# Patient Record
Sex: Female | Born: 1950
Health system: Southern US, Community
[De-identification: ages and names within clinical notes are randomized; demographics above are authoritative.]

## PROBLEM LIST (undated history)

## (undated) DIAGNOSIS — R0600 Dyspnea, unspecified: Secondary | ICD-10-CM

## (undated) DIAGNOSIS — E049 Nontoxic goiter, unspecified: Secondary | ICD-10-CM

## (undated) DIAGNOSIS — D649 Anemia, unspecified: Secondary | ICD-10-CM

## (undated) DIAGNOSIS — E119 Type 2 diabetes mellitus without complications: Secondary | ICD-10-CM

## (undated) DIAGNOSIS — E785 Hyperlipidemia, unspecified: Secondary | ICD-10-CM

## (undated) DIAGNOSIS — F419 Anxiety disorder, unspecified: Secondary | ICD-10-CM

## (undated) DIAGNOSIS — G4700 Insomnia, unspecified: Secondary | ICD-10-CM

## (undated) DIAGNOSIS — I219 Acute myocardial infarction, unspecified: Secondary | ICD-10-CM

## (undated) DIAGNOSIS — J9600 Acute respiratory failure, unspecified whether with hypoxia or hypercapnia: Secondary | ICD-10-CM

## (undated) DIAGNOSIS — J440 Chronic obstructive pulmonary disease with acute lower respiratory infection: Secondary | ICD-10-CM

## (undated) DIAGNOSIS — M86179 Other acute osteomyelitis, unspecified ankle and foot: Secondary | ICD-10-CM

## (undated) DIAGNOSIS — E1165 Type 2 diabetes mellitus with hyperglycemia: Secondary | ICD-10-CM

## (undated) DIAGNOSIS — I25119 Atherosclerotic heart disease of native coronary artery with unspecified angina pectoris: Secondary | ICD-10-CM

## (undated) DIAGNOSIS — J189 Pneumonia, unspecified organism: Secondary | ICD-10-CM

## (undated) DIAGNOSIS — N951 Menopausal and female climacteric states: Secondary | ICD-10-CM

## (undated) DIAGNOSIS — J441 Chronic obstructive pulmonary disease with (acute) exacerbation: Secondary | ICD-10-CM

## (undated) DIAGNOSIS — F32A Depression, unspecified: Secondary | ICD-10-CM

## (undated) DIAGNOSIS — I214 Non-ST elevation (NSTEMI) myocardial infarction: Secondary | ICD-10-CM

## (undated) DIAGNOSIS — M792 Neuralgia and neuritis, unspecified: Secondary | ICD-10-CM

## (undated) DIAGNOSIS — F1910 Other psychoactive substance abuse, uncomplicated: Secondary | ICD-10-CM

## (undated) DIAGNOSIS — N3281 Overactive bladder: Secondary | ICD-10-CM

## (undated) DIAGNOSIS — J209 Acute bronchitis, unspecified: Secondary | ICD-10-CM

## (undated) DIAGNOSIS — J449 Chronic obstructive pulmonary disease, unspecified: Secondary | ICD-10-CM

## (undated) DIAGNOSIS — F329 Major depressive disorder, single episode, unspecified: Secondary | ICD-10-CM

## (undated) DIAGNOSIS — M549 Dorsalgia, unspecified: Secondary | ICD-10-CM

## (undated) DIAGNOSIS — M545 Low back pain, unspecified: Secondary | ICD-10-CM

## (undated) DIAGNOSIS — I2510 Atherosclerotic heart disease of native coronary artery without angina pectoris: Secondary | ICD-10-CM

## (undated) DIAGNOSIS — I119 Hypertensive heart disease without heart failure: Secondary | ICD-10-CM

## (undated) DIAGNOSIS — K219 Gastro-esophageal reflux disease without esophagitis: Secondary | ICD-10-CM

## (undated) DIAGNOSIS — M1990 Unspecified osteoarthritis, unspecified site: Secondary | ICD-10-CM

## (undated) DIAGNOSIS — F17200 Nicotine dependence, unspecified, uncomplicated: Secondary | ICD-10-CM

## (undated) DIAGNOSIS — I509 Heart failure, unspecified: Secondary | ICD-10-CM

## (undated) DIAGNOSIS — R0902 Hypoxemia: Secondary | ICD-10-CM

## (undated) DIAGNOSIS — I10 Essential (primary) hypertension: Secondary | ICD-10-CM

## (undated) DIAGNOSIS — J40 Bronchitis, not specified as acute or chronic: Secondary | ICD-10-CM

## (undated) DIAGNOSIS — N816 Rectocele: Secondary | ICD-10-CM

## (undated) DIAGNOSIS — E1151 Type 2 diabetes mellitus with diabetic peripheral angiopathy without gangrene: Secondary | ICD-10-CM

## (undated) DIAGNOSIS — R569 Unspecified convulsions: Secondary | ICD-10-CM

## (undated) DIAGNOSIS — E039 Hypothyroidism, unspecified: Secondary | ICD-10-CM

## (undated) DIAGNOSIS — R42 Dizziness and giddiness: Secondary | ICD-10-CM

## (undated) DIAGNOSIS — R609 Edema, unspecified: Secondary | ICD-10-CM

## (undated) DIAGNOSIS — I255 Ischemic cardiomyopathy: Secondary | ICD-10-CM

## (undated) HISTORY — DX: Insomnia, unspecified: G47.00

## (undated) HISTORY — DX: Depression, unspecified: F32.A

## (undated) HISTORY — DX: Hyperlipidemia, unspecified: E78.5

## (undated) HISTORY — DX: Hypoxemia: R09.02

## (undated) HISTORY — DX: Acute bronchitis, unspecified: J20.9

## (undated) HISTORY — DX: Chronic obstructive pulmonary disease, unspecified: J44.9

## (undated) HISTORY — DX: Unspecified convulsions: R56.9

## (undated) HISTORY — DX: Major depressive disorder, single episode, unspecified: F32.9

## (undated) HISTORY — DX: Hypertensive heart disease without heart failure: I11.9

## (undated) HISTORY — DX: Menopausal and female climacteric states: N95.1

## (undated) HISTORY — DX: Chronic obstructive pulmonary disease with (acute) exacerbation: J44.1

## (undated) HISTORY — DX: Chronic obstructive pulmonary disease with (acute) lower respiratory infection: J44.0

## (undated) HISTORY — DX: Type 2 diabetes mellitus without complications: E11.9

## (undated) HISTORY — DX: Dizziness and giddiness: R42

## (undated) HISTORY — DX: Hypothyroidism, unspecified: E03.9

## (undated) HISTORY — DX: Anxiety disorder, unspecified: F41.9

## (undated) HISTORY — DX: Type 2 diabetes mellitus with hyperglycemia: E11.65

## (undated) HISTORY — DX: Overactive bladder: N32.81

## (undated) HISTORY — DX: Type 2 diabetes mellitus with diabetic peripheral angiopathy without gangrene: E11.51

## (undated) HISTORY — DX: Neuralgia and neuritis, unspecified: M79.2

## (undated) HISTORY — DX: Unspecified osteoarthritis, unspecified site: M19.90

## (undated) HISTORY — DX: Edema, unspecified: R60.9

## (undated) HISTORY — DX: Low back pain, unspecified: M54.50

## (undated) HISTORY — DX: Rectocele: N81.6

## (undated) HISTORY — DX: Other psychoactive substance abuse, uncomplicated: F19.10

## (undated) HISTORY — DX: Atherosclerotic heart disease of native coronary artery with unspecified angina pectoris: I25.119

## (undated) HISTORY — DX: Other acute osteomyelitis, unspecified ankle and foot: M86.179

## (undated) HISTORY — PX: TUBAL LIGATION: SHX77

## (undated) HISTORY — DX: Nicotine dependence, unspecified, uncomplicated: F17.200

---
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
## 2002-07-27 ENCOUNTER — Emergency Department (HOSPITAL_COMMUNITY): Admission: EM | Admit: 2002-07-27 | Discharge: 2002-07-27 | Payer: Self-pay | Admitting: Emergency Medicine

## 2004-09-18 ENCOUNTER — Emergency Department (HOSPITAL_COMMUNITY): Admission: EM | Admit: 2004-09-18 | Discharge: 2004-09-18 | Payer: Self-pay | Admitting: Emergency Medicine

## 2006-11-24 ENCOUNTER — Emergency Department (HOSPITAL_COMMUNITY): Admission: EM | Admit: 2006-11-24 | Discharge: 2006-11-24 | Payer: Self-pay | Admitting: Emergency Medicine

## 2008-08-11 ENCOUNTER — Emergency Department (HOSPITAL_COMMUNITY): Admission: EM | Admit: 2008-08-11 | Discharge: 2008-08-11 | Payer: Self-pay | Admitting: Emergency Medicine

## 2008-09-24 ENCOUNTER — Emergency Department (HOSPITAL_COMMUNITY): Admission: EM | Admit: 2008-09-24 | Discharge: 2008-09-25 | Payer: Self-pay | Admitting: Emergency Medicine

## 2008-09-30 ENCOUNTER — Ambulatory Visit (HOSPITAL_COMMUNITY): Admission: RE | Admit: 2008-09-30 | Discharge: 2008-09-30 | Payer: Self-pay | Admitting: Family Medicine

## 2009-01-14 ENCOUNTER — Emergency Department (HOSPITAL_COMMUNITY): Admission: EM | Admit: 2009-01-14 | Discharge: 2009-01-15 | Payer: Self-pay | Admitting: Emergency Medicine

## 2009-01-25 ENCOUNTER — Emergency Department (HOSPITAL_COMMUNITY): Admission: EM | Admit: 2009-01-25 | Discharge: 2009-01-25 | Payer: Self-pay | Admitting: Emergency Medicine

## 2009-03-03 ENCOUNTER — Emergency Department (HOSPITAL_COMMUNITY): Admission: EM | Admit: 2009-03-03 | Discharge: 2009-03-03 | Payer: Self-pay | Admitting: Emergency Medicine

## 2009-03-19 ENCOUNTER — Emergency Department (HOSPITAL_COMMUNITY): Admission: EM | Admit: 2009-03-19 | Discharge: 2009-03-19 | Payer: Self-pay | Admitting: Emergency Medicine

## 2009-10-02 ENCOUNTER — Ambulatory Visit (HOSPITAL_COMMUNITY): Admission: RE | Admit: 2009-10-02 | Discharge: 2009-10-02 | Payer: Self-pay | Admitting: Family Medicine

## 2009-10-27 ENCOUNTER — Emergency Department (HOSPITAL_COMMUNITY): Admission: EM | Admit: 2009-10-27 | Discharge: 2009-10-27 | Payer: Self-pay | Admitting: Emergency Medicine

## 2009-11-03 ENCOUNTER — Emergency Department (HOSPITAL_COMMUNITY): Admission: EM | Admit: 2009-11-03 | Discharge: 2009-11-03 | Payer: Self-pay | Admitting: Emergency Medicine

## 2009-12-31 ENCOUNTER — Emergency Department (HOSPITAL_COMMUNITY): Admission: EM | Admit: 2009-12-31 | Discharge: 2009-12-31 | Payer: Self-pay | Admitting: Emergency Medicine

## 2010-01-08 ENCOUNTER — Inpatient Hospital Stay (HOSPITAL_COMMUNITY): Admission: EM | Admit: 2010-01-08 | Discharge: 2010-01-11 | Payer: Self-pay | Admitting: Emergency Medicine

## 2010-01-08 ENCOUNTER — Encounter: Payer: Self-pay | Admitting: Internal Medicine

## 2010-01-08 LAB — CONVERTED CEMR LAB
BUN: 12 mg/dL
Basophils Relative: 0 %
CO2: 27 meq/L
Chloride: 104 meq/L
Creatinine, Ser: 0.98 mg/dL
Eosinophils Relative: 1 %
Glucose, Bld: 197 mg/dL
HCT: 40.2 %
Hemoglobin: 13.5 g/dL
Lymphocytes, automated: 23 %
MCV: 86.9 fL
Monocytes Relative: 7 %
Neutrophils Relative %: 69 %
Platelets: 176 10*3/uL
Potassium: 3 meq/L
RBC: 4.63 M/uL
RDW: 14.3 %
Sodium: 137 meq/L
WBC: 7.9 10*3/uL

## 2010-01-09 ENCOUNTER — Encounter: Payer: Self-pay | Admitting: Internal Medicine

## 2010-01-09 LAB — CONVERTED CEMR LAB
BUN: 9 mg/dL
Basophils Relative: 0 %
CO2: 20 meq/L
Calcium: 8.3 mg/dL
Chloride: 20 meq/L
Creatinine, Ser: 0.96 mg/dL
Eosinophils Relative: 0 %
Glucose, Bld: 214 mg/dL
HCT: 38 %
Hemoglobin: 12.9 g/dL
Hgb A1c MFr Bld: 5.7 %
Lymphocytes, automated: 6 %
MCV: 87.2 fL
Monocytes Relative: 1 %
Neutrophils Relative %: 93 %
Platelets: 188 10*3/uL
Potassium: 3.1 meq/L
RBC: 4.36 M/uL
RDW: 14.4 %
Sodium: 135 meq/L
TSH: 0.982 microintl units/mL
WBC: 7.9 10*3/uL

## 2010-01-10 ENCOUNTER — Encounter: Payer: Self-pay | Admitting: Internal Medicine

## 2010-01-10 LAB — CONVERTED CEMR LAB
BUN: 8 mg/dL
Basophils Relative: 0 %
CO2: 24 meq/L
Calcium: 8.9 mg/dL
Chloride: 108 meq/L
Creatinine, Ser: 0.78 mg/dL
Eosinophils Relative: 0 %
Glucose, Bld: 162 mg/dL
HCT: 38.9 %
Hemoglobin: 13 g/dL
Lymphocytes, automated: 6 %
MCV: 87.6 fL
Monocytes Relative: 2 %
Neutrophils Relative %: 93 %
Platelets: 188 10*3/uL
Potassium: 4.6 meq/L
RBC: 4.44 M/uL
RDW: 14.8 %
Sodium: 138 meq/L
WBC: 14.6 10*3/uL

## 2010-02-04 ENCOUNTER — Encounter: Payer: Self-pay | Admitting: Internal Medicine

## 2010-02-04 DIAGNOSIS — E876 Hypokalemia: Secondary | ICD-10-CM | POA: Insufficient documentation

## 2010-02-04 DIAGNOSIS — G8929 Other chronic pain: Secondary | ICD-10-CM | POA: Insufficient documentation

## 2010-02-04 DIAGNOSIS — J439 Emphysema, unspecified: Secondary | ICD-10-CM | POA: Insufficient documentation

## 2010-02-04 DIAGNOSIS — F3289 Other specified depressive episodes: Secondary | ICD-10-CM | POA: Insufficient documentation

## 2010-02-04 DIAGNOSIS — F329 Major depressive disorder, single episode, unspecified: Secondary | ICD-10-CM | POA: Insufficient documentation

## 2010-02-04 DIAGNOSIS — F411 Generalized anxiety disorder: Secondary | ICD-10-CM | POA: Insufficient documentation

## 2010-02-04 DIAGNOSIS — F32A Depression, unspecified: Secondary | ICD-10-CM | POA: Insufficient documentation

## 2010-03-20 ENCOUNTER — Emergency Department (HOSPITAL_COMMUNITY): Admission: EM | Admit: 2010-03-20 | Discharge: 2010-03-20 | Payer: Self-pay | Admitting: Emergency Medicine

## 2010-06-19 ENCOUNTER — Emergency Department (HOSPITAL_COMMUNITY)
Admission: EM | Admit: 2010-06-19 | Discharge: 2010-06-19 | Disposition: A | Discharge status: Home / Self Care | Payer: Medicare Other | Attending: Emergency Medicine | Admitting: Emergency Medicine

## 2010-06-19 DIAGNOSIS — F17200 Nicotine dependence, unspecified, uncomplicated: Secondary | ICD-10-CM | POA: Insufficient documentation

## 2010-06-19 DIAGNOSIS — E7800 Pure hypercholesterolemia, unspecified: Secondary | ICD-10-CM | POA: Insufficient documentation

## 2010-06-19 DIAGNOSIS — J4489 Other specified chronic obstructive pulmonary disease: Secondary | ICD-10-CM | POA: Insufficient documentation

## 2010-06-19 DIAGNOSIS — J449 Chronic obstructive pulmonary disease, unspecified: Secondary | ICD-10-CM | POA: Insufficient documentation

## 2010-06-19 DIAGNOSIS — F410 Panic disorder [episodic paroxysmal anxiety] without agoraphobia: Secondary | ICD-10-CM | POA: Insufficient documentation

## 2010-07-20 LAB — URINE CULTURE
Colony Count: 4000
Culture  Setup Time: 201111121943

## 2010-07-20 LAB — BASIC METABOLIC PANEL
BUN: 9 mg/dL (ref 6–23)
CO2: 27 mEq/L (ref 19–32)
Calcium: 9.1 mg/dL (ref 8.4–10.5)
Chloride: 103 mEq/L (ref 96–112)
Creatinine, Ser: 0.79 mg/dL (ref 0.4–1.2)
GFR calc Af Amer: >60 mL/min (ref >60)
GFR calc non Af Amer: >60 mL/min (ref >60)
Glucose, Bld: 103 mg/dL — ABNORMAL HIGH (ref 70–99)
Potassium: 3.7 mEq/L (ref 3.5–5.1)
Sodium: 138 mEq/L (ref 135–145)

## 2010-07-20 LAB — CBC
HCT: 41.6 % (ref 36.0–46.0)
Hemoglobin: 13.9 g/dL (ref 12.0–15.0)
MCH: 29.1 pg (ref 26.0–34.0)
MCHC: 33.4 g/dL (ref 30.0–36.0)
MCV: 86.9 fL (ref 78.0–100.0)
Platelets: 255 10*3/uL (ref 150–400)
RBC: 4.79 MIL/uL (ref 3.87–5.11)
RDW: 15.1 % (ref 11.5–15.5)
WBC: 7.5 10*3/uL (ref 4.0–10.5)

## 2010-07-20 LAB — URINALYSIS, ROUTINE W REFLEX MICROSCOPIC
Bilirubin Urine: NEGATIVE
Glucose, UA: NEGATIVE mg/dL
Ketones, ur: NEGATIVE mg/dL
Leukocytes, UA: NEGATIVE
Nitrite: NEGATIVE
Protein, ur: NEGATIVE mg/dL
Specific Gravity, Urine: 1.01 (ref 1.005–1.030)
Urobilinogen, UA: 0.2 mg/dL (ref 0.0–1.0)
pH: 5.5 (ref 5.0–8.0)

## 2010-07-20 LAB — URINE MICROSCOPIC-ADD ON

## 2010-07-20 LAB — GLUCOSE, CAPILLARY: Glucose-Capillary: 89 mg/dL (ref 70–99)

## 2010-07-20 LAB — DIFFERENTIAL
Basophils Absolute: 0 10*3/uL (ref 0.0–0.1)
Basophils Relative: 0 % (ref 0–1)
Eosinophils Absolute: 0.1 10*3/uL (ref 0.0–0.7)
Eosinophils Relative: 2 % (ref 0–5)
Lymphocytes Relative: 33 % (ref 12–46)
Lymphs Abs: 2.5 10*3/uL (ref 0.7–4.0)
Monocytes Absolute: 0.4 10*3/uL (ref 0.1–1.0)
Monocytes Relative: 6 % (ref 3–12)
Neutro Abs: 4.5 10*3/uL (ref 1.7–7.7)
Neutrophils Relative %: 60 % (ref 43–77)

## 2010-07-20 LAB — POCT CARDIAC MARKERS
CKMB, poc: <1 ng/mL — ABNORMAL LOW (ref 1.0–8.0)
Myoglobin, poc: 33.3 ng/mL (ref 12–200)
Troponin i, poc: <0.05 ng/mL (ref 0.00–0.09)

## 2010-07-21 ENCOUNTER — Emergency Department (HOSPITAL_COMMUNITY)
Admission: EM | Admit: 2010-07-21 | Discharge: 2010-07-21 | Disposition: A | Discharge status: Home / Self Care | Payer: Medicare Other | Attending: Emergency Medicine | Admitting: Emergency Medicine

## 2010-07-21 DIAGNOSIS — F3289 Other specified depressive episodes: Secondary | ICD-10-CM | POA: Insufficient documentation

## 2010-07-21 DIAGNOSIS — F329 Major depressive disorder, single episode, unspecified: Secondary | ICD-10-CM | POA: Insufficient documentation

## 2010-07-21 DIAGNOSIS — Z79899 Other long term (current) drug therapy: Secondary | ICD-10-CM | POA: Insufficient documentation

## 2010-07-21 DIAGNOSIS — E7800 Pure hypercholesterolemia, unspecified: Secondary | ICD-10-CM | POA: Insufficient documentation

## 2010-07-21 DIAGNOSIS — J45901 Unspecified asthma with (acute) exacerbation: Secondary | ICD-10-CM | POA: Insufficient documentation

## 2010-07-22 LAB — DIFFERENTIAL
Basophils Absolute: 0 10*3/uL (ref 0.0–0.1)
Basophils Absolute: 0 10*3/uL (ref 0.0–0.1)
Basophils Absolute: 0 10*3/uL (ref 0.0–0.1)
Basophils Relative: 0 % (ref 0–1)
Basophils Relative: 0 % (ref 0–1)
Basophils Relative: 0 % (ref 0–1)
Eosinophils Absolute: 0 10*3/uL (ref 0.0–0.7)
Eosinophils Absolute: 0 10*3/uL (ref 0.0–0.7)
Eosinophils Absolute: 0 10*3/uL (ref 0.0–0.7)
Eosinophils Relative: 0 % (ref 0–5)
Eosinophils Relative: 0 % (ref 0–5)
Eosinophils Relative: 1 % (ref 0–5)
Lymphocytes Relative: 23 % (ref 12–46)
Lymphocytes Relative: 6 % — ABNORMAL LOW (ref 12–46)
Lymphocytes Relative: 6 % — ABNORMAL LOW (ref 12–46)
Lymphs Abs: 0.5 10*3/uL — ABNORMAL LOW (ref 0.7–4.0)
Lymphs Abs: 0.8 10*3/uL (ref 0.7–4.0)
Lymphs Abs: 1.8 10*3/uL (ref 0.7–4.0)
Monocytes Absolute: 0.1 10*3/uL (ref 0.1–1.0)
Monocytes Absolute: 0.2 10*3/uL (ref 0.1–1.0)
Monocytes Absolute: 0.5 10*3/uL (ref 0.1–1.0)
Monocytes Relative: 1 % — ABNORMAL LOW (ref 3–12)
Monocytes Relative: 2 % — ABNORMAL LOW (ref 3–12)
Monocytes Relative: 7 % (ref 3–12)
Neutro Abs: 13.6 10*3/uL — ABNORMAL HIGH (ref 1.7–7.7)
Neutro Abs: 5.5 10*3/uL (ref 1.7–7.7)
Neutro Abs: 7.4 10*3/uL (ref 1.7–7.7)
Neutrophils Relative %: 69 % (ref 43–77)
Neutrophils Relative %: 93 % — ABNORMAL HIGH (ref 43–77)
Neutrophils Relative %: 93 % — ABNORMAL HIGH (ref 43–77)

## 2010-07-22 LAB — CBC
HCT: 38 % (ref 36.0–46.0)
HCT: 38.9 % (ref 36.0–46.0)
HCT: 40.2 % (ref 36.0–46.0)
Hemoglobin: 12.9 g/dL (ref 12.0–15.0)
Hemoglobin: 13 g/dL (ref 12.0–15.0)
Hemoglobin: 13.5 g/dL (ref 12.0–15.0)
MCH: 29 pg (ref 26.0–34.0)
MCH: 29.2 pg (ref 26.0–34.0)
MCH: 29.5 pg (ref 26.0–34.0)
MCHC: 33.3 g/dL (ref 30.0–36.0)
MCHC: 33.4 g/dL (ref 30.0–36.0)
MCHC: 33.9 g/dL (ref 30.0–36.0)
MCV: 86.9 fL (ref 78.0–100.0)
MCV: 87.2 fL (ref 78.0–100.0)
MCV: 87.6 fL (ref 78.0–100.0)
Platelets: 176 10*3/uL (ref 150–400)
Platelets: 188 10*3/uL (ref 150–400)
Platelets: 188 10*3/uL (ref 150–400)
RBC: 4.36 MIL/uL (ref 3.87–5.11)
RBC: 4.44 MIL/uL (ref 3.87–5.11)
RBC: 4.63 MIL/uL (ref 3.87–5.11)
RDW: 14.3 % (ref 11.5–15.5)
RDW: 14.4 % (ref 11.5–15.5)
RDW: 14.8 % (ref 11.5–15.5)
WBC: 14.6 10*3/uL — ABNORMAL HIGH (ref 4.0–10.5)
WBC: 7.9 10*3/uL (ref 4.0–10.5)
WBC: 7.9 10*3/uL (ref 4.0–10.5)

## 2010-07-22 LAB — TSH: TSH: 0.982 u[IU]/mL (ref 0.350–4.500)

## 2010-07-22 LAB — GLUCOSE, CAPILLARY
Glucose-Capillary: 107 mg/dL — ABNORMAL HIGH (ref 70–99)
Glucose-Capillary: 112 mg/dL — ABNORMAL HIGH (ref 70–99)
Glucose-Capillary: 144 mg/dL — ABNORMAL HIGH (ref 70–99)
Glucose-Capillary: 154 mg/dL — ABNORMAL HIGH (ref 70–99)
Glucose-Capillary: 165 mg/dL — ABNORMAL HIGH (ref 70–99)
Glucose-Capillary: 177 mg/dL — ABNORMAL HIGH (ref 70–99)
Glucose-Capillary: 211 mg/dL — ABNORMAL HIGH (ref 70–99)
Glucose-Capillary: 232 mg/dL — ABNORMAL HIGH (ref 70–99)
Glucose-Capillary: 290 mg/dL — ABNORMAL HIGH (ref 70–99)

## 2010-07-22 LAB — BASIC METABOLIC PANEL
BUN: 12 mg/dL (ref 6–23)
BUN: 8 mg/dL (ref 6–23)
BUN: 9 mg/dL (ref 6–23)
CO2: 20 mEq/L (ref 19–32)
CO2: 24 mEq/L (ref 19–32)
CO2: 27 mEq/L (ref 19–32)
Calcium: 8.3 mg/dL — ABNORMAL LOW (ref 8.4–10.5)
Calcium: 8.8 mg/dL (ref 8.4–10.5)
Calcium: 8.9 mg/dL (ref 8.4–10.5)
Chloride: 102 mEq/L (ref 96–112)
Chloride: 104 mEq/L (ref 96–112)
Chloride: 108 mEq/L (ref 96–112)
Creatinine, Ser: 0.78 mg/dL (ref 0.4–1.2)
Creatinine, Ser: 0.96 mg/dL (ref 0.4–1.2)
Creatinine, Ser: 0.98 mg/dL (ref 0.4–1.2)
GFR calc Af Amer: >60 mL/min (ref >60)
GFR calc Af Amer: >60 mL/min (ref >60)
GFR calc Af Amer: >60 mL/min (ref >60)
GFR calc non Af Amer: 59 mL/min — ABNORMAL LOW (ref >60)
GFR calc non Af Amer: 60 mL/min — ABNORMAL LOW (ref >60)
GFR calc non Af Amer: >60 mL/min (ref >60)
Glucose, Bld: 162 mg/dL — ABNORMAL HIGH (ref 70–99)
Glucose, Bld: 197 mg/dL — ABNORMAL HIGH (ref 70–99)
Glucose, Bld: 214 mg/dL — ABNORMAL HIGH (ref 70–99)
Potassium: 3 mEq/L — ABNORMAL LOW (ref 3.5–5.1)
Potassium: 3.1 mEq/L — ABNORMAL LOW (ref 3.5–5.1)
Potassium: 4.6 mEq/L (ref 3.5–5.1)
Sodium: 135 mEq/L (ref 135–145)
Sodium: 137 mEq/L (ref 135–145)
Sodium: 138 mEq/L (ref 135–145)

## 2010-07-22 LAB — MRSA PCR SCREENING: MRSA by PCR: NEGATIVE

## 2010-07-22 LAB — MAGNESIUM: Magnesium: 1.8 mg/dL (ref 1.5–2.5)

## 2010-07-22 LAB — HEMOGLOBIN A1C
Hgb A1c MFr Bld: 5.7 % — ABNORMAL HIGH (ref <5.7)
Mean Plasma Glucose: 117 mg/dL — ABNORMAL HIGH (ref <117)

## 2010-08-12 LAB — GLUCOSE, CAPILLARY: Glucose-Capillary: 113 mg/dL — ABNORMAL HIGH (ref 70–99)

## 2010-08-13 LAB — URINALYSIS, ROUTINE W REFLEX MICROSCOPIC
Bilirubin Urine: NEGATIVE
Glucose, UA: NEGATIVE mg/dL
Ketones, ur: NEGATIVE mg/dL
Leukocytes, UA: NEGATIVE
Nitrite: NEGATIVE
Protein, ur: NEGATIVE mg/dL
Specific Gravity, Urine: 1.01 (ref 1.005–1.030)
Urobilinogen, UA: 0.2 mg/dL (ref 0.0–1.0)
pH: 6 (ref 5.0–8.0)

## 2010-08-13 LAB — URINE MICROSCOPIC-ADD ON

## 2010-08-13 LAB — URINE CULTURE
Colony Count: NO GROWTH
Culture: NO GROWTH

## 2010-08-18 LAB — COMPREHENSIVE METABOLIC PANEL
ALT: 17 U/L (ref 0–35)
AST: 13 U/L (ref 0–37)
Albumin: 3.7 g/dL (ref 3.5–5.2)
Alkaline Phosphatase: 90 U/L (ref 39–117)
BUN: 11 mg/dL (ref 6–23)
CO2: 27 mEq/L (ref 19–32)
Calcium: 9.1 mg/dL (ref 8.4–10.5)
Chloride: 104 mEq/L (ref 96–112)
Creatinine, Ser: 0.76 mg/dL (ref 0.4–1.2)
GFR calc Af Amer: >60 mL/min (ref >60)
GFR calc non Af Amer: >60 mL/min (ref >60)
Glucose, Bld: 93 mg/dL (ref 70–99)
Potassium: 3.9 mEq/L (ref 3.5–5.1)
Sodium: 137 mEq/L (ref 135–145)
Total Bilirubin: 0.6 mg/dL (ref 0.3–1.2)
Total Protein: 6.9 g/dL (ref 6.0–8.3)

## 2010-08-18 LAB — CBC
HCT: 43.7 % (ref 36.0–46.0)
Hemoglobin: 15 g/dL (ref 12.0–15.0)
MCHC: 34.4 g/dL (ref 30.0–36.0)
MCV: 89.7 fL (ref 78.0–100.0)
Platelets: 226 10*3/uL (ref 150–400)
RBC: 4.88 MIL/uL (ref 3.87–5.11)
RDW: 14 % (ref 11.5–15.5)
WBC: 6.9 10*3/uL (ref 4.0–10.5)

## 2010-08-18 LAB — GC/CHLAMYDIA PROBE AMP, GENITAL
Chlamydia, DNA Probe: NEGATIVE
GC Probe Amp, Genital: NEGATIVE

## 2010-08-18 LAB — URINALYSIS, ROUTINE W REFLEX MICROSCOPIC
Bilirubin Urine: NEGATIVE
Glucose, UA: NEGATIVE mg/dL
Ketones, ur: NEGATIVE mg/dL
Leukocytes, UA: NEGATIVE
Nitrite: NEGATIVE
Protein, ur: NEGATIVE mg/dL
Specific Gravity, Urine: 1.005 (ref 1.005–1.030)
Urobilinogen, UA: 0.2 mg/dL (ref 0.0–1.0)
pH: 5.5 (ref 5.0–8.0)

## 2010-08-18 LAB — DIFFERENTIAL
Basophils Absolute: 0 10*3/uL (ref 0.0–0.1)
Basophils Relative: 0 % (ref 0–1)
Eosinophils Absolute: 0.1 10*3/uL (ref 0.0–0.7)
Eosinophils Relative: 2 % (ref 0–5)
Lymphocytes Relative: 36 % (ref 12–46)
Lymphs Abs: 2.4 10*3/uL (ref 0.7–4.0)
Monocytes Absolute: 0.4 10*3/uL (ref 0.1–1.0)
Monocytes Relative: 5 % (ref 3–12)
Neutro Abs: 3.9 10*3/uL (ref 1.7–7.7)
Neutrophils Relative %: 57 % (ref 43–77)

## 2010-08-18 LAB — WET PREP, GENITAL
Clue Cells Wet Prep HPF POC: NONE SEEN
Yeast Wet Prep HPF POC: NONE SEEN

## 2010-08-18 LAB — URINE MICROSCOPIC-ADD ON

## 2010-09-30 ENCOUNTER — Other Ambulatory Visit: Payer: Self-pay | Admitting: Orthopedic Surgery

## 2010-09-30 DIAGNOSIS — M549 Dorsalgia, unspecified: Secondary | ICD-10-CM

## 2010-10-08 ENCOUNTER — Inpatient Hospital Stay: Admission: RE | Admit: 2010-10-08 | Payer: Medicare Other | Source: Ambulatory Visit

## 2010-10-08 ENCOUNTER — Ambulatory Visit
Admission: RE | Admit: 2010-10-08 | Discharge: 2010-10-08 | Disposition: A | Discharge status: Home / Self Care | Payer: Medicare Other | Source: Ambulatory Visit | Attending: Orthopedic Surgery | Admitting: Orthopedic Surgery

## 2010-10-08 DIAGNOSIS — M549 Dorsalgia, unspecified: Secondary | ICD-10-CM

## 2010-10-26 ENCOUNTER — Other Ambulatory Visit (HOSPITAL_COMMUNITY): Payer: Self-pay | Admitting: Internal Medicine

## 2010-11-02 ENCOUNTER — Emergency Department (HOSPITAL_COMMUNITY): Payer: Medicare Other

## 2010-11-02 ENCOUNTER — Emergency Department (HOSPITAL_COMMUNITY)
Admission: EM | Admit: 2010-11-02 | Discharge: 2010-11-02 | Disposition: A | Discharge status: Home / Self Care | Payer: Medicare Other | Attending: Emergency Medicine | Admitting: Emergency Medicine

## 2010-11-02 DIAGNOSIS — R05 Cough: Secondary | ICD-10-CM | POA: Insufficient documentation

## 2010-11-02 DIAGNOSIS — R059 Cough, unspecified: Secondary | ICD-10-CM | POA: Insufficient documentation

## 2010-11-02 DIAGNOSIS — J40 Bronchitis, not specified as acute or chronic: Secondary | ICD-10-CM | POA: Insufficient documentation

## 2010-11-02 DIAGNOSIS — Z79899 Other long term (current) drug therapy: Secondary | ICD-10-CM | POA: Insufficient documentation

## 2010-11-02 LAB — BASIC METABOLIC PANEL
BUN: 14 mg/dL (ref 6–23)
CO2: 28 mEq/L (ref 19–32)
Calcium: 9.8 mg/dL (ref 8.4–10.5)
Chloride: 104 mEq/L (ref 96–112)
Creatinine, Ser: 0.82 mg/dL (ref 0.50–1.10)
GFR calc Af Amer: >60 mL/min (ref >60)
GFR calc non Af Amer: >60 mL/min (ref >60)
Glucose, Bld: 98 mg/dL (ref 70–99)
Potassium: 3.5 mEq/L (ref 3.5–5.1)
Sodium: 141 mEq/L (ref 135–145)

## 2010-11-02 LAB — DIFFERENTIAL
Basophils Absolute: 0 10*3/uL (ref 0.0–0.1)
Basophils Relative: 0 % (ref 0–1)
Eosinophils Absolute: 0.6 10*3/uL (ref 0.0–0.7)
Eosinophils Relative: 8 % — ABNORMAL HIGH (ref 0–5)
Lymphocytes Relative: 40 % (ref 12–46)
Lymphs Abs: 2.8 10*3/uL (ref 0.7–4.0)
Monocytes Absolute: 0.3 10*3/uL (ref 0.1–1.0)
Monocytes Relative: 5 % (ref 3–12)
Neutro Abs: 3.3 10*3/uL (ref 1.7–7.7)
Neutrophils Relative %: 47 % (ref 43–77)

## 2010-11-02 LAB — CBC
HCT: 43.4 % (ref 36.0–46.0)
Hemoglobin: 14.6 g/dL (ref 12.0–15.0)
MCH: 29.7 pg (ref 26.0–34.0)
MCHC: 33.6 g/dL (ref 30.0–36.0)
MCV: 88.2 fL (ref 78.0–100.0)
Platelets: 205 10*3/uL (ref 150–400)
RBC: 4.92 MIL/uL (ref 3.87–5.11)
RDW: 14.4 % (ref 11.5–15.5)
WBC: 7.1 10*3/uL (ref 4.0–10.5)

## 2010-11-16 ENCOUNTER — Emergency Department (HOSPITAL_COMMUNITY)
Admission: EM | Admit: 2010-11-16 | Discharge: 2010-11-17 | Disposition: A | Discharge status: Home / Self Care | Payer: Medicare Other | Attending: Emergency Medicine | Admitting: Emergency Medicine

## 2010-11-16 ENCOUNTER — Emergency Department (HOSPITAL_COMMUNITY): Payer: Medicare Other

## 2010-11-16 ENCOUNTER — Encounter: Payer: Self-pay | Admitting: *Deleted

## 2010-11-16 DIAGNOSIS — Z87891 Personal history of nicotine dependence: Secondary | ICD-10-CM | POA: Insufficient documentation

## 2010-11-16 DIAGNOSIS — J449 Chronic obstructive pulmonary disease, unspecified: Secondary | ICD-10-CM

## 2010-11-16 DIAGNOSIS — J4489 Other specified chronic obstructive pulmonary disease: Secondary | ICD-10-CM | POA: Insufficient documentation

## 2010-11-16 HISTORY — DX: Chronic obstructive pulmonary disease, unspecified: J44.9

## 2010-11-16 LAB — BASIC METABOLIC PANEL
BUN: 8 mg/dL (ref 6–23)
CO2: 31 mEq/L (ref 19–32)
Calcium: 9.5 mg/dL (ref 8.4–10.5)
Chloride: 102 mEq/L (ref 96–112)
Creatinine, Ser: 0.85 mg/dL (ref 0.50–1.10)
GFR calc Af Amer: >60 mL/min (ref >60)
GFR calc non Af Amer: >60 mL/min (ref >60)
Glucose, Bld: 128 mg/dL — ABNORMAL HIGH (ref 70–99)
Potassium: 3.2 mEq/L — ABNORMAL LOW (ref 3.5–5.1)
Sodium: 140 mEq/L (ref 135–145)

## 2010-11-16 LAB — CBC
HCT: 43.1 % (ref 36.0–46.0)
Hemoglobin: 14.5 g/dL (ref 12.0–15.0)
MCH: 29 pg (ref 26.0–34.0)
MCHC: 33.6 g/dL (ref 30.0–36.0)
MCV: 86.2 fL (ref 78.0–100.0)
Platelets: 233 10*3/uL (ref 150–400)
RBC: 5 MIL/uL (ref 3.87–5.11)
RDW: 14.3 % (ref 11.5–15.5)
WBC: 11.6 10*3/uL — ABNORMAL HIGH (ref 4.0–10.5)

## 2010-11-16 LAB — DIFFERENTIAL
Basophils Absolute: 0 10*3/uL (ref 0.0–0.1)
Basophils Relative: 0 % (ref 0–1)
Eosinophils Absolute: 0.9 10*3/uL — ABNORMAL HIGH (ref 0.0–0.7)
Eosinophils Relative: 8 % — ABNORMAL HIGH (ref 0–5)
Lymphocytes Relative: 23 % (ref 12–46)
Lymphs Abs: 2.7 10*3/uL (ref 0.7–4.0)
Monocytes Absolute: 0.6 10*3/uL (ref 0.1–1.0)
Monocytes Relative: 5 % (ref 3–12)
Neutro Abs: 7.3 10*3/uL (ref 1.7–7.7)
Neutrophils Relative %: 63 % (ref 43–77)

## 2010-11-16 MED ORDER — ALBUTEROL SULFATE (2.5 MG/3ML) 0.083% IN NEBU
INHALATION_SOLUTION | RESPIRATORY_TRACT | Status: AC
  Administered 2010-11-16: 2.5 mg via RESPIRATORY_TRACT
  Filled 2010-11-16: qty 6

## 2010-11-16 MED ORDER — IPRATROPIUM BROMIDE 0.02 % IN SOLN
RESPIRATORY_TRACT | Status: AC
  Administered 2010-11-16: 0.5 mg via RESPIRATORY_TRACT
  Filled 2010-11-16: qty 2.5

## 2010-11-16 MED ORDER — ALBUTEROL SULFATE (5 MG/ML) 0.5% IN NEBU
2.5000 mg | INHALATION_SOLUTION | Freq: Once | RESPIRATORY_TRACT | Status: AC
  Administered 2010-11-16 – 2010-11-17 (×2): 2.5 mg via RESPIRATORY_TRACT

## 2010-11-16 MED ORDER — IPRATROPIUM BROMIDE 0.02 % IN SOLN
0.5000 mg | Freq: Once | RESPIRATORY_TRACT | Status: AC
  Administered 2010-11-16: 0.5 mg via RESPIRATORY_TRACT

## 2010-11-16 NOTE — ED Notes (Signed)
HHN in progress.  Alert, no chest pain

## 2010-11-17 MED ORDER — ALBUTEROL SULFATE (2.5 MG/3ML) 0.083% IN NEBU
INHALATION_SOLUTION | RESPIRATORY_TRACT | Status: AC
  Administered 2010-11-17: 2.5 mg via RESPIRATORY_TRACT
  Filled 2010-11-17: qty 6

## 2010-11-17 MED ORDER — HYDROCODONE-ACETAMINOPHEN 5-500 MG PO TABS: 1.0000 | ORAL_TABLET | Freq: Four times a day (QID) | ORAL | Status: AC | PRN

## 2010-11-17 MED ORDER — METHYLPREDNISOLONE SODIUM SUCC 125 MG IJ SOLR
125.0000 mg | Freq: Once | INTRAMUSCULAR | Status: AC
  Administered 2010-11-17: 125 mg via INTRAMUSCULAR
  Filled 2010-11-17: qty 2

## 2010-11-17 MED ORDER — ALBUTEROL SULFATE (5 MG/ML) 0.5% IN NEBU: 5.0000 mg | INHALATION_SOLUTION | Freq: Once | RESPIRATORY_TRACT | Status: DC

## 2010-11-17 MED ORDER — ALBUTEROL SULFATE (2.5 MG/3ML) 0.083% IN NEBU
5.0000 mg | INHALATION_SOLUTION | Freq: Once | RESPIRATORY_TRACT | Status: AC
  Administered 2010-11-17: 5 mg via RESPIRATORY_TRACT
  Filled 2010-11-17: qty 6

## 2010-11-17 MED ORDER — IPRATROPIUM BROMIDE 0.02 % IN SOLN
0.5000 mg | Freq: Once | RESPIRATORY_TRACT | Status: DC
  Filled 2010-11-17: qty 2.5

## 2010-11-17 MED ORDER — DOXYCYCLINE HYCLATE 100 MG PO TABS
100.0000 mg | ORAL_TABLET | Freq: Once | ORAL | Status: AC
  Administered 2010-11-17: 100 mg via ORAL
  Filled 2010-11-17: qty 1

## 2010-11-17 MED ORDER — IPRATROPIUM BROMIDE 0.02 % IN SOLN
0.5000 mg | Freq: Once | RESPIRATORY_TRACT | Status: AC
  Administered 2010-11-17: 0.5 mg via RESPIRATORY_TRACT
  Filled 2010-11-17: qty 2.5

## 2010-11-17 MED ORDER — IPRATROPIUM BROMIDE 0.02 % IN SOLN
0.5000 mg | Freq: Once | RESPIRATORY_TRACT | Status: AC
  Administered 2010-11-17: 0.5 mg via RESPIRATORY_TRACT

## 2010-11-17 MED ORDER — DOXYCYCLINE HYCLATE 100 MG PO CAPS: 100.0000 mg | ORAL_CAPSULE | Freq: Two times a day (BID) | ORAL | Status: AC

## 2010-11-17 MED ORDER — ALBUTEROL SULFATE (5 MG/ML) 0.5% IN NEBU
2.5000 mg | INHALATION_SOLUTION | Freq: Once | RESPIRATORY_TRACT | Status: AC
  Administered 2010-11-17: 2.5 mg via RESPIRATORY_TRACT

## 2010-11-17 NOTE — ED Provider Notes (Signed)
History     Chief Complaint  Patient presents with  . Shortness of Breath   HPI Comments: Patient with h/o COPD and asthma who has been having increased shortness of breath and wheezing since this morning when she walked outside.sShe has used her nebulizer at home without relief. She is currently on PO steroids. Denies fever, chills, chest pain, nausea, vomiting. Sputum increased without color.  Patient is a 60 y.o. female presenting with shortness of breath. The history is provided by the patient.  Shortness of Breath  The current episode started today. The problem has been gradually worsening. The problem is moderate. The symptoms are relieved by nothing. The symptoms are aggravated by activity and smoke exposure. Associated symptoms include orthopnea, cough, shortness of breath and wheezing. Pertinent negatives include no chest pain, no chest pressure, no fever, no rhinorrhea, no sore throat and no stridor. She is currently using steroids. She has had prior hospitalizations. She has had no prior ICU admissions. She has had no prior intubations. She has been behaving normally. Urine output has been normal.    Past Medical History  Diagnosis Date  . COPD (chronic obstructive pulmonary disease)   . Asthma     History reviewed. No pertinent past surgical history.  History reviewed. No pertinent family history.  History  Substance Use Topics  . Smoking status: Former Smoker    Quit date: 11/14/2010  . Smokeless tobacco: Not on file  . Alcohol Use: No    OB History    Grav Para Term Preterm Abortions TAB SAB Ect Mult Living                  Review of Systems  Constitutional: Negative for fever.  HENT: Negative for sore throat and rhinorrhea.   Respiratory: Positive for cough, shortness of breath and wheezing. Negative for stridor.   Cardiovascular: Positive for orthopnea. Negative for chest pain.  All other systems reviewed and are negative.    Physical Exam  BP 109/92   Pulse 92  Temp(Src) 97.8 F (36.6 C) (Oral)  Resp 20  SpO2 90%  Physical Exam  Nursing note and vitals reviewed. Constitutional: She is oriented to person, place, and time. She appears well-developed and well-nourished.  HENT:  Head: Normocephalic.  Right Ear: External ear normal.  Left Ear: External ear normal.  Nose: Nose normal.  Mouth/Throat: Oropharynx is clear and moist.  Eyes: Conjunctivae and EOM are normal. Pupils are equal, round, and reactive to light.  Neck: Normal range of motion. Neck supple. No JVD present.  Cardiovascular: Regular rhythm, normal heart sounds and intact distal pulses.   Pulmonary/Chest: No respiratory distress. She has wheezes.       Coughing, wheezing throughout lung fields. Prolonged expirations, shallow breaths, increased respiratory effort.  Abdominal: Soft. Bowel sounds are normal.  Musculoskeletal: Normal range of motion.  Neurological: She is alert and oriented to person, place, and time. She has normal reflexes.  Skin: Skin is warm and dry.    ED Course  Procedures  MDM    Nicoletta Dress. Colon Branch, MD 11/17/10 0300

## 2010-11-17 NOTE — ED Notes (Signed)
Says she feels better,  sp02 at 91 on 2L.

## 2010-11-17 NOTE — ED Notes (Signed)
Pt to receive breathing tx then d/c.

## 2010-11-17 NOTE — ED Notes (Signed)
Alert, drinking flds.

## 2010-11-17 NOTE — Progress Notes (Signed)
0800 Patient has rested for last 2 hours without wheezing. Feels better. Will administer additional albuterol/atrovent treatment and begin PO antibiotics for COPD exacerbation.

## 2010-11-17 NOTE — ED Notes (Signed)
Sleeping, spo2 at 90 on 3 L [place]

## 2010-11-17 NOTE — ED Notes (Signed)
Cont to cough, treatment ordered.

## 2010-11-17 NOTE — ED Notes (Signed)
Received report from V Engineer, maintenance. Pt resting. Water given. Appears sob just resting. Advised Dr. Colon Branch will be is asap to update. NAD. Alert/oriented.

## 2010-12-16 ENCOUNTER — Other Ambulatory Visit: Payer: Self-pay | Admitting: Orthopedic Surgery

## 2010-12-16 DIAGNOSIS — M545 Low back pain, unspecified: Secondary | ICD-10-CM

## 2010-12-22 ENCOUNTER — Ambulatory Visit
Admission: RE | Admit: 2010-12-22 | Discharge: 2010-12-22 | Disposition: A | Discharge status: Home / Self Care | Payer: Medicare Other | Source: Ambulatory Visit | Attending: Orthopedic Surgery | Admitting: Orthopedic Surgery

## 2010-12-22 ENCOUNTER — Other Ambulatory Visit: Payer: Medicare Other

## 2010-12-22 DIAGNOSIS — M545 Low back pain, unspecified: Secondary | ICD-10-CM

## 2010-12-22 MED ORDER — GADOBENATE DIMEGLUMINE 529 MG/ML IV SOLN
10.0000 mL | Freq: Once | INTRAVENOUS | Status: AC | PRN
  Administered 2010-12-22: 10 mL via INTRAVENOUS

## 2011-01-12 ENCOUNTER — Other Ambulatory Visit: Payer: Self-pay

## 2011-01-12 ENCOUNTER — Emergency Department (HOSPITAL_COMMUNITY)
Admission: EM | Admit: 2011-01-12 | Discharge: 2011-01-12 | Disposition: A | Discharge status: Home / Self Care | Payer: PRIVATE HEALTH INSURANCE | Attending: Emergency Medicine | Admitting: Emergency Medicine

## 2011-01-12 ENCOUNTER — Emergency Department (HOSPITAL_COMMUNITY): Payer: PRIVATE HEALTH INSURANCE

## 2011-01-12 ENCOUNTER — Encounter (HOSPITAL_COMMUNITY): Payer: Self-pay | Admitting: *Deleted

## 2011-01-12 DIAGNOSIS — J45902 Unspecified asthma with status asthmaticus: Secondary | ICD-10-CM | POA: Insufficient documentation

## 2011-01-12 DIAGNOSIS — J449 Chronic obstructive pulmonary disease, unspecified: Secondary | ICD-10-CM

## 2011-01-12 DIAGNOSIS — Z87891 Personal history of nicotine dependence: Secondary | ICD-10-CM | POA: Insufficient documentation

## 2011-01-12 HISTORY — DX: Bronchitis, not specified as acute or chronic: J40

## 2011-01-12 LAB — DIFFERENTIAL
Basophils Absolute: 0 10*3/uL (ref 0.0–0.1)
Basophils Relative: 0 % (ref 0–1)
Eosinophils Absolute: 1.2 10*3/uL — ABNORMAL HIGH (ref 0.0–0.7)
Eosinophils Relative: 13 % — ABNORMAL HIGH (ref 0–5)
Lymphocytes Relative: 30 % (ref 12–46)
Lymphs Abs: 2.8 10*3/uL (ref 0.7–4.0)
Monocytes Absolute: 0.5 10*3/uL (ref 0.1–1.0)
Monocytes Relative: 5 % (ref 3–12)
Neutro Abs: 4.9 10*3/uL (ref 1.7–7.7)
Neutrophils Relative %: 52 % (ref 43–77)

## 2011-01-12 LAB — BASIC METABOLIC PANEL
BUN: 4 mg/dL — ABNORMAL LOW (ref 6–23)
CO2: 34 mEq/L — ABNORMAL HIGH (ref 19–32)
Calcium: 9.7 mg/dL (ref 8.4–10.5)
Chloride: 98 mEq/L (ref 96–112)
Creatinine, Ser: 0.68 mg/dL (ref 0.50–1.10)
GFR calc Af Amer: >60 mL/min (ref >60)
GFR calc non Af Amer: >60 mL/min (ref >60)
Glucose, Bld: 113 mg/dL — ABNORMAL HIGH (ref 70–99)
Potassium: 3.9 mEq/L (ref 3.5–5.1)
Sodium: 142 mEq/L (ref 135–145)

## 2011-01-12 LAB — CBC
HCT: 47.6 % — ABNORMAL HIGH (ref 36.0–46.0)
Hemoglobin: 15.7 g/dL — ABNORMAL HIGH (ref 12.0–15.0)
MCH: 29.6 pg (ref 26.0–34.0)
MCHC: 33 g/dL (ref 30.0–36.0)
MCV: 89.6 fL (ref 78.0–100.0)
Platelets: 247 10*3/uL (ref 150–400)
RBC: 5.31 MIL/uL — ABNORMAL HIGH (ref 3.87–5.11)
RDW: 14.5 % (ref 11.5–15.5)
WBC: 9.4 10*3/uL (ref 4.0–10.5)

## 2011-01-12 MED ORDER — METHYLPREDNISOLONE SODIUM SUCC 125 MG IJ SOLR
125.0000 mg | Freq: Once | INTRAMUSCULAR | Status: AC
  Administered 2011-01-12: 125 mg via INTRAVENOUS
  Filled 2011-01-12: qty 2

## 2011-01-12 MED ORDER — ALBUTEROL SULFATE (5 MG/ML) 0.5% IN NEBU
2.5000 mg | INHALATION_SOLUTION | Freq: Once | RESPIRATORY_TRACT | Status: AC
  Administered 2011-01-12: 2.5 mg via RESPIRATORY_TRACT
  Filled 2011-01-12: qty 0.5

## 2011-01-12 MED ORDER — ZOLPIDEM TARTRATE 5 MG PO TABS: 10.0000 mg | ORAL_TABLET | Freq: Every evening | ORAL | Status: DC | PRN

## 2011-01-12 MED ORDER — ALBUTEROL SULFATE (5 MG/ML) 0.5% IN NEBU
5.0000 mg | INHALATION_SOLUTION | Freq: Once | RESPIRATORY_TRACT | Status: AC
  Administered 2011-01-12: 5 mg via RESPIRATORY_TRACT
  Filled 2011-01-12: qty 1

## 2011-01-12 MED ORDER — PREDNISONE 50 MG PO TABS: 50.0000 mg | ORAL_TABLET | Freq: Every day | ORAL | Status: AC

## 2011-01-12 MED ORDER — IPRATROPIUM BROMIDE 0.02 % IN SOLN
0.5000 mg | Freq: Once | RESPIRATORY_TRACT | Status: AC
  Administered 2011-01-12: 0.5 mg via RESPIRATORY_TRACT
  Filled 2011-01-12: qty 2.5

## 2011-01-12 MED ORDER — DOXYCYCLINE HYCLATE 100 MG PO CAPS: 100.0000 mg | ORAL_CAPSULE | Freq: Two times a day (BID) | ORAL | Status: AC

## 2011-01-12 MED ORDER — ALBUTEROL SULFATE HFA 108 (90 BASE) MCG/ACT IN AERS: 1.0000 | INHALATION_SPRAY | Freq: Four times a day (QID) | RESPIRATORY_TRACT | Status: DC | PRN

## 2011-01-12 NOTE — ED Notes (Signed)
Family at bedside. Family does not need anything at this time. Patient is coughing was adjusted in the bed and head raised some more.

## 2011-01-12 NOTE — ED Provider Notes (Signed)
History   Chart scribed for [name], MD by Enos Fling; the patient was seen in room APA12/APA12; this patient's care was started at 1:17 PM.    CSN: [id number] Arrival date & time: 01/12/2011  1:08 PM  Chief Complaint  Patient presents with  . Shortness of Breath   HPI Kristin Wells is a 60 y.o. female who presents to the Emergency Department complaining of sob. Pt c/o sob gradually worsening x 2 weeks, and much worse today. No recent cough or cold sx. No chest pain, leg swelling, f/c, n/v, weakness, or ha. Pt states she is currently taking prednisone and also uses albuterol neb at home. +h/o COPD, asthma, and bronchitis. Quit smoking this week.    Past Medical History  Diagnosis Date  . COPD (chronic obstructive pulmonary disease)   . Asthma   . Bronchitis     History reviewed. No pertinent past surgical history.  History reviewed. No pertinent family history.  History  Substance Use Topics  . Smoking status: Former Smoker    Quit date: 11/14/2010  . Smokeless tobacco: Not on file  . Alcohol Use: No    OB History    Grav Para Term Preterm Abortions TAB SAB Ect Mult Living                 Previous Medications   ALBUTEROL (PROVENTIL HFA;VENTOLIN HFA) 108 (90 BASE) MCG/ACT INHALER    Inhale 2 puffs into the lungs every 6 (six) hours as needed. Wheezing/shortness of breath    ALBUTEROL (PROVENTIL) (2.5 MG/3ML) 0.083% NEBULIZER SOLUTION    Take 2.5 mg by nebulization every 6 (six) hours as needed.     ALPRAZOLAM (XANAX) 0.5 MG TABLET    Take 0.5 mg by mouth 4 (four) times daily as needed.     CYCLOBENZAPRINE (FLEXERIL) 10 MG TABLET    Take 5 mg by mouth 2 (two) times daily.     FLUTICASONE-SALMETEROL (ADVAIR) 250-50 MCG/DOSE AEPB    Inhale 1 puff into the lungs every 12 (twelve) hours.     GABAPENTIN (NEURONTIN) 300 MG CAPSULE    Take 300 mg by mouth 2 (two) times daily.     MULTIPLE VITAMINS-MINERALS (WOMENS BONE HEALTH) TABS    Take 1 tablet by mouth daily.     OXYBUTYNIN (DITROPAN) 5 MG TABLET    Take 5 mg by mouth 2 (two) times daily.     OXYCODONE-ACETAMINOPHEN (PERCOCET) 5-325 MG PER TABLET    Take 0.5 tablets by mouth every 4 (four) hours as needed. Back spurs    PAROXETINE (PAXIL) 30 MG TABLET    Take 30 mg by mouth every morning.     TIOTROPIUM (SPIRIVA) 18 MCG INHALATION CAPSULE    Place 18 mcg into inhaler and inhale daily.     TRAZODONE (DESYREL) 100 MG TABLET    Take 100 mg by mouth at bedtime.       Allergies as of 01/12/2011  . (No Known Allergies)     Review of Systems 10 Systems reviewed and are negative for acute change except as noted in the HPI.  Physical Exam  BP 131/91  Pulse 115  Temp(Src) 97.6 F (36.4 C) (Oral)  Resp 22  Ht 5\' 1"  (1.549 m)  Wt 106 lb (48.081 kg)  BMI 20.03 kg/m2  SpO2 96%  Physical Exam  Nursing note and vitals reviewed. Constitutional: She is oriented to person, place, and time. No distress.       Appearance consistent with age  of record  HENT:  Head: Normocephalic and atraumatic.  Right Ear: External ear normal.  Left Ear: External ear normal.  Nose: Nose normal.  Mouth/Throat: Oropharynx is clear and moist.  Eyes: Conjunctivae are normal.  Neck: Neck supple.  Cardiovascular: Regular rhythm.  Exam reveals no gallop and no friction rub.   No murmur heard.      tachycardic  Pulmonary/Chest: She has wheezes. She has no rhonchi. She has no rales. She exhibits no tenderness.       tachypneic  Abdominal: Soft. There is no tenderness.  Musculoskeletal: Normal range of motion. She exhibits no edema.       Normal appearance of extremities  Neurological: She is alert and oriented to person, place, and time. No sensory deficit.  Skin: No rash noted.       Color normal  Psychiatric: She has a normal mood and affect.    ED Course  Procedures  OTHER DATA REVIEWED: Nursing notes and vital signs reviewed. Prior records reviewed.   DIAGNOSTIC STUDIES: EKG:   Date: 01/12/2011  Rate: 110   Rhythm: sinus tachycardia  QRS Axis: normal  Intervals: normal  ST/T Wave abnormalities: normal  Conduction Disutrbances:none  Narrative Interpretation:   Old EKG Reviewed: none available    LABS / RADIOLOGY: Results for orders placed during the hospital encounter of 01/12/11  CBC      Component Value Range   WBC 9.4  4.0 - 10.5 (K/uL)   RBC 5.31 (*) 3.87 - 5.11 (MIL/uL)   Hemoglobin 15.7 (*) 12.0 - 15.0 (g/dL)   HCT 16.1 (*) 09.6 - 46.0 (%)   MCV 89.6  78.0 - 100.0 (fL)   MCH 29.6  26.0 - 34.0 (pg)   MCHC 33.0  30.0 - 36.0 (g/dL)   RDW 04.5  40.9 - 81.1 (%)   Platelets 247  150 - 400 (K/uL)  DIFFERENTIAL      Component Value Range   Neutrophils Relative 52  43 - 77 (%)   Neutro Abs 4.9  1.7 - 7.7 (K/uL)   Lymphocytes Relative 30  12 - 46 (%)   Lymphs Abs 2.8  0.7 - 4.0 (K/uL)   Monocytes Relative 5  3 - 12 (%)   Monocytes Absolute 0.5  0.1 - 1.0 (K/uL)   Eosinophils Relative 13 (*) 0 - 5 (%)   Eosinophils Absolute 1.2 (*) 0.0 - 0.7 (K/uL)   Basophils Relative 0  0 - 1 (%)   Basophils Absolute 0.0  0.0 - 0.1 (K/uL)  BASIC METABOLIC PANEL      Component Value Range   Sodium 142  135 - 145 (mEq/L)   Potassium 3.9  3.5 - 5.1 (mEq/L)   Chloride 98  96 - 112 (mEq/L)   CO2 34 (*) 19 - 32 (mEq/L)   Glucose, Bld 113 (*) 70 - 99 (mg/dL)   BUN 4 (*) 6 - 23 (mg/dL)   Creatinine, Ser 9.14  0.50 - 1.10 (mg/dL)   Calcium 9.7  8.4 - 78.2 (mg/dL)   GFR calc non Af Amer >60  >60 (mL/min)   GFR calc Af Amer >60  >60 (mL/min)   Dg Chest 2 View  01/12/2011  *RADIOLOGY REPORT*  Clinical Data: Shortness of breath.  CHEST - 2 VIEW  Comparison: Plain films of the chest 11/16/2010 and 10/27/2009.  CT chest 01/08/2010.  Findings: The lungs are clear.  Heart size is normal.  No pneumothorax or pleural effusion.  IMPRESSION: No acute disease.  Original Report  Authenticated By: Bernadene Bell. D'ALESSIO, M.D.    ZOX:WRUEAV copd/asthma;  Can d/c p HHN, cxr, steroids  IMPRESSION: No diagnosis  found.   PLAN: All results reviewed and discussed with pt, questions answered, pt agreeable with plan.    MEDS GIVEN IN ED:  Medications  Fluticasone-Salmeterol (ADVAIR) 250-50 MCG/DOSE AEPB (not administered)  traZODone (DESYREL) 100 MG tablet (not administered)  PARoxetine (PAXIL) 30 MG tablet (not administered)  gabapentin (NEURONTIN) 300 MG capsule (not administered)  albuterol (PROVENTIL HFA;VENTOLIN HFA) 108 (90 BASE) MCG/ACT inhaler (not administered)  oxybutynin (DITROPAN) 5 MG tablet (not administered)  oxyCODONE-acetaminophen (PERCOCET) 5-325 MG per tablet (not administered)  Multiple Vitamins-Minerals (WOMENS BONE HEALTH) TABS (not administered)  albuterol (PROVENTIL) (5 MG/ML) 0.5% nebulizer solution 2.5 mg (2.5 mg Nebulization Given 01/12/11 1315)  methylPREDNISolone sodium succinate (SOLU-MEDROL) 125 MG injection 125 mg (125 mg Intravenous Given 01/12/11 1400)  albuterol (PROVENTIL) (5 MG/ML) 0.5% nebulizer solution 5 mg (5 mg Nebulization Given 01/12/11 1400)  ipratropium (ATROVENT) 0.02 % nebulizer solution 0.5 mg (0.5 mg Nebulization Given 01/12/11 1400)    SCRIBE ATTESTATION   I personally performed the services described in this documentation, which was scribed in my presence. The recorded information has been reviewed and considered. No att. providers found         [name], MD 01/15/11 1356

## 2011-01-12 NOTE — ED Notes (Signed)
Pt c/o sob. Pt states she has been sob for 2 weeks.

## 2011-02-08 ENCOUNTER — Encounter: Payer: Self-pay | Admitting: Gastroenterology

## 2011-02-09 ENCOUNTER — Other Ambulatory Visit (HOSPITAL_COMMUNITY): Payer: Self-pay | Admitting: Internal Medicine

## 2011-02-09 DIAGNOSIS — Z139 Encounter for screening, unspecified: Secondary | ICD-10-CM

## 2011-02-14 ENCOUNTER — Ambulatory Visit (HOSPITAL_COMMUNITY)
Admission: RE | Admit: 2011-02-14 | Discharge: 2011-02-14 | Disposition: A | Discharge status: Home / Self Care | Payer: PRIVATE HEALTH INSURANCE | Source: Ambulatory Visit | Attending: Internal Medicine | Admitting: Internal Medicine

## 2011-02-14 DIAGNOSIS — Z1231 Encounter for screening mammogram for malignant neoplasm of breast: Secondary | ICD-10-CM | POA: Insufficient documentation

## 2011-02-14 DIAGNOSIS — Z139 Encounter for screening, unspecified: Secondary | ICD-10-CM

## 2011-02-21 LAB — DIFFERENTIAL
Basophils Absolute: 0
Basophils Relative: 0
Eosinophils Absolute: 0.1
Eosinophils Relative: 2
Lymphocytes Relative: 32
Lymphs Abs: 2.8
Monocytes Absolute: 0.4
Monocytes Relative: 4
Neutro Abs: 5.4
Neutrophils Relative %: 62

## 2011-02-21 LAB — CBC
HCT: 45.3
Hemoglobin: 15.3 — ABNORMAL HIGH
MCHC: 33.7
MCV: 84.6
Platelets: 276
RBC: 5.36 — ABNORMAL HIGH
RDW: 14.8 — ABNORMAL HIGH
WBC: 8.7

## 2011-02-21 LAB — BASIC METABOLIC PANEL
BUN: 4 — ABNORMAL LOW
CO2: 27
Calcium: 8.8
Chloride: 100
Creatinine, Ser: 0.77
GFR calc Af Amer: >60
GFR calc non Af Amer: >60
Glucose, Bld: 113 — ABNORMAL HIGH
Potassium: 3.5
Sodium: 134 — ABNORMAL LOW

## 2011-02-21 LAB — ETHANOL: Alcohol, Ethyl (B): <5

## 2011-02-21 LAB — RAPID URINE DRUG SCREEN, HOSP PERFORMED
Amphetamines: NOT DETECTED
Barbiturates: NOT DETECTED
Benzodiazepines: NOT DETECTED
Cocaine: POSITIVE — AB
Opiates: NOT DETECTED
Tetrahydrocannabinol: NOT DETECTED

## 2011-03-14 ENCOUNTER — Emergency Department (HOSPITAL_COMMUNITY)
Admission: EM | Admit: 2011-03-14 | Discharge: 2011-03-14 | Disposition: A | Discharge status: Home / Self Care | Payer: PRIVATE HEALTH INSURANCE | Attending: Emergency Medicine | Admitting: Emergency Medicine

## 2011-03-14 ENCOUNTER — Encounter (HOSPITAL_COMMUNITY): Payer: Self-pay

## 2011-03-14 DIAGNOSIS — Z87891 Personal history of nicotine dependence: Secondary | ICD-10-CM | POA: Insufficient documentation

## 2011-03-14 DIAGNOSIS — Z760 Encounter for issue of repeat prescription: Secondary | ICD-10-CM

## 2011-03-14 DIAGNOSIS — J45909 Unspecified asthma, uncomplicated: Secondary | ICD-10-CM | POA: Insufficient documentation

## 2011-03-14 DIAGNOSIS — F411 Generalized anxiety disorder: Secondary | ICD-10-CM | POA: Insufficient documentation

## 2011-03-14 HISTORY — DX: Anxiety disorder, unspecified: F41.9

## 2011-03-14 HISTORY — DX: Dorsalgia, unspecified: M54.9

## 2011-03-14 MED ORDER — ALPRAZOLAM 0.5 MG PO TABS
0.5000 mg | ORAL_TABLET | Freq: Once | ORAL | Status: AC
  Administered 2011-03-14: 0.5 mg via ORAL
  Filled 2011-03-14: qty 1

## 2011-03-14 NOTE — ED Notes (Signed)
Pt a/ox4. resp even and unlabored. NAD at this time. D/C instructions reviewed with pt. Pt verbalized understanding. Pt ambulated to lobby with steady gate to  wait for ride.

## 2011-03-14 NOTE — Discharge Instructions (Signed)
Medication Refill, Emergency Department We cannot refill your xanax for you today,  Because you have a refill waiting for you at [facility].  You were however,  Given a dose to help your symptoms until you can get to [facility] to pick up your medicine.   It is best for your medical care, however, to take care of getting refills done through your primary caregiver's office. They have your records and can do a better job of follow-up than we can in the emergency department. On maintenance medications, we often only prescribe enough medications to get you by until you are able to see your regular caregiver. This is a more expensive way to refill medications. In the future, please plan for refills so that you will not have to use the emergency department for this. Thank you for your help. Your help allows [place] to better take care of the daily emergencies that enter our department. Document Released: 08/12/2003 Document Revised: 01/05/2011 Document Reviewed: 04/25/2005 [facility] Patient Information 2012 [place], [place].

## 2011-03-14 NOTE — ED Notes (Signed)
Pt here for medication refill of "nerve pill".

## 2011-03-15 NOTE — ED Provider Notes (Signed)
History     CSN: [id number] Arrival date & time: 03/14/2011  2:54 PM   First MD Initiated Contact with Patient 03/14/11 1522      Chief Complaint  Patient presents with  . Medication Refill    (Consider location/radiation/quality/duration/timing/severity/associated sxs/prior treatment) HPI Comments: Patient presents for assistance with xanax refill.  She states she was visited by her home health nurse today who was concerned that she had run out 4 days ago and it was dangerous to stop taking it abruptly.  She states she does have a refill waiting for her at [facility],  But has been unable to get there to pick it up (she walked here).  She denies having nausea,  Vomiting,  Tremor or other physical symptoms.  The history is provided by the patient.    Past Medical History  Diagnosis Date  . COPD (chronic obstructive pulmonary disease)   . Asthma   . Bronchitis   . Anxiety   . Back pain     Past Surgical History  Procedure Date  . Abdominal hysterectomy     No family history on file.  History  Substance Use Topics  . Smoking status: Former Smoker    Quit date: 11/14/2010  . Smokeless tobacco: Not on file  . Alcohol Use: No    OB History    Grav Para Term Preterm Abortions TAB SAB Ect Mult Living                  Review of Systems  Constitutional: Negative for fever.  HENT: Negative for congestion, sore throat and neck pain.   Eyes: Negative.   Respiratory: Negative for chest tightness and shortness of breath.   Cardiovascular: Negative for chest pain.  Gastrointestinal: Negative for nausea and abdominal pain.  Genitourinary: Negative.   Musculoskeletal: Negative for joint swelling and arthralgias.  Skin: Negative.  Negative for rash and wound.  Neurological: Negative for dizziness, tremors, seizures, speech difficulty, weakness, light-headedness, numbness and headaches.  Hematological: Negative.   Psychiatric/Behavioral: Negative.     Allergies  Review of  patient's allergies indicates no known allergies.  Home Medications   Current Outpatient Rx  Name Route Sig Dispense Refill  . ALBUTEROL SULFATE HFA 108 (90 BASE) MCG/ACT IN AERS Inhalation Inhale 2 puffs into the lungs every 6 (six) hours as needed. Wheezing/shortness of breath     . ALBUTEROL SULFATE HFA 108 (90 BASE) MCG/ACT IN AERS Inhalation Inhale 1-2 puffs into the lungs every 6 (six) hours as needed for wheezing. 1 Inhaler 0  . ALBUTEROL SULFATE (2.5 MG/3ML) 0.083% IN NEBU Nebulization Take 2.5 mg by nebulization every 6 (six) hours as needed.      . ALPRAZOLAM 0.5 MG PO TABS Oral Take 0.5 mg by mouth 4 (four) times daily as needed.      . CYCLOBENZAPRINE HCL 10 MG PO TABS Oral Take 5 mg by mouth 2 (two) times daily.      Marland Kitchen FLUTICASONE-SALMETEROL 250-50 MCG/DOSE IN AEPB Inhalation Inhale 1 puff into the lungs every 12 (twelve) hours.      Marland Kitchen GABAPENTIN 300 MG PO CAPS Oral Take 300 mg by mouth 2 (two) times daily.      . WOMENS BONE HEALTH PO TABS Oral Take 1 tablet by mouth daily.      . OXYBUTYNIN CHLORIDE 5 MG PO TABS Oral Take 5 mg by mouth 2 (two) times daily.      . OXYCODONE-ACETAMINOPHEN 5-325 MG PO TABS Oral Take 0.5 tablets  by mouth every 4 (four) hours as needed. Back spurs     . PAROXETINE HCL 30 MG PO TABS Oral Take 30 mg by mouth every morning.      Marland Kitchen TIOTROPIUM BROMIDE MONOHYDRATE 18 MCG IN CAPS Inhalation Place 18 mcg into inhaler and inhale daily.      . TRAZODONE HCL 100 MG PO TABS Oral Take 100 mg by mouth at bedtime.      Marland Kitchen ZOLPIDEM TARTRATE 5 MG PO TABS Oral Take 2 tablets (10 mg total) by mouth at bedtime as needed for sleep. 15 tablet 0    BP 111/68  Pulse 84  Temp(Src) 98.7 F (37.1 C) (Oral)  Resp 16  Ht 5\' 3"  (1.6 m)  Wt 138 lb (62.596 kg)  BMI 24.45 kg/m2  SpO2 99%  Physical Exam  Nursing note and vitals reviewed. Constitutional: She is oriented to person, place, and time. She appears well-developed and well-nourished.  HENT:  Head: Normocephalic  and atraumatic.  Eyes: Conjunctivae are normal.  Neck: Normal range of motion.  Cardiovascular: Normal rate, regular rhythm, normal heart sounds and intact distal pulses.   Pulmonary/Chest: Effort normal and breath sounds normal. She has no wheezes.  Abdominal: Soft. Bowel sounds are normal. There is no tenderness.  Musculoskeletal: Normal range of motion.  Neurological: She is alert and oriented to person, place, and time. She has normal strength. She displays normal reflexes. No cranial nerve deficit or sensory deficit. She exhibits normal muscle tone. Coordination and gait normal.  Skin: Skin is warm and dry.  Psychiatric: She has a normal mood and affect.    ED Course  Procedures (including critical care time)  Labs Reviewed - No data to display No results found.   1. Medication refill       MDM  Patient lives in community that does offer free transportation to appointments and shopping per RN.  Patient reminded of this.  She will get her meds filled tomorrow.  Given xanax 0.5 mg today.        Candis Musa, PA 03/15/11 1757

## 2011-03-15 NOTE — ED Provider Notes (Signed)
Medical screening examination/treatment/procedure(s) were performed by non-physician practitioner and as supervising physician I was immediately available for consultation/collaboration.   Amarrah Meinhart W Treysen Sudbeck, MD 03/15/11 2107 

## 2011-05-26 ENCOUNTER — Other Ambulatory Visit: Payer: Self-pay | Admitting: Gastroenterology

## 2011-05-26 ENCOUNTER — Ambulatory Visit
Admission: RE | Admit: 2011-05-26 | Discharge: 2011-05-26 | Disposition: A | Discharge status: Home / Self Care | Payer: PRIVATE HEALTH INSURANCE | Source: Ambulatory Visit | Attending: Gastroenterology | Admitting: Gastroenterology

## 2011-05-26 DIAGNOSIS — R140 Abdominal distension (gaseous): Secondary | ICD-10-CM

## 2011-06-02 ENCOUNTER — Ambulatory Visit
Admission: RE | Admit: 2011-06-02 | Discharge: 2011-06-02 | Disposition: A | Discharge status: Home / Self Care | Payer: PRIVATE HEALTH INSURANCE | Source: Ambulatory Visit | Attending: Gastroenterology | Admitting: Gastroenterology

## 2011-06-10 ENCOUNTER — Other Ambulatory Visit: Payer: Self-pay | Admitting: Gastroenterology

## 2011-06-10 ENCOUNTER — Other Ambulatory Visit (HOSPITAL_COMMUNITY)
Admission: RE | Admit: 2011-06-10 | Discharge: 2011-06-10 | Disposition: A | Discharge status: Home / Self Care | Payer: PRIVATE HEALTH INSURANCE | Source: Ambulatory Visit | Attending: Gastroenterology | Admitting: Gastroenterology

## 2011-06-10 DIAGNOSIS — J9600 Acute respiratory failure, unspecified whether with hypoxia or hypercapnia: Secondary | ICD-10-CM

## 2011-06-10 DIAGNOSIS — B379 Candidiasis, unspecified: Secondary | ICD-10-CM | POA: Insufficient documentation

## 2011-06-10 HISTORY — PX: COLONOSCOPY: SHX174

## 2011-06-10 HISTORY — PX: ESOPHAGOGASTRODUODENOSCOPY: SHX1529

## 2011-06-10 HISTORY — DX: Acute respiratory failure, unspecified whether with hypoxia or hypercapnia: J96.00

## 2011-06-10 LAB — HM COLONOSCOPY

## 2011-06-27 ENCOUNTER — Inpatient Hospital Stay (HOSPITAL_COMMUNITY)
Admission: EM | Admit: 2011-06-27 | Discharge: 2011-07-05 | DRG: 871 | Disposition: A | Discharge status: Home Health | Payer: PRIVATE HEALTH INSURANCE | Attending: Internal Medicine | Admitting: Internal Medicine

## 2011-06-27 ENCOUNTER — Inpatient Hospital Stay (HOSPITAL_COMMUNITY): Payer: PRIVATE HEALTH INSURANCE

## 2011-06-27 ENCOUNTER — Encounter (HOSPITAL_COMMUNITY): Payer: Self-pay | Admitting: Emergency Medicine

## 2011-06-27 ENCOUNTER — Emergency Department (HOSPITAL_COMMUNITY): Payer: PRIVATE HEALTH INSURANCE

## 2011-06-27 ENCOUNTER — Other Ambulatory Visit: Payer: Self-pay

## 2011-06-27 DIAGNOSIS — Z23 Encounter for immunization: Secondary | ICD-10-CM

## 2011-06-27 DIAGNOSIS — Z87891 Personal history of nicotine dependence: Secondary | ICD-10-CM

## 2011-06-27 DIAGNOSIS — I214 Non-ST elevation (NSTEMI) myocardial infarction: Secondary | ICD-10-CM | POA: Diagnosis present

## 2011-06-27 DIAGNOSIS — F32A Depression, unspecified: Secondary | ICD-10-CM | POA: Diagnosis present

## 2011-06-27 DIAGNOSIS — E049 Nontoxic goiter, unspecified: Secondary | ICD-10-CM | POA: Diagnosis present

## 2011-06-27 DIAGNOSIS — J9600 Acute respiratory failure, unspecified whether with hypoxia or hypercapnia: Secondary | ICD-10-CM | POA: Diagnosis present

## 2011-06-27 DIAGNOSIS — J449 Chronic obstructive pulmonary disease, unspecified: Secondary | ICD-10-CM

## 2011-06-27 DIAGNOSIS — J441 Chronic obstructive pulmonary disease with (acute) exacerbation: Secondary | ICD-10-CM | POA: Diagnosis present

## 2011-06-27 DIAGNOSIS — I498 Other specified cardiac arrhythmias: Secondary | ICD-10-CM | POA: Diagnosis present

## 2011-06-27 DIAGNOSIS — I2589 Other forms of chronic ischemic heart disease: Secondary | ICD-10-CM | POA: Diagnosis present

## 2011-06-27 DIAGNOSIS — F3289 Other specified depressive episodes: Secondary | ICD-10-CM | POA: Diagnosis present

## 2011-06-27 DIAGNOSIS — R739 Hyperglycemia, unspecified: Secondary | ICD-10-CM | POA: Diagnosis present

## 2011-06-27 DIAGNOSIS — M549 Dorsalgia, unspecified: Secondary | ICD-10-CM | POA: Diagnosis present

## 2011-06-27 DIAGNOSIS — E785 Hyperlipidemia, unspecified: Secondary | ICD-10-CM | POA: Diagnosis present

## 2011-06-27 DIAGNOSIS — A419 Sepsis, unspecified organism: Principal | ICD-10-CM | POA: Diagnosis present

## 2011-06-27 DIAGNOSIS — T380X5A Adverse effect of glucocorticoids and synthetic analogues, initial encounter: Secondary | ICD-10-CM | POA: Diagnosis present

## 2011-06-27 DIAGNOSIS — F329 Major depressive disorder, single episode, unspecified: Secondary | ICD-10-CM | POA: Diagnosis present

## 2011-06-27 DIAGNOSIS — K219 Gastro-esophageal reflux disease without esophagitis: Secondary | ICD-10-CM | POA: Diagnosis not present

## 2011-06-27 DIAGNOSIS — G894 Chronic pain syndrome: Secondary | ICD-10-CM | POA: Diagnosis present

## 2011-06-27 DIAGNOSIS — F341 Dysthymic disorder: Secondary | ICD-10-CM | POA: Diagnosis present

## 2011-06-27 DIAGNOSIS — J189 Pneumonia, unspecified organism: Secondary | ICD-10-CM | POA: Diagnosis present

## 2011-06-27 DIAGNOSIS — E872 Acidosis, unspecified: Secondary | ICD-10-CM | POA: Diagnosis present

## 2011-06-27 DIAGNOSIS — I2510 Atherosclerotic heart disease of native coronary artery without angina pectoris: Secondary | ICD-10-CM | POA: Diagnosis present

## 2011-06-27 DIAGNOSIS — F411 Generalized anxiety disorder: Secondary | ICD-10-CM | POA: Diagnosis present

## 2011-06-27 DIAGNOSIS — R5381 Other malaise: Secondary | ICD-10-CM | POA: Diagnosis not present

## 2011-06-27 DIAGNOSIS — G8929 Other chronic pain: Secondary | ICD-10-CM | POA: Diagnosis present

## 2011-06-27 DIAGNOSIS — I255 Ischemic cardiomyopathy: Secondary | ICD-10-CM | POA: Diagnosis present

## 2011-06-27 DIAGNOSIS — Z79899 Other long term (current) drug therapy: Secondary | ICD-10-CM

## 2011-06-27 DIAGNOSIS — J45901 Unspecified asthma with (acute) exacerbation: Secondary | ICD-10-CM | POA: Diagnosis present

## 2011-06-27 DIAGNOSIS — R7309 Other abnormal glucose: Secondary | ICD-10-CM | POA: Diagnosis present

## 2011-06-27 DIAGNOSIS — F17200 Nicotine dependence, unspecified, uncomplicated: Secondary | ICD-10-CM

## 2011-06-27 DIAGNOSIS — I9589 Other hypotension: Secondary | ICD-10-CM | POA: Diagnosis not present

## 2011-06-27 DIAGNOSIS — I959 Hypotension, unspecified: Secondary | ICD-10-CM | POA: Diagnosis not present

## 2011-06-27 HISTORY — DX: Acute respiratory failure, unspecified whether with hypoxia or hypercapnia: J96.00

## 2011-06-27 HISTORY — DX: Pneumonia, unspecified organism: J18.9

## 2011-06-27 HISTORY — DX: Nontoxic goiter, unspecified: E04.9

## 2011-06-27 HISTORY — DX: Ischemic cardiomyopathy: I25.5

## 2011-06-27 HISTORY — DX: Non-ST elevation (NSTEMI) myocardial infarction: I21.4

## 2011-06-27 LAB — GLUCOSE, CAPILLARY: Glucose-Capillary: 348 mg/dL — ABNORMAL HIGH (ref 70–99)

## 2011-06-27 LAB — BASIC METABOLIC PANEL
BUN: 8 mg/dL (ref 6–23)
CO2: 27 mEq/L (ref 19–32)
Calcium: 9.7 mg/dL (ref 8.4–10.5)
Chloride: 101 mEq/L (ref 96–112)
Creatinine, Ser: 0.96 mg/dL (ref 0.50–1.10)
GFR calc Af Amer: 74 mL/min — ABNORMAL LOW (ref >90)
GFR calc non Af Amer: 63 mL/min — ABNORMAL LOW (ref >90)
Glucose, Bld: 349 mg/dL — ABNORMAL HIGH (ref 70–99)
Potassium: 5.1 mEq/L (ref 3.5–5.1)
Sodium: 142 mEq/L (ref 135–145)

## 2011-06-27 LAB — BLOOD GAS, ARTERIAL
Acid-base deficit: 5.6 mmol/L — ABNORMAL HIGH (ref 0.0–2.0)
Bicarbonate: 26.9 mEq/L — ABNORMAL HIGH (ref 20.0–24.0)
Drawn by: 23534
O2 Content: 10 L/min
O2 Saturation: 94.7 %
Patient temperature: 37
TCO2: 27.7 mmol/L (ref 0–100)
pCO2 arterial: 147 mmHg (ref 35.0–45.0)
pH, Arterial: 6.893 — CL (ref 7.350–7.400)
pO2, Arterial: 116 mmHg — ABNORMAL HIGH (ref 80.0–100.0)

## 2011-06-27 LAB — CBC
HCT: 43.6 % (ref 36.0–46.0)
Hemoglobin: 13.8 g/dL (ref 12.0–15.0)
MCH: 28.8 pg (ref 26.0–34.0)
MCHC: 31.7 g/dL (ref 30.0–36.0)
MCV: 91 fL (ref 78.0–100.0)
Platelets: 385 10*3/uL (ref 150–400)
RBC: 4.79 MIL/uL (ref 3.87–5.11)
RDW: 14.4 % (ref 11.5–15.5)
WBC: 13.9 10*3/uL — ABNORMAL HIGH (ref 4.0–10.5)

## 2011-06-27 LAB — DIFFERENTIAL
Basophils Absolute: 0.1 10*3/uL (ref 0.0–0.1)
Basophils Relative: 0 % (ref 0–1)
Eosinophils Absolute: 0.7 10*3/uL (ref 0.0–0.7)
Eosinophils Relative: 5 % (ref 0–5)
Lymphocytes Relative: 35 % (ref 12–46)
Lymphs Abs: 4.8 10*3/uL — ABNORMAL HIGH (ref 0.7–4.0)
Monocytes Absolute: 0.5 10*3/uL (ref 0.1–1.0)
Monocytes Relative: 4 % (ref 3–12)
Neutro Abs: 7.9 10*3/uL — ABNORMAL HIGH (ref 1.7–7.7)
Neutrophils Relative %: 56 % (ref 43–77)

## 2011-06-27 LAB — URINE MICROSCOPIC-ADD ON

## 2011-06-27 LAB — URINALYSIS, ROUTINE W REFLEX MICROSCOPIC
Bilirubin Urine: NEGATIVE
Glucose, UA: NEGATIVE mg/dL
Ketones, ur: NEGATIVE mg/dL
Leukocytes, UA: NEGATIVE
Nitrite: NEGATIVE
Protein, ur: 100 mg/dL — AB
Specific Gravity, Urine: 1.015 (ref 1.005–1.030)
Urobilinogen, UA: 0.2 mg/dL (ref 0.0–1.0)
pH: 6 (ref 5.0–8.0)

## 2011-06-27 LAB — LACTIC ACID, PLASMA: Lactic Acid, Venous: 4.1 mmol/L — ABNORMAL HIGH (ref 0.5–2.2)

## 2011-06-27 LAB — PRO B NATRIURETIC PEPTIDE: Pro B Natriuretic peptide (BNP): 373.8 pg/mL — ABNORMAL HIGH (ref 0–125)

## 2011-06-27 LAB — PROCALCITONIN: Procalcitonin: 13.17 ng/mL

## 2011-06-27 LAB — D-DIMER, QUANTITATIVE: D-Dimer, Quant: 2.45 ug/mL-FEU — ABNORMAL HIGH (ref 0.00–0.48)

## 2011-06-27 LAB — POCT I-STAT TROPONIN I: Troponin i, poc: 0.03 ng/mL (ref 0.00–0.08)

## 2011-06-27 MED ORDER — ALBUTEROL SULFATE (5 MG/ML) 0.5% IN NEBU
2.5000 mg | INHALATION_SOLUTION | RESPIRATORY_TRACT | Status: DC
  Administered 2011-06-28 – 2011-06-29 (×9): 2.5 mg via RESPIRATORY_TRACT
  Filled 2011-06-27 (×9): qty 0.5

## 2011-06-27 MED ORDER — FLUTICASONE-SALMETEROL 250-50 MCG/DOSE IN AEPB
INHALATION_SPRAY | RESPIRATORY_TRACT | Status: AC
  Filled 2011-06-27: qty 14

## 2011-06-27 MED ORDER — FAMOTIDINE IN NACL 20-0.9 MG/50ML-% IV SOLN
20.0000 mg | Freq: Two times a day (BID) | INTRAVENOUS | Status: DC
  Administered 2011-06-28 – 2011-06-29 (×5): 20 mg via INTRAVENOUS
  Filled 2011-06-27 (×6): qty 50

## 2011-06-27 MED ORDER — LIDOCAINE HCL (CARDIAC) 20 MG/ML IV SOLN
INTRAVENOUS | Status: AC
  Filled 2011-06-27: qty 5

## 2011-06-27 MED ORDER — INSULIN ASPART 100 UNIT/ML ~~LOC~~ SOLN
0.0000 [IU] | SUBCUTANEOUS | Status: DC
  Administered 2011-06-28 (×2): 4 [IU] via SUBCUTANEOUS
  Administered 2011-06-28: 3 [IU] via SUBCUTANEOUS
  Administered 2011-06-28: 4 [IU] via SUBCUTANEOUS
  Administered 2011-06-28 (×2): 7 [IU] via SUBCUTANEOUS
  Administered 2011-06-29: 4 [IU] via SUBCUTANEOUS
  Administered 2011-06-29 (×4): 3 [IU] via SUBCUTANEOUS
  Filled 2011-06-27 (×2): qty 3

## 2011-06-27 MED ORDER — IPRATROPIUM BROMIDE 0.02 % IN SOLN
0.5000 mg | RESPIRATORY_TRACT | Status: DC | PRN
  Administered 2011-06-29: 0.5 mg via RESPIRATORY_TRACT
  Filled 2011-06-27: qty 2.5

## 2011-06-27 MED ORDER — ETOMIDATE 2 MG/ML IV SOLN
INTRAVENOUS | Status: AC
  Administered 2011-06-27: 20 mg via INTRAVENOUS
  Filled 2011-06-27: qty 20

## 2011-06-27 MED ORDER — DEXTROSE-NACL 5-0.45 % IV SOLN
INTRAVENOUS | Status: DC
  Administered 2011-06-27 – 2011-06-29 (×4): via INTRAVENOUS
  Administered 2011-06-29: 70 mL via INTRAVENOUS

## 2011-06-27 MED ORDER — FENTANYL CITRATE 0.05 MG/ML IJ SOLN
50.0000 ug/h | INTRAMUSCULAR | Status: DC
  Filled 2011-06-27: qty 50

## 2011-06-27 MED ORDER — PAROXETINE HCL 20 MG PO TABS
30.0000 mg | ORAL_TABLET | Freq: Every day | ORAL | Status: DC
  Administered 2011-06-28 – 2011-07-05 (×8): 30 mg via ORAL
  Filled 2011-06-27 (×8): qty 2

## 2011-06-27 MED ORDER — FLUTICASONE-SALMETEROL 250-50 MCG/DOSE IN AEPB
1.0000 | INHALATION_SPRAY | Freq: Two times a day (BID) | RESPIRATORY_TRACT | Status: DC
  Administered 2011-06-29 – 2011-07-05 (×10): 1 via RESPIRATORY_TRACT
  Filled 2011-06-27: qty 14

## 2011-06-27 MED ORDER — MIDAZOLAM HCL 5 MG/5ML IJ SOLN
4.0000 mg | Freq: Once | INTRAMUSCULAR | Status: AC
  Administered 2011-06-27: 4 mg via INTRAVENOUS

## 2011-06-27 MED ORDER — PROPOFOL 10 MG/ML IV EMUL
INTRAVENOUS | Status: AC
  Filled 2011-06-27: qty 100

## 2011-06-27 MED ORDER — ROCURONIUM BROMIDE 50 MG/5ML IV SOLN
INTRAVENOUS | Status: AC
  Filled 2011-06-27: qty 2

## 2011-06-27 MED ORDER — METHYLPREDNISOLONE SODIUM SUCC 125 MG IJ SOLR
80.0000 mg | Freq: Four times a day (QID) | INTRAMUSCULAR | Status: DC
  Administered 2011-06-27 – 2011-06-29 (×6): 80 mg via INTRAVENOUS
  Filled 2011-06-27 (×6): qty 2

## 2011-06-27 MED ORDER — SUCCINYLCHOLINE CHLORIDE 20 MG/ML IJ SOLN
INTRAMUSCULAR | Status: AC
  Administered 2011-06-27: 100 mg via INTRAVENOUS
  Filled 2011-06-27: qty 1

## 2011-06-27 MED ORDER — MIDAZOLAM HCL 2 MG/2ML IJ SOLN
4.0000 mg | Freq: Once | INTRAMUSCULAR | Status: AC
  Administered 2011-06-27: 4 mg via INTRAVENOUS
  Filled 2011-06-27: qty 4

## 2011-06-27 MED ORDER — METHYLPREDNISOLONE SODIUM SUCC 125 MG IJ SOLR
125.0000 mg | Freq: Once | INTRAMUSCULAR | Status: AC
  Administered 2011-06-27: 125 mg via INTRAVENOUS

## 2011-06-27 MED ORDER — METHYLPREDNISOLONE SODIUM SUCC 125 MG IJ SOLR
INTRAMUSCULAR | Status: AC
  Administered 2011-06-27: 125 mg via INTRAVENOUS
  Filled 2011-06-27: qty 2

## 2011-06-27 MED ORDER — LIDOCAINE HCL (CARDIAC) 20 MG/ML IV SOLN
INTRAVENOUS | Status: AC
  Administered 2011-06-27: 100 mg via INTRAVENOUS
  Filled 2011-06-27: qty 5

## 2011-06-27 MED ORDER — INSULIN GLARGINE 100 UNIT/ML ~~LOC~~ SOLN
10.0000 [IU] | Freq: Every day | SUBCUTANEOUS | Status: DC
  Administered 2011-06-28: 10 [IU] via SUBCUTANEOUS
  Filled 2011-06-27: qty 3

## 2011-06-27 MED ORDER — PROPOFOL 10 MG/ML IV EMUL
5.0000 ug/kg/min | INTRAVENOUS | Status: DC
  Administered 2011-06-27: 12 ug/kg/min via INTRAVENOUS
  Administered 2011-06-28: 30 ug/kg/min via INTRAVENOUS
  Administered 2011-06-29: 20 ug/kg/min via INTRAVENOUS
  Filled 2011-06-27 (×4): qty 100

## 2011-06-27 MED ORDER — ETOMIDATE 2 MG/ML IV SOLN
INTRAVENOUS | Status: AC
  Filled 2011-06-27: qty 20

## 2011-06-27 MED ORDER — FAMOTIDINE IN NACL 20-0.9 MG/50ML-% IV SOLN
INTRAVENOUS | Status: AC
  Filled 2011-06-27: qty 50

## 2011-06-27 MED ORDER — MIDAZOLAM HCL 2 MG/2ML IJ SOLN
2.0000 mg | Freq: Once | INTRAMUSCULAR | Status: AC
  Administered 2011-06-27: 2 mg via INTRAVENOUS
  Filled 2011-06-27: qty 2

## 2011-06-27 MED ORDER — ALBUTEROL SULFATE (5 MG/ML) 0.5% IN NEBU
2.5000 mg | INHALATION_SOLUTION | RESPIRATORY_TRACT | Status: DC | PRN
  Administered 2011-06-29: 2.5 mg via RESPIRATORY_TRACT
  Filled 2011-06-27: qty 0.5

## 2011-06-27 MED ORDER — MOXIFLOXACIN HCL IN NACL 400 MG/250ML IV SOLN
400.0000 mg | INTRAVENOUS | Status: DC
  Administered 2011-06-28 – 2011-06-29 (×3): 400 mg via INTRAVENOUS
  Filled 2011-06-27 (×3): qty 250

## 2011-06-27 MED ORDER — MIDAZOLAM HCL 2 MG/2ML IJ SOLN
INTRAMUSCULAR | Status: AC
  Filled 2011-06-27: qty 4

## 2011-06-27 MED ORDER — FENTANYL BOLUS VIA INFUSION
50.0000 ug | Freq: Four times a day (QID) | INTRAVENOUS | Status: DC | PRN
  Filled 2011-06-27: qty 100

## 2011-06-27 MED ORDER — LIDOCAINE HCL (CARDIAC) 10 MG/ML IV SOLN: 100.0000 mg | Freq: Once | INTRAVENOUS | Status: DC

## 2011-06-27 MED ORDER — SODIUM CHLORIDE 0.9 % IV BOLUS (SEPSIS)
1000.0000 mL | Freq: Once | INTRAVENOUS | Status: AC
  Administered 2011-06-27: 1000 mL via INTRAVENOUS

## 2011-06-27 MED ORDER — IPRATROPIUM BROMIDE 0.02 % IN SOLN
0.5000 mg | RESPIRATORY_TRACT | Status: DC
  Administered 2011-06-28 – 2011-06-29 (×9): 0.5 mg via RESPIRATORY_TRACT
  Filled 2011-06-27 (×9): qty 2.5

## 2011-06-27 MED ORDER — SUCCINYLCHOLINE CHLORIDE 20 MG/ML IJ SOLN
INTRAMUSCULAR | Status: AC
  Filled 2011-06-27: qty 1

## 2011-06-27 MED ORDER — ENOXAPARIN SODIUM 40 MG/0.4ML ~~LOC~~ SOLN: 40.0000 mg | SUBCUTANEOUS | Status: DC

## 2011-06-27 MED ORDER — MOXIFLOXACIN HCL IN NACL 400 MG/250ML IV SOLN
INTRAVENOUS | Status: AC
  Filled 2011-06-27: qty 250

## 2011-06-27 NOTE — ED Notes (Signed)
Pt resting, more visibly at ease, sister at bedside. Awaiting bed assignment.

## 2011-06-27 NOTE — Progress Notes (Signed)
Noted Kristin Wells's ED admission. I have been following Kristin Wells in the home since October 2012. She is also followed by social work in the home. I saw Kristin Wells in the home on 2/12 and last spoke with her at the end of last week. I have a detailed history on Kristin Wells as well as a list of current medications. She does not have family or close friend/contact. Kristin Wells left a voice mail for me on Sunday. I attempted to contact her x 2 today and made a visit to her residence. Please note that Kristin Wells has a history of frequent panic attacks. She takes Paxil 30mg  qam and Xanax .5mg  tid-qid in addition to prn Oxycodone, Trazadone, and Flexeril. I am happy to assist with any information that may be of help in caring for Kristin Wells. Please contact me directly at [phone].

## 2011-06-27 NOTE — ED Notes (Signed)
Report called to [facility] for ICU bed 4. Nurse ready for pt.

## 2011-06-27 NOTE — ED Notes (Signed)
Soft wrist retraints applied to prevent pt from pulling out ET tube. Circulation maintained, cap refill <3. Will continue to monitor.

## 2011-06-27 NOTE — ED Notes (Signed)
Pt fighting vent, sister at bedside, MD notified

## 2011-06-27 NOTE — ED Notes (Signed)
Patient arrives via EMS with c/o respiratory distress. EMS reports they were told patient was fine earlier today and friends found her in respiratory distress. EMS reports wheezing and was found cyanotic around lips. Patient given duo neb by EMS in route to ED. Initial o2 sats were 45 % on RA. Current O2 sat 93 % after treatment and on 10 L on facemask. HR 143 currently.

## 2011-06-27 NOTE — ED Notes (Signed)
Patient is awake. RN Tammy Sours aware and talking with patient.

## 2011-06-27 NOTE — ED Notes (Signed)
Patient prepared for intubation by Dr Ignacia Palma. Patient placed on crash cart monitor and remains on cardiac monitor in room. Dr Prudencio Pair, RT, Crystal, RN, Dawnetta Copenhaver, RN, Reuel Boom, RN, and Jillyn Hidden, RN at bedside for intubation. Patient being bagged with BVM at 100% oxygen by RT. See MAR for medication administration.   ABG critical values given to Billy Rocco, Charity fundraiser by [employer], RT. Shown to Dr Ignacia Palma.

## 2011-06-27 NOTE — H&P (Signed)
Kristin Wells is an 61 y.o. female.    PCP: Dorrene German, MD, MD   Chief Complaint: Acute respiratory distress.  HPI: This is a 61 year old, Caucasian female, with a past medical history of COPD, anxiety disorder, and depression, along with chronic pain. She was apparently in her usual state of health earlier today when she was at home. According to the sister, who is at the bedside, patient started having wheezing. It is unclear if she sought attention from her primary care physician in the last few days. However, by the time EMS got to the house, after the patient called them for difficulty breathing, patient was found to be in respiratory distress. She was hypoxic. She was brought into the ED after being given a nebulizer treatment and by that time patient was unresponsive. Patient was emergently intubated. Her sister Kristin Wells is at the bedside. She's unable to provide much history. No history is available from the patient. Her sister Kristin Wells's phone numbers are [phone] which is her cell phone and her home phone is [phone].   Home Medications: Prior to Admission medications   Medication Sig Start Date End Date Taking? Authorizing Provider  albuterol (PROVENTIL HFA;VENTOLIN HFA) 108 (90 BASE) MCG/ACT inhaler Inhale 2 puffs into the lungs every 6 (six) hours as needed. Wheezing/shortness of breath     Historical Provider, MD  albuterol (PROVENTIL HFA;VENTOLIN HFA) 108 (90 BASE) MCG/ACT inhaler Inhale 1-2 puffs into the lungs every 6 (six) hours as needed for wheezing. 01/12/11 01/12/12  [name], MD  albuterol (PROVENTIL) (2.5 MG/3ML) 0.083% nebulizer solution Take 2.5 mg by nebulization every 6 (six) hours as needed.      Historical Provider, MD  ALPRAZolam [name]) 0.5 MG tablet Take 0.5 mg by mouth 4 (four) times daily as needed.      Historical Provider, MD  cyclobenzaprine (FLEXERIL) 10 MG tablet Take 5 mg by mouth 2 (two) times daily.      Historical Provider, MD  Fluticasone-Salmeterol  (ADVAIR) 250-50 MCG/DOSE AEPB Inhale 1 puff into the lungs every 12 (twelve) hours.      Historical Provider, MD  gabapentin (NEURONTIN) 300 MG capsule Take 300 mg by mouth 2 (two) times daily.      Historical Provider, MD  Multiple Vitamins-Minerals (WOMENS BONE HEALTH) TABS Take 1 tablet by mouth daily.      Historical Provider, MD  oxybutynin (DITROPAN) 5 MG tablet Take 5 mg by mouth 2 (two) times daily.      Historical Provider, MD  oxyCODONE-acetaminophen (PERCOCET) 5-325 MG per tablet Take 0.5 tablets by mouth every 4 (four) hours as needed. Back spurs     Historical Provider, MD  PARoxetine (PAXIL) 30 MG tablet Take 30 mg by mouth every morning.      Historical Provider, MD  tiotropium (SPIRIVA) 18 MCG inhalation capsule Place 18 mcg into inhaler and inhale daily.      Historical Provider, MD  traZODone (DESYREL) 100 MG tablet Take 100 mg by mouth at bedtime.      Historical Provider, MD  zolpidem (AMBIEN) 5 MG tablet Take 2 tablets (10 mg total) by mouth at bedtime as needed for sleep. 01/12/11 01/12/12  [name], MD    Allergies: No Known Allergies  Past Medical History: Past Medical History  Diagnosis Date  . COPD (chronic obstructive pulmonary disease)   . Asthma   . Bronchitis   . Anxiety   . Back pain     Past Surgical History  Procedure Date  .  Abdominal hysterectomy     Social History:  reports that she quit smoking about 7 months ago. She does not have any smokeless tobacco history on file. She reports that she does not drink alcohol or use illicit drugs.  Family History: Unable to obtain as the patient is intubated.  Review of Systems -  unobtainable from patient due to intubation  Physical Examination Blood pressure 100/84, pulse 127, resp. rate 26, weight 62.596 kg (138 lb), SpO2 98.00%.  General appearance: She is intubated, and partially sedated Head: Normocephalic, without obvious abnormality, atraumatic Eyes: conjunctivae/corneas clear. PERRL, EOM's intact.  Fundi benign. Neck: no adenopathy, no carotid bruit, no JVD, supple, symmetrical, trachea midline and thyroid not enlarged, symmetric, no tenderness/mass/nodules Resp: Diffuse wheezing is heard bilaterally. No definite crackles. Cardio: S1-S2, slightly tachycardic, regular no S3, S4. No rubs, murmurs, or bruit, no pedal edema. No JVD. GI: soft, non-tender; bowel sounds normal; no masses,  no organomegaly Extremities: extremities normal, atraumatic, no cyanosis or edema Pulses: 2+ and symmetric Skin: Skin color, texture, turgor normal. No rashes or lesions Lymph nodes: Cervical, supraclavicular, and axillary nodes normal. Neurologic: Patient is intubated, and partially sedated. She is moving all 4 extremities. Because she could pull out the ET tube her wrists were restrained.  Laboratory Data: Results for orders placed during the hospital encounter of 06/27/11 (from the past 48 hour(s))  BLOOD GAS, ARTERIAL     Status: Abnormal   Collection Time   06/27/11  6:12 PM      Component Value Range Comment   O2 Content 10.0      Delivery systems OXYGEN MASK      pH, Arterial 6.893 (*) 7.350 - 7.400     pCO2 arterial 147.0 (*) 35.0 - 45.0 (mmHg)    pO2, Arterial 116.0 (*) 80.0 - 100.0 (mmHg)    Bicarbonate 26.9 (*) 20.0 - 24.0 (mEq/L)    TCO2 27.7  0 - 100 (mmol/L)    Acid-base deficit 5.6 (*) 0.0 - 2.0 (mmol/L)    O2 Saturation 94.7      Patient temperature 37.0      Collection site LEFT RADIAL      Drawn by [phone]      Sample type ARTERIAL      Allens test (pass/fail) PASS  PASS    PRO B NATRIURETIC PEPTIDE     Status: Abnormal   Collection Time   06/27/11  6:43 PM      Component Value Range Comment   Pro B Natriuretic peptide (BNP) 373.8 (*) 0 - 125 (pg/mL)   URINALYSIS, ROUTINE W REFLEX MICROSCOPIC     Status: Abnormal   Collection Time   06/27/11  6:53 PM      Component Value Range Comment   Color, Urine YELLOW  YELLOW     APPearance CLEAR  CLEAR     Specific Gravity, Urine 1.015   1.005 - 1.030     pH 6.0  5.0 - 8.0     Glucose, UA NEGATIVE  NEGATIVE (mg/dL)    Hgb urine dipstick SMALL (*) NEGATIVE     Bilirubin Urine NEGATIVE  NEGATIVE     Ketones, ur NEGATIVE  NEGATIVE (mg/dL)    Protein, ur 604 (*) NEGATIVE (mg/dL)    Urobilinogen, UA 0.2  0.0 - 1.0 (mg/dL)    Nitrite NEGATIVE  NEGATIVE     Leukocytes, UA NEGATIVE  NEGATIVE    URINE MICROSCOPIC-ADD ON     Status: Abnormal   Collection Time  06/27/11  6:53 PM      Component Value Range Comment   Squamous Epithelial / LPF RARE  RARE     WBC, UA 0-2  <3 (WBC/hpf)    RBC / HPF 0-2  <3 (RBC/hpf)    Bacteria, UA RARE  RARE     Casts GRANULAR CAST (*) NEGATIVE    POCT I-STAT TROPONIN I     Status: Normal   Collection Time   06/27/11  6:56 PM      Component Value Range Comment   Troponin i, poc 0.03  0.00 - 0.08 (ng/mL)    Comment 3            CBC     Status: Abnormal   Collection Time   06/27/11  7:08 PM      Component Value Range Comment   WBC 13.9 (*) 4.0 - 10.5 (K/uL)    RBC 4.79  3.87 - 5.11 (MIL/uL)    Hemoglobin 13.8  12.0 - 15.0 (g/dL)    HCT 16.1  09.6 - 04.5 (%)    MCV 91.0  78.0 - 100.0 (fL)    MCH 28.8  26.0 - 34.0 (pg)    MCHC 31.7  30.0 - 36.0 (g/dL)    RDW 40.9  81.1 - 91.4 (%)    Platelets 385  150 - 400 (K/uL)   DIFFERENTIAL     Status: Abnormal   Collection Time   06/27/11  7:08 PM      Component Value Range Comment   Neutrophils Relative 56  43 - 77 (%)    Neutro Abs 7.9 (*) 1.7 - 7.7 (K/uL)    Lymphocytes Relative 35  12 - 46 (%)    Lymphs Abs 4.8 (*) 0.7 - 4.0 (K/uL)    Monocytes Relative 4  3 - 12 (%)    Monocytes Absolute 0.5  0.1 - 1.0 (K/uL)    Eosinophils Relative 5  0 - 5 (%)    Eosinophils Absolute 0.7  0.0 - 0.7 (K/uL)    Basophils Relative 0  0 - 1 (%)    Basophils Absolute 0.1  0.0 - 0.1 (K/uL)   BASIC METABOLIC PANEL     Status: Abnormal   Collection Time   06/27/11  7:08 PM      Component Value Range Comment   Sodium 142  135 - 145 (mEq/L)    Potassium 5.1  3.5 -  5.1 (mEq/L)    Chloride 101  96 - 112 (mEq/L)    CO2 27  19 - 32 (mEq/L)    Glucose, Bld 349 (*) 70 - 99 (mg/dL)    BUN 8  6 - 23 (mg/dL)    Creatinine, Ser 7.82  0.50 - 1.10 (mg/dL)    Calcium 9.7  8.4 - 10.5 (mg/dL)    GFR calc non Af Amer 63 (*) >90 (mL/min)    GFR calc Af Amer 74 (*) >90 (mL/min)   D-DIMER, QUANTITATIVE     Status: Abnormal   Collection Time   06/27/11  7:08 PM      Component Value Range Comment   D-Dimer, Quant 2.45 (*) 0.00 - 0.48 (ug/mL-FEU)   LACTIC ACID, PLASMA     Status: Abnormal   Collection Time   06/27/11  7:08 PM      Component Value Range Comment   Lactic Acid, Venous 4.1 (*) 0.5 - 2.2 (mmol/L)     Radiology Reports: Dg Chest Port 1 View  06/27/2011  *RADIOLOGY  REPORT*  Clinical Data: Intubated.  Smoker.  COPD.  Dyspnea.  PORTABLE CHEST - 1 VIEW  Comparison: 05/26/2011.  Findings: Endotracheal tube in satisfactory position.  Nasogastric tube extending into the stomach.  Stable normal sized heart.  The lungs are clear and mildly hyperexpanded with stable mildly prominent interstitial markings.  Unremarkable bones.  IMPRESSION:  1.  Endotracheal tube in satisfactory position. 2.  COPD.  Original Report Authenticated By: Darrol Angel, M.D.    Electrocardiogram: EKG shows a sinus tachycardia at 142 beats per minute. Axis, appears to be normal. Intervals appear to be in the normal range. No Q waves. No concerning ST or T-wave changes are noted.  Assessment/Plan  Principal Problem:  *Acute respiratory failure Active Problems:  ANXIETY  DEPRESSION  Other chronic pain  COPD exacerbation   #1 acute respiratory failure with COPD exacerbation. Patient is currently intubated and mechanically ventilated. She has hypercapnia based on her ABG. She will be admitted to the intensive care unit and will be put on steroids, nebulizer treatments, and antibiotics. Her d-dimer is elevated significantly and so, a CT angio of her chest will also be obtained. Dr. Juanetta Gosling  will be consulted to manage the ventilator. She'll be sedated. Ventilator protocols to be initiated.  #2 anxiety disorder, and depression: This appears to be a significant issue. Depending on her mood after she is extubated behavioral health evaluation may be necessary.  #3 chronic pain on chronic narcotics at home.  #4 hyperglycemia: She does not have any history of diabetes. She'll be put on a sliding scale. Because she'll be kept on, steroids she'll be given basal insulin. HbA1c will be checked.  She's a full code. DVT, prophylaxis will be initiated.  Further management decisions will depend on results of further testing and patient's response to treatment  [facility] Pager [phone]  06/27/2011, 9:19 PM

## 2011-06-27 NOTE — ED Notes (Signed)
Report received from [employer]. VS noted to be stable at this time, PT on vent, has foley in place, 2 IV's, OG tube and NS bolus running. Urine in drainage tube but none in the bag. Approx in suction canister from OG tube. Pt currently not fighting vent but will monitor.

## 2011-06-27 NOTE — ED Notes (Signed)
Patient intubated with 7.0 ETT by Dr Ignacia Palma at [phone]. Positive color change on CO2 detector. Secured at 22 cm at the lip.

## 2011-06-27 NOTE — ED Provider Notes (Signed)
This chart was scribed for Carleene Cooper III, MD by Wallis Mart. The patient was seen in room APA01/APA01 and the patient's care was started at 6:20 PM.   CSN: [id number]  Arrival date & time 06/27/11  1804   None     Chief Complaint  Patient presents with  . Respiratory Distress    (Consider location/radiation/quality/duration/timing/severity/associated sxs/prior treatment) The history is provided by the EMS personnel. The history is limited by the condition of the patient.    Level 5 caveat for state of Pt (respiratory distress) Kristin Wells is a 61 y.o. female who presents to the Emergency Department via EMS complaining of  respiratory distress. EMS reports they were told patient was fine earlier today and friends found her in respiratory distress. EMS reports wheezing and was found cyanotic around lips. Patient given duo neb by EMS in route to ED. Initial o2 sats were 45 % on RA. Pt was unresponsive upon arrival so an emergent intubation performed by ED.    Pt w/ h/o COPD, asthma, bronchitis.    Past Medical History  Diagnosis Date  . COPD (chronic obstructive pulmonary disease)   . Asthma   . Bronchitis   . Anxiety   . Back pain     Past Surgical History  Procedure Date  . Abdominal hysterectomy     No family history on file.  History  Substance Use Topics  . Smoking status: Former Smoker    Quit date: 11/14/2010  . Smokeless tobacco: Not on file  . Alcohol Use: No    OB History    Grav Para Term Preterm Abortions TAB SAB Ect Mult Living                  Review of Systems  Unable to perform due to state of pt (respiratory distress) Allergies  Review of patient's allergies indicates no known allergies.  Home Medications   Current Outpatient Rx  Name Route Sig Dispense Refill  . ALBUTEROL SULFATE HFA 108 (90 BASE) MCG/ACT IN AERS Inhalation Inhale 2 puffs into the lungs every 6 (six) hours as needed. Wheezing/shortness of breath     .  ALBUTEROL SULFATE HFA 108 (90 BASE) MCG/ACT IN AERS Inhalation Inhale 1-2 puffs into the lungs every 6 (six) hours as needed for wheezing. 1 Inhaler 0  . ALBUTEROL SULFATE (2.5 MG/3ML) 0.083% IN NEBU Nebulization Take 2.5 mg by nebulization every 6 (six) hours as needed.      . ALPRAZOLAM 0.5 MG PO TABS Oral Take 0.5 mg by mouth 4 (four) times daily as needed.      . CYCLOBENZAPRINE HCL 10 MG PO TABS Oral Take 5 mg by mouth 2 (two) times daily.      Marland Kitchen FLUTICASONE-SALMETEROL 250-50 MCG/DOSE IN AEPB Inhalation Inhale 1 puff into the lungs every 12 (twelve) hours.      Marland Kitchen GABAPENTIN 300 MG PO CAPS Oral Take 300 mg by mouth 2 (two) times daily.      . WOMENS BONE HEALTH PO TABS Oral Take 1 tablet by mouth daily.      . OXYBUTYNIN CHLORIDE 5 MG PO TABS Oral Take 5 mg by mouth 2 (two) times daily.      . OXYCODONE-ACETAMINOPHEN 5-325 MG PO TABS Oral Take 0.5 tablets by mouth every 4 (four) hours as needed. Back spurs     . PAROXETINE HCL 30 MG PO TABS Oral Take 30 mg by mouth every morning.      Marland Kitchen TIOTROPIUM BROMIDE  MONOHYDRATE 18 MCG IN CAPS Inhalation Place 18 mcg into inhaler and inhale daily.      . TRAZODONE HCL 100 MG PO TABS Oral Take 100 mg by mouth at bedtime.      Marland Kitchen ZOLPIDEM TARTRATE 5 MG PO TABS Oral Take 2 tablets (10 mg total) by mouth at bedtime as needed for sleep. 15 tablet 0    BP 155/115  Pulse 143  Resp 31  Wt 138 lb (62.596 kg)  SpO2 93%  Physical Exam  Nursing note and vitals reviewed. Constitutional: She appears well-developed and well-nourished. She appears distressed.       ALOC  HENT:  Head: Normocephalic and atraumatic.  Eyes: EOM are normal. Pupils are equal, round, and reactive to light.  Neck: Normal range of motion. Neck supple. No tracheal deviation present.  Cardiovascular: Exam reveals no friction rub.   No murmur heard.      Irregular tachycardic   Pulmonary/Chest: She is in respiratory distress. She exhibits no tenderness.       Breath sounds distant    Abdominal: Soft. She exhibits no distension.  Musculoskeletal: Normal range of motion. She exhibits no edema.  Neurological: She is alert. No sensory deficit.       Unresponsive to verbal or painful stimuli  Skin: Skin is warm and dry.  Psychiatric: She has a normal mood and affect. Her behavior is normal.    ED Course  Procedures (including critical care time) DIAGNOSTIC STUDIES: Oxygen Saturation is 93% on [place], low by my interpretation.    COORDINATION OF CARE:  CRITICAL CARE NOTE: Critical care time was provided for 30 minutes exclusive of separately billable procedures and treating other patients.  This was necessary to treat or prevent further deterioration of the following condition(s) Acute respiratory failure which the patient had and/or had a high probability of suddenly developing. This involved direct bedside patient care, speaking with family members, review of past medical records, reviewing the results of the laboratory and diagnostic studies, consulting with other physicians, as well as evaluating the effectiveness of the therapy instituted as described.   6:30 PM  EMERGENT INTUBATION PROCEDURE NOTE INDICATION: respiratory failure  TECHNIQUE: Unable to obtain consent because of altered level of consciousness.  After pre-oxygenating the patient for 3 minutes, a modified rapid-sequence induction was performed using etomidate and succinylcholine with cricoid pressure. Using a Miller 4 laryngoscope blade and 7.46mm cuffed endotracheal tube was placed and secured.  Placement was confirmed with by auscultation and ETCO2 monitor .  COMPLICATIONS: None. The patient tolerated the procedure well with no complications. POST PROCEDURE CXR: pending    Labs Reviewed  BLOOD GAS, ARTERIAL - Abnormal; Notable for the following:    pH, Arterial 6.893 (*)    pCO2 arterial 147.0 (*)    pO2, Arterial 116.0 (*)    Bicarbonate 26.9 (*)    Acid-base deficit 5.6 (*)    All other components  within normal limits  CBC - Abnormal; Notable for the following:    WBC 13.9 (*)    All other components within normal limits  DIFFERENTIAL - Abnormal; Notable for the following:    Neutro Abs 7.9 (*)    Lymphs Abs 4.8 (*)    All other components within normal limits  D-DIMER, QUANTITATIVE - Abnormal; Notable for the following:    D-Dimer, Quant 2.45 (*)    All other components within normal limits  PRO B NATRIURETIC PEPTIDE - Abnormal; Notable for the following:    Pro B  Natriuretic peptide (BNP) 373.8 (*)    All other components within normal limits  POCT I-STAT TROPONIN I  BASIC METABOLIC PANEL  URINALYSIS, ROUTINE W REFLEX MICROSCOPIC  URINE CULTURE  LACTIC ACID, PLASMA  PROCALCITONIN  CULTURE, BLOOD (ROUTINE X 2)  CULTURE, BLOOD (ROUTINE X 2)   Dg Chest Port 1 View  06/27/2011  *RADIOLOGY REPORT*  Clinical Data: Intubated.  Smoker.  COPD.  Dyspnea.  PORTABLE CHEST - 1 VIEW  Comparison: 05/26/2011.  Findings: Endotracheal tube in satisfactory position.  Nasogastric tube extending into the stomach.  Stable normal sized heart.  The lungs are clear and mildly hyperexpanded with stable mildly prominent interstitial markings.  Unremarkable bones.  IMPRESSION:  1.  Endotracheal tube in satisfactory position. 2.  COPD.  Original Report Authenticated By: Darrol Angel, M.D.    Date: 06/27/2011  Rate:142  Rhythm: sinus tachycardia  QRS Axis: right  Intervals: normal QRS:  Low voltage in limb leads.  ST/T Wave abnormalities: normal  Conduction Disutrbances:none  Narrative Interpretation: Abnormal EKG.   Old EKG Reviewed: changes notedAxis has changed from left to right.  8:09 PM Pt with tachycardia of 125, doing better on the ventilator. No local MD.  Case discussed with Dr. Rito Ehrlich who will admit pt to the ICU.  1. Acute respiratory failure   2. COPD (chronic obstructive pulmonary disease)     I personally performed the services described in this documentation, which was  scribed in my presence. The recorded information has been reviewed and considered.  Osvaldo Human, M.D.    Carleene Cooper III, MD 06/27/11 2015  Carleene Cooper III, MD 06/28/11 [phone]

## 2011-06-27 NOTE — ED Notes (Signed)
ICU unable to take report, nurse will call when available

## 2011-06-28 ENCOUNTER — Inpatient Hospital Stay (HOSPITAL_COMMUNITY): Payer: PRIVATE HEALTH INSURANCE

## 2011-06-28 ENCOUNTER — Encounter (HOSPITAL_COMMUNITY): Payer: Self-pay | Admitting: *Deleted

## 2011-06-28 DIAGNOSIS — I319 Disease of pericardium, unspecified: Secondary | ICD-10-CM

## 2011-06-28 DIAGNOSIS — R739 Hyperglycemia, unspecified: Secondary | ICD-10-CM | POA: Diagnosis present

## 2011-06-28 DIAGNOSIS — I255 Ischemic cardiomyopathy: Secondary | ICD-10-CM

## 2011-06-28 DIAGNOSIS — E049 Nontoxic goiter, unspecified: Secondary | ICD-10-CM

## 2011-06-28 DIAGNOSIS — I214 Non-ST elevation (NSTEMI) myocardial infarction: Secondary | ICD-10-CM

## 2011-06-28 HISTORY — DX: Nontoxic goiter, unspecified: E04.9

## 2011-06-28 HISTORY — DX: Ischemic cardiomyopathy: I25.5

## 2011-06-28 LAB — BLOOD GAS, ARTERIAL
Acid-base deficit: 0.6 mmol/L (ref 0.0–2.0)
Acid-base deficit: 2.2 mmol/L — ABNORMAL HIGH (ref 0.0–2.0)
Bicarbonate: 22.7 mEq/L (ref 20.0–24.0)
Bicarbonate: 25.3 mEq/L — ABNORMAL HIGH (ref 20.0–24.0)
Drawn by: 21694
Drawn by: 21694
FIO2: 45 %
FIO2: 60 %
MECHVT: 400 mL
MECHVT: 400 mL
O2 Content: 45 L/min
O2 Content: 60 L/min
O2 Saturation: 97.1 %
O2 Saturation: 99.1 %
PEEP: 5 cmH2O
PEEP: 5 cmH2O
Patient temperature: 37
Patient temperature: 37
RATE: 16 resp/min
RATE: 16 resp/min
TCO2: 20.2 mmol/L (ref 0–100)
TCO2: 22.9 mmol/L (ref 0–100)
pCO2 arterial: 43.7 mmHg (ref 35.0–45.0)
pCO2 arterial: 55.4 mmHg — ABNORMAL HIGH (ref 35.0–45.0)
pH, Arterial: 7.281 — ABNORMAL LOW (ref 7.350–7.400)
pH, Arterial: 7.337 — ABNORMAL LOW (ref 7.350–7.400)
pO2, Arterial: 178 mmHg — ABNORMAL HIGH (ref 80.0–100.0)
pO2, Arterial: 92 mmHg (ref 80.0–100.0)

## 2011-06-28 LAB — GLUCOSE, CAPILLARY
Glucose-Capillary: 133 mg/dL — ABNORMAL HIGH (ref 70–99)
Glucose-Capillary: 170 mg/dL — ABNORMAL HIGH (ref 70–99)
Glucose-Capillary: 181 mg/dL — ABNORMAL HIGH (ref 70–99)
Glucose-Capillary: 186 mg/dL — ABNORMAL HIGH (ref 70–99)
Glucose-Capillary: 197 mg/dL — ABNORMAL HIGH (ref 70–99)
Glucose-Capillary: 202 mg/dL — ABNORMAL HIGH (ref 70–99)
Glucose-Capillary: 214 mg/dL — ABNORMAL HIGH (ref 70–99)

## 2011-06-28 LAB — CBC
HCT: 42.1 % (ref 36.0–46.0)
Hemoglobin: 13.2 g/dL (ref 12.0–15.0)
MCH: 28.2 pg (ref 26.0–34.0)
MCHC: 31.4 g/dL (ref 30.0–36.0)
MCV: 90 fL (ref 78.0–100.0)
Platelets: 311 10*3/uL (ref 150–400)
RBC: 4.68 MIL/uL (ref 3.87–5.11)
RDW: 14.8 % (ref 11.5–15.5)
WBC: 11.4 10*3/uL — ABNORMAL HIGH (ref 4.0–10.5)

## 2011-06-28 LAB — CARDIAC PANEL(CRET KIN+CKTOT+MB+TROPI)
CK, MB: 7.1 ng/mL (ref 0.3–4.0)
CK, MB: 9 ng/mL (ref 0.3–4.0)
CK, MB: 8.5 ng/mL (ref 0.3–4.0)
Relative Index: 7 — ABNORMAL HIGH (ref 0.0–2.5)
Relative Index: 8.2 — ABNORMAL HIGH (ref 0.0–2.5)
Relative Index: 7.6 — ABNORMAL HIGH (ref 0.0–2.5)
Total CK: 102 U/L (ref 7–177)
Total CK: 110 U/L (ref 7–177)
Total CK: 112 U/L (ref 7–177)
Troponin I: 1.6 ng/mL (ref <0.30)
Troponin I: 1.95 ng/mL (ref <0.30)
Troponin I: 1.69 ng/mL (ref <0.30)

## 2011-06-28 LAB — HEMOGLOBIN A1C
Hgb A1c MFr Bld: 5.9 % — ABNORMAL HIGH (ref <5.7)
Mean Plasma Glucose: 123 mg/dL — ABNORMAL HIGH (ref <117)

## 2011-06-28 LAB — MRSA PCR SCREENING: MRSA by PCR: NEGATIVE

## 2011-06-28 LAB — BASIC METABOLIC PANEL
BUN: 10 mg/dL (ref 6–23)
CO2: 25 mEq/L (ref 19–32)
Calcium: 8.9 mg/dL (ref 8.4–10.5)
Chloride: 105 mEq/L (ref 96–112)
Creatinine, Ser: 0.92 mg/dL (ref 0.50–1.10)
GFR calc Af Amer: 77 mL/min — ABNORMAL LOW (ref >90)
GFR calc non Af Amer: 67 mL/min — ABNORMAL LOW (ref >90)
Glucose, Bld: 238 mg/dL — ABNORMAL HIGH (ref 70–99)
Potassium: 4.7 mEq/L (ref 3.5–5.1)
Sodium: 141 mEq/L (ref 135–145)

## 2011-06-28 LAB — PROCALCITONIN: Procalcitonin: 11.01 ng/mL

## 2011-06-28 LAB — TSH: TSH: 1.325 u[IU]/mL (ref 0.350–4.500)

## 2011-06-28 LAB — LACTIC ACID, PLASMA: Lactic Acid, Venous: 3.8 mmol/L — ABNORMAL HIGH (ref 0.5–2.2)

## 2011-06-28 LAB — MAGNESIUM: Magnesium: 2.2 mg/dL (ref 1.5–2.5)

## 2011-06-28 LAB — T4, FREE: Free T4: 1 ng/dL (ref 0.80–1.80)

## 2011-06-28 MED ORDER — CHLORHEXIDINE GLUCONATE 0.12 % MT SOLN
15.0000 mL | Freq: Two times a day (BID) | OROMUCOSAL | Status: DC
  Administered 2011-06-28 – 2011-06-29 (×4): 15 mL via OROMUCOSAL
  Filled 2011-06-28 (×3): qty 15

## 2011-06-28 MED ORDER — LORAZEPAM 2 MG/ML IJ SOLN
1.0000 mg | INTRAMUSCULAR | Status: DC | PRN
  Administered 2011-06-28 – 2011-06-29 (×4): 1 mg via INTRAVENOUS
  Filled 2011-06-28 (×4): qty 1

## 2011-06-28 MED ORDER — VANCOMYCIN HCL 1000 MG IV SOLR
750.0000 mg | Freq: Two times a day (BID) | INTRAVENOUS | Status: DC
  Administered 2011-06-28 (×2): 750 mg via INTRAVENOUS
  Filled 2011-06-28 (×3): qty 750

## 2011-06-28 MED ORDER — FUROSEMIDE 10 MG/ML IJ SOLN
20.0000 mg | Freq: Once | INTRAMUSCULAR | Status: AC
  Administered 2011-06-28: 20 mg via INTRAVENOUS
  Filled 2011-06-28: qty 2

## 2011-06-28 MED ORDER — ASPIRIN 325 MG PO TABS
325.0000 mg | ORAL_TABLET | Freq: Every day | ORAL | Status: DC
  Administered 2011-06-28 – 2011-07-05 (×8): 325 mg via NASOGASTRIC
  Filled 2011-06-28 (×8): qty 1

## 2011-06-28 MED ORDER — PNEUMOCOCCAL VAC POLYVALENT 25 MCG/0.5ML IJ INJ
0.5000 mL | INJECTION | INTRAMUSCULAR | Status: AC
  Administered 2011-06-29: 0.5 mL via INTRAMUSCULAR
  Filled 2011-06-28: qty 0.5

## 2011-06-28 MED ORDER — INSULIN GLARGINE 100 UNIT/ML ~~LOC~~ SOLN
15.0000 [IU] | Freq: Two times a day (BID) | SUBCUTANEOUS | Status: DC
  Administered 2011-06-28 – 2011-06-29 (×4): 15 [IU] via SUBCUTANEOUS
  Filled 2011-06-28: qty 3

## 2011-06-28 MED ORDER — BIOTENE DRY MOUTH MT LIQD
15.0000 mL | Freq: Four times a day (QID) | OROMUCOSAL | Status: DC
  Administered 2011-06-28 – 2011-06-29 (×6): 15 mL via OROMUCOSAL

## 2011-06-28 MED ORDER — IOHEXOL 350 MG/ML SOLN
100.0000 mL | Freq: Once | INTRAVENOUS | Status: AC | PRN
  Administered 2011-06-28: 100 mL via INTRAVENOUS

## 2011-06-28 MED ORDER — ENOXAPARIN SODIUM 40 MG/0.4ML ~~LOC~~ SOLN
40.0000 mg | SUBCUTANEOUS | Status: DC
  Administered 2011-06-29 – 2011-07-05 (×7): 40 mg via SUBCUTANEOUS
  Filled 2011-06-28 (×7): qty 0.4

## 2011-06-28 MED ORDER — INFLUENZA VIRUS VACC SPLIT PF IM SUSP
0.5000 mL | INTRAMUSCULAR | Status: AC
  Administered 2011-06-29: 0.5 mL via INTRAMUSCULAR
  Filled 2011-06-28: qty 0.5

## 2011-06-28 MED ORDER — ENOXAPARIN SODIUM 80 MG/0.8ML ~~LOC~~ SOLN
65.0000 mg | Freq: Two times a day (BID) | SUBCUTANEOUS | Status: DC
  Administered 2011-06-28 (×2): 65 mg via SUBCUTANEOUS
  Filled 2011-06-28 (×2): qty 0.8

## 2011-06-28 MED ORDER — ENALAPRILAT 1.25 MG/ML IV SOLN
1.2500 mg | Freq: Four times a day (QID) | INTRAVENOUS | Status: DC
  Administered 2011-06-28 – 2011-06-29 (×2): 1.25 mg via INTRAVENOUS
  Filled 2011-06-28 (×3): qty 2

## 2011-06-28 NOTE — Progress Notes (Signed)
ANTICOAGULATION CONSULT NOTE - Initial Consult  Pharmacy Consult for Enoxaparin Indication: chest pain/ACS  No Known Allergies  Patient Measurements: Height: 5\' 2"  (157.5 cm) Weight: 147 lb 11.3 oz (67 kg) IBW/kg (Calculated) : 50.1  Enoxaparin Dosing Weight: 67 kg Vital Signs: Temp: 98.2 F (36.8 C) (02/19 0500) Temp src: Oral (02/19 0500) BP: 115/91 mmHg (02/19 0615) Pulse Rate: 110  (02/19 0615)  Labs:  Basename 06/28/11 0427 06/27/11 2322 06/27/11 1908  HGB 13.2 -- 13.8  HCT 42.1 -- 43.6  PLT 311 -- 385  APTT -- -- --  LABPROT -- -- --  INR -- -- --  HEPARINUNFRC -- -- --  CREATININE 0.92 -- 0.96  CKTOTAL 110 102 --  CKMB 9.0* 7.1* --  TROPONINI 1.95* 1.60* --   Estimated Creatinine Clearance: 59.1 ml/min (by C-G formula based on Cr of 0.92).  Medical History: Past Medical History  Diagnosis Date  . COPD (chronic obstructive pulmonary disease)   . Asthma   . Bronchitis   . Anxiety   . Back pain     Medications:  Scheduled:    . albuterol  2.5 mg Nebulization Q4H   And  . ipratropium  0.5 mg Nebulization Q4H  . aspirin  325 mg Per NG tube Daily  . enoxaparin (LOVENOX) injection  65 mg Subcutaneous BID  . etomidate      . famotidine (PEPCID) IV  20 mg Intravenous Q12H  . Fluticasone-Salmeterol  1 puff Inhalation Q12H  . insulin aspart  0-20 Units Subcutaneous Q4H  . insulin glargine  10 Units Subcutaneous QHS  . lidocaine (cardiac) 100 mg/59ml      . methylPREDNISolone sodium succinate  125 mg Intravenous Once  . methylPREDNISolone (SOLU-MEDROL) injection  80 mg Intravenous Q6H  . midazolam  4 mg Intravenous Once  . midazolam  2 mg Intravenous Once  . midazolam  4 mg Intravenous Once  . moxifloxacin  400 mg Intravenous Q24H  . PARoxetine  30 mg Oral Daily  . propofol      . sodium chloride  1,000 mL Intravenous Once  . succinylcholine      . DISCONTD: enoxaparin  40 mg Subcutaneous Q24H  . DISCONTD: lidocaine (cardiac) 50 mg/34ml  100 mg  Intravenous Once  . DISCONTD: rocuronium        Assessment: Full anticoagulation. Cardiology workup in progress. Baseline and follow up platelet count stable. No signs of bleeding.  Goal of Therapy:  Full anticoagulation.   Plan:  Enoxaparin 1 mg per kg every 12 hours. CBC daily while on Enoxaparin.  Gilman Buttner, [place] J 06/28/2011,7:58 AM

## 2011-06-28 NOTE — Progress Notes (Signed)
Called with abnormal Troponin Elevated troponin noted. CT angio shows no PE. Patient hemodynamically stable. Acute respiratory distress could have causes cardiac demand ischemia. Will start full dose lovenox. Will give aspirin and obtain Echo in AM.  Consult cardiology as well  Will monitor closely.  Kristin Wells 06/28/2011 1:36 AM

## 2011-06-28 NOTE — Progress Notes (Addendum)
Subjective: The patient is sitting up in bed on the ventilator. Surprisingly, she is alert. She nods appropriately.    And has a cardiac history Objective: Vital signs in last 24 hours: Filed Vitals:   06/28/11 0545 06/28/11 0600 06/28/11 0615 06/28/11 0744  BP: 116/94 115/95 115/91   Pulse: 112 111 110   Temp:      TempSrc:      Resp: 22 23 23    Height:      Weight:      SpO2: 98% 97% 97% 95%    Intake/Output Summary (Last 24 hours) at 06/28/11 0836 Last data filed at 06/28/11 0600  Gross per 24 hour  Intake 797.41 ml  Output    350 ml  Net 447.41 ml    Weight change:   Physical exam: General: Alert 61 year old Caucasian woman who is in no acute distress on the ventilator. Lungs: Bilateral upper lobe rhonchi with occasional wheezes. Heart: S1, S2, with mild tachycardia. Abdomen: Positive bowel sounds, soft, nontender, nondistended. Extremities: Pedal pulses palpable. No pedal edema. Neurologic: She is alert. Cranial nerves II through XII are intact. She follows small commands. She has good bilateral hand grip/5 over 5 strength of her upper extremities. She moves her lower extremities with and without provocation.  Lab Results: Basic Metabolic Panel:  Basename 06/28/11 0427 06/27/11 1908  NA 141 142  K 4.7 5.1  CL 105 101  CO2 25 27  GLUCOSE 238* 349*  BUN 10 8  CREATININE 0.92 0.96  CALCIUM 8.9 9.7  MG 2.2 --  PHOS -- --   Liver Function Tests: No results found for this basename: AST:2,ALT:2,ALKPHOS:2,BILITOT:2,PROT:2,ALBUMIN:2 in the last 72 hours No results found for this basename: LIPASE:2,AMYLASE:2 in the last 72 hours No results found for this basename: AMMONIA:2 in the last 72 hours CBC:  Basename 06/28/11 0427 06/27/11 1908  WBC 11.4* 13.9*  NEUTROABS -- 7.9*  HGB 13.2 13.8  HCT 42.1 43.6  MCV 90.0 91.0  PLT 311 385   Cardiac Enzymes:  Basename 06/28/11 0427 06/27/11 2322  CKTOTAL 110 102  CKMB 9.0* 7.1*  CKMBINDEX -- --  TROPONINI  1.95* 1.60*   BNP:  Basename 06/27/11 1843  PROBNP 373.8*   D-Dimer:  Basename 06/27/11 1908  DDIMER 2.45*   CBG:  Basename 06/28/11 0759 06/28/11 0411 06/28/11 0003 06/27/11 1811  GLUCAP 181* 214* 202* 348*   Hemoglobin A1C: No results found for this basename: HGBA1C in the last 72 hours Fasting Lipid Panel: No results found for this basename: CHOL,HDL,LDLCALC,TRIG,CHOLHDL,LDLDIRECT in the last 72 hours Thyroid Function Tests: No results found for this basename: TSH,T4TOTAL,FREET4,T3FREE,THYROIDAB in the last 72 hours Anemia Panel: No results found for this basename: VITAMINB12,FOLATE,FERRITIN,TIBC,IRON,RETICCTPCT in the last 72 hours Coagulation: No results found for this basename: LABPROT:2,INR:2 in the last 72 hours Urine Drug Screen: Drugs of Abuse     Component Value Date/Time   LABOPIA NONE DETECTED 11/24/2006 1355   COCAINSCRNUR POSITIVE* 11/24/2006 1355   LABBENZ NONE DETECTED 11/24/2006 1355   AMPHETMU NONE DETECTED 11/24/2006 1355   THCU NONE DETECTED 11/24/2006 1355   LABBARB  Value: NONE DETECTED        DRUG SCREEN FOR MEDICAL PURPOSES ONLY.  IF CONFIRMATION IS NEEDED FOR ANY PURPOSE, NOTIFY LAB WITHIN 5 DAYS. 11/24/2006 1355    Alcohol Level: No results found for this basename: ETH:2 in the last 72 hours Urinalysis:  Basename 06/27/11 1853  COLORURINE YELLOW  LABSPEC 1.015  PHURINE 6.0  GLUCOSEU NEGATIVE  HGBUR SMALL*  BILIRUBINUR NEGATIVE  KETONESUR NEGATIVE  PROTEINUR 100*  UROBILINOGEN 0.2  NITRITE NEGATIVE  LEUKOCYTESUR NEGATIVE   Misc. Labs:   Micro: Recent Results (from the past 240 hour(s))  MRSA PCR SCREENING     Status: Normal   Collection Time   06/27/11 11:42 PM      Component Value Range Status Comment   MRSA by PCR NEGATIVE  NEGATIVE  Final     Studies/Results: Ct Angio Chest W/cm &/or Wo Cm  06/28/2011  *RADIOLOGY REPORT*  Clinical Data: Acute respiratory failure; elevated D-dimer. History of asthma.  CT ANGIOGRAPHY CHEST   Technique:  Multidetector CT imaging of the chest using the standard protocol during bolus administration of intravenous contrast. Multiplanar reconstructed images including MIPs were obtained and reviewed to evaluate the vascular anatomy.  Contrast: OMNIPAQUE IOHEXOL 350 MG/ML IV SOLN  Comparison: Chest radiograph performed 06/27/2011, and CTA of the chest performed 01/08/2010  Findings: There is no evidence of pulmonary embolus.  Mild patchy airspace opacity is noted along the medial aspect of the right upper lobe, and minimally in the right middle lobe in a peribronchial distribution.  There is diffuse bilateral peribronchial soft tissue thickening; this may reflect the patient's history of asthma.  Mild bilateral dependent subsegmental atelectasis is noted.  There is no evidence of pleural effusion or pneumothorax.  No masses are identified; no abnormal focal contrast enhancement is seen.  Diffuse coronary artery calcifications are seen.  The mediastinum is otherwise unremarkable in appearance.  Scattered small periaortic, aortopulmonary window and right paratracheal nodes remain borderline normal in size, without definite evidence for mediastinal lymphadenopathy.  The patient's enteric tube is noted extending to the body of the stomach.  The endotracheal tube is seen ending 2 cm above the carina.  No pericardial effusion is seen.  The great vessels are unremarkable in appearance.  No axillary lymphadenopathy is seen. There is vague asymmetric enlargement of the right thyroid lobe, with scattered calcification; this appears grossly stable from the prior CTA performed 2011, though correlation with prior lab findings or thyroid ultrasound would be helpful.  The visualized portions of the liver and spleen are unremarkable. The visualized portions of the pancreas, stomach, adrenal glands and kidneys are within normal limits.  No acute osseous abnormalities are seen.  A lucent lesion within the L1 vertebral  body was seen to reflect a benign vertebral body hemangioma on the prior MRI of the lumbar spine.  Degenerative change is noted at the upper lumbar spine, with diffuse sclerosis.  IMPRESSION:  1.  No evidence of pulmonary embolus. 2.  Mild patchy airspace opacity at the medial aspect of the right upper lobe, and in a peribronchial distribution in the right middle lobe.  This raises concern for mild pneumonia, possibly atypical in nature. 3.  Diffuse bilateral peribronchial soft tissue thickening; this may reflect the patient's history of asthma, but appears worsened from the prior CTA. 4.  Mild bilateral dependent subsegmental atelectasis noted. 5.  Diffuse coronary artery calcifications seen. 6.  Vague asymmetric enlargement of the right thyroid lobe, with scattered calcification.  This appears grossly stable from the CTA performed in 2011, but correlation with prior lab findings or thyroid ultrasound would be helpful.  Original Report Authenticated By: Tonia Ghent, M.D.   Portable Chest Xray In Am  06/28/2011  *RADIOLOGY REPORT*  Clinical Data: Acute respiratory distress, on ventilator  PORTABLE CHEST - 1 VIEW  Comparison: Portable chest x-ray of 06/27/2011  Findings: The tip of the endotracheal tube  is obscured by overlying electrode leads.  The tip appears to be approximately 1.5 cm above the carina.  Prominent markings are noted particularly at the left lung base and pneumonia cannot be excluded.  No effusion is seen. Heart size is stable.  IMPRESSION:  1.  Tip of endotracheal tube is obscured, probably approximately 1.5 cm above the carina. 2. Prominent markings at the left lung base suspicious for pneumonia.  Original Report Authenticated By: Juline Patch, M.D.   Dg Chest Port 1 View  06/27/2011  *RADIOLOGY REPORT*  Clinical Data: Intubated.  Smoker.  COPD.  Dyspnea.  PORTABLE CHEST - 1 VIEW  Comparison: 05/26/2011.  Findings: Endotracheal tube in satisfactory position.  Nasogastric tube extending  into the stomach.  Stable normal sized heart.  The lungs are clear and mildly hyperexpanded with stable mildly prominent interstitial markings.  Unremarkable bones.  IMPRESSION:  1.  Endotracheal tube in satisfactory position. 2.  COPD.  Original Report Authenticated By: Darrol Angel, M.D.    Medications: I have reviewed the patient's current medications.  Assessment: Principal Problem:  *Acute respiratory failure Active Problems:  ANXIETY  DEPRESSION  Other chronic pain  COPD exacerbation  Demand ischemia of myocardium  Hyperglycemia    1. Acute ventilator dependent respiratory failure, presumed to be secondary to COPD exacerbation. She was noted to have hypercarbic/hypercapnic respiratory failure in the emergency department which necessitated intubation. She was noted to have respiratory acidosis. This morning, her pH, ventilation, and oxygenation are much improved. Dr. Juanetta Gosling has been consulted to assist with ventilator management. She is on Avelox and Solu-Medrol. She is on bronchodilator therapy. Will add vancomycin empirically while she's on the ventilator.  Elevated troponin I. This could be secondary to demand ischemia. She may have had a non-ST elevation myocardial infarction. She is noted to have coronary calcifications on the CT scan of her chest. She is on full dose Lovenox and aspirin. Beta blocker will be considered, but her blood pressures on the low-normal side now. Cardiology has been consulted.   Thyroid enlargement. TSH will be ordered.  Hyperglycemia. This may be steroid induced and/or newly diagnosed diabetes mellitus. Hemoglobin A1c will be ordered.  Chronic anxiety and depression. She is treated chronically with alprazolam, Paxil, and trazodone.  Chronic pain syndrome. Opiate dependent.  Plan:  1. Continue ventilator support. Further recommendations per Dr. Juanetta Gosling. Will add vancomycin empirically while she is on the ventilator.  Cardiology has been  consulted. We'll await their recommendations. 2-D echocardiogram has been ordered and it is pending.  Will increase Lantus for better glycemic control. We'll order hemoglobin A1c.  Will order TSH and free T4 to assess thyroid function.  We'll add when necessary Ativan for her history of chronic anxiety.   Total critical care time 40 minutes.      LOS: 1 day   Daielle Melcher 06/28/2011, 8:36 AM

## 2011-06-28 NOTE — Consult Note (Signed)
Consult requested by: Hospitalist attending Consult requested for respiratory failure:  HPI: This is a 61 year old Caucasian female who has a history of COPD. She also has had problems anxiety and depression and chronic pain syndrome. She had been having shortness of breath for several days but then developed increased shortness of breath on the day of admission. She developed respiratory distress and was hypoxic when seen by EMS and she was brought to the emergency room and given a prolonged nebulizer treatment but became unresponsive and was intubated. All of the history is from medical records and she is intubated and there is no family available.  Past Medical History  Diagnosis Date  . COPD (chronic obstructive pulmonary disease)   . Asthma   . Bronchitis   . Anxiety   . Back pain      History reviewed. No pertinent family history.   History   Social History  . Marital Status: Single    Spouse Name: N/A    Number of Children: N/A  . Years of Education: N/A   Social History Main Topics  . Smoking status: Former Smoker    Quit date: 11/14/2010  . Smokeless tobacco: None  . Alcohol Use: No  . Drug Use: No  . Sexually Active:    Other Topics Concern  . None   Social History Narrative  . None     ROS: Unobtainable    Objective: Vital signs in last 24 hours: Temp:  [98.2 F (36.8 C)-98.9 F (37.2 C)] 98.2 F (36.8 C) (02/19 0500) Pulse Rate:  [109-143] 110  (02/19 0615) Resp:  [18-31] 23  (02/19 0615) BP: (95-155)/(59-115) 115/91 mmHg (02/19 0615) SpO2:  [93 %-100 %] 97 % (02/19 0615) FiO2 (%):  [42.8 %-70 %] 44.3 % (02/19 0615) Weight:  [62.596 kg (138 lb)-67 kg (147 lb 11.3 oz)] 67 kg (147 lb 11.3 oz) (02/19 0515) Weight change:  Last BM Date:  (unknown)  Intake/Output from previous day: 02/18 0701 - 02/19 0700 In: 797.4 [I.V.:497.4; IV Piggyback:300] Out: 350 [Urine:350]  PHYSICAL EXAM She is intubated and sedated. She does have restraints on her  wrists. She is not responsive at this point. Her pupils are reactive. Her nose and throat clear. Her neck is supple without masses. Her chest shows end expiratory wheezes bilaterally. Her heart is regular without gallop. She still has a tachycardia. Her abdomen is soft bowel sounds present and active. Extremities showed no edema. Central nervous system exam shows that she can move all 4 extremities but that's as much as I can get as far as an exam because of her intubation and sedation.  Lab Results: Basic Metabolic Panel:  Basename 06/28/11 0427 06/27/11 1908  NA 141 142  K 4.7 5.1  CL 105 101  CO2 25 27  GLUCOSE 238* 349*  BUN 10 8  CREATININE 0.92 0.96  CALCIUM 8.9 9.7  MG 2.2 --  PHOS -- --   Liver Function Tests: No results found for this basename: AST:2,ALT:2,ALKPHOS:2,BILITOT:2,PROT:2,ALBUMIN:2 in the last 72 hours No results found for this basename: LIPASE:2,AMYLASE:2 in the last 72 hours No results found for this basename: AMMONIA:2 in the last 72 hours CBC:  Basename 06/28/11 0427 06/27/11 1908  WBC 11.4* 13.9*  NEUTROABS -- 7.9*  HGB 13.2 13.8  HCT 42.1 43.6  MCV 90.0 91.0  PLT 311 385   Cardiac Enzymes:  Basename 06/28/11 0427 06/27/11 2322  CKTOTAL 110 102  CKMB 9.0* 7.1*  CKMBINDEX -- --  TROPONINI 1.95* 1.60*  BNP:  [facility] 06/27/11 1843  PROBNP 373.8*   D-Dimer:  Alvira Philips 06/27/11 1908  DDIMER 2.45*   CBG:  Basename 06/28/11 0411 06/28/11 0003 06/27/11 1811  GLUCAP 214* 202* 348*   Hemoglobin A1C: No results found for this basename: HGBA1C in the last 72 hours Fasting Lipid Panel: No results found for this basename: CHOL,HDL,LDLCALC,TRIG,CHOLHDL,LDLDIRECT in the last 72 hours Thyroid Function Tests: No results found for this basename: TSH,T4TOTAL,FREET4,T3FREE,THYROIDAB in the last 72 hours Anemia Panel: No results found for this basename: VITAMINB12,FOLATE,FERRITIN,TIBC,IRON,RETICCTPCT in the last 72 hours Coagulation: No results found  for this basename: LABPROT:2,INR:2 in the last 72 hours Urine Drug Screen: Drugs of Abuse     Component Value Date/Time   LABOPIA NONE DETECTED 11/24/2006 1355   COCAINSCRNUR POSITIVE* 11/24/2006 1355   LABBENZ NONE DETECTED 11/24/2006 1355   AMPHETMU NONE DETECTED 11/24/2006 1355   THCU NONE DETECTED 11/24/2006 1355   LABBARB  Value: NONE DETECTED        DRUG SCREEN FOR MEDICAL PURPOSES ONLY.  IF CONFIRMATION IS NEEDED FOR ANY PURPOSE, NOTIFY LAB WITHIN 5 DAYS. 11/24/2006 1355    Alcohol Level: No results found for this basename: ETH:2 in the last 72 hours Urinalysis:  Basename 06/27/11 1853  COLORURINE YELLOW  LABSPEC 1.015  PHURINE 6.0  GLUCOSEU NEGATIVE  HGBUR SMALL*  BILIRUBINUR NEGATIVE  KETONESUR NEGATIVE  PROTEINUR 100*  UROBILINOGEN 0.2  NITRITE NEGATIVE  LEUKOCYTESUR NEGATIVE   Misc. Labs:   ABGS:  Basename 06/28/11 0420  PHART 7.337*  PO2ART 92.0  TCO2 20.2  HCO3 22.7     MICROBIOLOGY: Recent Results (from the past 240 hour(s))  MRSA PCR SCREENING     Status: Normal   Collection Time   06/27/11 11:42 PM      Component Value Range Status Comment   MRSA by PCR NEGATIVE  NEGATIVE  Final     Studies/Results: Ct Angio Chest W/cm &/or Wo Cm  06/28/2011  *RADIOLOGY REPORT*  Clinical Data: Acute respiratory failure; elevated D-dimer. History of asthma.  CT ANGIOGRAPHY CHEST  Technique:  Multidetector CT imaging of the chest using the standard protocol during bolus administration of intravenous contrast. Multiplanar reconstructed images including MIPs were obtained and reviewed to evaluate the vascular anatomy.  Contrast: OMNIPAQUE IOHEXOL 350 MG/ML IV SOLN  Comparison: Chest radiograph performed 06/27/2011, and CTA of the chest performed 01/08/2010  Findings: There is no evidence of pulmonary embolus.  Mild patchy airspace opacity is noted along the medial aspect of the right upper lobe, and minimally in the right middle lobe in a peribronchial distribution.   There is diffuse bilateral peribronchial soft tissue thickening; this may reflect the patient's history of asthma.  Mild bilateral dependent subsegmental atelectasis is noted.  There is no evidence of pleural effusion or pneumothorax.  No masses are identified; no abnormal focal contrast enhancement is seen.  Diffuse coronary artery calcifications are seen.  The mediastinum is otherwise unremarkable in appearance.  Scattered small periaortic, aortopulmonary window and right paratracheal nodes remain borderline normal in size, without definite evidence for mediastinal lymphadenopathy.  The patient's enteric tube is noted extending to the body of the stomach.  The endotracheal tube is seen ending 2 cm above the carina.  No pericardial effusion is seen.  The great vessels are unremarkable in appearance.  No axillary lymphadenopathy is seen. There is vague asymmetric enlargement of the right thyroid lobe, with scattered calcification; this appears grossly stable from the prior CTA performed 2011, though correlation with prior lab  findings or thyroid ultrasound would be helpful.  The visualized portions of the liver and spleen are unremarkable. The visualized portions of the pancreas, stomach, adrenal glands and kidneys are within normal limits.  No acute osseous abnormalities are seen.  A lucent lesion within the L1 vertebral body was seen to reflect a benign vertebral body hemangioma on the prior MRI of the lumbar spine.  Degenerative change is noted at the upper lumbar spine, with diffuse sclerosis.  IMPRESSION:  1.  No evidence of pulmonary embolus. 2.  Mild patchy airspace opacity at the medial aspect of the right upper lobe, and in a peribronchial distribution in the right middle lobe.  This raises concern for mild pneumonia, possibly atypical in nature. 3.  Diffuse bilateral peribronchial soft tissue thickening; this may reflect the patient's history of asthma, but appears worsened from the prior CTA. 4.  Mild  bilateral dependent subsegmental atelectasis noted. 5.  Diffuse coronary artery calcifications seen. 6.  Vague asymmetric enlargement of the right thyroid lobe, with scattered calcification.  This appears grossly stable from the CTA performed in 2011, but correlation with prior lab findings or thyroid ultrasound would be helpful.  Original Report Authenticated By: Tonia Ghent, M.D.   Dg Chest Port 1 View  06/27/2011  *RADIOLOGY REPORT*  Clinical Data: Intubated.  Smoker.  COPD.  Dyspnea.  PORTABLE CHEST - 1 VIEW  Comparison: 05/26/2011.  Findings: Endotracheal tube in satisfactory position.  Nasogastric tube extending into the stomach.  Stable normal sized heart.  The lungs are clear and mildly hyperexpanded with stable mildly prominent interstitial markings.  Unremarkable bones.  IMPRESSION:  1.  Endotracheal tube in satisfactory position. 2.  COPD.  Original Report Authenticated By: Darrol Angel, M.D.    Medications:  Prior to Admission:  Prescriptions prior to admission  Medication Sig Dispense Refill  . albuterol (PROVENTIL) (2.5 MG/3ML) 0.083% nebulizer solution Take 2.5 mg by nebulization every 6 (six) hours as needed.        . ALPRAZolam (XANAX) 0.5 MG tablet Take 0.5 mg by mouth 4 (four) times daily as needed.       . cyclobenzaprine (FLEXERIL) 10 MG tablet Take 5 mg by mouth 2 (two) times daily.       Marland Kitchen dexlansoprazole (DEXILANT) 60 MG capsule Take 60 mg by mouth daily.      . Fluticasone-Salmeterol (ADVAIR) 250-50 MCG/DOSE AEPB Inhale 1 puff into the lungs every 12 (twelve) hours.        . gabapentin (NEURONTIN) 300 MG capsule Take 300 mg by mouth 2 (two) times daily.        Marland Kitchen OVER THE COUNTER MEDICATION Take 1 capsule by mouth every morning. **Calcium 600 + Vitamin D 1200 Combination Tablet**      . PARoxetine (PAXIL) 30 MG tablet Take 30 mg by mouth every morning.        . polyethylene glycol powder (GLYCOLAX/MIRALAX) powder Take 17 g by mouth daily.      . roflumilast (DALIRESP)  500 MCG TABS tablet Take 500 mcg by mouth daily.      Marland Kitchen tiotropium (SPIRIVA) 18 MCG inhalation capsule Place 18 mcg into inhaler and inhale daily.        . traZODone (DESYREL) 100 MG tablet Take 100 mg by mouth at bedtime.       Marland Kitchen albuterol (PROVENTIL HFA;VENTOLIN HFA) 108 (90 BASE) MCG/ACT inhaler Inhale 1-2 puffs into the lungs every 6 (six) hours as needed for wheezing.  1 Inhaler  0  .  oxyCODONE-acetaminophen (PERCOCET) 5-325 MG per tablet Take 0.5 tablets by mouth every 4 (four) hours as needed. Back spurs      . zolpidem (AMBIEN) 5 MG tablet Take 2 tablets (10 mg total) by mouth at bedtime as needed for sleep.  15 tablet  0   Scheduled:   . albuterol  2.5 mg Nebulization Q4H   And  . ipratropium  0.5 mg Nebulization Q4H  . aspirin  325 mg Per NG tube Daily  . enoxaparin (LOVENOX) injection  65 mg Subcutaneous BID  . etomidate      . famotidine (PEPCID) IV  20 mg Intravenous Q12H  . Fluticasone-Salmeterol  1 puff Inhalation Q12H  . insulin aspart  0-20 Units Subcutaneous Q4H  . insulin glargine  10 Units Subcutaneous QHS  . lidocaine (cardiac) 100 mg/81ml      . methylPREDNISolone sodium succinate  125 mg Intravenous Once  . methylPREDNISolone (SOLU-MEDROL) injection  80 mg Intravenous Q6H  . midazolam  4 mg Intravenous Once  . midazolam  2 mg Intravenous Once  . midazolam  4 mg Intravenous Once  . moxifloxacin  400 mg Intravenous Q24H  . PARoxetine  30 mg Oral Daily  . propofol      . sodium chloride  1,000 mL Intravenous Once  . succinylcholine      . DISCONTD: enoxaparin  40 mg Subcutaneous Q24H  . DISCONTD: lidocaine (cardiac) 50 mg/10ml  100 mg Intravenous Once  . DISCONTD: rocuronium       Continuous:   . dextrose 5 % and 0.45% NaCl 75 mL/hr at 06/28/11 0600  . fentaNYL infusion INTRAVENOUS    . propofol 18 mcg/kg/min (06/28/11 0600)   NWG:[id number], fentaNYL, iohexol, ipratropium  Assesment: She has acute respiratory failure. She had an elevated d-dimer and CT did  not show a pulmonary embolus but does show what looks like a right upper lobe pneumonia. Her lactate level was somewhat elevated on admission but she does not clinically appear to be septic right now. Her heart rate is up however. Her blood gas is much better on current settings. Her pH is much improved. Principal Problem:  *Acute respiratory failure Active Problems:  ANXIETY  DEPRESSION  Other chronic pain  COPD exacerbation    Plan: I'm not sure she'll be able to be weaned today but it is okay to go ahead and see if she can make any progress toward that. Her blood sugar has been up and is being treated. I would repeat lactic acid and pro calcitonin since both were elevated on admission.    LOS: 1 day   Kristin Wells 06/28/2011, 7:35 AM

## 2011-06-28 NOTE — Progress Notes (Signed)
ANTIBIOTIC CONSULT NOTE - INITIAL  Pharmacy Consult for  Vancomycin Indication: rule out pneumonia  No Known Allergies  Patient Measurements: Height: 5\' 2"  (157.5 cm) Weight: 147 lb 11.3 oz (67 kg) IBW/kg (Calculated) : 50.1  Adjusted Body Weight: 67 kg  Vital Signs: Temp: 98.2 F (36.8 C) (02/19 0500) Temp src: Oral (02/19 0500) BP: 115/91 mmHg (02/19 0615) Pulse Rate: 110  (02/19 0615) Intake/Output from previous day: 02/18 0701 - 02/19 0700 In: 797.4 [I.V.:497.4; IV Piggyback:300] Out: 350 [Urine:350] Intake/Output from this shift:    Labs:  [facility] 06/28/11 0427 06/27/11 1908  WBC 11.4* 13.9*  HGB 13.2 13.8  PLT 311 385  LABCREA -- --  CREATININE 0.92 0.96   Estimated Creatinine Clearance: 59.1 ml/min (by C-G formula based on Cr of 0.92).    Microbiology: Recent Results (from the past 720 hour(s))  MRSA PCR SCREENING     Status: Normal   Collection Time   06/27/11 11:42 PM      Component Value Range Status Comment   MRSA by PCR NEGATIVE  NEGATIVE  Final     Medical History: Past Medical History  Diagnosis Date  . COPD (chronic obstructive pulmonary disease)   . Asthma   . Bronchitis   . Anxiety   . Back pain   . Demand ischemia of myocardium 06/28/2011  . Thyroid enlargement 06/28/2011    Medications:  Scheduled:    . albuterol  2.5 mg Nebulization Q4H   And  . ipratropium  0.5 mg Nebulization Q4H  . aspirin  325 mg Per NG tube Daily  . enoxaparin (LOVENOX) injection  65 mg Subcutaneous BID  . etomidate      . famotidine (PEPCID) IV  20 mg Intravenous Q12H  . Fluticasone-Salmeterol  1 puff Inhalation Q12H  . insulin aspart  0-20 Units Subcutaneous Q4H  . insulin glargine  15 Units Subcutaneous BID  . lidocaine (cardiac) 100 mg/56ml      . methylPREDNISolone sodium succinate  125 mg Intravenous Once  . methylPREDNISolone (SOLU-MEDROL) injection  80 mg Intravenous Q6H  . midazolam  4 mg Intravenous Once  . midazolam  2 mg Intravenous Once  .  midazolam  4 mg Intravenous Once  . moxifloxacin  400 mg Intravenous Q24H  . PARoxetine  30 mg Oral Daily  . propofol      . sodium chloride  1,000 mL Intravenous Once  . succinylcholine      . vancomycin  750 mg Intravenous Q12H  . DISCONTD: enoxaparin  40 mg Subcutaneous Q24H  . DISCONTD: insulin glargine  10 Units Subcutaneous QHS  . DISCONTD: lidocaine (cardiac) 50 mg/9ml  100 mg Intravenous Once  . DISCONTD: rocuronium       Assessment: Empiric therapy while on vent.  Goal of Therapy:  Vancomycin trough level 15-20 mcg/ml  Plan:  Vancomycin 750 mg IV every 12 hours. Vancomycin trough on 06-29-11 at 9:00 a.m.  Caryl Asp 06/28/2011,9:04 AM

## 2011-06-28 NOTE — Progress Notes (Signed)
CARE MANAGEMENT NOTE 06/28/2011  Patient:  Kristin Wells, Kristin Wells   Account Number:  [id number]  Date Initiated:  06/28/2011  Documentation initiated by:  Rosemary Holms  Subjective/Objective Assessment:   Pt admitted from home with Acute Respiratory Failure and intubated. Remains on vent.     Action/Plan:   CM to follow as pt progresses. Alisa with [facility] contacted CM regarding detailed history of pt and multiple issue. Documentation in shadow chart.   Anticipated DC Date:  07/04/2011   Anticipated DC Plan:  HOME/SELF CARE  In-house referral  Clinical Social Worker      DC Planning Services  CM consult      Choice offered to / List presented to:             Status of service:  In process, will continue to follow Medicare Important Message given?   (If response is "NO", the following Medicare IM given date fields will be blank) Date Medicare IM given:   Date Additional Medicare IM given:    Discharge Disposition:    Per UR Regulation:    Comments:  06/28/11 1500 Cortlyn Cannell Leanord Hawking RN BSN CM

## 2011-06-28 NOTE — Progress Notes (Signed)
*  PRELIMINARY RESULTS* Echocardiogram 2D Echocardiogram has been performed.  Kristin Wells [place] 06/28/2011, 11:54 AM

## 2011-06-28 NOTE — Consult Note (Signed)
CARDIOLOGY CONSULT NOTE  Patient ID: Kristin Wells MRN: [id number] DOB/AGE: [birth year] 60 y.o.  Admit date: 06/27/2011 Referring Physician: PTA Primary PhysicianAVBUERE,EDWIN A, MD, MD Primary Cardiologist (New) [place] Reason for Consultation: NSTEMI Principal Problem:  *Acute respiratory failure Active Problems:  ANXIETY  DEPRESSION  Other chronic pain  COPD exacerbation  Demand ischemia of myocardium  Hyperglycemia  Thyroid enlargement  HPI: Kristin Wells is a 61 y/o morbidly obese woman with no prior cardiac history or work-up. She has COPD, Bipolar disorder, tobacco abuse, chronic pain with prior admissions for COPD exacerbations. She was admitted with respiratory distress which occurred at home where she was found by friends to be wheezing and cyanotic around her mouth. ABG demonstrated hypercarbic respiratory failure and she was intubated in the ER.  CT scan was negative for PE, possible pneumonia was seen. She had diffuse coronary calcifications. Troponin was found to be positive 1.95. We are asked for cardiac recommendations.  Review of systems complete and found to be negative unless listed above   Past Medical History  Diagnosis Date  . COPD (chronic obstructive pulmonary disease)   . Asthma   . Bronchitis   . Anxiety   . Back pain   . Demand ischemia of myocardium 06/28/2011  . Thyroid enlargement 06/28/2011    Family History  Problem Relation Age of Onset  . Diabetes Mother   . Diabetes Father   . Hypertension Mother   . Hypertension Father     History   Social History  . Marital Status: Single    Spouse Name: N/A    Number of Children: N/A  . Years of Education: N/A   Occupational History  . Not on file.   Social History Main Topics  . Smoking status: Former Smoker    Quit date: 11/14/2010  . Smokeless tobacco: Not on file  . Alcohol Use: No  . Drug Use: No  . Sexually Active:    Other Topics Concern  . Not on file   Social History Narrative    . No narrative on file    Past Surgical History  Procedure Date  . Abdominal hysterectomy      Prescriptions prior to admission  Medication Sig Dispense Refill  . albuterol (PROVENTIL) (2.5 MG/3ML) 0.083% nebulizer solution Take 2.5 mg by nebulization every 6 (six) hours as needed.        . ALPRAZolam (XANAX) 0.5 MG tablet Take 0.5 mg by mouth 4 (four) times daily as needed.       . cyclobenzaprine (FLEXERIL) 10 MG tablet Take 5 mg by mouth 2 (two) times daily.       Marland Kitchen dexlansoprazole (DEXILANT) 60 MG capsule Take 60 mg by mouth daily.      . Fluticasone-Salmeterol (ADVAIR) 250-50 MCG/DOSE AEPB Inhale 1 puff into the lungs every 12 (twelve) hours.        . gabapentin (NEURONTIN) 300 MG capsule Take 300 mg by mouth 2 (two) times daily.        Marland Kitchen OVER THE COUNTER MEDICATION Take 1 capsule by mouth every morning. **Calcium 600 + Vitamin D 1200 Combination Tablet**      . PARoxetine (PAXIL) 30 MG tablet Take 30 mg by mouth every morning.        . polyethylene glycol powder (GLYCOLAX/MIRALAX) powder Take 17 g by mouth daily.      . roflumilast (DALIRESP) 500 MCG TABS tablet Take 500 mcg by mouth daily.      Marland Kitchen tiotropium (SPIRIVA) 18 MCG  inhalation capsule Place 18 mcg into inhaler and inhale daily.        . traZODone (DESYREL) 100 MG tablet Take 100 mg by mouth at bedtime.       Marland Kitchen albuterol (PROVENTIL HFA;VENTOLIN HFA) 108 (90 BASE) MCG/ACT inhaler Inhale 1-2 puffs into the lungs every 6 (six) hours as needed for wheezing.  1 Inhaler  0  . oxyCODONE-acetaminophen (PERCOCET) 5-325 MG per tablet Take 0.5 tablets by mouth every 4 (four) hours as needed. Back spurs      . zolpidem (AMBIEN) 5 MG tablet Take 2 tablets (10 mg total) by mouth at bedtime as needed for sleep.  15 tablet  0    Physical Exam: Blood pressure 115/91, pulse 110, temperature 98.2 F (36.8 C), temperature source Oral, resp. rate 23, height 5\' 2"  (1.575 m), weight 147 lb 11.3 oz (67 kg), SpO2 95.00%.  General: Well  developed, well nourished,intubated and sedated Head: Eyes PERRLA, No xanthomas.   Normal cephalic and atramatic  Lungs:Coarse breath sounds inspiratory and expiratory.  Heart: HRRR S1 S2,tachycardic.  Pulses are 2+ & equal.            Neck obese, unable to assess for carotid bruit. Or JVD.  No abdominal bruits. No femoral bruits. Abdomen: Bowel sounds are positive, abdomen soft and non-tender without masses or                  Hernia's noted. Msk:  Back normal, very obese. Extremities: No clubbing, cyanosis or edema.  DP +1 Neuro: Unresponsive  Psych:  Unresponsive   Lab Results  Component Value Date   WBC 11.4* 06/28/2011   HGB 13.2 06/28/2011   HCT 42.1 06/28/2011   MCV 90.0 06/28/2011   PLT 311 06/28/2011     Lab 06/28/11 0427  NA 141  K 4.7  CL 105  CO2 25  BUN 10  CREATININE 0.92  CALCIUM 8.9  PROT --  BILITOT --  ALKPHOS --  ALT --  AST --  GLUCOSE 238*   Lab Results  Component Value Date   CKTOTAL 110 06/28/2011   CKMB 9.0* 06/28/2011   TROPONINI 1.95* 06/28/2011         Radiology: Ct Angio Chest W/cm &/or Wo Cm  06/28/2011  *RADIOLOGY REPORT*  Clinical Data: Acute respiratory failure; elevated D-dimer. History of asthma.  CT ANGIOGRAPHY CHEST    IMPRESSION:  1.  No evidence of pulmonary embolus. 2.  Mild patchy airspace opacity at the medial aspect of the right upper lobe, and in a peribronchial distribution in the right middle lobe.  This raises concern for mild pneumonia, possibly atypical in nature. 3.  Diffuse bilateral peribronchial soft tissue thickening; this may reflect the patient's history of asthma, but appears worsened from the prior CTA. 4.  Mild bilateral dependent subsegmental atelectasis noted. 5.  Diffuse coronary artery calcifications seen. 6.  Vague asymmetric enlargement of the right thyroid lobe, with scattered calcification.  This appears grossly stable from the CTA performed in 2011, but correlation with prior lab findings or thyroid ultrasound  would be helpful.  Original Report Authenticated By: Tonia Ghent, M.D.   Portable Chest Xray In Am  06/28/2011  *RADIOLOGY REPORT*  Clinical Data: Acute respiratory distress, on ventilator  PORTABLE CHEST - 1 VIEW    IMPRESSION:  1.  Tip of endotracheal tube is obscured, probably approximately 1.5 cm above the carina. 2. Prominent markings at the left lung base suspicious for pneumonia.  Original Report Authenticated By:  Juline Patch, M.D.   Dg Chest Port 1 View  06/27/2011  *RADIOLOGY REPORT*  Clinical Data: Intubated.  Smoker.  COPD.  Dyspnea.  PORTABLE CHEST - 1 VIEW  IMPRESSION:  1.  Endotracheal tube in satisfactory position. 2.  COPD.  Original Report Authenticated By: Darrol Angel, M.D.   EKG: Sinus tachycardia; borderline low voltage, LPFB, delayed R wave progression.  Echo:  Apical akinesis to mild dyskinesis; moderate to severe mid/distal anteroseptal hypokinesis; akinesis of the mid/distal inferior/posterior segment.  EF 25-30%.  No significant valvular abnormalities.  ASSESSMENT AND PLAN:  1.NSTEMI: Type II, most likely in the setting of hypercarbic respiratory distress. She is currently intubated and sedated. EKG->no evidence for acute ischemia. CT scan did show diffuse coronary artery calcifications. Echocardiogram suggests and ischemic cardiomyopathy with a large segmental wall motion abnormality and moderately to severely depressed LV function.  Receiving full anticoagulation with heparin 65 mg twice a day and an aspirin 325 mg per tube. Blood pressure was elevated initially on admission, but now normotensive. Heart rate is tachycardic.   2. Ventilator Dependent Respiratory Failure: Management per Dr. Juanetta Gosling and CCM.  Bettey Mare. Lyman Bishop NP [facility] 06/28/2011, 8:43 AM   Cardiology Attending Patient interviewed and examined. Discussed with Joni Reining, NP.  Above note annotated and modified based upon my findings.  Pt presents with apparent pneumonia and  respiratory failure.  Modest increase in troponin with normal CPK total indicates minimal if any myocardial necrosis.  Doubt ACS->change to prophylactic dose enoxaparin.  Echo indicates ischemic cardiomyopathy of uncertain duration.  Until respiratory status improves and possible management strategies can be discussed with patient, we will treat cardiac disease with appropriate medications.  Use IV furosemide as needed to maintain I&O slightly negative.  Add enalaprilat IV-change to oral ACE-I once she is extubated and taking PO.  Loris Bing, MD 06/28/2011, 7:33 PM

## 2011-06-29 ENCOUNTER — Encounter (HOSPITAL_COMMUNITY): Payer: Self-pay | Admitting: Internal Medicine

## 2011-06-29 ENCOUNTER — Inpatient Hospital Stay (HOSPITAL_COMMUNITY): Payer: PRIVATE HEALTH INSURANCE

## 2011-06-29 DIAGNOSIS — J189 Pneumonia, unspecified organism: Secondary | ICD-10-CM | POA: Diagnosis present

## 2011-06-29 DIAGNOSIS — I959 Hypotension, unspecified: Secondary | ICD-10-CM | POA: Diagnosis not present

## 2011-06-29 DIAGNOSIS — I2589 Other forms of chronic ischemic heart disease: Secondary | ICD-10-CM

## 2011-06-29 DIAGNOSIS — I214 Non-ST elevation (NSTEMI) myocardial infarction: Secondary | ICD-10-CM | POA: Diagnosis present

## 2011-06-29 DIAGNOSIS — J441 Chronic obstructive pulmonary disease with (acute) exacerbation: Secondary | ICD-10-CM

## 2011-06-29 DIAGNOSIS — I219 Acute myocardial infarction, unspecified: Secondary | ICD-10-CM

## 2011-06-29 DIAGNOSIS — J9600 Acute respiratory failure, unspecified whether with hypoxia or hypercapnia: Secondary | ICD-10-CM

## 2011-06-29 HISTORY — DX: Non-ST elevation (NSTEMI) myocardial infarction: I21.4

## 2011-06-29 HISTORY — DX: Pneumonia, unspecified organism: J18.9

## 2011-06-29 LAB — CBC
HCT: 37.1 % (ref 36.0–46.0)
Hemoglobin: 12 g/dL (ref 12.0–15.0)
MCH: 28.8 pg (ref 26.0–34.0)
MCHC: 32.3 g/dL (ref 30.0–36.0)
MCV: 89.2 fL (ref 78.0–100.0)
Platelets: 256 10*3/uL (ref 150–400)
RBC: 4.16 MIL/uL (ref 3.87–5.11)
RDW: 15.2 % (ref 11.5–15.5)
WBC: 12.8 10*3/uL — ABNORMAL HIGH (ref 4.0–10.5)

## 2011-06-29 LAB — BLOOD GAS, ARTERIAL
Acid-Base Excess: 2.1 mmol/L — ABNORMAL HIGH (ref 0.0–2.0)
Acid-Base Excess: 1.3 mmol/L (ref 0.0–2.0)
Bicarbonate: 26 mEq/L — ABNORMAL HIGH (ref 20.0–24.0)
Bicarbonate: 25.8 mEq/L — ABNORMAL HIGH (ref 20.0–24.0)
Drawn by: 21694
FIO2: 40 %
FIO2: 40 %
MECHVT: 400 mL
Mode: POSITIVE
O2 Content: 40 L/min
O2 Saturation: 97.8 %
O2 Saturation: 98.9 %
PEEP: 5 cmH2O
PEEP: 5 cmH2O
Patient temperature: 37
Patient temperature: 37
Pressure support: 5 cmH2O
RATE: 16 resp/min
TCO2: 23.1 mmol/L (ref 0–100)
TCO2: 23.1 mmol/L (ref 0–100)
pCO2 arterial: 39.2 mmHg (ref 35.0–45.0)
pCO2 arterial: 44 mmHg (ref 35.0–45.0)
pH, Arterial: 7.436 — ABNORMAL HIGH (ref 7.350–7.400)
pH, Arterial: 7.386 (ref 7.350–7.400)
pO2, Arterial: 95.8 mmHg (ref 80.0–100.0)
pO2, Arterial: 133 mmHg — ABNORMAL HIGH (ref 80.0–100.0)

## 2011-06-29 LAB — GLUCOSE, CAPILLARY
Glucose-Capillary: 191 mg/dL — ABNORMAL HIGH (ref 70–99)
Glucose-Capillary: 126 mg/dL — ABNORMAL HIGH (ref 70–99)
Glucose-Capillary: 131 mg/dL — ABNORMAL HIGH (ref 70–99)
Glucose-Capillary: 133 mg/dL — ABNORMAL HIGH (ref 70–99)
Glucose-Capillary: 129 mg/dL — ABNORMAL HIGH (ref 70–99)
Glucose-Capillary: 245 mg/dL — ABNORMAL HIGH (ref 70–99)
Glucose-Capillary: 157 mg/dL — ABNORMAL HIGH (ref 70–99)

## 2011-06-29 LAB — COMPREHENSIVE METABOLIC PANEL
ALT: 21 U/L (ref 0–35)
AST: 16 U/L (ref 0–37)
Albumin: 2.8 g/dL — ABNORMAL LOW (ref 3.5–5.2)
Alkaline Phosphatase: 105 U/L (ref 39–117)
BUN: 11 mg/dL (ref 6–23)
CO2: 26 mEq/L (ref 19–32)
Calcium: 9.3 mg/dL (ref 8.4–10.5)
Chloride: 107 mEq/L (ref 96–112)
Creatinine, Ser: 0.94 mg/dL (ref 0.50–1.10)
GFR calc Af Amer: 75 mL/min — ABNORMAL LOW (ref >90)
GFR calc non Af Amer: 65 mL/min — ABNORMAL LOW (ref >90)
Glucose, Bld: 128 mg/dL — ABNORMAL HIGH (ref 70–99)
Potassium: 3.6 mEq/L (ref 3.5–5.1)
Sodium: 143 mEq/L (ref 135–145)
Total Bilirubin: 0.2 mg/dL — ABNORMAL LOW (ref 0.3–1.2)
Total Protein: 6.4 g/dL (ref 6.0–8.3)

## 2011-06-29 LAB — URINE CULTURE
Colony Count: NO GROWTH
Culture  Setup Time: 201302190714
Culture: NO GROWTH

## 2011-06-29 LAB — VANCOMYCIN, TROUGH: Vancomycin Tr: 11.3 ug/mL (ref 10.0–20.0)

## 2011-06-29 MED ORDER — IPRATROPIUM BROMIDE 0.02 % IN SOLN
0.5000 mg | Freq: Four times a day (QID) | RESPIRATORY_TRACT | Status: DC
  Administered 2011-06-29 – 2011-07-05 (×23): 0.5 mg via RESPIRATORY_TRACT
  Filled 2011-06-29 (×22): qty 2.5

## 2011-06-29 MED ORDER — SODIUM CHLORIDE 0.9 % IJ SOLN
INTRAMUSCULAR | Status: AC
  Administered 2011-06-29: 3 mL
  Filled 2011-06-29: qty 3

## 2011-06-29 MED ORDER — MIDAZOLAM HCL 2 MG/2ML IJ SOLN
2.0000 mg | INTRAMUSCULAR | Status: DC | PRN
Start: 2011-06-29 — End: 2011-06-29

## 2011-06-29 MED ORDER — METHYLPREDNISOLONE SODIUM SUCC 125 MG IJ SOLR
60.0000 mg | Freq: Two times a day (BID) | INTRAMUSCULAR | Status: DC
  Administered 2011-06-29 – 2011-06-30 (×2): 60 mg via INTRAVENOUS
  Filled 2011-06-29 (×2): qty 2

## 2011-06-29 MED ORDER — VANCOMYCIN HCL IN DEXTROSE 1-5 GM/200ML-% IV SOLN: 1000.0000 mg | Freq: Two times a day (BID) | INTRAVENOUS | Status: DC

## 2011-06-29 MED ORDER — INSULIN ASPART 100 UNIT/ML ~~LOC~~ SOLN
0.0000 [IU] | Freq: Every day | SUBCUTANEOUS | Status: DC
  Administered 2011-06-29: 0 [IU] via SUBCUTANEOUS

## 2011-06-29 MED ORDER — MIDAZOLAM HCL 2 MG/2ML IJ SOLN
2.0000 mg | Freq: Once | INTRAMUSCULAR | Status: AC
  Administered 2011-06-29: 2 mg via INTRAVENOUS
  Filled 2011-06-29: qty 2

## 2011-06-29 MED ORDER — ALBUTEROL SULFATE (5 MG/ML) 0.5% IN NEBU
2.5000 mg | INHALATION_SOLUTION | Freq: Four times a day (QID) | RESPIRATORY_TRACT | Status: DC
  Administered 2011-06-29 – 2011-07-05 (×23): 2.5 mg via RESPIRATORY_TRACT
  Filled 2011-06-29 (×23): qty 0.5

## 2011-06-29 MED ORDER — VANCOMYCIN HCL IN DEXTROSE 1-5 GM/200ML-% IV SOLN
1000.0000 mg | Freq: Two times a day (BID) | INTRAVENOUS | Status: DC
  Administered 2011-06-29 – 2011-07-01 (×5): 1000 mg via INTRAVENOUS
  Filled 2011-06-29 (×11): qty 200

## 2011-06-29 MED ORDER — INSULIN ASPART 100 UNIT/ML ~~LOC~~ SOLN
0.0000 [IU] | Freq: Three times a day (TID) | SUBCUTANEOUS | Status: DC
  Administered 2011-06-30: 3 [IU] via SUBCUTANEOUS
  Administered 2011-06-30: 4 [IU] via SUBCUTANEOUS
  Administered 2011-07-01: 3 [IU] via SUBCUTANEOUS
  Administered 2011-07-01: 11 [IU] via SUBCUTANEOUS
  Administered 2011-07-02 (×2): 4 [IU] via SUBCUTANEOUS
  Administered 2011-07-03 (×2): 3 [IU] via SUBCUTANEOUS

## 2011-06-29 MED ORDER — FENTANYL CITRATE 0.05 MG/ML IJ SOLN
50.0000 ug | INTRAMUSCULAR | Status: DC | PRN
  Filled 2011-06-29: qty 2

## 2011-06-29 MED ORDER — ENALAPRIL MALEATE 5 MG PO TABS
2.5000 mg | ORAL_TABLET | Freq: Every day | ORAL | Status: DC
  Administered 2011-06-30 – 2011-07-03 (×4): 2.5 mg via ORAL
  Filled 2011-06-29 (×4): qty 1

## 2011-06-29 NOTE — Progress Notes (Signed)
[facility] Physician-Brief Progress Note Patient Name: Kristin Wells DOB: 1951-01-29 MRN: [id number]  Date of Service  06/29/2011   HPI/Events of Note  Patient intubated on sedation with propofol.  Now with hypotension with MAP in the low 60s.  Call to nurse regarding hypotension.  She is not sure of goal MAP/BP.  Nurse is concerned if she reduces sedation for hypotension that the patient will pull her ETT.  eICU Interventions  Plan: Bilateral wrist restraints to allow backing off on sedation due to hypotension   Intervention Category Intermediate Interventions: Hypotension - evaluation and management  Shayna Eblen 06/29/2011, 1:50 AM

## 2011-06-29 NOTE — Progress Notes (Signed)
*  PRELIMINARY RESULTS* Echocardiogram 2D Echocardiogram has been performed.  [place]rad  [place] 06/29/2011, 3:01 PM

## 2011-06-29 NOTE — Progress Notes (Signed)
Subjective: Patient awake on the ventilator. Does not indicate any active or recent chest pain.   Objective: Temp:  [99 F (37.2 C)-100.1 F (37.8 C)] 99 F (37.2 C) (02/20 0800) Pulse Rate:  [97-125] 110  (02/20 0800) Resp:  [12-31] 16  (02/20 0800) BP: (72-130)/(43-106) 115/77 mmHg (02/20 0800) SpO2:  [94 %-100 %] 98 % (02/20 0800) FiO2 (%):  [38.6 %-45 %] 40.9 % (02/20 0700)  I/O last 3 completed shifts: In: 3500.4 [I.V.:2550.4; IV Piggyback:950] Out: 2500 [Urine:2500]  Telemetry - Sinus rhythm, sinus tachycardia.  Exam -   General - Chronically ill-appearing, NAD.  Lungs - Coarse but clear breath sounds, no wheeze.  Cardiac - Tachycardic regular rhythm, no S3.  Abdomen - NABS.  Extremities - No pitting.  Testing -   Lab Results  Component Value Date   WBC 12.8* 06/29/2011   HGB 12.0 06/29/2011   HCT 37.1 06/29/2011   MCV 89.2 06/29/2011   PLT 256 06/29/2011    Lab Results  Component Value Date   CREATININE 0.94 06/29/2011   BUN 11 06/29/2011   NA 143 06/29/2011   K 3.6 06/29/2011   CL 107 06/29/2011   CO2 26 06/29/2011    Lab Results  Component Value Date   CKTOTAL 112 06/28/2011   CKMB 8.5* 06/28/2011   TROPONINI 1.69* 06/28/2011    Current Medications    . albuterol  2.5 mg Nebulization Q4H   And  . ipratropium  0.5 mg Nebulization Q4H  . antiseptic oral rinse  15 mL Mouth Rinse QID  . aspirin  325 mg Per NG tube Daily  . chlorhexidine  15 mL Mouth Rinse BID  . enalaprilat  1.25 mg Intravenous Q6H  . enoxaparin (LOVENOX) injection  40 mg Subcutaneous Q24H  . famotidine (PEPCID) IV  20 mg Intravenous Q12H  . Fluticasone-Salmeterol  1 puff Inhalation Q12H  . furosemide  20 mg Intravenous Once  . influenza  inactive virus vaccine  0.5 mL Intramuscular Tomorrow-1000  . insulin aspart  0-20 Units Subcutaneous Q4H  . insulin glargine  15 Units Subcutaneous BID  . methylPREDNISolone (SOLU-MEDROL) injection  60 mg Intravenous Q12H  . midazolam  2-4  mg Intravenous Once  . moxifloxacin  400 mg Intravenous Q24H  . PARoxetine  30 mg Oral Daily  . pneumococcal 23 valent vaccine  0.5 mL Intramuscular Tomorrow-1000  . propofol      . vancomycin  750 mg Intravenous Q12H  . DISCONTD: enoxaparin (LOVENOX) injection  65 mg Subcutaneous BID  . DISCONTD: insulin glargine  10 Units Subcutaneous QHS  . DISCONTD: methylPREDNISolone (SOLU-MEDROL) injection  80 mg Intravenous Q6H    Assessment:  1. NSTEMI, likely type II in the setting of comorbidities and respiratory failure. No active chest pain, and recent ECG shows nonspecific ST changes. Peak troponin I 1.9.  2. Probable ischemic cardiomyopathy, LVEF of 25-30% with anteroseptal and apical wall motion abnormalities.  3. Coronary artery disease based on above and also findings of diffuse coronary calcifications by chest CT.  4. Ventilatory dependent respiratory failure in the setting of COPD exacerbation and pneumonia. Patient is undergoing weaning protocol, followed by Dr. Juanetta Gosling.  5. Recent hypotension, probably multifactorial in the setting of sedatives and new medications. Blood pressure stable this morning.  Plan:  Patient working toward extubation with weaning protocol as per Dr. Juanetta Gosling. She is awake this morning. Present cardiac medications include aspirin, Vasotec, Lovenox, and Lasix. No beta blocker at this time in light of respiratory  failure with COPD exacerbation. Followup ECG for tomorrow morning. At this point anticipate medical therapy as other comorbidities stabilize.  Jonelle Sidle, M.D., F.A.C.C.

## 2011-06-29 NOTE — Progress Notes (Signed)
Utilization review completed.  

## 2011-06-29 NOTE — Procedures (Signed)
Extubation Procedure Note  Patient Details:   Name: Kristin Wells DOB: [birth year] MRN: [id number]   Airway Documentation:  Airway 7 mm (Active)  Secured at (cm) 22 cm 06/29/2011  8:01 AM  Measured From Lips 06/29/2011  8:01 AM  Secured Location Left 06/29/2011  8:01 AM  Secured By [name] 06/29/2011  8:01 AM  Tube Holder Repositioned Yes 06/29/2011  8:01 AM  Cuff Pressure (cm H2O) 22 cm H2O 06/29/2011  8:01 AM  Site Condition Dry 06/29/2011  8:01 AM    Evaluation  O2 sats: stable throughout Complications: No apparent complications Patient did tolerate procedure well. Bilateral Breath Sounds: Diminished Suctioning: Airway Yes  Katheren Shams 06/29/2011, 10:17 AM

## 2011-06-29 NOTE — Progress Notes (Signed)
Subjective: Events noted overnight for hypotension when her blood pressure fell to the 70s. She is currently ventilated and coughing as the weaning is being tried.     And has a cardiac history Objective: Vital signs in last 24 hours: Filed Vitals:   06/29/11 0615 06/29/11 0630 06/29/11 0645 06/29/11 0700  BP: 84/56 88/50 89/54  91/55  Pulse: 108 106 105 104  Temp:      TempSrc:      Resp: 16  16 16   Height:      Weight:      SpO2: 97% 97% 97% 97%    Intake/Output Summary (Last 24 hours) at 06/29/11 0801 Last data filed at 06/29/11 0600  Gross per 24 hour  Intake 2539.42 ml  Output   2150 ml  Net 389.42 ml    Weight change:   Physical exam: General: semi-alert 61 year old Caucasian woman who is mechanically ventilated.  Lungs: few upper lobe bilateral rhonchus wheezing.  Heart: S1, S2, with mild tachycardia. Abdomen: Positive bowel sounds, soft, nontender, nondistended. Extremities: Pedal pulses palpable. No pedal edema. Neurologic: She is semi-- alert. She is coughing. She is moving all extremities. She gives eye contact when her name is called.    Lab Results: Basic Metabolic Panel:  Basename 06/29/11 0425 06/28/11 0427  NA 143 141  K 3.6 4.7  CL 107 105  CO2 26 25  GLUCOSE 128* 238*  BUN 11 10  CREATININE 0.94 0.92  CALCIUM 9.3 8.9  MG -- 2.2  PHOS -- --   Liver Function Tests:  Basename 06/29/11 0425  AST 16  ALT 21  ALKPHOS 105  BILITOT 0.2*  PROT 6.4  ALBUMIN 2.8*   No results found for this basename: LIPASE:2,AMYLASE:2 in the last 72 hours No results found for this basename: AMMONIA:2 in the last 72 hours CBC:  Basename 06/29/11 0425 06/28/11 0427 06/27/11 1908  WBC 12.8* 11.4* --  NEUTROABS -- -- 7.9*  HGB 12.0 13.2 --  HCT 37.1 42.1 --  MCV 89.2 90.0 --  PLT 256 311 --   Cardiac Enzymes:  Basename 06/28/11 1158 06/28/11 0427 06/27/11 2322  CKTOTAL 112 110 102  CKMB 8.5* 9.0* 7.1*  CKMBINDEX -- -- --  TROPONINI 1.69* 1.95* 1.60*    BNP:  Basename 06/27/11 1843  PROBNP 373.8*   D-Dimer:  Basename 06/27/11 1908  DDIMER 2.45*   CBG:  Basename 06/29/11 0403 06/29/11 0101 06/28/11 2040 06/28/11 1624 06/28/11 1159 06/28/11 1157  GLUCAP 126* 191* 170* 133* 186* 197*   Hemoglobin A1C:  Basename 06/27/11 2322  HGBA1C 5.9*   Fasting Lipid Panel: No results found for this basename: CHOL,HDL,LDLCALC,TRIG,CHOLHDL,LDLDIRECT in the last 72 hours Thyroid Function Tests:  Basename 06/28/11 0842  TSH 1.325  T4TOTAL --  FREET4 1.00  T3FREE --  THYROIDAB --   Anemia Panel: No results found for this basename: VITAMINB12,FOLATE,FERRITIN,TIBC,IRON,RETICCTPCT in the last 72 hours Coagulation: No results found for this basename: LABPROT:2,INR:2 in the last 72 hours Urine Drug Screen: Drugs of Abuse     Component Value Date/Time   LABOPIA NONE DETECTED 11/24/2006 1355   COCAINSCRNUR POSITIVE* 11/24/2006 1355   LABBENZ NONE DETECTED 11/24/2006 1355   AMPHETMU NONE DETECTED 11/24/2006 1355   THCU NONE DETECTED 11/24/2006 1355   LABBARB  Value: NONE DETECTED        DRUG SCREEN FOR MEDICAL PURPOSES ONLY.  IF CONFIRMATION IS NEEDED FOR ANY PURPOSE, NOTIFY LAB WITHIN 5 DAYS. 11/24/2006 1355    Alcohol Level: No results found  for this basename: ETH:2 in the last 72 hours Urinalysis:  Basename 06/27/11 1853  COLORURINE YELLOW  LABSPEC 1.015  PHURINE 6.0  GLUCOSEU NEGATIVE  HGBUR SMALL*  BILIRUBINUR NEGATIVE  KETONESUR NEGATIVE  PROTEINUR 100*  UROBILINOGEN 0.2  NITRITE NEGATIVE  LEUKOCYTESUR NEGATIVE   Misc. Labs:   Micro: Recent Results (from the past 240 hour(s))  CULTURE, BLOOD (ROUTINE X 2)     Status: Normal (Preliminary result)   Collection Time   06/27/11  6:20 PM      Component Value Range Status Comment   Specimen Description BLOOD RIGHT HAND   Final    Special Requests BOTTLES DRAWN AEROBIC AND ANAEROBIC 6CC   Final    Culture NO GROWTH 1 DAY   Final    Report Status PENDING   Incomplete     CULTURE, BLOOD (ROUTINE X 2)     Status: Normal (Preliminary result)   Collection Time   06/27/11  6:40 PM      Component Value Range Status Comment   Specimen Description BLOOD RIGHT ANTECUBITAL   Final    Special Requests BOTTLES DRAWN AEROBIC AND ANAEROBIC 8CC   Final    Culture NO GROWTH 1 DAY   Final    Report Status PENDING   Incomplete   URINE CULTURE     Status: Normal   Collection Time   06/27/11  6:53 PM      Component Value Range Status Comment   Specimen Description URINE, CATHETERIZED   Final    Special Requests NONE   Final    Culture  Setup Time [id number]   Final    Colony Count NO GROWTH   Final    Culture NO GROWTH   Final    Report Status 06/29/2011 FINAL   Final   MRSA PCR SCREENING     Status: Normal   Collection Time   06/27/11 11:42 PM      Component Value Range Status Comment   MRSA by PCR NEGATIVE  NEGATIVE  Final     Studies/Results: Ct Angio Chest W/cm &/or Wo Cm  06/28/2011  *RADIOLOGY REPORT*  Clinical Data: Acute respiratory failure; elevated D-dimer. History of asthma.  CT ANGIOGRAPHY CHEST  Technique:  Multidetector CT imaging of the chest using the standard protocol during bolus administration of intravenous contrast. Multiplanar reconstructed images including MIPs were obtained and reviewed to evaluate the vascular anatomy.  Contrast: OMNIPAQUE IOHEXOL 350 MG/ML IV SOLN  Comparison: Chest radiograph performed 06/27/2011, and CTA of the chest performed 01/08/2010  Findings: There is no evidence of pulmonary embolus.  Mild patchy airspace opacity is noted along the medial aspect of the right upper lobe, and minimally in the right middle lobe in a peribronchial distribution.  There is diffuse bilateral peribronchial soft tissue thickening; this may reflect the patient's history of asthma.  Mild bilateral dependent subsegmental atelectasis is noted.  There is no evidence of pleural effusion or pneumothorax.  No masses are identified; no abnormal focal  contrast enhancement is seen.  Diffuse coronary artery calcifications are seen.  The mediastinum is otherwise unremarkable in appearance.  Scattered small periaortic, aortopulmonary window and right paratracheal nodes remain borderline normal in size, without definite evidence for mediastinal lymphadenopathy.  The patient's enteric tube is noted extending to the body of the stomach.  The endotracheal tube is seen ending 2 cm above the carina.  No pericardial effusion is seen.  The great vessels are unremarkable in appearance.  No axillary lymphadenopathy is  seen. There is vague asymmetric enlargement of the right thyroid lobe, with scattered calcification; this appears grossly stable from the prior CTA performed 2011, though correlation with prior lab findings or thyroid ultrasound would be helpful.  The visualized portions of the liver and spleen are unremarkable. The visualized portions of the pancreas, stomach, adrenal glands and kidneys are within normal limits.  No acute osseous abnormalities are seen.  A lucent lesion within the L1 vertebral body was seen to reflect a benign vertebral body hemangioma on the prior MRI of the lumbar spine.  Degenerative change is noted at the upper lumbar spine, with diffuse sclerosis.  IMPRESSION:  1.  No evidence of pulmonary embolus. 2.  Mild patchy airspace opacity at the medial aspect of the right upper lobe, and in a peribronchial distribution in the right middle lobe.  This raises concern for mild pneumonia, possibly atypical in nature. 3.  Diffuse bilateral peribronchial soft tissue thickening; this may reflect the patient's history of asthma, but appears worsened from the prior CTA. 4.  Mild bilateral dependent subsegmental atelectasis noted. 5.  Diffuse coronary artery calcifications seen. 6.  Vague asymmetric enlargement of the right thyroid lobe, with scattered calcification.  This appears grossly stable from the CTA performed in 2011, but correlation with prior lab  findings or thyroid ultrasound would be helpful.  Original Report Authenticated By: Tonia Ghent, M.D.   Portable Chest Xray In Am  06/28/2011  *RADIOLOGY REPORT*  Clinical Data: Acute respiratory distress, on ventilator  PORTABLE CHEST - 1 VIEW  Comparison: Portable chest x-ray of 06/27/2011  Findings: The tip of the endotracheal tube is obscured by overlying electrode leads.  The tip appears to be approximately 1.5 cm above the carina.  Prominent markings are noted particularly at the left lung base and pneumonia cannot be excluded.  No effusion is seen. Heart size is stable.  IMPRESSION:  1.  Tip of endotracheal tube is obscured, probably approximately 1.5 cm above the carina. 2. Prominent markings at the left lung base suspicious for pneumonia.  Original Report Authenticated By: Juline Patch, M.D.   Dg Chest Port 1 View  06/27/2011  *RADIOLOGY REPORT*  Clinical Data: Intubated.  Smoker.  COPD.  Dyspnea.  PORTABLE CHEST - 1 VIEW  Comparison: 05/26/2011.  Findings: Endotracheal tube in satisfactory position.  Nasogastric tube extending into the stomach.  Stable normal sized heart.  The lungs are clear and mildly hyperexpanded with stable mildly prominent interstitial markings.  Unremarkable bones.  IMPRESSION:  1.  Endotracheal tube in satisfactory position. 2.  COPD.  Original Report Authenticated By: Darrol Angel, M.D.     Echo:  Study Conclusions  - Left ventricle: The cavity size was normal. Mild to moderate LVH with disproportionate septal hypertrophy. Systolic function was severely reduced. The estimated ejection fraction was in the range of 25% to 30%. Moderate to severe hypokinesis of the distal anteroseptal myocardium. Apical akinesis to mild dyskinesis. Akinesis of the mid-distalinferior myocardium. - Aortic valve: Mildly calcified annulus. - Right ventricle: The cavity size was mildly decreased. Wall thickness was mildly increased. - Atrial septum: No defect or patent foramen  ovale was identified. - Pericardium, extracardiac: A small pericardial effusion was identified along the right ventricular free wall. The fluid exhibited a fibrinous appearance.There was no evidence of hemodynamic compromise. Transthoracic echocardiography. M-mode, complete 2D, spectral Doppler, and color Doppler. Height: Height: 157.5cm. Height: 62in. Weight: Weight: 66.7kg. Weight: 146.7lb. Body mass index: BMI: 26.9kg/m^2. Body surface area: BSA: 1.65m^2. Patient status:  Inpatient. Location: ICU/CCU    Medications: I have reviewed the patient's current medications.  Assessment: Principal Problem:  *Acute respiratory failure Active Problems:  ANXIETY  DEPRESSION  Other chronic pain  COPD exacerbation  Demand ischemia of myocardium  Hyperglycemia  Thyroid enlargement  Ischemic cardiomyopathy  MI, acute, non ST segment elevation  PNA (pneumonia)  Hypotension    1. Acute ventilator dependent respiratory failure, presumed to be secondary to COPD exacerbation, but contribution from pneumonia and newly diagnosed ischemic cardiomyopathy are probable contributors. Overall, her ventilation and oxygenation have improved significantly. Respiratory acidosis has resolved. She is on Avelox, vancomycin, and Solu-Medrol. She is on bronchodilator therapy. Dr. Juanetta Gosling is assisting with ventilator management. Hopefully, the patient can be weaned and extubated within the next 24-48 hours.  Pneumonia. She is on Avelox and vancomycin.  Acute non-ST elevation MI with ischemic cardiomyopathy. The 2-D echocardiogram results are consistent with a previous MI and ischemic cardiomyopathy with an ejection fraction of 25-30%. [facility]ology has seen the patient and made recommendations. Full dose Lovenox has been discontinued by Dr. Dietrich Pates. IV Vasotec has been started. She was given one dose of IV Lasix yesterday. She is on aspirin.    Thyroid enlargement.  TSH and free T4 are within normal  limits.  Hypotension. Likely secondary to IV Vasotec and Lasix given yesterday evening. Her blood pressures are holding in the 90s systolically this morning.   Hyperglycemia. this is secondary to steroids and dextrose in the IV fluids.  Hemoglobin A1c is 5.9.   Chronic anxiety and depression. She is treated  chronically with alprazolam, Paxil, and trazodone.  Chronic pain syndrome. Opiate dependent.  Plan:  1. Decrease steroids and wean further appropriately. 2. Weaning from ventilator per respiratory therapy and Dr. Juanetta Gosling.    Total critical care time 40 minutes.      LOS: 2 days   Kristin Wells 06/29/2011, 8:01 AM

## 2011-06-29 NOTE — Progress Notes (Signed)
ANTIBIOTIC CONSULT NOTE -   Pharmacy Consult for  Vancomycin Indication: rule out pneumonia  No Known Allergies  Patient Measurements: Height: 5\' 2"  (157.5 cm) Weight: 147 lb 11.3 oz (67 kg) IBW/kg (Calculated) : 50.1  Adjusted Body Weight: 67 kg  Vital Signs: Temp: 99 F (37.2 C) (02/20 0800) Temp src: Oral (02/20 0800) BP: 115/77 mmHg (02/20 0800) Pulse Rate: 110  (02/20 0800) Intake/Output from previous day: 02/19 0701 - 02/20 0700 In: 2621.2 [I.V.:1971.2; IV Piggyback:650] Out: 2150 [Urine:2150] Intake/Output from this shift: Total I/O In: 16.9 [I.V.:16.9] Out: -   Labs:  RECENT VANCOMYCIN TROUGH LEVELS: Recent Labs  Basename 06/29/11 0908   VANCOTROUGH 11.3    Basename 06/29/11 0425 06/28/11 0427 06/27/11 1908  WBC 12.8* 11.4* 13.9*  HGB 12.0 13.2 13.8  PLT 256 311 385  LABCREA -- -- --  CREATININE 0.94 0.92 0.96   Estimated Creatinine Clearance: 57.9 ml/min (by C-G formula based on Cr of 0.94).    Microbiology: Recent Results (from the past 720 hour(s))  CULTURE, BLOOD (ROUTINE X 2)     Status: Normal (Preliminary result)   Collection Time   06/27/11  6:20 PM      Component Value Range Status Comment   Specimen Description BLOOD RIGHT HAND   Final    Special Requests BOTTLES DRAWN AEROBIC AND ANAEROBIC 6CC   Final    Culture NO GROWTH 2 DAYS   Final    Report Status PENDING   Incomplete   CULTURE, BLOOD (ROUTINE X 2)     Status: Normal (Preliminary result)   Collection Time   06/27/11  6:40 PM      Component Value Range Status Comment   Specimen Description BLOOD RIGHT ANTECUBITAL   Final    Special Requests BOTTLES DRAWN AEROBIC AND ANAEROBIC 8CC   Final    Culture NO GROWTH 2 DAYS   Final    Report Status PENDING   Incomplete   URINE CULTURE     Status: Normal   Collection Time   06/27/11  6:53 PM      Component Value Range Status Comment   Specimen Description URINE, CATHETERIZED   Final    Special Requests NONE   Final    Culture  Setup  Time [id number]   Final    Colony Count NO GROWTH   Final    Culture NO GROWTH   Final    Report Status 06/29/2011 FINAL   Final   MRSA PCR SCREENING     Status: Normal   Collection Time   06/27/11 11:42 PM      Component Value Range Status Comment   MRSA by PCR NEGATIVE  NEGATIVE  Final     Medical History: Past Medical History  Diagnosis Date  . COPD (chronic obstructive pulmonary disease)   . Asthma   . Bronchitis   . Anxiety   . Back pain   . Demand ischemia of myocardium 06/28/2011  . Thyroid enlargement 06/28/2011  . MI, acute, non ST segment elevation 06/29/2011  . Ischemic cardiomyopathy 06/28/2011    EF 25%  . PNA (pneumonia) 06/29/2011    Medications:  Scheduled:     . albuterol  2.5 mg Nebulization Q4H   And  . ipratropium  0.5 mg Nebulization Q4H  . antiseptic oral rinse  15 mL Mouth Rinse QID  . aspirin  325 mg Per NG tube Daily  . chlorhexidine  15 mL Mouth Rinse BID  . enalaprilat  1.25 mg  Intravenous Q6H  . enoxaparin (LOVENOX) injection  40 mg Subcutaneous Q24H  . famotidine (PEPCID) IV  20 mg Intravenous Q12H  . Fluticasone-Salmeterol  1 puff Inhalation Q12H  . furosemide  20 mg Intravenous Once  . influenza  inactive virus vaccine  0.5 mL Intramuscular Tomorrow-1000  . insulin aspart  0-20 Units Subcutaneous Q4H  . insulin glargine  15 Units Subcutaneous BID  . methylPREDNISolone (SOLU-MEDROL) injection  60 mg Intravenous Q12H  . midazolam  2-4 mg Intravenous Once  . moxifloxacin  400 mg Intravenous Q24H  . PARoxetine  30 mg Oral Daily  . pneumococcal 23 valent vaccine  0.5 mL Intramuscular Tomorrow-1000  . propofol      . vancomycin  1,000 mg Intravenous Q12H  . DISCONTD: enoxaparin (LOVENOX) injection  65 mg Subcutaneous BID  . DISCONTD: methylPREDNISolone (SOLU-MEDROL) injection  80 mg Intravenous Q6H  . DISCONTD: vancomycin  750 mg Intravenous Q12H   Assessment: Trough level below goal  Goal of Therapy:  Vancomycin trough level 15-20  mcg/ml  Plan:  Increase Vancomycin to 1000 mg IV every 12 hours. Vancomycin trough f/u at steady state Labs per protocol  Valrie Hart A 06/29/2011,10:19 AM

## 2011-06-29 NOTE — Progress Notes (Signed)
Subjective: She remains intubated on the ventilator. She had problems with hypotension last night in her blood pressure is still in the 90s systolic. She has what appears to be a cardiomyopathy based on echocardiogram. Her cardiac enzymes have been elevated. She is intubated and sedated still  Objective: Vital signs in last 24 hours: Temp:  [98.4 F (36.9 C)-100.1 F (37.8 C)] 100.1 F (37.8 C) (02/20 0400) Pulse Rate:  [97-125] 104  (02/20 0700) Resp:  [12-31] 16  (02/20 0700) BP: (72-130)/(43-106) 91/55 mmHg (02/20 0700) SpO2:  [94 %-100 %] 97 % (02/20 0700) FiO2 (%):  [38.6 %-45 %] 40.9 % (02/20 0700) Weight change:  Last BM Date:  (unknown)  Intake/Output from previous day: 02/19 0701 - 02/20 0700 In: 2621.2 [I.V.:1971.2; IV Piggyback:650] Out: 2150 [Urine:2150]  PHYSICAL EXAM General appearance: alert, cooperative and no distress Resp: clear to auscultation bilaterally Cardio: regular rate and rhythm, S1, S2 normal, no murmur, click, rub or gallop GI: soft, non-tender; bowel sounds normal; no masses,  no organomegaly Extremities: extremities normal, atraumatic, no cyanosis or edema  Lab Results:    Basic Metabolic Panel:  Basename 06/29/11 0425 06/28/11 0427  NA 143 141  K 3.6 4.7  CL 107 105  CO2 26 25  GLUCOSE 128* 238*  BUN 11 10  CREATININE 0.94 0.92  CALCIUM 9.3 8.9  MG -- 2.2  PHOS -- --   Liver Function Tests:  Basename 06/29/11 0425  AST 16  ALT 21  ALKPHOS 105  BILITOT 0.2*  PROT 6.4  ALBUMIN 2.8*   No results found for this basename: LIPASE:2,AMYLASE:2 in the last 72 hours No results found for this basename: AMMONIA:2 in the last 72 hours CBC:  Basename 06/29/11 0425 06/28/11 0427 06/27/11 1908  WBC 12.8* 11.4* --  NEUTROABS -- -- 7.9*  HGB 12.0 13.2 --  HCT 37.1 42.1 --  MCV 89.2 90.0 --  PLT 256 311 --   Cardiac Enzymes:  Basename 06/28/11 1158 06/28/11 0427 06/27/11 2322  CKTOTAL 112 110 102  CKMB 8.5* 9.0* 7.1*  CKMBINDEX --  -- --  TROPONINI 1.69* 1.95* 1.60*   BNP:  Basename 06/27/11 1843  PROBNP 373.8*   D-Dimer:  Basename 06/27/11 1908  DDIMER 2.45*   CBG:  Basename 06/29/11 0403 06/29/11 0101 06/28/11 2040 06/28/11 1624 06/28/11 1159 06/28/11 1157  GLUCAP 126* 191* 170* 133* 186* 197*   Hemoglobin A1C:  Basename 06/27/11 2322  HGBA1C 5.9*   Fasting Lipid Panel: No results found for this basename: CHOL,HDL,LDLCALC,TRIG,CHOLHDL,LDLDIRECT in the last 72 hours Thyroid Function Tests:  Basename 06/28/11 0842  TSH 1.325  T4TOTAL --  FREET4 1.00  T3FREE --  THYROIDAB --   Anemia Panel: No results found for this basename: VITAMINB12,FOLATE,FERRITIN,TIBC,IRON,RETICCTPCT in the last 72 hours Coagulation: No results found for this basename: LABPROT:2,INR:2 in the last 72 hours Urine Drug Screen: Drugs of Abuse     Component Value Date/Time   LABOPIA NONE DETECTED 11/24/2006 1355   COCAINSCRNUR POSITIVE* 11/24/2006 1355   LABBENZ NONE DETECTED 11/24/2006 1355   AMPHETMU NONE DETECTED 11/24/2006 1355   THCU NONE DETECTED 11/24/2006 1355   LABBARB  Value: NONE DETECTED        DRUG SCREEN FOR MEDICAL PURPOSES ONLY.  IF CONFIRMATION IS NEEDED FOR ANY PURPOSE, NOTIFY LAB WITHIN 5 DAYS. 11/24/2006 1355    Alcohol Level: No results found for this basename: ETH:2 in the last 72 hours Urinalysis:  Basename 06/27/11 1853  COLORURINE YELLOW  LABSPEC 1.015  PHURINE  6.0  GLUCOSEU NEGATIVE  HGBUR SMALL*  BILIRUBINUR NEGATIVE  KETONESUR NEGATIVE  PROTEINUR 100*  UROBILINOGEN 0.2  NITRITE NEGATIVE  LEUKOCYTESUR NEGATIVE   Misc. Labs:  ABGS  Basename 06/29/11 0415  PHART 7.436*  PO2ART 95.8  TCO2 23.1  HCO3 26.0*   CULTURES Recent Results (from the past 240 hour(s))  CULTURE, BLOOD (ROUTINE X 2)     Status: Normal (Preliminary result)   Collection Time   06/27/11  6:20 PM      Component Value Range Status Comment   Specimen Description BLOOD RIGHT HAND   Final    Special Requests  BOTTLES DRAWN AEROBIC AND ANAEROBIC 6CC   Final    Culture NO GROWTH 1 DAY   Final    Report Status PENDING   Incomplete   CULTURE, BLOOD (ROUTINE X 2)     Status: Normal (Preliminary result)   Collection Time   06/27/11  6:40 PM      Component Value Range Status Comment   Specimen Description BLOOD RIGHT ANTECUBITAL   Final    Special Requests BOTTLES DRAWN AEROBIC AND ANAEROBIC 8CC   Final    Culture NO GROWTH 1 DAY   Final    Report Status PENDING   Incomplete   URINE CULTURE     Status: Normal   Collection Time   06/27/11  6:53 PM      Component Value Range Status Comment   Specimen Description URINE, CATHETERIZED   Final    Special Requests NONE   Final    Culture  Setup Time [id number]   Final    Colony Count NO GROWTH   Final    Culture NO GROWTH   Final    Report Status 06/29/2011 FINAL   Final   MRSA PCR SCREENING     Status: Normal   Collection Time   06/27/11 11:42 PM      Component Value Range Status Comment   MRSA by PCR NEGATIVE  NEGATIVE  Final    Studies/Results: Ct Angio Chest W/cm &/or Wo Cm  06/28/2011  *RADIOLOGY REPORT*  Clinical Data: Acute respiratory failure; elevated D-dimer. History of asthma.  CT ANGIOGRAPHY CHEST  Technique:  Multidetector CT imaging of the chest using the standard protocol during bolus administration of intravenous contrast. Multiplanar reconstructed images including MIPs were obtained and reviewed to evaluate the vascular anatomy.  Contrast: OMNIPAQUE IOHEXOL 350 MG/ML IV SOLN  Comparison: Chest radiograph performed 06/27/2011, and CTA of the chest performed 01/08/2010  Findings: There is no evidence of pulmonary embolus.  Mild patchy airspace opacity is noted along the medial aspect of the right upper lobe, and minimally in the right middle lobe in a peribronchial distribution.  There is diffuse bilateral peribronchial soft tissue thickening; this may reflect the patient's history of asthma.  Mild bilateral dependent subsegmental  atelectasis is noted.  There is no evidence of pleural effusion or pneumothorax.  No masses are identified; no abnormal focal contrast enhancement is seen.  Diffuse coronary artery calcifications are seen.  The mediastinum is otherwise unremarkable in appearance.  Scattered small periaortic, aortopulmonary window and right paratracheal nodes remain borderline normal in size, without definite evidence for mediastinal lymphadenopathy.  The patient's enteric tube is noted extending to the body of the stomach.  The endotracheal tube is seen ending 2 cm above the carina.  No pericardial effusion is seen.  The great vessels are unremarkable in appearance.  No axillary lymphadenopathy is seen. There is vague asymmetric enlargement  of the right thyroid lobe, with scattered calcification; this appears grossly stable from the prior CTA performed 2011, though correlation with prior lab findings or thyroid ultrasound would be helpful.  The visualized portions of the liver and spleen are unremarkable. The visualized portions of the pancreas, stomach, adrenal glands and kidneys are within normal limits.  No acute osseous abnormalities are seen.  A lucent lesion within the L1 vertebral body was seen to reflect a benign vertebral body hemangioma on the prior MRI of the lumbar spine.  Degenerative change is noted at the upper lumbar spine, with diffuse sclerosis.  IMPRESSION:  1.  No evidence of pulmonary embolus. 2.  Mild patchy airspace opacity at the medial aspect of the right upper lobe, and in a peribronchial distribution in the right middle lobe.  This raises concern for mild pneumonia, possibly atypical in nature. 3.  Diffuse bilateral peribronchial soft tissue thickening; this may reflect the patient's history of asthma, but appears worsened from the prior CTA. 4.  Mild bilateral dependent subsegmental atelectasis noted. 5.  Diffuse coronary artery calcifications seen. 6.  Vague asymmetric enlargement of the right thyroid  lobe, with scattered calcification.  This appears grossly stable from the CTA performed in 2011, but correlation with prior lab findings or thyroid ultrasound would be helpful.  Original Report Authenticated By: Tonia Ghent, M.D.   Portable Chest Xray In Am  06/28/2011  *RADIOLOGY REPORT*  Clinical Data: Acute respiratory distress, on ventilator  PORTABLE CHEST - 1 VIEW  Comparison: Portable chest x-ray of 06/27/2011  Findings: The tip of the endotracheal tube is obscured by overlying electrode leads.  The tip appears to be approximately 1.5 cm above the carina.  Prominent markings are noted particularly at the left lung base and pneumonia cannot be excluded.  No effusion is seen. Heart size is stable.  IMPRESSION:  1.  Tip of endotracheal tube is obscured, probably approximately 1.5 cm above the carina. 2. Prominent markings at the left lung base suspicious for pneumonia.  Original Report Authenticated By: Juline Patch, M.D.   Dg Chest Port 1 View  06/27/2011  *RADIOLOGY REPORT*  Clinical Data: Intubated.  Smoker.  COPD.  Dyspnea.  PORTABLE CHEST - 1 VIEW  Comparison: 05/26/2011.  Findings: Endotracheal tube in satisfactory position.  Nasogastric tube extending into the stomach.  Stable normal sized heart.  The lungs are clear and mildly hyperexpanded with stable mildly prominent interstitial markings.  Unremarkable bones.  IMPRESSION:  1.  Endotracheal tube in satisfactory position. 2.  COPD.  Original Report Authenticated By: Darrol Angel, M.D.    Medications:  Prior to Admission:  Prescriptions prior to admission  Medication Sig Dispense Refill  . albuterol (PROVENTIL) (2.5 MG/3ML) 0.083% nebulizer solution Take 2.5 mg by nebulization every 6 (six) hours as needed.        . ALPRAZolam (XANAX) 0.5 MG tablet Take 0.5 mg by mouth 4 (four) times daily as needed.       . cyclobenzaprine (FLEXERIL) 10 MG tablet Take 5 mg by mouth 2 (two) times daily.       Marland Kitchen dexlansoprazole (DEXILANT) 60 MG  capsule Take 60 mg by mouth daily.      . Fluticasone-Salmeterol (ADVAIR) 250-50 MCG/DOSE AEPB Inhale 1 puff into the lungs every 12 (twelve) hours.        . gabapentin (NEURONTIN) 300 MG capsule Take 300 mg by mouth 2 (two) times daily.        Marland Kitchen OVER THE COUNTER MEDICATION Take  1 capsule by mouth every morning. **Calcium 600 + Vitamin D 1200 Combination Tablet**      . PARoxetine (PAXIL) 30 MG tablet Take 30 mg by mouth every morning.        . polyethylene glycol powder (GLYCOLAX/MIRALAX) powder Take 17 g by mouth daily.      . roflumilast (DALIRESP) 500 MCG TABS tablet Take 500 mcg by mouth daily.      Marland Kitchen tiotropium (SPIRIVA) 18 MCG inhalation capsule Place 18 mcg into inhaler and inhale daily.        . traZODone (DESYREL) 100 MG tablet Take 100 mg by mouth at bedtime.       Marland Kitchen albuterol (PROVENTIL HFA;VENTOLIN HFA) 108 (90 BASE) MCG/ACT inhaler Inhale 1-2 puffs into the lungs every 6 (six) hours as needed for wheezing.  1 Inhaler  0  . oxyCODONE-acetaminophen (PERCOCET) 5-325 MG per tablet Take 0.5 tablets by mouth every 4 (four) hours as needed. Back spurs      . zolpidem (AMBIEN) 5 MG tablet Take 2 tablets (10 mg total) by mouth at bedtime as needed for sleep.  15 tablet  0   Scheduled:   . albuterol  2.5 mg Nebulization Q4H   And  . ipratropium  0.5 mg Nebulization Q4H  . antiseptic oral rinse  15 mL Mouth Rinse QID  . aspirin  325 mg Per NG tube Daily  . chlorhexidine  15 mL Mouth Rinse BID  . enalaprilat  1.25 mg Intravenous Q6H  . enoxaparin (LOVENOX) injection  40 mg Subcutaneous Q24H  . famotidine (PEPCID) IV  20 mg Intravenous Q12H  . Fluticasone-Salmeterol  1 puff Inhalation Q12H  . furosemide  20 mg Intravenous Once  . influenza  inactive virus vaccine  0.5 mL Intramuscular Tomorrow-1000  . insulin aspart  0-20 Units Subcutaneous Q4H  . insulin glargine  15 Units Subcutaneous BID  . methylPREDNISolone (SOLU-MEDROL) injection  80 mg Intravenous Q6H  . moxifloxacin  400 mg  Intravenous Q24H  . PARoxetine  30 mg Oral Daily  . pneumococcal 23 valent vaccine  0.5 mL Intramuscular Tomorrow-1000  . propofol      . vancomycin  750 mg Intravenous Q12H  . DISCONTD: enoxaparin (LOVENOX) injection  65 mg Subcutaneous BID  . DISCONTD: insulin glargine  10 Units Subcutaneous QHS   Continuous:   . dextrose 5 % and 0.45% NaCl 75 mL/hr at 06/29/11 0600  . fentaNYL infusion INTRAVENOUS    . propofol 20 mcg/kg/min (06/29/11 0600)   ZOX:[id number], fentaNYL, ipratropium, LORazepam  Assesment: She has respiratory failure. She is somewhat hypotensive now. This may be related to the propofol. She also however has a cardiomyopathy which may be part of the problem Principal Problem:  *Acute respiratory failure Active Problems:  ANXIETY  DEPRESSION  Other chronic pain  COPD exacerbation  Demand ischemia of myocardium  Hyperglycemia  Thyroid enlargement  Ischemic cardiomyopathy  MI, acute, non ST segment elevation  PNA (pneumonia)    Plan: I'm not sure for what appeared able to make any progress toward extubation. Her hypertension is worrisome. However I like to see what she does if we can bring her off of some of the propofol and see what her blood pressure does.    LOS: 2 days   Drishti Pepperman L 06/29/2011, 7:35 AM

## 2011-06-29 NOTE — Procedures (Signed)
Extubation Procedure Note Pt extubated 0920. Pt's NIF -45 and FVC 1L, she followed simple direction and the ABG was in normal range. Pt was placed on 4L [place] and then was decreased to 3L [place]. Pt looks good and is able to cough and talk. BP 110/67 Patient Details:   Name: Kristin Wells DOB: Dec 26, 1950 MRN: [id number]   Airway Documentation:  Airway 7 mm (Active)  Secured at (cm) 22 cm 06/29/2011  8:01 AM  Measured From Lips 06/29/2011  8:01 AM  Secured Location Left 06/29/2011  8:01 AM  Secured By [name] 06/29/2011  8:01 AM  Tube Holder Repositioned Yes 06/29/2011  8:01 AM  Cuff Pressure (cm H2O) 22 cm H2O 06/29/2011  8:01 AM  Site Condition Dry 06/29/2011  8:01 AM    Evaluation  O2 sats: stable throughout Complications: No apparent complications Patient did tolerate procedure well. Bilateral Breath Sounds: Diminished Suctioning: Airway Yes  Katheren Shams 06/29/2011, 10:19 AM

## 2011-06-30 LAB — BASIC METABOLIC PANEL
BUN: 12 mg/dL (ref 6–23)
CO2: 27 mEq/L (ref 19–32)
Calcium: 8.8 mg/dL (ref 8.4–10.5)
Chloride: 106 mEq/L (ref 96–112)
Creatinine, Ser: 0.73 mg/dL (ref 0.50–1.10)
GFR calc Af Amer: >90 mL/min (ref >90)
GFR calc non Af Amer: >90 mL/min (ref >90)
Glucose, Bld: 132 mg/dL — ABNORMAL HIGH (ref 70–99)
Potassium: 3.6 mEq/L (ref 3.5–5.1)
Sodium: 141 mEq/L (ref 135–145)

## 2011-06-30 LAB — GLUCOSE, CAPILLARY
Glucose-Capillary: 113 mg/dL — ABNORMAL HIGH (ref 70–99)
Glucose-Capillary: 142 mg/dL — ABNORMAL HIGH (ref 70–99)
Glucose-Capillary: 154 mg/dL — ABNORMAL HIGH (ref 70–99)

## 2011-06-30 LAB — PRO B NATRIURETIC PEPTIDE: Pro B Natriuretic peptide (BNP): 5949 pg/mL — ABNORMAL HIGH (ref 0–125)

## 2011-06-30 LAB — TRIGLYCERIDES: Triglycerides: 121 mg/dL (ref <150)

## 2011-06-30 MED ORDER — ZOLPIDEM TARTRATE 5 MG PO TABS
10.0000 mg | ORAL_TABLET | Freq: Every evening | ORAL | Status: DC | PRN
  Administered 2011-06-30: 10 mg via ORAL
  Filled 2011-06-30: qty 2

## 2011-06-30 MED ORDER — ALPRAZOLAM 0.5 MG PO TABS
0.5000 mg | ORAL_TABLET | Freq: Four times a day (QID) | ORAL | Status: DC | PRN
  Administered 2011-06-30 – 2011-07-03 (×3): 0.5 mg via ORAL
  Filled 2011-06-30 (×3): qty 1

## 2011-06-30 MED ORDER — POLYETHYLENE GLYCOL 3350 17 GM/SCOOP PO POWD
17.0000 g | Freq: Every day | ORAL | Status: DC
  Filled 2011-06-30: qty 255

## 2011-06-30 MED ORDER — INSULIN GLARGINE 100 UNIT/ML ~~LOC~~ SOLN
10.0000 [IU] | Freq: Two times a day (BID) | SUBCUTANEOUS | Status: DC
  Administered 2011-06-30 – 2011-07-01 (×3): 10 [IU] via SUBCUTANEOUS

## 2011-06-30 MED ORDER — MAGNESIUM HYDROXIDE 400 MG/5ML PO SUSP
30.0000 mL | Freq: Every day | ORAL | Status: DC
  Administered 2011-06-30 – 2011-07-01 (×2): 30 mL via ORAL
  Filled 2011-06-30 (×2): qty 30

## 2011-06-30 MED ORDER — ROSUVASTATIN CALCIUM 20 MG PO TABS
10.0000 mg | ORAL_TABLET | Freq: Every day | ORAL | Status: DC
  Administered 2011-06-30 – 2011-07-03 (×4): 10 mg via ORAL
  Administered 2011-07-04: 5 mg via ORAL
  Filled 2011-06-30 (×6): qty 1

## 2011-06-30 MED ORDER — OXYCODONE-ACETAMINOPHEN 5-325 MG PO TABS: 0.5000 | ORAL_TABLET | ORAL | Status: DC | PRN

## 2011-06-30 MED ORDER — CLOPIDOGREL BISULFATE 75 MG PO TABS
75.0000 mg | ORAL_TABLET | Freq: Every day | ORAL | Status: DC
  Administered 2011-06-30 – 2011-07-05 (×6): 75 mg via ORAL
  Filled 2011-06-30 (×6): qty 1

## 2011-06-30 MED ORDER — SODIUM CHLORIDE 0.9 % IJ SOLN
INTRAMUSCULAR | Status: AC
  Filled 2011-06-30: qty 3

## 2011-06-30 MED ORDER — POLYETHYLENE GLYCOL 3350 17 G PO PACK
17.0000 g | PACK | Freq: Every day | ORAL | Status: DC
  Administered 2011-06-30 – 2011-07-03 (×4): 17 g via ORAL
  Filled 2011-06-30 (×4): qty 1

## 2011-06-30 MED ORDER — FUROSEMIDE 20 MG PO TABS
20.0000 mg | ORAL_TABLET | Freq: Every day | ORAL | Status: DC
  Administered 2011-06-30 – 2011-07-03 (×4): 20 mg via ORAL
  Filled 2011-06-30 (×3): qty 1

## 2011-06-30 MED ORDER — PANTOPRAZOLE SODIUM 40 MG PO TBEC
40.0000 mg | DELAYED_RELEASE_TABLET | Freq: Every day | ORAL | Status: DC
  Administered 2011-06-30 – 2011-07-05 (×6): 40 mg via ORAL
  Filled 2011-06-30 (×6): qty 1

## 2011-06-30 MED ORDER — TRAZODONE HCL 50 MG PO TABS
100.0000 mg | ORAL_TABLET | Freq: Every day | ORAL | Status: DC
  Administered 2011-06-30 – 2011-07-04 (×5): 100 mg via ORAL
  Filled 2011-06-30 (×5): qty 2

## 2011-06-30 MED ORDER — PREDNISONE 20 MG PO TABS
40.0000 mg | ORAL_TABLET | Freq: Two times a day (BID) | ORAL | Status: DC
  Administered 2011-06-30 – 2011-07-01 (×3): 40 mg via ORAL
  Filled 2011-06-30: qty 1
  Filled 2011-06-30: qty 2
  Filled 2011-06-30: qty 1
  Filled 2011-06-30: qty 2

## 2011-06-30 MED ORDER — GABAPENTIN 300 MG PO CAPS
300.0000 mg | ORAL_CAPSULE | Freq: Two times a day (BID) | ORAL | Status: DC
  Administered 2011-06-30 – 2011-07-05 (×11): 300 mg via ORAL
  Filled 2011-06-30 (×11): qty 1

## 2011-06-30 MED ORDER — MOXIFLOXACIN HCL 400 MG PO TABS
400.0000 mg | ORAL_TABLET | Freq: Every day | ORAL | Status: AC
  Administered 2011-06-30 – 2011-07-04 (×5): 400 mg via ORAL
  Filled 2011-06-30 (×5): qty 1

## 2011-06-30 NOTE — Progress Notes (Addendum)
Chart reviewed.  Subjective: Breathing easier. still feels weak. Having a lot of stress and anxiety.  Objective: Vital signs in last 24 hours: Filed Vitals:   06/30/11 0700 06/30/11 0728 06/30/11 0800 06/30/11 0900  BP: 130/84  114/67 126/91  Pulse: 67  101 89  Temp:   97.7 F (36.5 C)   TempSrc:   Oral   Resp: 20  23 22   Height:      Weight:      SpO2: 95% 96% 94% 94%   Weight change:   Intake/Output Summary (Last 24 hours) at 06/30/11 1030 Last data filed at 06/30/11 0900  Gross per 24 hour  Intake 3099.92 ml  Output   3400 ml  Net -300.08 ml   Physical Exam: Gen.: On,. Alert and oriented. Lungs clear to auscultation bilaterally without wheezes rhonchi or rales Cardiovascular regular rate and rhythm without murmurs gallops rubs Abdomen soft nontender nondistended Extremities no clubbing cyanosis or edema Psychiatric: Normal affect. Comment cooperative.  Lab Results: Basic Metabolic Panel:  Lab 06/30/11 1610 06/29/11 0425 06/28/11 0427  NA 141 143 --  K 3.6 3.6 --  CL 106 107 --  CO2 27 26 --  GLUCOSE 132* 128* --  BUN 12 11 --  CREATININE 0.73 0.94 --  CALCIUM 8.8 9.3 --  MG -- -- 2.2  PHOS -- -- --   Liver Function Tests:  Lab 06/29/11 0425  AST 16  ALT 21  ALKPHOS 105  BILITOT 0.2*  PROT 6.4  ALBUMIN 2.8*   No results found for this basename: LIPASE:2,AMYLASE:2 in the last 168 hours No results found for this basename: AMMONIA:2 in the last 168 hours CBC:  Lab 06/29/11 0425 06/28/11 0427 06/27/11 1908  WBC 12.8* 11.4* --  NEUTROABS -- -- 7.9*  HGB 12.0 13.2 --  HCT 37.1 42.1 --  MCV 89.2 90.0 --  PLT 256 311 --   Cardiac Enzymes:  Lab 06/28/11 1158 06/28/11 0427 06/27/11 2322  CKTOTAL 112 110 102  CKMB 8.5* 9.0* 7.1*  CKMBINDEX -- -- --  TROPONINI 1.69* 1.95* 1.60*   BNP:  Lab 06/30/11 0910 06/27/11 1843  PROBNP 5949.0* 373.8*   D-Dimer:  Lab 06/27/11 1908  DDIMER 2.45*   CBG:  Lab 06/30/11 0730 06/29/11 2217 06/29/11  1944 06/29/11 1550 06/29/11 1253 06/29/11 0732  GLUCAP 113* 157* 245* 129* 133* 131*   Hemoglobin A1C:  Lab 06/27/11 2322  HGBA1C 5.9*   Fasting Lipid Panel:  Lab 06/30/11 0408  CHOL --  HDL --  LDLCALC --  TRIG 121  CHOLHDL --  LDLDIRECT --   Thyroid Function Tests:  Lab 06/28/11 0842  TSH 1.325  T4TOTAL --  FREET4 1.00  T3FREE --  THYROIDAB --   Coagulation: No results found for this basename: LABPROT:4,INR:4 in the last 168 hours Anemia Panel: No results found for this basename: VITAMINB12,FOLATE,FERRITIN,TIBC,IRON,RETICCTPCT in the last 168 hours Urine Drug Screen: Drugs of Abuse     Component Value Date/Time   LABOPIA NONE DETECTED 11/24/2006 1355   COCAINSCRNUR POSITIVE* 11/24/2006 1355   LABBENZ NONE DETECTED 11/24/2006 1355   AMPHETMU NONE DETECTED 11/24/2006 1355   THCU NONE DETECTED 11/24/2006 1355   LABBARB  Value: NONE DETECTED        DRUG SCREEN FOR MEDICAL PURPOSES ONLY.  IF CONFIRMATION IS NEEDED FOR ANY PURPOSE, NOTIFY LAB WITHIN 5 DAYS. 11/24/2006 1355    Alcohol Level: No results found for this basename: ETH:2 in the last 168 hours Urinalysis:  Lab 06/27/11  1853  COLORURINE YELLOW  LABSPEC 1.015  PHURINE 6.0  GLUCOSEU NEGATIVE  HGBUR SMALL*  BILIRUBINUR NEGATIVE  KETONESUR NEGATIVE  PROTEINUR 100*  UROBILINOGEN 0.2  NITRITE NEGATIVE  LEUKOCYTESUR NEGATIVE    Micro Results: Recent Results (from the past 240 hour(s))  CULTURE, BLOOD (ROUTINE X 2)     Status: Normal (Preliminary result)   Collection Time   06/27/11  6:20 PM      Component Value Range Status Comment   Specimen Description BLOOD RIGHT HAND   Final    Special Requests BOTTLES DRAWN AEROBIC AND ANAEROBIC 6CC   Final    Culture NO GROWTH 3 DAYS   Final    Report Status PENDING   Incomplete   CULTURE, BLOOD (ROUTINE X 2)     Status: Normal (Preliminary result)   Collection Time   06/27/11  6:40 PM      Component Value Range Status Comment   Specimen Description BLOOD RIGHT  ANTECUBITAL   Final    Special Requests BOTTLES DRAWN AEROBIC AND ANAEROBIC 8CC   Final    Culture NO GROWTH 3 DAYS   Final    Report Status PENDING   Incomplete   URINE CULTURE     Status: Normal   Collection Time   06/27/11  6:53 PM      Component Value Range Status Comment   Specimen Description URINE, CATHETERIZED   Final    Special Requests NONE   Final    Culture  Setup Time [id number]   Final    Colony Count NO GROWTH   Final    Culture NO GROWTH   Final    Report Status 06/29/2011 FINAL   Final   MRSA PCR SCREENING     Status: Normal   Collection Time   06/27/11 11:42 PM      Component Value Range Status Comment   MRSA by PCR NEGATIVE  NEGATIVE  Final    Studies/Results: Dg Chest Port 1 View  06/29/2011  *RADIOLOGY REPORT*  Clinical Data: Ventilator dependent respiratory failure.  Follow up left basilar atelectasis and/or pneumonia.  PORTABLE CHEST - 1 VIEW 06/29/2011 0525 hours:  Comparison: Portable chest x-rays yesterday, 06/27/2011, and 10/27/2009 [facility].  CTA chest yesterday.  Findings: Endotracheal tube tip approximately 2-3 cm above the carina.  Interval improvement in the opacities at the left lung base, with mild linear atelectasis persisting.  Mildly prominent bronchovascular markings diffusely, unchanged.  No new pulmonary parenchymal abnormalities.  Nasogastric courses below the diaphragm into the stomach but its tip is not included on the image.  Cardiac silhouette normal in size for the AP portable technique.  IMPRESSION: Support apparatus satisfactory.  Improved aeration of the left lung base, with mild atelectasis persisting.  Stable mild changes of chronic bronchitis/asthma.  No new abnormalities.  Original Report Authenticated By: Arnell Sieving, M.D.   Scheduled Meds:   . albuterol  2.5 mg Nebulization QID   And  . ipratropium  0.5 mg Nebulization QID  . aspirin  325 mg Per NG tube Daily  . enalapril  2.5 mg Oral Daily  . enoxaparin (LOVENOX)  injection  40 mg Subcutaneous Q24H  . famotidine (PEPCID) IV  20 mg Intravenous Q12H  . Fluticasone-Salmeterol  1 puff Inhalation Q12H  . furosemide  20 mg Oral Daily  . influenza  inactive virus vaccine  0.5 mL Intramuscular Tomorrow-1000  . insulin aspart  0-20 Units Subcutaneous TID WC  . insulin aspart  0-5 Units  Subcutaneous QHS  . insulin glargine  15 Units Subcutaneous BID  . methylPREDNISolone (SOLU-MEDROL) injection  60 mg Intravenous Q12H  . moxifloxacin  400 mg Intravenous Q24H  . PARoxetine  30 mg Oral Daily  . pneumococcal 23 valent vaccine  0.5 mL Intramuscular Tomorrow-1000  . sodium chloride      . vancomycin  1,000 mg Intravenous Q12H  . DISCONTD: albuterol  2.5 mg Nebulization Q4H  . DISCONTD: antiseptic oral rinse  15 mL Mouth Rinse QID  . DISCONTD: chlorhexidine  15 mL Mouth Rinse BID  . DISCONTD: enalaprilat  1.25 mg Intravenous Q6H  . DISCONTD: insulin aspart  0-20 Units Subcutaneous Q4H  . DISCONTD: ipratropium  0.5 mg Nebulization Q4H  . DISCONTD: vancomycin  1,000 mg Intravenous Q12H   Continuous Infusions:   . dextrose 5 % and 0.45% NaCl 70 mL/hr at 06/30/11 0800  . fentaNYL infusion INTRAVENOUS    . DISCONTD: propofol Stopped (06/29/11 0815)   PRN Meds:.albuterol, fentaNYL, fentaNYL, ipratropium, DISCONTD: LORazepam, DISCONTD: midazolam Assessment/Plan: Principal Problem:  *Acute respiratory failure, extubated Active Problems:  COPD exacerbation  Ischemic cardiomyopathy  MI, acute, non ST segment elevation  PNA (pneumonia)  Demand ischemia of myocardium  ANXIETY  DEPRESSION  Other chronic pain  Hyperglycemia, steroid induced  Thyroid enlargement  Hypotension, resolved Gastroesophageal reflux disease  Transfer to telemetry. Changed Avelox and steroids to by mouth. Resume some of her home medications. See orders. Start steroid taper and decrease Lantus. Continue vancomycin for now. Can probably discontinue in one to 2 days. Per cardiology, well  need a Myoview next week. if ischemia, will need to consider cardiac catheterization. Medical management non-ST segment elevation MI for now. Increase activity with physical therapy consult.   LOS: 3 days   Drisana Schweickert L 06/30/2011, 10:30 AM

## 2011-06-30 NOTE — Progress Notes (Signed)
Subjective: She was able to be extubated yesterday. She says she feels well and has no complaints. She is coughing some but she's not short of breath.  Objective: Vital signs in last 24 hours: Temp:  [98 F (36.7 C)-99 F (37.2 C)] 98.1 F (36.7 C) (02/21 0400) Pulse Rate:  [77-116] 77  (02/21 0500) Resp:  [16-28] 21  (02/21 0500) BP: (91-125)/(57-89) 119/87 mmHg (02/21 0500) SpO2:  [92 %-100 %] 96 % (02/21 0728) FiO2 (%):  [38.4 %-39.4 %] 39.4 % (02/20 0900) Weight:  [71.5 kg (157 lb 10.1 oz)] 71.5 kg (157 lb 10.1 oz) (02/21 0500) Weight change:  Last BM Date: 06/24/11  Intake/Output from previous day: 02/20 0701 - 02/21 0700 In: 2606.8 [P.O.:720; I.V.:1686.8; IV Piggyback:200] Out: 3150 [Urine:3050; Emesis/NG output:100]  PHYSICAL EXAM General appearance: alert, cooperative and no distress Resp: rhonchi bilaterally Cardio: regular rate and rhythm, S1, S2 normal, no murmur, click, rub or gallop GI: soft, non-tender; bowel sounds normal; no masses,  no organomegaly Extremities: extremities normal, atraumatic, no cyanosis or edema  Lab Results:    Basic Metabolic Panel:  Basename 06/30/11 0412 06/29/11 0425 06/28/11 0427  NA 141 143 --  K 3.6 3.6 --  CL 106 107 --  CO2 27 26 --  GLUCOSE 132* 128* --  BUN 12 11 --  CREATININE 0.73 0.94 --  CALCIUM 8.8 9.3 --  MG -- -- 2.2  PHOS -- -- --   Liver Function Tests:  Basename 06/29/11 0425  AST 16  ALT 21  ALKPHOS 105  BILITOT 0.2*  PROT 6.4  ALBUMIN 2.8*   No results found for this basename: LIPASE:2,AMYLASE:2 in the last 72 hours No results found for this basename: AMMONIA:2 in the last 72 hours CBC:  Basename 06/29/11 0425 06/28/11 0427 06/27/11 1908  WBC 12.8* 11.4* --  NEUTROABS -- -- 7.9*  HGB 12.0 13.2 --  HCT 37.1 42.1 --  MCV 89.2 90.0 --  PLT 256 311 --   Cardiac Enzymes:  Basename 06/28/11 1158 06/28/11 0427 06/27/11 2322  CKTOTAL 112 110 102  CKMB 8.5* 9.0* 7.1*  CKMBINDEX -- -- --    TROPONINI 1.69* 1.95* 1.60*   BNP:  Basename 06/27/11 1843  PROBNP 373.8*   D-Dimer:  Basename 06/27/11 1908  DDIMER 2.45*   CBG:  Basename 06/29/11 2217 06/29/11 1944 06/29/11 1550 06/29/11 1253 06/29/11 0732 06/29/11 0403  GLUCAP 157* 245* 129* 133* 131* 126*   Hemoglobin A1C:  Basename 06/27/11 2322  HGBA1C 5.9*   Fasting Lipid Panel:  Basename 06/30/11 0408  CHOL --  HDL --  LDLCALC --  TRIG 121  CHOLHDL --  LDLDIRECT --   Thyroid Function Tests:  Basename 06/28/11 0842  TSH 1.325  T4TOTAL --  FREET4 1.00  T3FREE --  THYROIDAB --   Anemia Panel: No results found for this basename: VITAMINB12,FOLATE,FERRITIN,TIBC,IRON,RETICCTPCT in the last 72 hours Coagulation: No results found for this basename: LABPROT:2,INR:2 in the last 72 hours Urine Drug Screen: Drugs of Abuse     Component Value Date/Time   LABOPIA NONE DETECTED 11/24/2006 1355   COCAINSCRNUR POSITIVE* 11/24/2006 1355   LABBENZ NONE DETECTED 11/24/2006 1355   AMPHETMU NONE DETECTED 11/24/2006 1355   THCU NONE DETECTED 11/24/2006 1355   LABBARB  Value: NONE DETECTED        DRUG SCREEN FOR MEDICAL PURPOSES ONLY.  IF CONFIRMATION IS NEEDED FOR ANY PURPOSE, NOTIFY LAB WITHIN 5 DAYS. 11/24/2006 1355    Alcohol Level: No results found for  this basename: ETH:2 in the last 72 hours Urinalysis:  Basename 06/27/11 1853  COLORURINE YELLOW  LABSPEC 1.015  PHURINE 6.0  GLUCOSEU NEGATIVE  HGBUR SMALL*  BILIRUBINUR NEGATIVE  KETONESUR NEGATIVE  PROTEINUR 100*  UROBILINOGEN 0.2  NITRITE NEGATIVE  LEUKOCYTESUR NEGATIVE   Misc. Labs:  ABGS  Basename 06/29/11 0939  PHART 7.386  PO2ART 133.0*  TCO2 23.1  HCO3 25.8*   CULTURES Recent Results (from the past 240 hour(s))  CULTURE, BLOOD (ROUTINE X 2)     Status: Normal (Preliminary result)   Collection Time   06/27/11  6:20 PM      Component Value Range Status Comment   Specimen Description BLOOD RIGHT HAND   Final    Special Requests BOTTLES  DRAWN AEROBIC AND ANAEROBIC 6CC   Final    Culture NO GROWTH 2 DAYS   Final    Report Status PENDING   Incomplete   CULTURE, BLOOD (ROUTINE X 2)     Status: Normal (Preliminary result)   Collection Time   06/27/11  6:40 PM      Component Value Range Status Comment   Specimen Description BLOOD RIGHT ANTECUBITAL   Final    Special Requests BOTTLES DRAWN AEROBIC AND ANAEROBIC 8CC   Final    Culture NO GROWTH 2 DAYS   Final    Report Status PENDING   Incomplete   URINE CULTURE     Status: Normal   Collection Time   06/27/11  6:53 PM      Component Value Range Status Comment   Specimen Description URINE, CATHETERIZED   Final    Special Requests NONE   Final    Culture  Setup Time [id number]   Final    Colony Count NO GROWTH   Final    Culture NO GROWTH   Final    Report Status 06/29/2011 FINAL   Final   MRSA PCR SCREENING     Status: Normal   Collection Time   06/27/11 11:42 PM      Component Value Range Status Comment   MRSA by PCR NEGATIVE  NEGATIVE  Final    Studies/Results: Dg Chest Port 1 View  06/29/2011  *RADIOLOGY REPORT*  Clinical Data: Ventilator dependent respiratory failure.  Follow up left basilar atelectasis and/or pneumonia.  PORTABLE CHEST - 1 VIEW 06/29/2011 0525 hours:  Comparison: Portable chest x-rays yesterday, 06/27/2011, and 10/27/2009 [facility].  CTA chest yesterday.  Findings: Endotracheal tube tip approximately 2-3 cm above the carina.  Interval improvement in the opacities at the left lung base, with mild linear atelectasis persisting.  Mildly prominent bronchovascular markings diffusely, unchanged.  No new pulmonary parenchymal abnormalities.  Nasogastric courses below the diaphragm into the stomach but its tip is not included on the image.  Cardiac silhouette normal in size for the AP portable technique.  IMPRESSION: Support apparatus satisfactory.  Improved aeration of the left lung base, with mild atelectasis persisting.  Stable mild changes of chronic  bronchitis/asthma.  No new abnormalities.  Original Report Authenticated By: Arnell Sieving, M.D.    Medications:  Scheduled:   . albuterol  2.5 mg Nebulization QID   And  . ipratropium  0.5 mg Nebulization QID  . aspirin  325 mg Per NG tube Daily  . enalapril  2.5 mg Oral Daily  . enoxaparin (LOVENOX) injection  40 mg Subcutaneous Q24H  . famotidine (PEPCID) IV  20 mg Intravenous Q12H  . Fluticasone-Salmeterol  1 puff Inhalation Q12H  . influenza  inactive virus vaccine  0.5 mL Intramuscular Tomorrow-1000  . insulin aspart  0-20 Units Subcutaneous TID WC  . insulin aspart  0-5 Units Subcutaneous QHS  . insulin glargine  15 Units Subcutaneous BID  . methylPREDNISolone (SOLU-MEDROL) injection  60 mg Intravenous Q12H  . midazolam  2-4 mg Intravenous Once  . moxifloxacin  400 mg Intravenous Q24H  . PARoxetine  30 mg Oral Daily  . pneumococcal 23 valent vaccine  0.5 mL Intramuscular Tomorrow-1000  . sodium chloride      . vancomycin  1,000 mg Intravenous Q12H  . DISCONTD: albuterol  2.5 mg Nebulization Q4H  . DISCONTD: antiseptic oral rinse  15 mL Mouth Rinse QID  . DISCONTD: chlorhexidine  15 mL Mouth Rinse BID  . DISCONTD: enalaprilat  1.25 mg Intravenous Q6H  . DISCONTD: insulin aspart  0-20 Units Subcutaneous Q4H  . DISCONTD: ipratropium  0.5 mg Nebulization Q4H  . DISCONTD: methylPREDNISolone (SOLU-MEDROL) injection  80 mg Intravenous Q6H  . DISCONTD: vancomycin  750 mg Intravenous Q12H  . DISCONTD: vancomycin  1,000 mg Intravenous Q12H   Continuous:   . dextrose 5 % and 0.45% NaCl 70 mL/hr at 06/30/11 0500  . fentaNYL infusion INTRAVENOUS    . DISCONTD: propofol Stopped (06/29/11 0815)   BJY:NWGNFAOZH, fentaNYL, fentaNYL, ipratropium, DISCONTD: LORazepam, DISCONTD: midazolam  Assesment: She was admitted with acute respiratory failure which I think is related to multiple problems including COPD ischemic cardiomyopathy. She is much improved and off the ventilator. She  looks very comfortable. She says she's going to give up smoking. Principal Problem:  *Acute respiratory failure Active Problems:  ANXIETY  DEPRESSION  Other chronic pain  COPD exacerbation  Demand ischemia of myocardium  Hyperglycemia  Thyroid enlargement  Ischemic cardiomyopathy  MI, acute, non ST segment elevation  PNA (pneumonia)  Hypotension    Plan: No change in treatments right now. I would continue with nebulizers antibiotics et Karie Soda.    LOS: 3 days   Alycea Segoviano L 06/30/2011, 7:57 AM

## 2011-06-30 NOTE — Progress Notes (Signed)
ANTIBIOTIC CONSULT NOTE -   Pharmacy Consult for  Vancomycin Indication: rule out pneumonia  No Known Allergies  Patient Measurements: Height: 5\' 2"  (157.5 cm) Weight: 157 lb 10.1 oz (71.5 kg) IBW/kg (Calculated) : 50.1  Adjusted Body Weight: 67 kg  Vital Signs: Temp: 98.1 F (36.7 C) (02/21 0400) Temp src: Oral (02/21 0400) BP: 119/87 mmHg (02/21 0500) Pulse Rate: 77  (02/21 0500) Intake/Output from previous day: 02/20 0701 - 02/21 0700 In: 2606.8 [P.O.:720; I.V.:1686.8; IV Piggyback:200] Out: 3150 [Urine:3050; Emesis/NG output:100] Intake/Output from this shift:    Labs:  RECENT VANCOMYCIN TROUGH LEVELS: Recent Labs  Basename 06/29/11 0908   VANCOTROUGH 11.3    Basename 06/30/11 0412 06/29/11 0425 06/28/11 0427 06/27/11 1908  WBC -- 12.8* 11.4* 13.9*  HGB -- 12.0 13.2 13.8  PLT -- 256 311 385  LABCREA -- -- -- --  CREATININE 0.73 0.94 0.92 --   Estimated Creatinine Clearance: 70.2 ml/min (by C-G formula based on Cr of 0.73).    Microbiology: Recent Results (from the past 720 hour(s))  CULTURE, BLOOD (ROUTINE X 2)     Status: Normal (Preliminary result)   Collection Time   06/27/11  6:20 PM      Component Value Range Status Comment   Specimen Description BLOOD RIGHT HAND   Final    Special Requests BOTTLES DRAWN AEROBIC AND ANAEROBIC 6CC   Final    Culture NO GROWTH 2 DAYS   Final    Report Status PENDING   Incomplete   CULTURE, BLOOD (ROUTINE X 2)     Status: Normal (Preliminary result)   Collection Time   06/27/11  6:40 PM      Component Value Range Status Comment   Specimen Description BLOOD RIGHT ANTECUBITAL   Final    Special Requests BOTTLES DRAWN AEROBIC AND ANAEROBIC 8CC   Final    Culture NO GROWTH 2 DAYS   Final    Report Status PENDING   Incomplete   URINE CULTURE     Status: Normal   Collection Time   06/27/11  6:53 PM      Component Value Range Status Comment   Specimen Description URINE, CATHETERIZED   Final    Special Requests NONE    Final    Culture  Setup Time [id number]   Final    Colony Count NO GROWTH   Final    Culture NO GROWTH   Final    Report Status 06/29/2011 FINAL   Final   MRSA PCR SCREENING     Status: Normal   Collection Time   06/27/11 11:42 PM      Component Value Range Status Comment   MRSA by PCR NEGATIVE  NEGATIVE  Final     Medical History: Past Medical History  Diagnosis Date  . COPD (chronic obstructive pulmonary disease)   . Asthma   . Bronchitis   . Anxiety   . Back pain   . Demand ischemia of myocardium 06/28/2011  . Thyroid enlargement 06/28/2011  . MI, acute, non ST segment elevation 06/29/2011  . Ischemic cardiomyopathy 06/28/2011    EF 25%  . PNA (pneumonia) 06/29/2011    Medications:  Scheduled:     . albuterol  2.5 mg Nebulization QID   And  . ipratropium  0.5 mg Nebulization QID  . aspirin  325 mg Per NG tube Daily  . enalapril  2.5 mg Oral Daily  . enoxaparin (LOVENOX) injection  40 mg Subcutaneous Q24H  . famotidine (PEPCID)  IV  20 mg Intravenous Q12H  . Fluticasone-Salmeterol  1 puff Inhalation Q12H  . influenza  inactive virus vaccine  0.5 mL Intramuscular Tomorrow-1000  . insulin aspart  0-20 Units Subcutaneous TID WC  . insulin aspart  0-5 Units Subcutaneous QHS  . insulin glargine  15 Units Subcutaneous BID  . methylPREDNISolone (SOLU-MEDROL) injection  60 mg Intravenous Q12H  . midazolam  2-4 mg Intravenous Once  . moxifloxacin  400 mg Intravenous Q24H  . PARoxetine  30 mg Oral Daily  . pneumococcal 23 valent vaccine  0.5 mL Intramuscular Tomorrow-1000  . sodium chloride      . vancomycin  1,000 mg Intravenous Q12H  . DISCONTD: albuterol  2.5 mg Nebulization Q4H  . DISCONTD: antiseptic oral rinse  15 mL Mouth Rinse QID  . DISCONTD: chlorhexidine  15 mL Mouth Rinse BID  . DISCONTD: enalaprilat  1.25 mg Intravenous Q6H  . DISCONTD: insulin aspart  0-20 Units Subcutaneous Q4H  . DISCONTD: ipratropium  0.5 mg Nebulization Q4H  . DISCONTD: methylPREDNISolone  (SOLU-MEDROL) injection  80 mg Intravenous Q6H  . DISCONTD: vancomycin  750 mg Intravenous Q12H  . DISCONTD: vancomycin  1,000 mg Intravenous Q12H   Assessment: Trough level below goal  Goal of Therapy:  Vancomycin trough level 15-20 mcg/ml  Plan:  Increase Vancomycin to 1000 mg IV every 12 hours. Vancomycin trough Friday am to assess dose increase Labs per protocol  Valrie Hart A 06/30/2011,7:49 AM

## 2011-06-30 NOTE — Evaluation (Signed)
Physical Therapy Evaluation Patient Details Name: Kristin Wells MRN: [id number] DOB: 02-04-1951 Today's Date: 06/30/2011  Problem List:  Patient Active Problem List  Diagnoses  . HYPOKALEMIA  . ANXIETY  . SMOKER  . DEPRESSION  . Other chronic pain  . COPD  . Acute respiratory failure  . COPD exacerbation  . Demand ischemia of myocardium  . Hyperglycemia  . Thyroid enlargement  . Ischemic cardiomyopathy  . MI, acute, non ST segment elevation  . PNA (pneumonia)  . Hypotension    Past Medical History:  Past Medical History  Diagnosis Date  . COPD (chronic obstructive pulmonary disease)   . Asthma   . Bronchitis   . Anxiety   . Back pain   . Demand ischemia of myocardium 06/28/2011  . Thyroid enlargement 06/28/2011  . MI, acute, non ST segment elevation 06/29/2011  . Ischemic cardiomyopathy 06/28/2011    EF 25%  . PNA (pneumonia) 06/29/2011   Past Surgical History:  Past Surgical History  Procedure Date  . Abdominal hysterectomy     PT Assessment/Plan/Recommendation PT Assessment Clinical Impression Statement: pt seen for eval...she is deconditioned for her age, focused on chronic pain and medical issues...deficit     noted in high balance        activities which puts  her at higher risk of fall...she would like HHPT for overall conditioning, balance training...no DME needed...we will follow acutely to maximize overall strength and endurance            PT Recommendation/Assessment: Patient will need skilled PT in the acute care venue PT Problem List: Decreased strength;Decreased activity tolerance;Decreased balance;Decreased mobility;Decreased safety awareness;Cardiopulmonary status limiting activity Barriers to Discharge: Decreased caregiver support PT Therapy Diagnosis : Difficulty walking;Generalized weakness PT Plan PT Frequency: Min 3X/week PT Treatment/Interventions: Gait training;Therapeutic activities;Therapeutic exercise;Balance training;Patient/family  education PT Recommendation Follow Up Recommendations: Home health PT Equipment Recommended: Defer to next venue PT Goals  Acute Rehab PT Goals PT Goal Formulation: With patient Pt will Ambulate: >150 feet;with modified independence;with rolling walker PT Goal: Ambulate - Progress: Goal set today  PT Evaluation Precautions/Restrictions  Precautions Required Braces or Orthoses: No Restrictions Weight Bearing Restrictions: No Prior Functioning  Home Living Lives With: Alone Type of Home: Apartment Home Layout: One level (on 2nd level) Home Access: Elevator Home Adaptive Equipment: Quad cane;Walker - rolling;Shower chair with back;Bedside commode/3-in-1 Prior Function Level of Independence: Independent with basic ADLs;Independent with homemaking with ambulation;Independent with gait;Independent with transfers;Requires assistive device for independence Driving: No Vocation: On disability Cognition Cognition Overall Cognitive Status: Appears within functional limits for tasks assessed Orientation Level: Oriented X4 Sensation/Coordination Sensation Light Touch: Appears Intact Stereognosis: Not tested Hot/Cold: Not tested Proprioception: Appears Intact Coordination Gross Motor Movements are Fluid and Coordinated: Yes Fine Motor Movements are Fluid and Coordinated: Yes Extremity Assessment RUE Assessment RUE Assessment: Within Functional Limits LUE Assessment LUE Assessment: Within Functional Limits RLE Assessment RLE Assessment: Within Functional Limits LLE Assessment LLE Assessment: Within Functional Limits Mobility (including Balance) Bed Mobility Bed Mobility: No Transfers Transfers: Yes Sit to Stand: 6: Modified independent (Device/Increase time) Stand to Sit: 6: Modified independent (Device/Increase time) Ambulation/Gait Ambulation/Gait: Yes Ambulation/Gait Assistance: 5: Supervision Ambulation/Gait Assistance Details (indicate cue type and reason): cues to  increase thoracic extension Ambulation Distance (Feet): 120 Feet Assistive device: Rolling walker Gait Pattern: Within Functional Limits;Trunk flexed Stairs: No Wheelchair Mobility Wheelchair Mobility: No  Posture/Postural Control Posture/Postural Control: No significant limitations Balance Balance Assessed: Yes High Level Balance High Level Balance Activites: Side stepping;Backward  walking;Turns High Level Balance Comments: mild deficit in high balance ability which will put pt at higher risk of fall Exercise    End of Session PT - End of Session Equipment Utilized During Treatment: Gait belt Activity Tolerance: Patient tolerated treatment well Patient left: in chair;with call bell in reach;with family/visitor present Nurse Communication: Mobility status for transfers;Mobility status for ambulation General Behavior During Session: [facility] for tasks performed Cognition: [facility] for tasks performed  Konrad Penta 06/30/2011, 2:30 PM

## 2011-06-30 NOTE — Progress Notes (Addendum)
SUBJECTIVE:Extubated. Breathing much better. No complaints.  Principal Problem:  *Acute respiratory failure Active Problems:  ANXIETY  DEPRESSION  Other chronic pain  COPD exacerbation  Demand ischemia of myocardium  Hyperglycemia  Thyroid enlargement  Ischemic cardiomyopathy  MI, acute, non ST segment elevation  PNA (pneumonia)  Hypotension   LABS: Basic Metabolic Panel:  Basename 06/30/11 0412 06/29/11 0425 06/28/11 0427  NA 141 143 --  K 3.6 3.6 --  CL 106 107 --  CO2 27 26 --  GLUCOSE 132* 128* --  BUN 12 11 --  CREATININE 0.73 0.94 --  CALCIUM 8.8 9.3 --  MG -- -- 2.2  PHOS -- -- --   Liver Function Tests:  Basename 06/29/11 0425  AST 16  ALT 21  ALKPHOS 105  BILITOT 0.2*  PROT 6.4  ALBUMIN 2.8*   CBC:  Basename 06/29/11 0425 06/28/11 0427 06/27/11 1908  WBC 12.8* 11.4* --  NEUTROABS -- -- 7.9*  HGB 12.0 13.2 --  HCT 37.1 42.1 --  MCV 89.2 90.0 --  PLT 256 311 --   Cardiac Enzymes:  Basename 06/28/11 1158 06/28/11 0427 06/27/11 2322  CKTOTAL 112 110 102  CKMB 8.5* 9.0* 7.1*  CKMBINDEX -- -- --  TROPONINI 1.69* 1.95* 1.60*   D-Dimer:  Basename 06/27/11 1908  DDIMER 2.45*   Hemoglobin A1C:  Basename 06/27/11 2322  HGBA1C 5.9*   Fasting Lipid Panel:  Basename 06/30/11 0408  CHOL --  HDL --  LDLCALC --  TRIG 121  CHOLHDL --  LDLDIRECT --   Thyroid Function Tests:  Basename 06/28/11 0842  TSH 1.325  T4TOTAL --  T3FREE --  THYROIDAB --  ECHO: 06/28/2011 Left ventricle: The cavity size was normal. Mild to moderate LVH with disproportionate septal hypertrophy. Systolic function was severely reduced. The estimated ejection fraction was in the range of 25% to 30%. Moderate to severe hypokinesis of the distal anteroseptal myocardium. Apical akinesis to mild dyskinesis. Akinesis of the mid-distalinferior myocardium. - Aortic valve: Mildly calcified annulus. - Right ventricle: The cavity size was mildly decreased. Wall thickness  was mildly increased. - Atrial septum: No defect or patent foramen ovale was identified. - Pericardium, extracardiac: A small pericardial effusion was identified along the right ventricular free wall. The fluid exhibited a fibrinous appearance.There was no evidence of hemodynamic compromise.  RADIOLOGY: Dg Chest Port 1 View  06/29/2011  *RADIOLOGY REPORT*  Clinical Data: Ventilator dependent respiratory failure.  Follow up left basilar atelectasis and/or pneumonia.  PORTABLE CHEST - 1 VIEW 06/29/2011 0525 hours: IMPRESSION: Support apparatus satisfactory.  Improved aeration of the left lung base, with mild atelectasis persisting.  Stable mild changes of chronic bronchitis/asthma.  No new abnormalities.  Original Report Authenticated By: Arnell Sieving, M.D.    PHYSICAL EXAM BP 119/87  Pulse 77  Temp(Src) 98.1 F (36.7 C) (Oral)  Resp 21  Ht 5\' 2"  (1.575 m)  Wt 157 lb 10.1 oz (71.5 kg)  BMI 28.83 kg/m2  SpO2 96% General: Well developed, well nourished, in no acute distress Head: Eyes PERRLA, No xanthomas.   Normal cephalic and atramatic  Lungs: Bilateral crackles in the lower lobes. No wheezes. Heart: HRRR S1 S2, No MRG Pulses are 2+ & equal.            No carotid bruit. No JVD.  No abdominal bruits. No femoral bruits. Abdomen: Bowel sounds are positive, abdomen soft and non-tender without masses or  Hernia's noted. Msk:  Back normal, normal gait. Normal strength and tone for  age. Extremities: Positive for clubbing, No cyanosis or edema.  DP +1 Neuro: Alert and oriented X 3. Psych:  Good affect, responds appropriately  TELEMETRY: Reviewed telemetry pt in NSR rate in the 70's.  ASSESSMENT AND PLAN:  1. NSTEMI: Type II. Elevated troponins most likely related to hypoxia in the setting of hypercarbic respiratory failure. She is now extubated, breathing much better and without complaint. She has now been placed on ACE inhibitor.  2. Severe systolic dysfunction: Echocardiogram  reveals EF of 25-30%.Moderate to severe hypokinesis of the distal anteroseptal myocardium. Apical akinesis to mild dyskinesis. Akinesis of the mid-distalinferior myocardium. Now that she is extubated may need to consider cardiac catheterization vs. stress Myoview for evaluation of ischemia causing systolic dysfunction. She has cardiovascular risk factors of smoking, diabetes, thyroid disease.  Will discuss further with Dr. Diona Browner. She is unable to be placed on beta blocker secondary to COPD. Will need to be placed on a low-dose diuretic to assist with preload and afterload reduction. We will begin Lasix 20 mg by mouth daily. Monitor renal function.  Bettey Mare. Lyman Bishop NP [facility] 06/30/2011, 8:32 AM   Attending note:  Patient seen and examined. Reviewed database as recorded by Ms. Lawrence. She was extubated yesterday, up in a chair this morning breathing comfortably. She denies any chest pain. We discussed her history and presentation. She denies having any chest pain leading up to the present episode, mainly focuses on significant psychosocial stressors affecting her life at this time. She also reports "panic attacks" that are triggered by stress and bad memories, resulting in significant shortness of breath.  On examination she is comfortable, afebrile, heart rates in the 80 to 100 range in sinus rhythm, occasional PVCs on telemetry, blood pressure 126/91. Examination of the neck reveals no elevated JVP, lungs exhibit coarse breath sounds, diminished overall, with soft end expiratory wheeze. Cardiac exam with indistinct PMI, regular rate and rhythm without S3.  Followup lab work shows potassium 3.6, BUN 12, creatinine 0.7, proBNP 5949. ECG shows sinus rhythm with diffuse T-wave inversion, prolonged QT, low voltage.   Her current medications include aspirin, Vasotec, Lovenox, Lasix, insulin, nebulizer treatments, and antibiotics. Lipid panel is needed. Will also empirically start  statin, and add Plavix in light of her NSTEMI. Would generally recommend further stabilization on medical therapy, and depending on how she does, we might be able to try to introduce very low-dose Coreg. Next step will be determining ischemic testing, as she almost certainly has underlying multivessel atherosclerosis to some degree. It might be best to pursue a Myoview first to determine actual burden of active ischemia versus scar, prior to determining whether invasive catheterization is warranted. We are following with you.  Jonelle Sidle, M.D., F.A.C.C.

## 2011-07-01 DIAGNOSIS — F17200 Nicotine dependence, unspecified, uncomplicated: Secondary | ICD-10-CM

## 2011-07-01 DIAGNOSIS — J449 Chronic obstructive pulmonary disease, unspecified: Secondary | ICD-10-CM

## 2011-07-01 LAB — LIPID PANEL
Cholesterol: 206 mg/dL — ABNORMAL HIGH (ref 0–200)
HDL: 34 mg/dL — ABNORMAL LOW (ref >39)
LDL Cholesterol: 145 mg/dL — ABNORMAL HIGH (ref 0–99)
Total CHOL/HDL Ratio: 6.1 RATIO
Triglycerides: 134 mg/dL (ref <150)
VLDL: 27 mg/dL (ref 0–40)

## 2011-07-01 LAB — BASIC METABOLIC PANEL
BUN: 12 mg/dL (ref 6–23)
CO2: 31 mEq/L (ref 19–32)
Calcium: 9.3 mg/dL (ref 8.4–10.5)
Chloride: 105 mEq/L (ref 96–112)
Creatinine, Ser: 0.76 mg/dL (ref 0.50–1.10)
GFR calc Af Amer: >90 mL/min (ref >90)
GFR calc non Af Amer: >90 mL/min (ref >90)
Glucose, Bld: 124 mg/dL — ABNORMAL HIGH (ref 70–99)
Potassium: 4.2 mEq/L (ref 3.5–5.1)
Sodium: 142 mEq/L (ref 135–145)

## 2011-07-01 LAB — CBC
HCT: 37.1 % (ref 36.0–46.0)
Hemoglobin: 12.1 g/dL (ref 12.0–15.0)
MCH: 28.9 pg (ref 26.0–34.0)
MCHC: 32.6 g/dL (ref 30.0–36.0)
MCV: 88.5 fL (ref 78.0–100.0)
Platelets: 200 10*3/uL (ref 150–400)
RBC: 4.19 MIL/uL (ref 3.87–5.11)
RDW: 15.1 % (ref 11.5–15.5)
WBC: 6.5 10*3/uL (ref 4.0–10.5)

## 2011-07-01 LAB — GLUCOSE, CAPILLARY
Glucose-Capillary: 272 mg/dL — ABNORMAL HIGH (ref 70–99)
Glucose-Capillary: 123 mg/dL — ABNORMAL HIGH (ref 70–99)
Glucose-Capillary: 112 mg/dL — ABNORMAL HIGH (ref 70–99)

## 2011-07-01 LAB — VANCOMYCIN, TROUGH: Vancomycin Tr: 17.9 ug/mL (ref 10.0–20.0)

## 2011-07-01 MED ORDER — REGADENOSON 0.4 MG/5ML IV SOLN
0.4000 mg | Freq: Once | INTRAVENOUS | Status: DC
  Filled 2011-07-01: qty 5

## 2011-07-01 MED ORDER — CARVEDILOL 3.125 MG PO TABS
3.1250 mg | ORAL_TABLET | Freq: Two times a day (BID) | ORAL | Status: DC
  Administered 2011-07-01 – 2011-07-03 (×6): 3.125 mg via ORAL
  Filled 2011-07-01 (×6): qty 1

## 2011-07-01 MED ORDER — PREDNISONE 20 MG PO TABS
30.0000 mg | ORAL_TABLET | Freq: Every day | ORAL | Status: DC
  Administered 2011-07-02: 30 mg via ORAL
  Filled 2011-07-01: qty 1

## 2011-07-01 MED ORDER — LACTULOSE 10 GM/15ML PO SOLN
30.0000 g | Freq: Three times a day (TID) | ORAL | Status: DC
  Administered 2011-07-01 – 2011-07-03 (×5): 30 g via ORAL
  Filled 2011-07-01 (×3): qty 60
  Filled 2011-07-01 (×2): qty 30
  Filled 2011-07-01: qty 60

## 2011-07-01 MED ORDER — SENNOSIDES-DOCUSATE SODIUM 8.6-50 MG PO TABS: 1.0000 | ORAL_TABLET | Freq: Every evening | ORAL | Status: DC | PRN

## 2011-07-01 MED ORDER — NICOTINE 14 MG/24HR TD PT24
14.0000 mg | MEDICATED_PATCH | Freq: Every day | TRANSDERMAL | Status: DC
  Administered 2011-07-01 – 2011-07-03 (×3): 14 mg via TRANSDERMAL
  Filled 2011-07-01 (×4): qty 1

## 2011-07-01 MED ORDER — FLEET ENEMA 7-19 GM/118ML RE ENEM
1.0000 | ENEMA | Freq: Once | RECTAL | Status: AC
  Administered 2011-07-01: 1 via RECTAL

## 2011-07-01 MED ORDER — SODIUM CHLORIDE 0.9 % IJ SOLN
INTRAMUSCULAR | Status: AC
  Administered 2011-07-01: 10 mL
  Filled 2011-07-01: qty 3

## 2011-07-01 MED ORDER — SENNOSIDES-DOCUSATE SODIUM 8.6-50 MG PO TABS
2.0000 | ORAL_TABLET | Freq: Every day | ORAL | Status: DC
  Administered 2011-07-01 – 2011-07-04 (×4): 2 via ORAL
  Filled 2011-07-01 (×4): qty 2

## 2011-07-01 NOTE — Progress Notes (Signed)
Patient given MOM, miralax, and fleets emema this AM.  Patient passed gas and a very small amount of soft stool.

## 2011-07-01 NOTE — Progress Notes (Addendum)
Subjective: No shortness of breath or chest pain. Complains of constipation.  Objective: Vital signs in last 24 hours: Filed Vitals:   07/01/11 0734 07/01/11 1144 07/01/11 1404 07/01/11 1529  BP:   103/70   Pulse:   88   Temp:   97.3 F (36.3 C)   TempSrc:   Oral   Resp:   18   Height:      Weight:      SpO2: 95% 96% 94% 96%   Weight change: -3.2 kg (-7 lb 0.9 oz)  Intake/Output Summary (Last 24 hours) at 07/01/11 1556 Last data filed at 07/01/11 1500  Gross per 24 hour  Intake   1130 ml  Output   2700 ml  Net  -1570 ml   Physical Exam: Gen.: On,. Alert and oriented. Lungs clear to auscultation bilaterally without wheezes rhonchi or rales Cardiovascular regular rate and rhythm without murmurs gallops rubs Abdomen soft nontender nondistended Extremities no clubbing cyanosis or edema Psychiatric: Normal affect. Comment cooperative.  Lab Results: Basic Metabolic Panel:  Lab 07/01/11 4098 06/30/11 0412 06/28/11 0427  NA 142 141 --  K 4.2 3.6 --  CL 105 106 --  CO2 31 27 --  GLUCOSE 124* 132* --  BUN 12 12 --  CREATININE 0.76 0.73 --  CALCIUM 9.3 8.8 --  MG -- -- 2.2  PHOS -- -- --   Liver Function Tests:  Lab 06/29/11 0425  AST 16  ALT 21  ALKPHOS 105  BILITOT 0.2*  PROT 6.4  ALBUMIN 2.8*   No results found for this basename: LIPASE:2,AMYLASE:2 in the last 168 hours No results found for this basename: AMMONIA:2 in the last 168 hours CBC:  Lab 07/01/11 0533 06/29/11 0425 06/27/11 1908  WBC 6.5 12.8* --  NEUTROABS -- -- 7.9*  HGB 12.1 12.0 --  HCT 37.1 37.1 --  MCV 88.5 89.2 --  PLT 200 256 --   Cardiac Enzymes:  Lab 06/28/11 1158 06/28/11 0427 06/27/11 2322  CKTOTAL 112 110 102  CKMB 8.5* 9.0* 7.1*  CKMBINDEX -- -- --  TROPONINI 1.69* 1.95* 1.60*   BNP:  Lab 06/30/11 0910 06/27/11 1843  PROBNP 5949.0* 373.8*   D-Dimer:  Lab 06/27/11 1908  DDIMER 2.45*   CBG:  Lab 07/01/11 1107 06/30/11 1623 06/30/11 1140 06/30/11 0730 06/29/11  2217 06/29/11 1944  GLUCAP 272* 154* 142* 113* 157* 245*   Hemoglobin A1C:  Lab 06/27/11 2322  HGBA1C 5.9*   Fasting Lipid Panel:  Lab 07/01/11 0534  CHOL 206*  HDL 34*  LDLCALC 145*  TRIG 134  CHOLHDL 6.1  LDLDIRECT --   Thyroid Function Tests:  Lab 06/28/11 0842  TSH 1.325  T4TOTAL --  FREET4 1.00  T3FREE --  THYROIDAB --   Coagulation: No results found for this basename: LABPROT:4,INR:4 in the last 168 hours Anemia Panel: No results found for this basename: VITAMINB12,FOLATE,FERRITIN,TIBC,IRON,RETICCTPCT in the last 168 hours Urine Drug Screen: Drugs of Abuse     Component Value Date/Time   LABOPIA NONE DETECTED 11/24/2006 1355   COCAINSCRNUR POSITIVE* 11/24/2006 1355   LABBENZ NONE DETECTED 11/24/2006 1355   AMPHETMU NONE DETECTED 11/24/2006 1355   THCU NONE DETECTED 11/24/2006 1355   LABBARB  Value: NONE DETECTED        DRUG SCREEN FOR MEDICAL PURPOSES ONLY.  IF CONFIRMATION IS NEEDED FOR ANY PURPOSE, NOTIFY LAB WITHIN 5 DAYS. 11/24/2006 1355    Alcohol Level: No results found for this basename: ETH:2 in the last 168 hours  Urinalysis:  Lab 06/27/11 1853  COLORURINE YELLOW  LABSPEC 1.015  PHURINE 6.0  GLUCOSEU NEGATIVE  HGBUR SMALL*  BILIRUBINUR NEGATIVE  KETONESUR NEGATIVE  PROTEINUR 100*  UROBILINOGEN 0.2  NITRITE NEGATIVE  LEUKOCYTESUR NEGATIVE    Micro Results: Recent Results (from the past 240 hour(s))  CULTURE, BLOOD (ROUTINE X 2)     Status: Normal (Preliminary result)   Collection Time   06/27/11  6:20 PM      Component Value Range Status Comment   Specimen Description BLOOD RIGHT HAND   Final    Special Requests BOTTLES DRAWN AEROBIC AND ANAEROBIC 6CC   Final    Culture NO GROWTH 4 DAYS   Final    Report Status PENDING   Incomplete   CULTURE, BLOOD (ROUTINE X 2)     Status: Normal (Preliminary result)   Collection Time   06/27/11  6:40 PM      Component Value Range Status Comment   Specimen Description BLOOD RIGHT ANTECUBITAL   Final     Special Requests BOTTLES DRAWN AEROBIC AND ANAEROBIC 8CC   Final    Culture NO GROWTH 4 DAYS   Final    Report Status PENDING   Incomplete   URINE CULTURE     Status: Normal   Collection Time   06/27/11  6:53 PM      Component Value Range Status Comment   Specimen Description URINE, CATHETERIZED   Final    Special Requests NONE   Final    Culture  Setup Time [id number]   Final    Colony Count NO GROWTH   Final    Culture NO GROWTH   Final    Report Status 06/29/2011 FINAL   Final   MRSA PCR SCREENING     Status: Normal   Collection Time   06/27/11 11:42 PM      Component Value Range Status Comment   MRSA by PCR NEGATIVE  NEGATIVE  Final    Studies/Results: No results found. Scheduled Meds:    . albuterol  2.5 mg Nebulization QID   And  . ipratropium  0.5 mg Nebulization QID  . aspirin  325 mg Per NG tube Daily  . carvedilol  3.125 mg Oral BID WC  . clopidogrel  75 mg Oral Q breakfast  . enalapril  2.5 mg Oral Daily  . enoxaparin (LOVENOX) injection  40 mg Subcutaneous Q24H  . Fluticasone-Salmeterol  1 puff Inhalation Q12H  . furosemide  20 mg Oral Daily  . gabapentin  300 mg Oral BID  . insulin aspart  0-20 Units Subcutaneous TID WC  . insulin aspart  0-5 Units Subcutaneous QHS  . insulin glargine  10 Units Subcutaneous BID  . lactulose  30 g Oral TID  . moxifloxacin  400 mg Oral q1800  . nicotine  14 mg Transdermal Daily  . pantoprazole  40 mg Oral Q1200  . PARoxetine  30 mg Oral Daily  . polyethylene glycol  17 g Oral Daily  . predniSONE  30 mg Oral Daily  . regadenoson  0.4 mg Intravenous Once  . rosuvastatin  10 mg Oral q1800  . senna-docusate  2 tablet Oral QHS  . sodium chloride      . sodium phosphate  1 enema Rectal Once  . traZODone  100 mg Oral QHS  . DISCONTD: magnesium hydroxide  30 mL Oral Daily  . DISCONTD: predniSONE  40 mg Oral BID  . DISCONTD: vancomycin  1,000 mg Intravenous Q12H  Continuous Infusions:  PRN Meds:.albuterol, ALPRAZolam,  oxyCODONE-acetaminophen, zolpidem, DISCONTD: senna-docusate Assessment/Plan: Principal Problem:  *Acute respiratory failure, extubated Active Problems:  COPD exacerbation  Ischemic cardiomyopathy  MI, acute, non ST segment elevation  PNA (pneumonia)  Demand ischemia of myocardium  ANXIETY  DEPRESSION  Other chronic pain  Hyperglycemia, steroid induced  Thyroid enlargement  Hypotension, resolved Gastroesophageal reflux disease  Discontinue vancomycin. Taper steroids. Stress test Monday. Laxatives.   LOS: 4 days   Kristin Wells 07/01/2011, 3:56 PM

## 2011-07-01 NOTE — Progress Notes (Signed)
Physical Therapy Treatment Patient Details Name: Kristin Wells MRN: [id number] DOB: [birth year] Today's Date: 07/01/2011  PT Assessment/Plan  PT - Assessment/Plan Comments on Treatment Session: pt tolerating PT well with addition of general strengthening exercise...will add on balance exercise at nest visit PT Goals  Acute Rehab PT Goals PT Goal: Ambulate - Progress: Met Pt will Perform Home Exercise Program: with supervision, verbal cues required/provided PT Goal: Perform Home Exercise Program - Progress: Goal set today  PT Treatment Precautions/Restrictions  Precautions Required Braces or Orthoses: No Restrictions Weight Bearing Restrictions: No Mobility (including Balance) Transfers Sit to Stand: 6: Modified independent (Device/Increase time) Stand to Sit: 6: Modified independent (Device/Increase time) Ambulation/Gait Ambulation/Gait Assistance: 6: Modified independent (Device/Increase time) Ambulation/Gait Assistance Details (indicate cue type and reason): only one verbal cue needed today to increase thoracic extension Ambulation Distance (Feet): 300 Feet Assistive device: Rolling walker Gait Pattern: Within Functional Limits    Exercise  General Exercises - Lower Extremity Ankle Circles/Pumps: AROM;Both;10 reps;Supine Heel Slides: AROM;Both;10 reps;Supine Straight Leg Raises: AROM;Both;10 reps;Supine Other Exercises Other Exercises: trunk rolling x5 Other Exercises: bridging x5 End of Session PT - End of Session Equipment Utilized During Treatment: Gait belt Activity Tolerance: Patient tolerated treatment well Patient left: in bed;with call bell in reach;with bed alarm set;with family/visitor present Nurse Communication: Mobility status for transfers;Mobility status for ambulation General Behavior During Session: [facility] for tasks performed Cognition: [facility] for tasks performed  Konrad Penta 07/01/2011, 10:28 AM

## 2011-07-01 NOTE — Progress Notes (Signed)
Slept well all night after receiving sleeping pills. Boyfriend at bedside all night. Ambulated in halls earlier at shift change with assist of one. Discussed Acute MI. Uneventful shift. No changes from PM assessment.

## 2011-07-01 NOTE — Progress Notes (Signed)
ANTIBIOTIC CONSULT NOTE -   Pharmacy Consult for  Vancomycin Indication: rule out pneumonia  No Known Allergies  Patient Measurements: Height: 5\' 2"  (157.5 cm) Weight: 150 lb 9.2 oz (68.3 kg) IBW/kg (Calculated) : 50.1  Adjusted Body Weight: 67 kg  Vital Signs: Temp: 98.6 F (37 C) (02/22 0520) Temp src: Oral (02/22 0520) BP: 119/72 mmHg (02/22 0520) Pulse Rate: 72  (02/22 0520) Intake/Output from previous day: 02/21 0701 - 02/22 0700 In: 2290 [P.O.:1680; I.V.:210; IV Piggyback:400] Out: 2900 [Urine:2900] Intake/Output from this shift: Total I/O In: 380 [P.O.:380] Out: 800 [Urine:800]  Labs:  RECENT VANCOMYCIN TROUGH LEVELS: Recent Labs  Basename 07/01/11 1050 06/29/11 0908   VANCOTROUGH 17.9 11.3    Basename 07/01/11 0533 06/30/11 0412 06/29/11 0425  WBC 6.5 -- 12.8*  HGB 12.1 -- 12.0  PLT 200 -- 256  LABCREA -- -- --  CREATININE 0.76 0.73 0.94   Estimated Creatinine Clearance: 68.6 ml/min (by C-G formula based on Cr of 0.76).    Microbiology: Recent Results (from the past 720 hour(s))  CULTURE, BLOOD (ROUTINE X 2)     Status: Normal (Preliminary result)   Collection Time   06/27/11  6:20 PM      Component Value Range Status Comment   Specimen Description BLOOD RIGHT HAND   Final    Special Requests BOTTLES DRAWN AEROBIC AND ANAEROBIC 6CC   Final    Culture NO GROWTH 3 DAYS   Final    Report Status PENDING   Incomplete   CULTURE, BLOOD (ROUTINE X 2)     Status: Normal (Preliminary result)   Collection Time   06/27/11  6:40 PM      Component Value Range Status Comment   Specimen Description BLOOD RIGHT ANTECUBITAL   Final    Special Requests BOTTLES DRAWN AEROBIC AND ANAEROBIC 8CC   Final    Culture NO GROWTH 3 DAYS   Final    Report Status PENDING   Incomplete   URINE CULTURE     Status: Normal   Collection Time   06/27/11  6:53 PM      Component Value Range Status Comment   Specimen Description URINE, CATHETERIZED   Final    Special Requests NONE    Final    Culture  Setup Time [id number]   Final    Colony Count NO GROWTH   Final    Culture NO GROWTH   Final    Report Status 06/29/2011 FINAL   Final   MRSA PCR SCREENING     Status: Normal   Collection Time   06/27/11 11:42 PM      Component Value Range Status Comment   MRSA by PCR NEGATIVE  NEGATIVE  Final     Medical History: Past Medical History  Diagnosis Date  . COPD (chronic obstructive pulmonary disease)   . Asthma   . Bronchitis   . Anxiety   . Back pain   . Demand ischemia of myocardium 06/28/2011  . Thyroid enlargement 06/28/2011  . MI, acute, non ST segment elevation 06/29/2011  . Ischemic cardiomyopathy 06/28/2011    EF 25%  . PNA (pneumonia) 06/29/2011    Medications:  Scheduled:     . albuterol  2.5 mg Nebulization QID   And  . ipratropium  0.5 mg Nebulization QID  . aspirin  325 mg Per NG tube Daily  . carvedilol  3.125 mg Oral BID WC  . clopidogrel  75 mg Oral Q breakfast  . enalapril  2.5 mg Oral Daily  . enoxaparin (LOVENOX) injection  40 mg Subcutaneous Q24H  . Fluticasone-Salmeterol  1 puff Inhalation Q12H  . furosemide  20 mg Oral Daily  . gabapentin  300 mg Oral BID  . insulin aspart  0-20 Units Subcutaneous TID WC  . insulin aspart  0-5 Units Subcutaneous QHS  . insulin glargine  10 Units Subcutaneous BID  . moxifloxacin  400 mg Oral q1800  . pantoprazole  40 mg Oral Q1200  . PARoxetine  30 mg Oral Daily  . polyethylene glycol  17 g Oral Daily  . predniSONE  40 mg Oral BID  . regadenoson  0.4 mg Intravenous Once  . rosuvastatin  10 mg Oral q1800  . sodium chloride      . sodium phosphate  1 enema Rectal Once  . traZODone  100 mg Oral QHS  . vancomycin  1,000 mg Intravenous Q12H  . DISCONTD: magnesium hydroxide  30 mL Oral Daily  . DISCONTD: polyethylene glycol powder  17 g Oral Daily   Assessment: Trough at goal.  Goal of Therapy:  Vancomycin trough level 15-20 mcg/ml  Plan:  Continue Vancomycin to 1000 mg IV every 12  hours. Labs per protocol  Caryl Asp 07/01/2011,12:16 PM

## 2011-07-01 NOTE — Progress Notes (Signed)
Present with patient as listening and supportive presence.  Discussed support issues when she leaves the hospital.  Prayed with her.

## 2011-07-01 NOTE — Progress Notes (Addendum)
SUBJECTIVE: Complains of constipation. No chest pain, no significant shortness of breath. Has been walking in the room.  Principal Problem:  *Acute respiratory failure Active Problems:  ANXIETY  DEPRESSION  Other chronic pain  COPD exacerbation  Demand ischemia of myocardium  Hyperglycemia  Thyroid enlargement  Ischemic cardiomyopathy  MI, acute, non ST segment elevation  PNA (pneumonia)  Hypotension   LABS: Basic Metabolic Panel:  Basename 07/01/11 0533 06/30/11 0412  NA 142 141  K 4.2 3.6  CL 105 106  CO2 31 27  GLUCOSE 124* 132*  BUN 12 12  CREATININE 0.76 0.73  CALCIUM 9.3 8.8  MG -- --  PHOS -- --   Liver Function Tests:  Basename 06/29/11 0425  AST 16  ALT 21  ALKPHOS 105  BILITOT 0.2*  PROT 6.4  ALBUMIN 2.8*   No results found for this basename: LIPASE:2,AMYLASE:2 in the last 72 hours CBC:  Basename 07/01/11 0533 06/29/11 0425  WBC 6.5 12.8*  NEUTROABS -- --  HGB 12.1 12.0  HCT 37.1 37.1  MCV 88.5 89.2  PLT 200 256   Cardiac Enzymes:  Basename 06/28/11 1158  CKTOTAL 112  CKMB 8.5*  CKMBINDEX --  TROPONINI 1.69*   Fasting Lipid Panel:  Basename 07/01/11 0534  CHOL 206*  HDL 34*  LDLCALC 145*  TRIG 134  CHOLHDL 6.1  LDLDIRECT --   PHYSICAL EXAM BP 119/72  Pulse 72  Temp(Src) 98.6 F (37 C) (Oral)  Resp 20  Ht 5\' 2"  (1.575 m)  Wt 150 lb 9.2 oz (68.3 kg)  BMI 27.54 kg/m2  SpO2 95% General: Well developed, well nourished, in no acute distress Head: Eyes PERRLA, No xanthomas.   Normal cephalic and atramatic  Lungs: Clear bilaterally with prolonged expiratory phase. Heart: HRRR S1 S2, No MRG .Pulses are 2+ & equal. No carotid bruit. No JVD.  No abdominal bruits. No femoral bruits. Abdomen: Bowel sounds are positive, abdomen soft and non-tender without masses or  Hernia's noted. Msk:  Back normal, normal gait. Normal strength and tone for age. Extremities: No clubbing, cyanosis or edema.  DP +1 Neuro: Alert and oriented X  3. Psych:  Good affect, responds appropriately  TELEMETRY: Reviewed telemetry pt in: SR 80's-90's.   ASSESSMENT AND PLAN:  1. Severe systolic dysfunction: EF of 25-30%. She has moderate to severe hypokinesis of the distal anterior septal myocardium and apical akinesis to mild  Dyskinesis. She is akinesis of the mid distal inferior myocardium. She is breathing much better. Has been up moving around without complaints of significant shortness of breath. Is slowly having her energy returned. I do not hear any wheezing on auscultation. We will begin low-dose Coreg 3.125 mg twice a day and monitor heart rate and blood pressure response. Currently, blood pressure is low normal at 119/72. She has been mostly sedentary. We will increase activity and evaluate how she does. She will need to be evaluated with a nuclear medicine Myoview at some point. I think in the next couple days she will be ready to proceed with this. If she is still in the hospital, we will plan for Monday a.m.  2. NSTEMI: She is stable at this time. There are no arrhythmias or recurrent chest discomfort. She remains on ACE inhibitor and as, above. We will introduce low-dose Coreg. We will follow.  3. Hypercholesterolemia: She has been started on statin. TC:206; LDL 145.   Bettey Mare. Lyman Bishop NP [facility] 07/01/2011, 9:01 AM   Attending note:  Patient  now out on the telemetry unit. She is up some in her room, her respiratory status has been stable overall. No chest pain.  Blood pressure 119/72, heart rate ranging 70s to 100s in sinus rhythm by telemetry. No major change in examination.  Followup lab work shows potassium 4.2, creatinine 0.7, LDL 145, total cholesterol 206, triglycerides 134, HDL 34, hemoglobin 12.1, platelets 200. Repeat ECG with fairly diffuse inferolateral T wave inversions and QT prolongation as before.  Current cardiac medical regimen now includes aspirin, recent addition of low-dose Coreg, Plavix,  enalapril, Lovenox, Lasix, and Crestor. Would observe her over the weekend, titrating medications as necessary, and plan on proceeding with a Lexiscan Myoview on Monday. If she has significant residual ischemic burden, then a cardiac catheterization can be considered. Otherwise if her Myoview shows largely scar, then medical therapy might be the most appropriate option for now.  Jonelle Sidle, M.D., F.A.C.C.

## 2011-07-02 LAB — CULTURE, BLOOD (ROUTINE X 2)
Culture: NO GROWTH
Culture: NO GROWTH

## 2011-07-02 LAB — GLUCOSE, CAPILLARY
Glucose-Capillary: 162 mg/dL — ABNORMAL HIGH (ref 70–99)
Glucose-Capillary: 92 mg/dL (ref 70–99)
Glucose-Capillary: 152 mg/dL — ABNORMAL HIGH (ref 70–99)

## 2011-07-02 MED ORDER — SODIUM CHLORIDE 0.9 % IJ SOLN
INTRAMUSCULAR | Status: AC
  Filled 2011-07-02: qty 3

## 2011-07-02 MED ORDER — PREDNISONE 20 MG PO TABS
20.0000 mg | ORAL_TABLET | Freq: Every day | ORAL | Status: DC
  Administered 2011-07-03: 20 mg via ORAL
  Filled 2011-07-02: qty 1

## 2011-07-02 NOTE — Progress Notes (Signed)
On commode  Subjective: Nurses report several BM  Objective: Vital signs in last 24 hours: Filed Vitals:   07/02/11 0443 07/02/11 0451 07/02/11 0859 07/02/11 0912  BP: 120/76     Pulse: 71     Temp: 97.5 F (36.4 C)     TempSrc: Oral     Resp: 18     Height:      Weight:  67.4 kg (148 lb 9.4 oz)    SpO2: 91%  95% 98%   Weight change: -0.9 kg (-1 lb 15.8 oz)  Intake/Output Summary (Last 24 hours) at 07/02/11 1300 Last data filed at 07/02/11 1250  Gross per 24 hour  Intake    790 ml  Output   3825 ml  Net  -3035 ml   Physical Exam: Did not examine. On commode  Lab Results: Basic Metabolic Panel:  Lab 07/01/11 9562 06/30/11 0412 06/28/11 0427  NA 142 141 --  K 4.2 3.6 --  CL 105 106 --  CO2 31 27 --  GLUCOSE 124* 132* --  BUN 12 12 --  CREATININE 0.76 0.73 --  CALCIUM 9.3 8.8 --  MG -- -- 2.2  PHOS -- -- --   Liver Function Tests:  Lab 06/29/11 0425  AST 16  ALT 21  ALKPHOS 105  BILITOT 0.2*  PROT 6.4  ALBUMIN 2.8*   No results found for this basename: LIPASE:2,AMYLASE:2 in the last 168 hours No results found for this basename: AMMONIA:2 in the last 168 hours CBC:  Lab 07/01/11 0533 06/29/11 0425 06/27/11 1908  WBC 6.5 12.8* --  NEUTROABS -- -- 7.9*  HGB 12.1 12.0 --  HCT 37.1 37.1 --  MCV 88.5 89.2 --  PLT 200 256 --   Cardiac Enzymes:  Lab 06/28/11 1158 06/28/11 0427 06/27/11 2322  CKTOTAL 112 110 102  CKMB 8.5* 9.0* 7.1*  CKMBINDEX -- -- --  TROPONINI 1.69* 1.95* 1.60*   BNP:  Lab 06/30/11 0910 06/27/11 1843  PROBNP 5949.0* 373.8*   D-Dimer:  Lab 06/27/11 1908  DDIMER 2.45*   CBG:  Lab 07/02/11 1141 07/02/11 0815 07/01/11 2047 07/01/11 1642 07/01/11 1107 06/30/11 1623  GLUCAP 92 162* 112* 123* 272* 154*   Hemoglobin A1C:  Lab 06/27/11 2322  HGBA1C 5.9*   Fasting Lipid Panel:  Lab 07/01/11 0534  CHOL 206*  HDL 34*  LDLCALC 145*  TRIG 134  CHOLHDL 6.1  LDLDIRECT --   Thyroid Function Tests:  Lab 06/28/11 0842    TSH 1.325  T4TOTAL --  FREET4 1.00  T3FREE --  THYROIDAB --   Coagulation: No results found for this basename: LABPROT:4,INR:4 in the last 168 hours Anemia Panel: No results found for this basename: VITAMINB12,FOLATE,FERRITIN,TIBC,IRON,RETICCTPCT in the last 168 hours Urine Drug Screen: Drugs of Abuse     Component Value Date/Time   LABOPIA NONE DETECTED 11/24/2006 1355   COCAINSCRNUR POSITIVE* 11/24/2006 1355   LABBENZ NONE DETECTED 11/24/2006 1355   AMPHETMU NONE DETECTED 11/24/2006 1355   THCU NONE DETECTED 11/24/2006 1355   LABBARB  Value: NONE DETECTED        DRUG SCREEN FOR MEDICAL PURPOSES ONLY.  IF CONFIRMATION IS NEEDED FOR ANY PURPOSE, NOTIFY LAB WITHIN 5 DAYS. 11/24/2006 1355    Alcohol Level: No results found for this basename: ETH:2 in the last 168 hours Urinalysis:  Lab 06/27/11 1853  COLORURINE YELLOW  LABSPEC 1.015  PHURINE 6.0  GLUCOSEU NEGATIVE  HGBUR SMALL*  BILIRUBINUR NEGATIVE  KETONESUR NEGATIVE  PROTEINUR 100*  UROBILINOGEN  0.2  NITRITE NEGATIVE  LEUKOCYTESUR NEGATIVE    Micro Results: Recent Results (from the past 240 hour(s))  CULTURE, BLOOD (ROUTINE X 2)     Status: Normal   Collection Time   06/27/11  6:20 PM      Component Value Range Status Comment   Specimen Description BLOOD RIGHT HAND   Final    Special Requests BOTTLES DRAWN AEROBIC AND ANAEROBIC 6CC   Final    Culture NO GROWTH 5 DAYS   Final    Report Status 07/02/2011 FINAL   Final   CULTURE, BLOOD (ROUTINE X 2)     Status: Normal   Collection Time   06/27/11  6:40 PM      Component Value Range Status Comment   Specimen Description BLOOD RIGHT ANTECUBITAL   Final    Special Requests BOTTLES DRAWN AEROBIC AND ANAEROBIC 8CC   Final    Culture NO GROWTH 5 DAYS   Final    Report Status 07/02/2011 FINAL   Final   URINE CULTURE     Status: Normal   Collection Time   06/27/11  6:53 PM      Component Value Range Status Comment   Specimen Description URINE, CATHETERIZED   Final     Special Requests NONE   Final    Culture  Setup Time [id number]   Final    Colony Count NO GROWTH   Final    Culture NO GROWTH   Final    Report Status 06/29/2011 FINAL   Final   MRSA PCR SCREENING     Status: Normal   Collection Time   06/27/11 11:42 PM      Component Value Range Status Comment   MRSA by PCR NEGATIVE  NEGATIVE  Final    Studies/Results: No results found. Scheduled Meds:    . albuterol  2.5 mg Nebulization QID   And  . ipratropium  0.5 mg Nebulization QID  . aspirin  325 mg Per NG tube Daily  . carvedilol  3.125 mg Oral BID WC  . clopidogrel  75 mg Oral Q breakfast  . enalapril  2.5 mg Oral Daily  . enoxaparin (LOVENOX) injection  40 mg Subcutaneous Q24H  . Fluticasone-Salmeterol  1 puff Inhalation Q12H  . furosemide  20 mg Oral Daily  . gabapentin  300 mg Oral BID  . insulin aspart  0-20 Units Subcutaneous TID WC  . insulin aspart  0-5 Units Subcutaneous QHS  . lactulose  30 g Oral TID  . moxifloxacin  400 mg Oral q1800  . nicotine  14 mg Transdermal Daily  . pantoprazole  40 mg Oral Q1200  . PARoxetine  30 mg Oral Daily  . polyethylene glycol  17 g Oral Daily  . predniSONE  30 mg Oral Daily  . regadenoson  0.4 mg Intravenous Once  . rosuvastatin  10 mg Oral q1800  . senna-docusate  2 tablet Oral QHS  . sodium chloride      . traZODone  100 mg Oral QHS  . DISCONTD: insulin glargine  10 Units Subcutaneous BID  . DISCONTD: predniSONE  40 mg Oral BID  . DISCONTD: vancomycin  1,000 mg Intravenous Q12H   Continuous Infusions:  PRN Meds:.albuterol, ALPRAZolam, oxyCODONE-acetaminophen, zolpidem, DISCONTD: senna-docusate Assessment/Plan: Principal Problem:  *Acute respiratory failure, extubated Active Problems:  COPD exacerbation  Ischemic cardiomyopathy  MI, acute, non ST segment elevation  PNA (pneumonia)  Demand ischemia of myocardium  ANXIETY  DEPRESSION  Other chronic pain  Hyperglycemia,  steroid induced  Thyroid enlargement  Hypotension,  resolved Gastroesophageal reflux disease  Steroid taper. Constipation improved. Will return to examine   LOS: 5 days   Masaji Billups L 07/02/2011, 1:00 PM

## 2011-07-03 LAB — GLUCOSE, CAPILLARY
Glucose-Capillary: 135 mg/dL — ABNORMAL HIGH (ref 70–99)
Glucose-Capillary: 81 mg/dL (ref 70–99)
Glucose-Capillary: 123 mg/dL — ABNORMAL HIGH (ref 70–99)
Glucose-Capillary: 115 mg/dL — ABNORMAL HIGH (ref 70–99)

## 2011-07-03 MED ORDER — PREDNISONE 10 MG PO TABS
10.0000 mg | ORAL_TABLET | Freq: Every day | ORAL | Status: DC
  Administered 2011-07-04 – 2011-07-05 (×2): 10 mg via ORAL
  Filled 2011-07-03 (×2): qty 1

## 2011-07-03 NOTE — Progress Notes (Signed)
Subjective: No shortness of breath. Having bowel movements. No chest pain.  Objective: Vital signs in last 24 hours: Filed Vitals:   07/02/11 2205 07/03/11 0417 07/03/11 0540 07/03/11 0718  BP: 136/61  104/57   Pulse: 83  75   Temp: 98.4 F (36.9 C)  98.2 F (36.8 C)   TempSrc: Oral  Oral   Resp: 20  20   Height:      Weight:  66.1 kg (145 lb 11.6 oz)    SpO2: 96%  93% 94%   Weight change: -1.3 kg (-2 lb 13.9 oz)  Intake/Output Summary (Last 24 hours) at 07/03/11 1130 Last data filed at 07/03/11 0417  Gross per 24 hour  Intake    720 ml  Output   1200 ml  Net   -480 ml   Physical Exam:  Gen.: Talkative. Comfortable. Anxious appearing. Lungs clear to auscultation bilaterally without wheeze rhonchi or rales Cardiovascular regular rate rhythm without murmurs gas rubs Abdomen soft nontender nondistended Extremities no clubbing cyanosis or edema   Lab Results: Basic Metabolic Panel:  Lab 07/01/11 6213 06/30/11 0412 06/28/11 0427  NA 142 141 --  K 4.2 3.6 --  CL 105 106 --  CO2 31 27 --  GLUCOSE 124* 132* --  BUN 12 12 --  CREATININE 0.76 0.73 --  CALCIUM 9.3 8.8 --  MG -- -- 2.2  PHOS -- -- --   Liver Function Tests:  Lab 06/29/11 0425  AST 16  ALT 21  ALKPHOS 105  BILITOT 0.2*  PROT 6.4  ALBUMIN 2.8*   No results found for this basename: LIPASE:2,AMYLASE:2 in the last 168 hours No results found for this basename: AMMONIA:2 in the last 168 hours CBC:  Lab 07/01/11 0533 06/29/11 0425 06/27/11 1908  WBC 6.5 12.8* --  NEUTROABS -- -- 7.9*  HGB 12.1 12.0 --  HCT 37.1 37.1 --  MCV 88.5 89.2 --  PLT 200 256 --   Cardiac Enzymes:  Lab 06/28/11 1158 06/28/11 0427 06/27/11 2322  CKTOTAL 112 110 102  CKMB 8.5* 9.0* 7.1*  CKMBINDEX -- -- --  TROPONINI 1.69* 1.95* 1.60*   BNP:  Lab 06/30/11 0910 06/27/11 1843  PROBNP 5949.0* 373.8*   D-Dimer:  Lab 06/27/11 1908  DDIMER 2.45*   CBG:  Lab 07/03/11 0750 07/02/11 2142 07/02/11 1659 07/02/11  1141 07/02/11 0815 07/01/11 2047  GLUCAP 81 135* 152* 92 162* 112*   Hemoglobin A1C:  Lab 06/27/11 2322  HGBA1C 5.9*   Fasting Lipid Panel:  Lab 07/01/11 0534  CHOL 206*  HDL 34*  LDLCALC 145*  TRIG 134  CHOLHDL 6.1  LDLDIRECT --   Thyroid Function Tests:  Lab 06/28/11 0842  TSH 1.325  T4TOTAL --  FREET4 1.00  T3FREE --  THYROIDAB --   Coagulation: No results found for this basename: LABPROT:4,INR:4 in the last 168 hours Anemia Panel: No results found for this basename: VITAMINB12,FOLATE,FERRITIN,TIBC,IRON,RETICCTPCT in the last 168 hours Urine Drug Screen: Drugs of Abuse     Component Value Date/Time   LABOPIA NONE DETECTED 11/24/2006 1355   COCAINSCRNUR POSITIVE* 11/24/2006 1355   LABBENZ NONE DETECTED 11/24/2006 1355   AMPHETMU NONE DETECTED 11/24/2006 1355   THCU NONE DETECTED 11/24/2006 1355   LABBARB  Value: NONE DETECTED        DRUG SCREEN FOR MEDICAL PURPOSES ONLY.  IF CONFIRMATION IS NEEDED FOR ANY PURPOSE, NOTIFY LAB WITHIN 5 DAYS. 11/24/2006 1355    Alcohol Level: No results found for this basename:  ETH:2 in the last 168 hours Urinalysis:  Lab 06/27/11 1853  COLORURINE YELLOW  LABSPEC 1.015  PHURINE 6.0  GLUCOSEU NEGATIVE  HGBUR SMALL*  BILIRUBINUR NEGATIVE  KETONESUR NEGATIVE  PROTEINUR 100*  UROBILINOGEN 0.2  NITRITE NEGATIVE  LEUKOCYTESUR NEGATIVE    Micro Results: Recent Results (from the past 240 hour(s))  CULTURE, BLOOD (ROUTINE X 2)     Status: Normal   Collection Time   06/27/11  6:20 PM      Component Value Range Status Comment   Specimen Description BLOOD RIGHT HAND   Final    Special Requests BOTTLES DRAWN AEROBIC AND ANAEROBIC 6CC   Final    Culture NO GROWTH 5 DAYS   Final    Report Status 07/02/2011 FINAL   Final   CULTURE, BLOOD (ROUTINE X 2)     Status: Normal   Collection Time   06/27/11  6:40 PM      Component Value Range Status Comment   Specimen Description BLOOD RIGHT ANTECUBITAL   Final    Special Requests BOTTLES  DRAWN AEROBIC AND ANAEROBIC 8CC   Final    Culture NO GROWTH 5 DAYS   Final    Report Status 07/02/2011 FINAL   Final   URINE CULTURE     Status: Normal   Collection Time   06/27/11  6:53 PM      Component Value Range Status Comment   Specimen Description URINE, CATHETERIZED   Final    Special Requests NONE   Final    Culture  Setup Time [id number]   Final    Colony Count NO GROWTH   Final    Culture NO GROWTH   Final    Report Status 06/29/2011 FINAL   Final   MRSA PCR SCREENING     Status: Normal   Collection Time   06/27/11 11:42 PM      Component Value Range Status Comment   MRSA by PCR NEGATIVE  NEGATIVE  Final    Studies/Results: No results found. Scheduled Meds:    . albuterol  2.5 mg Nebulization QID   And  . ipratropium  0.5 mg Nebulization QID  . aspirin  325 mg Per NG tube Daily  . carvedilol  3.125 mg Oral BID WC  . clopidogrel  75 mg Oral Q breakfast  . enalapril  2.5 mg Oral Daily  . enoxaparin (LOVENOX) injection  40 mg Subcutaneous Q24H  . Fluticasone-Salmeterol  1 puff Inhalation Q12H  . furosemide  20 mg Oral Daily  . gabapentin  300 mg Oral BID  . insulin aspart  0-20 Units Subcutaneous TID WC  . insulin aspart  0-5 Units Subcutaneous QHS  . moxifloxacin  400 mg Oral q1800  . nicotine  14 mg Transdermal Daily  . pantoprazole  40 mg Oral Q1200  . PARoxetine  30 mg Oral Daily  . predniSONE  10 mg Oral Daily  . regadenoson  0.4 mg Intravenous Once  . rosuvastatin  10 mg Oral q1800  . senna-docusate  2 tablet Oral QHS  . sodium chloride      . traZODone  100 mg Oral QHS  . DISCONTD: lactulose  30 g Oral TID  . DISCONTD: polyethylene glycol  17 g Oral Daily  . DISCONTD: predniSONE  20 mg Oral Daily  . DISCONTD: predniSONE  30 mg Oral Daily   Continuous Infusions:  PRN Meds:.albuterol, ALPRAZolam, oxyCODONE-acetaminophen, zolpidem Assessment/Plan: Principal Problem:  *Acute respiratory failure, extubated Active Problems:  COPD exacerbation  Ischemic cardiomyopathy  MI, acute, non ST segment elevation  PNA (pneumonia)  Demand ischemia of myocardium  ANXIETY  DEPRESSION  Other chronic pain  Hyperglycemia, steroid induced  Thyroid enlargement  Hypotension, resolved Gastroesophageal reflux disease  Steroid taper. Constipation improved. Stress test tomorrow. Check BMET in the morning.   LOS: 6 days   Kristin Wells 07/03/2011, 11:30 AM

## 2011-07-04 ENCOUNTER — Inpatient Hospital Stay (HOSPITAL_COMMUNITY): Payer: PRIVATE HEALTH INSURANCE

## 2011-07-04 ENCOUNTER — Encounter (HOSPITAL_COMMUNITY): Payer: Self-pay | Admitting: Cardiology

## 2011-07-04 DIAGNOSIS — I248 Other forms of acute ischemic heart disease: Secondary | ICD-10-CM

## 2011-07-04 LAB — BASIC METABOLIC PANEL WITH GFR
BUN: 22 mg/dL (ref 6–23)
CO2: 34 meq/L — ABNORMAL HIGH (ref 19–32)
Calcium: 9.3 mg/dL (ref 8.4–10.5)
Chloride: 101 meq/L (ref 96–112)
Creatinine, Ser: 1.15 mg/dL — ABNORMAL HIGH (ref 0.50–1.10)
GFR calc Af Amer: 59 mL/min — ABNORMAL LOW
GFR calc non Af Amer: 51 mL/min — ABNORMAL LOW
Glucose, Bld: 111 mg/dL — ABNORMAL HIGH (ref 70–99)
Potassium: 3.9 meq/L (ref 3.5–5.1)
Sodium: 141 meq/L (ref 135–145)

## 2011-07-04 LAB — GLUCOSE, CAPILLARY
Glucose-Capillary: 174 mg/dL — ABNORMAL HIGH (ref 70–99)
Glucose-Capillary: 120 mg/dL — ABNORMAL HIGH (ref 70–99)
Glucose-Capillary: 148 mg/dL — ABNORMAL HIGH (ref 70–99)
Glucose-Capillary: 104 mg/dL — ABNORMAL HIGH (ref 70–99)
Glucose-Capillary: 105 mg/dL — ABNORMAL HIGH (ref 70–99)
Glucose-Capillary: 131 mg/dL — ABNORMAL HIGH (ref 70–99)

## 2011-07-04 MED ORDER — REGADENOSON 0.4 MG/5ML IV SOLN
INTRAVENOUS | Status: AC
  Administered 2011-07-04: 0.4 mg via INTRAVENOUS
  Filled 2011-07-04: qty 5

## 2011-07-04 MED ORDER — TECHNETIUM TC 99M TETROFOSMIN IV KIT
10.0000 | PACK | Freq: Once | INTRAVENOUS | Status: AC | PRN
  Administered 2011-07-04: 9.5 via INTRAVENOUS

## 2011-07-04 MED ORDER — SODIUM CHLORIDE 0.9 % IJ SOLN
INTRAMUSCULAR | Status: AC
  Administered 2011-07-04: 10 mL via INTRAVENOUS
  Filled 2011-07-04: qty 10

## 2011-07-04 MED ORDER — TECHNETIUM TC 99M TETROFOSMIN IV KIT
30.0000 | PACK | Freq: Once | INTRAVENOUS | Status: AC | PRN
  Administered 2011-07-04: 31.4 via INTRAVENOUS

## 2011-07-04 NOTE — Progress Notes (Signed)
CARE MANAGEMENT NOTE 07/04/2011  Patient:  HALENA, MOHAR   Account Number:  [id number]  Date Initiated:  06/28/2011  Documentation initiated by:  Rosemary Holms  Subjective/Objective Assessment:   Pt admitted from home with Acute Respiratory Failure and intubated. Remains on vent.     Action/Plan:   CM to follow as pt progresses. Alisa with [facility] contacted CM regarding detailed history of pt and multiple issue. Documentation in shadow chart.   Anticipated DC Date:  07/05/2011   Anticipated DC Plan:  HOME W HOME HEALTH SERVICES  In-house referral  Clinical Social Worker      DC Planning Services  CM consult      Choice offered to / List presented to:             Status of service:  In process, will continue to follow Medicare Important Message given?   (If response is "NO", the following Medicare IM given date fields will be blank) Date Medicare IM given:   Date Additional Medicare IM given:    Discharge Disposition:    Per UR Regulation:    Comments:  07/04/11 1200 Pt being followed by [facility], community CM, Alisa. Per Serina Cowper, she willl have HH PT resume.  06/28/11 1500 Jeremih Dearmas Leanord Hawking RN BSN CM

## 2011-07-04 NOTE — Progress Notes (Signed)
[facility] Care Management  Services have been initiated with this patient. For any additional questions or new referrals please contact Anibal Henderson BSN RN [facility] Liaison at [phone].

## 2011-07-04 NOTE — Progress Notes (Signed)
Subjective: Felt dizzy during test, but now fine. No shortness of breath. No cough. No chest pain  Objective: Vital signs in last 24 hours: Filed Vitals:   07/04/11 0646 07/04/11 0724 07/04/11 1448 07/04/11 1519  BP:   82/52   Pulse:   88   Temp:   97.5 F (36.4 C)   TempSrc:   Axillary   Resp:   18   Height:      Weight: 66.4 kg (146 lb 6.2 oz)     SpO2:  96% 90% 95%   Weight change: 0.3 kg (10.6 oz)  Intake/Output Summary (Last 24 hours) at 07/04/11 1655 Last data filed at 07/04/11 1200  Gross per 24 hour  Intake    240 ml  Output    801 ml  Net   -561 ml   Physical Exam:  Gen.: Talkative. Comfortable.  Lungs clear to auscultation bilaterally without wheeze rhonchi or rales Cardiovascular regular rate rhythm without murmurs gas rubs Abdomen soft nontender nondistended Extremities no clubbing cyanosis or edema   Lab Results: Basic Metabolic Panel:  Lab 07/04/11 1308 07/01/11 0533 06/28/11 0427  NA 141 142 --  K 3.9 4.2 --  CL 101 105 --  CO2 34* 31 --  GLUCOSE 111* 124* --  BUN 22 12 --  CREATININE 1.15* 0.76 --  CALCIUM 9.3 9.3 --  MG -- -- 2.2  PHOS -- -- --   Liver Function Tests:  Lab 06/29/11 0425  AST 16  ALT 21  ALKPHOS 105  BILITOT 0.2*  PROT 6.4  ALBUMIN 2.8*   No results found for this basename: LIPASE:2,AMYLASE:2 in the last 168 hours No results found for this basename: AMMONIA:2 in the last 168 hours CBC:  Lab 07/01/11 0533 06/29/11 0425 06/27/11 1908  WBC 6.5 12.8* --  NEUTROABS -- -- 7.9*  HGB 12.1 12.0 --  HCT 37.1 37.1 --  MCV 88.5 89.2 --  PLT 200 256 --   Cardiac Enzymes:  Lab 06/28/11 1158 06/28/11 0427 06/27/11 2322  CKTOTAL 112 110 102  CKMB 8.5* 9.0* 7.1*  CKMBINDEX -- -- --  TROPONINI 1.69* 1.95* 1.60*   BNP:  Lab 06/30/11 0910 06/27/11 1843  PROBNP 5949.0* 373.8*   D-Dimer:  Lab 06/27/11 1908  DDIMER 2.45*   CBG:  Lab 07/04/11 1629 07/04/11 0751 07/03/11 2141 07/03/11 1658 07/03/11 0750 07/02/11  2142  GLUCAP 105* 104* 115* 123* 81 135*   Hemoglobin A1C:  Lab 06/27/11 2322  HGBA1C 5.9*   Fasting Lipid Panel:  Lab 07/01/11 0534  CHOL 206*  HDL 34*  LDLCALC 145*  TRIG 134  CHOLHDL 6.1  LDLDIRECT --   Thyroid Function Tests:  Lab 06/28/11 0842  TSH 1.325  T4TOTAL --  FREET4 1.00  T3FREE --  THYROIDAB --   Coagulation: No results found for this basename: LABPROT:4,INR:4 in the last 168 hours Anemia Panel: No results found for this basename: VITAMINB12,FOLATE,FERRITIN,TIBC,IRON,RETICCTPCT in the last 168 hours Urine Drug Screen: Drugs of Abuse     Component Value Date/Time   LABOPIA NONE DETECTED 11/24/2006 1355   COCAINSCRNUR POSITIVE* 11/24/2006 1355   LABBENZ NONE DETECTED 11/24/2006 1355   AMPHETMU NONE DETECTED 11/24/2006 1355   THCU NONE DETECTED 11/24/2006 1355   LABBARB  Value: NONE DETECTED        DRUG SCREEN FOR MEDICAL PURPOSES ONLY.  IF CONFIRMATION IS NEEDED FOR ANY PURPOSE, NOTIFY LAB WITHIN 5 DAYS. 11/24/2006 1355    Alcohol Level: No results found for this  basename: ETH:2 in the last 168 hours Urinalysis:  Lab 06/27/11 1853  COLORURINE YELLOW  LABSPEC 1.015  PHURINE 6.0  GLUCOSEU NEGATIVE  HGBUR SMALL*  BILIRUBINUR NEGATIVE  KETONESUR NEGATIVE  PROTEINUR 100*  UROBILINOGEN 0.2  NITRITE NEGATIVE  LEUKOCYTESUR NEGATIVE    Micro Results: Recent Results (from the past 240 hour(s))  CULTURE, BLOOD (ROUTINE X 2)     Status: Normal   Collection Time   06/27/11  6:20 PM      Component Value Range Status Comment   Specimen Description BLOOD RIGHT HAND   Final    Special Requests BOTTLES DRAWN AEROBIC AND ANAEROBIC 6CC   Final    Culture NO GROWTH 5 DAYS   Final    Report Status 07/02/2011 FINAL   Final   CULTURE, BLOOD (ROUTINE X 2)     Status: Normal   Collection Time   06/27/11  6:40 PM      Component Value Range Status Comment   Specimen Description BLOOD RIGHT ANTECUBITAL   Final    Special Requests BOTTLES DRAWN AEROBIC AND ANAEROBIC  8CC   Final    Culture NO GROWTH 5 DAYS   Final    Report Status 07/02/2011 FINAL   Final   URINE CULTURE     Status: Normal   Collection Time   06/27/11  6:53 PM      Component Value Range Status Comment   Specimen Description URINE, CATHETERIZED   Final    Special Requests NONE   Final    Culture  Setup Time [id number]   Final    Colony Count NO GROWTH   Final    Culture NO GROWTH   Final    Report Status 06/29/2011 FINAL   Final   MRSA PCR SCREENING     Status: Normal   Collection Time   06/27/11 11:42 PM      Component Value Range Status Comment   MRSA by PCR NEGATIVE  NEGATIVE  Final    Studies/Results: Nm Myocar Single W/spect W/wall Motion And Ef  07/04/2011  This examination was dictated by the cardiologist who supervised the examination.  Please see that report for details.  Original Report Authenticated By: P. Loralie Champagne, M.D.   Scheduled Meds:    . albuterol  2.5 mg Nebulization QID   And  . ipratropium  0.5 mg Nebulization QID  . aspirin  325 mg Per NG tube Daily  . clopidogrel  75 mg Oral Q breakfast  . enoxaparin (LOVENOX) injection  40 mg Subcutaneous Q24H  . Fluticasone-Salmeterol  1 puff Inhalation Q12H  . gabapentin  300 mg Oral BID  . insulin aspart  0-20 Units Subcutaneous TID WC  . insulin aspart  0-5 Units Subcutaneous QHS  . moxifloxacin  400 mg Oral q1800  . nicotine  14 mg Transdermal Daily  . pantoprazole  40 mg Oral Q1200  . PARoxetine  30 mg Oral Daily  . predniSONE  10 mg Oral Daily  . regadenoson      . regadenoson  0.4 mg Intravenous Once  . rosuvastatin  10 mg Oral q1800  . senna-docusate  2 tablet Oral QHS  . sodium chloride      . traZODone  100 mg Oral QHS  . DISCONTD: carvedilol  3.125 mg Oral BID WC  . DISCONTD: enalapril  2.5 mg Oral Daily  . DISCONTD: furosemide  20 mg Oral Daily   Continuous Infusions:  PRN Meds:.albuterol, ALPRAZolam, oxyCODONE-acetaminophen, technetium tetrofosmin, technetium tetrofosmin,  zolpidem Assessment/Plan: Principal Problem:  *Acute respiratory failure, extubated Active Problems:  COPD exacerbation  Ischemic cardiomyopathy  MI, acute, non ST segment elevation  PNA (pneumonia)  Demand ischemia of myocardium  ANXIETY  DEPRESSION  Other chronic pain  Hyperglycemia, steroid induced  Thyroid enlargement  Hypotension, resolved Gastroesophageal reflux disease Asymptomatic hypotension  Await Myoview. Blood pressure is fairly low. Hold enalapril furosemide and carvedilol for now. Blood glucose is now within normal range. Will discontinue insulin and blood glucose monitoring. Patient has had 7 days of antibiotic and is much improved. Will discontinue.   LOS: 7 days   Damen Windsor L 07/04/2011, 4:55 PM

## 2011-07-04 NOTE — Progress Notes (Signed)
Stress Lab Nurses Notes - Kristin Wells  Kristin Wells 07/04/2011  Reason for doing test: Dyspnea Type of test: Kristin Wells Nurse performing test: Parke Poisson, RN Nuclear Medicine Tech: Lou Cal Echo Tech: Not Applicable MD performing test: R. Rothbart Family MD: Hospitalist Test explained and consent signed: yes IV started: Saline lock flushed, No redness or edema and Saline lock from floor Symptoms: dizziness & flushed Treatment/Intervention: None Reason test stopped: protocol completed After recovery IV was: No redness or edema and Saline Lock flushed Patient to return to Nuc. Med at : 13:45 Patient discharged: Transported back to room 321 via wc Patient's Condition upon discharge was: stable Comments: During test BP 90/50 & HR 95.  Recovery BP 88/50 & HR 86.  Symptoms resolved in recovery. Erskine Speed T

## 2011-07-04 NOTE — Progress Notes (Addendum)
2230-Dr. Onalee Hua notified of BP of 72/47, this BP was assessed twice in the same arm. Pt is stable at this time, and ambulated well to the bathroom. Orders to reassess BP in 2hrs or if symptomatic. Sheryn Bison  0981- Dr. Onalee Hua paged that when I asked pt how she was feeling she said fine now, no dizziness. She said that when we ambulated to the bathroom earlier her vision was "fuzzy," and she felt like she may fall until I took her hand.She said it seemed like her legs may not go. Pt has strong equal grips and no pronator drift and denies any of these feelings at this time. Also, to note, she did stand quickly both going to and from the bathroom. Orders received at 2313 to check orthostatic VS when rechecking BP as previously ordered in 2 hrs. Sheryn Bison

## 2011-07-04 NOTE — Progress Notes (Signed)
Brief Nutrition Note  Reason: length of stay  Prior to NPO status patient with documented PO of 75-100%. Reviewed chart. Patient with no nutrition concerns at this time.  RD to follow for nutrition needs.  Iven Finn, MS, RD, LDN Pager [phone]

## 2011-07-04 NOTE — Progress Notes (Signed)
Kristin Wells  61 y.o.  female  Subjective: Denies dyspnea and chest discomfort. No prior episodes of respiratory failure requiring intubation. No cardiac history.  Allergy: Review of patient's allergies indicates no known allergies.  Objective: Vital signs in last 24 hours: Temp:  [97.5 F (36.4 C)-98.1 F (36.7 C)] 97.5 F (36.4 C) (02/25 1448) Pulse Rate:  [73-88] 88  (02/25 1448) Resp:  [18-20] 18  (02/25 1448) BP: (82-94)/(52-60) 82/52 mmHg (02/25 1448) SpO2:  [90 %-96 %] 95 % (02/25 1519) FiO2 (%):  [21 %] 21 % (02/24 2031) Weight:  [66.4 kg (146 lb 6.2 oz)] 66.4 kg (146 lb 6.2 oz) (02/25 0646)  66.4 kg (146 lb 6.2 oz) Body mass index is 26.77 kg/(m^2).  Weight change: 0.3 kg (10.6 oz) Last BM Date: 07/03/11  Intake/Output from previous day: 02/24 0701 - 02/25 0700 In: 240 [P.O.:240] Out: 801 [Urine:801]  General- Well developed; no acute distress Neck- No JVD, no carotid bruits Lungs- mild inspiratory and expiratory rhonchi Cardiovascular- normal PMI; normal S1 and S2; modest systolic murmur Abdomen- normal bowel sounds; soft and non-tender without masses or organomegaly Skin- Warm, no significant lesions Extremities-  no edema  Lab Results: Cardiac Markers:  No results found for this basename: TROPONINI:2,CK,MB:2 in the last 72 hours CBC:  No results found for this basename: WBC:2,HGB:2,HCT:2,PLT:2 in the last 72 hours BMET:  [facility] 07/04/11 0521  NA 141  K 3.9  CL 101  CO2 34*  GLUCOSE 111*  BUN 22  CREATININE 1.15*  CALCIUM 9.3   Imaging Studies/Results:  Imaging: Imaging results have been reviewed.  Pharmacologic stress nuclear study reveals no EKG abnormalities, normal left ventricular systolic function and a minimal defect of borderline significance in the anteroapical segment, likely the result of the breast attenuation and physiologic apical thinning. No convincing evidence for ischemia or infarction.  Medications: I have reviewed the patient's  current medications.   Assessment/Plan: Patient is recovering well from respiratory failure and pneumonia. Minimal myocardial damage does not appear to reflect significant scarring or demonstrable ischemia.  Marked impairment of LV systolic function on admission appears to have reversed. This can be seen in the setting of sepsis or Takotsubo's, but transient myocardial ischemia related to fixed coronary disease is also a consideration. In light of the low risk stress test findings, I would not be inclined to proceed with cardiac catheterization at present and agree with plans for discharge within the next 24-48 hours.  LOS: 7 days   Marlette Bing 07/04/2011, 6:04 PM

## 2011-07-05 ENCOUNTER — Encounter (HOSPITAL_COMMUNITY): Payer: Self-pay | Admitting: Internal Medicine

## 2011-07-05 MED ORDER — ROSUVASTATIN CALCIUM 10 MG PO TABS: 10.0000 mg | ORAL_TABLET | Freq: Every day | ORAL | Status: DC

## 2011-07-05 MED ORDER — SODIUM CHLORIDE 0.9 % IJ SOLN
INTRAMUSCULAR | Status: AC
  Administered 2011-07-05: 3 mL
  Filled 2011-07-05: qty 3

## 2011-07-05 MED ORDER — ASPIRIN EC 81 MG PO TBEC: 81.0000 mg | DELAYED_RELEASE_TABLET | Freq: Every day | ORAL | Status: DC

## 2011-07-05 MED ORDER — SODIUM CHLORIDE 0.9 % IV BOLUS (SEPSIS)
250.0000 mL | Freq: Once | INTRAVENOUS | Status: AC
  Administered 2011-07-05: 250 mL via INTRAVENOUS

## 2011-07-05 NOTE — Progress Notes (Signed)
Pt says she feels like her chest is tight, it feels congested. Pt denies SOB, and pain. Repeatedly says its just congestion or "stopped up." Will notify next shift RN. Sheryn Bison

## 2011-07-05 NOTE — Discharge Summary (Signed)
Physician Discharge Summary  Kristin Wells MRN: [id number] DOB/AGE: 1951/01/17 61 y.o.  PCP: Dorrene German, MD, MD   Admit date: 06/27/2011 Discharge date: 07/05/2011  Discharge Diagnoses:  1. Acute ventilator dependent respiratory failure secondary to COPD with exacerbation, pneumonia, and ischemic cardiomyopathy. Probable sepsis, which may have contributed. 2. Non-ST elevation myocardial infarction. Nuclear cardiac stress test revealed no significant stress-induced EKG changes, normal left ventricular size and systolic function, and minor perfusion defect, but no convincing evidence for ischemia or infarction her cardiologist Dr. Dietrich Pates. 3. Presumed ischemic cardiomyopathy. 2-D echocardiogram revealed an ejection fraction of 25-30%. Nuclear cardiac stress test later revealed an ejection fraction of 65%. Per cardiology, the market impairment of LV systolic function on admission had reversed. This can be seen in the setting of sepsis or Takotsubo's, but transient myocardial ischemia related to a fixed coronary defect was a consideration. 4. Relative hypotension necessitating discontinuation of lisinopril, Lasix, and carvedilol. 5. Hyperlipidemia. The patient's fasting lipid profile revealed total cholesterol of 206, triglycerides 134, HDL cholesterol 34, and LDL cholesterol of 145. 6. Thyroid enlargement. Thyroid function within normal limits with a TSH of 1.3 and her free T4-1 0.0. 7. Chronic pain syndrome/chronic back pain, on chronic opiates. 8. Depression with anxiety. 9. Steroid-induced hyperglycemia. 10. Deconditioning.   Medication List  As of 07/05/2011 12:18 PM   TAKE these medications         albuterol (2.5 MG/3ML) 0.083% nebulizer solution   Commonly known as: PROVENTIL   Take 2.5 mg by nebulization every 6 (six) hours as needed.      albuterol 108 (90 BASE) MCG/ACT inhaler   Commonly known as: PROVENTIL HFA;VENTOLIN HFA   Inhale 1-2 puffs into the lungs every 6  (six) hours as needed for wheezing.      ALPRAZolam 0.5 MG tablet   Commonly known as: XANAX   Take 0.5 mg by mouth 4 (four) times daily as needed.      aspirin EC 81 MG tablet   Take 1 tablet (81 mg total) by mouth daily.      cyclobenzaprine 10 MG tablet   Commonly known as: FLEXERIL   Take 5 mg by mouth 2 (two) times daily.      DALIRESP 500 MCG Tabs tablet   Generic drug: roflumilast   Take 500 mcg by mouth daily.      DEXILANT 60 MG capsule   Generic drug: dexlansoprazole   Take 60 mg by mouth daily.      Fluticasone-Salmeterol 250-50 MCG/DOSE Aepb   Commonly known as: ADVAIR   Inhale 1 puff into the lungs every 12 (twelve) hours.      gabapentin 300 MG capsule   Commonly known as: NEURONTIN   Take 300 mg by mouth 2 (two) times daily.      OVER THE COUNTER MEDICATION   Take 1 capsule by mouth every morning. **Calcium 600 + Vitamin D 1200 Combination Tablet**      oxyCODONE-acetaminophen 5-325 MG per tablet   Commonly known as: PERCOCET   Take 0.5 tablets by mouth every 4 (four) hours as needed. Back spurs      PARoxetine 30 MG tablet   Commonly known as: PAXIL   Take 30 mg by mouth every morning.      polyethylene glycol powder powder   Commonly known as: GLYCOLAX/MIRALAX   Take 17 g by mouth daily.      rosuvastatin 10 MG tablet   Commonly known as: CRESTOR   Take 1  tablet (10 mg total) by mouth daily at 6 PM. FOR YOUR HEART AND TO LOWER CHOLESTEROL.      tiotropium 18 MCG inhalation capsule   Commonly known as: SPIRIVA   Place 18 mcg into inhaler and inhale daily.      traZODone 100 MG tablet   Commonly known as: DESYREL   Take 100 mg by mouth at bedtime.      zolpidem 5 MG tablet   Commonly known as: AMBIEN   Take 2 tablets (10 mg total) by mouth at bedtime as needed for sleep.            Discharge Condition: Stable and improved.  Disposition: 01-Home or Self Care   Consults: Dr. Juanetta Gosling, Dr. Diona Browner, and Dr. Dietrich Pates.   Significant  Diagnostic Studies: Ct Angio Chest W/cm &/or Wo Cm  06/28/2011  *RADIOLOGY REPORT*  Clinical Data: Acute respiratory failure; elevated D-dimer. History of asthma.  CT ANGIOGRAPHY CHEST  Technique:  Multidetector CT imaging of the chest using the standard protocol during bolus administration of intravenous contrast. Multiplanar reconstructed images including MIPs were obtained and reviewed to evaluate the vascular anatomy.  Contrast: OMNIPAQUE IOHEXOL 350 MG/ML IV SOLN  Comparison: Chest radiograph performed 06/27/2011, and CTA of the chest performed 01/08/2010  Findings: There is no evidence of pulmonary embolus.  Mild patchy airspace opacity is noted along the medial aspect of the right upper lobe, and minimally in the right middle lobe in a peribronchial distribution.  There is diffuse bilateral peribronchial soft tissue thickening; this may reflect the patient's history of asthma.  Mild bilateral dependent subsegmental atelectasis is noted.  There is no evidence of pleural effusion or pneumothorax.  No masses are identified; no abnormal focal contrast enhancement is seen.  Diffuse coronary artery calcifications are seen.  The mediastinum is otherwise unremarkable in appearance.  Scattered small periaortic, aortopulmonary window and right paratracheal nodes remain borderline normal in size, without definite evidence for mediastinal lymphadenopathy.  The patient's enteric tube is noted extending to the body of the stomach.  The endotracheal tube is seen ending 2 cm above the carina.  No pericardial effusion is seen.  The great vessels are unremarkable in appearance.  No axillary lymphadenopathy is seen. There is vague asymmetric enlargement of the right thyroid lobe, with scattered calcification; this appears grossly stable from the prior CTA performed 2011, though correlation with prior lab findings or thyroid ultrasound would be helpful.  The visualized portions of the liver and spleen are unremarkable.  The visualized portions of the pancreas, stomach, adrenal glands and kidneys are within normal limits.  No acute osseous abnormalities are seen.  A lucent lesion within the L1 vertebral body was seen to reflect a benign vertebral body hemangioma on the prior MRI of the lumbar spine.  Degenerative change is noted at the upper lumbar spine, with diffuse sclerosis.  IMPRESSION:  1.  No evidence of pulmonary embolus. 2.  Mild patchy airspace opacity at the medial aspect of the right upper lobe, and in a peribronchial distribution in the right middle lobe.  This raises concern for mild pneumonia, possibly atypical in nature. 3.  Diffuse bilateral peribronchial soft tissue thickening; this may reflect the patient's history of asthma, but appears worsened from the prior CTA. 4.  Mild bilateral dependent subsegmental atelectasis noted. 5.  Diffuse coronary artery calcifications seen. 6.  Vague asymmetric enlargement of the right thyroid lobe, with scattered calcification.  This appears grossly stable from the CTA performed in 2011, but  correlation with prior lab findings or thyroid ultrasound would be helpful.  Original Report Authenticated By: Tonia Ghent, M.D.   Nm Myocar Single W/spect W/wall Motion And Ef  07/04/2011  This examination was dictated by the cardiologist who supervised the examination.  Please see that report for details.  nm myoview pharmacologic stress  Ordering Physician: Joni Reining  Reading Physician: [place] Bing  Clinical Data: 61 year old woman admitted to hospital with respiratory failure and pneumonia; cardiac markers suggest modest myocardial necrosis.  NUCLEAR MEDICINE ADENOSINE STRESS MYOVIEW STUDY WITH SPECT AND LEFT VENTRIUCLAR EJECTION FRACTION  Radionuclide Data: One-day rest/stress protocol performed with 10/30 mCi of Tc-55m Myoview.  Stress Data: Regadenoson infusion resulted in dizziness and malaise.  There was no change in blood pressure from an initial value of 88/50, but  heart rate increased moderately following drug administration.  No arrhythmias noted.  EKG: Normal sinus rhythm; low voltage in the chest leads; borderline QT prolongation; T-wave abnormality consistent with inferolateral ischemia.  No significant change following Regadenoson infusion.  Scintigraphic Data: Acquisition notable for mild breast and diaphragmatic attenuation.  There was mild subdiaphragmatic activity adjacent to the inferior myocardium.  Left ventricular size was normal.  On tomographic images reconstructed in standard planes, there was a minimal anteroapical defect with an intensity that was of borderline numeric significance by quantitative analysis.  By comparison to the resting portion of the study, no reversibility was apparent.  The gated reconstruction demonstrated normal to hyperdynamic regional and global LV systolic function as well as normal systolic accentuation of activity throughout. Estimated ejection fraction exceeded 65%.  IMPRESSION: Low risk pharmacologic stress nuclear myocardial study revealing no significant stress-induced EKG abnormalities, normal left ventricular size and normal left ventricular systolic function.  A minor perfusion defect was present as described above, which may represent superimposed breast attenuation and physiologic apical thinning.  No convincing evidence for ischemia or infarction.  Original Report Authenticated By: Meliton Rattan Chest Port 1 View  06/29/2011  *RADIOLOGY REPORT*  Clinical Data: Ventilator dependent respiratory failure.  Follow up left basilar atelectasis and/or pneumonia.  PORTABLE CHEST - 1 VIEW 06/29/2011 0525 hours:  Comparison: Portable chest x-rays yesterday, 06/27/2011, and 10/27/2009 [facility].  CTA chest yesterday.  Findings: Endotracheal tube tip approximately 2-3 cm above the carina.  Interval improvement in the opacities at the left lung base, with mild linear atelectasis persisting.  Mildly prominent  bronchovascular markings diffusely, unchanged.  No new pulmonary parenchymal abnormalities.  Nasogastric courses below the diaphragm into the stomach but its tip is not included on the image.  Cardiac silhouette normal in size for the AP portable technique.  IMPRESSION: Support apparatus satisfactory.  Improved aeration of the left lung base, with mild atelectasis persisting.  Stable mild changes of chronic bronchitis/asthma.  No new abnormalities.  Original Report Authenticated By: Arnell Sieving, M.D.   Portable Chest Xray In Am  06/28/2011  *RADIOLOGY REPORT*  Clinical Data: Acute respiratory distress, on ventilator  PORTABLE CHEST - 1 VIEW  Comparison: Portable chest x-ray of 06/27/2011  Findings: The tip of the endotracheal tube is obscured by overlying electrode leads.  The tip appears to be approximately 1.5 cm above the carina.  Prominent markings are noted particularly at the left lung base and pneumonia cannot be excluded.  No effusion is seen. Heart size is stable.  IMPRESSION:  1.  Tip of endotracheal tube is obscured, probably approximately 1.5 cm above the carina. 2. Prominent markings at the left lung base  suspicious for pneumonia.  Original Report Authenticated By: Juline Patch, M.D.   Dg Chest Port 1 View  06/27/2011  *RADIOLOGY REPORT*  Clinical Data: Intubated.  Smoker.  COPD.  Dyspnea.  PORTABLE CHEST - 1 VIEW  Comparison: 05/26/2011.  Findings: Endotracheal tube in satisfactory position.  Nasogastric tube extending into the stomach.  Stable normal sized heart.  The lungs are clear and mildly hyperexpanded with stable mildly prominent interstitial markings.  Unremarkable bones.  IMPRESSION:  1.  Endotracheal tube in satisfactory position. 2.  COPD.  Original Report Authenticated By: Darrol Angel, M.D.   ECHO:Study Conclusions  - Left ventricle: The cavity size was normal. Mild to moderate LVH with disproportionate septal hypertrophy. Systolic function was severely reduced. The  estimated ejection fraction was in the range of 25% to 30%. Moderate to severe hypokinesis of the distal anteroseptal myocardium. Apical akinesis to mild dyskinesis. Akinesis of the mid-distalinferior myocardium. - Aortic valve: Mildly calcified annulus. - Right ventricle: The cavity size was mildly decreased. Wall thickness was mildly increased. - Atrial septum: No defect or patent foramen ovale was identified. - Pericardium, extracardiac: A small pericardial effusion was identified along the right ventricular free wall. The fluid exhibited a fibrinous appearance.There was no evidence of hemodynamic compromise. Transthoracic echocardiography. M-mode, complete 2D, spectral Doppler, and color Doppler. Height: Height: 157.5cm. Height: 62in. Weight: Weight: 66.7kg. Weight: 146.7lb. Body mass index: BMI: 26.9kg/m^2. Body surface area: BSA: 1.60m^2. Patient status: Inpatient. Location: ICU/CCU     Microbiology: Recent Results (from the past 240 hour(s))  CULTURE, BLOOD (ROUTINE X 2)     Status: Normal   Collection Time   06/27/11  6:20 PM      Component Value Range Status Comment   Specimen Description BLOOD RIGHT HAND   Final    Special Requests BOTTLES DRAWN AEROBIC AND ANAEROBIC 6CC   Final    Culture NO GROWTH 5 DAYS   Final    Report Status 07/02/2011 FINAL   Final   CULTURE, BLOOD (ROUTINE X 2)     Status: Normal   Collection Time   06/27/11  6:40 PM      Component Value Range Status Comment   Specimen Description BLOOD RIGHT ANTECUBITAL   Final    Special Requests BOTTLES DRAWN AEROBIC AND ANAEROBIC 8CC   Final    Culture NO GROWTH 5 DAYS   Final    Report Status 07/02/2011 FINAL   Final   URINE CULTURE     Status: Normal   Collection Time   06/27/11  6:53 PM      Component Value Range Status Comment   Specimen Description URINE, CATHETERIZED   Final    Special Requests NONE   Final    Culture  Setup Time [id number]   Final    Colony Count NO GROWTH   Final     Culture NO GROWTH   Final    Report Status 06/29/2011 FINAL   Final   MRSA PCR SCREENING     Status: Normal   Collection Time   06/27/11 11:42 PM      Component Value Range Status Comment   MRSA by PCR NEGATIVE  NEGATIVE  Final      Labs: Results for orders placed during the hospital encounter of 06/27/11 (from the past 48 hour(s))  GLUCOSE, CAPILLARY     Status: Abnormal   Collection Time   07/03/11  4:58 PM      Component Value Range Comment   Glucose-Capillary  123 (*) 70 - 99 (mg/dL)   GLUCOSE, CAPILLARY     Status: Abnormal   Collection Time   07/03/11  9:41 PM      Component Value Range Comment   Glucose-Capillary 115 (*) 70 - 99 (mg/dL)   BASIC METABOLIC PANEL     Status: Abnormal   Collection Time   07/04/11  5:21 AM      Component Value Range Comment   Sodium 141  135 - 145 (mEq/L)    Potassium 3.9  3.5 - 5.1 (mEq/L)    Chloride 101  96 - 112 (mEq/L)    CO2 34 (*) 19 - 32 (mEq/L)    Glucose, Bld 111 (*) 70 - 99 (mg/dL)    BUN 22  6 - 23 (mg/dL)    Creatinine, Ser 3.08 (*) 0.50 - 1.10 (mg/dL)    Calcium 9.3  8.4 - 10.5 (mg/dL)    GFR calc non Af Amer 51 (*) >90 (mL/min)    GFR calc Af Amer 59 (*) >90 (mL/min)   GLUCOSE, CAPILLARY     Status: Abnormal   Collection Time   07/04/11  7:51 AM      Component Value Range Comment   Glucose-Capillary 104 (*) 70 - 99 (mg/dL)   GLUCOSE, CAPILLARY     Status: Abnormal   Collection Time   07/04/11  4:29 PM      Component Value Range Comment   Glucose-Capillary 105 (*) 70 - 99 (mg/dL)   GLUCOSE, CAPILLARY     Status: Abnormal   Collection Time   07/04/11  9:09 PM      Component Value Range Comment   Glucose-Capillary 131 (*) 70 - 99 (mg/dL)      HPI : The patient is a 61 year old woman with a past medical history significant for COPD, depression with anxiety, and chronic low back pain, who presented to the emergency department on 06/27/2011 with a chief complaint of shortness of breath and wheezing. When she was initially  evaluated in the emergency department, she was noted to be hypoxic and in respiratory distress. She was subsequently intubated and mechanically ventilated by the emergency department physician. Her chest x-ray following intubation revealed COPD. Her EKG revealed sinus tachycardia with a heart rate of 142 beats per minute and nonspecific ST and T-wave findings. Her d-dimer was elevated at 2.45. Following this results, a CT angiogram of her chest was ordered. It revealed no evidence of PE, but it did reveal patchy right upper lobe infiltrate. She was admitted for further evaluation and management.  HOSPITAL COURSE: The patient was admitted to the ICU on the ventilator. Blood cultures were ordered in the emergency department. She was subsequently started on antibiotic therapy with Avelox and steroid therapy with Solu-Medrol. Later, vancomycin was added empirically. Albuterol and Atrovent nebulizers were ordered every 4 hours. Other supportive treatment was given. Pulmonologist, Dr. Juanetta Gosling was consulted. He adjusted the ventilator settings and conferred with the respiratory therapist about the ventilator management. A number of studies were ordered. Her cardiac enzymes were found to be elevated. It was presumed that she was having a non-ST elevation myocardial infarction versus demand ischemia from sepsis, ventilator dependent respiratory failure, and pneumonia. Nevertheless, she was started on full dose Lovenox, aspirin, and beta blockade therapy. A 2-D echocardiogram was ordered. It revealed a severely reduced left ventricular systolic function with an ejection fraction of 25-30% and a small pericardial effusion. There was virtual akinesis of the mid- distal inferior myocardium. Cardiologist, Dr. Dietrich Pates  was consulted. Per his initial assessment, he doubted that the patient had acute coronary syndrome given that there was a modest increase in the troponin and with a normal CPK, indicating minimal if any  myocardial necrosis. He discontinued the treatment dose Lovenox and started prophylactic Lovenox. He also knowledge that the echocardiogram indicated ischemic cardiomyopathy of uncertain duration. He started IV furosemide and IV enalapril.   She was noted to have an enlarged right lobe of thyroid. Her TSH and free T4 were within normal limits. Her venous glucose/capillary glucose increased. This was thought to be secondary to IV steroids. Her hemoglobin A1c was 5.9. She was treated accordingly with sliding-scale NovoLog and Lantus.  The patient was successfully extubated from the ventilator several days following intubation. Her IV medications were transitioned to by mouth. Her fasting lipid profile was assessed. The results were dictated above. She was started on Crestor. Over the course of the hospitalization, she had several episodes of hypotension which were thought to be related to intubation and sedated medications while she was ventilated. However, following extubation, she continued to have low to low-normal blood pressures. For this reason, Coreg, enalapril, and furosemide were held and eventually discontinued, particularly when her Myoview stress test revealed no significant ischemia and a normal left ventricular ejection fraction.  The cardiology team pursued further evaluation of the patient's newly diagnosed ischemic cardiomyopathy. The Myoview stress test was performed and the results are noted above. Per Dr. Marvel Plan assessment, the market impairment of LV systolic function on admission appeared to have reversed. This can be seen in the setting of sepsis or Takotsubo's but transient myocardial ischemia related to a fixed coronary defect was also a consideration. He did not believe that the patient needed a cardiac catheterization following these results.  The patient improved clinically and symptomatically. All of her blood cultures, urine culture and MRSA by PCR were negative. Antibiotic  therapy and steroid therapy were completed during the hospitalization. She was evaluated by the physical therapist who recommended continuance of home health physical therapy. She was advised to continue aspirin and statin therapy. Further cardiac evaluation will be deferred to the outpatient setting. She will followup with lobe our cardiology in 2 weeks as scheduled.      Discharge Exam: Blood pressure 95/63, pulse 73, temperature 97.9 F (36.6 C), temperature source Oral, resp. rate 18, height 5\' 2"  (1.575 m), weight 66.4 kg (146 lb 6.2 oz), SpO2 97.00%.  Lungs: Clear to auscultation bilaterally. Heart: S1, S2, with no murmurs rubs or gallops. Abdomen: Positive bowel sounds, soft, nontender, nondistended. Extremities: No pedal edema. Neurologic: She is alert and oriented x3. Cranial nerves II through XII are intact.   Discharge Orders    Future Appointments: Provider: Department: Dept Phone: Center:   07/21/2011 1:20 PM Joni Reining, NP [facility] [phone] [id number]      Follow-up Information    Follow up with Joni Reining, NP on 07/21/2011. (1:20 [place] Office)    Contact information:   [address] [place] [address] [phone]       Follow up with Dorrene German, MD on 07/18/2011. (AT 11:15 AM)    Contact information:   [address] [place] [address] [phone]           Total discharge time: 40 minutes.   Signed: Czar Ysaguirre 07/05/2011, 12:18 PM

## 2011-07-05 NOTE — Progress Notes (Signed)
Physical Therapy Treatment Patient Details Name: Kristin Wells MRN: [id number] DOB: March 23, 1951 Today's Date: 07/05/2011 Time: 8:08-8:45 Charge: TE 25 min Gait 12 min PT Assessment/Plan  PT - Assessment/Plan Comments on Treatment Session: Pt tolerated well towards total PT session.  Pt able to perform all HEP exercises without difficulty following instructions.  O2 sat stayed at 93-94%, no O2 used with therex or gait.   PT Goals  Acute Rehab PT Goals PT Goal: Ambulate - Progress: Met PT Goal: Perform Home Exercise Program - Progress: Progressing toward goal (given HEP worksheet including TKR, SLR, ankle pumps and brid)  PT Treatment Precautions/Restrictions  Precautions Required Braces or Orthoses: No Restrictions Weight Bearing Restrictions: No Mobility (including Balance) Transfers Transfers: Yes Sit to Stand: 5: Supervision Sit to Stand Details (indicate cue type and reason): vc-ing for hand placement Stand to Sit: 5: Supervision Ambulation/Gait Ambulation/Gait: Yes Ambulation/Gait Assistance: 6: Modified independent (Device/Increase time) Ambulation/Gait Assistance Details (indicate cue type and reason): posture cueing  Ambulation Distance (Feet): 300 Feet Assistive device: Rolling walker Gait Pattern: Within Functional Limits Gait velocity: [facility]    Exercise  General Exercises - Lower Extremity Ankle Circles/Pumps: AROM;Both;10 reps;Seated Heel Slides: AROM;Both;10 reps;Supine Straight Leg Raises: AROM;Both;10 reps;Supine Other Exercises Other Exercises: trunk rotation x 10 Other Exercises: bridge x 10 End of Session PT - End of Session Equipment Utilized During Treatment: Gait belt Patient left: in chair;with call bell in reach General Behavior During Session: [facility] for tasks performed Cognition: [facility] for tasks performed  Juel Burrow 07/05/2011, 8:46 AM

## 2011-07-05 NOTE — Progress Notes (Signed)
Pt discharged home via family; Pt and family given and explained all discharge instructions, carenotes, and prescriptions; pt and family stated understanding and denied questions/concerns; all f/u appointments in place; IV removed without complicaitons; pt stable at time of discharge; pt given specific instructions on follow-up appointments, diet, and home health

## 2011-07-05 NOTE — Progress Notes (Signed)
29- notified Dr. Onalee Hua of results of orthostatic VS per her request. 250 cc bolus of NS ordered. Bolus started at 0115. Sheryn Bison

## 2011-07-11 ENCOUNTER — Inpatient Hospital Stay (HOSPITAL_COMMUNITY)
Admission: EM | Admit: 2011-07-11 | Discharge: 2011-07-14 | DRG: 189 | Disposition: A | Discharge status: Home Health | Payer: PRIVATE HEALTH INSURANCE | Attending: Internal Medicine | Admitting: Internal Medicine

## 2011-07-11 ENCOUNTER — Encounter (HOSPITAL_COMMUNITY): Payer: Self-pay | Admitting: Emergency Medicine

## 2011-07-11 ENCOUNTER — Other Ambulatory Visit: Payer: Self-pay

## 2011-07-11 ENCOUNTER — Emergency Department (HOSPITAL_COMMUNITY): Payer: PRIVATE HEALTH INSURANCE

## 2011-07-11 DIAGNOSIS — I10 Essential (primary) hypertension: Secondary | ICD-10-CM | POA: Diagnosis present

## 2011-07-11 DIAGNOSIS — F17200 Nicotine dependence, unspecified, uncomplicated: Secondary | ICD-10-CM | POA: Diagnosis present

## 2011-07-11 DIAGNOSIS — E785 Hyperlipidemia, unspecified: Secondary | ICD-10-CM | POA: Diagnosis present

## 2011-07-11 DIAGNOSIS — K219 Gastro-esophageal reflux disease without esophagitis: Secondary | ICD-10-CM | POA: Diagnosis present

## 2011-07-11 DIAGNOSIS — J440 Chronic obstructive pulmonary disease with acute lower respiratory infection: Secondary | ICD-10-CM | POA: Diagnosis present

## 2011-07-11 DIAGNOSIS — J209 Acute bronchitis, unspecified: Secondary | ICD-10-CM | POA: Diagnosis present

## 2011-07-11 DIAGNOSIS — I214 Non-ST elevation (NSTEMI) myocardial infarction: Secondary | ICD-10-CM | POA: Diagnosis present

## 2011-07-11 DIAGNOSIS — Z79899 Other long term (current) drug therapy: Secondary | ICD-10-CM

## 2011-07-11 DIAGNOSIS — I2589 Other forms of chronic ischemic heart disease: Secondary | ICD-10-CM | POA: Diagnosis present

## 2011-07-11 DIAGNOSIS — J9600 Acute respiratory failure, unspecified whether with hypoxia or hypercapnia: Principal | ICD-10-CM | POA: Diagnosis present

## 2011-07-11 DIAGNOSIS — F419 Anxiety disorder, unspecified: Secondary | ICD-10-CM | POA: Diagnosis present

## 2011-07-11 DIAGNOSIS — F411 Generalized anxiety disorder: Secondary | ICD-10-CM | POA: Diagnosis present

## 2011-07-11 DIAGNOSIS — Z8701 Personal history of pneumonia (recurrent): Secondary | ICD-10-CM

## 2011-07-11 DIAGNOSIS — I959 Hypotension, unspecified: Secondary | ICD-10-CM | POA: Diagnosis present

## 2011-07-11 DIAGNOSIS — Z720 Tobacco use: Secondary | ICD-10-CM | POA: Diagnosis present

## 2011-07-11 DIAGNOSIS — Z7982 Long term (current) use of aspirin: Secondary | ICD-10-CM

## 2011-07-11 DIAGNOSIS — IMO0002 Reserved for concepts with insufficient information to code with codable children: Secondary | ICD-10-CM

## 2011-07-11 DIAGNOSIS — I498 Other specified cardiac arrhythmias: Secondary | ICD-10-CM | POA: Diagnosis present

## 2011-07-11 DIAGNOSIS — J441 Chronic obstructive pulmonary disease with (acute) exacerbation: Secondary | ICD-10-CM

## 2011-07-11 DIAGNOSIS — J45901 Unspecified asthma with (acute) exacerbation: Secondary | ICD-10-CM | POA: Diagnosis present

## 2011-07-11 LAB — BASIC METABOLIC PANEL
BUN: 8 mg/dL (ref 6–23)
CO2: 32 mEq/L (ref 19–32)
Calcium: 9.6 mg/dL (ref 8.4–10.5)
Chloride: 103 mEq/L (ref 96–112)
Creatinine, Ser: 0.86 mg/dL (ref 0.50–1.10)
GFR calc Af Amer: 84 mL/min — ABNORMAL LOW (ref >90)
GFR calc non Af Amer: 73 mL/min — ABNORMAL LOW (ref >90)
Glucose, Bld: 108 mg/dL — ABNORMAL HIGH (ref 70–99)
Potassium: 4.4 mEq/L (ref 3.5–5.1)
Sodium: 142 mEq/L (ref 135–145)

## 2011-07-11 LAB — CARDIAC PANEL(CRET KIN+CKTOT+MB+TROPI)
CK, MB: 2.4 ng/mL (ref 0.3–4.0)
Relative Index: INVALID (ref 0.0–2.5)
Total CK: 30 U/L (ref 7–177)
Troponin I: <0.3 ng/mL (ref <0.30)

## 2011-07-11 LAB — DIFFERENTIAL
Basophils Absolute: 0 10*3/uL (ref 0.0–0.1)
Basophils Relative: 0 % (ref 0–1)
Eosinophils Absolute: 0.8 10*3/uL — ABNORMAL HIGH (ref 0.0–0.7)
Eosinophils Relative: 8 % — ABNORMAL HIGH (ref 0–5)
Lymphocytes Relative: 31 % (ref 12–46)
Lymphs Abs: 3.1 10*3/uL (ref 0.7–4.0)
Monocytes Absolute: 0.6 10*3/uL (ref 0.1–1.0)
Monocytes Relative: 6 % (ref 3–12)
Neutro Abs: 5.3 10*3/uL (ref 1.7–7.7)
Neutrophils Relative %: 54 % (ref 43–77)

## 2011-07-11 LAB — BLOOD GAS, ARTERIAL
Acid-Base Excess: 3.6 mmol/L — ABNORMAL HIGH (ref 0.0–2.0)
Bicarbonate: 29.1 mEq/L — ABNORMAL HIGH (ref 20.0–24.0)
O2 Content: 3 L/min
O2 Saturation: 90.9 %
Patient temperature: 37
TCO2: 26.1 mmol/L (ref 0–100)
pCO2 arterial: 56.6 mmHg — ABNORMAL HIGH (ref 35.0–45.0)
pH, Arterial: 7.331 — ABNORMAL LOW (ref 7.350–7.400)
pO2, Arterial: 62.7 mmHg — ABNORMAL LOW (ref 80.0–100.0)

## 2011-07-11 LAB — CBC
HCT: 43.3 % (ref 36.0–46.0)
Hemoglobin: 14 g/dL (ref 12.0–15.0)
MCH: 29.2 pg (ref 26.0–34.0)
MCHC: 32.3 g/dL (ref 30.0–36.0)
MCV: 90.2 fL (ref 78.0–100.0)
Platelets: 228 10*3/uL (ref 150–400)
RBC: 4.8 MIL/uL (ref 3.87–5.11)
RDW: 15 % (ref 11.5–15.5)
WBC: 9.8 10*3/uL (ref 4.0–10.5)

## 2011-07-11 LAB — PHOSPHORUS: Phosphorus: 3 mg/dL (ref 2.3–4.6)

## 2011-07-11 LAB — PRO B NATRIURETIC PEPTIDE
Pro B Natriuretic peptide (BNP): 266 pg/mL — ABNORMAL HIGH (ref 0–125)
Pro B Natriuretic peptide (BNP): 371.2 pg/mL — ABNORMAL HIGH (ref 0–125)

## 2011-07-11 LAB — MAGNESIUM: Magnesium: 2.2 mg/dL (ref 1.5–2.5)

## 2011-07-11 MED ORDER — VANCOMYCIN HCL IN DEXTROSE 1-5 GM/200ML-% IV SOLN
1000.0000 mg | Freq: Once | INTRAVENOUS | Status: AC
  Administered 2011-07-11: 1000 mg via INTRAVENOUS
  Filled 2011-07-11: qty 200

## 2011-07-11 MED ORDER — ATORVASTATIN CALCIUM 10 MG PO TABS
20.0000 mg | ORAL_TABLET | Freq: Every day | ORAL | Status: DC
  Administered 2011-07-11 – 2011-07-13 (×3): 20 mg via ORAL
  Filled 2011-07-11: qty 2
  Filled 2011-07-11: qty 1
  Filled 2011-07-11 (×2): qty 2

## 2011-07-11 MED ORDER — TRAZODONE HCL 50 MG PO TABS
100.0000 mg | ORAL_TABLET | Freq: Every day | ORAL | Status: DC
  Administered 2011-07-11 – 2011-07-13 (×3): 100 mg via ORAL
  Filled 2011-07-11 (×3): qty 2

## 2011-07-11 MED ORDER — ALBUTEROL (5 MG/ML) CONTINUOUS INHALATION SOLN
10.0000 mg/h | INHALATION_SOLUTION | Freq: Once | RESPIRATORY_TRACT | Status: AC
  Administered 2011-07-11: 10 mg/h via RESPIRATORY_TRACT
  Filled 2011-07-11: qty 20

## 2011-07-11 MED ORDER — GABAPENTIN 300 MG PO CAPS
300.0000 mg | ORAL_CAPSULE | Freq: Two times a day (BID) | ORAL | Status: DC
  Administered 2011-07-11 – 2011-07-14 (×6): 300 mg via ORAL
  Filled 2011-07-11 (×6): qty 1

## 2011-07-11 MED ORDER — ACETAMINOPHEN 650 MG RE SUPP: 650.0000 mg | Freq: Four times a day (QID) | RECTAL | Status: DC | PRN

## 2011-07-11 MED ORDER — VANCOMYCIN HCL IN DEXTROSE 1-5 GM/200ML-% IV SOLN
INTRAVENOUS | Status: AC
  Filled 2011-07-11: qty 200

## 2011-07-11 MED ORDER — ACETAMINOPHEN 325 MG PO TABS: 650.0000 mg | ORAL_TABLET | Freq: Four times a day (QID) | ORAL | Status: DC | PRN

## 2011-07-11 MED ORDER — TIOTROPIUM BROMIDE MONOHYDRATE 18 MCG IN CAPS
18.0000 ug | ORAL_CAPSULE | Freq: Every day | RESPIRATORY_TRACT | Status: DC
  Administered 2011-07-12 – 2011-07-14 (×3): 18 ug via RESPIRATORY_TRACT
  Filled 2011-07-11: qty 5

## 2011-07-11 MED ORDER — METHYLPREDNISOLONE SODIUM SUCC 125 MG IJ SOLR
125.0000 mg | Freq: Once | INTRAMUSCULAR | Status: AC
  Administered 2011-07-11: 125 mg via INTRAVENOUS
  Filled 2011-07-11: qty 2

## 2011-07-11 MED ORDER — ALBUTEROL (5 MG/ML) CONTINUOUS INHALATION SOLN: 5.0000 mg/h | INHALATION_SOLUTION | Freq: Once | RESPIRATORY_TRACT | Status: DC

## 2011-07-11 MED ORDER — METHYLPREDNISOLONE SODIUM SUCC 125 MG IJ SOLR
125.0000 mg | Freq: Four times a day (QID) | INTRAMUSCULAR | Status: DC
  Administered 2011-07-11 – 2011-07-13 (×7): 125 mg via INTRAVENOUS
  Filled 2011-07-11 (×7): qty 2

## 2011-07-11 MED ORDER — PAROXETINE HCL 20 MG PO TABS
30.0000 mg | ORAL_TABLET | Freq: Every day | ORAL | Status: DC
  Administered 2011-07-12 – 2011-07-14 (×3): 30 mg via ORAL
  Filled 2011-07-11 (×3): qty 2

## 2011-07-11 MED ORDER — ALBUTEROL SULFATE (5 MG/ML) 0.5% IN NEBU
2.5000 mg | INHALATION_SOLUTION | Freq: Four times a day (QID) | RESPIRATORY_TRACT | Status: DC
  Administered 2011-07-11 – 2011-07-14 (×9): 2.5 mg via RESPIRATORY_TRACT
  Filled 2011-07-11 (×9): qty 0.5

## 2011-07-11 MED ORDER — MORPHINE SULFATE 2 MG/ML IJ SOLN: 2.0000 mg | INTRAMUSCULAR | Status: DC | PRN

## 2011-07-11 MED ORDER — LEVOFLOXACIN IN D5W 750 MG/150ML IV SOLN
750.0000 mg | INTRAVENOUS | Status: DC
  Administered 2011-07-11 – 2011-07-12 (×2): 750 mg via INTRAVENOUS
  Filled 2011-07-11 (×2): qty 150

## 2011-07-11 MED ORDER — ASPIRIN EC 81 MG PO TBEC
81.0000 mg | DELAYED_RELEASE_TABLET | Freq: Every day | ORAL | Status: DC
  Administered 2011-07-12 – 2011-07-14 (×3): 81 mg via ORAL
  Filled 2011-07-11 (×3): qty 1

## 2011-07-11 MED ORDER — POTASSIUM CHLORIDE IN NACL 20-0.9 MEQ/L-% IV SOLN
INTRAVENOUS | Status: AC
  Administered 2011-07-11 – 2011-07-12 (×2): via INTRAVENOUS
  Administered 2011-07-13 (×2): 75 mL via INTRAVENOUS

## 2011-07-11 MED ORDER — NICOTINE 14 MG/24HR TD PT24
14.0000 mg | MEDICATED_PATCH | Freq: Every day | TRANSDERMAL | Status: DC
  Administered 2011-07-12 – 2011-07-13 (×2): 14 mg via TRANSDERMAL
  Filled 2011-07-11 (×6): qty 1

## 2011-07-11 MED ORDER — PANTOPRAZOLE SODIUM 40 MG PO TBEC
80.0000 mg | DELAYED_RELEASE_TABLET | Freq: Every day | ORAL | Status: DC
  Administered 2011-07-11 – 2011-07-14 (×4): 80 mg via ORAL
  Filled 2011-07-11 (×4): qty 2
  Filled 2011-07-11: qty 1

## 2011-07-11 MED ORDER — ENOXAPARIN SODIUM 40 MG/0.4ML ~~LOC~~ SOLN
40.0000 mg | SUBCUTANEOUS | Status: DC
  Administered 2011-07-11 – 2011-07-13 (×3): 40 mg via SUBCUTANEOUS
  Filled 2011-07-11 (×3): qty 0.4

## 2011-07-11 MED ORDER — ROFLUMILAST 500 MCG PO TABS
500.0000 ug | ORAL_TABLET | Freq: Every day | ORAL | Status: DC
  Administered 2011-07-12 – 2011-07-14 (×3): 500 ug via ORAL
  Filled 2011-07-11 (×6): qty 1
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
## 2011-07-11 NOTE — H&P (Signed)
PCP:   Dorrene German, MD, MD   Chief Complaint:  SOB for 2 days.  HPI: Ms Rhody was managed recently for acute respiratory failure requiring vent support. She states she felt better at d/c, but again developed sob when she went bac to her cold apartment. She denies fever or chest pain. Patient found to be in another acute exacerbation of COPD, hence referral for admission. She feels better after receiving bronchodilators/steroids/O2. However, she seems quite dyspneic at time of my evaluation. She continues to smoke.  Review of Systems:  Unremarkable except high lighted in hpi. Past Medical History: Past Medical History  Diagnosis Date  . COPD (chronic obstructive pulmonary disease)   . Asthma   . Bronchitis   . Anxiety   . Back pain   . Demand ischemia of myocardium 06/28/2011  . Thyroid enlargement 06/28/2011  . MI, acute, non ST segment elevation 06/29/2011    Myoview stress test revealed mild perfusion defect  . Ischemic cardiomyopathy 06/28/2011    EF 25%. Myoview stress test later showed ejection fraction of 65%  . PNA (pneumonia) 06/29/2011  . Acute respiratory failure 06/2011    Vent dependent sec to COPD/PNA   Past Surgical History  Procedure Date  . Abdominal hysterectomy     Medications: Prior to Admission medications   Medication Sig Start Date End Date Taking? Authorizing Provider  albuterol (PROVENTIL HFA;VENTOLIN HFA) 108 (90 BASE) MCG/ACT inhaler Inhale 1-2 puffs into the lungs every 6 (six) hours as needed for wheezing. 01/12/11 01/12/12  [name], MD  albuterol (PROVENTIL) (2.5 MG/3ML) 0.083% nebulizer solution Take 2.5 mg by nebulization every 6 (six) hours as needed.      Historical Provider, MD  ALPRAZolam [name]) 0.5 MG tablet Take 0.5 mg by mouth 4 (four) times daily as needed.     Historical Provider, MD  aspirin EC 81 MG tablet Take 1 tablet (81 mg total) by mouth daily. 07/05/11 07/04/12  Elliot Cousin, MD  cyclobenzaprine (FLEXERIL) 10 MG tablet Take 5 mg by  mouth 2 (two) times daily.     Historical Provider, MD  dexlansoprazole (DEXILANT) 60 MG capsule Take 60 mg by mouth daily.    Historical Provider, MD  Fluticasone-Salmeterol (ADVAIR) 250-50 MCG/DOSE AEPB Inhale 1 puff into the lungs every 12 (twelve) hours.      Historical Provider, MD  gabapentin (NEURONTIN) 300 MG capsule Take 300 mg by mouth 2 (two) times daily.      Historical Provider, MD  OVER THE COUNTER MEDICATION Take 1 capsule by mouth every morning. **Calcium 600 + Vitamin D 1200 Combination Tablet**    Historical Provider, MD  oxyCODONE-acetaminophen (PERCOCET) 5-325 MG per tablet Take 0.5 tablets by mouth every 4 (four) hours as needed. Back spurs    Historical Provider, MD  PARoxetine (PAXIL) 30 MG tablet Take 30 mg by mouth every morning.      Historical Provider, MD  polyethylene glycol powder (GLYCOLAX/MIRALAX) powder Take 17 g by mouth daily.    Historical Provider, MD  roflumilast (DALIRESP) 500 MCG TABS tablet Take 500 mcg by mouth daily.    Historical Provider, MD  rosuvastatin (CRESTOR) 10 MG tablet Take 1 tablet (10 mg total) by mouth daily at 6 PM. FOR YOUR HEART AND TO LOWER CHOLESTEROL. 07/05/11 07/04/12  Elliot Cousin, MD  tiotropium (SPIRIVA) 18 MCG inhalation capsule Place 18 mcg into inhaler and inhale daily.      Historical Provider, MD  traZODone (DESYREL) 100 MG tablet Take 100 mg by mouth  at bedtime.     Historical Provider, MD  zolpidem (AMBIEN) 5 MG tablet Take 2 tablets (10 mg total) by mouth at bedtime as needed for sleep. 01/12/11 01/12/12  [name], MD    Allergies:  No Known Allergies  Social History:  reports that she quit smoking about 7 months ago. She does not have any smokeless tobacco history on file. She reports that she does not drink alcohol or use illicit drugs.   Family History: Family History  Problem Relation Age of Onset  . Diabetes Mother   . Diabetes Father   . Hypertension Mother   . Hypertension Father     Physical Exam: Filed  Vitals:   07/11/11 1737 07/11/11 1742 07/11/11 1829 07/11/11 1835  BP:  138/83  118/81  Pulse:  111  112  Temp:  98.2 F (36.8 C)  98.5 F (36.9 C)  TempSrc:  Oral    Resp:  32  18  Height: 5\' 1"  (1.549 m)     Weight: 63.504 kg (140 lb)     SpO2:  100% 99% 100%   In mild to moderate respiratory distress. Reduced air entry bilaterally, with scattered wheezing. S1S2. No murmurs. RRR. Abdomen- some fullness related to increased abdominal pressure . +BS. CNS- grossly intact. Extremities- no pedal edema. Peripheral pulses equal.   Labs on Admission:   [facility] 07/11/11 1753  NA 142  K 4.4  CL 103  CO2 32  GLUCOSE 108*  BUN 8  CREATININE 0.86  CALCIUM 9.6  MG --  PHOS --   No results found for this basename: AST:2,ALT:2,ALKPHOS:2,BILITOT:2,PROT:2,ALBUMIN:2 in the last 72 hours No results found for this basename: LIPASE:2,AMYLASE:2 in the last 72 hours  Basename 07/11/11 1753  WBC 9.8  NEUTROABS 5.3  HGB 14.0  HCT 43.3  MCV 90.2  PLT 228   No results found for this basename: CKTOTAL:3,CKMB:3,CKMBINDEX:3,TROPONINI:3 in the last 72 hours No results found for this basename: TSH,T4TOTAL,FREET3,T3FREE,THYROIDAB in the last 72 hours No results found for this basename: VITAMINB12:2,FOLATE:2,FERRITIN:2,TIBC:2,IRON:2,RETICCTPCT:2 in the last 72 hours  Radiological Exams on Admission: Ct Angio Chest W/cm &/or Wo Cm  06/28/2011  *RADIOLOGY REPORT*  Clinical Data: Acute respiratory failure; elevated D-dimer. History of asthma.  CT ANGIOGRAPHY CHEST  Technique:  Multidetector CT imaging of the chest using the standard protocol during bolus administration of intravenous contrast. Multiplanar reconstructed images including MIPs were obtained and reviewed to evaluate the vascular anatomy.  Contrast: OMNIPAQUE IOHEXOL 350 MG/ML IV SOLN  Comparison: Chest radiograph performed 06/27/2011, and CTA of the chest performed 01/08/2010  Findings: There is no evidence of pulmonary embolus.  Mild  patchy airspace opacity is noted along the medial aspect of the right upper lobe, and minimally in the right middle lobe in a peribronchial distribution.  There is diffuse bilateral peribronchial soft tissue thickening; this may reflect the patient's history of asthma.  Mild bilateral dependent subsegmental atelectasis is noted.  There is no evidence of pleural effusion or pneumothorax.  No masses are identified; no abnormal focal contrast enhancement is seen.  Diffuse coronary artery calcifications are seen.  The mediastinum is otherwise unremarkable in appearance.  Scattered small periaortic, aortopulmonary window and right paratracheal nodes remain borderline normal in size, without definite evidence for mediastinal lymphadenopathy.  The patient's enteric tube is noted extending to the body of the stomach.  The endotracheal tube is seen ending 2 cm above the carina.  No pericardial effusion is seen.  The great vessels are unremarkable in appearance.  No axillary lymphadenopathy is seen. There is vague asymmetric enlargement of the right thyroid lobe, with scattered calcification; this appears grossly stable from the prior CTA performed 2011, though correlation with prior lab findings or thyroid ultrasound would be helpful.  The visualized portions of the liver and spleen are unremarkable. The visualized portions of the pancreas, stomach, adrenal glands and kidneys are within normal limits.  No acute osseous abnormalities are seen.  A lucent lesion within the L1 vertebral body was seen to reflect a benign vertebral body hemangioma on the prior MRI of the lumbar spine.  Degenerative change is noted at the upper lumbar spine, with diffuse sclerosis.  IMPRESSION:  1.  No evidence of pulmonary embolus. 2.  Mild patchy airspace opacity at the medial aspect of the right upper lobe, and in a peribronchial distribution in the right middle lobe.  This raises concern for mild pneumonia, possibly atypical in nature. 3.   Diffuse bilateral peribronchial soft tissue thickening; this may reflect the patient's history of asthma, but appears worsened from the prior CTA. 4.  Mild bilateral dependent subsegmental atelectasis noted. 5.  Diffuse coronary artery calcifications seen. 6.  Vague asymmetric enlargement of the right thyroid lobe, with scattered calcification.  This appears grossly stable from the CTA performed in 2011, but correlation with prior lab findings or thyroid ultrasound would be helpful.  Original Report Authenticated By: Tonia Ghent, M.D.   Nm Myocar Single W/spect W/wall Motion And Ef  07/04/2011  This examination was dictated by the cardiologist who supervised the examination.  Please see that report for details.  nm myoview pharmacologic stress  Ordering Physician: Joni Reining  Reading Physician: Clovis Bing  Clinical Data: 61 year old woman admitted to hospital with respiratory failure and pneumonia; cardiac markers suggest modest myocardial necrosis.  NUCLEAR MEDICINE ADENOSINE STRESS MYOVIEW STUDY WITH SPECT AND LEFT VENTRIUCLAR EJECTION FRACTION  Radionuclide Data: One-day rest/stress protocol performed with 10/30 mCi of Tc-32m Myoview.  Stress Data: Regadenoson infusion resulted in dizziness and malaise.  There was no change in blood pressure from an initial value of 88/50, but heart rate increased moderately following drug administration.  No arrhythmias noted.  EKG: Normal sinus rhythm; low voltage in the chest leads; borderline QT prolongation; T-wave abnormality consistent with inferolateral ischemia.  No significant change following Regadenoson infusion.  Scintigraphic Data: Acquisition notable for mild breast and diaphragmatic attenuation.  There was mild subdiaphragmatic activity adjacent to the inferior myocardium.  Left ventricular size was normal.  On tomographic images reconstructed in standard planes, there was a minimal anteroapical defect with an intensity that was of borderline  numeric significance by quantitative analysis.  By comparison to the resting portion of the study, no reversibility was apparent.  The gated reconstruction demonstrated normal to hyperdynamic regional and global LV systolic function as well as normal systolic accentuation of activity throughout. Estimated ejection fraction exceeded 65%.  IMPRESSION: Low risk pharmacologic stress nuclear myocardial study revealing no significant stress-induced EKG abnormalities, normal left ventricular size and normal left ventricular systolic function.  A minor perfusion defect was present as described above, which may represent superimposed breast attenuation and physiologic apical thinning.  No convincing evidence for ischemia or infarction.  Original Report Authenticated By: [email or web address]   Dg Chest Portable 1 View  07/11/2011  *RADIOLOGY REPORT*  Clinical Data: Shortness of breath.  PORTABLE CHEST - 1 VIEW  Comparison: 06/29/2011  Findings: Heart size and vascularity are normal and the lungs are clear of acute infiltrates and effusions.  No  effusions.  No osseous abnormality.  IMPRESSION: No acute abnormalities.  Original Report Authenticated By: Gwynn Burly, M.D.   Dg Chest Port 1 View  06/29/2011  *RADIOLOGY REPORT*  Clinical Data: Ventilator dependent respiratory failure.  Follow up left basilar atelectasis and/or pneumonia.  PORTABLE CHEST - 1 VIEW 06/29/2011 0525 hours:  Comparison: Portable chest x-rays yesterday, 06/27/2011, and 10/27/[facility].  CTA chest yesterday.  Findings: Endotracheal tube tip approximately 2-3 cm above the carina.  Interval improvement in the opacities at the left lung base, with mild linear atelectasis persisting.  Mildly prominent bronchovascular markings diffusely, unchanged.  No new pulmonary parenchymal abnormalities.  Nasogastric courses below the diaphragm into the stomach but its tip is not included on the image.  Cardiac silhouette normal in size for the AP portable  technique.  IMPRESSION: Support apparatus satisfactory.  Improved aeration of the left lung base, with mild atelectasis persisting.  Stable mild changes of chronic bronchitis/asthma.  No new abnormalities.  Original Report Authenticated By: Arnell Sieving, M.D.   Portable Chest Xray In Am  06/28/2011  *RADIOLOGY REPORT*  Clinical Data: Acute respiratory distress, on ventilator  PORTABLE CHEST - 1 VIEW  Comparison: Portable chest x-ray of 06/27/2011  Findings: The tip of the endotracheal tube is obscured by overlying electrode leads.  The tip appears to be approximately 1.5 cm above the carina.  Prominent markings are noted particularly at the left lung base and pneumonia cannot be excluded.  No effusion is seen. Heart size is stable.  IMPRESSION:  1.  Tip of endotracheal tube is obscured, probably approximately 1.5 cm above the carina. 2. Prominent markings at the left lung base suspicious for pneumonia.  Original Report Authenticated By: Juline Patch, M.D.   Dg Chest Port 1 View  06/27/2011  *RADIOLOGY REPORT*  Clinical Data: Intubated.  Smoker.  COPD.  Dyspnea.  PORTABLE CHEST - 1 VIEW  Comparison: 05/26/2011.  Findings: Endotracheal tube in satisfactory position.  Nasogastric tube extending into the stomach.  Stable normal sized heart.  The lungs are clear and mildly hyperexpanded with stable mildly prominent interstitial markings.  Unremarkable bones.  IMPRESSION:  1.  Endotracheal tube in satisfactory position. 2.  COPD.  Original Report Authenticated By: Darrol Angel, M.D.    Assessment Acute exacerbation of COPD in 61 year old female who required vent support for same  Recently. She felt better but went back to her unheated apartment, hence started experiencing SOB and wheezing again. She has an acute exacerbation of COPD, with obvious wheezing. She is retaining CO2, perhaps added element of narcotics/benzos. Plan .COPD (chronic obstructive pulmonary disease) with acute bronchitis- admit  SDU on acute BIPAP. Will place on abx- vanc/levaquin/steroids/broncodilators.  Marland KitchenHTN (hypertension)- monitor BP, as on the low side. .Tobacco abuse- cessation counseling given. Nicotine patch. .Anxiety disorder- seems a bit anxious at time of evaluation. Continue xanax as at home. Dvt/gi prophylaxis. Condition critical. Time spent 50 minutes.   Azlynn Mitnick [phone]. 07/11/2011, 9:00 PM

## 2011-07-12 LAB — COMPREHENSIVE METABOLIC PANEL
ALT: 29 U/L (ref 0–35)
AST: 11 U/L (ref 0–37)
Albumin: 2.9 g/dL — ABNORMAL LOW (ref 3.5–5.2)
Alkaline Phosphatase: 97 U/L (ref 39–117)
BUN: 9 mg/dL (ref 6–23)
CO2: 28 mEq/L (ref 19–32)
Calcium: 9.8 mg/dL (ref 8.4–10.5)
Chloride: 105 mEq/L (ref 96–112)
Creatinine, Ser: 0.75 mg/dL (ref 0.50–1.10)
GFR calc Af Amer: >90 mL/min (ref >90)
GFR calc non Af Amer: >90 mL/min (ref >90)
Glucose, Bld: 201 mg/dL — ABNORMAL HIGH (ref 70–99)
Potassium: 5 mEq/L (ref 3.5–5.1)
Sodium: 140 mEq/L (ref 135–145)
Total Bilirubin: 0.3 mg/dL (ref 0.3–1.2)
Total Protein: 6.7 g/dL (ref 6.0–8.3)

## 2011-07-12 LAB — CBC
HCT: 38.9 % (ref 36.0–46.0)
Hemoglobin: 12.5 g/dL (ref 12.0–15.0)
MCH: 28.7 pg (ref 26.0–34.0)
MCHC: 32.1 g/dL (ref 30.0–36.0)
MCV: 89.4 fL (ref 78.0–100.0)
Platelets: 228 10*3/uL (ref 150–400)
RBC: 4.35 MIL/uL (ref 3.87–5.11)
RDW: 14.8 % (ref 11.5–15.5)
WBC: 8.4 10*3/uL (ref 4.0–10.5)

## 2011-07-12 LAB — PROTIME-INR
INR: 1.03 (ref 0.00–1.49)
Prothrombin Time: 13.7 seconds (ref 11.6–15.2)

## 2011-07-12 LAB — MRSA PCR SCREENING: MRSA by PCR: NEGATIVE

## 2011-07-12 LAB — CARDIAC PANEL(CRET KIN+CKTOT+MB+TROPI)
CK, MB: 2.6 ng/mL (ref 0.3–4.0)
CK, MB: 2.7 ng/mL (ref 0.3–4.0)
Relative Index: INVALID (ref 0.0–2.5)
Relative Index: INVALID (ref 0.0–2.5)
Total CK: 26 U/L (ref 7–177)
Total CK: 37 U/L (ref 7–177)
Troponin I: <0.3 ng/mL (ref <0.30)
Troponin I: <0.3 ng/mL (ref <0.30)

## 2011-07-12 LAB — APTT: aPTT: 32 seconds (ref 24–37)

## 2011-07-12 LAB — BLOOD GAS, ARTERIAL
Acid-Base Excess: 1.1 mmol/L (ref 0.0–2.0)
Bicarbonate: 26.2 mEq/L — ABNORMAL HIGH (ref 20.0–24.0)
Delivery systems: POSITIVE
Drawn by: 21694
Expiratory PAP: 8
Inspiratory PAP: 14
O2 Content: 35 L/min
O2 Saturation: 95.3 %
Patient temperature: 37
TCO2: 23.6 mmol/L (ref 0–100)
pCO2 arterial: 49.2 mmHg — ABNORMAL HIGH (ref 35.0–45.0)
pH, Arterial: 7.345 — ABNORMAL LOW (ref 7.350–7.400)
pO2, Arterial: 80.1 mmHg (ref 80.0–100.0)

## 2011-07-12 MED ORDER — ALBUTEROL SULFATE (5 MG/ML) 0.5% IN NEBU: 2.5000 mg | INHALATION_SOLUTION | RESPIRATORY_TRACT | Status: DC | PRN

## 2011-07-12 MED ORDER — VANCOMYCIN HCL IN DEXTROSE 1-5 GM/200ML-% IV SOLN
1000.0000 mg | Freq: Two times a day (BID) | INTRAVENOUS | Status: DC
  Filled 2011-07-12: qty 200

## 2011-07-12 MED ORDER — GUAIFENESIN ER 600 MG PO TB12
1200.0000 mg | ORAL_TABLET | Freq: Two times a day (BID) | ORAL | Status: DC
  Administered 2011-07-12 – 2011-07-14 (×5): 1200 mg via ORAL
  Filled 2011-07-12 (×4): qty 2

## 2011-07-12 MED ORDER — ALPRAZOLAM 0.25 MG PO TABS
0.2500 mg | ORAL_TABLET | Freq: Three times a day (TID) | ORAL | Status: DC | PRN
  Administered 2011-07-12 (×2): 0.25 mg via ORAL
  Filled 2011-07-12 (×2): qty 1

## 2011-07-12 NOTE — Consult Note (Signed)
ANTIBIOTIC CONSULT NOTE - INITIAL  Pharmacy Consult for Vancomycin Indication: rule out pneumonia (hcap)  No Known Allergies  Patient Measurements: Height: 5\' 1"  (154.9 cm) Weight: 144 lb 10 oz (65.6 kg) IBW/kg (Calculated) : 47.8   Vital Signs: Temp: 97.5 F (36.4 C) (03/05 0400) Temp src: Axillary (03/05 0000) BP: 86/64 mmHg (03/05 0600) Pulse Rate: 87  (03/05 0600) Intake/Output from previous day: 03/04 0701 - 03/05 0700 In: 1343.8 [P.O.:480; I.V.:513.8; IV Piggyback:350] Out: -  Intake/Output from this shift:    Labs:  [facility] 07/12/11 0648 07/11/11 1753  WBC 8.4 9.8  HGB 12.5 14.0  PLT 228 228  LABCREA -- --  CREATININE -- 0.86   Estimated Creatinine Clearance: 61 ml/min (by C-G formula based on Cr of 0.86). No results found for this basename: VANCOTROUGH:2,VANCOPEAK:2,VANCORANDOM:2,GENTTROUGH:2,GENTPEAK:2,GENTRANDOM:2,TOBRATROUGH:2,TOBRAPEAK:2,TOBRARND:2,AMIKACINPEAK:2,AMIKACINTROU:2,AMIKACIN:2, in the last 72 hours   Microbiology: Recent Results (from the past 720 hour(s))  CULTURE, BLOOD (ROUTINE X 2)     Status: Normal   Collection Time   06/27/11  6:20 PM      Component Value Range Status Comment   Specimen Description BLOOD RIGHT HAND   Final    Special Requests BOTTLES DRAWN AEROBIC AND ANAEROBIC 6CC   Final    Culture NO GROWTH 5 DAYS   Final    Report Status 07/02/2011 FINAL   Final   CULTURE, BLOOD (ROUTINE X 2)     Status: Normal   Collection Time   06/27/11  6:40 PM      Component Value Range Status Comment   Specimen Description BLOOD RIGHT ANTECUBITAL   Final    Special Requests BOTTLES DRAWN AEROBIC AND ANAEROBIC 8CC   Final    Culture NO GROWTH 5 DAYS   Final    Report Status 07/02/2011 FINAL   Final   URINE CULTURE     Status: Normal   Collection Time   06/27/11  6:53 PM      Component Value Range Status Comment   Specimen Description URINE, CATHETERIZED   Final    Special Requests NONE   Final    Culture  Setup Time [id number]    Final    Colony Count NO GROWTH   Final    Culture NO GROWTH   Final    Report Status 06/29/2011 FINAL   Final   MRSA PCR SCREENING     Status: Normal   Collection Time   06/27/11 11:42 PM      Component Value Range Status Comment   MRSA by PCR NEGATIVE  NEGATIVE  Final   MRSA PCR SCREENING     Status: Normal   Collection Time   07/11/11 10:43 PM      Component Value Range Status Comment   MRSA by PCR NEGATIVE  NEGATIVE  Final     Medical History: Past Medical History  Diagnosis Date  . COPD (chronic obstructive pulmonary disease)   . Asthma   . Bronchitis   . Anxiety   . Back pain   . Demand ischemia of myocardium 06/28/2011  . Thyroid enlargement 06/28/2011  . MI, acute, non ST segment elevation 06/29/2011    Myoview stress test revealed mild perfusion defect  . Ischemic cardiomyopathy 06/28/2011    EF 25%. Myoview stress test later showed ejection fraction of 65%  . PNA (pneumonia) 06/29/2011  . Acute respiratory failure 06/2011    Vent dependent sec to COPD/PNA    Medications:  Scheduled:    . albuterol  2.5 mg Nebulization Q6H  .  albuterol  10 mg/hr Nebulization Once  . aspirin EC  81 mg Oral Daily  . atorvastatin  20 mg Oral q1800  . enoxaparin  40 mg Subcutaneous Q24H  . gabapentin  300 mg Oral BID  . levofloxacin (LEVAQUIN) IV  750 mg Intravenous Q24H  . methylPREDNISolone (SOLU-MEDROL) injection  125 mg Intravenous Once  . methylPREDNISolone (SOLU-MEDROL) injection  125 mg Intravenous Q6H  . nicotine  14 mg Transdermal Daily  . pantoprazole  80 mg Oral Q1200  . PARoxetine  30 mg Oral Daily  . roflumilast  500 mcg Oral Daily  . tiotropium  18 mcg Inhalation Daily  . traZODone  100 mg Oral QHS  . vancomycin  1,000 mg Intravenous Once  . vancomycin  1,000 mg Intravenous Q12H  . DISCONTD: albuterol  5 mg/hr Nebulization Once   Assessment: Renal fxn OK  Goal of Therapy:  Vancomycin trough level 15-20 mcg/ml  Plan: Vancomycin 1gm iv q12hrs Check trough at  steady [address] Yuridiana Formanek A 07/12/2011,7:36 AM

## 2011-07-12 NOTE — Progress Notes (Signed)
Subjective: Feels better today, breathing feels better, no cough, no chest pain  Objective: Vital signs in last 24 hours: Temp:  [97.4 F (36.3 C)-98.5 F (36.9 C)] 97.5 F (36.4 C) (03/05 0400) Pulse Rate:  [78-117] 87  (03/05 0600) Resp:  [16-34] 19  (03/05 0600) BP: (86-138)/(58-83) 86/64 mmHg (03/05 0600) SpO2:  [90 %-100 %] 93 % (03/05 0755) FiO2 (%):  [35 %] 35 % (03/05 0755) Weight:  [63.1 kg (139 lb 1.8 oz)-65.6 kg (144 lb 10 oz)] 65.6 kg (144 lb 10 oz) (03/05 0500) Weight change:  Last BM Date: 07/10/11  Intake/Output from previous day: 03/04 0701 - 03/05 0700 In: 1343.8 [P.O.:480; I.V.:513.8; IV Piggyback:350] Out: -      Physical Exam: General: Alert, awake, oriented x3, in no acute distress. HEENT: No bruits, no goiter. Heart: s1, s2, tachycardic Lungs: Diminished breath sounds bilaterally Abdomen: Soft, nontender, nondistended, positive bowel sounds. Extremities: No clubbing cyanosis or edema with positive pedal pulses. Neuro: Grossly intact, nonfocal.    Lab Results: Basic Metabolic Panel:  Basename 07/12/11 0648 07/11/11 2235 07/11/11 1753  NA 140 -- 142  K 5.0 -- 4.4  CL 105 -- 103  CO2 28 -- 32  GLUCOSE 201* -- 108*  BUN 9 -- 8  CREATININE 0.75 -- 0.86  CALCIUM 9.8 -- 9.6  MG -- 2.2 --  PHOS -- 3.0 --   Liver Function Tests:  Basename 07/12/11 0648  AST 11  ALT 29  ALKPHOS 97  BILITOT 0.3  PROT 6.7  ALBUMIN 2.9*   No results found for this basename: LIPASE:2,AMYLASE:2 in the last 72 hours No results found for this basename: AMMONIA:2 in the last 72 hours CBC:  Basename 07/12/11 0648 07/11/11 1753  WBC 8.4 9.8  NEUTROABS -- 5.3  HGB 12.5 14.0  HCT 38.9 43.3  MCV 89.4 90.2  PLT 228 228   Cardiac Enzymes:  Basename 07/12/11 0648 07/11/11 2235  CKTOTAL 26 30  CKMB 2.6 2.4  CKMBINDEX -- --  TROPONINI <0.30 <0.30   BNP:  Basename 07/11/11 2235 07/11/11 1753  PROBNP 371.2* 266.0*   D-Dimer: No results found for this  basename: DDIMER:2 in the last 72 hours CBG: No results found for this basename: GLUCAP:6 in the last 72 hours Hemoglobin A1C: No results found for this basename: HGBA1C in the last 72 hours Fasting Lipid Panel: No results found for this basename: CHOL,HDL,LDLCALC,TRIG,CHOLHDL,LDLDIRECT in the last 72 hours Thyroid Function Tests: No results found for this basename: TSH,T4TOTAL,FREET4,T3FREE,THYROIDAB in the last 72 hours Anemia Panel: No results found for this basename: VITAMINB12,FOLATE,FERRITIN,TIBC,IRON,RETICCTPCT in the last 72 hours Coagulation:  Basename 07/12/11 0648  LABPROT 13.7  INR 1.03   Urine Drug Screen: Drugs of Abuse     Component Value Date/Time   LABOPIA NONE DETECTED 11/24/2006 1355   COCAINSCRNUR POSITIVE* 11/24/2006 1355   LABBENZ NONE DETECTED 11/24/2006 1355   AMPHETMU NONE DETECTED 11/24/2006 1355   THCU NONE DETECTED 11/24/2006 1355   LABBARB  Value: NONE DETECTED        DRUG SCREEN FOR MEDICAL PURPOSES ONLY.  IF CONFIRMATION IS NEEDED FOR ANY PURPOSE, NOTIFY LAB WITHIN 5 DAYS. 11/24/2006 1355    Alcohol Level: No results found for this basename: ETH:2 in the last 72 hours Urinalysis: No results found for this basename: COLORURINE:2,APPERANCEUR:2,LABSPEC:2,PHURINE:2,GLUCOSEU:2,HGBUR:2,BILIRUBINUR:2,KETONESUR:2,PROTEINUR:2,UROBILINOGEN:2,NITRITE:2,LEUKOCYTESUR:2 in the last 72 hours  Recent Results (from the past 240 hour(s))  MRSA PCR SCREENING     Status: Normal   Collection Time   07/11/11 10:43 PM  Component Value Range Status Comment   MRSA by PCR NEGATIVE  NEGATIVE  Final     Studies/Results: Dg Chest Portable 1 View  07/11/2011  *RADIOLOGY REPORT*  Clinical Data: Shortness of breath.  PORTABLE CHEST - 1 VIEW  Comparison: 06/29/2011  Findings: Heart size and vascularity are normal and the lungs are clear of acute infiltrates and effusions.  No effusions.  No osseous abnormality.  IMPRESSION: No acute abnormalities.  Original Report Authenticated  By: Gwynn Burly, M.D.    Medications: Scheduled Meds:   . albuterol  2.5 mg Nebulization Q6H  . albuterol  10 mg/hr Nebulization Once  . aspirin EC  81 mg Oral Daily  . atorvastatin  20 mg Oral q1800  . enoxaparin  40 mg Subcutaneous Q24H  . gabapentin  300 mg Oral BID  . levofloxacin (LEVAQUIN) IV  750 mg Intravenous Q24H  . methylPREDNISolone (SOLU-MEDROL) injection  125 mg Intravenous Once  . methylPREDNISolone (SOLU-MEDROL) injection  125 mg Intravenous Q6H  . nicotine  14 mg Transdermal Daily  . pantoprazole  80 mg Oral Q1200  . PARoxetine  30 mg Oral Daily  . roflumilast  500 mcg Oral Daily  . tiotropium  18 mcg Inhalation Daily  . traZODone  100 mg Oral QHS  . vancomycin  1,000 mg Intravenous Once  . vancomycin  1,000 mg Intravenous Q12H  . DISCONTD: albuterol  5 mg/hr Nebulization Once   Continuous Infusions:   . 0.9 % NaCl with KCl 20 mEq / L 75 mL/hr at 07/12/11 0600   PRN Meds:.acetaminophen, acetaminophen, morphine  Assessment/Plan:  1. Acute respiratory failure secondary to COPD exacerbation. Continue steroids, antibiotics, nebs treatments. Continue supplemental oxygen. Currently on 3 L, we'll wean down as tolerated. Her x-rays show any evidence of pneumonia. She reports that her symptoms were exacerbated when she was exposed to secondhand smoke. Likely anxiety is also playing a role here.  2. COPD exacerbation. Please see above  3. Hyperlipidemia. On statin.  4. Anxiety. Continue outpatient medications  5. Hypotension. Unclear etiology. Patient does not appear toxic. Continue IV fluids for now. We'll continue to monitor. Followup cultures.  6. Disposition. Continue to monitor for now. If remains stable then possible transfer to floor tomorrow.   LOS: 1 day   Mehtab Dolberry [facility] Pager: [phone] 07/12/2011, 9:09 AM

## 2011-07-13 LAB — URINALYSIS, ROUTINE W REFLEX MICROSCOPIC
Bilirubin Urine: NEGATIVE
Glucose, UA: 250 mg/dL — AB
Ketones, ur: NEGATIVE mg/dL
Leukocytes, UA: NEGATIVE
Nitrite: NEGATIVE
Protein, ur: NEGATIVE mg/dL
Specific Gravity, Urine: <1.005 — ABNORMAL LOW (ref 1.005–1.030)
Urobilinogen, UA: 0.2 mg/dL (ref 0.0–1.0)
pH: 5.5 (ref 5.0–8.0)

## 2011-07-13 LAB — BASIC METABOLIC PANEL
BUN: 13 mg/dL (ref 6–23)
CO2: 28 mEq/L (ref 19–32)
Calcium: 9.7 mg/dL (ref 8.4–10.5)
Chloride: 104 mEq/L (ref 96–112)
Creatinine, Ser: 0.73 mg/dL (ref 0.50–1.10)
GFR calc Af Amer: >90 mL/min (ref >90)
GFR calc non Af Amer: >90 mL/min (ref >90)
Glucose, Bld: 249 mg/dL — ABNORMAL HIGH (ref 70–99)
Potassium: 4.2 mEq/L (ref 3.5–5.1)
Sodium: 140 mEq/L (ref 135–145)

## 2011-07-13 LAB — CBC
HCT: 38 % (ref 36.0–46.0)
Hemoglobin: 12.4 g/dL (ref 12.0–15.0)
MCH: 29 pg (ref 26.0–34.0)
MCHC: 32.6 g/dL (ref 30.0–36.0)
MCV: 88.8 fL (ref 78.0–100.0)
Platelets: 222 10*3/uL (ref 150–400)
RBC: 4.28 MIL/uL (ref 3.87–5.11)
RDW: 14.9 % (ref 11.5–15.5)
WBC: 13.8 10*3/uL — ABNORMAL HIGH (ref 4.0–10.5)

## 2011-07-13 LAB — URINE CULTURE
Colony Count: NO GROWTH
Culture  Setup Time: 201303060150
Culture: NO GROWTH

## 2011-07-13 LAB — URINE MICROSCOPIC-ADD ON

## 2011-07-13 MED ORDER — PREDNISONE 20 MG PO TABS
50.0000 mg | ORAL_TABLET | Freq: Every day | ORAL | Status: DC
  Administered 2011-07-14: 50 mg via ORAL
  Filled 2011-07-13: qty 1

## 2011-07-13 MED ORDER — ALPRAZOLAM 0.5 MG PO TABS
0.5000 mg | ORAL_TABLET | Freq: Three times a day (TID) | ORAL | Status: DC | PRN
  Administered 2011-07-13 – 2011-07-14 (×3): 0.5 mg via ORAL
  Filled 2011-07-13 (×3): qty 1

## 2011-07-13 MED ORDER — LEVOFLOXACIN 500 MG PO TABS
750.0000 mg | ORAL_TABLET | Freq: Every day | ORAL | Status: DC
  Administered 2011-07-13 – 2011-07-14 (×2): 750 mg via ORAL
  Filled 2011-07-13: qty 1
  Filled 2011-07-13: qty 3

## 2011-07-13 NOTE — Progress Notes (Signed)
Inpatient Diabetes Program Recommendations  AACE/ADA: New Consensus Statement on Inpatient Glycemic Control (2009)  Target Ranges:  Prepandial:   less than 140 mg/dL      Peak postprandial:   less than 180 mg/dL (1-2 hours)      Critically ill patients:  140 - 180 mg/dL   Reason for Visit: Steroid-Induced Hyperglycemia: 249 mg/dL  Inpatient Diabetes Program Recommendations Correction (SSI): Add Novolog Correction while on IV steroids

## 2011-07-13 NOTE — Progress Notes (Signed)
Subjective: Feeling better today, appears to be very anxious  Objective: Vital signs in last 24 hours: Temp:  [97.1 F (36.2 C)-98.3 F (36.8 C)] 98.1 F (36.7 C) (03/06 0800) Pulse Rate:  [67-115] 98  (03/06 0800) Resp:  [14-24] 21  (03/06 0800) BP: (67-138)/(44-85) 110/66 mmHg (03/06 0800) SpO2:  [91 %-99 %] 95 % (03/06 0800) FiO2 (%):  [30 %] 30 % (03/06 0747) Weight:  [66.8 kg (147 lb 4.3 oz)] 66.8 kg (147 lb 4.3 oz) (03/06 0400) Weight change: 3.297 kg (7 lb 4.3 oz) Last BM Date: 07/11/11  Intake/Output from previous day: 03/05 0701 - 03/06 0700 In: 1650 [I.V.:1650] Out: 4200 [Urine:4200] Total I/O In: 360 [P.O.:360] Out: 1200 [Urine:1200]   Physical Exam: General: Alert, awake, oriented x3, in no acute distress. HEENT: No bruits, no goiter. Heart: Regular rate and rhythm, without murmurs, rubs, gallops. Lungs: diminished breath sounds at bases. Abdomen: Soft, nontender, nondistended, positive bowel sounds. Extremities: No clubbing cyanosis or edema with positive pedal pulses. Neuro: Grossly intact, nonfocal.    Lab Results: Basic Metabolic Panel:  Basename 07/13/11 0509 07/12/11 0648 07/11/11 2235  NA 140 140 --  K 4.2 5.0 --  CL 104 105 --  CO2 28 28 --  GLUCOSE 249* 201* --  BUN 13 9 --  CREATININE 0.73 0.75 --  CALCIUM 9.7 9.8 --  MG -- -- 2.2  PHOS -- -- 3.0   Liver Function Tests:  Basename 07/12/11 0648  AST 11  ALT 29  ALKPHOS 97  BILITOT 0.3  PROT 6.7  ALBUMIN 2.9*   No results found for this basename: LIPASE:2,AMYLASE:2 in the last 72 hours No results found for this basename: AMMONIA:2 in the last 72 hours CBC:  Basename 07/13/11 0509 07/12/11 0648 07/11/11 1753  WBC 13.8* 8.4 --  NEUTROABS -- -- 5.3  HGB 12.4 12.5 --  HCT 38.0 38.9 --  MCV 88.8 89.4 --  PLT 222 228 --   Cardiac Enzymes:  Basename 07/12/11 1356 07/12/11 0648 07/11/11 2235  CKTOTAL 37 26 30  CKMB 2.7 2.6 2.4  CKMBINDEX -- -- --  TROPONINI <0.30 <0.30 <0.30    BNP:  Basename 07/11/11 2235 07/11/11 1753  PROBNP 371.2* 266.0*   D-Dimer: No results found for this basename: DDIMER:2 in the last 72 hours CBG: No results found for this basename: GLUCAP:6 in the last 72 hours Hemoglobin A1C: No results found for this basename: HGBA1C in the last 72 hours Fasting Lipid Panel: No results found for this basename: CHOL,HDL,LDLCALC,TRIG,CHOLHDL,LDLDIRECT in the last 72 hours Thyroid Function Tests: No results found for this basename: TSH,T4TOTAL,FREET4,T3FREE,THYROIDAB in the last 72 hours Anemia Panel: No results found for this basename: VITAMINB12,FOLATE,FERRITIN,TIBC,IRON,RETICCTPCT in the last 72 hours Coagulation:  Basename 07/12/11 0648  LABPROT 13.7  INR 1.03   Urine Drug Screen: Drugs of Abuse     Component Value Date/Time   LABOPIA NONE DETECTED 11/24/2006 1355   COCAINSCRNUR POSITIVE* 11/24/2006 1355   LABBENZ NONE DETECTED 11/24/2006 1355   AMPHETMU NONE DETECTED 11/24/2006 1355   THCU NONE DETECTED 11/24/2006 1355   LABBARB  Value: NONE DETECTED        DRUG SCREEN FOR MEDICAL PURPOSES ONLY.  IF CONFIRMATION IS NEEDED FOR ANY PURPOSE, NOTIFY LAB WITHIN 5 DAYS. 11/24/2006 1355    Alcohol Level: No results found for this basename: ETH:2 in the last 72 hours Urinalysis:  Basename 07/13/11 0056  COLORURINE STRAW*  LABSPEC <1.005*  PHURINE 5.5  GLUCOSEU 250*  HGBUR TRACE*  BILIRUBINUR NEGATIVE  KETONESUR NEGATIVE  PROTEINUR NEGATIVE  UROBILINOGEN 0.2  NITRITE NEGATIVE  LEUKOCYTESUR NEGATIVE    Recent Results (from the past 240 hour(s))  MRSA PCR SCREENING     Status: Normal   Collection Time   07/11/11 10:43 PM      Component Value Range Status Comment   MRSA by PCR NEGATIVE  NEGATIVE  Final     Studies/Results: Dg Chest Portable 1 View  07/11/2011  *RADIOLOGY REPORT*  Clinical Data: Shortness of breath.  PORTABLE CHEST - 1 VIEW  Comparison: 06/29/2011  Findings: Heart size and vascularity are normal and the lungs are  clear of acute infiltrates and effusions.  No effusions.  No osseous abnormality.  IMPRESSION: No acute abnormalities.  Original Report Authenticated By: Gwynn Burly, M.D.    Medications: Scheduled Meds:   . albuterol  2.5 mg Nebulization Q6H  . aspirin EC  81 mg Oral Daily  . atorvastatin  20 mg Oral q1800  . enoxaparin  40 mg Subcutaneous Q24H  . gabapentin  300 mg Oral BID  . guaiFENesin  1,200 mg Oral BID  . levofloxacin (LEVAQUIN) IV  750 mg Intravenous Q24H  . methylPREDNISolone (SOLU-MEDROL) injection  125 mg Intravenous Q6H  . nicotine  14 mg Transdermal Daily  . pantoprazole  80 mg Oral Q1200  . PARoxetine  30 mg Oral Daily  . roflumilast  500 mcg Oral Daily  . tiotropium  18 mcg Inhalation Daily  . traZODone  100 mg Oral QHS  . DISCONTD: vancomycin  1,000 mg Intravenous Q12H   Continuous Infusions:   . 0.9 % NaCl with KCl 20 mEq / L 75 mL/hr at 07/13/11 0500   PRN Meds:.acetaminophen, acetaminophen, albuterol, ALPRAZolam, morphine  Assessment/Plan:  1. Acute respiratory failure secondary to COPD exacerbation. Continue antibiotics, nebs treatments. Start to wean steroids, wean oxygen as tolerated, ambulate patient. Her x-rays do not show any evidence of pneumonia. She reports that her symptoms were exacerbated when she was exposed to secondhand smoke. Likely anxiety is also playing a role here.   2. COPD exacerbation. Please see above   3. Hyperlipidemia. On statin.   4. Anxiety. Continue outpatient medications   5. Hypotension. Possibly related to dehydration, improved with IVF,   6. Disposition. Transfer to med surg floor, if continued improvement then possibly home tomorrow.    LOS: 2 days   Khaila Velarde [facility] Pager: [phone] 07/13/2011, 9:07 AM

## 2011-07-14 MED ORDER — ALPRAZOLAM 0.5 MG PO TABS: 0.5000 mg | ORAL_TABLET | Freq: Four times a day (QID) | ORAL | Status: DC | PRN

## 2011-07-14 MED ORDER — LEVOFLOXACIN 750 MG PO TABS: 750.0000 mg | ORAL_TABLET | Freq: Every day | ORAL | Status: AC

## 2011-07-14 MED ORDER — ROSUVASTATIN CALCIUM 10 MG PO TABS: 10.0000 mg | ORAL_TABLET | Freq: Every day | ORAL | Status: DC

## 2011-07-14 MED ORDER — FLUTICASONE-SALMETEROL 250-50 MCG/DOSE IN AEPB: 1.0000 | INHALATION_SPRAY | Freq: Two times a day (BID) | RESPIRATORY_TRACT | Status: DC

## 2011-07-14 MED ORDER — TIOTROPIUM BROMIDE MONOHYDRATE 18 MCG IN CAPS: 18.0000 ug | ORAL_CAPSULE | Freq: Every day | RESPIRATORY_TRACT | Status: DC

## 2011-07-14 MED ORDER — ALBUTEROL SULFATE (2.5 MG/3ML) 0.083% IN NEBU: 2.5000 mg | INHALATION_SOLUTION | Freq: Four times a day (QID) | RESPIRATORY_TRACT | Status: DC | PRN

## 2011-07-14 MED ORDER — GUAIFENESIN ER 600 MG PO TB12: 1200.0000 mg | ORAL_TABLET | Freq: Two times a day (BID) | ORAL | Status: DC

## 2011-07-14 MED ORDER — OXYCODONE-ACETAMINOPHEN 5-325 MG PO TABS: 0.5000 | ORAL_TABLET | ORAL | Status: DC | PRN

## 2011-07-14 MED ORDER — NICOTINE 14 MG/24HR TD PT24: 1.0000 | MEDICATED_PATCH | Freq: Every day | TRANSDERMAL | Status: AC

## 2011-07-14 MED ORDER — ROFLUMILAST 500 MCG PO TABS: 500.0000 ug | ORAL_TABLET | Freq: Every day | ORAL | Status: DC

## 2011-07-14 MED ORDER — CYCLOBENZAPRINE HCL 10 MG PO TABS: 5.0000 mg | ORAL_TABLET | Freq: Two times a day (BID) | ORAL | Status: DC

## 2011-07-14 MED ORDER — GABAPENTIN 300 MG PO CAPS: 300.0000 mg | ORAL_CAPSULE | Freq: Two times a day (BID) | ORAL | Status: DC

## 2011-07-14 MED ORDER — ZOLPIDEM TARTRATE 5 MG PO TABS: 10.0000 mg | ORAL_TABLET | Freq: Every evening | ORAL | Status: DC | PRN

## 2011-07-14 MED ORDER — DEXLANSOPRAZOLE 60 MG PO CPDR: 60.0000 mg | DELAYED_RELEASE_CAPSULE | Freq: Every day | ORAL | Status: DC

## 2011-07-14 MED ORDER — ASPIRIN EC 81 MG PO TBEC: 81.0000 mg | DELAYED_RELEASE_TABLET | Freq: Every day | ORAL | Status: AC

## 2011-07-14 MED ORDER — PREDNISONE 10 MG PO TABS: ORAL_TABLET | ORAL | Status: DC

## 2011-07-14 MED ORDER — TRAZODONE HCL 100 MG PO TABS: 50.0000 mg | ORAL_TABLET | Freq: Every day | ORAL | Status: DC

## 2011-07-14 MED ORDER — PAROXETINE HCL 30 MG PO TABS: 30.0000 mg | ORAL_TABLET | ORAL | Status: DC

## 2011-07-14 MED ORDER — POLYETHYLENE GLYCOL 3350 17 GM/SCOOP PO POWD: 17.0000 g | Freq: Every day | ORAL | Status: DC

## 2011-07-14 MED ORDER — ALBUTEROL SULFATE HFA 108 (90 BASE) MCG/ACT IN AERS: 1.0000 | INHALATION_SPRAY | Freq: Four times a day (QID) | RESPIRATORY_TRACT | Status: DC | PRN

## 2011-07-14 NOTE — Progress Notes (Signed)
CARE MANAGEMENT NOTE 07/14/2011  Patient:  PENNYE, BEEGHLY   Account Number:  [id number]  Date Initiated:  07/14/2011  Documentation initiated by:  Rosemary Holms  Subjective/Objective Assessment:   Pt admitted with SOB. PTA had [facility] Case Management with Alisa, Pt has all DME needed.     Action/Plan:   Pt to discharge back home with [facility] PT.   Anticipated DC Date:  07/14/2011   Anticipated DC Plan:  HOME W HOME HEALTH SERVICES      DC Planning Services  CM consult      Choice offered to / List presented to:          [facility] arranged  HH-2 PT      [facility] agency  [facility]   Status of service:  Completed, signed off Medicare Important Message given?   (If response is "NO", the following Medicare IM given date fields will be blank) Date Medicare IM given:   Date Additional Medicare IM given:    Discharge Disposition:  HOME W HOME HEALTH SERVICES  Per UR Regulation:    Comments:  07/14/11 1400 Lillyen Schow Leanord Hawking RN [facility]

## 2011-07-14 NOTE — Progress Notes (Signed)
Writer checked pt's O2 sat on RA it was 94% on RA.  Pt in no distress and no labored breathing observed.

## 2011-07-14 NOTE — Discharge Instructions (Signed)

## 2011-07-14 NOTE — Progress Notes (Signed)
Writer reviewed discharge instructions and medications, verbalized understanding.  Encouraged to call with any questions that may arise.  Pt took all belongings and even notebook binder in pt's bag.  Pt left in stable condition and in no distress.  Pt wheel chaired out in stable condition and in no distress.

## 2011-07-14 NOTE — Discharge Summary (Signed)
Physician Discharge Summary  Patient ID: Kristin Wells MRN: [id number] DOB/AGE: [birth year] 61 y.o.  Admit date: 07/11/2011 Discharge date: 07/14/2011  Primary Care Physician:  Dorrene German, MD, MD   Discharge Diagnoses:    Active Problems:  COPD (chronic obstructive pulmonary disease) with acute bronchitis  HTN (hypertension)  Tobacco abuse  Anxiety disorder Acute respiratory failure, improved GERD  Medication List  As of 07/14/2011  2:21 PM   TAKE these medications         albuterol (2.5 MG/3ML) 0.083% nebulizer solution   Commonly known as: PROVENTIL   Take 2.5 mg by nebulization every 6 (six) hours as needed.      albuterol 108 (90 BASE) MCG/ACT inhaler   Commonly known as: PROVENTIL HFA;VENTOLIN HFA   Inhale 1-2 puffs into the lungs every 6 (six) hours as needed for wheezing.      ALPRAZolam 0.5 MG tablet   Commonly known as: XANAX   Take 0.5 mg by mouth 4 (four) times daily as needed.      aspirin EC 81 MG tablet   Take 1 tablet (81 mg total) by mouth daily.      cyclobenzaprine 10 MG tablet   Commonly known as: FLEXERIL   Take 5 mg by mouth 2 (two) times daily.      DALIRESP 500 MCG Tabs tablet   Generic drug: roflumilast   Take 500 mcg by mouth daily.      DEXILANT 60 MG capsule   Generic drug: dexlansoprazole   Take 60 mg by mouth daily.      Fluticasone-Salmeterol 250-50 MCG/DOSE Aepb   Commonly known as: ADVAIR   Inhale 1 puff into the lungs every 12 (twelve) hours.      gabapentin 300 MG capsule   Commonly known as: NEURONTIN   Take 300 mg by mouth 2 (two) times daily.      guaiFENesin 600 MG 12 hr tablet   Commonly known as: MUCINEX   Take 2 tablets (1,200 mg total) by mouth 2 (two) times daily.      levofloxacin 750 MG tablet   Commonly known as: LEVAQUIN   Take 1 tablet (750 mg total) by mouth daily.      nicotine 14 mg/24hr patch   Commonly known as: NICODERM CQ - dosed in mg/24 hours   Place 1 patch onto the skin daily.      OVER  THE COUNTER MEDICATION   Take 1 capsule by mouth every morning. **Calcium 600 + Vitamin D 1200 Combination Tablet**      oxyCODONE-acetaminophen 5-325 MG per tablet   Commonly known as: PERCOCET   Take 0.5 tablets by mouth every 4 (four) hours as needed. Back spurs      PARoxetine 30 MG tablet   Commonly known as: PAXIL   Take 30 mg by mouth every morning.      polyethylene glycol powder powder   Commonly known as: GLYCOLAX/MIRALAX   Take 17 g by mouth daily.      predniSONE 10 MG tablet   Commonly known as: DELTASONE   Take 40mg  po daily for 2 days then 30mg  po daily for 2 days then 20mg  po daily for 2 days then 10mg  po daily for 2 days then stop      rosuvastatin 10 MG tablet   Commonly known as: CRESTOR   Take 1 tablet (10 mg total) by mouth daily at 6 PM. FOR YOUR HEART AND TO LOWER CHOLESTEROL.      tiotropium  18 MCG inhalation capsule   Commonly known as: SPIRIVA   Place 18 mcg into inhaler and inhale daily.      traZODone 100 MG tablet   Commonly known as: DESYREL   Take 0.5 tablets (50 mg total) by mouth at bedtime.      zolpidem 5 MG tablet   Commonly known as: AMBIEN   Take 2 tablets (10 mg total) by mouth at bedtime as needed for sleep.           Discharge Exam: Blood pressure 112/77, pulse 76, temperature 98.2 F (36.8 C), temperature source Oral, resp. rate 20, height 5\' 1"  (1.549 m), weight 66.8 kg (147 lb 4.3 oz), SpO2 93.00%. NAD, ambulating in halls and conversing on room air CTA B S1, S2, RRR Soft, NT, BS+ No edema b/l  Disposition and Follow-up:  Follow up with primary doctor in 2 weeks  Consults: none   Significant Diagnostic Studies:  Dg Chest Portable 1 View  07/11/2011  *RADIOLOGY REPORT*  Clinical Data: Shortness of breath.  PORTABLE CHEST - 1 VIEW  Comparison: 06/29/2011  Findings: Heart size and vascularity are normal and the lungs are clear of acute infiltrates and effusions.  No effusions.  No osseous abnormality.  IMPRESSION: No  acute abnormalities.  Original Report Authenticated By: Gwynn Burly, M.D.    Brief H and P: For complete details please refer to admission H and P, but in brief [name] was managed recently for acute respiratory failure requiring vent support. She states she felt better at d/c, but again developed sob when she went bac to her cold apartment. She denies fever or chest pain. Patient found to be in another acute exacerbation of COPD, hence referral for admission. She feels better after receiving bronchodilators/steroids/O2. However, she seems quite dyspneic at time of my evaluation. She continues to smoke.   Hospital Course:  Active Problems:  COPD (chronic obstructive pulmonary disease) with acute bronchitis  HTN (hypertension)  Tobacco abuse  Anxiety disorder  patient was admitted with shortness of breath secondary to COPD exacerbation. She was recently in the hospital and was discharged after a severe attack of COPD requiring mechanical ventilation. Patient was placed on supportive oxygen, intravenous steroids, antibiotics, nebulization treatments. With conservative management she has improved and appears to be back to her baseline.She is ambulating in the halls and is able to carry on a conversation without difficulty. Her room air his oxygen saturations are 94%. Patient will be discharged home today to followup with her primary care physician. Her steroids will be tapered. She was advised not to smoke anymore. She'll be discharged on nicotine patch. The remainder of her medical issues remain stable.  Time spent on Discharge: Signed: [name] [facility] Pager: [phone] 07/14/2011, 2:21 PM

## 2011-07-21 ENCOUNTER — Ambulatory Visit: Payer: PRIVATE HEALTH INSURANCE | Admitting: Adult Health

## 2011-07-28 ENCOUNTER — Other Ambulatory Visit: Payer: Self-pay

## 2011-07-28 ENCOUNTER — Encounter (HOSPITAL_COMMUNITY): Payer: Self-pay | Admitting: *Deleted

## 2011-07-28 ENCOUNTER — Emergency Department (HOSPITAL_COMMUNITY)
Admission: EM | Admit: 2011-07-28 | Discharge: 2011-07-28 | Disposition: A | Discharge status: Home / Self Care | Payer: PRIVATE HEALTH INSURANCE | Attending: Emergency Medicine | Admitting: Emergency Medicine

## 2011-07-28 ENCOUNTER — Emergency Department (HOSPITAL_COMMUNITY): Payer: PRIVATE HEALTH INSURANCE

## 2011-07-28 DIAGNOSIS — Z79899 Other long term (current) drug therapy: Secondary | ICD-10-CM | POA: Insufficient documentation

## 2011-07-28 DIAGNOSIS — E669 Obesity, unspecified: Secondary | ICD-10-CM | POA: Insufficient documentation

## 2011-07-28 DIAGNOSIS — Z7982 Long term (current) use of aspirin: Secondary | ICD-10-CM | POA: Insufficient documentation

## 2011-07-28 DIAGNOSIS — R42 Dizziness and giddiness: Secondary | ICD-10-CM

## 2011-07-28 DIAGNOSIS — J4489 Other specified chronic obstructive pulmonary disease: Secondary | ICD-10-CM | POA: Insufficient documentation

## 2011-07-28 DIAGNOSIS — I959 Hypotension, unspecified: Secondary | ICD-10-CM | POA: Insufficient documentation

## 2011-07-28 DIAGNOSIS — J449 Chronic obstructive pulmonary disease, unspecified: Secondary | ICD-10-CM | POA: Insufficient documentation

## 2011-07-28 DIAGNOSIS — I252 Old myocardial infarction: Secondary | ICD-10-CM | POA: Insufficient documentation

## 2011-07-28 DIAGNOSIS — R110 Nausea: Secondary | ICD-10-CM | POA: Insufficient documentation

## 2011-07-28 LAB — URINALYSIS, ROUTINE W REFLEX MICROSCOPIC
Bilirubin Urine: NEGATIVE
Glucose, UA: NEGATIVE mg/dL
Ketones, ur: NEGATIVE mg/dL
Leukocytes, UA: NEGATIVE
Nitrite: NEGATIVE
Protein, ur: NEGATIVE mg/dL
Specific Gravity, Urine: 1.01 (ref 1.005–1.030)
Urobilinogen, UA: 0.2 mg/dL (ref 0.0–1.0)
pH: 6.5 (ref 5.0–8.0)

## 2011-07-28 LAB — COMPREHENSIVE METABOLIC PANEL
ALT: 26 U/L (ref 0–35)
AST: 27 U/L (ref 0–37)
Albumin: 2.8 g/dL — ABNORMAL LOW (ref 3.5–5.2)
Alkaline Phosphatase: 75 U/L (ref 39–117)
BUN: 8 mg/dL (ref 6–23)
CO2: 30 mEq/L (ref 19–32)
Calcium: 8.7 mg/dL (ref 8.4–10.5)
Chloride: 98 mEq/L (ref 96–112)
Creatinine, Ser: 0.79 mg/dL (ref 0.50–1.10)
GFR calc Af Amer: >90 mL/min (ref >90)
GFR calc non Af Amer: 89 mL/min — ABNORMAL LOW (ref >90)
Glucose, Bld: 107 mg/dL — ABNORMAL HIGH (ref 70–99)
Potassium: 3.7 mEq/L (ref 3.5–5.1)
Sodium: 135 mEq/L (ref 135–145)
Total Bilirubin: 0.3 mg/dL (ref 0.3–1.2)
Total Protein: 6.1 g/dL (ref 6.0–8.3)

## 2011-07-28 LAB — CBC
HCT: 39.8 % (ref 36.0–46.0)
Hemoglobin: 13.1 g/dL (ref 12.0–15.0)
MCH: 29 pg (ref 26.0–34.0)
MCHC: 32.9 g/dL (ref 30.0–36.0)
MCV: 88.1 fL (ref 78.0–100.0)
Platelets: 218 10*3/uL (ref 150–400)
RBC: 4.52 MIL/uL (ref 3.87–5.11)
RDW: 15.1 % (ref 11.5–15.5)
WBC: 9.5 10*3/uL (ref 4.0–10.5)

## 2011-07-28 LAB — URINE MICROSCOPIC-ADD ON

## 2011-07-28 LAB — DIFFERENTIAL
Basophils Absolute: 0 10*3/uL (ref 0.0–0.1)
Basophils Relative: 0 % (ref 0–1)
Eosinophils Absolute: 0.1 10*3/uL (ref 0.0–0.7)
Eosinophils Relative: 2 % (ref 0–5)
Lymphocytes Relative: 29 % (ref 12–46)
Lymphs Abs: 2.7 10*3/uL (ref 0.7–4.0)
Monocytes Absolute: 0.4 10*3/uL (ref 0.1–1.0)
Monocytes Relative: 4 % (ref 3–12)
Neutro Abs: 6.2 10*3/uL (ref 1.7–7.7)
Neutrophils Relative %: 65 % (ref 43–77)

## 2011-07-28 LAB — TROPONIN I: Troponin I: <0.3 ng/mL (ref <0.30)

## 2011-07-28 MED ORDER — SODIUM CHLORIDE 0.9 % IV SOLN: Freq: Once | INTRAVENOUS | Status: DC

## 2011-07-28 MED ORDER — SODIUM CHLORIDE 0.9 % IV BOLUS (SEPSIS)
1000.0000 mL | Freq: Once | INTRAVENOUS | Status: AC
  Administered 2011-07-28: 1000 mL via INTRAVENOUS

## 2011-07-28 MED ORDER — SODIUM CHLORIDE 0.9 % IV SOLN
Freq: Once | INTRAVENOUS | Status: AC
  Administered 2011-07-28: 12:00:00 via INTRAVENOUS

## 2011-07-28 NOTE — ED Notes (Signed)
Pt c/o dizziness, nausea, vomiting and diarrhea since awakening at 0400. Pt's case manager went to check on her and found her to be orthostatic. Pt also c/o chronic leg and back pain and took 1/2 of her oxycodone this am.

## 2011-07-28 NOTE — ED Notes (Signed)
Patient denied any dizziness when sitting or standing.  Patient was able to stand on her own without assistance.

## 2011-07-28 NOTE — ED Provider Notes (Signed)
History    This chart was scribed for [employer]. Colon Branch, MD, MD by Smitty Pluck. The patient was seen in room APA04 and the patient's care was started at 11:22AM.   CSN: [id number]  Arrival date & time 07/28/11  1049   First MD Initiated Contact with Patient 07/28/11 1102      Chief Complaint  Patient presents with  . Dizziness    (Consider location/radiation/quality/duration/timing/severity/associated sxs/prior treatment) The history is provided by the patient.   Kristin Wells is a 61 y.o. female who presents to the Emergency Department complaining of moderate dizziness onset 2 days ago. The symptoms have been constant since onset without radiation. Pt reports that when she moves she sees "black spots." Pt denies taking diuretics. Pt has nausea. She denies vomiting and diarrhea. She has home health coming 2x per week. Pt reports that her BP was low since she was discharged from hospital. Pt was in hospital for recently for cardiac arrest and COPD. Pt has hx of back spurs. She has walker for assistance with walking.   PCP Dr. Ramond Craver  Past Medical History  Diagnosis Date  . COPD (chronic obstructive pulmonary disease)   . Asthma   . Bronchitis   . Anxiety   . Back pain   . Demand ischemia of myocardium 06/28/2011  . Thyroid enlargement 06/28/2011  . MI, acute, non ST segment elevation 06/29/2011    Myoview stress test revealed mild perfusion defect  . Ischemic cardiomyopathy 06/28/2011    EF 25%. Myoview stress test later showed ejection fraction of 65%  . PNA (pneumonia) 06/29/2011  . Acute respiratory failure 06/2011    Vent dependent sec to COPD/PNA    Past Surgical History  Procedure Date  . Abdominal hysterectomy     Family History  Problem Relation Age of Onset  . Diabetes Mother   . Diabetes Father   . Hypertension Mother   . Hypertension Father     History  Substance Use Topics  . Smoking status: Former Smoker    Quit date: 11/14/2010  . Smokeless tobacco:  Not on file  . Alcohol Use: No    OB History    Grav Para Term Preterm Abortions TAB SAB Ect Mult Living                  Review of Systems  All other systems reviewed and are negative.   10 Systems reviewed and are negative for acute change except as noted in the HPI.  Allergies  Review of patient's allergies indicates no known allergies.  Home Medications   Current Outpatient Rx  Name Route Sig Dispense Refill  . ALBUTEROL SULFATE HFA 108 (90 BASE) MCG/ACT IN AERS Inhalation Inhale 1-2 puffs into the lungs every 6 (six) hours as needed for wheezing. 1 Inhaler 0  . ALBUTEROL SULFATE (2.5 MG/3ML) 0.083% IN NEBU Nebulization Take 3 mLs (2.5 mg total) by nebulization every 6 (six) hours as needed. 75 mL 1  . ALPRAZOLAM 0.5 MG PO TABS Oral Take 1 tablet (0.5 mg total) by mouth 4 (four) times daily as needed. 60 tablet 0  . ASPIRIN EC 81 MG PO TBEC Oral Take 1 tablet (81 mg total) by mouth daily. 30 tablet 1  . CYCLOBENZAPRINE HCL 10 MG PO TABS Oral Take 0.5 tablets (5 mg total) by mouth 2 (two) times daily. 60 tablet 0  . DEXLANSOPRAZOLE 60 MG PO CPDR Oral Take 1 capsule (60 mg total) by mouth daily. 30 capsule 1  .  FLUTICASONE-SALMETEROL 250-50 MCG/DOSE IN AEPB Inhalation Inhale 1 puff into the lungs every 12 (twelve) hours. 60 each 1  . GABAPENTIN 300 MG PO CAPS Oral Take 1 capsule (300 mg total) by mouth 2 (two) times daily. 60 capsule 1  . GUAIFENESIN ER 600 MG PO TB12 Oral Take 2 tablets (1,200 mg total) by mouth 2 (two) times daily. 14 tablet 0  . NICOTINE 14 MG/24HR TD PT24 Transdermal Place 1 patch onto the skin daily. 28 patch 0  . OVER THE COUNTER MEDICATION Oral Take 1 capsule by mouth every morning. **Calcium 600 + Vitamin D 1200 Combination Tablet**    . OXYCODONE-ACETAMINOPHEN 5-325 MG PO TABS Oral Take 0.5 tablets by mouth every 4 (four) hours as needed. Back spurs 30 tablet 0  . PAROXETINE HCL 30 MG PO TABS Oral Take 1 tablet (30 mg total) by mouth every morning. 30  tablet 1  . POLYETHYLENE GLYCOL 3350 PO POWD Oral Take 17 g by mouth daily. 255 g 1  . PREDNISONE 10 MG PO TABS  Take 40mg  po daily for 2 days then 30mg  po daily for 2 days then 20mg  po daily for 2 days then 10mg  po daily for 2 days then stop 20 tablet 0  . ROFLUMILAST 500 MCG PO TABS Oral Take 1 tablet (500 mcg total) by mouth daily. 30 tablet 1  . ROSUVASTATIN CALCIUM 10 MG PO TABS Oral Take 1 tablet (10 mg total) by mouth daily at 6 PM. FOR YOUR HEART AND TO LOWER CHOLESTEROL. 30 tablet 3  . TIOTROPIUM BROMIDE MONOHYDRATE 18 MCG IN CAPS Inhalation Place 1 capsule (18 mcg total) into inhaler and inhale daily. 30 capsule 1  . TRAZODONE HCL 100 MG PO TABS Oral Take 0.5 tablets (50 mg total) by mouth at bedtime. 30 tablet 1  . ZOLPIDEM TARTRATE 5 MG PO TABS Oral Take 2 tablets (10 mg total) by mouth at bedtime as needed for sleep. 30 tablet 0    BP 96/74  Pulse 102  Temp(Src) 97.8 F (36.6 C) (Oral)  Resp 20  Ht 5\' 1"  (1.549 m)  Wt 136 lb (61.689 kg)  BMI 25.70 kg/m2  SpO2 98%  Physical Exam  Nursing note and vitals reviewed. Constitutional: She is oriented to person, place, and time. She appears well-developed and well-nourished. No distress.       When pt was sat up to listen to lungs, she reported seeing black spots and being dizzy.  HENT:  Head: Normocephalic and atraumatic.  Eyes: Conjunctivae are normal. Pupils are equal, round, and reactive to light.  Neck: Normal range of motion. Neck supple.  Cardiovascular: Normal rate, regular rhythm and normal heart sounds.   Pulmonary/Chest:       Crackles at bases bilaterally  Abdominal: Soft. She exhibits no distension.       obese  Neurological: She is alert and oriented to person, place, and time.  Skin: Skin is warm and dry.  Psychiatric: She has a normal mood and affect. Her behavior is normal.    ED Course  Procedures (including critical care time) DIAGNOSTIC STUDIES: Oxygen Saturation is 98% on room air, normal by my  interpretation.    COORDINATION OF CARE: 11:31AM EDP orders medication: 0.9% NaCl infusion  Results for orders placed during the hospital encounter of 07/28/11  CBC      Component Value Range   WBC 9.5  4.0 - 10.5 (K/uL)   RBC 4.52  3.87 - 5.11 (MIL/uL)   Hemoglobin 13.1  12.0 - 15.0 (g/dL)   HCT 78.2  95.6 - 21.3 (%)   MCV 88.1  78.0 - 100.0 (fL)   MCH 29.0  26.0 - 34.0 (pg)   MCHC 32.9  30.0 - 36.0 (g/dL)   RDW 08.6  57.8 - 46.9 (%)   Platelets 218  150 - 400 (K/uL)  DIFFERENTIAL      Component Value Range   Neutrophils Relative 65  43 - 77 (%)   Neutro Abs 6.2  1.7 - 7.7 (K/uL)   Lymphocytes Relative 29  12 - 46 (%)   Lymphs Abs 2.7  0.7 - 4.0 (K/uL)   Monocytes Relative 4  3 - 12 (%)   Monocytes Absolute 0.4  0.1 - 1.0 (K/uL)   Eosinophils Relative 2  0 - 5 (%)   Eosinophils Absolute 0.1  0.0 - 0.7 (K/uL)   Basophils Relative 0  0 - 1 (%)   Basophils Absolute 0.0  0.0 - 0.1 (K/uL)  COMPREHENSIVE METABOLIC PANEL      Component Value Range   Sodium 135  135 - 145 (mEq/L)   Potassium 3.7  3.5 - 5.1 (mEq/L)   Chloride 98  96 - 112 (mEq/L)   CO2 30  19 - 32 (mEq/L)   Glucose, Bld 107 (*) 70 - 99 (mg/dL)   BUN 8  6 - 23 (mg/dL)   Creatinine, Ser 6.29  0.50 - 1.10 (mg/dL)   Calcium 8.7  8.4 - 52.8 (mg/dL)   Total Protein 6.1  6.0 - 8.3 (g/dL)   Albumin 2.8 (*) 3.5 - 5.2 (g/dL)   AST 27  0 - 37 (U/L)   ALT 26  0 - 35 (U/L)   Alkaline Phosphatase 75  39 - 117 (U/L)   Total Bilirubin 0.3  0.3 - 1.2 (mg/dL)   GFR calc non Af Amer 89 (*) >90 (mL/min)   GFR calc Af Amer >90  >90 (mL/min)  TROPONIN I      Component Value Range   Troponin I <0.30  <0.30 (ng/mL)  URINALYSIS, ROUTINE W REFLEX MICROSCOPIC      Component Value Range   Color, Urine YELLOW  YELLOW    APPearance CLEAR  CLEAR    Specific Gravity, Urine 1.010  1.005 - 1.030    pH 6.5  5.0 - 8.0    Glucose, UA NEGATIVE  NEGATIVE (mg/dL)   Hgb urine dipstick TRACE (*) NEGATIVE    Bilirubin Urine NEGATIVE  NEGATIVE     Ketones, ur NEGATIVE  NEGATIVE (mg/dL)   Protein, ur NEGATIVE  NEGATIVE (mg/dL)   Urobilinogen, UA 0.2  0.0 - 1.0 (mg/dL)   Nitrite NEGATIVE  NEGATIVE    Leukocytes, UA NEGATIVE  NEGATIVE   CULTURE, BLOOD (ROUTINE X 2)      Component Value Range   Specimen Description Blood RIGHT ARM     Special Requests NONE BOTTLES DRAWN AEROBIC AND ANAEROBIC 8 CC EACH     Culture PENDING     Report Status PENDING    CULTURE, BLOOD (ROUTINE X 2)      Component Value Range   Specimen Description Blood LEFT HAND     Special Requests NONE BOTTLES DRAWN AEROBIC AND ANAEROBIC 8 CC EACH     Culture PENDING     Report Status PENDING    URINE MICROSCOPIC-ADD ON      Component Value Range   Squamous Epithelial / LPF MANY (*) RARE    WBC, UA 3-6  <3 (WBC/hpf)  RBC / HPF 3-6  <3 (RBC/hpf)   Bacteria, UA FEW (*) RARE     Dg Chest Port 1 View  07/28/2011  *RADIOLOGY REPORT*  Clinical Data: Dizziness and nausea.  Recent cardiac arrest.  PORTABLE CHEST - 1 VIEW  Comparison: 07/11/2011.  Findings: Trachea is midline.  Heart size normal.  Lungs are clear. No pleural fluid.  IMPRESSION: No acute findings.  Original Report Authenticated By: Reyes Ivan, M.D.       Date: 07/28/2011  1100  Rate: 103  Rhythm: sinus tachycardia  QRS Axis: normal  Intervals: normal  ST/T Wave abnormalities: nonspecific T wave changes  Conduction Disutrbances:none  Narrative Interpretation:   Old EKG Reviewed: changes noted c/w 07/11/11 ST depression and T wave inversion no longer present  Review of previous admissions 2/18-2/26/2013 and 3/4-07/14/2011. Admitted is respiratory distress requiring ventilator support. Developed cardiac ischemia with probable NSTEMI. EF 65%. Possible sepsis.  Antihypertensives discontinued for hypotension. Readmission for exacerbation of COPD. D/C with home health and home PT. Blood pressure at time of discharge was 95/63 - 112/67.   MDM  Patient with dizziness and hypotension x 2 days. Given  IVF with improvement in dizziness symptoms. Continued low blood pressure. Given additional IVF. Able to ambulate in the department without dizziness. Blood pressure remains in the 90s and low 100s. Pt stable in ED with no significant deterioration in condition.The patient appears reasonably screened and/or stabilized for discharge and I doubt any other medical condition or other [facility] requiring further screening, evaluation, or treatment in the ED at this time prior to discharge.     I personally performed the services described in this documentation, which was scribed in my presence. The recorded information has been reviewed and considered.    MDM Reviewed: nursing note and vitals Interpretation: labs, ECG and x-ray          Nicoletta Dress. Colon Branch, MD 07/28/11 1557

## 2011-07-28 NOTE — Discharge Instructions (Signed)
Drink lots of fluids. Continue to take your home medicines.

## 2011-07-30 ENCOUNTER — Emergency Department (HOSPITAL_COMMUNITY): Payer: PRIVATE HEALTH INSURANCE

## 2011-07-30 ENCOUNTER — Encounter (HOSPITAL_COMMUNITY): Payer: Self-pay

## 2011-07-30 ENCOUNTER — Other Ambulatory Visit: Payer: Self-pay

## 2011-07-30 ENCOUNTER — Inpatient Hospital Stay (HOSPITAL_COMMUNITY)
Admission: EM | Admit: 2011-07-30 | Discharge: 2011-08-02 | DRG: 208 | Disposition: A | Discharge status: Home / Self Care | Payer: PRIVATE HEALTH INSURANCE | Attending: Internal Medicine | Admitting: Internal Medicine

## 2011-07-30 DIAGNOSIS — Z720 Tobacco use: Secondary | ICD-10-CM

## 2011-07-30 DIAGNOSIS — R739 Hyperglycemia, unspecified: Secondary | ICD-10-CM | POA: Diagnosis present

## 2011-07-30 DIAGNOSIS — J9600 Acute respiratory failure, unspecified whether with hypoxia or hypercapnia: Secondary | ICD-10-CM | POA: Diagnosis present

## 2011-07-30 DIAGNOSIS — J4489 Other specified chronic obstructive pulmonary disease: Secondary | ICD-10-CM

## 2011-07-30 DIAGNOSIS — F411 Generalized anxiety disorder: Secondary | ICD-10-CM

## 2011-07-30 DIAGNOSIS — J440 Chronic obstructive pulmonary disease with acute lower respiratory infection: Secondary | ICD-10-CM | POA: Diagnosis present

## 2011-07-30 DIAGNOSIS — R7309 Other abnormal glucose: Secondary | ICD-10-CM | POA: Diagnosis present

## 2011-07-30 DIAGNOSIS — R5381 Other malaise: Secondary | ICD-10-CM | POA: Diagnosis present

## 2011-07-30 DIAGNOSIS — I214 Non-ST elevation (NSTEMI) myocardial infarction: Secondary | ICD-10-CM

## 2011-07-30 DIAGNOSIS — F329 Major depressive disorder, single episode, unspecified: Secondary | ICD-10-CM | POA: Diagnosis present

## 2011-07-30 DIAGNOSIS — I509 Heart failure, unspecified: Secondary | ICD-10-CM

## 2011-07-30 DIAGNOSIS — E876 Hypokalemia: Secondary | ICD-10-CM

## 2011-07-30 DIAGNOSIS — I10 Essential (primary) hypertension: Secondary | ICD-10-CM | POA: Diagnosis present

## 2011-07-30 DIAGNOSIS — E049 Nontoxic goiter, unspecified: Secondary | ICD-10-CM

## 2011-07-30 DIAGNOSIS — I255 Ischemic cardiomyopathy: Secondary | ICD-10-CM

## 2011-07-30 DIAGNOSIS — F17200 Nicotine dependence, unspecified, uncomplicated: Secondary | ICD-10-CM | POA: Diagnosis present

## 2011-07-30 DIAGNOSIS — J9620 Acute and chronic respiratory failure, unspecified whether with hypoxia or hypercapnia: Principal | ICD-10-CM | POA: Diagnosis present

## 2011-07-30 DIAGNOSIS — F419 Anxiety disorder, unspecified: Secondary | ICD-10-CM | POA: Diagnosis present

## 2011-07-30 DIAGNOSIS — F32A Depression, unspecified: Secondary | ICD-10-CM | POA: Diagnosis present

## 2011-07-30 DIAGNOSIS — J449 Chronic obstructive pulmonary disease, unspecified: Secondary | ICD-10-CM

## 2011-07-30 DIAGNOSIS — F3289 Other specified depressive episodes: Secondary | ICD-10-CM

## 2011-07-30 DIAGNOSIS — R0689 Other abnormalities of breathing: Secondary | ICD-10-CM | POA: Diagnosis present

## 2011-07-30 DIAGNOSIS — J209 Acute bronchitis, unspecified: Secondary | ICD-10-CM | POA: Diagnosis present

## 2011-07-30 DIAGNOSIS — G8929 Other chronic pain: Secondary | ICD-10-CM | POA: Diagnosis present

## 2011-07-30 DIAGNOSIS — R5383 Other fatigue: Secondary | ICD-10-CM | POA: Diagnosis present

## 2011-07-30 HISTORY — DX: Atherosclerotic heart disease of native coronary artery without angina pectoris: I25.10

## 2011-07-30 HISTORY — DX: Pneumonia, unspecified organism: J18.9

## 2011-07-30 HISTORY — DX: Essential (primary) hypertension: I10

## 2011-07-30 HISTORY — DX: Acute myocardial infarction, unspecified: I21.9

## 2011-07-30 LAB — GLUCOSE, CAPILLARY
Glucose-Capillary: 188 mg/dL — ABNORMAL HIGH (ref 70–99)
Glucose-Capillary: 173 mg/dL — ABNORMAL HIGH (ref 70–99)
Glucose-Capillary: 151 mg/dL — ABNORMAL HIGH (ref 70–99)
Glucose-Capillary: 169 mg/dL — ABNORMAL HIGH (ref 70–99)

## 2011-07-30 LAB — URINALYSIS, ROUTINE W REFLEX MICROSCOPIC
Bilirubin Urine: NEGATIVE
Glucose, UA: 100 mg/dL — AB
Ketones, ur: NEGATIVE mg/dL
Leukocytes, UA: NEGATIVE
Nitrite: NEGATIVE
Protein, ur: 30 mg/dL — AB
Specific Gravity, Urine: 1.025 (ref 1.005–1.030)
Urobilinogen, UA: 0.2 mg/dL (ref 0.0–1.0)
pH: 6 (ref 5.0–8.0)

## 2011-07-30 LAB — CBC
HCT: 40.8 % (ref 36.0–46.0)
Hemoglobin: 13.1 g/dL (ref 12.0–15.0)
MCH: 29 pg (ref 26.0–34.0)
MCHC: 32.1 g/dL (ref 30.0–36.0)
MCV: 90.3 fL (ref 78.0–100.0)
Platelets: 208 10*3/uL (ref 150–400)
RBC: 4.52 MIL/uL (ref 3.87–5.11)
RDW: 15.2 % (ref 11.5–15.5)
WBC: 9 10*3/uL (ref 4.0–10.5)

## 2011-07-30 LAB — CARDIAC PANEL(CRET KIN+CKTOT+MB+TROPI)
CK, MB: 1.5 ng/mL (ref 0.3–4.0)
Relative Index: INVALID (ref 0.0–2.5)
Total CK: 30 U/L (ref 7–177)
Troponin I: <0.3 ng/mL (ref <0.30)

## 2011-07-30 LAB — BLOOD GAS, ARTERIAL
Acid-Base Excess: 2.5 mmol/L — ABNORMAL HIGH (ref 0.0–2.0)
Acid-Base Excess: 1.2 mmol/L (ref 0.0–2.0)
Acid-Base Excess: 6.6 mmol/L — ABNORMAL HIGH (ref 0.0–2.0)
Bicarbonate: 29.2 mEq/L — ABNORMAL HIGH (ref 20.0–24.0)
Bicarbonate: 26.8 mEq/L — ABNORMAL HIGH (ref 20.0–24.0)
Bicarbonate: 32 mEq/L — ABNORMAL HIGH (ref 20.0–24.0)
Drawn by: 234301
FIO2: 0.6 %
FIO2: 1 %
MECHVT: 500 mL
O2 Content: 3 L/min
O2 Saturation: 98.8 %
O2 Saturation: 99.7 %
O2 Saturation: 92.9 %
PEEP: 5 cmH2O
Patient temperature: 37
Patient temperature: 37
Patient temperature: 37
RATE: 14 resp/min
TCO2: 29.6 mmol/L (ref 0–100)
pCO2 arterial: 70 mmHg (ref 35.0–45.0)
pCO2 arterial: 54.9 mmHg — ABNORMAL HIGH (ref 35.0–45.0)
pCO2 arterial: 59.8 mmHg (ref 35.0–45.0)
pH, Arterial: 7.244 — ABNORMAL LOW (ref 7.350–7.400)
pH, Arterial: 7.309 — ABNORMAL LOW (ref 7.350–7.400)
pH, Arterial: 7.348 — ABNORMAL LOW (ref 7.350–7.400)
pO2, Arterial: 161 mmHg — ABNORMAL HIGH (ref 80.0–100.0)
pO2, Arterial: 400 mmHg — ABNORMAL HIGH (ref 80.0–100.0)
pO2, Arterial: 66.3 mmHg — ABNORMAL LOW (ref 80.0–100.0)

## 2011-07-30 LAB — RAPID URINE DRUG SCREEN, HOSP PERFORMED
Amphetamines: NOT DETECTED
Barbiturates: NOT DETECTED
Benzodiazepines: POSITIVE — AB
Cocaine: NOT DETECTED
Opiates: NOT DETECTED
Tetrahydrocannabinol: NOT DETECTED

## 2011-07-30 LAB — BASIC METABOLIC PANEL
BUN: 5 mg/dL — ABNORMAL LOW (ref 6–23)
CO2: 31 mEq/L (ref 19–32)
Calcium: 9.2 mg/dL (ref 8.4–10.5)
Chloride: 103 mEq/L (ref 96–112)
Creatinine, Ser: 0.94 mg/dL (ref 0.50–1.10)
GFR calc Af Amer: 75 mL/min — ABNORMAL LOW (ref >90)
GFR calc non Af Amer: 65 mL/min — ABNORMAL LOW (ref >90)
Glucose, Bld: 158 mg/dL — ABNORMAL HIGH (ref 70–99)
Potassium: 4.2 mEq/L (ref 3.5–5.1)
Sodium: 142 mEq/L (ref 135–145)

## 2011-07-30 LAB — PRO B NATRIURETIC PEPTIDE: Pro B Natriuretic peptide (BNP): 259.3 pg/mL — ABNORMAL HIGH (ref 0–125)

## 2011-07-30 LAB — URINE MICROSCOPIC-ADD ON

## 2011-07-30 LAB — DIFFERENTIAL
Basophils Absolute: 0 10*3/uL (ref 0.0–0.1)
Basophils Relative: 0 % (ref 0–1)
Eosinophils Absolute: 0.2 10*3/uL (ref 0.0–0.7)
Eosinophils Relative: 3 % (ref 0–5)
Lymphocytes Relative: 37 % (ref 12–46)
Lymphs Abs: 3.3 10*3/uL (ref 0.7–4.0)
Monocytes Absolute: 0.7 10*3/uL (ref 0.1–1.0)
Monocytes Relative: 8 % (ref 3–12)
Neutro Abs: 4.8 10*3/uL (ref 1.7–7.7)
Neutrophils Relative %: 53 % (ref 43–77)

## 2011-07-30 LAB — MRSA PCR SCREENING: MRSA by PCR: NEGATIVE

## 2011-07-30 LAB — TROPONIN I: Troponin I: <0.3 ng/mL (ref <0.30)

## 2011-07-30 MED ORDER — FENTANYL CITRATE 0.05 MG/ML IJ SOLN
75.0000 ug | Freq: Once | INTRAMUSCULAR | Status: AC
  Administered 2011-07-30: 75 ug via INTRAVENOUS
  Filled 2011-07-30: qty 2

## 2011-07-30 MED ORDER — FENTANYL BOLUS VIA INFUSION
50.0000 ug | Freq: Four times a day (QID) | INTRAVENOUS | Status: DC | PRN
  Filled 2011-07-30: qty 100

## 2011-07-30 MED ORDER — IPRATROPIUM BROMIDE 0.02 % IN SOLN
0.5000 mg | RESPIRATORY_TRACT | Status: DC
  Administered 2011-07-30 – 2011-08-01 (×16): 0.5 mg via RESPIRATORY_TRACT
  Filled 2011-07-30 (×15): qty 2.5

## 2011-07-30 MED ORDER — ASPIRIN EC 81 MG PO TBEC
81.0000 mg | DELAYED_RELEASE_TABLET | Freq: Every day | ORAL | Status: DC
  Administered 2011-07-30 – 2011-08-02 (×4): 81 mg via ORAL
  Filled 2011-07-30 (×4): qty 1

## 2011-07-30 MED ORDER — MIDAZOLAM HCL 50 MG/10ML IJ SOLN
INTRAMUSCULAR | Status: AC
  Filled 2011-07-30: qty 1

## 2011-07-30 MED ORDER — PAROXETINE HCL 20 MG PO TABS
30.0000 mg | ORAL_TABLET | ORAL | Status: DC
  Administered 2011-07-30: 30 mg via ORAL
  Filled 2011-07-30: qty 2

## 2011-07-30 MED ORDER — SODIUM CHLORIDE 0.9 % IV SOLN
2.0000 mg/h | INTRAVENOUS | Status: DC
  Filled 2011-07-30: qty 10

## 2011-07-30 MED ORDER — SODIUM CHLORIDE 0.9 % IV SOLN
2.0000 mg/h | INTRAVENOUS | Status: DC
  Administered 2011-07-31: 2 mg/h via INTRAVENOUS
  Filled 2011-07-30: qty 10

## 2011-07-30 MED ORDER — ENOXAPARIN SODIUM 40 MG/0.4ML ~~LOC~~ SOLN
40.0000 mg | SUBCUTANEOUS | Status: DC
  Administered 2011-07-30 – 2011-08-02 (×4): 40 mg via SUBCUTANEOUS
  Filled 2011-07-30 (×4): qty 0.4

## 2011-07-30 MED ORDER — ETOMIDATE 2 MG/ML IV SOLN
20.0000 mg | Freq: Once | INTRAVENOUS | Status: AC
  Administered 2011-07-30: 20 mg via INTRAVENOUS
  Filled 2011-07-30: qty 10

## 2011-07-30 MED ORDER — SODIUM CHLORIDE 0.9 % IV SOLN
80.0000 mg | Freq: Two times a day (BID) | INTRAVENOUS | Status: DC
  Administered 2011-07-30 (×2): 80 mg via INTRAVENOUS
  Filled 2011-07-30 (×5): qty 80

## 2011-07-30 MED ORDER — MOXIFLOXACIN HCL IN NACL 400 MG/250ML IV SOLN
400.0000 mg | INTRAVENOUS | Status: DC
  Administered 2011-07-30 – 2011-08-01 (×3): 400 mg via INTRAVENOUS
  Filled 2011-07-30 (×3): qty 250

## 2011-07-30 MED ORDER — MIDAZOLAM HCL 2 MG/2ML IJ SOLN
2.0000 mg | Freq: Once | INTRAMUSCULAR | Status: AC
  Administered 2011-07-30: 2 mg via INTRAVENOUS
  Filled 2011-07-30: qty 2

## 2011-07-30 MED ORDER — INSULIN ASPART 100 UNIT/ML ~~LOC~~ SOLN
0.0000 [IU] | SUBCUTANEOUS | Status: DC
  Administered 2011-07-30 – 2011-07-31 (×6): 2 [IU] via SUBCUTANEOUS
  Administered 2011-07-31 (×2): 1 [IU] via SUBCUTANEOUS
  Administered 2011-07-31 (×2): 2 [IU] via SUBCUTANEOUS
  Administered 2011-08-01: 3 [IU] via SUBCUTANEOUS
  Administered 2011-08-01: 1 [IU] via SUBCUTANEOUS
  Administered 2011-08-01: 2 [IU] via SUBCUTANEOUS
  Administered 2011-08-01: 3 [IU] via SUBCUTANEOUS
  Administered 2011-08-02: 2 [IU] via SUBCUTANEOUS

## 2011-07-30 MED ORDER — ASPIRIN 81 MG PO CHEW
324.0000 mg | CHEWABLE_TABLET | Freq: Once | ORAL | Status: AC
  Administered 2011-07-30: 324 mg via ORAL
  Filled 2011-07-30: qty 4

## 2011-07-30 MED ORDER — SUCCINYLCHOLINE CHLORIDE 20 MG/ML IJ SOLN
75.0000 mg | Freq: Once | INTRAMUSCULAR | Status: AC
  Administered 2011-07-30: 75 mg via INTRAVENOUS
  Filled 2011-07-30: qty 1

## 2011-07-30 MED ORDER — SODIUM CHLORIDE 0.9 % IV SOLN
50.0000 ug/h | INTRAVENOUS | Status: DC
  Administered 2011-07-30: 50 ug/h via INTRAVENOUS
  Filled 2011-07-30: qty 50

## 2011-07-30 MED ORDER — FENTANYL CITRATE 0.05 MG/ML IJ SOLN
INTRAMUSCULAR | Status: AC
  Filled 2011-07-30: qty 50

## 2011-07-30 MED ORDER — SODIUM CHLORIDE 0.9 % IV SOLN
INTRAVENOUS | Status: DC
  Administered 2011-07-30: 02:00:00 via INTRAVENOUS

## 2011-07-30 MED ORDER — HYDROMORPHONE HCL PF 1 MG/ML IJ SOLN
1.0000 mg | Freq: Once | INTRAMUSCULAR | Status: AC
  Administered 2011-07-30: 1 mg via INTRAVENOUS
  Filled 2011-07-30: qty 1

## 2011-07-30 MED ORDER — GABAPENTIN 300 MG PO CAPS: 300.0000 mg | ORAL_CAPSULE | Freq: Two times a day (BID) | ORAL | Status: DC

## 2011-07-30 MED ORDER — PANTOPRAZOLE SODIUM 40 MG PO TBEC: 80.0000 mg | DELAYED_RELEASE_TABLET | Freq: Two times a day (BID) | ORAL | Status: DC

## 2011-07-30 MED ORDER — NALOXONE HCL 1 MG/ML IJ SOLN
INTRAMUSCULAR | Status: AC
  Administered 2011-07-30: 03:00:00
  Filled 2011-07-30: qty 2

## 2011-07-30 MED ORDER — ALBUTEROL SULFATE (5 MG/ML) 0.5% IN NEBU
INHALATION_SOLUTION | RESPIRATORY_TRACT | Status: AC
  Administered 2011-07-30: 5 mg
  Filled 2011-07-30: qty 1

## 2011-07-30 MED ORDER — LORAZEPAM 2 MG/ML IJ SOLN
1.0000 mg | Freq: Once | INTRAMUSCULAR | Status: DC
  Filled 2011-07-30: qty 1

## 2011-07-30 MED ORDER — POLYETHYLENE GLYCOL 3350 17 G PO PACK
17.0000 g | PACK | Freq: Every day | ORAL | Status: DC
  Administered 2011-07-30 – 2011-07-31 (×2): 17 g via ORAL
  Filled 2011-07-30 (×2): qty 1

## 2011-07-30 MED ORDER — POTASSIUM CHLORIDE IN NACL 40-0.9 MEQ/L-% IV SOLN
INTRAVENOUS | Status: DC
  Administered 2011-07-30 – 2011-07-31 (×2): via INTRAVENOUS
  Filled 2011-07-30 (×7): qty 1000

## 2011-07-30 MED ORDER — BIOTENE DRY MOUTH MT LIQD
15.0000 mL | Freq: Four times a day (QID) | OROMUCOSAL | Status: DC
  Administered 2011-07-30 – 2011-07-31 (×6): 15 mL via OROMUCOSAL

## 2011-07-30 MED ORDER — ATORVASTATIN CALCIUM 20 MG PO TABS: 20.0000 mg | ORAL_TABLET | Freq: Every day | ORAL | Status: DC

## 2011-07-30 MED ORDER — IPRATROPIUM BROMIDE 0.02 % IN SOLN
0.5000 mg | Freq: Once | RESPIRATORY_TRACT | Status: AC
Start: 2011-07-30 — End: 2011-07-30
  Administered 2011-07-30: 0.5 mg via RESPIRATORY_TRACT
  Filled 2011-07-30: qty 2.5

## 2011-07-30 MED ORDER — ALBUTEROL (5 MG/ML) CONTINUOUS INHALATION SOLN
7.5000 mg/h | INHALATION_SOLUTION | Freq: Once | RESPIRATORY_TRACT | Status: AC
  Administered 2011-07-30: 7.5 mg/h via RESPIRATORY_TRACT
  Filled 2011-07-30: qty 20

## 2011-07-30 MED ORDER — METHYLPREDNISOLONE SODIUM SUCC 125 MG IJ SOLR
125.0000 mg | Freq: Four times a day (QID) | INTRAMUSCULAR | Status: DC
  Administered 2011-07-30 – 2011-07-31 (×5): 125 mg via INTRAVENOUS
  Filled 2011-07-30 (×6): qty 2

## 2011-07-30 MED ORDER — NICOTINE 14 MG/24HR TD PT24
14.0000 mg | MEDICATED_PATCH | Freq: Every day | TRANSDERMAL | Status: DC
  Administered 2011-07-30 – 2011-07-31 (×2): 14 mg via TRANSDERMAL
  Filled 2011-07-30 (×2): qty 1

## 2011-07-30 MED ORDER — FUROSEMIDE 10 MG/ML IJ SOLN
20.0000 mg | Freq: Once | INTRAMUSCULAR | Status: AC
  Administered 2011-07-30: 20 mg via INTRAVENOUS
  Filled 2011-07-30: qty 2

## 2011-07-30 MED ORDER — SODIUM CHLORIDE 0.9 % IJ SOLN
INTRAMUSCULAR | Status: AC
  Administered 2011-07-30: 15:00:00
  Filled 2011-07-30: qty 3

## 2011-07-30 MED ORDER — IPRATROPIUM BROMIDE 0.02 % IN SOLN
RESPIRATORY_TRACT | Status: AC
  Administered 2011-07-30: 0.5 mg
  Filled 2011-07-30: qty 2.5

## 2011-07-30 MED ORDER — MIDAZOLAM BOLUS VIA INFUSION
1.0000 mg | INTRAVENOUS | Status: DC | PRN
  Filled 2011-07-30: qty 2

## 2011-07-30 MED ORDER — SODIUM CHLORIDE 0.9 % IV SOLN: 50.0000 ug/h | INTRAVENOUS | Status: DC

## 2011-07-30 MED ORDER — POLYETHYLENE GLYCOL 3350 17 GM/SCOOP PO POWD: 17.0000 g | Freq: Every day | ORAL | Status: DC

## 2011-07-30 MED ORDER — CHLORHEXIDINE GLUCONATE 0.12 % MT SOLN
15.0000 mL | Freq: Two times a day (BID) | OROMUCOSAL | Status: DC
  Administered 2011-07-30 – 2011-07-31 (×3): 15 mL via OROMUCOSAL
  Filled 2011-07-30 (×3): qty 15

## 2011-07-30 MED ORDER — SODIUM CHLORIDE 0.9 % IV SOLN
1.0000 mg/h | INTRAVENOUS | Status: DC
  Filled 2011-07-30: qty 10

## 2011-07-30 MED ORDER — TRAZODONE HCL 50 MG PO TABS: 100.0000 mg | ORAL_TABLET | Freq: Every day | ORAL | Status: DC

## 2011-07-30 MED ORDER — SODIUM CHLORIDE 0.9 % IV SOLN
1.0000 mg/h | INTRAVENOUS | Status: DC
  Administered 2011-07-30: 1 mg/h via INTRAVENOUS
  Filled 2011-07-30: qty 10

## 2011-07-30 MED ORDER — ALBUTEROL SULFATE (5 MG/ML) 0.5% IN NEBU
2.5000 mg | INHALATION_SOLUTION | RESPIRATORY_TRACT | Status: DC
  Administered 2011-07-30 – 2011-08-01 (×16): 2.5 mg via RESPIRATORY_TRACT
  Filled 2011-07-30 (×15): qty 0.5

## 2011-07-30 NOTE — ED Notes (Signed)
OG Tube in place, placement verified by auscultation. Placed to low wall suction.

## 2011-07-30 NOTE — H&P (Signed)
PCP:   Kristin German, MD, MD   Chief Complaint:  Respiratory distress days  HPI: Kristin Wells is an 61 y.o. female.  Chronic pain on narcotics, recurrent admissions for respiratory failure related to a COPD exacerbation and tobacco abuse, discharged from this facility 2 weeks ago for similar, required intubation at that time; has been short of breath for the past few days not helped by home nebulizer and eventually came to the way she was found to be in severe respiratory distress. She had minimal response to nebulization but continued to complain of severe back pain and received also some hydromorphone. Continued to have severe respiratory became increasing lethargic despite Narcan and eventually required intubation both for respiratory distress and for airway protection because of her increasing lethargy associated with increased work of breathing. Hospitalist service was called to assist with management.  Because the patient is now intubated and sedated , no further history can be obtained other than what is already in the chart     Rewiew of Systems:  Unable to obtain further  Past Medical History  Diagnosis Date  . COPD (chronic obstructive pulmonary disease)   . Asthma   . Bronchitis   . Anxiety   . Back pain   . Demand ischemia of myocardium 06/28/2011  . Thyroid enlargement 06/28/2011  . MI, acute, non ST segment elevation 06/29/2011    Myoview stress test revealed mild perfusion defect  . Ischemic cardiomyopathy 06/28/2011    EF 25%. Myoview stress test later showed ejection fraction of 65%  . PNA (pneumonia) 06/29/2011  . Acute respiratory failure 06/2011    Vent dependent sec to COPD/PNA    Past Surgical History  Procedure Date  . Abdominal hysterectomy     Medications:  HOME MEDS: Prior to Admission medications   Medication Sig Start Date End Date Taking? Authorizing Provider  albuterol (PROVENTIL HFA;VENTOLIN HFA) 108 (90 BASE) MCG/ACT inhaler Inhale 1-2 puffs  into the lungs every 6 (six) hours as needed for wheezing. 07/14/11 07/13/12 Yes Erick Blinks, MD  albuterol (PROVENTIL) (2.5 MG/3ML) 0.083% nebulizer solution Take 3 mLs (2.5 mg total) by nebulization every 6 (six) hours as needed. 07/14/11  Yes Erick Blinks, MD  ALPRAZolam [name]) 0.5 MG tablet Take 0.5 mg by mouth 4 (four) times daily. 07/14/11  Yes Erick Blinks, MD  aspirin EC 81 MG tablet Take 1 tablet (81 mg total) by mouth daily. 07/14/11 07/13/12 Yes Erick Blinks, MD  cyclobenzaprine (FLEXERIL) 5 MG tablet Take 5 mg by mouth 3 (three) times daily as needed.   Yes Historical Provider, MD  dexlansoprazole (DEXILANT) 60 MG capsule Take 1 capsule (60 mg total) by mouth daily. 07/14/11  Yes Erick Blinks, MD  Fluticasone-Salmeterol (ADVAIR) 250-50 MCG/DOSE AEPB Inhale 1 puff into the lungs every 12 (twelve) hours. 07/14/11  Yes Erick Blinks, MD  gabapentin (NEURONTIN) 300 MG capsule Take 300 mg by mouth 2 (two) times daily. 07/14/11  Yes Erick Blinks, MD  guaiFENesin (MUCINEX) 600 MG 12 hr tablet Take 2 tablets (1,200 mg total) by mouth 2 (two) times daily. 07/14/11 07/13/12 Yes Erick Blinks, MD  OVER THE COUNTER MEDICATION Take 1 capsule by mouth every morning. **Calcium 600 + Vitamin D 1200 Combination Tablet**   Yes Historical Provider, MD  oxybutynin (DITROPAN-XL) 5 MG 24 hr tablet Take 5 mg by mouth daily.   Yes Historical Provider, MD  oxyCODONE-acetaminophen (PERCOCET) 5-325 MG per tablet Take 0.5 tablets by mouth every 4 (four) hours as needed. Back spurs  07/14/11  Yes Erick Blinks, MD  PARoxetine (PAXIL) 30 MG tablet Take 1 tablet (30 mg total) by mouth every morning. 07/14/11  Yes Erick Blinks, MD  polyethylene glycol powder (GLYCOLAX/MIRALAX) powder Take 17 g by mouth daily. 07/14/11  Yes Erick Blinks, MD  roflumilast (DALIRESP) 500 MCG TABS tablet Take 1 tablet (500 mcg total) by mouth daily. 07/14/11  Yes Erick Blinks, MD  rosuvastatin (CRESTOR) 10 MG tablet Take 10 mg by mouth daily.   Yes  Historical Provider, MD  tiotropium (SPIRIVA) 18 MCG inhalation capsule Place 1 capsule (18 mcg total) into inhaler and inhale daily. 07/14/11  Yes Erick Blinks, MD  traZODone (DESYREL) 100 MG tablet Take 100 mg by mouth at bedtime. 07/14/11  Yes Erick Blinks, MD  nicotine (NICODERM CQ - DOSED IN MG/24 HOURS) 14 mg/24hr patch Place 1 patch onto the skin daily. 07/14/11 08/13/11  Erick Blinks, MD  zolpidem (AMBIEN) 10 MG tablet Take 10 mg by mouth at bedtime.    Historical Provider, MD     Allergies:  No Known Allergies  Social History:   reports that she quit smoking about 8 months ago. She does not have any smokeless tobacco history on file. She reports that she does not drink alcohol or use illicit drugs.  Family History: Family History  Problem Relation Age of Onset  . Diabetes Mother   . Diabetes Father   . Hypertension Mother   . Hypertension Father      Physical Exam: Filed Vitals:   07/30/11 0514 07/30/11 0529 07/30/11 0552 07/30/11 0600  BP: 85/55 82/55 82/54  85/56  Pulse: 102 98 95 96  Temp:      TempSrc:      Resp: 14 14 14 14   SpO2: 97% 98% 96% 96%   Blood pressure 85/56, pulse 96, temperature 98.2 F (36.8 C), temperature source Oral, resp. rate 14, SpO2 96.00%.  GEN:  Sedated and paralyzed and therefore unresponsive middle-aged Caucasian lady ying in the stretcher; ventilated. PSYCH:  Unresponsive due to medical intervention.Marland Kitchen HEENT: Mucous membranes pink and anicteric; PERRL;  no cervical lymphadenopathy nor thyromegaly or carotid bruit;  Breasts:: Not examined CHEST WALL: No tenderness CHEST: clear to auscultation bilaterally; and Atrovent HEART: Regular rate and rhythm; no murmurs rubs or gallops BACK: No kyphosis or scoliosis;  ABDOMEN: Scaphoid, soft, no masses, no organomegaly, normal abdominal bowel sounds; no pannus; no intertriginous candida. Rectal Exam: Not done EXTREMITIES:; age-appropriate arthropathy of the hands and knees; no edema; no  ulcerations. Genitalia: not examined PULSES: 2+ and symmetric SKIN: Normal hydration no rash or ulceration CNS: Cranial nerves 2-12 grossly intact no focal lateralizing neurologic deficit   Labs & Imaging Results for orders placed during the hospital encounter of 07/30/11 (from the past 48 hour(s))  CBC     Status: Normal   Collection Time   07/30/11  1:56 AM      Component Value Range Comment   WBC 9.0  4.0 - 10.5 (K/uL)    RBC 4.52  3.87 - 5.11 (MIL/uL)    Hemoglobin 13.1  12.0 - 15.0 (g/dL)    HCT 16.1  09.6 - 04.5 (%)    MCV 90.3  78.0 - 100.0 (fL)    MCH 29.0  26.0 - 34.0 (pg)    MCHC 32.1  30.0 - 36.0 (g/dL)    RDW 40.9  81.1 - 91.4 (%)    Platelets 208  150 - 400 (K/uL)   DIFFERENTIAL     Status: Normal  Collection Time   07/30/11  1:56 AM      Component Value Range Comment   Neutrophils Relative 53  43 - 77 (%)    Neutro Abs 4.8  1.7 - 7.7 (K/uL)    Lymphocytes Relative 37  12 - 46 (%)    Lymphs Abs 3.3  0.7 - 4.0 (K/uL)    Monocytes Relative 8  3 - 12 (%)    Monocytes Absolute 0.7  0.1 - 1.0 (K/uL)    Eosinophils Relative 3  0 - 5 (%)    Eosinophils Absolute 0.2  0.0 - 0.7 (K/uL)    Basophils Relative 0  0 - 1 (%)    Basophils Absolute 0.0  0.0 - 0.1 (K/uL)   BASIC METABOLIC PANEL     Status: Abnormal   Collection Time   07/30/11  1:56 AM      Component Value Range Comment   Sodium 142  135 - 145 (mEq/L) DELTA CHECK NOTED   Potassium 4.2  3.5 - 5.1 (mEq/L)    Chloride 103  96 - 112 (mEq/L)    CO2 31  19 - 32 (mEq/L)    Glucose, Bld 158 (*) 70 - 99 (mg/dL)    BUN 5 (*) 6 - 23 (mg/dL)    Creatinine, Ser 4.09  0.50 - 1.10 (mg/dL)    Calcium 9.2  8.4 - 10.5 (mg/dL)    GFR calc non Af Amer 65 (*) >90 (mL/min)    GFR calc Af Amer 75 (*) >90 (mL/min)   TROPONIN I     Status: Normal   Collection Time   07/30/11  1:56 AM      Component Value Range Comment   Troponin I <0.30  <0.30 (ng/mL)   PRO B NATRIURETIC PEPTIDE     Status: Abnormal   Collection Time   07/30/11   1:56 AM      Component Value Range Comment   Pro B Natriuretic peptide (BNP) 259.3 (*) 0 - 125 (pg/mL)   BLOOD GAS, ARTERIAL     Status: Abnormal   Collection Time   07/30/11  3:05 AM      Component Value Range Comment   FIO2 0.60      Delivery systems OXYGEN MASK      pH, Arterial 7.244 (*) 7.350 - 7.400     pCO2 arterial 70.0 (*) 35.0 - 45.0 (mmHg)    pO2, Arterial 161.0 (*) 80.0 - 100.0 (mmHg)    Bicarbonate 29.2 (*) 20.0 - 24.0 (mEq/L)    Acid-Base Excess 2.5 (*) 0.0 - 2.0 (mmol/L)    O2 Saturation 98.8      Patient temperature 37.0      Collection site RADIAL      Drawn by COLLECTED BY RT      Sample type ARTERIAL      Allens test (pass/fail) PASS  PASS    BLOOD GAS, ARTERIAL     Status: Abnormal   Collection Time   07/30/11  4:30 AM      Component Value Range Comment   FIO2 1.00      Delivery systems VENTILATOR      Mode PRESSURE REGULATED VOLUME CONTROL      VT 500      Rate 14      Peep/cpap 5.0      pH, Arterial 7.309 (*) 7.350 - 7.400     pCO2 arterial 54.9 (*) 35.0 - 45.0 (mmHg)    pO2, Arterial 400.0 (*) 80.0 -  100.0 (mmHg)    Bicarbonate 26.8 (*) 20.0 - 24.0 (mEq/L)    Acid-Base Excess 1.2  0.0 - 2.0 (mmol/L)    O2 Saturation 99.7      Patient temperature 37.0      Collection site RADIAL      Drawn by COLLECTED BY RT      Sample type ARTERIAL      Allens test (pass/fail) PASS  PASS    URINALYSIS, ROUTINE W REFLEX MICROSCOPIC     Status: Abnormal   Collection Time   07/30/11  4:43 AM      Component Value Range Comment   Color, Urine YELLOW  YELLOW     APPearance CLEAR  CLEAR     Specific Gravity, Urine 1.025  1.005 - 1.030     pH 6.0  5.0 - 8.0     Glucose, UA 100 (*) NEGATIVE (mg/dL)    Hgb urine dipstick MODERATE (*) NEGATIVE     Bilirubin Urine NEGATIVE  NEGATIVE     Ketones, ur NEGATIVE  NEGATIVE (mg/dL)    Protein, ur 30 (*) NEGATIVE (mg/dL)    Urobilinogen, UA 0.2  0.0 - 1.0 (mg/dL)    Nitrite NEGATIVE  NEGATIVE     Leukocytes, UA NEGATIVE   NEGATIVE    URINE MICROSCOPIC-ADD ON     Status: Normal   Collection Time   07/30/11  4:43 AM      Component Value Range Comment   Squamous Epithelial / LPF RARE  RARE     WBC, UA 0-2  <3 (WBC/hpf)    RBC / HPF 3-6  <3 (RBC/hpf)    Bacteria, UA RARE  RARE    URINE RAPID DRUG SCREEN (HOSP PERFORMED)     Status: Abnormal   Collection Time   07/30/11  4:52 AM      Component Value Range Comment   Opiates NONE DETECTED  NONE DETECTED     Cocaine NONE DETECTED  NONE DETECTED     Benzodiazepines POSITIVE (*) NONE DETECTED     Amphetamines NONE DETECTED  NONE DETECTED     Tetrahydrocannabinol NONE DETECTED  NONE DETECTED     Barbiturates NONE DETECTED  NONE DETECTED     Dg Chest Port 1 View  07/30/2011  *RADIOLOGY REPORT*  Clinical Data: Intubation, COPD, asthma, history coronary disease post MI, ischemic cardiomyopathy  PORTABLE CHEST - 1 VIEW  Comparison: Portable exam 0403 hours compared to 07/30/2011 at 0158 hours  Findings: Endotracheal tube in proximal right main stem bronchus; recommend withdrawal 3.5 cm for positioning at a satisfactory level above the carina. Hyperinflation of the right lung with mild volume loss in left lung. Cardiac silhouette appears minimally enlarged. Atherosclerotic calcification aorta. Underlying emphysematous changes. Minimal peribronchial thickening noted. No definite infiltrate.  IMPRESSION: Right mainstem bronchus intubation; recommend withdrawal of endotracheal tube 3.5 cm. Emphysematous and bronchitic changes with hyperinflation of the right lung and mild diffuse left lung atelectasis.  Critical Value/emergent results were called by telephone at the time of interpretation on 07/30/2011  at 0416 hours  to  Darl Pikes RN in ED, who verbally acknowledged these results.  Original Report Authenticated By: Lollie Marrow, M.D.   Dg Chest Port 1 View  07/30/2011  *RADIOLOGY REPORT*  Clinical Data: Dyspnea, back pain, leg pain, COPD, asthma, coronary disease post MI  PORTABLE  CHEST - 1 VIEW  Comparison: Portable exam 0200 hours compared to 07/28/2011  Findings: Enlargement of cardiac silhouette with pulmonary vascular congestion. Atherosclerotic calcification of  mildly tortuous thoracic aorta. Minimal right base atelectasis. Lungs otherwise clear. No pleural effusion or pneumothorax. Bones demineralized.  IMPRESSION: Enlargement of cardiac silhouette with pulmonary vascular congestion. Minimal right base atelectasis.  Original Report Authenticated By: Lollie Marrow, M.D.   Dg Chest Port 1 View  07/28/2011  *RADIOLOGY REPORT*  Clinical Data: Dizziness and nausea.  Recent cardiac arrest.  PORTABLE CHEST - 1 VIEW  Comparison: 07/11/2011.  Findings: Trachea is midline.  Heart size normal.  Lungs are clear. No pleural fluid.  IMPRESSION: No acute findings.  Original Report Authenticated By: Reyes Ivan, M.D.      Assessment Present on Admission:  .Acute respiratory failure, requiring ventilation  .Hypercapnia, secondary to hypoventilation from  sedation and fatigue  .Hyperglycemia .Tobacco abuse .COPD (chronic obstructive pulmonary disease) with acute bronchitis .Lethargy secondary to fatigue and medication  .Other chronic pain   PLAN: Admit this lady to intensive care unit for ventilator support; continue with continuous fentanyl and Versed drip; and consult pulmonary service in the morning. Serial nebulized patient, and intravenous steroids. Will defer decision re antibiotics to the pulmonologist  Orogastric tube for medications, and feeding she is not able to be extubated later today.  Other plans as per orders.  Critical care time: 45 minutes.   Rhaelyn Giron 07/30/2011, 6:16 AM

## 2011-07-30 NOTE — Progress Notes (Signed)
WUA:  Pt is on Versed 1mg  and Fentanyl on ventilator.  Pt opens eyes to light sound and follows commands.  Pt moving all extremities and lifts head off of the bed when asked to do so.  Pt shakes head to answer "yes or no" to questions asked.  Pt indicates that she is not in any pain at this time.  Pt will not be weaning due to less than 24 hours on ventilator.

## 2011-07-30 NOTE — ED Notes (Signed)
Co2 70 Dr. Doylene Canard notified

## 2011-07-30 NOTE — ED Notes (Signed)
Etomidate 20 mg given for intubation at this time, 75mg  succinlycholine given, rt EDP preparing to intubate, b/p 112/79, p 122, 02 sat 97%, r 24, pt now intubated with 8 ET tube secured at 22

## 2011-07-30 NOTE — ED Notes (Signed)
Difficulty breathing, started having problems earlier in the day per EMS. Albuterol neb; duo neb; and 125mg  of Solumedrol given enroute by EMS. C/O back pain with history of spurs in her back.  Duo neb still in process on arrival to the ER. 20g IV started in left [facility] enroute by EMS.

## 2011-07-30 NOTE — ED Notes (Signed)
Friend states that her blood pressure has been running low for some time now at home.

## 2011-07-30 NOTE — ED Notes (Signed)
Kristin Wells, friend, phone #[phone]. Address: [address], [place], [place]  Has taken patients medications, robe, pajama top and bottoms, jacket, and yellow chain with blue Stetzel pendant home with her.

## 2011-07-30 NOTE — Progress Notes (Signed)
Patient admitted after midnight. Patient examined. Chart reviewed. Discussed with Dr. Juanetta Gosling he has consulted. Continue vent support.

## 2011-07-30 NOTE — Progress Notes (Signed)
INITIAL ADULT NUTRITION ASSESSMENT Date: 07/30/2011   Time: 1:50 PM  Reason for Assessment: TF consult for recommedations  ASSESSMENT: Female 61 y.o.  Dx: Acute respiratory failure   Past Medical History  Diagnosis Date  . COPD (chronic obstructive pulmonary disease)   . Asthma   . Bronchitis   . Anxiety   . Back pain   . Demand ischemia of myocardium 06/28/2011  . Thyroid enlargement 06/28/2011  . MI, acute, non ST segment elevation 06/29/2011    Myoview stress test revealed mild perfusion defect  . Ischemic cardiomyopathy 06/28/2011    EF 25%. Myoview stress test later showed ejection fraction of 65%  . PNA (pneumonia) 06/29/2011  . Acute respiratory failure 06/2011    Vent dependent sec to COPD/PNA  . Hypertension   . Coronary artery disease   . Myocardial infarction   . Pneumonia     Scheduled Meds:   . albuterol  2.5 mg Nebulization Q4H   And  . ipratropium  0.5 mg Nebulization Q4H  . albuterol      . albuterol  7.5 mg/hr Nebulization Once  . antiseptic oral rinse  15 mL Mouth Rinse QID  . aspirin  324 mg Oral Once  . aspirin EC  81 mg Oral Daily  . chlorhexidine  15 mL Mouth/Throat BID  . enoxaparin  40 mg Subcutaneous Q24H  . etomidate  20 mg Intravenous Once  . fentaNYL  75 mcg Intravenous Once  . furosemide  20 mg Intravenous Once  .  HYDROmorphone (DILAUDID) injection  1 mg Intravenous Once  . insulin aspart  0-9 Units Subcutaneous Q4H  . ipratropium      . ipratropium  0.5 mg Nebulization Once  . methylPREDNISolone (SOLU-MEDROL) injection  125 mg Intravenous Q6H  . midazolam  2 mg Intravenous Once  . moxifloxacin  400 mg Intravenous Q24H  . naloxone      . nicotine  14 mg Transdermal Daily  . pantoprazole (PROTONIX) IV  80 mg Intravenous Q12H  . polyethylene glycol  17 g Oral Daily  . sodium chloride      . succinylcholine  75 mg Intravenous Once  . DISCONTD: atorvastatin  20 mg Oral q1800  . DISCONTD: gabapentin  300 mg Oral BID  . DISCONTD:  LORazepam  1 mg Intravenous Once  . DISCONTD: pantoprazole  80 mg Oral BID AC  . DISCONTD: PARoxetine  30 mg Oral BH-q7a  . DISCONTD: polyethylene glycol powder  17 g Oral Daily  . DISCONTD: traZODone  100 mg Oral QHS   Continuous Infusions:   . 0.9 % NaCl with KCl 40 mEq / L 100 mL/hr at 07/30/11 1044  . fentaNYL infusion INTRAVENOUS 25 mcg/hr (07/30/11 1000)  . midazolam (VERSED) infusion 1 mg/hr (07/30/11 1000)  . DISCONTD: sodium chloride 125 mL/hr at 07/30/11 0216  . DISCONTD: fentaNYL infusion INTRAVENOUS 50 mcg/hr (07/30/11 0411)  . DISCONTD: midazolam (VERSED) infusion 1 mg/hr (07/30/11 0416)   PRN Meds:.fentaNYL, midazolam  Ht: 5\' 4"  (162.6 cm)  Wt: 140 lb 3.4 oz (63.6 kg)  Ideal Wt: 54.7 kg (120#) % Ideal Wt: 117%  Usual Wt: Wt hx varies from 107# (12/2010) to (155# in late Feb early March 2013)   Body mass index is 24.07 kg/(m^2). Normal   Food/Nutrition Related Hx: Pt unable to provide hx at this time and  no family present.   CMP     Component Value Date/Time   NA 142 07/30/2011 0156   K 4.2 07/30/2011 0156  CL 103 07/30/2011 0156   CO2 31 07/30/2011 0156   GLUCOSE 158* 07/30/2011 0156   BUN 5* 07/30/2011 0156   CREATININE 0.94 07/30/2011 0156   CALCIUM 9.2 07/30/2011 0156   PROT 6.1 07/28/2011 1035   ALBUMIN 2.8* 07/28/2011 1035   AST 27 07/28/2011 1035   ALT 26 07/28/2011 1035   ALKPHOS 75 07/28/2011 1035   BILITOT 0.3 07/28/2011 1035   GFRNONAA 65* 07/30/2011 0156   GFRAA 75* 07/30/2011 0156    Intake/Output Summary (Last 24 hours) at 07/30/11 1359 Last data filed at 07/30/11 1044  Gross per 24 hour  Intake 1162.5 ml  Output    600 ml  Net  562.5 ml    Diet Order: NPO  Supplements/Tube Feeding:none currently  IVF:    0.9 % NaCl with KCl 40 mEq / L Last Rate: 100 mL/hr at 07/30/11 1044  fentaNYL infusion INTRAVENOUS Last Rate: 25 mcg/hr (07/30/11 1000)  midazolam (VERSED) infusion Last Rate: 1 mg/hr (07/30/11 1000)  DISCONTD: sodium chloride Last  Rate: 125 mL/hr at 07/30/11 0216  DISCONTD: fentaNYL infusion INTRAVENOUS Last Rate: 50 mcg/hr (07/30/11 0411)  DISCONTD: midazolam (VERSED) infusion Last Rate: 1 mg/hr (07/30/11 0416)    Estimated Nutritional Needs:   Kcal:1271-1398 kcal per day Protein:89-102 gr per day Fluid:1 ml per kcal minimum  NUTRITION DIAGNOSIS: -Inadequate oral intake (NI-2.1).  Status: Ongoing  RELATED TO: inability to eat  AS EVIDENCE BY: NPO status with mechanical ventilation  MONITORING/EVALUATION(Goals): Monitor: Vent status, TF initiation, Diet advancement as medically appropriate, labs and weights  EDUCATION NEEDS: -Education not appropriate at this time  INTERVENTION: If unable to wean in next 24-48 hr -Rec continuous Osmolite 1.2 via OG tube. Initiate @ 20 ml/hr and increase 10 ml every 4h to goal of 40 ml/hr. Add ProStat 30 ml TID in order to meet 100% of est nutritional needs. Flushes per Adult EN support protocol. At goal rate nutrition support will provide: 1152 kcal, 53 gr protein, 787 ml water from formula and 216 kcal, 45 gr protein from ProStat. Total kcal=1368, Protein=98grams.  Dietitian [phone]  DOCUMENTATION CODES Per approved criteria  -Not Applicable    Kristin Wells 07/30/2011, 1:50 PM

## 2011-07-30 NOTE — ED Provider Notes (Addendum)
History     CSN: [id number]  Arrival date & time 07/30/11  0147   First MD Initiated Contact with Patient 07/30/11 0149      Chief Complaint  Patient presents with  . Shortness of Breath    (Consider location/radiation/quality/duration/timing/severity/associated sxs/prior treatment) HPI Comments: The patient presents with apparent COPD/asthma exacerbation with wheezing in all lung fields and moderate to severe respiratory distress, improving reportedly since EMS had administered DuoNeb and 125 mg Solu-Medrol IV steroid. Initial pulse oximetry readings were in the 80s per EMS, now in the high 90s to 100% on 8 L while getting her breathing treatment.  Patient is a 61 y.o. female presenting with shortness of breath. The history is provided by the patient and medical records. The history is limited by the condition of the patient (The patient is experiencing moderate to severe respiratory distress with wheezing in the lung fields on arrival to the emergency department impeding her ability to give thorough history and review of systems.).  Shortness of Breath  The current episode started today. The onset was gradual. The problem occurs continuously. The problem has been gradually worsening. The problem is moderate. The symptoms are relieved by rest, humidity and beta-agonist inhalers (Insufficient relief from beta agonist inhaled medication). The symptoms are aggravated by activity. Associated symptoms include cough, shortness of breath and wheezing. Pertinent negatives include no chest pain, no chest pressure, no orthopnea, no fever, no rhinorrhea, no sore throat and no stridor. Her past medical history is significant for asthma and past wheezing. Past medical history comments: COPD.    Past Medical History  Diagnosis Date  . COPD (chronic obstructive pulmonary disease)   . Asthma   . Bronchitis   . Anxiety   . Back pain   . Demand ischemia of myocardium 06/28/2011  . Thyroid enlargement  06/28/2011  . MI, acute, non ST segment elevation 06/29/2011    Myoview stress test revealed mild perfusion defect  . Ischemic cardiomyopathy 06/28/2011    EF 25%. Myoview stress test later showed ejection fraction of 65%  . PNA (pneumonia) 06/29/2011  . Acute respiratory failure 06/2011    Vent dependent sec to COPD/PNA    Past Surgical History  Procedure Date  . Abdominal hysterectomy     Family History  Problem Relation Age of Onset  . Diabetes Mother   . Diabetes Father   . Hypertension Mother   . Hypertension Father     History  Substance Use Topics  . Smoking status: Former Smoker    Quit date: 11/14/2010  . Smokeless tobacco: Not on file  . Alcohol Use: No    OB History    Grav Para Term Preterm Abortions TAB SAB Ect Mult Living                  Review of Systems  Unable to perform ROS Constitutional: Negative for fever.  HENT: Negative for sore throat and rhinorrhea.   Respiratory: Positive for cough, shortness of breath and wheezing. Negative for stridor.   Cardiovascular: Negative for chest pain and orthopnea.    Allergies  Review of patient's allergies indicates no known allergies.  Home Medications   Current Outpatient Rx  Name Route Sig Dispense Refill  . ALBUTEROL SULFATE HFA 108 (90 BASE) MCG/ACT IN AERS Inhalation Inhale 1-2 puffs into the lungs every 6 (six) hours as needed for wheezing. 1 Inhaler 0  . ALBUTEROL SULFATE (2.5 MG/3ML) 0.083% IN NEBU Nebulization Take 3 mLs (2.5 mg total) by  nebulization every 6 (six) hours as needed. 75 mL 1  . ALPRAZOLAM 0.5 MG PO TABS Oral Take 0.5 mg by mouth 4 (four) times daily.    . ASPIRIN EC 81 MG PO TBEC Oral Take 1 tablet (81 mg total) by mouth daily. 30 tablet 1  . CYCLOBENZAPRINE HCL 5 MG PO TABS Oral Take 5 mg by mouth 3 (three) times daily as needed.    . DEXLANSOPRAZOLE 60 MG PO CPDR Oral Take 1 capsule (60 mg total) by mouth daily. 30 capsule 1  . FLUTICASONE-SALMETEROL 250-50 MCG/DOSE IN AEPB  Inhalation Inhale 1 puff into the lungs every 12 (twelve) hours. 60 each 1  . GABAPENTIN 300 MG PO CAPS Oral Take 300 mg by mouth 2 (two) times daily.    . GUAIFENESIN ER 600 MG PO TB12 Oral Take 2 tablets (1,200 mg total) by mouth 2 (two) times daily. 14 tablet 0  . OVER THE COUNTER MEDICATION Oral Take 1 capsule by mouth every morning. **Calcium 600 + Vitamin D 1200 Combination Tablet**    . OXYBUTYNIN CHLORIDE ER 5 MG PO TB24 Oral Take 5 mg by mouth daily.    . OXYCODONE-ACETAMINOPHEN 5-325 MG PO TABS Oral Take 0.5 tablets by mouth every 4 (four) hours as needed. Back spurs 30 tablet 0  . PAROXETINE HCL 30 MG PO TABS Oral Take 1 tablet (30 mg total) by mouth every morning. 30 tablet 1  . POLYETHYLENE GLYCOL 3350 PO POWD Oral Take 17 g by mouth daily. 255 g 1  . ROFLUMILAST 500 MCG PO TABS Oral Take 1 tablet (500 mcg total) by mouth daily. 30 tablet 1  . ROSUVASTATIN CALCIUM 10 MG PO TABS Oral Take 10 mg by mouth daily.    Marland Kitchen TIOTROPIUM BROMIDE MONOHYDRATE 18 MCG IN CAPS Inhalation Place 1 capsule (18 mcg total) into inhaler and inhale daily. 30 capsule 1  . TRAZODONE HCL 100 MG PO TABS Oral Take 100 mg by mouth at bedtime.    Marland Kitchen NICOTINE 14 MG/24HR TD PT24 Transdermal Place 1 patch onto the skin daily. 28 patch 0  . ZOLPIDEM TARTRATE 10 MG PO TABS Oral Take 10 mg by mouth at bedtime.      BP 130/72  Pulse 120  Temp(Src) 98.2 F (36.8 C) (Oral)  Resp 44  SpO2 100%  Physical Exam  Nursing note and vitals reviewed. Constitutional: She is oriented to person, place, and time. She appears well-developed and well-nourished. She appears distressed.  HENT:  Head: Normocephalic and atraumatic.  Right Ear: External ear normal.  Left Ear: External ear normal.  Nose: Nose normal.  Mouth/Throat: Oropharynx is clear and moist.  Eyes: Conjunctivae and EOM are normal. Pupils are equal, round, and reactive to light. Right eye exhibits no discharge. Left eye exhibits no discharge.  Neck: Normal range  of motion. Neck supple. No JVD present. No tracheal deviation present.  Cardiovascular: Regular rhythm, normal heart sounds, intact distal pulses and normal pulses.   No extrasystoles are present. Tachycardia present.  Exam reveals no gallop and no friction rub.   No murmur heard. Pulmonary/Chest: Accessory muscle usage present. No stridor. Tachypnea noted. She is in respiratory distress. She has no decreased breath sounds. She has wheezes in the right middle field, the right lower field, the left middle field and the left lower field. She has no rhonchi. She has no rales. She exhibits no tenderness.  Abdominal: Soft. Bowel sounds are normal. She exhibits no distension. There is no tenderness. There is  no rebound and no guarding.  Musculoskeletal: Normal range of motion. She exhibits no edema and no tenderness.  Lymphadenopathy:    She has no cervical adenopathy.  Neurological: She is alert and oriented to person, place, and time. She has normal reflexes. No cranial nerve deficit. She exhibits normal muscle tone. Coordination normal.  Skin: Skin is warm and dry. No rash noted. She is not diaphoretic. No erythema. No pallor.  Psychiatric: Her behavior is normal. Judgment and thought content normal.       The patient appears anxious    ED Course  Procedures (including critical care time)   Date: 07/30/2011  Rate: 129  Rhythm: sinus tachycardia  QRS Axis: normal  Intervals: normal  ST/T Wave abnormalities: nonspecific ST/T changes  Conduction Disutrbances:none  Narrative Interpretation:   Old EKG Reviewed: Sinus tachycardia noted and thought to be due to beta agonist medication administration, no other significant morphologic change since prior tracing.     Labs Reviewed  CBC  DIFFERENTIAL  BASIC METABOLIC PANEL  TROPONIN I  PRO B NATRIURETIC PEPTIDE   Dg Chest Port 1 View  07/28/2011  *RADIOLOGY REPORT*  Clinical Data: Dizziness and nausea.  Recent cardiac arrest.  PORTABLE CHEST  - 1 VIEW  Comparison: 07/11/2011.  Findings: Trachea is midline.  Heart size normal.  Lungs are clear. No pleural fluid.  IMPRESSION: No acute findings.  Original Report Authenticated By: Reyes Ivan, M.D.     No diagnosis found.    MDM  The patient appears most likely to be having COPD or asthma exacerbation with bronchospasm as she has wheezing in all lung fields without any appreciable rales or rhonchi. Question remains if she has any underlying upper respiratory infection, viral syndrome, or pneumonia. The patient has a listed history of ischemic cardiomyopathy and myocardial infarction in the past and so a cardiac process is possible and will be investigated. At present the patient appears to be improving with treatment using bronchodilators and IV steroids.        Felisa Bonier, MD 07/30/11 0219  3:27 AM The patient's mental status has deteriorated, she is somnolent and difficult to arouse by voice, and demonstrates a respiratory acidosis with elevated CO2 on blood gas. She appears to have possible mild congestive heart failure, corroborated by a friend who is showing up in the ER and states the patient has been "drinking a lot of water" over the last several days. I've inform the patient that we will intubate her and put her on the ventilator and she agrees to this.  Felisa Bonier, MD 07/30/11 [phone]   INTUBATION Performed by: Felisa Bonier  Required items: required blood products, implants, devices, and special equipment available Patient identity confirmed: provided demographic data and hospital-assigned identification number Time out: Immediately prior to procedure a "time out" was called to verify the correct patient, procedure, equipment, support staff and site/side marked as required.  Indications: Respiratory acidosis, hypercarbia, altered mental status, ventilatory failure   Intubation method: Glidescope Laryngoscopy   Preoxygenation:  BVM  Sedatives: Etomidate Paralytic: Succinylcholine  Tube Size: 8 cuffed  Post-procedure assessment: chest rise and ETCO2 monitor Breath sounds: equal and absent over the epigastrium Tube secured with: ETT holder Chest x-ray interpreted by radiologist and me.  Chest x-ray findings: endotracheal tube in right mainstem bronchus, pulled back to appropriate position after x-ray review.  Patient tolerated the procedure well with no immediate complications.     Felisa Bonier, MD 07/30/11 2956  [place]  Fredricka Bonine, MD 07/30/11 [phone]

## 2011-07-31 ENCOUNTER — Inpatient Hospital Stay (HOSPITAL_COMMUNITY): Payer: PRIVATE HEALTH INSURANCE

## 2011-07-31 LAB — URINE CULTURE
Colony Count: NO GROWTH
Culture  Setup Time: 201303232121
Culture: NO GROWTH

## 2011-07-31 LAB — GLUCOSE, CAPILLARY
Glucose-Capillary: 130 mg/dL — ABNORMAL HIGH (ref 70–99)
Glucose-Capillary: 137 mg/dL — ABNORMAL HIGH (ref 70–99)
Glucose-Capillary: 153 mg/dL — ABNORMAL HIGH (ref 70–99)
Glucose-Capillary: 165 mg/dL — ABNORMAL HIGH (ref 70–99)
Glucose-Capillary: 172 mg/dL — ABNORMAL HIGH (ref 70–99)
Glucose-Capillary: 188 mg/dL — ABNORMAL HIGH (ref 70–99)

## 2011-07-31 LAB — BLOOD GAS, ARTERIAL
Acid-Base Excess: 1.1 mmol/L (ref 0.0–2.0)
Acid-Base Excess: 1.5 mmol/L (ref 0.0–2.0)
Bicarbonate: 24.3 mEq/L — ABNORMAL HIGH (ref 20.0–24.0)
Bicarbonate: 25.8 mEq/L — ABNORMAL HIGH (ref 20.0–24.0)
FIO2: 40 %
FIO2: 40 %
MECHVT: 500 mL
Mode: POSITIVE
O2 Saturation: 98.8 %
O2 Saturation: 98.9 %
PEEP: 5 cmH2O
PEEP: 5 cmH2O
Patient temperature: 37
Patient temperature: 37
Pressure support: 5 cmH2O
RATE: 14 resp/min
TCO2: 21.6 mmol/L (ref 0–100)
TCO2: 23 mmol/L (ref 0–100)
pCO2 arterial: 32.9 mmHg — ABNORMAL LOW (ref 35.0–45.0)
pCO2 arterial: 41.8 mmHg (ref 35.0–45.0)
pH, Arterial: 7.481 — ABNORMAL HIGH (ref 7.350–7.400)
pH, Arterial: 7.406 — ABNORMAL HIGH (ref 7.350–7.400)
pO2, Arterial: 122 mmHg — ABNORMAL HIGH (ref 80.0–100.0)
pO2, Arterial: 144 mmHg — ABNORMAL HIGH (ref 80.0–100.0)

## 2011-07-31 LAB — CBC
HCT: 36.5 % (ref 36.0–46.0)
Hemoglobin: 12 g/dL (ref 12.0–15.0)
MCH: 29.3 pg (ref 26.0–34.0)
MCHC: 32.9 g/dL (ref 30.0–36.0)
MCV: 89 fL (ref 78.0–100.0)
Platelets: 204 10*3/uL (ref 150–400)
RBC: 4.1 MIL/uL (ref 3.87–5.11)
RDW: 15.5 % (ref 11.5–15.5)
WBC: 11.5 10*3/uL — ABNORMAL HIGH (ref 4.0–10.5)

## 2011-07-31 LAB — BASIC METABOLIC PANEL
BUN: 7 mg/dL (ref 6–23)
CO2: 26 mEq/L (ref 19–32)
Calcium: 9.3 mg/dL (ref 8.4–10.5)
Chloride: 109 mEq/L (ref 96–112)
Creatinine, Ser: 0.78 mg/dL (ref 0.50–1.10)
GFR calc Af Amer: >90 mL/min (ref >90)
GFR calc non Af Amer: 90 mL/min — ABNORMAL LOW (ref >90)
Glucose, Bld: 177 mg/dL — ABNORMAL HIGH (ref 70–99)
Potassium: 4 mEq/L (ref 3.5–5.1)
Sodium: 143 mEq/L (ref 135–145)

## 2011-07-31 LAB — PRO B NATRIURETIC PEPTIDE: Pro B Natriuretic peptide (BNP): 524.8 pg/mL — ABNORMAL HIGH (ref 0–125)

## 2011-07-31 MED ORDER — SODIUM CHLORIDE 0.9 % IJ SOLN
INTRAMUSCULAR | Status: AC
  Filled 2011-07-31: qty 3

## 2011-07-31 MED ORDER — METHYLPREDNISOLONE SODIUM SUCC 125 MG IJ SOLR
60.0000 mg | Freq: Two times a day (BID) | INTRAMUSCULAR | Status: DC
  Administered 2011-07-31 – 2011-08-01 (×2): 60 mg via INTRAVENOUS
  Filled 2011-07-31 (×2): qty 2

## 2011-07-31 MED ORDER — TRAZODONE HCL 50 MG PO TABS
50.0000 mg | ORAL_TABLET | Freq: Every evening | ORAL | Status: DC | PRN
  Administered 2011-07-31 – 2011-08-01 (×2): 50 mg via ORAL
  Filled 2011-07-31 (×2): qty 1

## 2011-07-31 MED ORDER — PAROXETINE HCL 20 MG PO TABS
30.0000 mg | ORAL_TABLET | ORAL | Status: DC
  Administered 2011-08-01 – 2011-08-02 (×2): 30 mg via ORAL
  Filled 2011-07-31 (×2): qty 2

## 2011-07-31 MED ORDER — PANTOPRAZOLE SODIUM 40 MG PO TBEC
80.0000 mg | DELAYED_RELEASE_TABLET | Freq: Two times a day (BID) | ORAL | Status: DC
  Administered 2011-07-31 – 2011-08-02 (×5): 80 mg via ORAL
  Filled 2011-07-31 (×5): qty 2

## 2011-07-31 MED ORDER — ALPRAZOLAM 0.5 MG PO TABS
0.5000 mg | ORAL_TABLET | Freq: Four times a day (QID) | ORAL | Status: DC | PRN
  Administered 2011-07-31 – 2011-08-01 (×3): 0.5 mg via ORAL
  Filled 2011-07-31 (×3): qty 1

## 2011-07-31 NOTE — Progress Notes (Signed)
Discussed with Dr. Juanetta Gosling.  Subjective: unable Objective: Vital signs in last 24 hours: Filed Vitals:   07/31/11 0628 07/31/11 0630 07/31/11 0700 07/31/11 0730  BP:  147/115 118/70 111/70  Pulse:  126    Temp:      TempSrc:      Resp:  21 16   Height:      Weight:      SpO2: 97% 100%     Weight change:   Intake/Output Summary (Last 24 hours) at 07/31/11 0910 Last data filed at 07/31/11 0728  Gross per 24 hour  Intake 3041.67 ml  Output   1550 ml  Net 1491.67 ml   Physical Exam:  General: Intubated. Trying to communicate with gestures Lungs clear to auscultation bilaterally without wheeze rhonchi or rales Cardiovascular regular rate rhythm without murmurs gallops rubs Abdomen obese soft nontender Extremities no clubbing cyanosis or edema  Lab Results: Basic Metabolic Panel:  Lab 07/31/11 7829 07/30/11 0156  NA 143 142  K 4.0 4.2  CL 109 103  CO2 26 31  GLUCOSE 177* 158*  BUN 7 5*  CREATININE 0.78 0.94  CALCIUM 9.3 9.2  MG -- --  PHOS -- --   Liver Function Tests:  Lab 07/28/11 1035  AST 27  ALT 26  ALKPHOS 75  BILITOT 0.3  PROT 6.1  ALBUMIN 2.8*   No results found for this basename: LIPASE:2,AMYLASE:2 in the last 168 hours No results found for this basename: AMMONIA:2 in the last 168 hours CBC:  Lab 07/31/11 0501 07/30/11 0156 07/28/11 1035  WBC 11.5* 9.0 --  NEUTROABS -- 4.8 6.2  HGB 12.0 13.1 --  HCT 36.5 40.8 --  MCV 89.0 90.3 --  PLT 204 208 --   Cardiac Enzymes:  Lab 07/30/11 0641 07/30/11 0156 07/28/11 1035  CKTOTAL 30 -- --  CKMB 1.5 -- --  CKMBINDEX -- -- --  TROPONINI <0.30 <0.30 <0.30   BNP:  Lab 07/31/11 0501 07/30/11 0156  PROBNP 524.8* 259.3*   D-Dimer: No results found for this basename: DDIMER:2 in the last 168 hours CBG:  Lab 07/31/11 0403 07/30/11 2357 07/30/11 1958 07/30/11 1646 07/30/11 1143 07/30/11 0740  GLUCAP 172* 188* 169* 151* 173* 188*   Hemoglobin A1C: No results found for this basename: HGBA1C in  the last 168 hours Fasting Lipid Panel: No results found for this basename: CHOL,HDL,LDLCALC,TRIG,CHOLHDL,LDLDIRECT in the last 562 hours Thyroid Function Tests: No results found for this basename: TSH,T4TOTAL,FREET4,T3FREE,THYROIDAB in the last 168 hours Coagulation: No results found for this basename: LABPROT:4,INR:4 in the last 168 hours Anemia Panel: No results found for this basename: VITAMINB12,FOLATE,FERRITIN,TIBC,IRON,RETICCTPCT in the last 168 hours Urine Drug Screen: Drugs of Abuse     Component Value Date/Time   LABOPIA NONE DETECTED 07/30/2011 0452   COCAINSCRNUR NONE DETECTED 07/30/2011 0452   LABBENZ POSITIVE* 07/30/2011 0452   AMPHETMU NONE DETECTED 07/30/2011 0452   THCU NONE DETECTED 07/30/2011 0452   LABBARB NONE DETECTED 07/30/2011 0452    Alcohol Level: No results found for this basename: ETH:2 in the last 168 hours Urinalysis:  Lab 07/30/11 0443 07/28/11 1244  COLORURINE YELLOW YELLOW  LABSPEC 1.025 1.010  PHURINE 6.0 6.5  GLUCOSEU 100* NEGATIVE  HGBUR MODERATE* TRACE*  BILIRUBINUR NEGATIVE NEGATIVE  KETONESUR NEGATIVE NEGATIVE  PROTEINUR 30* NEGATIVE  UROBILINOGEN 0.2 0.2  NITRITE NEGATIVE NEGATIVE  LEUKOCYTESUR NEGATIVE NEGATIVE   Micro Results: Recent Results (from the past 240 hour(s))  CULTURE, BLOOD (ROUTINE X 2)     Status: Normal (Preliminary  result)   Collection Time   07/28/11 10:35 AM      Component Value Range Status Comment   Specimen Description Blood RIGHT ARM   Final    Special Requests NONE BOTTLES DRAWN AEROBIC AND ANAEROBIC 8 CC EACH   Final    Culture NO GROWTH 3 DAYS   Final    Report Status PENDING   Incomplete   CULTURE, BLOOD (ROUTINE X 2)     Status: Normal (Preliminary result)   Collection Time   07/28/11 10:41 AM      Component Value Range Status Comment   Specimen Description Blood LEFT HAND   Final    Special Requests NONE BOTTLES DRAWN AEROBIC AND ANAEROBIC 8 CC EACH   Final    Culture NO GROWTH 3 DAYS   Final    Report  Status PENDING   Incomplete   MRSA PCR SCREENING     Status: Normal   Collection Time   07/30/11  7:02 AM      Component Value Range Status Comment   MRSA by PCR NEGATIVE  NEGATIVE  Final    Studies/Results: Dg Chest Port 1 View  07/31/2011  *RADIOLOGY REPORT*  Clinical Data: Intubated  PORTABLE CHEST - 1 VIEW  Comparison: 07/30/2011; 07/28/2011  Findings:  Grossly unchanged enlarged cardiac silhouette and mediastinal contours. Interval retraction of endotracheal tube with tip now overlying tracheal air column, tip superior to the carina. Interval placement of enteric tube with tip terminating inferior to the left hemidiaphragm.  Improved aeration of the left lung base with minimal bibasilar heterogeneous opacities.  No pleural effusion or pneumothorax.  Grossly unchanged bones.  IMPRESSION: 1.  Interval retraction of enteric tube with tip now terminating superior to the carina.  No pneumothorax. 2.  Interval placement of enteric tube with tip inferior to the left hemidiaphragm. 3.  Improved aeration of the left lung base with persistent basilar opacities favored to represent atelectasis.  Original Report Authenticated By: Waynard Reeds, M.D.   Dg Chest Port 1 View  07/30/2011  *RADIOLOGY REPORT*  Clinical Data: Intubation, COPD, asthma, history coronary disease post MI, ischemic cardiomyopathy  PORTABLE CHEST - 1 VIEW  Comparison: Portable exam 0403 hours compared to 07/30/2011 at 0158 hours  Findings: Endotracheal tube in proximal right main stem bronchus; recommend withdrawal 3.5 cm for positioning at a satisfactory level above the carina. Hyperinflation of the right lung with mild volume loss in left lung. Cardiac silhouette appears minimally enlarged. Atherosclerotic calcification aorta. Underlying emphysematous changes. Minimal peribronchial thickening noted. No definite infiltrate.  IMPRESSION: Right mainstem bronchus intubation; recommend withdrawal of endotracheal tube 3.5 cm. Emphysematous and  bronchitic changes with hyperinflation of the right lung and mild diffuse left lung atelectasis.  Critical Value/emergent results were called by telephone at the time of interpretation on 07/30/2011  at 0416 hours  to  Darl Pikes RN in ED, who verbally acknowledged these results.  Original Report Authenticated By: Lollie Marrow, M.D.   Dg Chest Port 1 View  07/30/2011  *RADIOLOGY REPORT*  Clinical Data: Dyspnea, back pain, leg pain, COPD, asthma, coronary disease post MI  PORTABLE CHEST - 1 VIEW  Comparison: Portable exam 0200 hours compared to 07/28/2011  Findings: Enlargement of cardiac silhouette with pulmonary vascular congestion. Atherosclerotic calcification of mildly tortuous thoracic aorta. Minimal right base atelectasis. Lungs otherwise clear. No pleural effusion or pneumothorax. Bones demineralized.  IMPRESSION: Enlargement of cardiac silhouette with pulmonary vascular congestion. Minimal right base atelectasis.  Original Report Authenticated By:  MARK ATyron Russell, M.D.   Scheduled Meds:   . albuterol  2.5 mg Nebulization Q4H   And  . ipratropium  0.5 mg Nebulization Q4H  . antiseptic oral rinse  15 mL Mouth Rinse QID  . aspirin EC  81 mg Oral Daily  . chlorhexidine  15 mL Mouth/Throat BID  . enoxaparin  40 mg Subcutaneous Q24H  . insulin aspart  0-9 Units Subcutaneous Q4H  . methylPREDNISolone (SOLU-MEDROL) injection  125 mg Intravenous Q6H  . moxifloxacin  400 mg Intravenous Q24H  . nicotine  14 mg Transdermal Daily  . pantoprazole (PROTONIX) IV  80 mg Intravenous Q12H  . polyethylene glycol  17 g Oral Daily  . sodium chloride      . DISCONTD: atorvastatin  20 mg Oral q1800  . DISCONTD: gabapentin  300 mg Oral BID  . DISCONTD: PARoxetine  30 mg Oral BH-q7a  . DISCONTD: traZODone  100 mg Oral QHS   Continuous Infusions:   . 0.9 % NaCl with KCl 40 mEq / L 100 mL/hr at 07/31/11 0700  . fentaNYL infusion INTRAVENOUS 25 mcg/hr (07/31/11 0700)  . midazolam (VERSED) infusion 2 mg/hr  (07/31/11 0700)  . DISCONTD: midazolam (VERSED) infusion 2 mg/hr (07/30/11 1700)  . DISCONTD: midazolam (VERSED) infusion 2 mg/hr (07/31/11 0016)   PRN Meds:.fentaNYL, midazolam Assessment/Plan: Active Problems:  Acute respiratory failure  COPD (chronic obstructive pulmonary disease) with acute bronchitis  Hypercapnia  DEPRESSION  Other chronic pain  Hyperglycemia  HTN (hypertension)  Tobacco abuse  Anxiety disorder  Lethargy  Wean vent today. Patient has had multiple hospitalizations for COPD exacerbations and respiratory failure. Will discuss the possibility of home BiPAP with nursing staff. I discussed the case with Dr. Kerry Hough who reports that patient continues to smoke according to patient's caseworker. Apparently suffers from a lot of anxiety and has a poor home situation.   LOS: 1 day   Yoshimi Sarr L 07/31/2011, 9:10 AM

## 2011-07-31 NOTE — Progress Notes (Signed)
Pt was extubated at 9:35 and placed on [place]@3L .  Pt is alert and oriented; indicates that throat is sore; voice is hoarse.  Pt is oxgenating at 94% on 3L [place].

## 2011-07-31 NOTE — Progress Notes (Signed)
Subjective: She looks much better and is currently in the process of weaning from mechanical ventilation. She gestures that he would like the endotracheal tube out.  Objective: Vital signs in last 24 hours: Temp:  [97.6 F (36.4 C)-99.2 F (37.3 C)] 97.6 F (36.4 C) (03/24 0400) Pulse Rate:  [93-127] 111  (03/24 0900) Resp:  [1-24] 20  (03/24 0900) BP: (77-147)/(49-115) 109/66 mmHg (03/24 0900) SpO2:  [91 %-100 %] 97 % (03/24 0900) FiO2 (%):  [39.5 %-40.6 %] 40.1 % (03/24 0900) Weight:  [67 kg (147 lb 11.3 oz)] 67 kg (147 lb 11.3 oz) (03/24 0500) Weight change:  Last BM Date: 07/29/11  Intake/Output from previous day: 03/23 0701 - 03/24 0700 In: 3000.7 [I.V.:2430.7; IV Piggyback:450] Out: 1550 [Urine:1550]  PHYSICAL EXAM General appearance: alert, cooperative and mild distress Resp: rhonchi bilaterally Cardio: regular rate and rhythm, S1, S2 normal, no murmur, click, rub or gallop GI: soft, non-tender; bowel sounds normal; no masses,  no organomegaly Extremities: extremities normal, atraumatic, no cyanosis or edema  Lab Results:    Basic Metabolic Panel:  Basename 07/31/11 0501 07/30/11 0156  NA 143 142  K 4.0 4.2  CL 109 103  CO2 26 31  GLUCOSE 177* 158*  BUN 7 5*  CREATININE 0.78 0.94  CALCIUM 9.3 9.2  MG -- --  PHOS -- --   Liver Function Tests:  Basename 07/28/11 1035  AST 27  ALT 26  ALKPHOS 75  BILITOT 0.3  PROT 6.1  ALBUMIN 2.8*   No results found for this basename: LIPASE:2,AMYLASE:2 in the last 72 hours No results found for this basename: AMMONIA:2 in the last 72 hours CBC:  Basename 07/31/11 0501 07/30/11 0156 07/28/11 1035  WBC 11.5* 9.0 --  NEUTROABS -- 4.8 6.2  HGB 12.0 13.1 --  HCT 36.5 40.8 --  MCV 89.0 90.3 --  PLT 204 208 --   Cardiac Enzymes:  Basename 07/30/11 0641 07/30/11 0156 07/28/11 1035  CKTOTAL 30 -- --  CKMB 1.5 -- --  CKMBINDEX -- -- --  TROPONINI <0.30 <0.30 <0.30   BNP:  Basename 07/31/11 0501 07/30/11 0156    PROBNP 524.8* 259.3*   D-Dimer: No results found for this basename: DDIMER:2 in the last 72 hours CBG:  Basename 07/31/11 0403 07/30/11 2357 07/30/11 1958 07/30/11 1646 07/30/11 1143 07/30/11 0740  GLUCAP 172* 188* 169* 151* 173* 188*   Hemoglobin A1C: No results found for this basename: HGBA1C in the last 72 hours Fasting Lipid Panel: No results found for this basename: CHOL,HDL,LDLCALC,TRIG,CHOLHDL,LDLDIRECT in the last 72 hours Thyroid Function Tests: No results found for this basename: TSH,T4TOTAL,FREET4,T3FREE,THYROIDAB in the last 72 hours Anemia Panel: No results found for this basename: VITAMINB12,FOLATE,FERRITIN,TIBC,IRON,RETICCTPCT in the last 72 hours Coagulation: No results found for this basename: LABPROT:2,INR:2 in the last 72 hours Urine Drug Screen: Drugs of Abuse     Component Value Date/Time   LABOPIA NONE DETECTED 07/30/2011 0452   COCAINSCRNUR NONE DETECTED 07/30/2011 0452   LABBENZ POSITIVE* 07/30/2011 0452   AMPHETMU NONE DETECTED 07/30/2011 0452   THCU NONE DETECTED 07/30/2011 0452   LABBARB NONE DETECTED 07/30/2011 0452    Alcohol Level: No results found for this basename: ETH:2 in the last 72 hours Urinalysis:  Basename 07/30/11 0443 07/28/11 1244  COLORURINE YELLOW YELLOW  LABSPEC 1.025 1.010  PHURINE 6.0 6.5  GLUCOSEU 100* NEGATIVE  HGBUR MODERATE* TRACE*  BILIRUBINUR NEGATIVE NEGATIVE  KETONESUR NEGATIVE NEGATIVE  PROTEINUR 30* NEGATIVE  UROBILINOGEN 0.2 0.2  NITRITE NEGATIVE NEGATIVE  LEUKOCYTESUR NEGATIVE NEGATIVE   Misc. Labs:  ABGS  Basename 07/31/11 0720  PHART 7.481*  PO2ART 122.0*  TCO2 21.6  HCO3 24.3*   CULTURES Recent Results (from the past 240 hour(s))  CULTURE, BLOOD (ROUTINE X 2)     Status: Normal (Preliminary result)   Collection Time   07/28/11 10:35 AM      Component Value Range Status Comment   Specimen Description Blood RIGHT ARM   Final    Special Requests NONE BOTTLES DRAWN AEROBIC AND ANAEROBIC 8 CC EACH    Final    Culture NO GROWTH 3 DAYS   Final    Report Status PENDING   Incomplete   CULTURE, BLOOD (ROUTINE X 2)     Status: Normal (Preliminary result)   Collection Time   07/28/11 10:41 AM      Component Value Range Status Comment   Specimen Description Blood LEFT HAND   Final    Special Requests NONE BOTTLES DRAWN AEROBIC AND ANAEROBIC 8 CC EACH   Final    Culture NO GROWTH 3 DAYS   Final    Report Status PENDING   Incomplete   MRSA PCR SCREENING     Status: Normal   Collection Time   07/30/11  7:02 AM      Component Value Range Status Comment   MRSA by PCR NEGATIVE  NEGATIVE  Final    Studies/Results: Dg Chest Port 1 View  07/31/2011  *RADIOLOGY REPORT*  Clinical Data: Intubated  PORTABLE CHEST - 1 VIEW  Comparison: 07/30/2011; 07/28/2011  Findings:  Grossly unchanged enlarged cardiac silhouette and mediastinal contours. Interval retraction of endotracheal tube with tip now overlying tracheal air column, tip superior to the carina. Interval placement of enteric tube with tip terminating inferior to the left hemidiaphragm.  Improved aeration of the left lung base with minimal bibasilar heterogeneous opacities.  No pleural effusion or pneumothorax.  Grossly unchanged bones.  IMPRESSION: 1.  Interval retraction of enteric tube with tip now terminating superior to the carina.  No pneumothorax. 2.  Interval placement of enteric tube with tip inferior to the left hemidiaphragm. 3.  Improved aeration of the left lung base with persistent basilar opacities favored to represent atelectasis.  Original Report Authenticated By: Waynard Reeds, M.D.   Dg Chest Port 1 View  07/30/2011  *RADIOLOGY REPORT*  Clinical Data: Intubation, COPD, asthma, history coronary disease post MI, ischemic cardiomyopathy  PORTABLE CHEST - 1 VIEW  Comparison: Portable exam 0403 hours compared to 07/30/2011 at 0158 hours  Findings: Endotracheal tube in proximal right main stem bronchus; recommend withdrawal 3.5 cm for  positioning at a satisfactory level above the carina. Hyperinflation of the right lung with mild volume loss in left lung. Cardiac silhouette appears minimally enlarged. Atherosclerotic calcification aorta. Underlying emphysematous changes. Minimal peribronchial thickening noted. No definite infiltrate.  IMPRESSION: Right mainstem bronchus intubation; recommend withdrawal of endotracheal tube 3.5 cm. Emphysematous and bronchitic changes with hyperinflation of the right lung and mild diffuse left lung atelectasis.  Critical Value/emergent results were called by telephone at the time of interpretation on 07/30/2011  at 0416 hours  to  Darl Pikes RN in ED, who verbally acknowledged these results.  Original Report Authenticated By: Lollie Marrow, M.D.   Dg Chest Port 1 View  07/30/2011  *RADIOLOGY REPORT*  Clinical Data: Dyspnea, back pain, leg pain, COPD, asthma, coronary disease post MI  PORTABLE CHEST - 1 VIEW  Comparison: Portable exam 0200 hours compared to 07/28/2011  Findings:  Enlargement of cardiac silhouette with pulmonary vascular congestion. Atherosclerotic calcification of mildly tortuous thoracic aorta. Minimal right base atelectasis. Lungs otherwise clear. No pleural effusion or pneumothorax. Bones demineralized.  IMPRESSION: Enlargement of cardiac silhouette with pulmonary vascular congestion. Minimal right base atelectasis.  Original Report Authenticated By: Lollie Marrow, M.D.    Medications:  Scheduled:   . albuterol  2.5 mg Nebulization Q4H   And  . ipratropium  0.5 mg Nebulization Q4H  . antiseptic oral rinse  15 mL Mouth Rinse QID  . aspirin EC  81 mg Oral Daily  . chlorhexidine  15 mL Mouth/Throat BID  . enoxaparin  40 mg Subcutaneous Q24H  . insulin aspart  0-9 Units Subcutaneous Q4H  . methylPREDNISolone (SOLU-MEDROL) injection  60 mg Intravenous Q12H  . moxifloxacin  400 mg Intravenous Q24H  . nicotine  14 mg Transdermal Daily  . pantoprazole (PROTONIX) IV  80 mg Intravenous Q12H    . polyethylene glycol  17 g Oral Daily  . sodium chloride      . DISCONTD: atorvastatin  20 mg Oral q1800  . DISCONTD: gabapentin  300 mg Oral BID  . DISCONTD: methylPREDNISolone (SOLU-MEDROL) injection  125 mg Intravenous Q6H  . DISCONTD: PARoxetine  30 mg Oral BH-q7a  . DISCONTD: traZODone  100 mg Oral QHS   Continuous:   . 0.9 % NaCl with KCl 40 mEq / L 100 mL/hr at 07/31/11 0700  . fentaNYL infusion INTRAVENOUS 25 mcg/hr (07/31/11 0700)  . midazolam (VERSED) infusion 2 mg/hr (07/31/11 0700)  . DISCONTD: midazolam (VERSED) infusion 2 mg/hr (07/30/11 1700)  . DISCONTD: midazolam (VERSED) infusion 2 mg/hr (07/31/11 0016)   ZHY:QMVHQION, midazolam  Assesment: She has acute respiratory failure. She probably has some element of chronic respiratory failure. She has been in and out of the hospital. I mistakenly stated on my consultation note that she was intubated on her last admission but she only required BiPAP that time. She was intubated the admission before that. At any rate she has significant problems with COPD and respiratory failure and has been on ventilator support at least twice. Active Problems:  DEPRESSION  Other chronic pain  Acute respiratory failure  Hyperglycemia  COPD (chronic obstructive pulmonary disease) with acute bronchitis  HTN (hypertension)  Tobacco abuse  Anxiety disorder  Hypercapnia  Lethargy    Plan: I discussed her situation with Dr. Lendell Caprice who is the hospitalist attending. I wonder if she has some adrenal insufficiency from chronic steroid use or if she would benefit from BiPAP at home. Our plan would be to involve case management and to try a long steroid taper this time. I think she'll be able to be extubated later this morning    LOS: 1 day   Ritamarie Arkin L 07/31/2011, 9:18 AM

## 2011-07-31 NOTE — Consult Note (Signed)
NAMEKIYLAH, LOYER NO.:  [id number]  MEDICAL RECORD NO.:  [id number]  LOCATION:  IC06                          FACILITY:  [facility]  PHYSICIAN:  Godfrey Tritschler L. Juanetta Gosling, M.D.DATE OF BIRTH:  [birth year]  DATE OF CONSULTATION: DATE OF DISCHARGE:                                CONSULTATION   REASON FOR CONSULTATION:  Respiratory failure.  HISTORY:  Ms. Kaiser is a 61 year old who has been in and out of the hospital on several occasions with respiratory failure.  She had apparently been in the hospital and intubated about 2 weeks ago and then was able to be discharged.  She has been more short of breath for the last several days.  She has been using her nebulizer but then developed increasing problems and eventually severe respiratory distress.  She was brought to the emergency room where she was intubated.  She does have problems with severe chronic pain and she has had abuse of chronic narcotics because of that.  PAST MEDICAL HISTORY:  Positive for COPD.  She has anxiety.  She has chronic back pain, history of non-ST-elevation myocardial infarction thought to be related more to a demand issue while she was in respiratory failure with ischemic cardiomyopathy, and she has had at least one other episode of acute respiratory failure requiring ventilator support.  SOCIAL HISTORY:  She says that although she has a significant smoking history, she stopped about 8 months ago.  She does not use any alcohol or illicit drugs.  She lives at home with family.  FAMILY HISTORY:  Positive for diabetes and hypertension.  REVIEW OF SYSTEMS:  Unobtainable because she is on a ventilator.  PHYSICAL EXAMINATION:  GENERAL:  A well-developed female who is intubated on the ventilator.  She is in no acute distress.  She is sedated. HEENT:  Her pupils are reactive.  Nose and throat are clear. NECK:  Supple without masses. CHEST:  Rhonchi bilaterally. HEART:  Regular. ABDOMEN:  Soft.  She  has no edema.  Chest x-ray does not show a definite pneumonia.  Her first blood gas on the ventilator is still pending.  ASSESSMENT:  She has ventilator-dependent respiratory failure.  She has just been intubated, so clearly we are going to try to wean her today and my plan will be to continue with her medications and treatments.  I have added an antibiotic to her regimen and we will continue with everything else.     Matayah Reyburn L. Juanetta Gosling, M.D.     ELH/MEDQ  D:  07/30/2011  T:  07/31/2011  Job:  [id number]

## 2011-07-31 NOTE — Plan of Care (Signed)
Problem: Phase II Progression Outcomes Goal: Date pt extubated/weaned off vent Outcome: Completed/Met Date Met:  07/31/11 Extubated 07/31/2011 at 9:35am Goal: Time pt extubated/weaned off vent Outcome: Completed/Met Date Met:  07/31/11 09:35am

## 2011-07-31 NOTE — Procedures (Signed)
Extubation Procedure Note RT placed patient on wean CPAP/PS 5/5 40% FIO2 at 0825, patient tolerated well . Weaning parameters done,patient performed -20 on NIF and VC 1.2L, ABG done and results called to MD. RT recived orders to extubate. RT exdtubated patient with no complications noted to 3L O2 via [place]. Patient remained stable throughtout SATs 94%, HR 110 and BP 112/77. RN at bedside and RT will continue to monitor. Patient Details:   Name: Kristin Wells DOB: 05/05/1951 MRN: [id number]   Airway Documentation:  Airway (Active)  Secured at (cm) 20 cm 07/31/2011  8:25 AM  Measured From Lips 07/31/2011  8:25 AM  Secured Location Center 07/31/2011  8:25 AM  Secured By [name] 07/31/2011  7:56 AM  Tube Holder Repositioned Yes 07/31/2011  8:25 AM  Cuff Pressure (cm H2O) 22 cm H2O 07/30/2011  8:23 PM  Site Condition Dry 07/31/2011  8:25 AM    Evaluation  O2 sats: stable throughout Complications: No apparent complications Patient did tolerate procedure well. Bilateral Breath Sounds: Diminished;Rhonchi   Yes  Cloretta Ned 07/31/2011, 9:37 AM

## 2011-08-01 ENCOUNTER — Ambulatory Visit: Payer: PRIVATE HEALTH INSURANCE | Admitting: Adult Health

## 2011-08-01 LAB — BASIC METABOLIC PANEL
BUN: 13 mg/dL (ref 6–23)
CO2: 29 mEq/L (ref 19–32)
Calcium: 9.3 mg/dL (ref 8.4–10.5)
Chloride: 102 mEq/L (ref 96–112)
Creatinine, Ser: 0.74 mg/dL (ref 0.50–1.10)
GFR calc Af Amer: >90 mL/min (ref >90)
GFR calc non Af Amer: >90 mL/min (ref >90)
Glucose, Bld: 189 mg/dL — ABNORMAL HIGH (ref 70–99)
Potassium: 3.8 mEq/L (ref 3.5–5.1)
Sodium: 138 mEq/L (ref 135–145)

## 2011-08-01 LAB — GLUCOSE, CAPILLARY
Glucose-Capillary: 114 mg/dL — ABNORMAL HIGH (ref 70–99)
Glucose-Capillary: 148 mg/dL — ABNORMAL HIGH (ref 70–99)
Glucose-Capillary: 174 mg/dL — ABNORMAL HIGH (ref 70–99)
Glucose-Capillary: 186 mg/dL — ABNORMAL HIGH (ref 70–99)
Glucose-Capillary: 186 mg/dL — ABNORMAL HIGH (ref 70–99)
Glucose-Capillary: 234 mg/dL — ABNORMAL HIGH (ref 70–99)

## 2011-08-01 MED ORDER — NICOTINE 7 MG/24HR TD PT24
7.0000 mg | MEDICATED_PATCH | Freq: Every day | TRANSDERMAL | Status: DC
  Administered 2011-08-01 – 2011-08-02 (×2): 7 mg via TRANSDERMAL
  Filled 2011-08-01 (×3): qty 1

## 2011-08-01 MED ORDER — ZOLPIDEM TARTRATE 5 MG PO TABS
5.0000 mg | ORAL_TABLET | Freq: Every evening | ORAL | Status: DC | PRN
  Administered 2011-08-01: 5 mg via ORAL
  Filled 2011-08-01: qty 1

## 2011-08-01 MED ORDER — IPRATROPIUM BROMIDE 0.02 % IN SOLN: 0.5000 mg | RESPIRATORY_TRACT | Status: DC

## 2011-08-01 MED ORDER — ALBUTEROL SULFATE (5 MG/ML) 0.5% IN NEBU: 2.5000 mg | INHALATION_SOLUTION | Freq: Four times a day (QID) | RESPIRATORY_TRACT | Status: DC

## 2011-08-01 MED ORDER — IPRATROPIUM BROMIDE 0.02 % IN SOLN
0.5000 mg | RESPIRATORY_TRACT | Status: DC
  Administered 2011-08-01 – 2011-08-02 (×3): 0.5 mg via RESPIRATORY_TRACT
  Filled 2011-08-01 (×3): qty 2.5

## 2011-08-01 MED ORDER — MOXIFLOXACIN HCL 400 MG PO TABS
400.0000 mg | ORAL_TABLET | Freq: Every day | ORAL | Status: DC
  Administered 2011-08-01: 400 mg via ORAL
  Filled 2011-08-01: qty 1

## 2011-08-01 MED ORDER — OXYCODONE-ACETAMINOPHEN 5-325 MG PO TABS: 0.5000 | ORAL_TABLET | Freq: Four times a day (QID) | ORAL | Status: DC | PRN

## 2011-08-01 MED ORDER — OXYBUTYNIN CHLORIDE ER 5 MG PO TB24
5.0000 mg | ORAL_TABLET | Freq: Every day | ORAL | Status: DC
  Administered 2011-08-01 – 2011-08-02 (×2): 5 mg via ORAL
  Filled 2011-08-01 (×2): qty 1

## 2011-08-01 MED ORDER — POLYETHYLENE GLYCOL 3350 17 G PO PACK: 17.0000 g | PACK | Freq: Every day | ORAL | Status: DC | PRN

## 2011-08-01 MED ORDER — SIMVASTATIN 20 MG PO TABS
20.0000 mg | ORAL_TABLET | Freq: Every day | ORAL | Status: DC
  Administered 2011-08-01 – 2011-08-02 (×2): 20 mg via ORAL
  Filled 2011-08-01 (×2): qty 1

## 2011-08-01 MED ORDER — GABAPENTIN 300 MG PO CAPS
300.0000 mg | ORAL_CAPSULE | Freq: Two times a day (BID) | ORAL | Status: DC
  Administered 2011-08-01 – 2011-08-02 (×3): 300 mg via ORAL
  Filled 2011-08-01 (×3): qty 1

## 2011-08-01 MED ORDER — IPRATROPIUM BROMIDE 0.02 % IN SOLN: 0.5000 mg | Freq: Four times a day (QID) | RESPIRATORY_TRACT | Status: DC

## 2011-08-01 MED ORDER — ALBUTEROL SULFATE (5 MG/ML) 0.5% IN NEBU
2.5000 mg | INHALATION_SOLUTION | RESPIRATORY_TRACT | Status: DC
  Administered 2011-08-01 – 2011-08-02 (×3): 2.5 mg via RESPIRATORY_TRACT
  Filled 2011-08-01 (×3): qty 0.5

## 2011-08-01 MED ORDER — FLUTICASONE-SALMETEROL 250-50 MCG/DOSE IN AEPB
1.0000 | INHALATION_SPRAY | Freq: Two times a day (BID) | RESPIRATORY_TRACT | Status: DC
  Administered 2011-08-01 – 2011-08-02 (×3): 1 via RESPIRATORY_TRACT
  Filled 2011-08-01: qty 14

## 2011-08-01 MED ORDER — PREDNISONE 20 MG PO TABS
40.0000 mg | ORAL_TABLET | Freq: Two times a day (BID) | ORAL | Status: DC
Start: 2011-08-01 — End: 2011-08-02
  Administered 2011-08-01 – 2011-08-02 (×3): 40 mg via ORAL
  Filled 2011-08-01 (×3): qty 2

## 2011-08-01 NOTE — Progress Notes (Signed)
UR Chart Review Completed  

## 2011-08-01 NOTE — Progress Notes (Signed)
Subjective: This feels better. Wants to go home. Reports last cigarette was 2 months ago, but has been exposed to secondhand smoke. Reports a "head cold" prior to admission. Patient is looking into moving.  Objective: Vital signs in last 24 hours: Filed Vitals:   08/01/11 0500 08/01/11 0600 08/01/11 0650 08/01/11 0700  BP: 128/81 121/78  124/73  Pulse: 97 95  93  Temp:      TempSrc:      Resp: 20 21  23   Height:      Weight: 65.3 kg (143 lb 15.4 oz)     SpO2: 98% 98% 97% 97%   Weight change: 1.7 kg (3 lb 12 oz)  Intake/Output Summary (Last 24 hours) at 08/01/11 1000 Last data filed at 08/01/11 0700  Gross per 24 hour  Intake   1620 ml  Output   1450 ml  Net    170 ml   Physical Exam:  General: Comfortable. Talkative. Lungs clear to auscultation bilaterally without wheeze rhonchi or rales Cardiovascular regular rate rhythm without murmurs gallops rubs Abdomen obese soft nontender Extremities no clubbing cyanosis or edema  Lab Results: Basic Metabolic Panel:  Lab 08/01/11 4098 07/31/11 0501  NA 138 143  K 3.8 4.0  CL 102 109  CO2 29 26  GLUCOSE 189* 177*  BUN 13 7  CREATININE 0.74 0.78  CALCIUM 9.3 9.3  MG -- --  PHOS -- --   Liver Function Tests:  Lab 07/28/11 1035  AST 27  ALT 26  ALKPHOS 75  BILITOT 0.3  PROT 6.1  ALBUMIN 2.8*   No results found for this basename: LIPASE:2,AMYLASE:2 in the last 168 hours No results found for this basename: AMMONIA:2 in the last 168 hours CBC:  Lab 07/31/11 0501 07/30/11 0156 07/28/11 1035  WBC 11.5* 9.0 --  NEUTROABS -- 4.8 6.2  HGB 12.0 13.1 --  HCT 36.5 40.8 --  MCV 89.0 90.3 --  PLT 204 208 --   Cardiac Enzymes:  Lab 07/30/11 0641 07/30/11 0156 07/28/11 1035  CKTOTAL 30 -- --  CKMB 1.5 -- --  CKMBINDEX -- -- --  TROPONINI <0.30 <0.30 <0.30   BNP:  Lab 07/31/11 0501 07/30/11 0156  PROBNP 524.8* 259.3*   D-Dimer: No results found for this basename: DDIMER:2 in the last 168 hours CBG:  Lab  08/01/11 0729 08/01/11 0332 07/31/11 2344 07/31/11 1945 07/31/11 1616 07/31/11 1128  GLUCAP 148* 186* 174* 137* 153* 165*   Hemoglobin A1C: No results found for this basename: HGBA1C in the last 168 hours Fasting Lipid Panel: No results found for this basename: CHOL,HDL,LDLCALC,TRIG,CHOLHDL,LDLDIRECT in the last 119 hours Thyroid Function Tests: No results found for this basename: TSH,T4TOTAL,FREET4,T3FREE,THYROIDAB in the last 168 hours Coagulation: No results found for this basename: LABPROT:4,INR:4 in the last 168 hours Anemia Panel: No results found for this basename: VITAMINB12,FOLATE,FERRITIN,TIBC,IRON,RETICCTPCT in the last 168 hours Urine Drug Screen: Drugs of Abuse     Component Value Date/Time   LABOPIA NONE DETECTED 07/30/2011 0452   COCAINSCRNUR NONE DETECTED 07/30/2011 0452   LABBENZ POSITIVE* 07/30/2011 0452   AMPHETMU NONE DETECTED 07/30/2011 0452   THCU NONE DETECTED 07/30/2011 0452   LABBARB NONE DETECTED 07/30/2011 0452    Alcohol Level: No results found for this basename: ETH:2 in the last 168 hours Urinalysis:  Lab 07/30/11 0443 07/28/11 1244  COLORURINE YELLOW YELLOW  LABSPEC 1.025 1.010  PHURINE 6.0 6.5  GLUCOSEU 100* NEGATIVE  HGBUR MODERATE* TRACE*  BILIRUBINUR NEGATIVE NEGATIVE  KETONESUR NEGATIVE NEGATIVE  PROTEINUR 30* NEGATIVE  UROBILINOGEN 0.2 0.2  NITRITE NEGATIVE NEGATIVE  LEUKOCYTESUR NEGATIVE NEGATIVE   Micro Results: Recent Results (from the past 240 hour(s))  CULTURE, BLOOD (ROUTINE X 2)     Status: Normal (Preliminary result)   Collection Time   07/28/11 10:35 AM      Component Value Range Status Comment   Specimen Description Blood RIGHT ARM   Final    Special Requests NONE BOTTLES DRAWN AEROBIC AND ANAEROBIC 8 CC EACH   Final    Culture NO GROWTH 3 DAYS   Final    Report Status PENDING   Incomplete   CULTURE, BLOOD (ROUTINE X 2)     Status: Normal (Preliminary result)   Collection Time   07/28/11 10:41 AM      Component Value Range  Status Comment   Specimen Description Blood LEFT HAND   Final    Special Requests NONE BOTTLES DRAWN AEROBIC AND ANAEROBIC 8 CC EACH   Final    Culture NO GROWTH 3 DAYS   Final    Report Status PENDING   Incomplete   URINE CULTURE     Status: Normal   Collection Time   07/30/11  4:43 AM      Component Value Range Status Comment   Specimen Description URINE, CATHETERIZED   Final    Special Requests NONE   Final    Culture  Setup Time [id number]   Final    Colony Count NO GROWTH   Final    Culture NO GROWTH   Final    Report Status 07/31/2011 FINAL   Final   MRSA PCR SCREENING     Status: Normal   Collection Time   07/30/11  7:02 AM      Component Value Range Status Comment   MRSA by PCR NEGATIVE  NEGATIVE  Final    Studies/Results: Dg Chest Port 1 View  07/31/2011  *RADIOLOGY REPORT*  Clinical Data: Intubated  PORTABLE CHEST - 1 VIEW  Comparison: 07/30/2011; 07/28/2011  Findings:  Grossly unchanged enlarged cardiac silhouette and mediastinal contours. Interval retraction of endotracheal tube with tip now overlying tracheal air column, tip superior to the carina. Interval placement of enteric tube with tip terminating inferior to the left hemidiaphragm.  Improved aeration of the left lung base with minimal bibasilar heterogeneous opacities.  No pleural effusion or pneumothorax.  Grossly unchanged bones.  IMPRESSION: 1.  Interval retraction of enteric tube with tip now terminating superior to the carina.  No pneumothorax. 2.  Interval placement of enteric tube with tip inferior to the left hemidiaphragm. 3.  Improved aeration of the left lung base with persistent basilar opacities favored to represent atelectasis.  Original Report Authenticated By: Waynard Reeds, M.D.   Scheduled Meds:    . albuterol  2.5 mg Nebulization Q4H   And  . ipratropium  0.5 mg Nebulization Q4H  . aspirin EC  81 mg Oral Daily  . enoxaparin  40 mg Subcutaneous Q24H  . Fluticasone-Salmeterol  1 puff Inhalation  Q12H  . gabapentin  300 mg Oral BID  . insulin aspart  0-9 Units Subcutaneous Q4H  . moxifloxacin  400 mg Oral q1800  . nicotine  7 mg Transdermal Daily  . oxybutynin  5 mg Oral Daily  . pantoprazole  80 mg Oral BID AC  . PARoxetine  30 mg Oral BH-q7a  . predniSONE  40 mg Oral BID  . simvastatin  20 mg Oral Daily  . sodium chloride      .  DISCONTD: antiseptic oral rinse  15 mL Mouth Rinse QID  . DISCONTD: chlorhexidine  15 mL Mouth/Throat BID  . DISCONTD: methylPREDNISolone (SOLU-MEDROL) injection  60 mg Intravenous Q12H  . DISCONTD: moxifloxacin  400 mg Intravenous Q24H  . DISCONTD: nicotine  14 mg Transdermal Daily  . DISCONTD: pantoprazole (PROTONIX) IV  80 mg Intravenous Q12H  . DISCONTD: polyethylene glycol  17 g Oral Daily   Continuous Infusions:    . 0.9 % NaCl with KCl 40 mEq / L 20 mL/hr at 07/31/11 1800  . DISCONTD: fentaNYL infusion INTRAVENOUS Stopped (07/31/11 0930)  . DISCONTD: midazolam (VERSED) infusion Stopped (07/31/11 0930)   PRN Meds:.ALPRAZolam, oxyCODONE-acetaminophen, polyethylene glycol, traZODone, zolpidem, DISCONTD: fentaNYL, DISCONTD: midazolam Assessment/Plan: Active Problems:  Acute respiratory failure  COPD (chronic obstructive pulmonary disease) with acute bronchitis  Hypercapnia  DEPRESSION  Other chronic pain  Hyperglycemia  HTN (hypertension)  Tobacco abuse  Anxiety disorder  Much better today. Change antibiotics and steroids by mouth. Transferred to the floor. Increase activity. Will discuss with care management whether patient would qualify for home BiPAP. Resume Neurontin and other home medications.   LOS: 2 days   Brittan Mapel L 08/01/2011, 10:00 AM

## 2011-08-01 NOTE — Progress Notes (Signed)
Report given to [facility], LPN. Pt alert, oriented and in stable condition at the time of transport.

## 2011-08-01 NOTE — Progress Notes (Signed)
Subjective: She says she feels well. She wants to be considered for discharge. She was able to be extubated yesterday. She has no new complaints.  Objective: Vital signs in last 24 hours: Temp:  [97.7 F (36.5 C)-98.9 F (37.2 C)] 97.8 F (36.6 C) (03/25 0400) Pulse Rate:  [93-114] 93  (03/25 0700) Resp:  [15-27] 23  (03/25 0700) BP: (79-143)/(53-91) 124/73 mmHg (03/25 0700) SpO2:  [93 %-100 %] 97 % (03/25 0700) FiO2 (%):  [39.7 %-40.1 %] 40.1 % (03/24 0930) Weight:  [65.3 kg (143 lb 15.4 oz)] 65.3 kg (143 lb 15.4 oz) (03/25 0500) Weight change: 1.7 kg (3 lb 12 oz) Last BM Date: 07/29/11  Intake/Output from previous day: 03/24 0701 - 03/25 0700 In: 2010 [P.O.:1200; I.V.:560; IV Piggyback:250] Out: 1650 [Urine:1450; Emesis/NG output:200]  PHYSICAL EXAM General appearance: alert, cooperative and no distress Resp: rhonchi bilaterally Cardio: regular rate and rhythm, S1, S2 normal, no murmur, click, rub or gallop GI: soft, non-tender; bowel sounds normal; no masses,  no organomegaly Extremities: extremities normal, atraumatic, no cyanosis or edema  Lab Results:    Basic Metabolic Panel:  Basename 08/01/11 0428 07/31/11 0501  NA 138 143  K 3.8 4.0  CL 102 109  CO2 29 26  GLUCOSE 189* 177*  BUN 13 7  CREATININE 0.74 0.78  CALCIUM 9.3 9.3  MG -- --  PHOS -- --   Liver Function Tests: No results found for this basename: AST:2,ALT:2,ALKPHOS:2,BILITOT:2,PROT:2,ALBUMIN:2 in the last 72 hours No results found for this basename: LIPASE:2,AMYLASE:2 in the last 72 hours No results found for this basename: AMMONIA:2 in the last 72 hours CBC:  Basename 07/31/11 0501 07/30/11 0156  WBC 11.5* 9.0  NEUTROABS -- 4.8  HGB 12.0 13.1  HCT 36.5 40.8  MCV 89.0 90.3  PLT 204 208   Cardiac Enzymes:  Basename 07/30/11 0641 07/30/11 0156  CKTOTAL 30 --  CKMB 1.5 --  CKMBINDEX -- --  TROPONINI <0.30 <0.30   BNP:  Basename 07/31/11 0501 07/30/11 0156  PROBNP 524.8* 259.3*    D-Dimer: No results found for this basename: DDIMER:2 in the last 72 hours CBG:  Basename 08/01/11 0332 07/31/11 2344 07/31/11 1945 07/31/11 1616 07/31/11 1128 07/31/11 0726  GLUCAP 186* 174* 137* 153* 165* 130*   Hemoglobin A1C: No results found for this basename: HGBA1C in the last 72 hours Fasting Lipid Panel: No results found for this basename: CHOL,HDL,LDLCALC,TRIG,CHOLHDL,LDLDIRECT in the last 72 hours Thyroid Function Tests: No results found for this basename: TSH,T4TOTAL,FREET4,T3FREE,THYROIDAB in the last 72 hours Anemia Panel: No results found for this basename: VITAMINB12,FOLATE,FERRITIN,TIBC,IRON,RETICCTPCT in the last 72 hours Coagulation: No results found for this basename: LABPROT:2,INR:2 in the last 72 hours Urine Drug Screen: Drugs of Abuse     Component Value Date/Time   LABOPIA NONE DETECTED 07/30/2011 0452   COCAINSCRNUR NONE DETECTED 07/30/2011 0452   LABBENZ POSITIVE* 07/30/2011 0452   AMPHETMU NONE DETECTED 07/30/2011 0452   THCU NONE DETECTED 07/30/2011 0452   LABBARB NONE DETECTED 07/30/2011 0452    Alcohol Level: No results found for this basename: ETH:2 in the last 72 hours Urinalysis:  Basename 07/30/11 0443  COLORURINE YELLOW  LABSPEC 1.025  PHURINE 6.0  GLUCOSEU 100*  HGBUR MODERATE*  BILIRUBINUR NEGATIVE  KETONESUR NEGATIVE  PROTEINUR 30*  UROBILINOGEN 0.2  NITRITE NEGATIVE  LEUKOCYTESUR NEGATIVE   Misc. Labs:  ABGS  Basename 07/31/11 0910  PHART 7.406*  PO2ART 144.0*  TCO2 23.0  HCO3 25.8*   CULTURES Recent Results (from the past  240 hour(s))  CULTURE, BLOOD (ROUTINE X 2)     Status: Normal (Preliminary result)   Collection Time   07/28/11 10:35 AM      Component Value Range Status Comment   Specimen Description Blood RIGHT ARM   Final    Special Requests NONE BOTTLES DRAWN AEROBIC AND ANAEROBIC 8 CC EACH   Final    Culture NO GROWTH 3 DAYS   Final    Report Status PENDING   Incomplete   CULTURE, BLOOD (ROUTINE X 2)      Status: Normal (Preliminary result)   Collection Time   07/28/11 10:41 AM      Component Value Range Status Comment   Specimen Description Blood LEFT HAND   Final    Special Requests NONE BOTTLES DRAWN AEROBIC AND ANAEROBIC 8 CC EACH   Final    Culture NO GROWTH 3 DAYS   Final    Report Status PENDING   Incomplete   URINE CULTURE     Status: Normal   Collection Time   07/30/11  4:43 AM      Component Value Range Status Comment   Specimen Description URINE, CATHETERIZED   Final    Special Requests NONE   Final    Culture  Setup Time [id number]   Final    Colony Count NO GROWTH   Final    Culture NO GROWTH   Final    Report Status 07/31/2011 FINAL   Final   MRSA PCR SCREENING     Status: Normal   Collection Time   07/30/11  7:02 AM      Component Value Range Status Comment   MRSA by PCR NEGATIVE  NEGATIVE  Final    Studies/Results: Dg Chest Port 1 View  07/31/2011  *RADIOLOGY REPORT*  Clinical Data: Intubated  PORTABLE CHEST - 1 VIEW  Comparison: 07/30/2011; 07/28/2011  Findings:  Grossly unchanged enlarged cardiac silhouette and mediastinal contours. Interval retraction of endotracheal tube with tip now overlying tracheal air column, tip superior to the carina. Interval placement of enteric tube with tip terminating inferior to the left hemidiaphragm.  Improved aeration of the left lung base with minimal bibasilar heterogeneous opacities.  No pleural effusion or pneumothorax.  Grossly unchanged bones.  IMPRESSION: 1.  Interval retraction of enteric tube with tip now terminating superior to the carina.  No pneumothorax. 2.  Interval placement of enteric tube with tip inferior to the left hemidiaphragm. 3.  Improved aeration of the left lung base with persistent basilar opacities favored to represent atelectasis.  Original Report Authenticated By: Waynard Reeds, M.D.    Medications:  Scheduled:   . albuterol  2.5 mg Nebulization Q4H   And  . ipratropium  0.5 mg Nebulization Q4H  .  antiseptic oral rinse  15 mL Mouth Rinse QID  . aspirin EC  81 mg Oral Daily  . enoxaparin  40 mg Subcutaneous Q24H  . insulin aspart  0-9 Units Subcutaneous Q4H  . methylPREDNISolone (SOLU-MEDROL) injection  60 mg Intravenous Q12H  . moxifloxacin  400 mg Intravenous Q24H  . nicotine  14 mg Transdermal Daily  . pantoprazole  80 mg Oral BID AC  . PARoxetine  30 mg Oral BH-q7a  . polyethylene glycol  17 g Oral Daily  . sodium chloride      . DISCONTD: chlorhexidine  15 mL Mouth/Throat BID  . DISCONTD: methylPREDNISolone (SOLU-MEDROL) injection  125 mg Intravenous Q6H  . DISCONTD: pantoprazole (PROTONIX) IV  80 mg Intravenous  Q12H   Continuous:   . 0.9 % NaCl with KCl 40 mEq / Wells 20 mL/hr at 07/31/11 1800  . DISCONTD: fentaNYL infusion INTRAVENOUS Stopped (07/31/11 0930)  . DISCONTD: midazolam (VERSED) infusion Stopped (07/31/11 0930)   ZOX:[id number], traZODone, DISCONTD: fentaNYL, DISCONTD: midazolam  Assesment: She was admitted with acute on chronic respiratory failure requiring ventilator support. She has been extubated. She says she feels better. She does not have an explanation for what happens. She says she has not taken too much of any of her medications. She does feel that she has a great deal of stress. Active Problems:  DEPRESSION  Other chronic pain  Acute respiratory failure  Hyperglycemia  COPD (chronic obstructive pulmonary disease) with acute bronchitis  HTN (hypertension)  Tobacco abuse  Anxiety disorder  Hypercapnia  Lethargy    Plan: I discussed her situation with Dr. Lendell Caprice hospitalist attending yesterday and we are going to see about potentially getting her on BiPAP at home and a prolonged steroid taper    LOS: 2 days   Kristin Wells 08/01/2011, 7:34 AM

## 2011-08-01 NOTE — Progress Notes (Signed)
CARE MANAGEMENT NOTE 08/01/2011  Patient:  Kristin Wells, Kristin Wells   Account Number:  [id number]  Date Initiated:  08/01/2011  Documentation initiated by:  Rosemary Holms  Subjective/Objective Assessment:   Pt admitted with shortness of breath. PTA, had been inpt just a week or so ago. Is seen by [facility] (RN) and Marja Kays CM with [facility] also involved with home care     Action/Plan:   Due to so many frequesnt admissions, CM currently researching criteria to go home with BIPAP at night.   Anticipated DC Date:  08/03/2011   Anticipated DC Plan:  HOME W HOME HEALTH SERVICES      DC Planning Services  CM consult      Choice offered to / List presented to:             Status of service:  In process, will continue to follow Medicare Important Message given?   (If response is "NO", the following Medicare IM given date fields will be blank) Date Medicare IM given:   Date Additional Medicare IM given:    Discharge Disposition:    Per UR Regulation:    If discussed at Long Length of Stay Meetings, dates discussed:    Comments:  08/01/11 1425 Justa Hatchell Leanord Hawking RN BSN

## 2011-08-02 LAB — CULTURE, BLOOD (ROUTINE X 2)
Culture: NO GROWTH
Culture: NO GROWTH

## 2011-08-02 LAB — GLUCOSE, CAPILLARY
Glucose-Capillary: 198 mg/dL — ABNORMAL HIGH (ref 70–99)
Glucose-Capillary: 193 mg/dL — ABNORMAL HIGH (ref 70–99)

## 2011-08-02 MED ORDER — PREDNISONE 10 MG PO TABS: ORAL_TABLET | ORAL | Status: AC

## 2011-08-02 MED ORDER — LEVOFLOXACIN 500 MG PO TABS: 500.0000 mg | ORAL_TABLET | Freq: Every day | ORAL | Status: AC

## 2011-08-02 MED ORDER — ALPRAZOLAM 0.5 MG PO TABS: 0.5000 mg | ORAL_TABLET | Freq: Three times a day (TID) | ORAL | Status: DC | PRN

## 2011-08-02 NOTE — Progress Notes (Signed)
Discharge summary sent to payer through [facility] in addition to initial review done on a previous day

## 2011-08-02 NOTE — Progress Notes (Signed)
CARE MANAGEMENT NOTE 08/02/2011  Patient:  Kristin Wells, Kristin Wells   Account Number:  [id number]  Date Initiated:  08/01/2011  Documentation initiated by:  Rosemary Holms  Subjective/Objective Assessment:   Pt admitted with shortness of breath. PTA, had been inpt just a week or so ago. Is seen by [facility] (RN) and Marja Kays CM with [facility] also involved with home care     Action/Plan:   Due to so many frequesnt admissions, CM currently researching criteria to go home with BIPAP at night.   Anticipated DC Date:  08/03/2011   Anticipated DC Plan:  HOME W HOME HEALTH SERVICES      DC Planning Services  CM consult      Choice offered to / List presented to:          [facility] arranged  [facility] RN      [facility] agency  [facility]   Status of service:  Completed, signed off Medicare Important Message given?   (If response is "NO", the following Medicare IM given date fields will be blank) Date Medicare IM given:   Date Additional Medicare IM given:    Discharge Disposition:  HOME W HOME HEALTH SERVICES  Per UR Regulation:    If discussed at Long Length of Stay Meetings, dates discussed:    Comments:  08/02/11 1400 Mavis Gravelle RN BSN CM Pt discharged home before BIPAP arrangement could be completed. [facility] working to assure that pt meets home BIPAP criteria. [facility] notified to resume [facility] RN. Marja Kays with [facility] notified of pt's discharge. Also mentioned to Serina Cowper that the CSW and CM discussed the possibility of Assisted Living.  08/01/11 1425 Ashey Tramontana Leanord Hawking RN BSN

## 2011-08-02 NOTE — Discharge Summary (Signed)
Physician Discharge Summary  Patient ID: Kristin Wells MRN: [id number] DOB/AGE: November 15, 1950 61 y.o.  Admit date: 07/30/2011 Discharge date: 08/02/2011  Discharge Diagnoses:  Active Problems:  Acute respiratory failure  COPD (chronic obstructive pulmonary disease) with acute bronchitis  Hypercapnia  DEPRESSION  Other chronic pain  Hyperglycemia  HTN (hypertension)  Tobacco abuse  Anxiety disorder  Lethargy   Medication List  As of 08/02/2011 10:23 AM   TAKE these medications         albuterol 108 (90 BASE) MCG/ACT inhaler   Commonly known as: PROVENTIL HFA;VENTOLIN HFA   Inhale 1-2 puffs into the lungs every 6 (six) hours as needed for wheezing.      albuterol (2.5 MG/3ML) 0.083% nebulizer solution   Commonly known as: PROVENTIL   Take 3 mLs (2.5 mg total) by nebulization every 6 (six) hours as needed.      ALPRAZolam 0.5 MG tablet   Commonly known as: XANAX   Take 1 tablet (0.5 mg total) by mouth 3 (three) times daily as needed for anxiety.      aspirin EC 81 MG tablet   Take 1 tablet (81 mg total) by mouth daily.      cyclobenzaprine 5 MG tablet   Commonly known as: FLEXERIL   Take 5 mg by mouth 3 (three) times daily as needed.      dexlansoprazole 60 MG capsule   Commonly known as: DEXILANT   Take 1 capsule (60 mg total) by mouth daily.      Fluticasone-Salmeterol 250-50 MCG/DOSE Aepb   Commonly known as: ADVAIR   Inhale 1 puff into the lungs every 12 (twelve) hours.      gabapentin 300 MG capsule   Commonly known as: NEURONTIN   Take 300 mg by mouth 2 (two) times daily.      guaiFENesin 600 MG 12 hr tablet   Commonly known as: MUCINEX   Take 2 tablets (1,200 mg total) by mouth 2 (two) times daily.      levofloxacin 500 MG tablet   Commonly known as: LEVAQUIN   Take 1 tablet (500 mg total) by mouth daily.      nicotine 14 mg/24hr patch   Commonly known as: NICODERM CQ - dosed in mg/24 hours   Place 1 patch onto the skin daily.      oxybutynin 5 MG 24  hr tablet   Commonly known as: DITROPAN-XL   Take 5 mg by mouth daily.      oxyCODONE-acetaminophen 5-325 MG per tablet   Commonly known as: PERCOCET   Take 0.5 tablets by mouth every 4 (four) hours as needed. Back spurs      PARoxetine 30 MG tablet   Commonly known as: PAXIL   Take 1 tablet (30 mg total) by mouth every morning.      polyethylene glycol powder powder   Commonly known as: GLYCOLAX/MIRALAX   Take 17 g by mouth daily.      predniSONE 10 MG tablet   Commonly known as: DELTASONE   4 tablets daily for 4 days, then decrease by 1 tablet every 4 days until off      roflumilast 500 MCG Tabs tablet   Commonly known as: DALIRESP   Take 1 tablet (500 mcg total) by mouth daily.      rosuvastatin 10 MG tablet   Commonly known as: CRESTOR   Take 10 mg by mouth daily.      tiotropium 18 MCG inhalation capsule   Commonly known  as: SPIRIVA   Place 1 capsule (18 mcg total) into inhaler and inhale daily.      traZODone 100 MG tablet   Commonly known as: DESYREL   Take 50 mg by mouth at bedtime.      zolpidem 10 MG tablet   Commonly known as: AMBIEN   Take 10 mg by mouth at bedtime.            Discharge Orders    Future Appointments: Provider: Department: Dept Phone: Center:   08/18/2011 11:00 AM Jodelle Gross, NP [facility] [id number]     Future Orders Please Complete By Expires   Increase activity slowly         Follow-up Information    Follow up with AVBUERE,EDWIN A, MD in 1 week.         Disposition: 01-Home or Self Care  Discharged Condition: stable  Consults: Treatment Team:  Fredirick Maudlin, MD  Labs:    7.309 7.348 7.406     pCO2 arterial        54.9 59.8  41.8     pO2, Arterial        400.0 66.3 144.0     Bicarbonate        26.8 32.0 25.8     TCO2         29.6 23.0     Acid-Base Excess        1.2 6.6 1.5     O2 Saturation        99.7 92.9 98.9     Patient temperature        37.0 37.0 37.0     Collection site         RADIAL LEFT RADIAL LEFT RADIAL     Allens test (pass/fail)        PASS PASS PASS      CHEM PROFILE    Sodium       135 142  143 138     Potassium       3.7 4.2 4.0 3.8     Chloride       98 103 109 102     CO2       30 31 26 29      BUN       8 5 7 13      Creatinine, Ser       0.79 0.94 0.78 0.74     Calcium       8.7 9.2 9.3 9.3     GFR calc non Af Amer       89 65 90 90 mL/min">90     Glucose, Bld       107 158 177 189     Alkaline Phosphatase       75        Albumin       2.8        AST       27        ALT       26        Total Protein       6.1        Total Bilirubin       0.3         CARDIAC PROFILE    CK, MB        1.5       Total CK        30  Troponin I       <0.30  <0.30        Pro B Natriuretic peptide (BNP)        259.3 524.8       PROTEIN ELP    Total Protein       6.1         CBC    WBC       9.5 9.0 11.5      RBC       4.52 4.52 4.10      Hemoglobin       13.1 13.1 12.0      HCT       39.8 40.8 36.5      MCV       88.1 90.3 89.0      MCH       29.0 29.0 29.3      MCHC       32.9 32.1 32.9      RDW       15.1 15.2 15.5      Platelets       218 208 204       DIFFERENTIAL    Neutrophils Relative       65 53       Lymphocytes Relative       29 37       Monocytes Relative       4 8       Eosinophils Relative       2 3       Basophils Relative       0 0       Neutro Abs       6.2 4.8       Lymphs Abs       2.7 3.3       Monocytes Absolute       0.4 0.7       Eosinophils Absolute       0.1 0.2       Basophils Absolute       0.0 0.0        DIABETES    Glucose-Capillary         172 186 186 198   Glucose, Bld       107 158 177 189      URINALYSIS    Color, Urine       YELLOW YELLOW       APPearance       CLEAR CLEAR       Specific Gravity, Urine       1.010 1.025       pH       6.5 6.0       Glucose, UA       NEGATIVE 100       Bilirubin Urine       NEGATIVE NEGATIVE       Ketones, ur       NEGATIVE NEGATIVE       Protein, ur        NEGATIVE 30       Urobilinogen, UA       0.2 0.2       Nitrite       NEGATIVE NEGATIVE       Leukocytes, UA       NEGATIVE NEGATIVE       Hgb urine dipstick       TRACE MODERATE       WBC, UA  3-6 0-2       RBC / HPF       3-6 3-6       Squamous Epithelial / LPF       MANY RARE       Bacteria, UA       FEW RARE        TOX, URINE    Amphetamines        NONE DETECTED       Barbiturates        NONE DETECTED        Benzodiazepines        POSITIVE       Opiates        NONE DETECTED       Cocaine        NONE DETECTED       Tetrahydrocannabinol        NONE DETECTED         Diagnostics:  Nm Myocar Single W/spect W/wall Motion And Ef  07/04/2011  This examination was dictated by the cardiologist who supervised the examination.  Please see that report for details.  nm myoview pharmacologic stress  Ordering Physician: Joni Reining  Reading Physician: Shiner Bing  Clinical Data: 61 year old woman admitted to hospital with respiratory failure and pneumonia; cardiac markers suggest modest myocardial necrosis.  NUCLEAR MEDICINE ADENOSINE STRESS MYOVIEW STUDY WITH SPECT AND LEFT VENTRIUCLAR EJECTION FRACTION  Radionuclide Data: One-day rest/stress protocol performed with 10/30 mCi of Tc-2m Myoview.  Stress Data: Regadenoson infusion resulted in dizziness and malaise.  There was no change in blood pressure from an initial value of 88/50, but heart rate increased moderately following drug administration.  No arrhythmias noted.  EKG: Normal sinus rhythm; low voltage in the chest leads; borderline QT prolongation; T-wave abnormality consistent with inferolateral ischemia.  No significant change following Regadenoson infusion.  Scintigraphic Data: Acquisition notable for mild breast and diaphragmatic attenuation.  There was mild subdiaphragmatic activity adjacent to the inferior myocardium.  Left ventricular size was normal.  On tomographic images reconstructed in standard planes, there was a  minimal anteroapical defect with an intensity that was of borderline numeric significance by quantitative analysis.  By comparison to the resting portion of the study, no reversibility was apparent.  The gated reconstruction demonstrated normal to hyperdynamic regional and global LV systolic function as well as normal systolic accentuation of activity throughout. Estimated ejection fraction exceeded 65%.  IMPRESSION: Low risk pharmacologic stress nuclear myocardial study revealing no significant stress-induced EKG abnormalities, normal left ventricular size and normal left ventricular systolic function.  A minor perfusion defect was present as described above, which may represent superimposed breast attenuation and physiologic apical thinning.  No convincing evidence for ischemia or infarction.  Original Report Authenticated By: Meliton Rattan Chest Port 1 View  07/31/2011  *RADIOLOGY REPORT*  Clinical Data: Intubated  PORTABLE CHEST - 1 VIEW  Comparison: 07/30/2011; 07/28/2011  Findings:  Grossly unchanged enlarged cardiac silhouette and mediastinal contours. Interval retraction of endotracheal tube with tip now overlying tracheal air column, tip superior to the carina. Interval placement of enteric tube with tip terminating inferior to the left hemidiaphragm.  Improved aeration of the left lung base with minimal bibasilar heterogeneous opacities.  No pleural effusion or pneumothorax.  Grossly unchanged bones.  IMPRESSION: 1.  Interval retraction of enteric tube with tip now terminating superior to the carina.  No pneumothorax. 2.  Interval placement of enteric tube with tip inferior to the left hemidiaphragm. 3.  Improved aeration of the left lung base with persistent basilar opacities favored to represent atelectasis.  Original Report Authenticated By: Waynard Reeds, M.D.   Dg Chest Port 1 View  07/30/2011  *RADIOLOGY REPORT*  Clinical Data: Intubation, COPD, asthma, history coronary disease post MI,  ischemic cardiomyopathy  PORTABLE CHEST - 1 VIEW  Comparison: Portable exam 0403 hours compared to 07/30/2011 at 0158 hours  Findings: Endotracheal tube in proximal right main stem bronchus; recommend withdrawal 3.5 cm for positioning at a satisfactory level above the carina. Hyperinflation of the right lung with mild volume loss in left lung. Cardiac silhouette appears minimally enlarged. Atherosclerotic calcification aorta. Underlying emphysematous changes. Minimal peribronchial thickening noted. No definite infiltrate.  IMPRESSION: Right mainstem bronchus intubation; recommend withdrawal of endotracheal tube 3.5 cm. Emphysematous and bronchitic changes with hyperinflation of the right lung and mild diffuse left lung atelectasis.  Critical Value/emergent results were called by telephone at the time of interpretation on 07/30/2011  at 0416 hours  to  Darl Pikes RN in ED, who verbally acknowledged these results.  Original Report Authenticated By: Lollie Marrow, M.D.   Dg Chest Port 1 View  07/30/2011  *RADIOLOGY REPORT*  Clinical Data: Dyspnea, back pain, leg pain, COPD, asthma, coronary disease post MI  PORTABLE CHEST - 1 VIEW  Comparison: Portable exam 0200 hours compared to 07/28/2011  Findings: Enlargement of cardiac silhouette with pulmonary vascular congestion. Atherosclerotic calcification of mildly tortuous thoracic aorta. Minimal right base atelectasis. Lungs otherwise clear. No pleural effusion or pneumothorax. Bones demineralized.  IMPRESSION: Enlargement of cardiac silhouette with pulmonary vascular congestion. Minimal right base atelectasis.  Original Report Authenticated By: Lollie Marrow, M.D.   Dg Chest Port 1 View  07/28/2011  *RADIOLOGY REPORT*  Clinical Data: Dizziness and nausea.  Recent cardiac arrest.  PORTABLE CHEST - 1 VIEW  Comparison: 07/11/2011.  Findings: Trachea is midline.  Heart size normal.  Lungs are clear. No pleural fluid.  IMPRESSION: No acute findings.  Original Report  Authenticated By: Reyes Ivan, M.D.   Dg Chest Portable 1 View  07/11/2011  *RADIOLOGY REPORT*  Clinical Data: Shortness of breath.  PORTABLE CHEST - 1 VIEW  Comparison: 06/29/2011  Findings: Heart size and vascularity are normal and the lungs are clear of acute infiltrates and effusions.  No effusions.  No osseous abnormality.  IMPRESSION: No acute abnormalities.  Original Report Authenticated By: Gwynn Burly, M.D.   Procedures: Intubation mechanical ventilation  EKG: Sinus tachycardia with nonspecific changes  CODE STATUS full code  Hospital Course: See H&P for complete admission details. Ms. Auxier is a 61 year old white female with COPD and recurrent hospitalizations for recurrent respiratory failure. She denies smoking cigarettes for 2 months but there reports that she may be continuing to smoke. She does report continued secondhand smoke. She came to the emergency room short of breath. She was noted to be wheezing and in moderate to severe respiratory distress. She reportedly became lethargic in the emergency room, she was subsequently intubated.  She was admitted to the hospitalist service. She was started on steroids, bronchodilators, antibiotics. Dr. Juanetta Gosling was consulted for vent management. She was able to be extubated quickly and felt well. After extubation she did report that she had a "head cold" she also reported sensitivity to household cleaning products and that she's been exposed to secondhand smoke. Chest x-ray showed no infiltrate. On the day of discharge she has clear lung sounds. Is able to ambulate, has normal vital signs and is stable for  discharge. She was just discharged from the hospital recently and had respiratory failure at that time, requiring BiPAP. About a month ago, she also was hospitalized and required intubation as well. I've asked care management to look into home BiPAP do to patient's recurrent respiratory failure requiring frequent admissions and  mechanical ventilation, both invasive and noninvasive. She has no history of obstructive sleep apnea. The BiPAP would be for the COPD and recurrent severe respiratory failure. She also will be on a prolonged prednisone taper and I recommend close followup with her primary care provider to oversee her prednisone taper, recurrent hospitalizations for respiratory failure. Total time on the day of discharge is greater than 30 minutes.  Discharge Exam:  Blood pressure 113/75, pulse 91, temperature 98 F (36.7 C), temperature source Oral, resp. rate 20, height 5\' 4"  (1.626 m), weight 65.3 kg (143 lb 15.4 oz), SpO2 93.00%.  Unchanged from 08/01/2011   Signed: Crista Curb L 08/02/2011, 10:23 AM

## 2011-08-17 ENCOUNTER — Encounter: Payer: Self-pay | Admitting: Adult Health

## 2011-08-18 ENCOUNTER — Ambulatory Visit (INDEPENDENT_AMBULATORY_CARE_PROVIDER_SITE_OTHER): Payer: PRIVATE HEALTH INSURANCE | Admitting: Adult Health

## 2011-08-18 ENCOUNTER — Encounter: Payer: Self-pay | Admitting: Adult Health

## 2011-08-18 VITALS — BP 102/78 | HR 93 | Resp 18 | Ht 61.0 in | Wt 146.0 lb

## 2011-08-18 DIAGNOSIS — I219 Acute myocardial infarction, unspecified: Secondary | ICD-10-CM

## 2011-08-18 DIAGNOSIS — F17200 Nicotine dependence, unspecified, uncomplicated: Secondary | ICD-10-CM

## 2011-08-18 DIAGNOSIS — I10 Essential (primary) hypertension: Secondary | ICD-10-CM

## 2011-08-18 DIAGNOSIS — I214 Non-ST elevation (NSTEMI) myocardial infarction: Secondary | ICD-10-CM

## 2011-08-18 NOTE — Assessment & Plan Note (Signed)
Blood pressure is well controlled at present. She is asymptomatic for dizziness or pre-syncope.  No changes.

## 2011-08-18 NOTE — Progress Notes (Signed)
HPI: [name] is a 61 y/o patient we are seeing post hospitalization with history of VDRF, COPD, hypertension, ongoing tobacco abuse, and anxiety. She was given a stress myoview after recovering from her COPD exacerbation demonstrating " low risk with no significant stress induced EKG abnormalities. Normal LV systolic function." Echo, however, demonstrated Mild to moderate LVH with disproportionate septal hypertrophy.Systolic function was severely reduced. The estimated ejection fraction was in the range of 25% to 30%. Moderate to severe hypokinesis of the distal anteroseptal myocardium. Apical akinesis to mild dyskinesis. Akinesis of the mid-distalinferior myocardium.She is without complaint today. Is working on smoking cessation, and has cut down but not completely eliminated tobacco. She denies symptoms.     No Known Allergies  Current Outpatient Prescriptions  Medication Sig Dispense Refill  . albuterol (PROVENTIL HFA;VENTOLIN HFA) 108 (90 BASE) MCG/ACT inhaler Inhale 1-2 puffs into the lungs every 6 (six) hours as needed for wheezing.  1 Inhaler  0  . albuterol (PROVENTIL) (2.5 MG/3ML) 0.083% nebulizer solution Take 3 mLs (2.5 mg total) by nebulization every 6 (six) hours as needed.  75 mL  1  . ALPRAZolam (XANAX) 0.5 MG tablet Take 1 tablet (0.5 mg total) by mouth 3 (three) times daily as needed for anxiety.  15 tablet  0  . aspirin EC 81 MG tablet Take 1 tablet (81 mg total) by mouth daily.  30 tablet  1  . CALCIUM PO Take by mouth daily.      . cyclobenzaprine (FLEXERIL) 5 MG tablet Take 5 mg by mouth 2 (two) times daily as needed.       Marland Kitchen dexlansoprazole (DEXILANT) 60 MG capsule Take 1 capsule (60 mg total) by mouth daily.  30 capsule  1  . Fluticasone-Salmeterol (ADVAIR) 250-50 MCG/DOSE AEPB Inhale 1 puff into the lungs every 12 (twelve) hours.  60 each  1  . gabapentin (NEURONTIN) 300 MG capsule Take 300 mg by mouth 2 (two) times daily.      Marland Kitchen guaiFENesin (MUCINEX) 600 MG 12 hr  tablet Take 2 tablets (1,200 mg total) by mouth 2 (two) times daily.  14 tablet  0  . omeprazole (PRILOSEC) 20 MG capsule Take 20 mg by mouth 2 (two) times daily.      Marland Kitchen oxybutynin (DITROPAN-XL) 5 MG 24 hr tablet Take 5 mg by mouth daily.      Marland Kitchen oxyCODONE-acetaminophen (PERCOCET) 5-325 MG per tablet Take 0.5 tablets by mouth every 4 (four) hours as needed. Back spurs  30 tablet  0  . PARoxetine (PAXIL) 30 MG tablet Take 1 tablet (30 mg total) by mouth every morning.  30 tablet  1  . polyethylene glycol powder (GLYCOLAX/MIRALAX) powder Take 17 g by mouth daily.  255 g  1  . roflumilast (DALIRESP) 500 MCG TABS tablet Take 1 tablet (500 mcg total) by mouth daily.  30 tablet  1  . rosuvastatin (CRESTOR) 10 MG tablet Take 10 mg by mouth daily.      Marland Kitchen tiotropium (SPIRIVA) 18 MCG inhalation capsule Place 1 capsule (18 mcg total) into inhaler and inhale daily.  30 capsule  1  . traZODone (DESYREL) 100 MG tablet Take 50 mg by mouth at bedtime.      Marland Kitchen zolpidem (AMBIEN) 10 MG tablet Take 10 mg by mouth at bedtime.        Past Medical History  Diagnosis Date  . COPD (chronic obstructive pulmonary disease)   . Asthma   . Bronchitis   . Anxiety   .  Back pain   . Demand ischemia of myocardium 06/28/2011  . Thyroid enlargement 06/28/2011  . MI, acute, non ST segment elevation 06/29/2011    Myoview stress test revealed mild perfusion defect  . Ischemic cardiomyopathy 06/28/2011    EF 25%. Myoview stress test later showed ejection fraction of 65%  . PNA (pneumonia) 06/29/2011  . Acute respiratory failure 06/2011    Vent dependent sec to COPD/PNA  . Hypertension   . Coronary artery disease   . Myocardial infarction   . Pneumonia     Past Surgical History  Procedure Date  . Abdominal hysterectomy     ZOX:WRUEAV of systems complete and found to be negative unless listed above PHYSICAL EXAM BP 102/78  Pulse 93  Resp 18  Ht 5\' 1"  (1.549 m)  Wt 146 lb (66.225 kg)  BMI 27.59 kg/m2  General: Well  developed, well nourished, in no acute distress Head: Eyes PERRLA, No xanthomas.   Normal cephalic and atramatic Lungs: Clear bilaterally to auscultation and percussion.No wheezes are noted. Heart: HRRR S1 S2, without MRG.  Pulses are 2+ & equal.            No carotid bruit. No JVD.  No abdominal bruits. No femoral bruits. Abdomen: Bowel sounds are positive, abdomen soft and non-tender without masses or                  Hernia's noted. Msk:  Back normal, normal gait. Normal strength and tone for age. Extremities: No clubbing, cyanosis or edema.  DP +1 Neuro: Alert and oriented X 3. Psych:  Good affect, responds appropriately    ASSESSMENT AND PLAN

## 2011-08-18 NOTE — Patient Instructions (Signed)
**Note De-Identified Eutimio Gharibian Obfuscation** Your physician recommends that you continue on your current medications as directed. Please refer to the Current Medication list given to you today.  Your physician discussed the hazards of tobacco use. Tobacco use cessation is recommended and techniques and options to help you quit were discussed.  Your physician recommends that you schedule a follow-up appointment in: 1 year

## 2011-08-18 NOTE — Assessment & Plan Note (Signed)
This was found to be related to demand ischemia in the setting of respiratory failure. She had a normal stress test during admission. Echo was abnormal EF of 25%-30& during acute phase of admission. Will continue her on current medications. No BB in the setting of COPD. Will repeat echo in 6 months for ongoing evaluation of EF.

## 2011-08-18 NOTE — Assessment & Plan Note (Signed)
I have spoken with her about immediate cessation of smoking. She is actively trying to quit. I have advised her that her life-expectancy is shorted significantly if she continues to smoke with history of COPD. She verbalizes understanding.

## 2012-02-20 ENCOUNTER — Encounter (HOSPITAL_COMMUNITY): Payer: Self-pay | Admitting: Emergency Medicine

## 2012-02-20 ENCOUNTER — Inpatient Hospital Stay (HOSPITAL_COMMUNITY)
Admission: EM | Admit: 2012-02-20 | Discharge: 2012-02-24 | DRG: 189 | Disposition: A | Discharge status: Home Health | Payer: PRIVATE HEALTH INSURANCE | Attending: Internal Medicine | Admitting: Internal Medicine

## 2012-02-20 ENCOUNTER — Emergency Department (HOSPITAL_COMMUNITY): Payer: PRIVATE HEALTH INSURANCE

## 2012-02-20 DIAGNOSIS — I2510 Atherosclerotic heart disease of native coronary artery without angina pectoris: Secondary | ICD-10-CM | POA: Diagnosis present

## 2012-02-20 DIAGNOSIS — R5383 Other fatigue: Secondary | ICD-10-CM

## 2012-02-20 DIAGNOSIS — I10 Essential (primary) hypertension: Secondary | ICD-10-CM

## 2012-02-20 DIAGNOSIS — R0689 Other abnormalities of breathing: Secondary | ICD-10-CM

## 2012-02-20 DIAGNOSIS — E876 Hypokalemia: Secondary | ICD-10-CM

## 2012-02-20 DIAGNOSIS — J209 Acute bronchitis, unspecified: Secondary | ICD-10-CM

## 2012-02-20 DIAGNOSIS — J9600 Acute respiratory failure, unspecified whether with hypoxia or hypercapnia: Secondary | ICD-10-CM

## 2012-02-20 DIAGNOSIS — I5022 Chronic systolic (congestive) heart failure: Secondary | ICD-10-CM | POA: Diagnosis present

## 2012-02-20 DIAGNOSIS — J440 Chronic obstructive pulmonary disease with acute lower respiratory infection: Secondary | ICD-10-CM

## 2012-02-20 DIAGNOSIS — I214 Non-ST elevation (NSTEMI) myocardial infarction: Secondary | ICD-10-CM

## 2012-02-20 DIAGNOSIS — J449 Chronic obstructive pulmonary disease, unspecified: Secondary | ICD-10-CM

## 2012-02-20 DIAGNOSIS — Z91199 Patient's noncompliance with other medical treatment and regimen due to unspecified reason: Secondary | ICD-10-CM

## 2012-02-20 DIAGNOSIS — Z9981 Dependence on supplemental oxygen: Secondary | ICD-10-CM

## 2012-02-20 DIAGNOSIS — F419 Anxiety disorder, unspecified: Secondary | ICD-10-CM

## 2012-02-20 DIAGNOSIS — M549 Dorsalgia, unspecified: Secondary | ICD-10-CM

## 2012-02-20 DIAGNOSIS — I255 Ischemic cardiomyopathy: Secondary | ICD-10-CM

## 2012-02-20 DIAGNOSIS — J9620 Acute and chronic respiratory failure, unspecified whether with hypoxia or hypercapnia: Secondary | ICD-10-CM

## 2012-02-20 DIAGNOSIS — I509 Heart failure, unspecified: Secondary | ICD-10-CM | POA: Diagnosis present

## 2012-02-20 DIAGNOSIS — R739 Hyperglycemia, unspecified: Secondary | ICD-10-CM

## 2012-02-20 DIAGNOSIS — F411 Generalized anxiety disorder: Secondary | ICD-10-CM | POA: Diagnosis present

## 2012-02-20 DIAGNOSIS — F3289 Other specified depressive episodes: Secondary | ICD-10-CM | POA: Diagnosis present

## 2012-02-20 DIAGNOSIS — F32A Depression, unspecified: Secondary | ICD-10-CM | POA: Diagnosis present

## 2012-02-20 DIAGNOSIS — Z9119 Patient's noncompliance with other medical treatment and regimen: Secondary | ICD-10-CM

## 2012-02-20 DIAGNOSIS — I252 Old myocardial infarction: Secondary | ICD-10-CM

## 2012-02-20 DIAGNOSIS — F17200 Nicotine dependence, unspecified, uncomplicated: Secondary | ICD-10-CM | POA: Diagnosis present

## 2012-02-20 DIAGNOSIS — G8929 Other chronic pain: Secondary | ICD-10-CM | POA: Diagnosis present

## 2012-02-20 DIAGNOSIS — J441 Chronic obstructive pulmonary disease with (acute) exacerbation: Secondary | ICD-10-CM | POA: Diagnosis present

## 2012-02-20 DIAGNOSIS — E049 Nontoxic goiter, unspecified: Secondary | ICD-10-CM

## 2012-02-20 DIAGNOSIS — F329 Major depressive disorder, single episode, unspecified: Secondary | ICD-10-CM | POA: Diagnosis present

## 2012-02-20 DIAGNOSIS — Z720 Tobacco use: Secondary | ICD-10-CM

## 2012-02-20 LAB — COMPREHENSIVE METABOLIC PANEL
ALT: 11 U/L (ref 0–35)
AST: 19 U/L (ref 0–37)
Albumin: 3.6 g/dL (ref 3.5–5.2)
Alkaline Phosphatase: 123 U/L — ABNORMAL HIGH (ref 39–117)
BUN: 8 mg/dL (ref 6–23)
CO2: 25 mEq/L (ref 19–32)
Calcium: 9.2 mg/dL (ref 8.4–10.5)
Chloride: 98 mEq/L (ref 96–112)
Creatinine, Ser: 0.74 mg/dL (ref 0.50–1.10)
GFR calc Af Amer: >90 mL/min (ref >90)
GFR calc non Af Amer: >90 mL/min (ref >90)
Glucose, Bld: 148 mg/dL — ABNORMAL HIGH (ref 70–99)
Potassium: 4.4 mEq/L (ref 3.5–5.1)
Sodium: 138 mEq/L (ref 135–145)
Total Bilirubin: 0.2 mg/dL — ABNORMAL LOW (ref 0.3–1.2)
Total Protein: 7.4 g/dL (ref 6.0–8.3)

## 2012-02-20 LAB — BLOOD GAS, ARTERIAL
Acid-Base Excess: 2.6 mmol/L — ABNORMAL HIGH (ref 0.0–2.0)
Acid-Base Excess: 3.4 mmol/L — ABNORMAL HIGH (ref 0.0–2.0)
Bicarbonate: 31.6 mEq/L — ABNORMAL HIGH (ref 20.0–24.0)
Bicarbonate: 30.9 mEq/L — ABNORMAL HIGH (ref 20.0–24.0)
Delivery systems: POSITIVE
Delivery systems: POSITIVE
Drawn by: 310571
Drawn by: 270271
Expiratory PAP: 5
Expiratory PAP: 6
FIO2: 0.4 %
FIO2: 0.4 %
Inspiratory PAP: 10
Inspiratory PAP: 18
Mode: POSITIVE
O2 Saturation: 95.7 %
O2 Saturation: 94.8 %
Patient temperature: 98.6
Patient temperature: 98.6
TCO2: 28.7 mmol/L (ref 0–100)
TCO2: 27.7 mmol/L (ref 0–100)
pCO2 arterial: 72 mmHg (ref 35.0–45.0)
pCO2 arterial: 62 mmHg (ref 35.0–45.0)
pH, Arterial: 7.265 — ABNORMAL LOW (ref 7.350–7.450)
pH, Arterial: 7.318 — ABNORMAL LOW (ref 7.350–7.450)
pO2, Arterial: 84.9 mmHg (ref 80.0–100.0)
pO2, Arterial: 73.9 mmHg — ABNORMAL LOW (ref 80.0–100.0)

## 2012-02-20 LAB — D-DIMER, QUANTITATIVE: D-Dimer, Quant: 0.3 ug/mL-FEU (ref 0.00–0.48)

## 2012-02-20 LAB — CBC WITH DIFFERENTIAL/PLATELET
Basophils Absolute: 0 10*3/uL (ref 0.0–0.1)
Basophils Relative: 0 % (ref 0–1)
Eosinophils Absolute: 0.8 10*3/uL — ABNORMAL HIGH (ref 0.0–0.7)
Eosinophils Relative: 6 % — ABNORMAL HIGH (ref 0–5)
HCT: 47.7 % — ABNORMAL HIGH (ref 36.0–46.0)
Hemoglobin: 15.4 g/dL — ABNORMAL HIGH (ref 12.0–15.0)
Lymphocytes Relative: 11 % — ABNORMAL LOW (ref 12–46)
Lymphs Abs: 1.7 10*3/uL (ref 0.7–4.0)
MCH: 29 pg (ref 26.0–34.0)
MCHC: 32.3 g/dL (ref 30.0–36.0)
MCV: 89.8 fL (ref 78.0–100.0)
Monocytes Absolute: 0.5 10*3/uL (ref 0.1–1.0)
Monocytes Relative: 4 % (ref 3–12)
Neutro Abs: 12 10*3/uL — ABNORMAL HIGH (ref 1.7–7.7)
Neutrophils Relative %: 80 % — ABNORMAL HIGH (ref 43–77)
Platelets: 249 10*3/uL (ref 150–400)
RBC: 5.31 MIL/uL — ABNORMAL HIGH (ref 3.87–5.11)
RDW: 14.8 % (ref 11.5–15.5)
WBC: 15 10*3/uL — ABNORMAL HIGH (ref 4.0–10.5)

## 2012-02-20 LAB — TROPONIN I: Troponin I: <0.3 ng/mL (ref <0.30)

## 2012-02-20 LAB — GLUCOSE, CAPILLARY: Glucose-Capillary: 151 mg/dL — ABNORMAL HIGH (ref 70–99)

## 2012-02-20 MED ORDER — IPRATROPIUM BROMIDE 0.02 % IN SOLN
0.5000 mg | RESPIRATORY_TRACT | Status: DC
  Administered 2012-02-20 – 2012-02-21 (×4): 0.5 mg via RESPIRATORY_TRACT
  Filled 2012-02-20 (×5): qty 2.5

## 2012-02-20 MED ORDER — METHYLPREDNISOLONE SODIUM SUCC 125 MG IJ SOLR
60.0000 mg | Freq: Once | INTRAMUSCULAR | Status: AC
  Administered 2012-02-20: 60 mg via INTRAVENOUS
  Filled 2012-02-20: qty 2

## 2012-02-20 MED ORDER — ONDANSETRON HCL 4 MG/2ML IJ SOLN: 4.0000 mg | Freq: Three times a day (TID) | INTRAMUSCULAR | Status: DC | PRN

## 2012-02-20 MED ORDER — IPRATROPIUM BROMIDE 0.02 % IN SOLN
0.5000 mg | RESPIRATORY_TRACT | Status: DC | PRN
  Administered 2012-02-22: 0.5 mg via RESPIRATORY_TRACT
  Filled 2012-02-20 (×4): qty 2.5

## 2012-02-20 MED ORDER — ONDANSETRON HCL 4 MG PO TABS: 4.0000 mg | ORAL_TABLET | Freq: Four times a day (QID) | ORAL | Status: DC | PRN

## 2012-02-20 MED ORDER — SODIUM CHLORIDE 0.9 % IV SOLN
INTRAVENOUS | Status: DC
  Administered 2012-02-20: 23:00:00 via INTRAVENOUS

## 2012-02-20 MED ORDER — ONDANSETRON HCL 4 MG/2ML IJ SOLN: 4.0000 mg | Freq: Four times a day (QID) | INTRAMUSCULAR | Status: DC | PRN

## 2012-02-20 MED ORDER — MORPHINE SULFATE 2 MG/ML IJ SOLN: 2.0000 mg | INTRAMUSCULAR | Status: DC | PRN

## 2012-02-20 MED ORDER — SODIUM CHLORIDE 0.9 % IV BOLUS (SEPSIS)
500.0000 mL | Freq: Once | INTRAVENOUS | Status: AC
  Administered 2012-02-20: 500 mL via INTRAVENOUS

## 2012-02-20 MED ORDER — SODIUM CHLORIDE 0.9 % IV SOLN: 250.0000 mL | INTRAVENOUS | Status: DC | PRN

## 2012-02-20 MED ORDER — ALBUTEROL SULFATE (5 MG/ML) 0.5% IN NEBU: 2.5000 mg | INHALATION_SOLUTION | RESPIRATORY_TRACT | Status: DC | PRN

## 2012-02-20 MED ORDER — SODIUM CHLORIDE 0.9 % IV SOLN
INTRAVENOUS | Status: AC
  Administered 2012-02-20: 21:00:00 via INTRAVENOUS

## 2012-02-20 MED ORDER — ACETAMINOPHEN 325 MG PO TABS
650.0000 mg | ORAL_TABLET | Freq: Four times a day (QID) | ORAL | Status: DC | PRN
  Administered 2012-02-22: 650 mg via ORAL
  Filled 2012-02-20: qty 2

## 2012-02-20 MED ORDER — SODIUM CHLORIDE 0.9 % IJ SOLN
3.0000 mL | Freq: Two times a day (BID) | INTRAMUSCULAR | Status: DC
  Administered 2012-02-21 – 2012-02-24 (×7): 3 mL via INTRAVENOUS

## 2012-02-20 MED ORDER — SODIUM CHLORIDE 0.9 % IV BOLUS (SEPSIS): 500.0000 mL | Freq: Once | INTRAVENOUS | Status: DC

## 2012-02-20 MED ORDER — PANTOPRAZOLE SODIUM 40 MG IV SOLR
40.0000 mg | INTRAVENOUS | Status: DC
  Administered 2012-02-20: 40 mg via INTRAVENOUS
  Filled 2012-02-20 (×2): qty 40

## 2012-02-20 MED ORDER — ALBUTEROL SULFATE (5 MG/ML) 0.5% IN NEBU
INHALATION_SOLUTION | RESPIRATORY_TRACT | Status: AC
  Filled 2012-02-20: qty 2

## 2012-02-20 MED ORDER — ALBUTEROL SULFATE (5 MG/ML) 0.5% IN NEBU
INHALATION_SOLUTION | RESPIRATORY_TRACT | Status: AC
  Filled 2012-02-20: qty 5

## 2012-02-20 MED ORDER — LEVOFLOXACIN IN D5W 500 MG/100ML IV SOLN
500.0000 mg | INTRAVENOUS | Status: DC
  Administered 2012-02-21: 500 mg via INTRAVENOUS
  Filled 2012-02-20: qty 100

## 2012-02-20 MED ORDER — ALBUTEROL (5 MG/ML) CONTINUOUS INHALATION SOLN
10.0000 mg/h | INHALATION_SOLUTION | RESPIRATORY_TRACT | Status: DC
  Administered 2012-02-20: 10 mg/h via RESPIRATORY_TRACT

## 2012-02-20 MED ORDER — IPRATROPIUM BROMIDE 0.02 % IN SOLN
RESPIRATORY_TRACT | Status: AC
  Filled 2012-02-20: qty 2.5

## 2012-02-20 MED ORDER — LEVOFLOXACIN IN D5W 750 MG/150ML IV SOLN
750.0000 mg | INTRAVENOUS | Status: DC
  Administered 2012-02-20: 750 mg via INTRAVENOUS
  Filled 2012-02-20: qty 150

## 2012-02-20 MED ORDER — ALBUTEROL SULFATE (5 MG/ML) 0.5% IN NEBU
2.5000 mg | INHALATION_SOLUTION | RESPIRATORY_TRACT | Status: DC
  Administered 2012-02-20 – 2012-02-21 (×4): 2.5 mg via RESPIRATORY_TRACT
  Filled 2012-02-20 (×4): qty 0.5

## 2012-02-20 MED ORDER — SODIUM CHLORIDE 0.9 % IJ SOLN
3.0000 mL | INTRAMUSCULAR | Status: DC | PRN
  Administered 2012-02-22: 3 mL via INTRAVENOUS

## 2012-02-20 MED ORDER — ACETAMINOPHEN 650 MG RE SUPP: 650.0000 mg | Freq: Four times a day (QID) | RECTAL | Status: DC | PRN

## 2012-02-20 MED ORDER — METHYLPREDNISOLONE SODIUM SUCC 125 MG IJ SOLR
60.0000 mg | Freq: Four times a day (QID) | INTRAMUSCULAR | Status: DC
  Administered 2012-02-20 – 2012-02-21 (×2): 60 mg via INTRAVENOUS
  Filled 2012-02-20 (×6): qty 0.96

## 2012-02-20 MED ORDER — HEPARIN SODIUM (PORCINE) 5000 UNIT/ML IJ SOLN
5000.0000 [IU] | Freq: Three times a day (TID) | INTRAMUSCULAR | Status: DC
  Administered 2012-02-20 – 2012-02-24 (×12): 5000 [IU] via SUBCUTANEOUS
  Filled 2012-02-20 (×14): qty 1

## 2012-02-20 NOTE — H&P (Signed)
PCP:   Dorrene German, MD   Chief Complaint:  Acute shortness of breath, cough and wheeze  HPI: This is a 61 year old female, with history of anxiety, chronic back pain, goiter, HTN, CAD, s/p previous NSTEMI, EF 25% to 30% per [facility] cardiology office 2 D echocardiogram of 08/2011, asthma, COPD/chronic respiratory failure on home Oxygen, and recurrent hospitalizations for acute on chronic respiratory failure. She was hospitalized 07/30/11-08/02/11 at [facility] and required brief intubation/Mechanical ventilation at that time. She continues to smoke. She now presents with wheezing and non-productive cough for 2 days, associated with production of whitish phlegm. According to patient she has been non-compliant with her home Albuterol, for the preceding 4 days and today, symptoms failed to alleviate, despite her attempts to utilize her home bronchodilators. She was given Solumedrol 80 mg and Atrovent/Albuterol en route by EMS, and was found in the ED to have saturations in the 80s, low-low normal SBP, as well as tachycardia. She was placed on BiPAP , administered iv fluids, and bronchodilators, then referred to the medical service.    Allergies:  No Known Allergies    Past Medical History  Diagnosis Date  . COPD (chronic obstructive pulmonary disease)   . Asthma   . Bronchitis   . Anxiety   . Back pain   . Demand ischemia of myocardium 06/28/2011  . Thyroid enlargement 06/28/2011  . MI, acute, non ST segment elevation 06/29/2011    Myoview stress test revealed mild perfusion defect  . Ischemic cardiomyopathy 06/28/2011    EF 25%. Myoview stress test later showed ejection fraction of 65%  . PNA (pneumonia) 06/29/2011  . Acute respiratory failure 06/2011    Vent dependent sec to COPD/PNA  . Hypertension   . Coronary artery disease   . Myocardial infarction   . Pneumonia     Past Surgical History  Procedure Date  . Abdominal hysterectomy     Prior to Admission medications   Medication Sig  Start Date End Date Taking? Authorizing Provider  albuterol (PROVENTIL HFA;VENTOLIN HFA) 108 (90 BASE) MCG/ACT inhaler Inhale 1-2 puffs into the lungs every 6 (six) hours as needed for wheezing. 07/14/11 07/13/12  Erick Blinks, MD  albuterol (PROVENTIL) (2.5 MG/3ML) 0.083% nebulizer solution Take 3 mLs (2.5 mg total) by nebulization every 6 (six) hours as needed. 07/14/11   Erick Blinks, MD  ALPRAZolam [name]) 0.5 MG tablet Take 1 tablet (0.5 mg total) by mouth 3 (three) times daily as needed for anxiety. 08/02/11   Christiane Ha, MD  aspirin EC 81 MG tablet Take 1 tablet (81 mg total) by mouth daily. 07/14/11 07/13/12  Erick Blinks, MD  CALCIUM PO Take by mouth daily.    Historical Provider, MD  cyclobenzaprine (FLEXERIL) 5 MG tablet Take 5 mg by mouth 2 (two) times daily as needed.     Historical Provider, MD  dexlansoprazole (DEXILANT) 60 MG capsule Take 1 capsule (60 mg total) by mouth daily. 07/14/11   Erick Blinks, MD  Fluticasone-Salmeterol (ADVAIR) 250-50 MCG/DOSE AEPB Inhale 1 puff into the lungs every 12 (twelve) hours. 07/14/11   Erick Blinks, MD  gabapentin (NEURONTIN) 300 MG capsule Take 300 mg by mouth 2 (two) times daily. 07/14/11   Erick Blinks, MD  guaiFENesin (MUCINEX) 600 MG 12 hr tablet Take 2 tablets (1,200 mg total) by mouth 2 (two) times daily. 07/14/11 07/13/12  Erick Blinks, MD  omeprazole (PRILOSEC) 20 MG capsule Take 20 mg by mouth 2 (two) times daily.    Historical Provider,  MD  oxybutynin (DITROPAN-XL) 5 MG 24 hr tablet Take 5 mg by mouth daily.    Historical Provider, MD  oxyCODONE-acetaminophen (PERCOCET) 5-325 MG per tablet Take 0.5 tablets by mouth every 4 (four) hours as needed. Back spurs 07/14/11   Erick Blinks, MD  PARoxetine (PAXIL) 30 MG tablet Take 1 tablet (30 mg total) by mouth every morning. 07/14/11   Erick Blinks, MD  polyethylene glycol powder (GLYCOLAX/MIRALAX) powder Take 17 g by mouth daily. 07/14/11   Erick Blinks, MD  roflumilast (DALIRESP) 500 MCG  TABS tablet Take 1 tablet (500 mcg total) by mouth daily. 07/14/11   Erick Blinks, MD  rosuvastatin (CRESTOR) 10 MG tablet Take 10 mg by mouth daily.    Historical Provider, MD  tiotropium (SPIRIVA) 18 MCG inhalation capsule Place 1 capsule (18 mcg total) into inhaler and inhale daily. 07/14/11   Erick Blinks, MD  traZODone (DESYREL) 100 MG tablet Take 50 mg by mouth at bedtime.    Historical Provider, MD  zolpidem (AMBIEN) 10 MG tablet Take 10 mg by mouth at bedtime.    Historical Provider, MD    Social History: Patient reports that she is still smoking, although she has cut down a lot. She does not have any smokeless tobacco history on file. She reports that she does not drink alcohol or use illicit drugs.  Family History  Problem Relation Age of Onset  . Diabetes Mother   . Diabetes Father   . Hypertension Mother   . Hypertension Father     Review of Systems:  As per HPI and chief complaint. Patent was on Bipap at the time of my evaluation, so history-taking is somewhat limited, although patient could nod or shake her head, in response to focused questions. Denies diminished appetite, weight loss, fever, chills, headache, blurred vision, dysphagia, chest pain, abdominal pain, vomiting, diarrhea, dysuria, nocturia, urinary frequency, hematochezia, lower extremity swelling, pain, or redness. The rest of the systems review was unobtainable.   Physical Exam:  General:  Patient does not appear to be in obvious acute distress on BIPAP, at the time of this evaluation. Alert, communicative, although poorly audible in her mask, fully oriented, talking in complete sentences, mildly short of breath at rest.  HEENT:  No clinical pallor. No conjunctival injection or jaundice. Hydration status appears fair.  NECK:  Supple, JVP not seen, no carotid bruits, no palpable lymphadenopathy, no palpable goiter. CHEST:  Bilateral expiratory rhonchi, no crackles. HEART:  Sounds 1 and 2 heard, normal, regular,  no murmurs. ABDOMEN:  Full, soft, non-tender, no palpable organomegaly, no palpable masses, normal bowel sounds. GENITALIA:  Not examined. LOWER EXTREMITIES:  No pitting edema, palpable peripheral pulses. MUSCULOSKELETAL SYSTEM:  Generalized osteoarthritic changes, otherwise, normal. CENTRAL NERVOUS SYSTEM:  No focal neurologic deficit on gross examination.  Labs on Admission:  Results for orders placed during the hospital encounter of 02/20/12 (from the past 48 hour(s))  CBC WITH DIFFERENTIAL     Status: Abnormal   Collection Time   02/20/12  3:00 PM      Component Value Range Comment   WBC 15.0 (*) 4.0 - 10.5 K/uL    RBC 5.31 (*) 3.87 - 5.11 MIL/uL    Hemoglobin 15.4 (*) 12.0 - 15.0 g/dL    HCT 45.4 (*) 09.8 - 46.0 %    MCV 89.8  78.0 - 100.0 fL    MCH 29.0  26.0 - 34.0 pg    MCHC 32.3  30.0 - 36.0 g/dL    RDW  14.8  11.5 - 15.5 %    Platelets 249  150 - 400 K/uL    Neutrophils Relative 80 (*) 43 - 77 %    Neutro Abs 12.0 (*) 1.7 - 7.7 K/uL    Lymphocytes Relative 11 (*) 12 - 46 %    Lymphs Abs 1.7  0.7 - 4.0 K/uL    Monocytes Relative 4  3 - 12 %    Monocytes Absolute 0.5  0.1 - 1.0 K/uL    Eosinophils Relative 6 (*) 0 - 5 %    Eosinophils Absolute 0.8 (*) 0.0 - 0.7 K/uL    Basophils Relative 0  0 - 1 %    Basophils Absolute 0.0  0.0 - 0.1 K/uL   COMPREHENSIVE METABOLIC PANEL     Status: Abnormal   Collection Time   02/20/12  3:00 PM      Component Value Range Comment   Sodium 138  135 - 145 mEq/L    Potassium 4.4  3.5 - 5.1 mEq/L    Chloride 98  96 - 112 mEq/L    CO2 25  19 - 32 mEq/L    Glucose, Bld 148 (*) 70 - 99 mg/dL    BUN 8  6 - 23 mg/dL    Creatinine, Ser 1.61  0.50 - 1.10 mg/dL    Calcium 9.2  8.4 - 09.6 mg/dL    Total Protein 7.4  6.0 - 8.3 g/dL    Albumin 3.6  3.5 - 5.2 g/dL    AST 19  0 - 37 U/L    ALT 11  0 - 35 U/L    Alkaline Phosphatase 123 (*) 39 - 117 U/L    Total Bilirubin 0.2 (*) 0.3 - 1.2 mg/dL    GFR calc non Af Amer >90  >90 mL/min    GFR  calc Af Amer >90  >90 mL/min   BLOOD GAS, ARTERIAL     Status: Abnormal   Collection Time   02/20/12  3:40 PM      Component Value Range Comment   FIO2 0.40      Delivery systems BILEVEL POSITIVE AIRWAY PRESSURE      Inspiratory PAP 10      Expiratory PAP 5      pH, Arterial 7.265 (*) 7.350 - 7.450    pCO2 arterial 72.0 (*) 35.0 - 45.0 mmHg    pO2, Arterial 84.9  80.0 - 100.0 mmHg    Bicarbonate 31.6 (*) 20.0 - 24.0 mEq/L    TCO2 28.7  0 - 100 mmol/L    Acid-Base Excess 2.6 (*) 0.0 - 2.0 mmol/L    O2 Saturation 95.7      Patient temperature 98.6      Collection site LEFT RADIAL      Drawn by [id number]      Sample type ARTERIAL DRAW      Allens test (pass/fail) PASS  PASS   BLOOD GAS, ARTERIAL     Status: Abnormal   Collection Time   02/20/12  7:14 PM      Component Value Range Comment   FIO2 0.40      Delivery systems BILEVEL POSITIVE AIRWAY PRESSURE      Mode BILEVEL POSITIVE AIRWAY PRESSURE      Inspiratory PAP 18      Expiratory PAP 6      pH, Arterial 7.318 (*) 7.350 - 7.450    pCO2 arterial 62.0 (*) 35.0 - 45.0 mmHg    pO2, Arterial  73.9 (*) 80.0 - 100.0 mmHg    Bicarbonate 30.9 (*) 20.0 - 24.0 mEq/L    TCO2 27.7  0 - 100 mmol/L    Acid-Base Excess 3.4 (*) 0.0 - 2.0 mmol/L    O2 Saturation 94.8      Patient temperature 98.6      Collection site RIGHT RADIAL      Drawn by [id number]      Sample type ARTERIAL DRAW      Allens test (pass/fail) PASS  PASS     Radiological Exams on Admission: *RADIOLOGY REPORT*  Clinical Data: Shortness of breath  PORTABLE CHEST - 1 VIEW  Comparison: 07/31/2011  Findings: Cardiomediastinal silhouette is stable. No acute  infiltrate or pulmonary edema. Mild perihilar increased bronchial  markings suspicious for bronchitic changes no acute infiltrate or  pulmonary edema.  IMPRESSION:  No acute infiltrate or pulmonary edema. Mild bronchitic changes  are suspected.  Original Report Authenticated By: Natasha Mead,  M.D.   Assessment/Plan Active Problems:  1. COPD exacerbation: Patient presented with 2 days of dyspnea, wheeze, and a productive cough, which became much worse on  02/20/12. CXR is devoid of acute findings. The clinical picture is consistent with acute exacerbation of her known severe COPD, in the setting of non-compliance with bronchodilator treatment, and continued smoking. Fortunately, she has responded to initial managment measures, and is already clinically improved. She will be admitted to the SDU for close observation, and managed with bronchodilators, steroid, and empiric Levaquin, as well as supportive treatment. D-Dimer will be ordered, in view of tachycardia and borderline BP.  2. Respiratory failure, acute-on-chronic: Patient is known to have chronic respiratory failure, and is on home oxygen. ABG at presentation, confirmed hypercapnic/hypoxic respiratory failure, which is acute on chronic. For discussion of etiology, see above. Patient is on prn Bipap.  3. Chronic back pain: This is not problematic.  4. Tobacco Abuse: Patient unfortunately, continues to smoke, although she has cut down drastically, according to her. She will need further counseling.  5. CAD: Known history of CAD,s/p NSTEMI, with low ejection fraction. Clinically, no features of CHF are evident at this time. 12-Lead EKG shows sinus tachycardia. We shall have to monitor fluid status closely. Will cycle cardiac enzymes for completeness. 6. HTN: BP is low to borderline at this time. Patient will be kept on gentle iv fluids.   Further management will depend on clinical course.  Comment: Patient is FULL CODE.  Time Spent on Admission: 45 mins.   Kanav Kazmierczak,CHRISTOPHER 02/20/2012, 8:53 PM

## 2012-02-20 NOTE — ED Provider Notes (Signed)
History     CSN: [id number]  Arrival date & time 02/20/12  1519   First MD Initiated Contact with Patient 02/20/12 1527      Chief Complaint  Patient presents with  . Respiratory Distress    (Consider location/radiation/quality/duration/timing/severity/associated sxs/prior treatment) HPI Pt with history of COPD requiring prev intubation and on home O2 present with increasing SOB, wheezing and non-productive cough x 1 day. States she has not been using home albuterol. Given solumedrol 80 mg and atrovent/albuterol en route. History and ROS limited by respiratory distress Past Medical History  Diagnosis Date  . COPD (chronic obstructive pulmonary disease)   . Asthma   . Bronchitis   . Anxiety   . Back pain   . Demand ischemia of myocardium 06/28/2011  . Thyroid enlargement 06/28/2011  . MI, acute, non ST segment elevation 06/29/2011    Myoview stress test revealed mild perfusion defect  . Ischemic cardiomyopathy 06/28/2011    EF 25%. Myoview stress test later showed ejection fraction of 65%  . PNA (pneumonia) 06/29/2011  . Acute respiratory failure 06/2011    Vent dependent sec to COPD/PNA  . Hypertension   . Coronary artery disease   . Myocardial infarction   . Pneumonia     Past Surgical History  Procedure Date  . Abdominal hysterectomy     Family History  Problem Relation Age of Onset  . Diabetes Mother   . Diabetes Father   . Hypertension Mother   . Hypertension Father     History  Substance Use Topics  . Smoking status: Current Every Day Smoker  . Smokeless tobacco: Not on file  . Alcohol Use: No    OB History    Grav Para Term Preterm Abortions TAB SAB Ect Mult Living                  Review of Systems  Constitutional: Negative for fever and chills.  Respiratory: Positive for cough, shortness of breath and wheezing.   Cardiovascular: Negative for chest pain and leg swelling.    Allergies  Review of patient's allergies indicates no known  allergies.  Home Medications   Current Outpatient Rx  Name Route Sig Dispense Refill  . ALBUTEROL SULFATE HFA 108 (90 BASE) MCG/ACT IN AERS Inhalation Inhale 1-2 puffs into the lungs every 6 (six) hours as needed for wheezing. 1 Inhaler 0  . ALBUTEROL SULFATE (2.5 MG/3ML) 0.083% IN NEBU Nebulization Take 3 mLs (2.5 mg total) by nebulization every 6 (six) hours as needed. 75 mL 1  . ALPRAZOLAM 0.5 MG PO TABS Oral Take 1 tablet (0.5 mg total) by mouth 3 (three) times daily as needed for anxiety. 15 tablet 0  . ASPIRIN EC 81 MG PO TBEC Oral Take 1 tablet (81 mg total) by mouth daily. 30 tablet 1  . CALCIUM PO Oral Take by mouth daily.    . CYCLOBENZAPRINE HCL 5 MG PO TABS Oral Take 5 mg by mouth 2 (two) times daily as needed.     . DEXLANSOPRAZOLE 60 MG PO CPDR Oral Take 1 capsule (60 mg total) by mouth daily. 30 capsule 1  . FLUTICASONE-SALMETEROL 250-50 MCG/DOSE IN AEPB Inhalation Inhale 1 puff into the lungs every 12 (twelve) hours. 60 each 1  . GABAPENTIN 300 MG PO CAPS Oral Take 300 mg by mouth 2 (two) times daily.    . GUAIFENESIN ER 600 MG PO TB12 Oral Take 2 tablets (1,200 mg total) by mouth 2 (two) times daily. 14 tablet  0  . OMEPRAZOLE 20 MG PO CPDR Oral Take 20 mg by mouth 2 (two) times daily.    . OXYBUTYNIN CHLORIDE ER 5 MG PO TB24 Oral Take 5 mg by mouth daily.    . OXYCODONE-ACETAMINOPHEN 5-325 MG PO TABS Oral Take 0.5 tablets by mouth every 4 (four) hours as needed. Back spurs 30 tablet 0  . PAROXETINE HCL 30 MG PO TABS Oral Take 1 tablet (30 mg total) by mouth every morning. 30 tablet 1  . POLYETHYLENE GLYCOL 3350 PO POWD Oral Take 17 g by mouth daily. 255 g 1  . ROFLUMILAST 500 MCG PO TABS Oral Take 1 tablet (500 mcg total) by mouth daily. 30 tablet 1  . ROSUVASTATIN CALCIUM 10 MG PO TABS Oral Take 10 mg by mouth daily.    Marland Kitchen TIOTROPIUM BROMIDE MONOHYDRATE 18 MCG IN CAPS Inhalation Place 1 capsule (18 mcg total) into inhaler and inhale daily. 30 capsule 1  . TRAZODONE HCL 100 MG  PO TABS Oral Take 50 mg by mouth at bedtime.    Marland Kitchen ZOLPIDEM TARTRATE 10 MG PO TABS Oral Take 10 mg by mouth at bedtime.      BP 99/71  Pulse 101  Temp 98.1 F (36.7 C) (Oral)  Resp 17  SpO2 96%  Physical Exam  Nursing note and vitals reviewed. Constitutional: She is oriented to person, place, and time. She appears well-developed and well-nourished. She appears distressed.  HENT:  Head: Normocephalic and atraumatic.  Mouth/Throat: Oropharynx is clear and moist.  Eyes: EOM are normal. Pupils are equal, round, and reactive to light.  Neck: Normal range of motion. Neck supple.  Cardiovascular: Normal rate and regular rhythm.   Pulmonary/Chest: She is in respiratory distress. She has wheezes. She has no rales.       Decreased BS throughout, prolonged exp phase. Speaking in few word sentences.  Abdominal: Soft. Bowel sounds are normal. She exhibits no distension. There is no tenderness. There is no rebound and no guarding.  Musculoskeletal: Normal range of motion. She exhibits no edema and no tenderness.       No calf swelling or tenderness  Neurological: She is alert and oriented to person, place, and time.       Moves all ext. Follows commands. No focal deficit  Skin: Skin is warm and dry. No rash noted. No erythema.  Psychiatric: She has a normal mood and affect. Her behavior is normal.    ED Course  Procedures (including critical care time)  Labs Reviewed  CBC WITH DIFFERENTIAL - Abnormal; Notable for the following:    WBC 15.0 (*)     RBC 5.31 (*)     Hemoglobin 15.4 (*)     HCT 47.7 (*)     Neutrophils Relative 80 (*)     Neutro Abs 12.0 (*)     Lymphocytes Relative 11 (*)     Eosinophils Relative 6 (*)     Eosinophils Absolute 0.8 (*)     All other components within normal limits  COMPREHENSIVE METABOLIC PANEL - Abnormal; Notable for the following:    Glucose, Bld 148 (*)     Alkaline Phosphatase 123 (*)     Total Bilirubin 0.2 (*)     All other components within  normal limits  BLOOD GAS, ARTERIAL - Abnormal; Notable for the following:    pH, Arterial 7.265 (*)     pCO2 arterial 72.0 (*)     Bicarbonate 31.6 (*)     Acid-Base Excess  2.6 (*)     All other components within normal limits  BLOOD GAS, ARTERIAL - Abnormal; Notable for the following:    pH, Arterial 7.318 (*)     pCO2 arterial 62.0 (*)     pO2, Arterial 73.9 (*)     Bicarbonate 30.9 (*)     Acid-Base Excess 3.4 (*)     All other components within normal limits   Dg Chest Port 1 View  02/20/2012  *RADIOLOGY REPORT*  Clinical Data: Shortness of breath  PORTABLE CHEST - 1 VIEW  Comparison: 07/31/2011  Findings: Cardiomediastinal silhouette is stable.  No acute infiltrate or pulmonary edema.  Mild perihilar increased bronchial markings suspicious for bronchitic changes no acute infiltrate or pulmonary edema.  IMPRESSION: No acute infiltrate or pulmonary edema.  Mild bronchitic changes are suspected.   Original Report Authenticated By: Natasha Mead, M.D.      1. COPD exacerbation   2. Hypercapnia      Date: 02/20/2012  Rate: 122  Rhythm: sinus tachycardia  QRS Axis: normal  Intervals: normal  ST/T Wave abnormalities: normal  Conduction Disutrbances:none  Narrative Interpretation:   Old EKG Reviewed: unchanged  CRITICAL CARE Performed by: Ranae Palms, Lesly Joslyn   Total critical care time: 30  Critical care time was exclusive of separately billable procedures and treating other patients.  Critical care was necessary to treat or prevent imminent or life-threatening deterioration.  Critical care was time spent personally by me on the following activities: development of treatment plan with patient and/or surrogate as well as nursing, discussions with consultants, evaluation of patient's response to treatment, examination of patient, obtaining history from patient or surrogate, ordering and performing treatments and interventions, ordering and review of laboratory studies, ordering and  review of radiographic studies, pulse oximetry and re-evaluation of patient's condition.   MDM  Pt with improved SOB. Pt resting comfortably on bipap. HR and bp normalized.  Discussed with [facility] who wanted pt to be run by critical care before accepting. Critical care believes pt is appropriate for stepdown and to be admitted by [facility]. Dr Brien Few to admit to stepdown        Loren Racer, MD 02/20/12 2021

## 2012-02-20 NOTE — ED Notes (Signed)
[id number]<JY> Expected date:<BR> Expected time:<BR> Means of arrival:<BR> Comments:<BR> Respiratory distress

## 2012-02-20 NOTE — ED Notes (Addendum)
Pt presenting to ed with c/o respiratory distress pt tachypenic and tachycardic on arrival to ER pt with room air oxygen at 80%. Pt denies chest pain at this time. Per ems pt is from [facility] and was diagnosed with flu x 1 week ago, pt with fever, cough and cold x 1 week. Pt unable to speak in full sentences. Pt is alert and oriented at this time,.

## 2012-02-21 DIAGNOSIS — I10 Essential (primary) hypertension: Secondary | ICD-10-CM

## 2012-02-21 DIAGNOSIS — I5022 Chronic systolic (congestive) heart failure: Secondary | ICD-10-CM | POA: Diagnosis present

## 2012-02-21 DIAGNOSIS — I2510 Atherosclerotic heart disease of native coronary artery without angina pectoris: Secondary | ICD-10-CM

## 2012-02-21 LAB — COMPREHENSIVE METABOLIC PANEL
ALT: 9 U/L (ref 0–35)
AST: 11 U/L (ref 0–37)
Albumin: 3.3 g/dL — ABNORMAL LOW (ref 3.5–5.2)
Alkaline Phosphatase: 103 U/L (ref 39–117)
BUN: 10 mg/dL (ref 6–23)
CO2: 29 mEq/L (ref 19–32)
Calcium: 9.3 mg/dL (ref 8.4–10.5)
Chloride: 100 mEq/L (ref 96–112)
Creatinine, Ser: 0.75 mg/dL (ref 0.50–1.10)
GFR calc Af Amer: >90 mL/min (ref >90)
GFR calc non Af Amer: >90 mL/min (ref >90)
Glucose, Bld: 155 mg/dL — ABNORMAL HIGH (ref 70–99)
Potassium: 4.2 mEq/L (ref 3.5–5.1)
Sodium: 139 mEq/L (ref 135–145)
Total Bilirubin: 0.3 mg/dL (ref 0.3–1.2)
Total Protein: 6.7 g/dL (ref 6.0–8.3)

## 2012-02-21 LAB — GLUCOSE, CAPILLARY
Glucose-Capillary: 159 mg/dL — ABNORMAL HIGH (ref 70–99)
Glucose-Capillary: 107 mg/dL — ABNORMAL HIGH (ref 70–99)
Glucose-Capillary: 116 mg/dL — ABNORMAL HIGH (ref 70–99)

## 2012-02-21 LAB — CBC
HCT: 42.9 % (ref 36.0–46.0)
Hemoglobin: 13.7 g/dL (ref 12.0–15.0)
MCH: 28.3 pg (ref 26.0–34.0)
MCHC: 31.9 g/dL (ref 30.0–36.0)
MCV: 88.6 fL (ref 78.0–100.0)
Platelets: 219 10*3/uL (ref 150–400)
RBC: 4.84 MIL/uL (ref 3.87–5.11)
RDW: 14.7 % (ref 11.5–15.5)
WBC: 7.6 10*3/uL (ref 4.0–10.5)

## 2012-02-21 LAB — TROPONIN I
Troponin I: <0.3 ng/mL (ref <0.30)
Troponin I: <0.3 ng/mL (ref <0.30)

## 2012-02-21 LAB — MRSA PCR SCREENING: MRSA by PCR: NEGATIVE

## 2012-02-21 MED ORDER — METHYLPREDNISOLONE SODIUM SUCC 125 MG IJ SOLR
60.0000 mg | Freq: Two times a day (BID) | INTRAMUSCULAR | Status: AC
  Administered 2012-02-21 – 2012-02-22 (×3): 60 mg via INTRAVENOUS
  Filled 2012-02-21 (×3): qty 0.96

## 2012-02-21 MED ORDER — PANTOPRAZOLE SODIUM 40 MG PO TBEC
40.0000 mg | DELAYED_RELEASE_TABLET | Freq: Every day | ORAL | Status: DC
  Administered 2012-02-21 – 2012-02-24 (×4): 40 mg via ORAL
  Filled 2012-02-21 (×4): qty 1

## 2012-02-21 MED ORDER — INSULIN ASPART 100 UNIT/ML ~~LOC~~ SOLN
0.0000 [IU] | Freq: Every day | SUBCUTANEOUS | Status: DC
Start: 2012-02-21 — End: 2012-02-24
  Administered 2012-02-23: 2 [IU] via SUBCUTANEOUS

## 2012-02-21 MED ORDER — LEVALBUTEROL HCL 0.63 MG/3ML IN NEBU
0.6300 mg | INHALATION_SOLUTION | Freq: Three times a day (TID) | RESPIRATORY_TRACT | Status: DC
  Administered 2012-02-21 – 2012-02-24 (×10): 0.63 mg via RESPIRATORY_TRACT
  Filled 2012-02-21 (×12): qty 3

## 2012-02-21 MED ORDER — LEVALBUTEROL HCL 0.63 MG/3ML IN NEBU
0.6300 mg | INHALATION_SOLUTION | Freq: Four times a day (QID) | RESPIRATORY_TRACT | Status: DC | PRN
  Administered 2012-02-22: 0.63 mg via RESPIRATORY_TRACT
  Filled 2012-02-21: qty 3

## 2012-02-21 MED ORDER — INSULIN ASPART 100 UNIT/ML ~~LOC~~ SOLN
0.0000 [IU] | Freq: Three times a day (TID) | SUBCUTANEOUS | Status: DC
  Administered 2012-02-21 – 2012-02-22 (×2): 2 [IU] via SUBCUTANEOUS
  Administered 2012-02-22 (×2): 3 [IU] via SUBCUTANEOUS
  Administered 2012-02-23: 2 [IU] via SUBCUTANEOUS
  Administered 2012-02-23: 5 [IU] via SUBCUTANEOUS
  Administered 2012-02-23: 2 [IU] via SUBCUTANEOUS
  Administered 2012-02-24: 1 [IU] via SUBCUTANEOUS

## 2012-02-21 MED ORDER — IPRATROPIUM BROMIDE 0.02 % IN SOLN
0.5000 mg | Freq: Three times a day (TID) | RESPIRATORY_TRACT | Status: DC
  Administered 2012-02-21 – 2012-02-24 (×10): 0.5 mg via RESPIRATORY_TRACT
  Filled 2012-02-21 (×7): qty 2.5

## 2012-02-21 NOTE — Progress Notes (Signed)
CARE MANAGEMENT NOTE 02/21/2012  Patient:  Kristin Wells, Kristin Wells   Account Number:  [id number]  Date Initiated:  02/21/2012  Documentation initiated by:  Promyse Ardito  Subjective/Objective Assessment:   patient with hx of copd and now exacerbation of , attempted home meds without good results.     Action/Plan:   from home   Anticipated DC Date:  02/24/2012   Anticipated DC Plan:  HOME/SELF CARE  In-house referral  NA      DC Planning Services  NA      [facility] Choice  NA   Choice offered to / List presented to:  NA   DME arranged  NA      DME agency  NA     HH arranged  NA      HH agency  NA   Status of service:  In process, will continue to follow Medicare Important Message given?  NA - LOS <3 / Initial given by admissions (If response is "NO", the following Medicare IM given date fields will be blank) Date Medicare IM given:   Date Additional Medicare IM given:    Discharge Disposition:    Per UR Regulation:  Reviewed for med. necessity/level of care/duration of stay  If discussed at Long Length of Stay Meetings, dates discussed:    Comments:  [id number]/Kenzo Ozment Earlene Plater, RN, BSN, CCM: CHART REVIEWED AND UPDATED. NO DISCHARGE NEEDS PRESENT AT THIS TIME. CASE MANAGEMENT [phone]

## 2012-02-21 NOTE — Clinical Social Work Note (Signed)
CSW met with Pt to explain ADRs and HCPOA paperwork as requested. Pt reports she has this paperwork at her home in [place]. CSW left new packet and Pt was going to look through that one as well. Pt aware we can complete a new packet here if she prefers.   CSW will assist with notarizing paperwork once Pt completes her sections.   Doreen Salvage, LCSW ICU/Stepdown Clinical Social Worker [facility] [phone] Hours 8am-1200pm M-F

## 2012-02-21 NOTE — Progress Notes (Signed)
[facility] PROGRESS NOTE  Kristin Wells YNW:[id number] DOB: Jun 02, 1950 DOA: 02/20/2012 PCP: Dorrene German, MD  Assessment/Plan: Active Problems:  DEPRESSION: Stable. Continue home meds  HTN (hypertension): Elevated: Check BNP. No tachycardia so we'll change albuterol to Xopenex.  Tobacco abuse: Continues to be noncompliant. Counseled.  Anxiety disorder: Stable.  COPD exacerbation: Continue antibiotics, taper steroids, weaned off of BiPAP and on oxygen 2 L  Respiratory failure, acute-on-chronic: Stabilizing. Being transferred to the floor  Chronic back pain: Stable.  CAD (coronary artery disease): Stable.  Systolic CHF, chronic: Appears to be stable. We'll check BNP  Code Status: Full code Family Communication: Plan discussed with patient Disposition Plan: Home in the next few days   Consultants:  None  Procedures:  None  Antibiotics:  Levaquin IV day 2  HPI/Subjective: 61 year old white female past mental history of CAD, systolic CHF with ejection fraction of 25% and noncompliance who continues to smoke presents with acute COPD exacerbation. Patient was admitted to the step and unit placed on BiPAP plus nebulizers plus antibiotics and steroids.  By hospital day 2, patient is feeling better. She still having some wheezing and shortness of breath, but much improved. She does admit to continue smoking. He denies any chest pain palpitations.   Objective: Filed Vitals:   02/21/12 1100 02/21/12 1155 02/21/12 1200 02/21/12 1300  BP:   143/91   Pulse: 101  108 112  Temp:   98.9 F (37.2 C)   TempSrc:   Oral   Resp: 21  19 22   Height:      Weight:      SpO2: 96% 96% 94% 91%    Intake/Output Summary (Last 24 hours) at 02/21/12 1426 Last data filed at 02/21/12 1300  Gross per 24 hour  Intake   1190 ml  Output   1250 ml  Net    -60 ml   Filed Weights   02/21/12 0600  Weight: 68.2 kg (150 lb 5.7 oz)    Exam:   General:  Alert and oriented x3, no acute  distress, fatigued, looks older than stated age  Cardiovascular: Regular rate and rhythm, borderline tachycardia  Respiratory: Decreased breath sounds throughout with mild end expiratory wheezing bilaterally right greater than left  Abdomen: Soft, nontender, nondistended, positive bowel sounds  Extremities: No clubbing or cyanosis, trace pitting edema  Data Reviewed: Basic Metabolic Panel:  Lab 02/21/12 6578 02/20/12 1500  NA 139 138  K 4.2 4.4  CL 100 98  CO2 29 25  GLUCOSE 155* 148*  BUN 10 8  CREATININE 0.75 0.74  CALCIUM 9.3 9.2  MG -- --  PHOS -- --   Liver Function Tests:  Lab 02/21/12 0326 02/20/12 1500  AST 11 19  ALT 9 11  ALKPHOS 103 123*  BILITOT 0.3 0.2*  PROT 6.7 7.4  ALBUMIN 3.3* 3.6   CBC:  Lab 02/21/12 0326 02/20/12 1500  WBC 7.6 15.0*  NEUTROABS -- 12.0*  HGB 13.7 15.4*  HCT 42.9 47.7*  MCV 88.6 89.8  PLT 219 249   Cardiac Enzymes:  Lab 02/21/12 0913 02/21/12 0326 02/20/12 2152  CKTOTAL -- -- --  CKMB -- -- --  CKMBINDEX -- -- --  TROPONINI <0.30 <0.30 <0.30   CBG:  Lab 02/21/12 1130 02/20/12 2147  GLUCAP 159* 151*    Recent Results (from the past 240 hour(s))  MRSA PCR SCREENING     Status: Normal   Collection Time   02/20/12 11:00 PM      Component  Value Range Status Comment   MRSA by PCR NEGATIVE  NEGATIVE Final      Studies: Dg Chest Port 1 View  02/20/2012    IMPRESSION: No acute infiltrate or pulmonary edema.  Mild bronchitic changes are suspected.   Original Report Authenticated By: Natasha Mead, M.D.     Scheduled Meds:   . sodium chloride   Intravenous STAT  . ipratropium  0.5 mg Nebulization Q4H   And  . albuterol  2.5 mg Nebulization Q4H  . albuterol      . albuterol      . heparin  5,000 Units Subcutaneous Q8H  . insulin aspart  0-5 Units Subcutaneous QHS  . insulin aspart  0-9 Units Subcutaneous TID WC  . ipratropium      . levofloxacin (LEVAQUIN) IV  500 mg Intravenous Q24H  . methylPREDNISolone  (SOLU-MEDROL) injection  60 mg Intravenous Once  . methylPREDNISolone (SOLU-MEDROL) injection  60 mg Intravenous Q12H  . pantoprazole  40 mg Oral Q1200  . sodium chloride  500 mL Intravenous Once  . sodium chloride  500 mL Intravenous Once  . sodium chloride  3 mL Intravenous Q12H  . DISCONTD: levofloxacin (LEVAQUIN) IV  750 mg Intravenous Q24H  . DISCONTD: methylPREDNISolone (SOLU-MEDROL) injection  60 mg Intravenous Q6H  . DISCONTD: pantoprazole (PROTONIX) IV  40 mg Intravenous Q24H   Continuous Infusions:   . sodium chloride 50 mL/hr at 02/21/12 0700  . DISCONTD: albuterol 10 mg/hr (02/20/12 1627)    Active Problems:  DEPRESSION  HTN (hypertension)  Tobacco abuse  Anxiety disorder  COPD exacerbation  Respiratory failure, acute-on-chronic  Chronic back pain  CAD (coronary artery disease)  Systolic CHF, chronic    Time spent: 35 minutes    Hollice Espy  [facility] Pager [phone]. If 8PM-8AM, please contact night-coverage at [email or web address], password [facility] 02/21/2012, 2:26 PM  LOS: 1 day

## 2012-02-22 DIAGNOSIS — F411 Generalized anxiety disorder: Secondary | ICD-10-CM

## 2012-02-22 DIAGNOSIS — R0989 Other specified symptoms and signs involving the circulatory and respiratory systems: Secondary | ICD-10-CM

## 2012-02-22 LAB — GLUCOSE, CAPILLARY
Glucose-Capillary: 157 mg/dL — ABNORMAL HIGH (ref 70–99)
Glucose-Capillary: 204 mg/dL — ABNORMAL HIGH (ref 70–99)
Glucose-Capillary: 243 mg/dL — ABNORMAL HIGH (ref 70–99)
Glucose-Capillary: 129 mg/dL — ABNORMAL HIGH (ref 70–99)

## 2012-02-22 LAB — PRO B NATRIURETIC PEPTIDE: Pro B Natriuretic peptide (BNP): 705.9 pg/mL — ABNORMAL HIGH (ref 0–125)

## 2012-02-22 MED ORDER — TIOTROPIUM BROMIDE MONOHYDRATE 18 MCG IN CAPS
18.0000 ug | ORAL_CAPSULE | Freq: Every day | RESPIRATORY_TRACT | Status: DC
  Administered 2012-02-23: 18 ug via RESPIRATORY_TRACT
  Filled 2012-02-22: qty 5

## 2012-02-22 MED ORDER — LEVOFLOXACIN 500 MG PO TABS
500.0000 mg | ORAL_TABLET | ORAL | Status: DC
  Administered 2012-02-22 – 2012-02-23 (×2): 500 mg via ORAL
  Filled 2012-02-22 (×3): qty 1

## 2012-02-22 MED ORDER — ATORVASTATIN CALCIUM 20 MG PO TABS
20.0000 mg | ORAL_TABLET | Freq: Every day | ORAL | Status: DC
  Administered 2012-02-23: 20 mg via ORAL
  Filled 2012-02-22 (×4): qty 1

## 2012-02-22 MED ORDER — ASPIRIN EC 81 MG PO TBEC
81.0000 mg | DELAYED_RELEASE_TABLET | Freq: Every day | ORAL | Status: DC
  Administered 2012-02-23 – 2012-02-24 (×2): 81 mg via ORAL
  Filled 2012-02-22 (×2): qty 1

## 2012-02-22 MED ORDER — ALPRAZOLAM 0.5 MG PO TABS
0.5000 mg | ORAL_TABLET | Freq: Three times a day (TID) | ORAL | Status: DC | PRN
  Administered 2012-02-23 (×2): 0.5 mg via ORAL
  Filled 2012-02-22 (×2): qty 1

## 2012-02-22 MED ORDER — GABAPENTIN 300 MG PO CAPS
300.0000 mg | ORAL_CAPSULE | Freq: Two times a day (BID) | ORAL | Status: DC
  Administered 2012-02-22 – 2012-02-24 (×4): 300 mg via ORAL
  Filled 2012-02-22 (×5): qty 1

## 2012-02-22 MED ORDER — PREDNISONE 50 MG PO TABS
60.0000 mg | ORAL_TABLET | Freq: Every day | ORAL | Status: DC
  Administered 2012-02-23 – 2012-02-24 (×2): 60 mg via ORAL
  Filled 2012-02-22 (×4): qty 1

## 2012-02-22 MED ORDER — FLUTICASONE-SALMETEROL 250-50 MCG/DOSE IN AEPB
1.0000 | INHALATION_SPRAY | Freq: Two times a day (BID) | RESPIRATORY_TRACT | Status: DC
  Administered 2012-02-22 – 2012-02-24 (×4): 1 via RESPIRATORY_TRACT
  Filled 2012-02-22: qty 14

## 2012-02-22 MED ORDER — CALCIUM CARBONATE 1250 (500 CA) MG PO TABS
1.0000 | ORAL_TABLET | Freq: Every day | ORAL | Status: DC
  Administered 2012-02-23 – 2012-02-24 (×2): 500 mg via ORAL
  Filled 2012-02-22 (×2): qty 1

## 2012-02-22 NOTE — Progress Notes (Signed)
PHARMACIST - PHYSICIAN COMMUNICATION CONCERNING: Antibiotic IV to Oral Route Change Policy  RECOMMENDATION: This patient is receiving Levaquin by the intravenous route.  Based on criteria approved by the Pharmacy and Therapeutics Committee, the antibiotic(s) is/are being converted to the equivalent oral dose form(s).   DESCRIPTION: These criteria include:  Patient being treated for a respiratory tract infection, urinary tract infection, or cellulitis  The patient is not neutropenic and does not exhibit a GI malabsorption state  The patient is eating (either orally or via tube) and/or has been taking other orally administered medications for a least 24 hours  The patient is improving clinically and has a Tmax < 100.5  If you have questions about this conversion, please contact the Pharmacy Department  []   [phone] )  [facility] []   [phone] )  [facility]  []   [phone] )  [facility] [x]   [phone] )  [facility]    Geoffry Paradise, PharmD, [place] Pager: [phone] 11:30 AM Pharmacy #: [phone]

## 2012-02-22 NOTE — Progress Notes (Signed)
Subjective: The patient up ambulating with physical therapy. States that she feels much better than she did yesterday. Still complain of some mild expiratory wheezing. She is able to speak in full sentences without any conversational dyspnea Objective: Filed Vitals:   02/22/12 0509 02/22/12 0805 02/22/12 1404 02/22/12 1433  BP: 125/79  137/94   Pulse: 84  82   Temp: 98.3 F (36.8 C)  97.9 F (36.6 C)   TempSrc: Oral  Oral   Resp: 20  20   Height:      Weight:      SpO2: 98% 96% 96% 92%   Weight change:   Intake/Output Summary (Last 24 hours) at 02/22/12 1712 Last data filed at 02/22/12 1404  Gross per 24 hour  Intake    820 ml  Output   2650 ml  Net  -1830 ml    General: Alert, awake, oriented x3, in no acute distress.  HEENT: [place]/AT PEERL, EOMI Neck: Trachea midline,  no masses, no thyromegal,y no JVD, no carotid bruit OROPHARYNX:  Moist, No exudate/ erythema/lesions.  Heart: Regular rate and rhythm, without murmurs, rubs, gallops, PMI non-displaced, no heaves or thrills on palpation.  Lungs: Mild expiratory wheezing.No rhonchi noted. No increased vocal fremitus resonant to percussion  Abdomen: Soft, nontender, nondistended, positive bowel sounds, no masses no hepatosplenomegaly noted..  Neuro: No focal neurological deficits noted cranial nerves II through XII grossly intact. Musculoskeletal: No warm swelling or erythema around joints, no spinal tenderness noted.   Lab Results:  Basename 02/21/12 0326 02/20/12 1500  NA 139 138  K 4.2 4.4  CL 100 98  CO2 29 25  GLUCOSE 155* 148*  BUN 10 8  CREATININE 0.75 0.74  CALCIUM 9.3 9.2  MG -- --  PHOS -- --    Basename 02/21/12 0326 02/20/12 1500  AST 11 19  ALT 9 11  ALKPHOS 103 123*  BILITOT 0.3 0.2*  PROT 6.7 7.4  ALBUMIN 3.3* 3.6   No results found for this basename: LIPASE:2,AMYLASE:2 in the last 72 hours  Basename 02/21/12 0326 02/20/12 1500  WBC 7.6 15.0*  NEUTROABS -- 12.0*  HGB 13.7 15.4*  HCT 42.9  47.7*  MCV 88.6 89.8  PLT 219 249    Basename 02/21/12 0913 02/21/12 0326 02/20/12 2152  CKTOTAL -- -- --  CKMB -- -- --  CKMBINDEX -- -- --  TROPONINI <0.30 <0.30 <0.30   No components found with this basename: POCBNP:3  Basename 02/20/12 2152  DDIMER 0.30   No results found for this basename: HGBA1C:2 in the last 72 hours No results found for this basename: CHOL:2,HDL:2,LDLCALC:2,TRIG:2,CHOLHDL:2,LDLDIRECT:2 in the last 72 hours No results found for this basename: TSH,T4TOTAL,FREET3,T3FREE,THYROIDAB in the last 72 hours No results found for this basename: VITAMINB12:2,FOLATE:2,FERRITIN:2,TIBC:2,IRON:2,RETICCTPCT:2 in the last 72 hours  Micro Results: Recent Results (from the past 240 hour(s))  MRSA PCR SCREENING     Status: Normal   Collection Time   02/20/12 11:00 PM      Component Value Range Status Comment   MRSA by PCR NEGATIVE  NEGATIVE Final     Studies/Results: Dg Chest Port 1 View  02/20/2012  *RADIOLOGY REPORT*  Clinical Data: Shortness of breath  PORTABLE CHEST - 1 VIEW  Comparison: 07/31/2011  Findings: Cardiomediastinal silhouette is stable.  No acute infiltrate or pulmonary edema.  Mild perihilar increased bronchial markings suspicious for bronchitic changes no acute infiltrate or pulmonary edema.  IMPRESSION: No acute infiltrate or pulmonary edema.  Mild bronchitic changes are suspected.  Original Report Authenticated By: Natasha Mead, M.D.     Medications: I have reviewed the patient's current medications. Scheduled Meds:   . heparin  5,000 Units Subcutaneous Q8H  . insulin aspart  0-5 Units Subcutaneous QHS  . insulin aspart  0-9 Units Subcutaneous TID WC  . ipratropium  0.5 mg Nebulization TID  . levalbuterol  0.63 mg Nebulization TID  . levofloxacin  500 mg Oral Q24H  . methylPREDNISolone (SOLU-MEDROL) injection  60 mg Intravenous Q12H  . pantoprazole  40 mg Oral Q1200  . predniSONE  60 mg Oral Q breakfast  . sodium chloride  500 mL Intravenous Once    . sodium chloride  3 mL Intravenous Q12H  . DISCONTD: levofloxacin (LEVAQUIN) IV  500 mg Intravenous Q24H   Continuous Infusions:  PRN Meds:.sodium chloride, acetaminophen, acetaminophen, ipratropium, levalbuterol, morphine injection, ondansetron (ZOFRAN) IV, ondansetron, sodium chloride Assessment/Plan: Patient Active Hospital Problem List: COPD exacerbation (02/20/2012)   Assessment: Patient with an exacerbation of COPD [place] has made significant progress since admission. Based on her current condition I plan to transition her from IV Solu-Medrol to oral prednisone starting on tomorrow. I anticipate a discharge date within 48 hours if the patient continues to progress positively she has over the last 24 hours.  HTN (hypertension) (07/11/2011)   Assessment: Blood pressure well controlled    Respiratory failure, acute-on-chronic (02/20/2012)   Assessment: Secondary to exacerbation of COPD. Presently the patient is at her baseline of 2 L/min of oxygen    Chronic back pain (02/20/2012)   Assessment: Does not appear to be an issue during this hospitalization.   CAD (coronary artery disease) (02/21/2012)   Assessment: Quiescent.   Systolic CHF, chronic (02/21/2012)   Assessment: Appears well compensated at present.      LOS: 2 days

## 2012-02-22 NOTE — Evaluation (Signed)
Physical Therapy Evaluation Patient Details Name: Kristin Wells MRN: [id number] DOB: October 14, 1950 Today's Date: 02/22/2012 Time: 4098-1191 PT Time Calculation (min): 19 min  PT Assessment / Plan / Recommendation Clinical Impression  Pt presents with COPD exacerbation with history of CAD, CHF and EF of approx 25-30%.  Tolerated OOB to/from restroom on RA, pt then desaturated to 86%.  Applied 3LO2 while ambulating with O2 sats at 93%, decreased O2 to 2LO2 for further ambulation with O2 sats at 92%.  MD okayed pt remaining on 2Lo2 in room.  Pt will benefit from skilled PT in acute venue to address deficits.  PT recommends HHPT for follow up at D/C to maximize pts independence.     PT Assessment  Patient needs continued PT services    Follow Up Recommendations  Home health PT    Does the patient have the potential to tolerate intense rehabilitation      Barriers to Discharge Decreased caregiver support      Equipment Recommendations  Rolling walker with 5" wheels    Recommendations for Other Services OT consult   Frequency Min 3X/week    Precautions / Restrictions Precautions Precautions: Fall Restrictions Weight Bearing Restrictions: No   Pertinent Vitals/Pain No pain      Mobility  Bed Mobility Bed Mobility: Supine to Sit Supine to Sit: 6: Modified independent (Device/Increase time) Details for Bed Mobility Assistance: increased time Transfers Transfers: Sit to Stand;Stand to Sit Sit to Stand: 5: Supervision;From bed;From toilet Stand to Sit: 5: Supervision;To toilet;To chair/3-in-1 Details for Transfer Assistance: Performed x 2 in order to use restroom.  Min cues for hand placement and safety.  Ambulation/Gait Ambulation/Gait Assistance: 4: Min guard Ambulation Distance (Feet): 150 Feet Assistive device: Straight cane;Rolling walker Ambulation/Gait Assistance Details: Initially had pt ambulate with her own cane, however pt unsteady and noted increased SOB,  transitioned to RW with marked increase in balance and less work of breathing.  Gait Pattern: Step-through pattern;Decreased stride length Gait velocity: decreased Stairs: No Wheelchair Mobility Wheelchair Mobility: No    Shoulder Instructions     Exercises     PT Diagnosis: Difficulty walking;Generalized weakness  PT Problem List: Decreased strength;Decreased activity tolerance;Decreased balance;Decreased mobility;Decreased knowledge of use of DME;Cardiopulmonary status limiting activity PT Treatment Interventions: DME instruction;Gait training;Stair training;Functional mobility training;Therapeutic activities;Therapeutic exercise;Balance training   PT Goals Acute Rehab PT Goals PT Goal Formulation: With patient Time For Goal Achievement: 02/29/12 Potential to Achieve Goals: Good Pt will go Sit to Stand: with modified independence PT Goal: Sit to Stand - Progress: Goal set today Pt will go Stand to Sit: with modified independence PT Goal: Stand to Sit - Progress: Goal set today Pt will Ambulate: 51 - 150 feet;with modified independence;with least restrictive assistive device PT Goal: Ambulate - Progress: Goal set today Pt will Go Up / Down Stairs: 3-5 stairs;with supervision;with least restrictive assistive device PT Goal: Up/Down Stairs - Progress: Goal set today Pt will Perform Home Exercise Program: with supervision, verbal cues required/provided PT Goal: Perform Home Exercise Program - Progress: Goal set today  Visit Information  Last PT Received On: 02/22/12 Assistance Needed: +1    Subjective Data  Subjective: I like to keep moving.  Patient Stated Goal: to get back home.    Prior Functioning  Home Living Lives With: Alone Type of Home: House (Section 8 housing, has own living room, kitchen, etc. ) Home Access: Stairs to enter [employer] of Steps: 5 Entrance Stairs-Rails: None Home Layout: One level Home Adaptive Equipment:  Straight cane Prior  Function Level of Independence: Independent with assistive device(s) Able to Take Stairs?: Yes Comments: States that she has cart that she uses to push items around sometimes, helps with balance.  Communication Communication: No difficulties    Cognition  Overall Cognitive Status: Appears within functional limits for tasks assessed/performed Arousal/Alertness: Awake/alert Orientation Level: Appears intact for tasks assessed Behavior During Session: [facility] for tasks performed    Extremity/Trunk Assessment Right Lower Extremity Assessment RLE ROM/Strength/Tone: Deficits RLE ROM/Strength/Tone Deficits: Generalized weakness, however WFL per functional assessment.  RLE Sensation: WFL - Light Touch RLE Coordination: WFL - gross/fine motor Left Lower Extremity Assessment LLE ROM/Strength/Tone: Deficits LLE ROM/Strength/Tone Deficits: Generalized weakness, however WFL per functional assessment.  LLE Sensation: WFL - Light Touch LLE Coordination: WFL - gross/fine motor Trunk Assessment Trunk Assessment: Normal   Balance    End of Session PT - End of Session Equipment Utilized During Treatment: Oxygen Activity Tolerance: Patient limited by fatigue Patient left: in chair;with call bell/phone within reach Nurse Communication: Mobility status  GP     Page, Meribeth Mattes 02/22/2012, 11:54 AM

## 2012-02-23 LAB — BASIC METABOLIC PANEL
BUN: 22 mg/dL (ref 6–23)
CO2: 27 mEq/L (ref 19–32)
Calcium: 9.7 mg/dL (ref 8.4–10.5)
Chloride: 100 mEq/L (ref 96–112)
Creatinine, Ser: 0.96 mg/dL (ref 0.50–1.10)
GFR calc Af Amer: 73 mL/min — ABNORMAL LOW (ref >90)
GFR calc non Af Amer: 63 mL/min — ABNORMAL LOW (ref >90)
Glucose, Bld: 178 mg/dL — ABNORMAL HIGH (ref 70–99)
Potassium: 4.4 mEq/L (ref 3.5–5.1)
Sodium: 136 mEq/L (ref 135–145)

## 2012-02-23 LAB — MAGNESIUM: Magnesium: 2.4 mg/dL (ref 1.5–2.5)

## 2012-02-23 LAB — GLUCOSE, CAPILLARY
Glucose-Capillary: 176 mg/dL — ABNORMAL HIGH (ref 70–99)
Glucose-Capillary: 163 mg/dL — ABNORMAL HIGH (ref 70–99)
Glucose-Capillary: 279 mg/dL — ABNORMAL HIGH (ref 70–99)
Glucose-Capillary: 210 mg/dL — ABNORMAL HIGH (ref 70–99)

## 2012-02-23 NOTE — Evaluation (Signed)
Occupational Therapy Evaluation Patient Details Name: Kristin Wells MRN: [id number] DOB: 1951-01-22 Today's Date: 02/23/2012 Time: 0865-7846 OT Time Calculation (min): 38 min  OT Assessment / Plan / Recommendation Clinical Impression  Pt is a 61 yo female who presents with a COPD exacerbation. Skilled OT recommended to maximize independence with BADLs to mod I level in prep for safe d/c home with HHOT.    OT Assessment  Patient needs continued OT Services    Follow Up Recommendations  No OT follow up    Barriers to Discharge      Equipment Recommendations  Rolling walker with 5" wheels    Recommendations for Other Services    Frequency  Min 2X/week    Precautions / Restrictions Precautions Precautions: Fall   Pertinent Vitals/Pain Pt reports chronic back pain. Repositioned for comfort.    ADL  Grooming: Performed;Wash/dry hands;Wash/dry face;Supervision/safety Where Assessed - Grooming: Unsupported standing Upper Body Bathing: Performed;Supervision/safety Where Assessed - Upper Body Bathing: Unsupported standing Lower Body Bathing: Performed;Supervision/safety Where Assessed - Lower Body Bathing: Unsupported sit to stand Upper Body Dressing: Performed;Supervision/safety Where Assessed - Upper Body Dressing: Unsupported standing Lower Body Dressing: Performed;Supervision/safety Where Assessed - Lower Body Dressing: Unsupported sit to stand Toilet Transfer: Performed;Supervision/safety Toilet Transfer Method: Sit to Barista: Raised toilet seat with arms (or 3-in-1 over toilet) Toileting - Clothing Manipulation and Hygiene: Performed;Supervision/safety Where Assessed - Toileting Clothing Manipulation and Hygiene: Sit to stand from 3-in-1 or toilet Transfers/Ambulation Related to ADLs: Pt ambulated to the bathroom with supervision and RW.  ADL Comments: Pt bathed while at sink and took several RBs without being cued.    OT Diagnosis: Generalized  weakness  OT Problem List: Decreased activity tolerance;Decreased knowledge of use of DME or AE;Pain OT Treatment Interventions: Self-care/ADL training;Therapeutic activities;DME and/or AE instruction;Energy conservation;Patient/family education   OT Goals Acute Rehab OT Goals OT Goal Formulation: With patient Time For Goal Achievement: 03/08/12 Potential to Achieve Goals: Good ADL Goals Pt Will Perform Grooming: with modified independence;Standing at sink (x 3 tasks to improve standing activity tolerance.) ADL Goal: Grooming - Progress: Goal set today Pt Will Transfer to Toilet: with modified independence;3-in-1;Comfort height toilet;Ambulation;Raised toilet seat with arms ADL Goal: Toilet Transfer - Progress: Goal set today Pt Will Perform Toileting - Clothing Manipulation: with modified independence;Sitting on 3-in-1 or toilet;Standing ADL Goal: Toileting - Clothing Manipulation - Progress: Goal set today Pt Will Perform Toileting - Hygiene: with modified independence;Sit to stand from 3-in-1/toilet ADL Goal: Toileting - Hygiene - Progress: Goal set today Pt Will Perform Tub/Shower Transfer: with modified independence;Ambulation ADL Goal: Tub/Shower Transfer - Progress: Goal set today Additional ADL Goal #1: Pt will complete all aspects of bathing and dressing including item retrieval with mod I. Miscellaneous OT Goals Miscellaneous OT Goal #1: Pt will incorporate 3 energy conservation strategies into ADL routine without prompting. OT Goal: Miscellaneous Goal #1 - Progress: Goal set today  Visit Information  Last OT Received On: 02/23/12 Assistance Needed: +1    Subjective Data  Subjective: I live with all crazy people. Patient Stated Goal: Not asked.   Prior Functioning     Home Living Lives With: Alone Type of Home: Apartment (Section 8 housing, has own living room, kitchen, etc.) Home Access: Stairs to enter [employer] of Steps: 5 Entrance Stairs-Rails:  None Home Layout: One level Bathroom Shower/Tub: Engineer, manufacturing systems: Standard Home Adaptive Equipment: Shower chair with back;Grab bars in shower;Bedside commode/3-in-1;Straight cane;Quad cane Prior Function Level of Independence: Independent with  assistive device(s) Able to Take Stairs?: Yes Vocation: Retired Comments: Pt states she has a cart that she has cart that she uses to push items around sometimes. Communication Communication: No difficulties Dominant Hand: Right         Vision/Perception     Cognition  Overall Cognitive Status: Appears within functional limits for tasks assessed/performed Arousal/Alertness: Awake/alert Orientation Level: Appears intact for tasks assessed Behavior During Session: [facility] for tasks performed    Extremity/Trunk Assessment Right Upper Extremity Assessment RUE ROM/Strength/Tone: [facility] for tasks assessed Left Upper Extremity Assessment LUE ROM/Strength/Tone: WFL for tasks assessed     Mobility Bed Mobility Supine to Sit: 6: Modified independent (Device/Increase time) Transfers Sit to Stand: 5: Supervision;From chair/3-in-1 Stand to Sit: 5: Supervision;To chair/3-in-1 Details for Transfer Assistance: Min cues for hand placement and safety.     Shoulder Instructions     Exercise     Balance Static Standing Balance Static Standing - Balance Support: No upper extremity supported;During functional activity Static Standing - Level of Assistance: 5: Stand by assistance   End of Session OT - End of Session Activity Tolerance: Patient tolerated treatment well Patient left: in chair;with call bell/phone within reach  GO     Mikai Meints A OTR/L [phone] 02/23/2012, 3:09 PM

## 2012-02-23 NOTE — Progress Notes (Signed)
Subjective: The patient reports that she feels much better today.  Objective: Filed Vitals:   02/22/12 2107 02/23/12 0522 02/23/12 0840 02/23/12 1300  BP: 114/75 121/82  101/85  Pulse: 95 79  117  Temp: 98.4 F (36.9 C) 98.5 F (36.9 C)  98.3 F (36.8 C)  TempSrc: Oral Oral  Oral  Resp: 20 20  22   Height:      Weight:  67.7 kg (149 lb 4 oz)    SpO2: 92% 95% 99% 95%   Weight change:   Intake/Output Summary (Last 24 hours) at 02/23/12 1638 Last data filed at 02/23/12 0700  Gross per 24 hour  Intake    660 ml  Output   1400 ml  Net   -740 ml    General: Alert, awake, oriented x3, in no acute distress.  HEENT: [place]/AT PEERL, EOMI Neck: Trachea midline,  no masses, no thyromegal,y no JVD, no carotid bruit OROPHARYNX:  Moist, No exudate/ erythema/lesions.  Heart: Regular rate and rhythm, without murmurs, rubs, gallops.  Lungs: Clear to auscultation. No wheezing or rhonchi noted. Abdomen: Soft, nontender, nondistended, positive bowel sounds, no masses no hepatosplenomegaly noted..  Musculoskeletal: No warm swelling or erythema around joints, no spinal tenderness noted.   Lab Results:  [facility] 02/23/12 0430 02/21/12 0326  NA 136 139  K 4.4 4.2  CL 100 100  CO2 27 29  GLUCOSE 178* 155*  BUN 22 10  CREATININE 0.96 0.75  CALCIUM 9.7 9.3  MG 2.4 --  PHOS -- --    Basename 02/21/12 0326  AST 11  ALT 9  ALKPHOS 103  BILITOT 0.3  PROT 6.7  ALBUMIN 3.3*   No results found for this basename: LIPASE:2,AMYLASE:2 in the last 72 hours  Basename 02/21/12 0326  WBC 7.6  NEUTROABS --  HGB 13.7  HCT 42.9  MCV 88.6  PLT 219    Basename 02/21/12 0913 02/21/12 0326 02/20/12 2152  CKTOTAL -- -- --  CKMB -- -- --  CKMBINDEX -- -- --  TROPONINI <0.30 <0.30 <0.30   No components found with this basename: POCBNP:3  Basename 02/20/12 2152  DDIMER 0.30   No results found for this basename: HGBA1C:2 in the last 72 hours No results found for this basename:  CHOL:2,HDL:2,LDLCALC:2,TRIG:2,CHOLHDL:2,LDLDIRECT:2 in the last 72 hours No results found for this basename: TSH,T4TOTAL,FREET3,T3FREE,THYROIDAB in the last 72 hours No results found for this basename: VITAMINB12:2,FOLATE:2,FERRITIN:2,TIBC:2,IRON:2,RETICCTPCT:2 in the last 72 hours  Micro Results: Recent Results (from the past 240 hour(s))  MRSA PCR SCREENING     Status: Normal   Collection Time   02/20/12 11:00 PM      Component Value Range Status Comment   MRSA by PCR NEGATIVE  NEGATIVE Final     Studies/Results: Dg Chest Port 1 View  02/20/2012  *RADIOLOGY REPORT*  Clinical Data: Shortness of breath  PORTABLE CHEST - 1 VIEW  Comparison: 07/31/2011  Findings: Cardiomediastinal silhouette is stable.  No acute infiltrate or pulmonary edema.  Mild perihilar increased bronchial markings suspicious for bronchitic changes no acute infiltrate or pulmonary edema.  IMPRESSION: No acute infiltrate or pulmonary edema.  Mild bronchitic changes are suspected.   Original Report Authenticated By: Natasha Mead, M.D.     Medications: I have reviewed the patient's current medications. Scheduled Meds:    . aspirin EC  81 mg Oral Daily  . atorvastatin  20 mg Oral q1800  . calcium carbonate  1 tablet Oral Daily  . Fluticasone-Salmeterol  1 puff Inhalation Q12H  .  gabapentin  300 mg Oral BID  . heparin  5,000 Units Subcutaneous Q8H  . insulin aspart  0-5 Units Subcutaneous QHS  . insulin aspart  0-9 Units Subcutaneous TID WC  . ipratropium  0.5 mg Nebulization TID  . levalbuterol  0.63 mg Nebulization TID  . levofloxacin  500 mg Oral Q24H  . methylPREDNISolone (SOLU-MEDROL) injection  60 mg Intravenous Q12H  . pantoprazole  40 mg Oral Q1200  . predniSONE  60 mg Oral Q breakfast  . sodium chloride  500 mL Intravenous Once  . sodium chloride  3 mL Intravenous Q12H  . tiotropium  18 mcg Inhalation Daily   Continuous Infusions:  PRN Meds:.sodium chloride, acetaminophen, acetaminophen, ALPRAZolam,  ipratropium, levalbuterol, morphine injection, ondansetron (ZOFRAN) IV, ondansetron, sodium chloride Assessment/Plan: Patient Active Hospital Problem List: COPD exacerbation (02/20/2012)   Assessment: Patient with an exacerbation of COPD [place] has made significant progress. And transitioned over to oral prednisone today and if she continues to do well we'll plan to discharge home within the next 24 hours.  HTN (hypertension) (07/11/2011)   Assessment: Blood pressure well controlled    Respiratory failure, acute-on-chronic (02/20/2012)   Assessment: Secondary to exacerbation of COPD. Presently the patient is at her baseline of 2 L/min of oxygen    Chronic back pain (02/20/2012)   Assessment: Does not appear to be an issue during this hospitalization.   CAD (coronary artery disease) (02/21/2012)   Assessment: Quiescent.   Systolic CHF, chronic (02/21/2012)   Assessment: Appears well compensated at present.      LOS: 3 days

## 2012-02-24 LAB — GLUCOSE, CAPILLARY
Glucose-Capillary: 95 mg/dL (ref 70–99)
Glucose-Capillary: 126 mg/dL — ABNORMAL HIGH (ref 70–99)

## 2012-02-24 MED ORDER — PREDNISONE 10 MG PO TABS: ORAL_TABLET | ORAL | Status: DC

## 2012-02-24 MED ORDER — LEVOFLOXACIN 500 MG PO TABS: 500.0000 mg | ORAL_TABLET | ORAL | Status: DC

## 2012-02-24 NOTE — Progress Notes (Signed)
Occupational Therapy Treatment Patient Details Name: Kristin Wells MRN: [id number] DOB: [birth year] Today's Date: 02/24/2012 Time: 1535-1551 16 minutes    OT Assessment / Plan / Recommendation Comments on Treatment Session      Follow Up Recommendations  Home health OT (for iadls)    Barriers to Discharge       Equipment Recommendations  Rolling walker with 5" wheels    Recommendations for Other Services    Frequency     Plan      Precautions / Restrictions     Pertinent Vitals/Pain Back--constant    ADL  Toilet Transfer: Performed;Supervision/safety Toilet Transfer Method: Sit to stand Toilet Transfer Equipment: Comfort height toilet Tub/Shower Transfer: Performed;Supervision/safety (simulated ledge of tub over shower stall) Tub/Shower Transfer Method: Science writer: Shower seat with back Transfers/Ambulation Related to ADLs: supervision ambulating to bathroom; no LOB.  Recommended that pt side step into tub with hands on wall for increased safety over her normal method ADL Comments: Educated on energy conservation principles.  Handout given.  Pt already follows several.      OT Diagnosis:    OT Problem List:   OT Treatment Interventions:     OT Goals ADL Goals Pt Will Transfer to Toilet: with modified independence;3-in-1;Comfort height toilet;Ambulation;Raised toilet seat with arms ADL Goal: Toilet Transfer - Progress: Progressing toward goals Pt Will Perform Tub/Shower Transfer: with modified independence;Ambulation ADL Goal: Tub/Shower Transfer - Progress: Progressing toward goals  Visit Information  Last OT Received On: 02/24/12    Subjective Data      Prior Functioning       Cognition  Overall Cognitive Status: Appears within functional limits for tasks assessed/performed Arousal/Alertness: Awake/alert Orientation Level: Appears intact for tasks assessed Behavior During Session: [facility] for tasks performed     Mobility  Shoulder Instructions Transfers Sit to Stand: 5: Supervision (cues as recliner wasn't locked)       Exercises      Balance     End of Session OT - End of Session Activity Tolerance: Patient tolerated treatment well Patient left:  (NT came with W/C for discharge)  GO     Kristin Wells 02/24/2012, 3:58 PM Marica Otter, OTR/L [phone] 02/24/2012

## 2012-02-24 NOTE — Progress Notes (Signed)
CSW confirmed transportation home with [facility] (ph#: [phone]), they will be here to pick her up between 3-3:30. CSW provided RN, Julia's [phone] for them to call when they are close to the hospital. CSW signing off.   Unice Bailey, LCSW [facility] Clinical Social Worker cell #: [phone]

## 2012-02-24 NOTE — Discharge Summary (Signed)
Kristin Wells MRN: [id number] DOB/AGE: July 11, 1950 61 y.o.  Admit date: 02/20/2012 Discharge date: 02/24/2012  Primary Care Physician:  Dorrene German, MD   Discharge Diagnoses:   Patient Active Problem List  Diagnosis  . HYPOKALEMIA  . ANXIETY  . SMOKER  . DEPRESSION  . Other chronic pain  . COPD  . Acute respiratory failure  . Demand ischemia of myocardium  . Hyperglycemia  . Thyroid enlargement  . Ischemic cardiomyopathy  . MI, acute, non ST segment elevation  . COPD (chronic obstructive pulmonary disease) with acute bronchitis  . HTN (hypertension)  . Tobacco abuse  . Anxiety disorder  . Hypercapnia  . Lethargy  . COPD exacerbation  . Respiratory failure, acute-on-chronic  . Chronic back pain  . CAD (coronary artery disease)  . Systolic CHF, chronic    DISCHARGE MEDICATION:   Medication List     As of 02/24/2012 12:29 PM    STOP taking these medications         guaiFENesin 600 MG 12 hr tablet   Commonly known as: MUCINEX      zolpidem 10 MG tablet   Commonly known as: AMBIEN      TAKE these medications         albuterol 108 (90 BASE) MCG/ACT inhaler   Commonly known as: PROVENTIL HFA;VENTOLIN HFA   Inhale 1-2 puffs into the lungs every 6 (six) hours as needed for wheezing.      albuterol (2.5 MG/3ML) 0.083% nebulizer solution   Commonly known as: PROVENTIL   Take 3 mLs (2.5 mg total) by nebulization every 6 (six) hours as needed.      ALPRAZolam 0.5 MG tablet   Commonly known as: XANAX   Take 1 tablet (0.5 mg total) by mouth 3 (three) times daily as needed for anxiety.      aspirin EC 81 MG tablet   Take 1 tablet (81 mg total) by mouth daily.      calcium carbonate 1250 MG tablet   Commonly known as: OS-CAL - dosed in mg of elemental calcium   Take 1 tablet by mouth daily.      cyclobenzaprine 5 MG tablet   Commonly known as: FLEXERIL   Take 5 mg by mouth 2 (two) times daily as needed.      dexlansoprazole 60 MG capsule   Commonly  known as: DEXILANT   Take 1 capsule (60 mg total) by mouth daily.      Fluticasone-Salmeterol 250-50 MCG/DOSE Aepb   Commonly known as: ADVAIR   Inhale 1 puff into the lungs every 12 (twelve) hours.      gabapentin 300 MG capsule   Commonly known as: NEURONTIN   Take 300 mg by mouth 2 (two) times daily.      levofloxacin 500 MG tablet   Commonly known as: LEVAQUIN   Take 1 tablet (500 mg total) by mouth daily.      omeprazole 20 MG capsule   Commonly known as: PRILOSEC   Take 20 mg by mouth 2 (two) times daily.      oxybutynin 5 MG 24 hr tablet   Commonly known as: DITROPAN-XL   Take 5 mg by mouth daily.      oxyCODONE-acetaminophen 5-325 MG per tablet   Commonly known as: PERCOCET/ROXICET   Take 0.5 tablets by mouth every 4 (four) hours as needed. Back spurs      PARoxetine 30 MG tablet   Commonly known as: PAXIL   Take 1 tablet (30  mg total) by mouth every morning.      polyethylene glycol powder powder   Commonly known as: GLYCOLAX/MIRALAX   Take 17 g by mouth daily.      predniSONE 10 MG tablet   Commonly known as: DELTASONE   50 mg po daily x 1, then 40 mg po daily x 1, then 30 mg po daily x 1, then 20 mg po daily x 1, then 10 mg po daily x 1 then stop      roflumilast 500 MCG Tabs tablet   Commonly known as: DALIRESP   Take 1 tablet (500 mcg total) by mouth daily.      rosuvastatin 10 MG tablet   Commonly known as: CRESTOR   Take 10 mg by mouth daily.      tiotropium 18 MCG inhalation capsule   Commonly known as: SPIRIVA   Place 1 capsule (18 mcg total) into inhaler and inhale daily.      traZODone 100 MG tablet   Commonly known as: DESYREL   Take 50 mg by mouth at bedtime.          Consults:     SIGNIFICANT DIAGNOSTIC STUDIES:  Dg Chest Port 1 View  02/20/2012  *RADIOLOGY REPORT*  Clinical Data: Shortness of breath  PORTABLE CHEST - 1 VIEW  Comparison: 07/31/2011  Findings: Cardiomediastinal silhouette is stable.  No acute infiltrate or  pulmonary edema.  Mild perihilar increased bronchial markings suspicious for bronchitic changes no acute infiltrate or pulmonary edema.  IMPRESSION: No acute infiltrate or pulmonary edema.  Mild bronchitic changes are suspected.   Original Report Authenticated By: Natasha Mead, M.D.        Recent Results (from the past 240 hour(s))  MRSA PCR SCREENING     Status: Normal   Collection Time   02/20/12 11:00 PM      Component Value Range Status Comment   MRSA by PCR NEGATIVE  NEGATIVE Final     BRIEF ADMITTING H & P: This is a 61 year old female, with history of anxiety, chronic back pain, goiter, HTN, CAD, s/p previous NSTEMI, EF 25% to 30% per [facility] cardiology office 2 D echocardiogram of 08/2011, asthma, COPD/chronic respiratory failure on home Oxygen, and recurrent hospitalizations for acute on chronic respiratory failure. She was hospitalized 07/30/11-08/02/11 at [facility] and required brief intubation/Mechanical ventilation at that time. She continues to smoke. She now presents with wheezing and non-productive cough for 2 days, associated with production of whitish phlegm. According to patient she has been non-compliant with her home Albuterol, for the preceding 4 days and today, symptoms failed to alleviate, despite her attempts to utilize her home bronchodilators. She was given Solumedrol 80 mg and Atrovent/Albuterol en route by EMS, and was found in the ED to have saturations in the 80s, low-low normal SBP, as well as tachycardia. She was placed on BiPAP , administered iv fluids, and bronchodilators, then referred to the medical service.    Hospital Course:  Present on Admission:  .COPD exacerbation: Pt was admitted with acute exacerbation of COPD. And he shares with IV Solu-Medrol and other maintenance medications as well as rescue medications. It is supportive care with oxygen and Levaquin was also added for sterilization of the airways. The patient improved clinically and was transitioned over to  oral prednisone. She is continued to have clinical improvement on oral prednisone is being sent home with a tapering dose of prednisone over the next 5 days. On admission the patient initially had oxygen requirement  which is now been resolved as she is improved clinically. The time of discharge the patient is ambulating on room air with O2 sats in the low 90s.   Marland KitchenRespiratory failure, acute-on-chronic: Eric to acute exacerbation of COPD. See above  . Decreased ejection fraction on echo in February 2013: Echocardiogram in February of 2013 that showed an ejection fraction of 25-30%. She was seen by cardiology at that time and felt that this was Takotsubo's  And that the marked impairment of LV systolic function on admission at that time had reversed.She had no evidence of decompensation during this shin. The recommendation at that time was no ACE inhibitor as this was a transient phenomenon.  .Tobacco abuse: Pt directed to www.stopsmoking .gov and counseled on smoking cessation.  Marland KitchenHTN (hypertension): BP well controlled.    Disposition and Follow-up:  Pt is being discharged to home in stable condition and is to follow up with her PMD within 1 week.      Discharge Orders    Future Orders Please Complete By Expires   Diet general      Activity as tolerated - No restrictions         DISCHARGE EXAM:  General: Alert, awake, oriented x3, in no acute distress.  Vital Signs: Blood pressure 111/78, pulse 66, temperature 98.1 F (36.7 C), temperature source Oral, resp. rate 18, height 5\' 1"  (1.549 m), weight 65.3 kg (143 lb 15.4 oz), SpO2 90.00%. HEENT: [place]/AT PEERL, EOMI  Neck: Trachea midline, no masses, no thyromegal,y no JVD, no carotid bruit  OROPHARYNX: Moist, No exudate/ erythema/lesions.  Heart: Regular rate and rhythm, without murmurs, rubs, gallops.  Lungs: Clear to auscultation. No wheezing or rhonchi noted.  Abdomen: Soft, nontender, nondistended, positive bowel sounds, no masses no  hepatosplenomegaly noted..  Musculoskeletal: No warm swelling or erythema around joints, no spinal tenderness noted    Basename 02/23/12 0430  NA 136  K 4.4  CL 100  CO2 27  GLUCOSE 178*  BUN 22  CREATININE 0.96  CALCIUM 9.7  MG 2.4  PHOS --   Total time for discharge process including decision making and face to face time greater than 30 minutes.  Signed: MATTHEWS,MICHELLE A. 02/24/2012, 12:29 PM

## 2012-02-24 NOTE — Progress Notes (Signed)
OT Cancellation Note  Patient Details Name: Kristin Wells MRN: [id number] DOB: [birth year]   Cancelled Treatment:    Reason Eval/Treat Not Completed: Other (comment) (breakfast came as OT started talking to pt) Will reattempt later today as schedule allows.  [name], [name] [phone] 02/24/2012, 11:51 AM

## 2012-02-24 NOTE — Progress Notes (Signed)
Pt oxygen saturations 93% on room air at rest and 90% on room air while ambulating.

## 2012-02-29 ENCOUNTER — Other Ambulatory Visit (HOSPITAL_COMMUNITY): Payer: Self-pay | Admitting: Internal Medicine

## 2012-02-29 DIAGNOSIS — Z139 Encounter for screening, unspecified: Secondary | ICD-10-CM

## 2012-03-05 ENCOUNTER — Ambulatory Visit (HOSPITAL_COMMUNITY)
Admission: RE | Admit: 2012-03-05 | Discharge: 2012-03-05 | Disposition: A | Discharge status: Home / Self Care | Payer: PRIVATE HEALTH INSURANCE | Source: Ambulatory Visit | Attending: Internal Medicine | Admitting: Internal Medicine

## 2012-03-05 DIAGNOSIS — Z1231 Encounter for screening mammogram for malignant neoplasm of breast: Secondary | ICD-10-CM | POA: Insufficient documentation

## 2012-03-05 DIAGNOSIS — Z139 Encounter for screening, unspecified: Secondary | ICD-10-CM

## 2012-03-26 ENCOUNTER — Other Ambulatory Visit: Payer: Self-pay | Admitting: Internal Medicine

## 2012-03-26 DIAGNOSIS — K439 Ventral hernia without obstruction or gangrene: Secondary | ICD-10-CM

## 2012-03-29 ENCOUNTER — Ambulatory Visit
Admission: RE | Admit: 2012-03-29 | Discharge: 2012-03-29 | Disposition: A | Discharge status: Home / Self Care | Payer: PRIVATE HEALTH INSURANCE | Source: Ambulatory Visit | Attending: Internal Medicine | Admitting: Internal Medicine

## 2012-03-29 DIAGNOSIS — K439 Ventral hernia without obstruction or gangrene: Secondary | ICD-10-CM

## 2012-04-04 ENCOUNTER — Other Ambulatory Visit (HOSPITAL_COMMUNITY): Payer: Self-pay

## 2012-04-10 ENCOUNTER — Ambulatory Visit (HOSPITAL_COMMUNITY)
Admission: RE | Admit: 2012-04-10 | Discharge: 2012-04-10 | Disposition: A | Discharge status: Home / Self Care | Payer: PRIVATE HEALTH INSURANCE | Source: Ambulatory Visit | Attending: Pulmonary Disease | Admitting: Pulmonary Disease

## 2012-04-10 DIAGNOSIS — J45909 Unspecified asthma, uncomplicated: Secondary | ICD-10-CM | POA: Insufficient documentation

## 2012-04-10 DIAGNOSIS — J40 Bronchitis, not specified as acute or chronic: Secondary | ICD-10-CM | POA: Insufficient documentation

## 2012-04-10 LAB — BLOOD GAS, ARTERIAL
Acid-Base Excess: 3.9 mmol/L — ABNORMAL HIGH (ref 0.0–2.0)
Bicarbonate: 27.9 mEq/L — ABNORMAL HIGH (ref 20.0–24.0)
O2 Saturation: 92.9 %
Patient temperature: 37
TCO2: 24.5 mmol/L (ref 0–100)
pCO2 arterial: 42.1 mmHg (ref 35.0–45.0)
pH, Arterial: 7.437 (ref 7.350–7.450)
pO2, Arterial: 62.6 mmHg — ABNORMAL LOW (ref 80.0–100.0)

## 2012-04-10 MED ORDER — ALBUTEROL SULFATE (5 MG/ML) 0.5% IN NEBU
2.5000 mg | INHALATION_SOLUTION | Freq: Once | RESPIRATORY_TRACT | Status: AC
  Administered 2012-04-10: 2.5 mg via RESPIRATORY_TRACT

## 2012-04-11 NOTE — Procedures (Signed)
NAMELILLYBETH, TAL               ACCOUNT NO.:  [id number]  MEDICAL RECORD NO.:  [id number]  LOCATION:  RESP                          FACILITY:  [facility]  PHYSICIAN:  Regan Mcbryar L. Juanetta Gosling, M.D.DATE OF BIRTH:  Jan 24, 1951  DATE OF PROCEDURE: DATE OF DISCHARGE:  04/10/2012                           PULMONARY FUNCTION TEST   REASON FOR PULMONARY FUNCTION TESTING:  Asthma and bronchitis. 1. Spirometry shows a moderate ventilatory defect with evidence of     airflow obstruction. 2. Lung volumes show air trapping. 3. DLCO is moderately reduced, but does correct somewhat when volume     is accounted. 4. Airway resistance is elevated confirming the presence of airflow     obstruction. 5. Arterial blood gas shows relative resting hypoxemia. 6. This study is consistent with asthma/COPD.     Farryn Linares L. Juanetta Gosling, M.D.     ELH/MEDQ  D:  04/10/2012  T:  04/11/2012  Job:  [id number]

## 2012-04-12 ENCOUNTER — Other Ambulatory Visit: Payer: Self-pay | Admitting: Gastroenterology

## 2012-04-12 DIAGNOSIS — R109 Unspecified abdominal pain: Secondary | ICD-10-CM

## 2012-04-18 ENCOUNTER — Other Ambulatory Visit: Payer: PRIVATE HEALTH INSURANCE

## 2012-05-24 LAB — PULMONARY FUNCTION TEST

## 2012-07-30 ENCOUNTER — Inpatient Hospital Stay (HOSPITAL_COMMUNITY)
Admission: EM | Admit: 2012-07-30 | Discharge: 2012-08-02 | DRG: 189 | Disposition: A | Discharge status: Home / Self Care | Payer: PRIVATE HEALTH INSURANCE | Attending: Pulmonary Disease | Admitting: Pulmonary Disease

## 2012-07-30 ENCOUNTER — Encounter (HOSPITAL_COMMUNITY): Payer: Self-pay | Admitting: *Deleted

## 2012-07-30 ENCOUNTER — Emergency Department (HOSPITAL_COMMUNITY): Payer: PRIVATE HEALTH INSURANCE

## 2012-07-30 DIAGNOSIS — J45901 Unspecified asthma with (acute) exacerbation: Secondary | ICD-10-CM | POA: Diagnosis present

## 2012-07-30 DIAGNOSIS — F411 Generalized anxiety disorder: Secondary | ICD-10-CM | POA: Diagnosis present

## 2012-07-30 DIAGNOSIS — K429 Umbilical hernia without obstruction or gangrene: Secondary | ICD-10-CM | POA: Diagnosis present

## 2012-07-30 DIAGNOSIS — F329 Major depressive disorder, single episode, unspecified: Secondary | ICD-10-CM | POA: Diagnosis present

## 2012-07-30 DIAGNOSIS — F32A Depression, unspecified: Secondary | ICD-10-CM | POA: Diagnosis present

## 2012-07-30 DIAGNOSIS — I10 Essential (primary) hypertension: Secondary | ICD-10-CM | POA: Diagnosis present

## 2012-07-30 DIAGNOSIS — K219 Gastro-esophageal reflux disease without esophagitis: Secondary | ICD-10-CM | POA: Diagnosis present

## 2012-07-30 DIAGNOSIS — I2589 Other forms of chronic ischemic heart disease: Secondary | ICD-10-CM | POA: Diagnosis present

## 2012-07-30 DIAGNOSIS — Z79899 Other long term (current) drug therapy: Secondary | ICD-10-CM

## 2012-07-30 DIAGNOSIS — G8929 Other chronic pain: Secondary | ICD-10-CM | POA: Diagnosis present

## 2012-07-30 DIAGNOSIS — F17200 Nicotine dependence, unspecified, uncomplicated: Secondary | ICD-10-CM | POA: Diagnosis present

## 2012-07-30 DIAGNOSIS — Z9981 Dependence on supplemental oxygen: Secondary | ICD-10-CM

## 2012-07-30 DIAGNOSIS — F3289 Other specified depressive episodes: Secondary | ICD-10-CM | POA: Diagnosis present

## 2012-07-30 DIAGNOSIS — E785 Hyperlipidemia, unspecified: Secondary | ICD-10-CM | POA: Diagnosis present

## 2012-07-30 DIAGNOSIS — Z8701 Personal history of pneumonia (recurrent): Secondary | ICD-10-CM

## 2012-07-30 DIAGNOSIS — Z833 Family history of diabetes mellitus: Secondary | ICD-10-CM

## 2012-07-30 DIAGNOSIS — I252 Old myocardial infarction: Secondary | ICD-10-CM

## 2012-07-30 DIAGNOSIS — J9600 Acute respiratory failure, unspecified whether with hypoxia or hypercapnia: Principal | ICD-10-CM | POA: Diagnosis present

## 2012-07-30 DIAGNOSIS — R109 Unspecified abdominal pain: Secondary | ICD-10-CM | POA: Diagnosis present

## 2012-07-30 DIAGNOSIS — J439 Emphysema, unspecified: Secondary | ICD-10-CM | POA: Diagnosis present

## 2012-07-30 DIAGNOSIS — I2510 Atherosclerotic heart disease of native coronary artery without angina pectoris: Secondary | ICD-10-CM | POA: Diagnosis present

## 2012-07-30 DIAGNOSIS — J441 Chronic obstructive pulmonary disease with (acute) exacerbation: Secondary | ICD-10-CM | POA: Diagnosis present

## 2012-07-30 DIAGNOSIS — M549 Dorsalgia, unspecified: Secondary | ICD-10-CM | POA: Diagnosis present

## 2012-07-30 LAB — BLOOD GAS, ARTERIAL
Acid-Base Excess: 3 mmol/L — ABNORMAL HIGH (ref 0.0–2.0)
Bicarbonate: 27.3 mEq/L — ABNORMAL HIGH (ref 20.0–24.0)
O2 Content: 2 L/min
O2 Saturation: 95.8 %
Patient temperature: 37
TCO2: 23.1 mmol/L (ref 0–100)
pCO2 arterial: 43.3 mmHg (ref 35.0–45.0)
pH, Arterial: 7.415 (ref 7.350–7.450)
pO2, Arterial: 80.4 mmHg (ref 80.0–100.0)

## 2012-07-30 LAB — CBC WITH DIFFERENTIAL/PLATELET
Basophils Absolute: 0 10*3/uL (ref 0.0–0.1)
Basophils Relative: 0 % (ref 0–1)
Eosinophils Absolute: 1 10*3/uL — ABNORMAL HIGH (ref 0.0–0.7)
Eosinophils Relative: 11 % — ABNORMAL HIGH (ref 0–5)
HCT: 49.2 % — ABNORMAL HIGH (ref 36.0–46.0)
Hemoglobin: 16.3 g/dL — ABNORMAL HIGH (ref 12.0–15.0)
Lymphocytes Relative: 27 % (ref 12–46)
Lymphs Abs: 2.5 10*3/uL (ref 0.7–4.0)
MCH: 28.2 pg (ref 26.0–34.0)
MCHC: 33.1 g/dL (ref 30.0–36.0)
MCV: 85 fL (ref 78.0–100.0)
Monocytes Absolute: 0.4 10*3/uL (ref 0.1–1.0)
Monocytes Relative: 4 % (ref 3–12)
Neutro Abs: 5.3 10*3/uL (ref 1.7–7.7)
Neutrophils Relative %: 58 % (ref 43–77)
Platelets: 207 10*3/uL (ref 150–400)
RBC: 5.79 MIL/uL — ABNORMAL HIGH (ref 3.87–5.11)
RDW: 14.8 % (ref 11.5–15.5)
WBC: 9.2 10*3/uL (ref 4.0–10.5)

## 2012-07-30 LAB — BASIC METABOLIC PANEL
BUN: 5 mg/dL — ABNORMAL LOW (ref 6–23)
CO2: 30 mEq/L (ref 19–32)
Calcium: 9.7 mg/dL (ref 8.4–10.5)
Chloride: 98 mEq/L (ref 96–112)
Creatinine, Ser: 0.81 mg/dL (ref 0.50–1.10)
GFR calc Af Amer: 90 mL/min — ABNORMAL LOW (ref >90)
GFR calc non Af Amer: 77 mL/min — ABNORMAL LOW (ref >90)
Glucose, Bld: 101 mg/dL — ABNORMAL HIGH (ref 70–99)
Potassium: 3.6 mEq/L (ref 3.5–5.1)
Sodium: 140 mEq/L (ref 135–145)

## 2012-07-30 LAB — PROTIME-INR
INR: 1.01 (ref 0.00–1.49)
Prothrombin Time: 13.2 seconds (ref 11.6–15.2)

## 2012-07-30 LAB — TROPONIN I: Troponin I: <0.3 ng/mL (ref <0.30)

## 2012-07-30 LAB — PRO B NATRIURETIC PEPTIDE: Pro B Natriuretic peptide (BNP): 4358 pg/mL — ABNORMAL HIGH (ref 0–125)

## 2012-07-30 MED ORDER — ROFLUMILAST 500 MCG PO TABS
500.0000 ug | ORAL_TABLET | Freq: Every day | ORAL | Status: DC
  Administered 2012-07-31 – 2012-08-02 (×3): 500 ug via ORAL
  Filled 2012-07-30 (×5): qty 1

## 2012-07-30 MED ORDER — ACETAMINOPHEN 650 MG RE SUPP: 650.0000 mg | Freq: Four times a day (QID) | RECTAL | Status: DC | PRN

## 2012-07-30 MED ORDER — MAGNESIUM SULFATE 40 MG/ML IJ SOLN
4.0000 g | Freq: Once | INTRAMUSCULAR | Status: AC
  Administered 2012-07-30: 4 g via INTRAVENOUS
  Filled 2012-07-30: qty 100

## 2012-07-30 MED ORDER — METHYLPREDNISOLONE SODIUM SUCC 125 MG IJ SOLR
125.0000 mg | Freq: Four times a day (QID) | INTRAMUSCULAR | Status: DC
  Administered 2012-07-30 – 2012-07-31 (×4): 125 mg via INTRAVENOUS
  Filled 2012-07-30 (×4): qty 2

## 2012-07-30 MED ORDER — ONDANSETRON HCL 4 MG PO TABS: 4.0000 mg | ORAL_TABLET | Freq: Four times a day (QID) | ORAL | Status: DC | PRN

## 2012-07-30 MED ORDER — PANTOPRAZOLE SODIUM 40 MG PO TBEC
40.0000 mg | DELAYED_RELEASE_TABLET | Freq: Every day | ORAL | Status: DC
  Administered 2012-07-30: 40 mg via ORAL
  Filled 2012-07-30: qty 1

## 2012-07-30 MED ORDER — ALBUTEROL SULFATE HFA 108 (90 BASE) MCG/ACT IN AERS: 1.0000 | INHALATION_SPRAY | Freq: Four times a day (QID) | RESPIRATORY_TRACT | Status: DC | PRN

## 2012-07-30 MED ORDER — OXYCODONE-ACETAMINOPHEN 5-325 MG PO TABS
0.5000 | ORAL_TABLET | ORAL | Status: DC | PRN
  Administered 2012-07-30 – 2012-08-01 (×3): 0.5 via ORAL
  Filled 2012-07-30 (×3): qty 1

## 2012-07-30 MED ORDER — ALUM & MAG HYDROXIDE-SIMETH 200-200-20 MG/5ML PO SUSP: 30.0000 mL | Freq: Four times a day (QID) | ORAL | Status: DC | PRN

## 2012-07-30 MED ORDER — MOMETASONE FURO-FORMOTEROL FUM 100-5 MCG/ACT IN AERO
2.0000 | INHALATION_SPRAY | Freq: Two times a day (BID) | RESPIRATORY_TRACT | Status: DC
  Administered 2012-07-31 – 2012-08-02 (×5): 2 via RESPIRATORY_TRACT
  Filled 2012-07-30: qty 8.8

## 2012-07-30 MED ORDER — ONDANSETRON HCL 4 MG/2ML IJ SOLN: 4.0000 mg | Freq: Four times a day (QID) | INTRAMUSCULAR | Status: DC | PRN

## 2012-07-30 MED ORDER — ALBUTEROL SULFATE (5 MG/ML) 0.5% IN NEBU
2.5000 mg | INHALATION_SOLUTION | Freq: Four times a day (QID) | RESPIRATORY_TRACT | Status: DC
  Administered 2012-07-30 – 2012-08-02 (×10): 2.5 mg via RESPIRATORY_TRACT
  Filled 2012-07-30 (×9): qty 0.5

## 2012-07-30 MED ORDER — OXYBUTYNIN CHLORIDE ER 5 MG PO TB24
5.0000 mg | ORAL_TABLET | Freq: Every day | ORAL | Status: DC
  Administered 2012-07-31 – 2012-08-02 (×3): 5 mg via ORAL
  Filled 2012-07-30 (×6): qty 1

## 2012-07-30 MED ORDER — SODIUM CHLORIDE 0.9 % IV SOLN
INTRAVENOUS | Status: DC
  Administered 2012-07-30 – 2012-08-01 (×3): via INTRAVENOUS

## 2012-07-30 MED ORDER — PANTOPRAZOLE SODIUM 40 MG PO TBEC
80.0000 mg | DELAYED_RELEASE_TABLET | Freq: Every day | ORAL | Status: DC
  Administered 2012-07-31: 80 mg via ORAL
  Filled 2012-07-30: qty 2

## 2012-07-30 MED ORDER — LORAZEPAM BOLUS VIA INFUSION: 1.0000 mg | INTRAVENOUS | Status: DC | PRN

## 2012-07-30 MED ORDER — ALBUTEROL (5 MG/ML) CONTINUOUS INHALATION SOLN
10.0000 mg/h | INHALATION_SOLUTION | Freq: Once | RESPIRATORY_TRACT | Status: AC
  Administered 2012-07-30: 10 mg/h via RESPIRATORY_TRACT
  Filled 2012-07-30: qty 20

## 2012-07-30 MED ORDER — ALPRAZOLAM 0.5 MG PO TABS
0.5000 mg | ORAL_TABLET | Freq: Three times a day (TID) | ORAL | Status: DC | PRN
  Administered 2012-07-30 – 2012-07-31 (×2): 0.5 mg via ORAL
  Filled 2012-07-30 (×2): qty 1

## 2012-07-30 MED ORDER — METHYLPREDNISOLONE SODIUM SUCC 125 MG IJ SOLR
125.0000 mg | Freq: Once | INTRAMUSCULAR | Status: AC
  Administered 2012-07-30: 125 mg via INTRAVENOUS
  Filled 2012-07-30: qty 2

## 2012-07-30 MED ORDER — ENOXAPARIN SODIUM 40 MG/0.4ML ~~LOC~~ SOLN
40.0000 mg | SUBCUTANEOUS | Status: DC
  Administered 2012-07-30 – 2012-08-01 (×3): 40 mg via SUBCUTANEOUS
  Filled 2012-07-30 (×3): qty 0.4

## 2012-07-30 MED ORDER — TIOTROPIUM BROMIDE MONOHYDRATE 18 MCG IN CAPS
18.0000 ug | ORAL_CAPSULE | Freq: Every day | RESPIRATORY_TRACT | Status: DC
  Administered 2012-07-31 – 2012-08-02 (×3): 18 ug via RESPIRATORY_TRACT
  Filled 2012-07-30: qty 5

## 2012-07-30 MED ORDER — IPRATROPIUM BROMIDE 0.02 % IN SOLN
0.5000 mg | Freq: Once | RESPIRATORY_TRACT | Status: AC
  Administered 2012-07-30: 0.5 mg via RESPIRATORY_TRACT
  Filled 2012-07-30: qty 2.5

## 2012-07-30 MED ORDER — TRAZODONE HCL 50 MG PO TABS
50.0000 mg | ORAL_TABLET | Freq: Every day | ORAL | Status: DC
  Administered 2012-07-30 – 2012-08-01 (×3): 50 mg via ORAL
  Filled 2012-07-30 (×5): qty 1

## 2012-07-30 MED ORDER — POLYETHYLENE GLYCOL 3350 17 GM/SCOOP PO POWD
17.0000 g | Freq: Every day | ORAL | Status: DC
  Filled 2012-07-30: qty 255

## 2012-07-30 MED ORDER — ACETAMINOPHEN 325 MG PO TABS: 650.0000 mg | ORAL_TABLET | Freq: Four times a day (QID) | ORAL | Status: DC | PRN

## 2012-07-30 MED ORDER — LORAZEPAM 2 MG/ML IJ SOLN
1.0000 mg | INTRAMUSCULAR | Status: DC | PRN
  Administered 2012-07-30 – 2012-07-31 (×2): 1 mg via INTRAVENOUS
  Filled 2012-07-30 (×3): qty 1

## 2012-07-30 MED ORDER — PAROXETINE HCL 20 MG PO TABS
30.0000 mg | ORAL_TABLET | Freq: Every day | ORAL | Status: DC
  Administered 2012-07-31 – 2012-08-02 (×3): 30 mg via ORAL
  Filled 2012-07-30 (×2): qty 2
  Filled 2012-07-30 (×2): qty 1
  Filled 2012-07-30: qty 2
  Filled 2012-07-30: qty 1

## 2012-07-30 MED ORDER — GABAPENTIN 300 MG PO CAPS
300.0000 mg | ORAL_CAPSULE | Freq: Two times a day (BID) | ORAL | Status: DC
  Administered 2012-07-30 – 2012-08-02 (×6): 300 mg via ORAL
  Filled 2012-07-30 (×10): qty 1

## 2012-07-30 MED ORDER — SODIUM CHLORIDE 0.9 % IJ SOLN
3.0000 mL | Freq: Two times a day (BID) | INTRAMUSCULAR | Status: DC
  Administered 2012-07-30 – 2012-07-31 (×2): 3 mL via INTRAVENOUS

## 2012-07-30 MED ORDER — METHYLPREDNISOLONE SODIUM SUCC 125 MG IJ SOLR: 125.0000 mg | Freq: Four times a day (QID) | INTRAMUSCULAR | Status: DC

## 2012-07-30 NOTE — ED Notes (Signed)
Pt requesting a "nerve pill", states that she can't afford to refill her prescription of Xanax and has not had any in 2 weeks, EDP notified of pt's request

## 2012-07-30 NOTE — ED Provider Notes (Signed)
History     CSN: [id number]  Arrival date & time 07/30/12  1613   First MD Initiated Contact with Patient 07/30/12 1621      Chief Complaint  Patient presents with  . Shortness of Breath    (Consider location/radiation/quality/duration/timing/severity/associated sxs/prior treatment) HPI Comments: History of COPD presenting with difficulty breathing for the past several hours. Saw PCP's office and sent to the ED. She went outside "for some air" and developed difficulty breathing but has had increased shortness of breath since Friday. She is oxygen dependent. Complains of dry cough, chest pressure. No abdominal pain, nausea or vomiting. No leg pain or swelling. History limited by respiratory distress.  The history is provided by the patient. The history is limited by the condition of the patient.    Past Medical History  Diagnosis Date  . COPD (chronic obstructive pulmonary disease)   . Asthma   . Bronchitis   . Anxiety   . Back pain   . Demand ischemia of myocardium 06/28/2011  . Thyroid enlargement 06/28/2011  . MI, acute, non ST segment elevation 06/29/2011    Myoview stress test revealed mild perfusion defect  . Ischemic cardiomyopathy 06/28/2011    EF 25%. Myoview stress test later showed ejection fraction of 65%  . PNA (pneumonia) 06/29/2011  . Acute respiratory failure 06/2011    Vent dependent sec to COPD/PNA  . Hypertension   . Coronary artery disease   . Myocardial infarction   . Pneumonia     Past Surgical History  Procedure Laterality Date  . Abdominal hysterectomy      Family History  Problem Relation Age of Onset  . Diabetes Mother   . Diabetes Father   . Hypertension Mother   . Hypertension Father     History  Substance Use Topics  . Smoking status: Current Every Day Smoker    Types: Cigarettes  . Smokeless tobacco: Not on file  . Alcohol Use: No    OB History   Grav Para Term Preterm Abortions TAB SAB Ect Mult Living                  Review  of Systems  Unable to perform ROS: Severe respiratory distress  Respiratory: Positive for shortness of breath.     Allergies  Review of patient's allergies indicates no known allergies.  Home Medications   No current outpatient prescriptions on file.  BP 111/72  Pulse 110  Temp(Src) 97.6 F (36.4 C) (Oral)  Resp 24  Ht 5\' 3"  (1.6 m)  Wt 140 lb (63.504 kg)  BMI 24.81 kg/m2  SpO2 96%  Physical Exam  Constitutional: She is oriented to person, place, and time. She appears well-developed and well-nourished. She appears distressed.  Tachypnea, respiratory distress with pursed lip breathing, speaking in short phrases  HENT:  Head: Normocephalic and atraumatic.  Mouth/Throat: Oropharynx is clear and moist.  Eyes: Conjunctivae and EOM are normal. Pupils are equal, round, and reactive to light.  Neck: Normal range of motion.  Cardiovascular: Normal rate, regular rhythm and normal heart sounds.   No murmur heard. Pulmonary/Chest: She is in respiratory distress. She has wheezes.  Abdominal: Soft. There is no tenderness. There is no rebound and no guarding.  Musculoskeletal: Normal range of motion. She exhibits no edema and no tenderness.  Neurological: She is alert and oriented to person, place, and time. No cranial nerve deficit. She exhibits normal muscle tone. Coordination normal.  Skin: Skin is warm.    ED Course  Procedures (including critical care time)  Labs Reviewed  CBC WITH DIFFERENTIAL - Abnormal; Notable for the following:    RBC 5.79 (*)    Hemoglobin 16.3 (*)    HCT 49.2 (*)    Eosinophils Relative 11 (*)    Eosinophils Absolute 1.0 (*)    All other components within normal limits  BASIC METABOLIC PANEL - Abnormal; Notable for the following:    Glucose, Bld 101 (*)    BUN 5 (*)    GFR calc non Af Amer 77 (*)    GFR calc Af Amer 90 (*)    All other components within normal limits  BLOOD GAS, ARTERIAL - Abnormal; Notable for the following:    Bicarbonate 27.3  (*)    Acid-Base Excess 3.0 (*)    All other components within normal limits  PRO B NATRIURETIC PEPTIDE - Abnormal; Notable for the following:    Pro B Natriuretic peptide (BNP) 4358.0 (*)    All other components within normal limits  TROPONIN I  PROTIME-INR   Dg Chest Portable 1 View  07/30/2012  *RADIOLOGY REPORT*  Clinical Data: Chest pain  PORTABLE CHEST - 1 VIEW  Comparison: 02/10/2012  Findings: Background COPD/emphysema noted.  Mild cardiac enlargement but normal vascularity.  Negative for CHF, pneumonia, collapse, consolidation, effusion or pneumothorax.  Monitor leads overlie the chest.  Atherosclerosis of the aorta noted.  Trachea is midline.  IMPRESSION: COPD/emphysema.  No acute process.   Original Report Authenticated By: Judie Petit. Shick, M.D.      1. COPD exacerbation       MDM  Difficulty breathing with history of COPD oxygen dependence. She is in respiratory distress with pursed lip breathing and wheezing. She Is placed on BiPAP on arrival. Given nebulizers, steroids, IV magnesium  CXR clear. Work of breathing improved after nebs, steroids, mag.  No CO2 retention on ABG.  Good oxygenation.  Continuous nebs given. Bipap at bedside, holding for now as work of breathing is significantly better. Dr. Ouida Sills to admit.   Date: 07/30/2012  Rate: 116  Rhythm: sinus tachycardia  QRS Axis: normal  Intervals: normal  ST/T Wave abnormalities: nonspecific ST/T changes  Conduction Disutrbances:none  Narrative Interpretation: Lateral T wave inversions  Old EKG Reviewed: changes noted  CRITICAL CARE Performed by: Glynn Octave   Total critical care time: 30  Critical care time was exclusive of separately billable procedures and treating other patients.  Critical care was necessary to treat or prevent imminent or life-threatening deterioration.  Critical care was time spent personally by me on the following activities: development of treatment plan with patient and/or  surrogate as well as nursing, discussions with consultants, evaluation of patient's response to treatment, examination of patient, obtaining history from patient or surrogate, ordering and performing treatments and interventions, ordering and review of laboratory studies, ordering and review of radiographic studies, pulse oximetry and re-evaluation of patient's condition.        Glynn Octave, MD 07/30/12 2132

## 2012-07-30 NOTE — ED Notes (Signed)
Increased sob since Friday, speaking short phrases in triage.  Sent from Dr. Juanetta Gosling office.  Denies cp.

## 2012-07-31 ENCOUNTER — Inpatient Hospital Stay (HOSPITAL_COMMUNITY): Payer: PRIVATE HEALTH INSURANCE

## 2012-07-31 ENCOUNTER — Encounter (HOSPITAL_COMMUNITY): Payer: Self-pay | Admitting: Gastroenterology

## 2012-07-31 DIAGNOSIS — R142 Eructation: Secondary | ICD-10-CM

## 2012-07-31 DIAGNOSIS — R143 Flatulence: Secondary | ICD-10-CM

## 2012-07-31 DIAGNOSIS — R109 Unspecified abdominal pain: Secondary | ICD-10-CM

## 2012-07-31 DIAGNOSIS — R141 Gas pain: Secondary | ICD-10-CM

## 2012-07-31 MED ORDER — BISACODYL 5 MG PO TBEC
10.0000 mg | DELAYED_RELEASE_TABLET | Freq: Two times a day (BID) | ORAL | Status: DC | PRN
  Administered 2012-08-01: 10 mg via ORAL
  Filled 2012-07-31: qty 2

## 2012-07-31 MED ORDER — POLYETHYLENE GLYCOL 3350 17 G PO PACK
17.0000 g | PACK | Freq: Every day | ORAL | Status: DC
  Administered 2012-07-31: 17 g via ORAL
  Filled 2012-07-31: qty 1

## 2012-07-31 MED ORDER — IOHEXOL 300 MG/ML  SOLN
100.0000 mL | Freq: Once | INTRAMUSCULAR | Status: AC | PRN
  Administered 2012-07-31: 100 mL via INTRAVENOUS

## 2012-07-31 MED ORDER — PANTOPRAZOLE SODIUM 40 MG PO TBEC
40.0000 mg | DELAYED_RELEASE_TABLET | Freq: Two times a day (BID) | ORAL | Status: DC
  Administered 2012-07-31 – 2012-08-02 (×5): 40 mg via ORAL
  Filled 2012-07-31 (×5): qty 1

## 2012-07-31 MED ORDER — RISAQUAD PO CAPS
1.0000 | ORAL_CAPSULE | Freq: Every day | ORAL | Status: DC
  Administered 2012-07-31 – 2012-08-02 (×3): 1 via ORAL
  Filled 2012-07-31 (×3): qty 1

## 2012-07-31 MED ORDER — METHYLPREDNISOLONE SODIUM SUCC 40 MG IJ SOLR
40.0000 mg | Freq: Four times a day (QID) | INTRAMUSCULAR | Status: DC
  Administered 2012-07-31 – 2012-08-01 (×2): 40 mg via INTRAVENOUS
  Filled 2012-07-31 (×2): qty 1

## 2012-07-31 MED ORDER — IOHEXOL 300 MG/ML  SOLN
50.0000 mL | Freq: Once | INTRAMUSCULAR | Status: AC | PRN
  Administered 2012-07-31: 50 mL via ORAL

## 2012-07-31 NOTE — Progress Notes (Signed)
Subjective: This is a patient I saw in my office yesterday. She was having what seemed to be a mild COPD exacerbation and was given prescription for an antibiotic and steroids but apparently got much worse approximately an hour and a half later. She then came to the emergency room and was admitted from there. Part of the problem appears to be that she's had trouble getting her medications and she particularly has not had her Xanax in 2 weeks according to the emergency department note  Objective: Vital signs in last 24 hours: Temp:  [97.4 F (36.3 C)-97.7 F (36.5 C)] 97.5 F (36.4 C) (03/25 0612) Pulse Rate:  [85-123] 85 (03/25 0612) Resp:  [20-40] 22 (03/25 0612) BP: (99-206)/(65-125) 160/80 mmHg (03/25 0612) SpO2:  [84 %-98 %] 95 % (03/25 0655) FiO2 (%):  [28 %] 28 % (03/24 2034) Weight:  [63.1 kg (139 lb 1.8 oz)-63.504 kg (140 lb)] 63.5 kg (139 lb 15.9 oz) (03/25 0612) Weight change:  Last BM Date: 07/28/12  Intake/Output from previous day: 03/24 0701 - 03/25 0700 In: 525.8 [I.V.:525.8] Out: -   PHYSICAL EXAM General appearance: alert, cooperative and moderate distress Resp: rhonchi bilaterally Cardio: regular rate and rhythm, S1, S2 normal, no murmur, click, rub or gallop GI: soft, non-tender; bowel sounds normal; no masses,  no organomegaly Extremities: extremities normal, atraumatic, no cyanosis or edema  Lab Results:    Basic Metabolic Panel:  Recent Labs  40/98/11 1648  NA 140  K 3.6  CL 98  CO2 30  GLUCOSE 101*  BUN 5*  CREATININE 0.81  CALCIUM 9.7   Liver Function Tests: No results found for this basename: AST, ALT, ALKPHOS, BILITOT, PROT, ALBUMIN,  in the last 72 hours No results found for this basename: LIPASE, AMYLASE,  in the last 72 hours No results found for this basename: AMMONIA,  in the last 72 hours CBC:  Recent Labs  07/30/12 1648  WBC 9.2  NEUTROABS 5.3  HGB 16.3*  HCT 49.2*  MCV 85.0  PLT 207   Cardiac Enzymes:  Recent Labs  07/30/12 1648  TROPONINI <0.30   BNP:  Recent Labs  07/30/12 1648  PROBNP 4358.0*   D-Dimer: No results found for this basename: DDIMER,  in the last 72 hours CBG: No results found for this basename: GLUCAP,  in the last 72 hours Hemoglobin A1C: No results found for this basename: HGBA1C,  in the last 72 hours Fasting Lipid Panel: No results found for this basename: CHOL, HDL, LDLCALC, TRIG, CHOLHDL, LDLDIRECT,  in the last 72 hours Thyroid Function Tests: No results found for this basename: TSH, T4TOTAL, FREET4, T3FREE, THYROIDAB,  in the last 72 hours Anemia Panel: No results found for this basename: VITAMINB12, FOLATE, FERRITIN, TIBC, IRON, RETICCTPCT,  in the last 72 hours Coagulation:  Recent Labs  07/30/12 1648  LABPROT 13.2  INR 1.01   Urine Drug Screen: Drugs of Abuse     Component Value Date/Time   LABOPIA NONE DETECTED 07/30/2011 0452   COCAINSCRNUR NONE DETECTED 07/30/2011 0452   LABBENZ POSITIVE* 07/30/2011 0452   AMPHETMU NONE DETECTED 07/30/2011 0452   THCU NONE DETECTED 07/30/2011 0452   LABBARB NONE DETECTED 07/30/2011 0452    Alcohol Level: No results found for this basename: ETH,  in the last 72 hours Urinalysis: No results found for this basename: COLORURINE, APPERANCEUR, LABSPEC, PHURINE, GLUCOSEU, HGBUR, BILIRUBINUR, KETONESUR, PROTEINUR, UROBILINOGEN, NITRITE, LEUKOCYTESUR,  in the last 72 hours Misc. Labs:  ABGS  Recent Labs  07/30/12  1659  PHART 7.415  PO2ART 80.4  TCO2 23.1  HCO3 27.3*   CULTURES No results found for this or any previous visit (from the past 240 hour(s)). Studies/Results: Dg Chest Portable 1 View  07/30/2012  *RADIOLOGY REPORT*  Clinical Data: Chest pain  PORTABLE CHEST - 1 VIEW  Comparison: 02/10/2012  Findings: Background COPD/emphysema noted.  Mild cardiac enlargement but normal vascularity.  Negative for CHF, pneumonia, collapse, consolidation, effusion or pneumothorax.  Monitor leads overlie the chest.   Atherosclerosis of the aorta noted.  Trachea is midline.  IMPRESSION: COPD/emphysema.  No acute process.   Original Report Authenticated By: Judie Petit. Miles Costain, M.D.     Medications:  Prior to Admission:  Prescriptions prior to admission  Medication Sig Dispense Refill  . ALPRAZolam (XANAX) 0.5 MG tablet Take 1 tablet (0.5 mg total) by mouth 3 (three) times daily as needed for anxiety.  15 tablet  0  . amoxicillin-clavulanate (AUGMENTIN) 875-125 MG per tablet Take 1 tablet by mouth 2 (two) times daily.      . cetirizine (ZYRTEC) 10 MG tablet Take 10 mg by mouth daily as needed for allergies.      . cyclobenzaprine (FLEXERIL) 5 MG tablet Take 5 mg by mouth 2 (two) times daily as needed.       . gabapentin (NEURONTIN) 300 MG capsule Take 300 mg by mouth 2 (two) times daily.      . methylPREDNISolone (MEDROL DOSEPAK) 4 MG tablet Take by mouth. follow package directions      . omeprazole (PRILOSEC) 20 MG capsule Take 20 mg by mouth 2 (two) times daily.      . rosuvastatin (CRESTOR) 10 MG tablet Take 10 mg by mouth daily.      . traZODone (DESYREL) 100 MG tablet Take 50 mg by mouth at bedtime.      Marland Kitchen albuterol (PROVENTIL HFA;VENTOLIN HFA) 108 (90 BASE) MCG/ACT inhaler Inhale 1-2 puffs into the lungs every 6 (six) hours as needed for wheezing.  1 Inhaler  0  . albuterol (PROVENTIL) (2.5 MG/3ML) 0.083% nebulizer solution Take 3 mLs (2.5 mg total) by nebulization every 6 (six) hours as needed.  75 mL  1  . calcium carbonate (OS-CAL - DOSED IN MG OF ELEMENTAL CALCIUM) 1250 MG tablet Take 1 tablet by mouth daily.      . Fluticasone-Salmeterol (ADVAIR) 250-50 MCG/DOSE AEPB Inhale 1 puff into the lungs every 12 (twelve) hours.  60 each  1  . oxybutynin (DITROPAN-XL) 5 MG 24 hr tablet Take 5 mg by mouth daily.      Marland Kitchen PARoxetine (PAXIL) 30 MG tablet Take 1 tablet (30 mg total) by mouth every morning.  30 tablet  1  . polyethylene glycol powder (GLYCOLAX/MIRALAX) powder Take 17 g by mouth daily.  255 g  1  .  roflumilast (DALIRESP) 500 MCG TABS tablet Take 1 tablet (500 mcg total) by mouth daily.  30 tablet  1  . tiotropium (SPIRIVA) 18 MCG inhalation capsule Place 1 capsule (18 mcg total) into inhaler and inhale daily.  30 capsule  1   Scheduled: . albuterol  2.5 mg Nebulization Q6H  . enoxaparin (LOVENOX) injection  40 mg Subcutaneous Q24H  . gabapentin  300 mg Oral BID  . methylPREDNISolone (SOLU-MEDROL) injection  125 mg Intravenous Q6H  . mometasone-formoterol  2 puff Inhalation BID  . oxybutynin  5 mg Oral Daily  . pantoprazole  80 mg Oral Daily  . PARoxetine  30 mg Oral Daily  . polyethylene glycol  17 g Oral Daily  . roflumilast  500 mcg Oral Daily  . sodium chloride  3 mL Intravenous Q12H  . tiotropium  18 mcg Inhalation Daily  . traZODone  50 mg Oral QHS   Continuous: . sodium chloride 50 mL/hr at 07/30/12 2157   ZOX:[id number], acetaminophen, albuterol, ALPRAZolam, alum & mag hydroxide-simeth, LORazepam, ondansetron (ZOFRAN) IV, ondansetron, oxyCODONE-acetaminophen  Assesment: She was admitted with COPD exacerbation. She is also complaining of severe abdominal discomfort and she says that seems to produce her problems with COPD. She has anxiety and depression and apparently has not been taking her medication. She has tobacco use disorder but he is reducing her tobacco use. Active Problems:   * No active hospital problems. *    Plan: I would like GI to see her. Continue with her current medications because she has improved    LOS: 1 day   Elic Vencill L 07/31/2012, 8:49 AM

## 2012-07-31 NOTE — Progress Notes (Signed)
UR Chart Review Completed  

## 2012-07-31 NOTE — Care Management Note (Signed)
    Page 1 of 1   08/02/2012     11:26:57 AM   CARE MANAGEMENT NOTE 08/02/2012  Patient:  Kristin Wells, Kristin Wells   Account Number:  [id number]  Date Initiated:  07/31/2012  Documentation initiated by:  Sharrie Rothman  Subjective/Objective Assessment:   Pt admitted from home with COPD. Pt lives in senior citizen apartments and has a CAP aide 2 hours a day, M-F with [facility]. Pt is also [facility] by [facility]. Pt has O2, neb machine, cane, walker for home use.     Action/Plan:   Will contact [facility] about admission. Will continue to follow for any HH needs.   Anticipated DC Date:  08/02/2012   Anticipated DC Plan:  HOME/SELF CARE      DC Planning Services  CM consult      Choice offered to / List presented to:             Status of service:  Completed, signed off Medicare Important Message given?  YES (If response is "NO", the following Medicare IM given date fields will be blank) Date Medicare IM given:  08/02/2012 Date Additional Medicare IM given:    Discharge Disposition:  HOME/SELF CARE  Per UR Regulation:    If discussed at Long Length of Stay Meetings, dates discussed:    Comments:  08/02/12 1125 Arlyss Queen, RN BSN CM Pt discharged home today. Marja Kays of [facility] made aware of discharge so that they can resume follow up. Pts CAP aide will pick pt up. Pt to monitor blood sugars at home, she already has glucometer for home use. No CM or DME needs noted.  07/31/12 1115 Arlyss Queen, RN BSN CM

## 2012-07-31 NOTE — Consult Note (Addendum)
SX MOST CONSISTENT WITH FUNCTIONAL GUT DISORDER. DIFFERENTIAL DIAGNOSIS INCLUDES SMALL INTESTINE BACTERIAL OVERGROWTH. PT HAS BEEN SEEN AT [facility] IN [place] (DR. Bosie Clos). PROBIOTIC DAILY. CONSIDER HYDROGEN BREATH TEST AS OP. AVOID DIARY. STOP MIRALAX. USE DULCOLAX AS NEEDED FOR CONSTIPATION.  REVIEWED.

## 2012-07-31 NOTE — H&P (Signed)
Kristin Wells, Kristin Wells NO.:  [id number]  MEDICAL RECORD NO.:  [id number]  LOCATION:  A322                          FACILITY:  [facility]  PHYSICIAN:  Kingsley Callander. Ouida Sills, MD       DATE OF BIRTH:  07-Aug-1950  DATE OF ADMISSION:  07/30/2012 DATE OF DISCHARGE:  LH                             HISTORY & PHYSICAL   CHIEF COMPLAINT:  Difficulty breathing.  HISTORY OF PRESENT ILLNESS:  This patient is a 62 year old white female patient of Dr. Juanetta Gosling who presented to the emergency room with increasing difficulty breathing which she attributed to difficulties with a hernia in her abdomen.  She uses home oxygen and home nebulizers. She was evidently in significant respiratory distress on admission to the emergency room and was treated with BiPAP prior to my arrival.  She had subsequently been weaned from it and was actually breathing relatively comfortably at the time of my arrival.  She described wheezing and difficulty catching her breath.  She had become quite anxious she states.  Her evaluation had revealed no evidence of pneumonia.  She had a normal white count.  She was hypertensive.  PAST MEDICAL HISTORY: 1. COPD. 2. Coronary artery disease. 3. Hypertension. 4. Pneumonia. 5. Back pain. 6. Anxiety.  MEDICATIONS: 1. Xanax 0.5 mg t.i.d. 2. Flexeril 5 mg b.i.d. 3. Neurontin 300 mg b.i.d. 4. Prilosec 20 mg b.i.d. 5. Crestor 10 mg daily. 6. Trazodone 50 mg daily. 7. Albuterol p.r.n. 8. Albuterol nebs q.6. 9. Os-Cal daily. 10.Advair 250 b.i.d. 11.Ditropan XL 5 mg daily. 12.Paxil 30 mg daily. 13.MiraLax 17 g daily. 14.Daliresp 500 mcg daily. 15.Spiriva 1 capsule daily.  ALLERGIES:  None.  SOCIAL HISTORY:  She does not smoke, drink, or use drugs.  FAMILY HISTORY:  Her mother has diabetes.  Father's history is unknown.  REVIEW OF SYSTEMS:  No fever, chills, purulent sputum production, chest pain, abdominal pain, or vomiting.  PHYSICAL EXAMINATION:  VITAL SIGNS:   Temperature 97.5, pulse 110 but tachycardic in the low 100s, respirations 22, blood pressure 160/80, oxygen saturation 95% on supplemental oxygen. GENERAL:  Alert and in no distress at the time of my exam. HEENT:  Eyes, nose, and oropharynx are unremarkable. NECK:  No JVD or thyromegaly. LUNGS:  Mild wheezes. HEART:  Regular. ABDOMEN:  Soft and nontender with no palpable organomegaly. EXTREMITIES:  No cyanosis, clubbing, or edema. NEURO:  No focal weakness. LYMPH NODES:  No cervical or supraclavicular enlargement.  LABORATORY DATA:  Her ABG revealed a pH of 7.41, pCO2 43, and a pO2 of 80.  Sodium 140, potassium 3.6, BUN 5, creatinine 0.81, calcium 9.7, glucose 101, proBNP 4358.  Troponin I less than 0.30.  White count 9.2, hemoglobin 16.3, platelets 207,000.  IMAGING:  Chest x-ray reveals COPD with no acute infiltrate.  EKG reveals sinus tachycardia at 116 beats per minute with left axis deviation, and nonspecific ST-T wave changes.  IMPRESSION/PLAN: 1. Chronic obstructive pulmonary disease.  She has responded well to     initial therapy.  She will be continued on nebulizer treatments,     supplemental oxygen, and IV Solu-Medrol.  Further treatment will be     addressed by her  physician/pulmonologist, Dr. Juanetta Gosling, tomorrow. 2. History of hiatal hernia. 3. Anxiety. 4. Hyperlipidemia. 5. Gastroesophageal reflux disease.     Kingsley Callander. Ouida Sills, MD     ROF/MEDQ  D:  07/31/2012  T:  07/31/2012  Job:  [id number]

## 2012-07-31 NOTE — Consult Note (Signed)
Referring Provider: Dr. Juanetta Gosling Primary Care Physician:  Dorrene German, MD Primary Gastroenterologist:  Unclear who patient has seen in past.   Date of Admission: 07/30/12 Date of Consultation: 07/31/12  Reason for Consultation:  Abdominal bloating, abdominal pain   HPI:  62 year old female presenting with COPD exacerbation, noted to have abdominal bloating and intermittent abdominal pain. She feels that her abdominal issues exacerbate COPD. Reportedly, she tells me she has a large hiatal hernia and has been evaluated in [place] by a GI practice. However, she is unable to tell me the name.  She feels like she is 10 months pregnant. Sharp pain in lower abdomen, shoots up to epigastric area, cuts off breath when she tries to breathe. Notes fatty foods, hamburgers cause bloating and pain. When she gets a panic attack, pain starts lower abdomen, shoots up to epigastric region. Scared to eat due to pain. +N/V. +reflux. Just burps it up. Takes Prilosec 20 mg BID. Abdominal bloating about a year. No weight loss. Denies pain currently. Last EGD/TCS several months ago per patient. Done in an office. CT done recently in the office. No changes in bowel habits. No relief with defecation. Poor historian.   BPE Jan 2013 without evidence of hiatal hernia, no reflux. Barium pill passed without delay. [place] of abdomen Nov 2013 without abnormalities. I do not see where she had a CT abd/pelvis through our system.   Past Medical History  Diagnosis Date  . COPD (chronic obstructive pulmonary disease)   . Asthma   . Bronchitis   . Anxiety   . Back pain   . Demand ischemia of myocardium 06/28/2011  . Thyroid enlargement 06/28/2011  . MI, acute, non ST segment elevation 06/29/2011    Myoview stress test revealed mild perfusion defect  . Ischemic cardiomyopathy 06/28/2011    EF 25%. Myoview stress test later showed ejection fraction of 65%  . PNA (pneumonia) 06/29/2011  . Acute respiratory failure 06/2011    Vent  dependent sec to COPD/PNA  . Hypertension   . Coronary artery disease   . Myocardial infarction   . Pneumonia     Past Surgical History  Procedure Laterality Date  . Abdominal hysterectomy      Prior to Admission medications   Medication Sig Start Date End Date Taking? Authorizing Provider  ALPRAZolam [name]) 0.5 MG tablet Take 1 tablet (0.5 mg total) by mouth 3 (three) times daily as needed for anxiety. 08/02/11  Yes Christiane Ha, MD  amoxicillin-clavulanate (AUGMENTIN) 875-125 MG per tablet Take 1 tablet by mouth 2 (two) times daily. 07/30/12 08/08/12 Yes Historical Provider, MD  cetirizine (ZYRTEC) 10 MG tablet Take 10 mg by mouth daily as needed for allergies.   Yes Historical Provider, MD  cyclobenzaprine (FLEXERIL) 5 MG tablet Take 5 mg by mouth 2 (two) times daily as needed.    Yes Historical Provider, MD  gabapentin (NEURONTIN) 300 MG capsule Take 300 mg by mouth 2 (two) times daily. 07/14/11  Yes Erick Blinks, MD  methylPREDNISolone (MEDROL DOSEPAK) 4 MG tablet Take by mouth. follow package directions 07/30/12 08/05/12 Yes Historical Provider, MD  omeprazole (PRILOSEC) 20 MG capsule Take 20 mg by mouth 2 (two) times daily.   Yes Historical Provider, MD  rosuvastatin (CRESTOR) 10 MG tablet Take 10 mg by mouth daily.   Yes Historical Provider, MD  traZODone (DESYREL) 100 MG tablet Take 50 mg by mouth at bedtime.   Yes Historical Provider, MD  albuterol (PROVENTIL HFA;VENTOLIN HFA) 108 (90 BASE) MCG/ACT inhaler  Inhale 1-2 puffs into the lungs every 6 (six) hours as needed for wheezing. 07/14/11 07/13/12  Erick Blinks, MD  albuterol (PROVENTIL) (2.5 MG/3ML) 0.083% nebulizer solution Take 3 mLs (2.5 mg total) by nebulization every 6 (six) hours as needed. 07/14/11   Erick Blinks, MD  calcium carbonate (OS-CAL - DOSED IN MG OF ELEMENTAL CALCIUM) 1250 MG tablet Take 1 tablet by mouth daily.    Historical Provider, MD  Fluticasone-Salmeterol (ADVAIR) 250-50 MCG/DOSE AEPB Inhale 1 puff into  the lungs every 12 (twelve) hours. 07/14/11   Erick Blinks, MD  oxybutynin (DITROPAN-XL) 5 MG 24 hr tablet Take 5 mg by mouth daily.    Historical Provider, MD  PARoxetine (PAXIL) 30 MG tablet Take 1 tablet (30 mg total) by mouth every morning. 07/14/11   Erick Blinks, MD  polyethylene glycol powder (GLYCOLAX/MIRALAX) powder Take 17 g by mouth daily. 07/14/11   Erick Blinks, MD  roflumilast (DALIRESP) 500 MCG TABS tablet Take 1 tablet (500 mcg total) by mouth daily. 07/14/11   Erick Blinks, MD  tiotropium (SPIRIVA) 18 MCG inhalation capsule Place 1 capsule (18 mcg total) into inhaler and inhale daily. 07/14/11   Erick Blinks, MD    Current Facility-Administered Medications  Medication Dose Route Frequency Provider Last Rate Last Dose  . 0.9 %  sodium chloride infusion   Intravenous Continuous Carylon Perches, MD 50 mL/hr at 07/30/12 2157    . acetaminophen (TYLENOL) tablet 650 mg  650 mg Oral Q6H PRN Carylon Perches, MD       Or  . acetaminophen (TYLENOL) suppository 650 mg  650 mg Rectal Q6H PRN Carylon Perches, MD      . albuterol (PROVENTIL HFA;VENTOLIN HFA) 108 (90 BASE) MCG/ACT inhaler 1-2 puff  1-2 puff Inhalation Q6H PRN Carylon Perches, MD      . albuterol (PROVENTIL) (5 MG/ML) 0.5% nebulizer solution 2.5 mg  2.5 mg Nebulization Q6H Carylon Perches, MD   2.5 mg at 07/31/12 0654  . ALPRAZolam [name]) tablet 0.5 mg  0.5 mg Oral TID PRN Carylon Perches, MD   0.5 mg at 07/30/12 1914  . alum & mag hydroxide-simeth (MAALOX/MYLANTA) 200-200-20 MG/5ML suspension 30 mL  30 mL Oral Q6H PRN Carylon Perches, MD      . enoxaparin (LOVENOX) injection 40 mg  40 mg Subcutaneous Q24H Carylon Perches, MD   40 mg at 07/30/12 1914  . gabapentin (NEURONTIN) capsule 300 mg  300 mg Oral BID Carylon Perches, MD   300 mg at 07/30/12 2227  . LORazepam (ATIVAN) injection 1 mg  1 mg Intravenous Q4H PRN Carylon Perches, MD   1 mg at 07/30/12 2225  . methylPREDNISolone sodium succinate (SOLU-MEDROL) 125 mg/2 mL injection 125 mg  125 mg Intravenous Q6H Carylon Perches, MD   125 mg  at 07/31/12 0416  . mometasone-formoterol (DULERA) 100-5 MCG/ACT inhaler 2 puff  2 puff Inhalation BID Carylon Perches, MD      . ondansetron [facility]) tablet 4 mg  4 mg Oral Q6H PRN Carylon Perches, MD       Or  . ondansetron [facility]) injection 4 mg  4 mg Intravenous Q6H PRN Carylon Perches, MD      . oxybutynin (DITROPAN-XL) 24 hr tablet 5 mg  5 mg Oral Daily Carylon Perches, MD      . oxyCODONE-acetaminophen (PERCOCET/ROXICET) 5-325 MG per tablet 0.5 tablet  0.5 tablet Oral Q4H PRN Carylon Perches, MD   0.5 tablet at 07/30/12 2228  . pantoprazole (PROTONIX) EC tablet 80 mg  80  mg Oral Daily Carylon Perches, MD      . PARoxetine (PAXIL) tablet 30 mg  30 mg Oral Daily Carylon Perches, MD      . polyethylene glycol (MIRALAX / GLYCOLAX) packet 17 g  17 g Oral Daily Fredirick Maudlin, MD      . roflumilast (DALIRESP) tablet 500 mcg  500 mcg Oral Daily Carylon Perches, MD      . sodium chloride 0.9 % injection 3 mL  3 mL Intravenous Q12H Carylon Perches, MD   3 mL at 07/30/12 2157  . tiotropium (SPIRIVA) inhalation capsule 18 mcg  18 mcg Inhalation Daily Carylon Perches, MD      . traZODone (DESYREL) tablet 50 mg  50 mg Oral QHS Carylon Perches, MD   50 mg at 07/30/12 2227    Allergies as of 07/30/2012  . (No Known Allergies)    Family History  Problem Relation Age of Onset  . Diabetes Mother   . Diabetes Father   . Hypertension Mother   . Hypertension Father     History   Social History  . Marital Status: Single    Spouse Name: N/A    Number of Children: N/A  . Years of Education: N/A   Occupational History  . Not on file.   Social History Main Topics  . Smoking status: Former Smoker    Types: Cigarettes    Quit date: 03/01/2012  . Smokeless tobacco: Not on file  . Alcohol Use: No  . Drug Use: No  . Sexually Active: Not on file   Other Topics Concern  . Not on file   Social History Narrative  . No narrative on file    Review of Systems: Gen: see HPI CV: Denies chest pain, heart palpitations, syncope, edema  Resp: +SOB,  cough GI: SEE HPI GU : Denies urinary burning, urinary frequency, urinary incontinence.  MS: Denies joint pain,swelling, cramping Derm: Denies rash, itching, dry skin Psych: +anxiety Heme: Denies bruising, bleeding, and enlarged lymph nodes.  Physical Exam: Vital signs in last 24 hours: Temp:  [97.4 F (36.3 C)-97.7 F (36.5 C)] 97.5 F (36.4 C) (03/25 0612) Pulse Rate:  [85-123] 85 (03/25 0612) Resp:  [20-40] 22 (03/25 0612) BP: (99-206)/(65-125) 160/80 mmHg (03/25 0612) SpO2:  [84 %-98 %] 95 % (03/25 0655) FiO2 (%):  [28 %] 28 % (03/24 2034) Weight:  [139 lb 1.8 oz (63.1 kg)-140 lb (63.504 kg)] 139 lb 15.9 oz (63.5 kg) (03/25 0612) Last BM Date: 07/28/12 General:   Alert,  Well-developed, well-nourished, pleasant and cooperative in NAD Head:  Normocephalic and atraumatic. Eyes:  Sclera clear, no icterus.   Conjunctiva pink. Ears:  Normal auditory acuity. Nose:  No deformity, discharge,  or lesions. Mouth:  No deformity or lesions Neck:  Supple; no masses or thyromegaly. Lungs:  Scattered rhonchi bilaterally Heart:  Regular rate and rhythm; no murmurs, clicks, rubs,  or gallops. Abdomen:  Soft, obese, slight distension but non-tense. +BS, TTP epigastric region.  Rectal:  Deferred  Msk:  Symmetrical without gross deformities. Normal posture. Extremities:  Without clubbing or edema. Neurologic:  Alert and  oriented x4;  grossly normal neurologically. Skin:  Intact without significant lesions or rashes. Cervical Nodes:  No significant cervical adenopathy. Psych:  Alert and cooperative. Normal mood and affect.  Intake/Output from previous day: 03/24 0701 - 03/25 0700 In: 525.8 [I.V.:525.8] Out: -  Intake/Output this shift:    Lab Results:  Recent Labs  07/30/12 1648  WBC 9.2  HGB 16.3*  HCT 49.2*  PLT 207   BMET  Recent Labs  07/30/12 1648  NA 140  K 3.6  CL 98  CO2 30  GLUCOSE 101*  BUN 5*  CREATININE 0.81  CALCIUM 9.7   PT/INR  Recent Labs   07/30/12 1648  LABPROT 13.2  INR 1.01    Studies/Results: Dg Chest Portable 1 View  07/30/2012  *RADIOLOGY REPORT*  Clinical Data: Chest pain  PORTABLE CHEST - 1 VIEW  Comparison: 02/10/2012  Findings: Background COPD/emphysema noted.  Mild cardiac enlargement but normal vascularity.  Negative for CHF, pneumonia, collapse, consolidation, effusion or pneumothorax.  Monitor leads overlie the chest.  Atherosclerosis of the aorta noted.  Trachea is midline.  IMPRESSION: COPD/emphysema.  No acute process.   Original Report Authenticated By: Judie Petit. Miles Costain, M.D.     Impression: 62 year old female admitted with COPD exacerbation, complaining of intermittent abdominal pain and bloating for approximately one year. Appears to worsen with certain types of food, but she also reports anxiety causes flares. Associated nausea and vomiting. No association with bowel habits.  She tells me she has a large hiatal hernia, but I do not have any records from her recent GI work-up in [place]. Apparently, she has had a colonoscopy, upper endoscopy, CT scan, and much more per patient in just the past few months. No evidence of hiatal hernia on BPE in Jan 2013, [place] of abdomen Nov 2013 without abnormalities.  Differentials at this point are vast, and it is encouraging she has had recent upper and lower GI evaluations (reportedly). Would consider CT abd/pelvis if none completed recently. Possible work-up, without knowing what has been done yet, could include celiac serologies if not on file, evaluation of mesenteric vasculature to exclude mesenteric ischemia (if not already done), possible HIDA to evaluate for biliary etiology. Symptoms are chronic, and she is a poor historian. Will need to obtain records, which our office is working on currently.   Plan: PPI BID Consider CT if not already done at outside facility Review records when available Further recommendations in near future  Nira Retort, ANP-BC [facility]  Gastroenterology     LOS: 1 day    07/31/2012, 9:37 AM   Addendum: Reviewed notes. Apparently, patient has been seen by Dr. Charlott Rakes in the past. Underwent EGD in Feb 2013 with findings of reactive gastropathy, candida esophagitis per path, normal duodenum. TCS at that time with internal hemorrhoids, multiple hyperplastic polyps. She saw Dr. Bosie Clos again in Dec 2013, with a weight of 153. Current weight while inpatient is 139. She was supposed to undergo CT abd/pelvis, ordered by Dr. Bosie Clos. We have attempted to retrieve this, but [facility] states patient cancelled the appointment. While she is inpatient, would recommend proceeding with a CT abd/pelvis. Add celiac serologies for completeness' sake. Further recommendations once CT completed.

## 2012-08-01 LAB — GLUCOSE, CAPILLARY: Glucose-Capillary: 227 mg/dL — ABNORMAL HIGH (ref 70–99)

## 2012-08-01 LAB — TISSUE TRANSGLUTAMINASE, IGA: Tissue Transglutaminase Ab, IgA: 5.4 U/mL (ref <20)

## 2012-08-01 LAB — IGA: IgA: 327 mg/dL (ref 69–380)

## 2012-08-01 MED ORDER — PREDNISONE 20 MG PO TABS
50.0000 mg | ORAL_TABLET | Freq: Every day | ORAL | Status: DC
  Administered 2012-08-01 – 2012-08-02 (×2): 50 mg via ORAL
  Filled 2012-08-01: qty 2
  Filled 2012-08-01: qty 1

## 2012-08-01 NOTE — Progress Notes (Addendum)
REVIEWED. AGREE. I personally reviewed the CT A/P with Dr. Kearney Hard. Pt has a very small umbilical hernia otherwise, no mass or large ventral hernia present.

## 2012-08-01 NOTE — Progress Notes (Signed)
Subjective: Patient states "they haven't figured out what is wrong". Eating breakfast. States she has "40 lbs of mess" in her abdomen. Thinks there is a 40 lb virus in her stomach, cutting off her breath, pressing on her heart, might cause a heart attack.   Objective: Vital signs in last 24 hours: Temp:  [97.9 F (36.6 C)-98 F (36.7 C)] 97.9 F (36.6 C) (03/26 0510) Pulse Rate:  [89-105] 89 (03/26 0746) Resp:  [18-22] 18 (03/26 0746) BP: (95-125)/(56-68) 95/57 mmHg (03/26 0510) SpO2:  [90 %-98 %] 98 % (03/26 0746) Weight:  [144 lb 10 oz (65.6 kg)] 144 lb 10 oz (65.6 kg) (03/26 0510) Last BM Date: 07/28/12 General:   Alert and oriented, pleasant Head:  Normocephalic and atraumatic. Eyes:  No icterus, sclera clear. Conjuctiva pink.  Mouth:  Without lesions, mucosa pink and moist.  Heart:  S1, S2 present, no murmurs noted.  Lungs: scattered rhonchi bilaterally Abdomen:  Bowel sounds present, soft, obese, appears distended but non-tense. No tenderness to palpation Msk:  Symmetrical without gross deformities. Normal posture. Neurologic:  Alert and  oriented x4;  grossly normal neurologically. Skin:  Warm and dry, intact without significant lesions.  Psych:  Anxious about GI issues  Intake/Output from previous day: 03/25 0701 - 03/26 0700 In: 2368.3 [P.O.:600; I.V.:1768.3] Out: 400 [Urine:400] Intake/Output this shift:    Lab Results:  Recent Labs  07/30/12 1648  WBC 9.2  HGB 16.3*  HCT 49.2*  PLT 207   BMET  Recent Labs  07/30/12 1648  NA 140  K 3.6  CL 98  CO2 30  GLUCOSE 101*  BUN 5*  CREATININE 0.81  CALCIUM 9.7   PT/INR  Recent Labs  07/30/12 1648  LABPROT 13.2  INR 1.01    Studies/Results: Ct Abdomen Pelvis W Contrast  07/31/2012  *RADIOLOGY REPORT*  Clinical Data: The abdominal pain and bloating.  CT ABDOMEN AND PELVIS WITH CONTRAST  Technique:  Multidetector CT imaging of the abdomen and pelvis was performed following the standard protocol  during bolus administration of intravenous contrast.  Contrast: 50mL OMNIPAQUE IOHEXOL 300 MG/ML  SOLN, OMNIPAQUE IOHEXOL 300 MG/ML  SOLN  Comparison: None.  Findings: The lung bases are clear except for minimal streaky subsegmental atelectasis.  The heart is normal in size.  No pericardial effusion.  The distal esophagus is unremarkable.  The liver is unremarkable.  No focal hepatic lesions or intrahepatic ductal dilatation.  The gallbladder is normal except for a possible tiny calcified gallstone.  No common bile duct dilatation.  The pancreas is normal.  The spleen is normal.  The adrenal glands and kidneys are normal.  Minimal scarring changes are noted.  No renal calculi or hydronephrosis.  The stomach, duodenum, small bowel and colon are unremarkable.  No inflammatory changes or mass lesions.  No obstruction.  The appendix is normal.  No mesenteric or retroperitoneal mass or adenopathy.  Small scattered lymph nodes are noted.  The aorta demonstrates scattered atherosclerotic calcifications but no focal aneurysm or dissection.  The major branch vessels are patent.  The uterus and ovaries are normal.  The bladder is mildly distended.  No bladder wall thickening or bladder calculi.  No pelvic mass, adenopathy or free pelvic fluid collections.  No inguinal mass or hernia.  The bony structures are intact.  The severe degenerative disc disease is again noted at L1-2 with discogenic sclerosis.  There is also a stable hemangioma in the L1 vertebral body.  The bony pelvis is intact.  Mild hip joint degenerative changes.  IMPRESSION:  1.  No acute abdominal/pelvic findings, mass lesions or adenopathy. 2.  Minimal bilateral renal scarring changes. 3.  Scattered atherosclerotic changes involving the aorta and iliac vessels.   Original Report Authenticated By: Rudie Meyer, M.D.    Dg Chest Portable 1 View  07/30/2012  *RADIOLOGY REPORT*  Clinical Data: Chest pain  PORTABLE CHEST - 1 VIEW  Comparison: 02/10/2012   Findings: Background COPD/emphysema noted.  Mild cardiac enlargement but normal vascularity.  Negative for CHF, pneumonia, collapse, consolidation, effusion or pneumothorax.  Monitor leads overlie the chest.  Atherosclerosis of the aorta noted.  Trachea is midline.  IMPRESSION: COPD/emphysema.  No acute process.   Original Report Authenticated By: Judie Petit. Miles Costain, M.D.     Assessment: 62 year old female admitted with COPD exacerbation, noted to have chronic abdominal pain and bloating. Prior work-up as noted in consult by Dr. Charlott Rakes in [place]. Celiac serologies pending, CT this admission without acute findings. Gallbladder remains in situ. It is interesting that she notes worsening of symptoms with "foods I shouldn't eat". Upon examination this morning, she is eating breakfast without difficulty, drinking milk, but still complaining of symptoms. Symptoms most likely consistent with functional gut disorder, unable to exclude small bowel bacterial overgrowth. Biliary etiology less likely but unable to exclude. Patient seems quite focused on abdominal issues.        Plan: Avoid constipation: use dulcolax prn Outpatient follow-up with Dr. Bosie Clos for consideration of hydrogen breath test and further work-up Avoid dairy (will modify diet) PPI daily   LOS: 2 days    08/01/2012, 8:00 AM

## 2012-08-01 NOTE — Progress Notes (Signed)
Subjective: She feels better. She still has some abdominal discomfort and she is still short of breath. She's coughing a little bit. She still has limited understanding of her abdominal problem  Objective: Vital signs in last 24 hours: Temp:  [97.9 F (36.6 C)-98 F (36.7 C)] 97.9 F (36.6 C) (03/26 0510) Pulse Rate:  [89-105] 89 (03/26 0746) Resp:  [18-22] 18 (03/26 0746) BP: (95-125)/(56-68) 95/57 mmHg (03/26 0510) SpO2:  [90 %-98 %] 98 % (03/26 0746) Weight:  [65.6 kg (144 lb 10 oz)] 65.6 kg (144 lb 10 oz) (03/26 0510) Weight change: 2.096 kg (4 lb 10 oz) Last BM Date: 07/28/12  Intake/Output from previous day: 03/25 0701 - 03/26 0700 In: 2368.3 [P.O.:600; I.V.:1768.3] Out: 400 [Urine:400]  PHYSICAL EXAM General appearance: alert, cooperative and mild distress Resp: rhonchi bilaterally Cardio: regular rate and rhythm, S1, S2 normal, no murmur, click, rub or gallop GI: soft, non-tender; bowel sounds normal; no masses,  no organomegaly Extremities: extremities normal, atraumatic, no cyanosis or edema  Lab Results:    Basic Metabolic Panel:  Recent Labs  16/10/96 1648  NA 140  K 3.6  CL 98  CO2 30  GLUCOSE 101*  BUN 5*  CREATININE 0.81  CALCIUM 9.7   Liver Function Tests: No results found for this basename: AST, ALT, ALKPHOS, BILITOT, PROT, ALBUMIN,  in the last 72 hours No results found for this basename: LIPASE, AMYLASE,  in the last 72 hours No results found for this basename: AMMONIA,  in the last 72 hours CBC:  Recent Labs  07/30/12 1648  WBC 9.2  NEUTROABS 5.3  HGB 16.3*  HCT 49.2*  MCV 85.0  PLT 207   Cardiac Enzymes:  Recent Labs  07/30/12 1648  TROPONINI <0.30   BNP:  Recent Labs  07/30/12 1648  PROBNP 4358.0*   D-Dimer: No results found for this basename: DDIMER,  in the last 72 hours CBG: No results found for this basename: GLUCAP,  in the last 72 hours Hemoglobin A1C: No results found for this basename: HGBA1C,  in the last  72 hours Fasting Lipid Panel: No results found for this basename: CHOL, HDL, LDLCALC, TRIG, CHOLHDL, LDLDIRECT,  in the last 72 hours Thyroid Function Tests: No results found for this basename: TSH, T4TOTAL, FREET4, T3FREE, THYROIDAB,  in the last 72 hours Anemia Panel: No results found for this basename: VITAMINB12, FOLATE, FERRITIN, TIBC, IRON, RETICCTPCT,  in the last 72 hours Coagulation:  Recent Labs  07/30/12 1648  LABPROT 13.2  INR 1.01   Urine Drug Screen: Drugs of Abuse     Component Value Date/Time   LABOPIA NONE DETECTED 07/30/2011 0452   COCAINSCRNUR NONE DETECTED 07/30/2011 0452   LABBENZ POSITIVE* 07/30/2011 0452   AMPHETMU NONE DETECTED 07/30/2011 0452   THCU NONE DETECTED 07/30/2011 0452   LABBARB NONE DETECTED 07/30/2011 0452    Alcohol Level: No results found for this basename: ETH,  in the last 72 hours Urinalysis: No results found for this basename: COLORURINE, APPERANCEUR, LABSPEC, PHURINE, GLUCOSEU, HGBUR, BILIRUBINUR, KETONESUR, PROTEINUR, UROBILINOGEN, NITRITE, LEUKOCYTESUR,  in the last 72 hours Misc. Labs:  ABGS  Recent Labs  07/30/12 1659  PHART 7.415  PO2ART 80.4  TCO2 23.1  HCO3 27.3*   CULTURES No results found for this or any previous visit (from the past 240 hour(s)). Studies/Results: Ct Abdomen Pelvis W Contrast  07/31/2012  *RADIOLOGY REPORT*  Clinical Data: The abdominal pain and bloating.  CT ABDOMEN AND PELVIS WITH CONTRAST  Technique:  Multidetector  CT imaging of the abdomen and pelvis was performed following the standard protocol during bolus administration of intravenous contrast.  Contrast: 50mL OMNIPAQUE IOHEXOL 300 MG/ML  SOLN, OMNIPAQUE IOHEXOL 300 MG/ML  SOLN  Comparison: None.  Findings: The lung bases are clear except for minimal streaky subsegmental atelectasis.  The heart is normal in size.  No pericardial effusion.  The distal esophagus is unremarkable.  The liver is unremarkable.  No focal hepatic lesions or intrahepatic  ductal dilatation.  The gallbladder is normal except for a possible tiny calcified gallstone.  No common bile duct dilatation.  The pancreas is normal.  The spleen is normal.  The adrenal glands and kidneys are normal.  Minimal scarring changes are noted.  No renal calculi or hydronephrosis.  The stomach, duodenum, small bowel and colon are unremarkable.  No inflammatory changes or mass lesions.  No obstruction.  The appendix is normal.  No mesenteric or retroperitoneal mass or adenopathy.  Small scattered lymph nodes are noted.  The aorta demonstrates scattered atherosclerotic calcifications but no focal aneurysm or dissection.  The major branch vessels are patent.  The uterus and ovaries are normal.  The bladder is mildly distended.  No bladder wall thickening or bladder calculi.  No pelvic mass, adenopathy or free pelvic fluid collections.  No inguinal mass or hernia.  The bony structures are intact.  The severe degenerative disc disease is again noted at L1-2 with discogenic sclerosis.  There is also a stable hemangioma in the L1 vertebral body.  The bony pelvis is intact.  Mild hip joint degenerative changes.  IMPRESSION:  1.  No acute abdominal/pelvic findings, mass lesions or adenopathy. 2.  Minimal bilateral renal scarring changes. 3.  Scattered atherosclerotic changes involving the aorta and iliac vessels.   Original Report Authenticated By: Rudie Meyer, M.D.    Dg Chest Portable 1 View  07/30/2012  *RADIOLOGY REPORT*  Clinical Data: Chest pain  PORTABLE CHEST - 1 VIEW  Comparison: 02/10/2012  Findings: Background COPD/emphysema noted.  Mild cardiac enlargement but normal vascularity.  Negative for CHF, pneumonia, collapse, consolidation, effusion or pneumothorax.  Monitor leads overlie the chest.  Atherosclerosis of the aorta noted.  Trachea is midline.  IMPRESSION: COPD/emphysema.  No acute process.   Original Report Authenticated By: Judie Petit. Miles Costain, M.D.     Medications:  Prior to Admission:   Prescriptions prior to admission  Medication Sig Dispense Refill  . albuterol (PROVENTIL) (2.5 MG/3ML) 0.083% nebulizer solution Take 3 mLs (2.5 mg total) by nebulization every 6 (six) hours as needed.  75 mL  1  . ALPRAZolam (XANAX) 0.5 MG tablet Take 1 tablet (0.5 mg total) by mouth 3 (three) times daily as needed for anxiety.  15 tablet  0  . amoxicillin-clavulanate (AUGMENTIN) 875-125 MG per tablet Take 1 tablet by mouth 2 (two) times daily.      . cetirizine (ZYRTEC) 10 MG tablet Take 10 mg by mouth daily as needed for allergies.      . cyclobenzaprine (FLEXERIL) 5 MG tablet Take 5 mg by mouth 2 (two) times daily as needed.       . gabapentin (NEURONTIN) 300 MG capsule Take 300 mg by mouth 2 (two) times daily.      . methylPREDNISolone (MEDROL DOSEPAK) 4 MG tablet Take by mouth. follow package directions      . omeprazole (PRILOSEC) 20 MG capsule Take 20 mg by mouth 2 (two) times daily.      Marland Kitchen oxyCODONE-acetaminophen (PERCOCET/ROXICET) 5-325 MG per tablet  Take 0.5 tablets by mouth 2 (two) times daily as needed for pain.      . rosuvastatin (CRESTOR) 10 MG tablet Take 10 mg by mouth daily.      Marland Kitchen tiotropium (SPIRIVA) 18 MCG inhalation capsule Place 1 capsule (18 mcg total) into inhaler and inhale daily.  30 capsule  1  . traZODone (DESYREL) 50 MG tablet Take 25 mg by mouth at bedtime.      . Vitamin D, Ergocalciferol, (DRISDOL) 50000 UNITS CAPS Take 50,000 Units by mouth every 7 (seven) days. Takes every Monday      . albuterol (PROVENTIL HFA;VENTOLIN HFA) 108 (90 BASE) MCG/ACT inhaler Inhale 1-2 puffs into the lungs every 6 (six) hours as needed for wheezing.  1 Inhaler  0  . calcium carbonate (OS-CAL - DOSED IN MG OF ELEMENTAL CALCIUM) 1250 MG tablet Take 1 tablet by mouth daily.      . Fluticasone-Salmeterol (ADVAIR) 250-50 MCG/DOSE AEPB Inhale 1 puff into the lungs every 12 (twelve) hours.  60 each  1  . PARoxetine (PAXIL) 30 MG tablet Take 1 tablet (30 mg total) by mouth every morning.   30 tablet  1  . polyethylene glycol powder (GLYCOLAX/MIRALAX) powder Take 17 g by mouth daily.  255 g  1   Scheduled: . acidophilus  1 capsule Oral Daily  . albuterol  2.5 mg Nebulization Q6H  . enoxaparin (LOVENOX) injection  40 mg Subcutaneous Q24H  . gabapentin  300 mg Oral BID  . mometasone-formoterol  2 puff Inhalation BID  . oxybutynin  5 mg Oral Daily  . pantoprazole  40 mg Oral BID AC  . PARoxetine  30 mg Oral Daily  . predniSONE  50 mg Oral Q breakfast  . roflumilast  500 mcg Oral Daily  . sodium chloride  3 mL Intravenous Q12H  . tiotropium  18 mcg Inhalation Daily  . traZODone  50 mg Oral QHS   Continuous: . sodium chloride 50 mL/hr at 07/31/12 1358   ZOX:[id number], acetaminophen, albuterol, ALPRAZolam, alum & mag hydroxide-simeth, bisacodyl, LORazepam, ondansetron (ZOFRAN) IV, ondansetron, oxyCODONE-acetaminophen  Assesment: She was admitted with COPD exacerbation and acute respiratory failure. She is markedly improved. She feels like her episodes of shortness of breath are related to bloating in her abdomen and she's felt to have a functional abdominal problem possibly a bacterial overgrowth. I told her she would need to be on a probiotic agents to see if we can make this better. CT was essentially nonrevealing Active Problems:   * No active hospital problems. *    Plan: Continue treatments. I will switch her to by mouth steroids. She's going to ambulate. I think she'll probably be    LOS: 2 days   Kristin Wells L 08/01/2012, 8:31 AM

## 2012-08-02 DIAGNOSIS — R109 Unspecified abdominal pain: Secondary | ICD-10-CM

## 2012-08-02 LAB — HEPATIC FUNCTION PANEL
ALT: 19 U/L (ref 0–35)
AST: 17 U/L (ref 0–37)
Albumin: 2.9 g/dL — ABNORMAL LOW (ref 3.5–5.2)
Alkaline Phosphatase: 65 U/L (ref 39–117)
Bilirubin, Direct: <0.1 mg/dL (ref 0.0–0.3)
Total Bilirubin: 0.2 mg/dL — ABNORMAL LOW (ref 0.3–1.2)
Total Protein: 5.6 g/dL — ABNORMAL LOW (ref 6.0–8.3)

## 2012-08-02 MED ORDER — RISAQUAD PO CAPS: 1.0000 | ORAL_CAPSULE | Freq: Every day | ORAL | Status: DC

## 2012-08-02 NOTE — Progress Notes (Signed)
Pt's discharge instructions were reviewed with pt. Pt verbalized understanding. Pt discharged to home, with CAP aide, in a safe and stable condition.

## 2012-08-02 NOTE — Progress Notes (Signed)
Patient woke up and called RN into room. Patient's IV had come out while she was sleeping. Patient has no IV meds and will probably be discharged. Dr. Felecia Shelling was called and asked if IV could be left out and he stated that it would be fine.
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
## 2012-08-02 NOTE — Progress Notes (Signed)
Inpatient Diabetes Program Recommendations  AACE/ADA: New Consensus Statement on Inpatient Glycemic Control (2013)  Target Ranges:  Prepandial:   less than 140 mg/dL      Peak postprandial:   less than 180 mg/dL (1-2 hours)      Critically ill patients:  140 - 180 mg/dL  Results for JANISSA, BERTRAM (MRN [id number]) as of 08/02/2012 08:12  Ref. Range 07/30/2012 16:48  Glucose Latest Range: 70-99 mg/dL 829 (H)   Results for LASHANE, WHELPLEY (MRN [id number]) as of 08/02/2012 08:12  Ref. Range 08/01/2012 14:31  Glucose-Capillary Latest Range: 70-99 mg/dL 784 (H)     Note: Patient has no documented history of diabetes.  Initial lab glucose on 3/24 @ 16:48 was 101 mg/dl.  Patient has been on steroids for respiratory issues as an inpatient.  Note that patient was on Solumedrol and was changed to Prednisone 50 mg QAM yesterday.  Blood glucose was checked 3/26 @ 14:31 and noted to be 227 mg/dl.  May want to consider ordering CBGs ACHS with Novolog correction scale while inpatient and on steroids.  Will continue to follow if necessary.  Thanks, Orlando Penner, RN, BSN, CCRN Diabetes Coordinator Inpatient Diabetes Program [phone]

## 2012-08-02 NOTE — Progress Notes (Signed)
She is better. She has no new complaints. The note from diabetes coronary is noted but she is going to be discharged and she will have glucose monitoring is an outpatient. Her breathing is much better. She looks very comfortable. She is ready for discharge

## 2012-08-03 NOTE — Discharge Summary (Signed)
Physician Discharge Summary  Patient ID: Kristin Wells MRN: [id number] DOB/AGE: 1951-04-30 62 y.o. Primary Care Physician:AVBUERE,EDWIN A, MD Admit date: 07/30/2012 Discharge date: 08/03/2012    Discharge Diagnoses:   Principal Problem:   Acute respiratory failure Active Problems:   DEPRESSION   COPD   HTN (hypertension)   COPD exacerbation   Chronic back pain   CAD (coronary artery disease)  abdominal pain    Medication List    TAKE these medications       acidophilus Caps  Take 1 capsule by mouth daily.     albuterol 108 (90 BASE) MCG/ACT inhaler  Commonly known as:  PROVENTIL HFA;VENTOLIN HFA  Inhale 1-2 puffs into the lungs every 6 (six) hours as needed for wheezing.     albuterol (2.5 MG/3ML) 0.083% nebulizer solution  Commonly known as:  PROVENTIL  Take 3 mLs (2.5 mg total) by nebulization every 6 (six) hours as needed.     ALPRAZolam 0.5 MG tablet  Commonly known as:  XANAX  Take 1 tablet (0.5 mg total) by mouth 3 (three) times daily as needed for anxiety.     amoxicillin-clavulanate 875-125 MG per tablet  Commonly known as:  AUGMENTIN  Take 1 tablet by mouth 2 (two) times daily.     calcium carbonate 1250 MG tablet  Commonly known as:  OS-CAL - dosed in mg of elemental calcium  Take 1 tablet by mouth daily.     cetirizine 10 MG tablet  Commonly known as:  ZYRTEC  Take 10 mg by mouth daily as needed for allergies.     cyclobenzaprine 5 MG tablet  Commonly known as:  FLEXERIL  Take 5 mg by mouth 2 (two) times daily as needed.     Fluticasone-Salmeterol 250-50 MCG/DOSE Aepb  Commonly known as:  ADVAIR  Inhale 1 puff into the lungs every 12 (twelve) hours.     gabapentin 300 MG capsule  Commonly known as:  NEURONTIN  Take 300 mg by mouth 2 (two) times daily.     methylPREDNISolone 4 MG tablet  Commonly known as:  MEDROL DOSEPAK  Take by mouth. follow package directions     omeprazole 20 MG capsule  Commonly known as:  PRILOSEC  Take 20 mg by  mouth 2 (two) times daily.     oxyCODONE-acetaminophen 5-325 MG per tablet  Commonly known as:  PERCOCET/ROXICET  Take 0.5 tablets by mouth 2 (two) times daily as needed for pain.     PARoxetine 30 MG tablet  Commonly known as:  PAXIL  Take 1 tablet (30 mg total) by mouth every morning.     polyethylene glycol powder powder  Commonly known as:  GLYCOLAX/MIRALAX  Take 17 g by mouth daily.     rosuvastatin 10 MG tablet  Commonly known as:  CRESTOR  Take 10 mg by mouth daily.     tiotropium 18 MCG inhalation capsule  Commonly known as:  SPIRIVA  Place 1 capsule (18 mcg total) into inhaler and inhale daily.     traZODone 50 MG tablet  Commonly known as:  DESYREL  Take 25 mg by mouth at bedtime.     Vitamin D (Ergocalciferol) 50000 UNITS Caps  Commonly known as:  DRISDOL  Take 50,000 Units by mouth every 7 (seven) days. Takes every Monday        Discharged Condition: Improved    Consults: Gastroenterology  Significant Diagnostic Studies: Ct Abdomen Pelvis W Contrast  07/31/2012  *RADIOLOGY REPORT*  Clinical Data: The abdominal  pain and bloating.  CT ABDOMEN AND PELVIS WITH CONTRAST  Technique:  Multidetector CT imaging of the abdomen and pelvis was performed following the standard protocol during bolus administration of intravenous contrast.  Contrast: 50mL OMNIPAQUE IOHEXOL 300 MG/ML  SOLN, OMNIPAQUE IOHEXOL 300 MG/ML  SOLN  Comparison: None.  Findings: The lung bases are clear except for minimal streaky subsegmental atelectasis.  The heart is normal in size.  No pericardial effusion.  The distal esophagus is unremarkable.  The liver is unremarkable.  No focal hepatic lesions or intrahepatic ductal dilatation.  The gallbladder is normal except for a possible tiny calcified gallstone.  No common bile duct dilatation.  The pancreas is normal.  The spleen is normal.  The adrenal glands and kidneys are normal.  Minimal scarring changes are noted.  No renal calculi or  hydronephrosis.  The stomach, duodenum, small bowel and colon are unremarkable.  No inflammatory changes or mass lesions.  No obstruction.  The appendix is normal.  No mesenteric or retroperitoneal mass or adenopathy.  Small scattered lymph nodes are noted.  The aorta demonstrates scattered atherosclerotic calcifications but no focal aneurysm or dissection.  The major branch vessels are patent.  The uterus and ovaries are normal.  The bladder is mildly distended.  No bladder wall thickening or bladder calculi.  No pelvic mass, adenopathy or free pelvic fluid collections.  No inguinal mass or hernia.  The bony structures are intact.  The severe degenerative disc disease is again noted at L1-2 with discogenic sclerosis.  There is also a stable hemangioma in the L1 vertebral body.  The bony pelvis is intact.  Mild hip joint degenerative changes.  IMPRESSION:  1.  No acute abdominal/pelvic findings, mass lesions or adenopathy. 2.  Minimal bilateral renal scarring changes. 3.  Scattered atherosclerotic changes involving the aorta and iliac vessels.   Original Report Authenticated By: Rudie Meyer, M.D.    Dg Chest Portable 1 View  07/30/2012  *RADIOLOGY REPORT*  Clinical Data: Chest pain  PORTABLE CHEST - 1 VIEW  Comparison: 02/10/2012  Findings: Background COPD/emphysema noted.  Mild cardiac enlargement but normal vascularity.  Negative for CHF, pneumonia, collapse, consolidation, effusion or pneumothorax.  Monitor leads overlie the chest.  Atherosclerosis of the aorta noted.  Trachea is midline.  IMPRESSION: COPD/emphysema.  No acute process.   Original Report Authenticated By: Judie Petit. Miles Costain, M.D.     Lab Results: Basic Metabolic Panel: No results found for this basename: NA, K, CL, CO2, GLUCOSE, BUN, CREATININE, CALCIUM, MG, PHOS,  in the last 72 hours Liver Function Tests:  Recent Labs  08/02/12 0911  AST 17  ALT 19  ALKPHOS 65  BILITOT 0.2*  PROT 5.6*  ALBUMIN 2.9*     CBC: No results found for  this basename: WBC, NEUTROABS, HGB, HCT, MCV, PLT,  in the last 72 hours  No results found for this or any previous visit (from the past 240 hour(s)).   Hospital Course: She was admitted with respiratory failure. She had been in my office on the day of admission and was having an acute exacerbation of COPD and I had intended to treat her as an outpatient. However after she got home she became much more short of breath and came to the emergency room in respiratory distress. She was treated with BiPAP inhaled bronchodilators steroids and improved. She feels that her problems with her lungs were precipitated by abdominal discomfort and swelling. She has had extensive workup of this it has not had  recent CT so that was done which did not show any definite abnormalities. She was felt to have functional abdominal pain possibly related to bacterial overgrowth. By the time of discharge her abdomen was soft and she was breathing at baseline  Discharge Exam: Blood pressure 98/64, pulse 77, temperature 98.1 F (36.7 C), temperature source Oral, resp. rate 20, height 5\' 3"  (1.6 m), weight 67.8 kg (149 lb 7.6 oz), SpO2 99.00%. She is awake and alert. Her chest is clear. Her abdomen is soft without masses  Disposition: Home. She will start probiotic agent and I explained to her that this may take 4-6 weeks to get fully into her system and perhaps improve her condition      Discharge Orders   Future Appointments Provider Department Dept Phone   08/13/2012 2:00 PM Jodelle Gross, NP [facility] [phone]   Future Orders Complete By Expires     Discharge patient  As directed          Signed: Lexandra Rettke L Pager [phone]  08/03/2012, 8:59 AM

## 2012-08-13 ENCOUNTER — Ambulatory Visit (INDEPENDENT_AMBULATORY_CARE_PROVIDER_SITE_OTHER): Payer: 59 | Admitting: Adult Health

## 2012-08-13 ENCOUNTER — Encounter: Payer: Self-pay | Admitting: Adult Health

## 2012-08-13 VITALS — BP 92/78 | HR 98 | Ht 61.0 in | Wt 141.0 lb

## 2012-08-13 DIAGNOSIS — I2510 Atherosclerotic heart disease of native coronary artery without angina pectoris: Secondary | ICD-10-CM

## 2012-08-13 DIAGNOSIS — Z720 Tobacco use: Secondary | ICD-10-CM

## 2012-08-13 DIAGNOSIS — F17200 Nicotine dependence, unspecified, uncomplicated: Secondary | ICD-10-CM

## 2012-08-13 DIAGNOSIS — I2589 Other forms of chronic ischemic heart disease: Secondary | ICD-10-CM

## 2012-08-13 DIAGNOSIS — I255 Ischemic cardiomyopathy: Secondary | ICD-10-CM

## 2012-08-13 DIAGNOSIS — I5022 Chronic systolic (congestive) heart failure: Secondary | ICD-10-CM

## 2012-08-13 DIAGNOSIS — I509 Heart failure, unspecified: Secondary | ICD-10-CM

## 2012-08-13 NOTE — Progress Notes (Signed)
HPI: Kristin Wells is a 62 y/o patient we are seeing post hospitalization with history of VDRF, COPD, hypertension, ongoing tobacco abuse, and anxiety. She was given a stress myoview after recovering from her COPD exacerbation demonstrating " low risk with no significant stress induced EKG abnormalities. Normal LV systolic function. She was recently admitted and discharged 2 weeks ago in March of 2013 secondary to respiratory failure by Dr. Juanetta Gosling. She was treated with successful diuresis and antibiotic therapy. She is here for ongoing assessment   She is without complaint of chest pressure today she continues to have problems with abdominal distention related to chronic bowel issues. The patient is medically compliant, she has been walking, is using inhalers as directed, and watching her diet.   No Known Allergies  Current Outpatient Prescriptions  Medication Sig Dispense Refill  . acidophilus (RISAQUAD) CAPS Take 1 capsule by mouth daily.  30 capsule  5  . albuterol (PROVENTIL HFA;VENTOLIN HFA) 108 (90 BASE) MCG/ACT inhaler Inhale 1-2 puffs into the lungs every 6 (six) hours as needed for wheezing.      Marland Kitchen albuterol (PROVENTIL) (2.5 MG/3ML) 0.083% nebulizer solution Take 3 mLs (2.5 mg total) by nebulization every 6 (six) hours as needed.  75 mL  1  . ALPRAZolam (XANAX) 0.5 MG tablet Take 1 tablet (0.5 mg total) by mouth 3 (three) times daily as needed for anxiety.  15 tablet  0  . calcium carbonate (OS-CAL - DOSED IN MG OF ELEMENTAL CALCIUM) 1250 MG tablet Take 1 tablet by mouth daily.      . cetirizine (ZYRTEC) 10 MG tablet Take 10 mg by mouth daily as needed for allergies.      . cyclobenzaprine (FLEXERIL) 5 MG tablet Take 5 mg by mouth 2 (two) times daily as needed.       . fluticasone (FLONASE) 50 MCG/ACT nasal spray       . Fluticasone-Salmeterol (ADVAIR) 250-50 MCG/DOSE AEPB Inhale 1 puff into the lungs every 12 (twelve) hours.  60 each  1  . gabapentin (NEURONTIN) 300 MG capsule Take 300 mg  by mouth 2 (two) times daily.      Marland Kitchen levofloxacin (LEVAQUIN) 500 MG tablet Take 500 mg by mouth daily.       Marland Kitchen omeprazole (PRILOSEC) 20 MG capsule Take 20 mg by mouth 2 (two) times daily.      Marland Kitchen oxyCODONE-acetaminophen (PERCOCET/ROXICET) 5-325 MG per tablet Take 0.5 tablets by mouth 2 (two) times daily as needed for pain.      Marland Kitchen PARoxetine (PAXIL) 30 MG tablet Take 1 tablet (30 mg total) by mouth every morning.  30 tablet  1  . polyethylene glycol powder (GLYCOLAX/MIRALAX) powder Take 17 g by mouth daily.  255 g  1  . predniSONE (DELTASONE) 10 MG tablet Take 10 mg by mouth daily.       . rosuvastatin (CRESTOR) 10 MG tablet Take 10 mg by mouth daily.      Marland Kitchen tiotropium (SPIRIVA) 18 MCG inhalation capsule Place 1 capsule (18 mcg total) into inhaler and inhale daily.  30 capsule  1  . traZODone (DESYREL) 50 MG tablet Take 25 mg by mouth at bedtime.      . Vitamin D, Ergocalciferol, (DRISDOL) 50000 UNITS CAPS Take 50,000 Units by mouth every 7 (seven) days. Takes every Monday       No current facility-administered medications for this visit.    Past Medical History  Diagnosis Date  . COPD (chronic obstructive pulmonary disease)   .  Asthma   . Bronchitis   . Anxiety   . Back pain   . Demand ischemia of myocardium 06/28/2011  . Thyroid enlargement 06/28/2011  . MI, acute, non ST segment elevation 06/29/2011    Myoview stress test revealed mild perfusion defect  . Ischemic cardiomyopathy 06/28/2011    EF 25%. Myoview stress test later showed ejection fraction of 65%  . PNA (pneumonia) 06/29/2011  . Acute respiratory failure 06/2011    Vent dependent sec to COPD/PNA  . Hypertension   . Coronary artery disease   . Myocardial infarction   . Pneumonia     Past Surgical History  Procedure Laterality Date  . Abdominal hysterectomy    . Esophagogastroduodenoscopy  Feb 2013    Dr. Bosie Clos: normal duodenum, candida esophagitis, reactive gastropathy  . Colonoscopy  Feb 2013    Dr. Bosie Clos:  internal hemorrhoids, normal view of ileum, multiple hyperplastic polyps    ZOX:WRUEAV of systems complete and found to be negative unless listed above  PHYSICAL EXAM BP 92/78  Pulse 98  Ht 5\' 1"  (1.549 m)  Wt 141 lb (63.957 kg)  BMI 26.66 kg/m2  SpO2 96%  General: Well developed, well nourished, in no acute distress Head: Eyes PERRLA, No xanthomas.   Normal cephalic and atramatic  Lungs: Bilaterally crackles in the bases, no wheezes or rhonchi Heart: HRRR S1 S2, without MRG.  Pulses are 2+ & equal.            No carotid bruit. No JVD.  No abdominal bruits. No femoral bruits. Abdomen: Bowel sounds are positive, abdomen soft and non-tender without masses or                  Hernia's noted. Msk:  Back normal, normal gait. Normal strength and tone for age. Extremities: No clubbing, cyanosis or edema.  Mild varicosities.DP +1 Neuro: Alert and oriented X 3. Psych:  Good affect, responds appropriately    ASSESSMENT AND PLAN

## 2012-08-13 NOTE — Assessment & Plan Note (Signed)
He appears well compensated at this time. Lung sounds have some crackles, related to COPD. She denies significant dyspnea on exertion I not finding any evidence of fluid retention. Abdomen remains obese.

## 2012-08-13 NOTE — Assessment & Plan Note (Signed)
No cardiac complaints at this time. The patient will be continued on risk factor management. Will see her again in 6 months unless symptomatic.

## 2012-08-13 NOTE — Patient Instructions (Addendum)
Your physician recommends that you schedule a follow-up appointment in: 6 months  

## 2012-08-13 NOTE — Assessment & Plan Note (Signed)
We will repeat echocardiogram prior to next visit. She knows to call us sooner if she is symptomatic otherwise she will continue on current medication regimen.

## 2012-08-13 NOTE — Assessment & Plan Note (Signed)
She denies any further use of tobacco.

## 2012-08-13 NOTE — Progress Notes (Deleted)
Name: Kristin Wells    DOB: June 02, 1950  Age: 62 y.o.  MR#: [id number]       PCP:  Dorrene German, MD      Insurance: Payor: Cleatrice Burke  Plan: [place] [employer]  Product Type: *No Product type*    CC:   No chief complaint on file.   VS Filed Vitals:   08/13/12 1319  BP: 92/78  Pulse: 98  Height: 5\' 1"  (1.549 m)  Weight: 141 lb (63.957 kg)  SpO2: 96%    Weights Current Weight  08/13/12 141 lb (63.957 kg)  08/02/12 149 lb 7.6 oz (67.8 kg)  02/24/12 143 lb 15.4 oz (65.3 kg)    Blood Pressure  BP Readings from Last 3 Encounters:  08/13/12 92/78  08/02/12 98/64  02/24/12 121/75     Admit date:  (Not on file) Last encounter with RMR:  Visit date not found   Allergy Review of patient's allergies indicates no known allergies.  Current Outpatient Prescriptions  Medication Sig Dispense Refill  . acidophilus (RISAQUAD) CAPS Take 1 capsule by mouth daily.  30 capsule  5  . albuterol (PROVENTIL HFA;VENTOLIN HFA) 108 (90 BASE) MCG/ACT inhaler Inhale 1-2 puffs into the lungs every 6 (six) hours as needed for wheezing.      Marland Kitchen albuterol (PROVENTIL) (2.5 MG/3ML) 0.083% nebulizer solution Take 3 mLs (2.5 mg total) by nebulization every 6 (six) hours as needed.  75 mL  1  . ALPRAZolam (XANAX) 0.5 MG tablet Take 1 tablet (0.5 mg total) by mouth 3 (three) times daily as needed for anxiety.  15 tablet  0  . calcium carbonate (OS-CAL - DOSED IN MG OF ELEMENTAL CALCIUM) 1250 MG tablet Take 1 tablet by mouth daily.      . cetirizine (ZYRTEC) 10 MG tablet Take 10 mg by mouth daily as needed for allergies.      . cyclobenzaprine (FLEXERIL) 5 MG tablet Take 5 mg by mouth 2 (two) times daily as needed.       . fluticasone (FLONASE) 50 MCG/ACT nasal spray       . Fluticasone-Salmeterol (ADVAIR) 250-50 MCG/DOSE AEPB Inhale 1 puff into the lungs every 12 (twelve) hours.  60 each  1  . gabapentin (NEURONTIN) 300 MG capsule Take 300 mg by mouth 2 (two) times daily.      Marland Kitchen levofloxacin (LEVAQUIN)  500 MG tablet Take 500 mg by mouth daily.       Marland Kitchen omeprazole (PRILOSEC) 20 MG capsule Take 20 mg by mouth 2 (two) times daily.      Marland Kitchen oxyCODONE-acetaminophen (PERCOCET/ROXICET) 5-325 MG per tablet Take 0.5 tablets by mouth 2 (two) times daily as needed for pain.      Marland Kitchen PARoxetine (PAXIL) 30 MG tablet Take 1 tablet (30 mg total) by mouth every morning.  30 tablet  1  . polyethylene glycol powder (GLYCOLAX/MIRALAX) powder Take 17 g by mouth daily.  255 g  1  . predniSONE (DELTASONE) 10 MG tablet Take 10 mg by mouth daily.       . rosuvastatin (CRESTOR) 10 MG tablet Take 10 mg by mouth daily.      Marland Kitchen tiotropium (SPIRIVA) 18 MCG inhalation capsule Place 1 capsule (18 mcg total) into inhaler and inhale daily.  30 capsule  1  . traZODone (DESYREL) 50 MG tablet Take 25 mg by mouth at bedtime.      . Vitamin D, Ergocalciferol, (DRISDOL) 50000 UNITS CAPS Take 50,000 Units by mouth every 7 (seven) days. Takes  every Monday       No current facility-administered medications for this visit.    Discontinued Meds:    Medications Discontinued During This Encounter  Medication Reason  . albuterol (PROVENTIL HFA;VENTOLIN HFA) 108 (90 BASE) MCG/ACT inhaler     Patient Active Problem List  Diagnosis  . HYPOKALEMIA  . ANXIETY  . SMOKER  . DEPRESSION  . Other chronic pain  . COPD  . Acute respiratory failure  . Demand ischemia of myocardium  . Hyperglycemia  . Thyroid enlargement  . Ischemic cardiomyopathy  . MI, acute, non ST segment elevation  . COPD (chronic obstructive pulmonary disease) with acute bronchitis  . HTN (hypertension)  . Tobacco abuse  . Anxiety disorder  . Hypercapnia  . Lethargy  . COPD exacerbation  . Respiratory failure, acute-on-chronic  . Chronic back pain  . CAD (coronary artery disease)  . Systolic CHF, chronic    LABS    Component Value Date/Time   NA 140 07/30/2012 1648   NA 136 02/23/2012 0430   NA 139 02/21/2012 0326   K 3.6 07/30/2012 1648   K 4.4  02/23/2012 0430   K 4.2 02/21/2012 0326   CL 98 07/30/2012 1648   CL 100 02/23/2012 0430   CL 100 02/21/2012 0326   CO2 30 07/30/2012 1648   CO2 27 02/23/2012 0430   CO2 29 02/21/2012 0326   GLUCOSE 101* 07/30/2012 1648   GLUCOSE 178* 02/23/2012 0430   GLUCOSE 155* 02/21/2012 0326   BUN 5* 07/30/2012 1648   BUN 22 02/23/2012 0430   BUN 10 02/21/2012 0326   CREATININE 0.81 07/30/2012 1648   CREATININE 0.96 02/23/2012 0430   CREATININE 0.75 02/21/2012 0326   CALCIUM 9.7 07/30/2012 1648   CALCIUM 9.7 02/23/2012 0430   CALCIUM 9.3 02/21/2012 0326   GFRNONAA 77* 07/30/2012 1648   GFRNONAA 63* 02/23/2012 0430   GFRNONAA >90 02/21/2012 0326   GFRAA 90* 07/30/2012 1648   GFRAA 73* 02/23/2012 0430   GFRAA >90 02/21/2012 0326   CMP     Component Value Date/Time   NA 140 07/30/2012 1648   K 3.6 07/30/2012 1648   CL 98 07/30/2012 1648   CO2 30 07/30/2012 1648   GLUCOSE 101* 07/30/2012 1648   BUN 5* 07/30/2012 1648   CREATININE 0.81 07/30/2012 1648   CALCIUM 9.7 07/30/2012 1648   PROT 5.6* 08/02/2012 0911   ALBUMIN 2.9* 08/02/2012 0911   AST 17 08/02/2012 0911   ALT 19 08/02/2012 0911   ALKPHOS 65 08/02/2012 0911   BILITOT 0.2* 08/02/2012 0911   GFRNONAA 77* 07/30/2012 1648   GFRAA 90* 07/30/2012 1648       Component Value Date/Time   WBC 9.2 07/30/2012 1648   WBC 7.6 02/21/2012 0326   WBC 15.0* 02/20/2012 1500   HGB 16.3* 07/30/2012 1648   HGB 13.7 02/21/2012 0326   HGB 15.4* 02/20/2012 1500   HCT 49.2* 07/30/2012 1648   HCT 42.9 02/21/2012 0326   HCT 47.7* 02/20/2012 1500   MCV 85.0 07/30/2012 1648   MCV 88.6 02/21/2012 0326   MCV 89.8 02/20/2012 1500    Lipid Panel     Component Value Date/Time   CHOL 206* 07/01/2011 0534   TRIG 134 07/01/2011 0534   HDL 34* 07/01/2011 0534   CHOLHDL 6.1 07/01/2011 0534   VLDL 27 07/01/2011 0534   LDLCALC 145* 07/01/2011 0534    ABG    Component Value Date/Time   PHART 7.415 07/30/2012 1659   PCO2ART  43.3 07/30/2012 1659   PO2ART 80.4 07/30/2012 1659    HCO3 27.3* 07/30/2012 1659   TCO2 23.1 07/30/2012 1659   ACIDBASEDEF 2.2* 06/28/2011 0420   O2SAT 95.8 07/30/2012 1659     Lab Results  Component Value Date   TSH 1.325 06/28/2011   BNP (last 3 results)  Recent Labs  02/22/12 0500 07/30/12 1648  PROBNP 705.9* 4358.0*   Cardiac Panel (last 3 results) No results found for this basename: CKTOTAL, CKMB, TROPONINI, RELINDX,  in the last 72 hours  Iron/TIBC/Ferritin No results found for this basename: iron, tibc, ferritin     EKG Orders placed during the hospital encounter of 07/30/12  . ED EKG  . ED EKG  . EKG 12-LEAD  . EKG 12-LEAD  . ED EKG  . ED EKG  . EKG  . EKG     Prior Assessment and Plan Problem List as of 08/13/2012     ICD-9-CM     Cardiology Problems   CAD (coronary artery disease)   Systolic CHF, chronic   Demand ischemia of myocardium   Ischemic cardiomyopathy   MI, acute, non ST segment elevation   Last Assessment & Plan   08/18/2011 Office Visit Written 08/18/2011 12:15 PM by Jodelle Gross, NP     This was found to be related to demand ischemia in the setting of respiratory failure. She had a normal stress test during admission. Echo was abnormal EF of 25%-30& during acute phase of admission. Will continue her on current medications. No BB in the setting of COPD. Will repeat echo in 6 months for ongoing evaluation of EF.     HTN (hypertension)   Last Assessment & Plan   08/18/2011 Office Visit Written 08/18/2011 12:16 PM by Jodelle Gross, NP     Blood pressure is well controlled at present. She is asymptomatic for dizziness or pre-syncope.  No changes.      Other   Chronic back pain   HYPOKALEMIA   ANXIETY   SMOKER   Last Assessment & Plan   08/18/2011 Office Visit Written 08/18/2011 12:17 PM by Jodelle Gross, NP     I have spoken with her about immediate cessation of smoking. She is actively trying to quit. I have advised her that her life-expectancy is shorted significantly if she  continues to smoke with history of COPD. She verbalizes understanding.    DEPRESSION   Other chronic pain   COPD   Acute respiratory failure   Hyperglycemia   Thyroid enlargement   COPD (chronic obstructive pulmonary disease) with acute bronchitis   Tobacco abuse   Anxiety disorder   Hypercapnia   Lethargy   COPD exacerbation   Respiratory failure, acute-on-chronic       Imaging: Ct Abdomen Pelvis W Contrast  07/31/2012  *RADIOLOGY REPORT*  Clinical Data: The abdominal pain and bloating.  CT ABDOMEN AND PELVIS WITH CONTRAST  Technique:  Multidetector CT imaging of the abdomen and pelvis was performed following the standard protocol during bolus administration of intravenous contrast.  Contrast: 50mL OMNIPAQUE IOHEXOL 300 MG/ML  SOLN, OMNIPAQUE IOHEXOL 300 MG/ML  SOLN  Comparison: None.  Findings: The lung bases are clear except for minimal streaky subsegmental atelectasis.  The heart is normal in size.  No pericardial effusion.  The distal esophagus is unremarkable.  The liver is unremarkable.  No focal hepatic lesions or intrahepatic ductal dilatation.  The gallbladder is normal except for a possible tiny calcified gallstone.  No common bile  duct dilatation.  The pancreas is normal.  The spleen is normal.  The adrenal glands and kidneys are normal.  Minimal scarring changes are noted.  No renal calculi or hydronephrosis.  The stomach, duodenum, small bowel and colon are unremarkable.  No inflammatory changes or mass lesions.  No obstruction.  The appendix is normal.  No mesenteric or retroperitoneal mass or adenopathy.  Small scattered lymph nodes are noted.  The aorta demonstrates scattered atherosclerotic calcifications but no focal aneurysm or dissection.  The major branch vessels are patent.  The uterus and ovaries are normal.  The bladder is mildly distended.  No bladder wall thickening or bladder calculi.  No pelvic mass, adenopathy or free pelvic fluid collections.  No inguinal mass  or hernia.  The bony structures are intact.  The severe degenerative disc disease is again noted at L1-2 with discogenic sclerosis.  There is also a stable hemangioma in the L1 vertebral body.  The bony pelvis is intact.  Mild hip joint degenerative changes.  IMPRESSION:  1.  No acute abdominal/pelvic findings, mass lesions or adenopathy. 2.  Minimal bilateral renal scarring changes. 3.  Scattered atherosclerotic changes involving the aorta and iliac vessels.   Original Report Authenticated By: Rudie Meyer, M.D.    Dg Chest Portable 1 View  07/30/2012  *RADIOLOGY REPORT*  Clinical Data: Chest pain  PORTABLE CHEST - 1 VIEW  Comparison: 02/10/2012  Findings: Background COPD/emphysema noted.  Mild cardiac enlargement but normal vascularity.  Negative for CHF, pneumonia, collapse, consolidation, effusion or pneumothorax.  Monitor leads overlie the chest.  Atherosclerosis of the aorta noted.  Trachea is midline.  IMPRESSION: COPD/emphysema.  No acute process.   Original Report Authenticated By: Judie Petit. Miles Costain, M.D.

## 2012-10-03 ENCOUNTER — Encounter (HOSPITAL_COMMUNITY): Payer: Self-pay | Admitting: Emergency Medicine

## 2012-10-03 ENCOUNTER — Inpatient Hospital Stay (HOSPITAL_COMMUNITY)
Admission: EM | Admit: 2012-10-03 | Discharge: 2012-10-05 | DRG: 190 | Disposition: A | Discharge status: Home / Self Care | Payer: 59 | Attending: Pulmonary Disease | Admitting: Pulmonary Disease

## 2012-10-03 ENCOUNTER — Encounter: Payer: Self-pay | Admitting: *Deleted

## 2012-10-03 ENCOUNTER — Emergency Department (HOSPITAL_COMMUNITY): Payer: 59

## 2012-10-03 DIAGNOSIS — Z8249 Family history of ischemic heart disease and other diseases of the circulatory system: Secondary | ICD-10-CM

## 2012-10-03 DIAGNOSIS — I2589 Other forms of chronic ischemic heart disease: Secondary | ICD-10-CM | POA: Diagnosis present

## 2012-10-03 DIAGNOSIS — I509 Heart failure, unspecified: Secondary | ICD-10-CM | POA: Diagnosis present

## 2012-10-03 DIAGNOSIS — Z79899 Other long term (current) drug therapy: Secondary | ICD-10-CM

## 2012-10-03 DIAGNOSIS — F32A Depression, unspecified: Secondary | ICD-10-CM | POA: Diagnosis present

## 2012-10-03 DIAGNOSIS — F419 Anxiety disorder, unspecified: Secondary | ICD-10-CM

## 2012-10-03 DIAGNOSIS — J9611 Chronic respiratory failure with hypoxia: Secondary | ICD-10-CM | POA: Diagnosis present

## 2012-10-03 DIAGNOSIS — R739 Hyperglycemia, unspecified: Secondary | ICD-10-CM | POA: Diagnosis present

## 2012-10-03 DIAGNOSIS — F410 Panic disorder [episodic paroxysmal anxiety] without agoraphobia: Secondary | ICD-10-CM | POA: Diagnosis present

## 2012-10-03 DIAGNOSIS — J9610 Chronic respiratory failure, unspecified whether with hypoxia or hypercapnia: Secondary | ICD-10-CM | POA: Diagnosis present

## 2012-10-03 DIAGNOSIS — I5022 Chronic systolic (congestive) heart failure: Secondary | ICD-10-CM | POA: Diagnosis present

## 2012-10-03 DIAGNOSIS — J45901 Unspecified asthma with (acute) exacerbation: Principal | ICD-10-CM | POA: Diagnosis present

## 2012-10-03 DIAGNOSIS — F3289 Other specified depressive episodes: Secondary | ICD-10-CM | POA: Diagnosis present

## 2012-10-03 DIAGNOSIS — I252 Old myocardial infarction: Secondary | ICD-10-CM

## 2012-10-03 DIAGNOSIS — J9620 Acute and chronic respiratory failure, unspecified whether with hypoxia or hypercapnia: Secondary | ICD-10-CM | POA: Diagnosis present

## 2012-10-03 DIAGNOSIS — Z833 Family history of diabetes mellitus: Secondary | ICD-10-CM

## 2012-10-03 DIAGNOSIS — F329 Major depressive disorder, single episode, unspecified: Secondary | ICD-10-CM | POA: Diagnosis present

## 2012-10-03 DIAGNOSIS — F411 Generalized anxiety disorder: Secondary | ICD-10-CM | POA: Diagnosis present

## 2012-10-03 DIAGNOSIS — I2510 Atherosclerotic heart disease of native coronary artery without angina pectoris: Secondary | ICD-10-CM | POA: Diagnosis present

## 2012-10-03 DIAGNOSIS — G8929 Other chronic pain: Secondary | ICD-10-CM | POA: Diagnosis present

## 2012-10-03 DIAGNOSIS — M549 Dorsalgia, unspecified: Secondary | ICD-10-CM | POA: Diagnosis present

## 2012-10-03 DIAGNOSIS — E669 Obesity, unspecified: Secondary | ICD-10-CM | POA: Diagnosis present

## 2012-10-03 DIAGNOSIS — Z6826 Body mass index (BMI) 26.0-26.9, adult: Secondary | ICD-10-CM

## 2012-10-03 DIAGNOSIS — J441 Chronic obstructive pulmonary disease with (acute) exacerbation: Principal | ICD-10-CM | POA: Diagnosis present

## 2012-10-03 DIAGNOSIS — F17200 Nicotine dependence, unspecified, uncomplicated: Secondary | ICD-10-CM | POA: Diagnosis present

## 2012-10-03 DIAGNOSIS — J9600 Acute respiratory failure, unspecified whether with hypoxia or hypercapnia: Secondary | ICD-10-CM | POA: Diagnosis present

## 2012-10-03 DIAGNOSIS — I10 Essential (primary) hypertension: Secondary | ICD-10-CM | POA: Diagnosis present

## 2012-10-03 DIAGNOSIS — E119 Type 2 diabetes mellitus without complications: Secondary | ICD-10-CM | POA: Diagnosis present

## 2012-10-03 LAB — URINALYSIS, ROUTINE W REFLEX MICROSCOPIC
Bilirubin Urine: NEGATIVE
Glucose, UA: NEGATIVE mg/dL
Ketones, ur: NEGATIVE mg/dL
Leukocytes, UA: NEGATIVE
Nitrite: NEGATIVE
Protein, ur: NEGATIVE mg/dL
Specific Gravity, Urine: <1.005 — ABNORMAL LOW (ref 1.005–1.030)
Urobilinogen, UA: 0.2 mg/dL (ref 0.0–1.0)
pH: 6 (ref 5.0–8.0)

## 2012-10-03 LAB — BLOOD GAS, ARTERIAL
Acid-Base Excess: 3.2 mmol/L — ABNORMAL HIGH (ref 0.0–2.0)
Bicarbonate: 28.8 mEq/L — ABNORMAL HIGH (ref 20.0–24.0)
Drawn by: 22223
O2 Content: 3 L/min
O2 Saturation: 92.6 %
Patient temperature: 37
TCO2: 25.5 mmol/L (ref 0–100)
pCO2 arterial: 57.6 mmHg (ref 35.0–45.0)
pH, Arterial: 7.32 — ABNORMAL LOW (ref 7.350–7.450)
pO2, Arterial: 70.2 mmHg — ABNORMAL LOW (ref 80.0–100.0)

## 2012-10-03 LAB — BASIC METABOLIC PANEL
BUN: 7 mg/dL (ref 6–23)
CO2: 35 mEq/L — ABNORMAL HIGH (ref 19–32)
Calcium: 9.6 mg/dL (ref 8.4–10.5)
Chloride: 102 mEq/L (ref 96–112)
Creatinine, Ser: 0.8 mg/dL (ref 0.50–1.10)
GFR calc Af Amer: >90 mL/min (ref >90)
GFR calc non Af Amer: 79 mL/min — ABNORMAL LOW (ref >90)
Glucose, Bld: 153 mg/dL — ABNORMAL HIGH (ref 70–99)
Potassium: 3.7 mEq/L (ref 3.5–5.1)
Sodium: 143 mEq/L (ref 135–145)

## 2012-10-03 LAB — CBC WITH DIFFERENTIAL/PLATELET
Basophils Absolute: 0 10*3/uL (ref 0.0–0.1)
Basophils Relative: 0 % (ref 0–1)
Eosinophils Absolute: 0.4 10*3/uL (ref 0.0–0.7)
Eosinophils Relative: 5 % (ref 0–5)
HCT: 44.2 % (ref 36.0–46.0)
Hemoglobin: 14.4 g/dL (ref 12.0–15.0)
Lymphocytes Relative: 35 % (ref 12–46)
Lymphs Abs: 3.3 10*3/uL (ref 0.7–4.0)
MCH: 28.9 pg (ref 26.0–34.0)
MCHC: 32.6 g/dL (ref 30.0–36.0)
MCV: 88.8 fL (ref 78.0–100.0)
Monocytes Absolute: 0.5 10*3/uL (ref 0.1–1.0)
Monocytes Relative: 6 % (ref 3–12)
Neutro Abs: 5.1 10*3/uL (ref 1.7–7.7)
Neutrophils Relative %: 54 % (ref 43–77)
Platelets: 255 10*3/uL (ref 150–400)
RBC: 4.98 MIL/uL (ref 3.87–5.11)
RDW: 16.1 % — ABNORMAL HIGH (ref 11.5–15.5)
WBC: 9.4 10*3/uL (ref 4.0–10.5)

## 2012-10-03 LAB — URINE MICROSCOPIC-ADD ON

## 2012-10-03 MED ORDER — METHYLPREDNISOLONE SODIUM SUCC 125 MG IJ SOLR
125.0000 mg | Freq: Once | INTRAMUSCULAR | Status: AC
  Administered 2012-10-03: 125 mg via INTRAVENOUS
  Filled 2012-10-03: qty 2

## 2012-10-03 MED ORDER — SODIUM CHLORIDE 0.9 % IV SOLN: INTRAVENOUS | Status: DC

## 2012-10-03 MED ORDER — MAGNESIUM SULFATE 40 MG/ML IJ SOLN
2.0000 g | Freq: Once | INTRAMUSCULAR | Status: AC
  Administered 2012-10-03: 2 g via INTRAVENOUS
  Filled 2012-10-03: qty 50

## 2012-10-03 MED ORDER — ALBUTEROL (5 MG/ML) CONTINUOUS INHALATION SOLN
10.0000 mg/h | INHALATION_SOLUTION | Freq: Once | RESPIRATORY_TRACT | Status: AC
  Administered 2012-10-03: 10 mg/h via RESPIRATORY_TRACT
  Filled 2012-10-03: qty 20

## 2012-10-03 MED ORDER — ALBUTEROL SULFATE (5 MG/ML) 0.5% IN NEBU
2.5000 mg | INHALATION_SOLUTION | Freq: Once | RESPIRATORY_TRACT | Status: AC
  Administered 2012-10-03: 2.5 mg via RESPIRATORY_TRACT
  Filled 2012-10-03: qty 0.5

## 2012-10-03 MED ORDER — SODIUM CHLORIDE 0.9 % IV SOLN
Freq: Once | INTRAVENOUS | Status: AC
  Administered 2012-10-03: 21:00:00 via INTRAVENOUS

## 2012-10-03 NOTE — ED Notes (Signed)
Patient presents to ER via [facility] with c/o shortness of breath.  Patient was seen by home health nurse earlier today and has been getting progressively worse throughout the day.  Patient with expiratory wheezes and nonproductive cough.

## 2012-10-03 NOTE — ED Notes (Signed)
  CRITICAL VALUE ALERT  Critical value received:  co2 57.6 Date of notification:  10/03/12 Time of notification:  2151  Critical value read [email or web address]  Nurse who received alert:  P.Giovan Pinsky,rn  MD notified (1st page): [email or web address]  Time of first page:  2151  MD notified (2nd page):  Time of second page:  Responding MD: [email or web address]  Time MD responded:  2151

## 2012-10-03 NOTE — ED Provider Notes (Signed)
History     CSN: [id number]  Arrival date & time 10/03/12  1931   First MD Initiated Contact with Patient 10/03/12 2054      Chief Complaint  Patient presents with  . Shortness of Breath    (Consider location/radiation/quality/duration/timing/severity/associated sxs/prior treatment) Patient is a 62 y.o. female presenting with shortness of breath. The history is provided by the patient and medical records.  Shortness of Breath Severity:  Severe Onset quality:  Gradual Duration: Patient is a 62 year old woman with known COPD who has had progressively worsening severe shortness of breath throughout the day. She was seen by a home health nurse who called EMS and patient was brought to [facility] ED for evaluation and treatm. Timing:  Constant Progression:  Worsening Chronicity:  Recurrent Context comment:  She continues to smoke. Relieved by:  Nothing Worsened by:  Nothing tried Ineffective treatments: She has used her albuterol nebulizer machine without relief. Associated symptoms: wheezing   Associated symptoms: no fever   Risk factors: tobacco use     Past Medical History  Diagnosis Date  . COPD (chronic obstructive pulmonary disease)   . Asthma   . Bronchitis   . Anxiety   . Back pain   . Demand ischemia of myocardium 06/28/2011  . Thyroid enlargement 06/28/2011  . MI, acute, non ST segment elevation 06/29/2011    Myoview stress test revealed mild perfusion defect  . Ischemic cardiomyopathy 06/28/2011    EF 25%. Myoview stress test later showed ejection fraction of 65%  . PNA (pneumonia) 06/29/2011  . Acute respiratory failure 06/2011    Vent dependent sec to COPD/PNA  . Hypertension   . Coronary artery disease   . Myocardial infarction   . Pneumonia     Past Surgical History  Procedure Laterality Date  . Abdominal hysterectomy    . Esophagogastroduodenoscopy  Feb 2013    Dr. Bosie Clos: normal duodenum, candida esophagitis, reactive gastropathy  .  Colonoscopy  Feb 2013    Dr. Bosie Clos: internal hemorrhoids, normal view of ileum, multiple hyperplastic polyps    Family History  Problem Relation Age of Onset  . Diabetes Mother   . Diabetes Father   . Hypertension Mother   . Hypertension Father   . Colon cancer Neg Hx     History  Substance Use Topics  . Smoking status: Current Every Day Smoker    Types: Cigarettes    Last Attempt to Quit: 03/01/2012  . Smokeless tobacco: Never Used  . Alcohol Use: No    OB History   Grav Para Term Preterm Abortions TAB SAB Ect Mult Living                  Review of Systems  Constitutional: Negative for fever and chills.  HENT: Negative.   Eyes: Negative.   Respiratory: Positive for shortness of breath and wheezing.   Cardiovascular: Negative.   Gastrointestinal: Negative.   Genitourinary: Negative.   Musculoskeletal: Negative.   Skin: Negative.   Neurological: Negative.   Psychiatric/Behavioral: Negative.     Allergies  Review of patient's allergies indicates no known allergies.  Home Medications   Current Outpatient Rx  Name  Route  Sig  Dispense  Refill  . acidophilus (RISAQUAD) CAPS   Oral   Take 1 capsule by mouth daily.   30 capsule   5   . albuterol (PROVENTIL HFA;VENTOLIN HFA) 108 (90 BASE) MCG/ACT inhaler   Inhalation   Inhale 1-2 puffs into the lungs every 6 (  six) hours as needed for wheezing.         Marland Kitchen albuterol (PROVENTIL) (2.5 MG/3ML) 0.083% nebulizer solution   Nebulization   Take 2.5 mg by nebulization 4 (four) times daily.         Marland Kitchen ALPRAZolam (XANAX) 0.5 MG tablet   Oral   Take 1 tablet (0.5 mg total) by mouth 3 (three) times daily as needed for anxiety.   15 tablet   0   . calcium carbonate (OS-CAL - DOSED IN MG OF ELEMENTAL CALCIUM) 1250 MG tablet   Oral   Take 1 tablet by mouth daily.         . cyclobenzaprine (FLEXERIL) 5 MG tablet   Oral   Take 5 mg by mouth 2 (two) times daily as needed.          . Fluticasone-Salmeterol  (ADVAIR) 250-50 MCG/DOSE AEPB   Inhalation   Inhale 1 puff into the lungs every 12 (twelve) hours.   60 each   1   . gabapentin (NEURONTIN) 300 MG capsule   Oral   Take 300 mg by mouth 2 (two) times daily.         Marland Kitchen omeprazole (PRILOSEC) 20 MG capsule   Oral   Take 20 mg by mouth 2 (two) times daily.         Marland Kitchen oxyCODONE-acetaminophen (PERCOCET/ROXICET) 5-325 MG per tablet   Oral   Take 0.5 tablets by mouth 2 (two) times daily as needed for pain.         Marland Kitchen PARoxetine (PAXIL) 30 MG tablet   Oral   Take 1 tablet (30 mg total) by mouth every morning.   30 tablet   1   . rosuvastatin (CRESTOR) 10 MG tablet   Oral   Take 10 mg by mouth daily.         Marland Kitchen tiotropium (SPIRIVA) 18 MCG inhalation capsule   Inhalation   Place 1 capsule (18 mcg total) into inhaler and inhale daily.   30 capsule   1   . traZODone (DESYREL) 50 MG tablet   Oral   Take 25 mg by mouth at bedtime.         . Vitamin D, Ergocalciferol, (DRISDOL) 50000 UNITS CAPS   Oral   Take 50,000 Units by mouth every 7 (seven) days. Takes every Monday         . cetirizine (ZYRTEC) 10 MG tablet   Oral   Take 10 mg by mouth daily as needed for allergies.         . fluticasone (FLONASE) 50 MCG/ACT nasal spray   Nasal   Place 1-2 sprays into the nose daily.          . polyethylene glycol powder (GLYCOLAX/MIRALAX) powder   Oral   Take 17 g by mouth daily.   255 g   1     BP 159/83  Pulse 125  Temp(Src) 98.2 F (36.8 C) (Oral)  Resp 24  Ht 5\' 1"  (1.549 m)  Wt 140 lb (63.504 kg)  BMI 26.47 kg/m2  SpO2 98%  Physical Exam  Nursing note and vitals reviewed. Constitutional: She is oriented to person, place, and time.  Ill-appearing late middle-aged woman who looks older than her stated age.  HENT:  Head: Normocephalic and atraumatic.  Right Ear: External ear normal.  Left Ear: External ear normal.  Mouth/Throat: Oropharynx is clear and moist.  Eyes: Conjunctivae and EOM are normal.  Pupils are equal, round, and reactive to  light.  Neck: Normal range of motion. Neck supple.  Cardiovascular: Normal rate, regular rhythm and normal heart sounds.   Pulmonary/Chest: Effort normal.  She has tight wheezes over her posterior lung fields.  Abdominal: Soft. Bowel sounds are normal.  Musculoskeletal: Normal range of motion. She exhibits no edema and no tenderness.  Neurological: She is alert and oriented to person, place, and time.  No sensory or motor deficit.  Skin: Skin is warm and dry.  Psychiatric: She has a normal mood and affect. Her behavior is normal.    ED Course  Procedures (including critical care time)  Labs Reviewed  BASIC METABOLIC PANEL   Dg Chest Portable 1 View  10/03/2012   *RADIOLOGY REPORT*  Clinical Data: Shortness of breath and bronchitis.  PORTABLE CHEST - 1 VIEW  Comparison: 07/30/2012  Findings: Stable mild chronic lung disease.  Heart size is normal. No edema or infiltrate identified.  No evidence of pleural effusion.  IMPRESSION: No active disease.  Stable chronic lung disease.   Original Report Authenticated By: Irish Lack, M.D.    Date: 10/03/2012  Rate:124  Rhythm: sinus tachycardia  QRS Axis: right  Intervals: normal QRS:  Right ventricular hypertrophy  ST/T Wave abnormalities: nonspecific T wave changes  Conduction Disutrbances:none  Narrative Interpretation: Abnormal EKG  Old EKG Reviewed: unchanged  9:16 PM Patient was seen and had physical examination. Old charts were reviewed. EKG showed a sinus tachycardia. Lab tests were ordered. Continuous albuterol nebulizer treatment, IV Solu-Medrol and 25 mg, and magnesium sulfate 2 g IV, all were ordered.  9:53 PM Results for orders placed during the hospital encounter of 10/03/12  BASIC METABOLIC PANEL      Result Value Range   Sodium 143  135 - 145 mEq/L   Potassium 3.7  3.5 - 5.1 mEq/L   Chloride 102  96 - 112 mEq/L   CO2 35 (*) 19 - 32 mEq/L   Glucose, Bld 153 (*) 70 - 99 mg/dL    BUN 7  6 - 23 mg/dL   Creatinine, Ser 8.11  0.50 - 1.10 mg/dL   Calcium 9.6  8.4 - 91.4 mg/dL   GFR calc non Af Amer 79 (*) >90 mL/min   GFR calc Af Amer >90  >90 mL/min  CBC WITH DIFFERENTIAL      Result Value Range   WBC 9.4  4.0 - 10.5 K/uL   RBC 4.98  3.87 - 5.11 MIL/uL   Hemoglobin 14.4  12.0 - 15.0 g/dL   HCT 78.2  95.6 - 21.3 %   MCV 88.8  78.0 - 100.0 fL   MCH 28.9  26.0 - 34.0 pg   MCHC 32.6  30.0 - 36.0 g/dL   RDW 08.6 (*) 57.8 - 46.9 %   Platelets 255  150 - 400 K/uL   Neutrophils Relative % 54  43 - 77 %   Neutro Abs 5.1  1.7 - 7.7 K/uL   Lymphocytes Relative 35  12 - 46 %   Lymphs Abs 3.3  0.7 - 4.0 K/uL   Monocytes Relative 6  3 - 12 %   Monocytes Absolute 0.5  0.1 - 1.0 K/uL   Eosinophils Relative 5  0 - 5 %   Eosinophils Absolute 0.4  0.0 - 0.7 K/uL   Basophils Relative 0  0 - 1 %   Basophils Absolute 0.0  0.0 - 0.1 K/uL  BLOOD GAS, ARTERIAL      Result Value Range   O2 Content 3.0  Delivery systems NASAL CANNULA     pH, Arterial 7.320 (*) 7.350 - 7.450   pCO2 arterial 57.6 (*) 35.0 - 45.0 mmHg   pO2, Arterial 70.2 (*) 80.0 - 100.0 mmHg   Bicarbonate 28.8 (*) 20.0 - 24.0 mEq/L   TCO2 25.5  0 - 100 mmol/L   Acid-Base Excess 3.2 (*) 0.0 - 2.0 mmol/L   O2 Saturation 92.6     Patient temperature 37.0     Collection site RIGHT RADIAL     Drawn by [id number]     Sample type ARTERIAL     Allens test (pass/fail) PASS  PASS   Dg Chest Portable 1 View  10/03/2012   *RADIOLOGY REPORT*  Clinical Data: Shortness of breath and bronchitis.  PORTABLE CHEST - 1 VIEW  Comparison: 07/30/2012  Findings: Stable mild chronic lung disease.  Heart size is normal. No edema or infiltrate identified.  No evidence of pleural effusion.  IMPRESSION: No active disease.  Stable chronic lung disease.   Original Report Authenticated By: Irish Lack, M.D.    ABG's show a respiratory acidosis. Call to Dr. Vania Rea, who will admit her to Dr. Juanetta Gosling to a telemetry unit for her  COPD exacerbation.  1. COPD with acute exacerbation             Carleene Cooper III, MD 10/03/12 2202

## 2012-10-04 ENCOUNTER — Encounter (HOSPITAL_COMMUNITY): Payer: Self-pay | Admitting: Internal Medicine

## 2012-10-04 DIAGNOSIS — J441 Chronic obstructive pulmonary disease with (acute) exacerbation: Secondary | ICD-10-CM | POA: Diagnosis present

## 2012-10-04 DIAGNOSIS — J9611 Chronic respiratory failure with hypoxia: Secondary | ICD-10-CM | POA: Diagnosis present

## 2012-10-04 LAB — HEMOGLOBIN A1C
Hgb A1c MFr Bld: 6.1 % — ABNORMAL HIGH (ref <5.7)
Mean Plasma Glucose: 128 mg/dL — ABNORMAL HIGH (ref <117)

## 2012-10-04 LAB — BASIC METABOLIC PANEL
BUN: 12 mg/dL (ref 6–23)
CO2: 27 mEq/L (ref 19–32)
Calcium: 8.7 mg/dL (ref 8.4–10.5)
Chloride: 101 mEq/L (ref 96–112)
Creatinine, Ser: 0.7 mg/dL (ref 0.50–1.10)
GFR calc Af Amer: >90 mL/min (ref >90)
GFR calc non Af Amer: >90 mL/min (ref >90)
Glucose, Bld: 405 mg/dL — ABNORMAL HIGH (ref 70–99)
Potassium: 4.4 mEq/L (ref 3.5–5.1)
Sodium: 138 mEq/L (ref 135–145)

## 2012-10-04 LAB — GLUCOSE, CAPILLARY
Glucose-Capillary: 363 mg/dL — ABNORMAL HIGH (ref 70–99)
Glucose-Capillary: 193 mg/dL — ABNORMAL HIGH (ref 70–99)
Glucose-Capillary: 302 mg/dL — ABNORMAL HIGH (ref 70–99)

## 2012-10-04 LAB — CBC
HCT: 41.7 % (ref 36.0–46.0)
Hemoglobin: 13.7 g/dL (ref 12.0–15.0)
MCH: 28.9 pg (ref 26.0–34.0)
MCHC: 32.9 g/dL (ref 30.0–36.0)
MCV: 88 fL (ref 78.0–100.0)
Platelets: 229 10*3/uL (ref 150–400)
RBC: 4.74 MIL/uL (ref 3.87–5.11)
RDW: 15.8 % — ABNORMAL HIGH (ref 11.5–15.5)
WBC: 9.6 10*3/uL (ref 4.0–10.5)

## 2012-10-04 LAB — PRO B NATRIURETIC PEPTIDE: Pro B Natriuretic peptide (BNP): 94.1 pg/mL (ref 0–125)

## 2012-10-04 LAB — TSH: TSH: 3.199 u[IU]/mL (ref 0.350–4.500)

## 2012-10-04 LAB — MAGNESIUM: Magnesium: 2.4 mg/dL (ref 1.5–2.5)

## 2012-10-04 MED ORDER — AZITHROMYCIN 250 MG PO TABS
500.0000 mg | ORAL_TABLET | ORAL | Status: DC
  Administered 2012-10-04 – 2012-10-05 (×2): 500 mg via ORAL
  Filled 2012-10-04 (×2): qty 2

## 2012-10-04 MED ORDER — TRAZODONE HCL 50 MG PO TABS
25.0000 mg | ORAL_TABLET | Freq: Every day | ORAL | Status: DC
  Administered 2012-10-04 (×2): 25 mg via ORAL
  Filled 2012-10-04 (×2): qty 1

## 2012-10-04 MED ORDER — BIOTENE DRY MOUTH MT LIQD
15.0000 mL | Freq: Two times a day (BID) | OROMUCOSAL | Status: DC
  Administered 2012-10-05: 15 mL via OROMUCOSAL

## 2012-10-04 MED ORDER — PAROXETINE HCL 20 MG PO TABS
30.0000 mg | ORAL_TABLET | ORAL | Status: DC
Start: 2012-10-04 — End: 2012-10-05
  Administered 2012-10-04 – 2012-10-05 (×2): 30 mg via ORAL
  Filled 2012-10-04 (×2): qty 2

## 2012-10-04 MED ORDER — ALBUTEROL SULFATE (5 MG/ML) 0.5% IN NEBU: 2.5000 mg | INHALATION_SOLUTION | RESPIRATORY_TRACT | Status: DC | PRN

## 2012-10-04 MED ORDER — POLYETHYLENE GLYCOL 3350 17 G PO PACK
17.0000 g | PACK | Freq: Every day | ORAL | Status: DC
  Administered 2012-10-04 – 2012-10-05 (×2): 17 g via ORAL
  Filled 2012-10-04 (×2): qty 1

## 2012-10-04 MED ORDER — BISACODYL 10 MG RE SUPP
10.0000 mg | Freq: Every day | RECTAL | Status: DC | PRN
  Administered 2012-10-05: 10 mg via RECTAL
  Filled 2012-10-04: qty 1

## 2012-10-04 MED ORDER — GUAIFENESIN ER 600 MG PO TB12
1200.0000 mg | ORAL_TABLET | Freq: Two times a day (BID) | ORAL | Status: DC
  Administered 2012-10-04 – 2012-10-05 (×4): 1200 mg via ORAL
  Filled 2012-10-04 (×4): qty 2

## 2012-10-04 MED ORDER — POLYETHYLENE GLYCOL 3350 17 GM/SCOOP PO POWD
17.0000 g | Freq: Every day | ORAL | Status: DC
  Filled 2012-10-04: qty 255

## 2012-10-04 MED ORDER — FLEET ENEMA 7-19 GM/118ML RE ENEM: 1.0000 | ENEMA | Freq: Once | RECTAL | Status: AC | PRN

## 2012-10-04 MED ORDER — ATORVASTATIN CALCIUM 20 MG PO TABS
20.0000 mg | ORAL_TABLET | Freq: Every day | ORAL | Status: DC
  Administered 2012-10-04: 20 mg via ORAL
  Filled 2012-10-04: qty 1

## 2012-10-04 MED ORDER — MOMETASONE FURO-FORMOTEROL FUM 100-5 MCG/ACT IN AERO
2.0000 | INHALATION_SPRAY | Freq: Two times a day (BID) | RESPIRATORY_TRACT | Status: DC
  Administered 2012-10-04 – 2012-10-05 (×3): 2 via RESPIRATORY_TRACT
  Filled 2012-10-04: qty 8.8

## 2012-10-04 MED ORDER — ONDANSETRON HCL 4 MG/2ML IJ SOLN: 4.0000 mg | INTRAMUSCULAR | Status: DC | PRN

## 2012-10-04 MED ORDER — ALPRAZOLAM 0.5 MG PO TABS
0.5000 mg | ORAL_TABLET | Freq: Three times a day (TID) | ORAL | Status: DC | PRN
  Administered 2012-10-04 (×2): 0.5 mg via ORAL
  Filled 2012-10-04 (×2): qty 1

## 2012-10-04 MED ORDER — METHYLPREDNISOLONE SODIUM SUCC 40 MG IJ SOLR
40.0000 mg | Freq: Four times a day (QID) | INTRAMUSCULAR | Status: DC
  Administered 2012-10-04 – 2012-10-05 (×5): 40 mg via INTRAVENOUS
  Filled 2012-10-04 (×4): qty 1

## 2012-10-04 MED ORDER — ALBUTEROL SULFATE (5 MG/ML) 0.5% IN NEBU
2.5000 mg | INHALATION_SOLUTION | RESPIRATORY_TRACT | Status: DC
  Administered 2012-10-04 – 2012-10-05 (×8): 2.5 mg via RESPIRATORY_TRACT
  Filled 2012-10-04 (×8): qty 0.5

## 2012-10-04 MED ORDER — FLUTICASONE PROPIONATE 50 MCG/ACT NA SUSP
1.0000 | Freq: Every day | NASAL | Status: DC
  Administered 2012-10-04 – 2012-10-05 (×2): 1 via NASAL
  Filled 2012-10-04: qty 16

## 2012-10-04 MED ORDER — MORPHINE SULFATE 2 MG/ML IJ SOLN: 1.0000 mg | INTRAMUSCULAR | Status: DC | PRN

## 2012-10-04 MED ORDER — RISAQUAD PO CAPS
1.0000 | ORAL_CAPSULE | Freq: Every day | ORAL | Status: DC
  Administered 2012-10-04 – 2012-10-05 (×2): 1 via ORAL
  Filled 2012-10-04 (×2): qty 1

## 2012-10-04 MED ORDER — POTASSIUM CHLORIDE IN NACL 20-0.9 MEQ/L-% IV SOLN
INTRAVENOUS | Status: DC
  Administered 2012-10-04 (×2): via INTRAVENOUS

## 2012-10-04 MED ORDER — METHYLPREDNISOLONE SODIUM SUCC 125 MG IJ SOLR
125.0000 mg | Freq: Four times a day (QID) | INTRAMUSCULAR | Status: DC
  Administered 2012-10-04: 125 mg via INTRAVENOUS
  Filled 2012-10-04: qty 2

## 2012-10-04 MED ORDER — SODIUM CHLORIDE 0.9 % IJ SOLN
3.0000 mL | Freq: Two times a day (BID) | INTRAMUSCULAR | Status: DC
  Administered 2012-10-04 – 2012-10-05 (×2): 3 mL via INTRAVENOUS

## 2012-10-04 MED ORDER — INSULIN ASPART 100 UNIT/ML ~~LOC~~ SOLN
0.0000 [IU] | Freq: Every day | SUBCUTANEOUS | Status: DC
  Administered 2012-10-04: 4 [IU] via SUBCUTANEOUS

## 2012-10-04 MED ORDER — ACETAMINOPHEN 325 MG PO TABS: 650.0000 mg | ORAL_TABLET | ORAL | Status: DC | PRN

## 2012-10-04 MED ORDER — GABAPENTIN 300 MG PO CAPS
300.0000 mg | ORAL_CAPSULE | Freq: Two times a day (BID) | ORAL | Status: DC
  Administered 2012-10-04 – 2012-10-05 (×4): 300 mg via ORAL
  Filled 2012-10-04 (×4): qty 1

## 2012-10-04 MED ORDER — LORATADINE 10 MG PO TABS
10.0000 mg | ORAL_TABLET | Freq: Every day | ORAL | Status: DC
  Administered 2012-10-04 – 2012-10-05 (×2): 10 mg via ORAL
  Filled 2012-10-04 (×2): qty 1

## 2012-10-04 MED ORDER — ENOXAPARIN SODIUM 40 MG/0.4ML ~~LOC~~ SOLN
40.0000 mg | SUBCUTANEOUS | Status: DC
  Administered 2012-10-04 – 2012-10-05 (×2): 40 mg via SUBCUTANEOUS
  Filled 2012-10-04 (×2): qty 0.4

## 2012-10-04 MED ORDER — INSULIN ASPART 100 UNIT/ML ~~LOC~~ SOLN
0.0000 [IU] | Freq: Three times a day (TID) | SUBCUTANEOUS | Status: DC
Start: 2012-10-04 — End: 2012-10-05
  Administered 2012-10-04: 4 [IU] via SUBCUTANEOUS
  Administered 2012-10-04: 20 [IU] via SUBCUTANEOUS
  Administered 2012-10-05 (×2): 11 [IU] via SUBCUTANEOUS

## 2012-10-04 MED ORDER — IPRATROPIUM BROMIDE 0.02 % IN SOLN
0.5000 mg | RESPIRATORY_TRACT | Status: DC
  Administered 2012-10-04 – 2012-10-05 (×8): 0.5 mg via RESPIRATORY_TRACT
  Filled 2012-10-04 (×8): qty 2.5

## 2012-10-04 MED ORDER — PANTOPRAZOLE SODIUM 40 MG PO TBEC
40.0000 mg | DELAYED_RELEASE_TABLET | Freq: Two times a day (BID) | ORAL | Status: DC
  Administered 2012-10-04 – 2012-10-05 (×3): 40 mg via ORAL
  Filled 2012-10-04 (×3): qty 1

## 2012-10-04 NOTE — H&P (Signed)
[facility] History and Physical  Kristin Wells  AVW:[id number]  DOB: [birth year]   DOA: 10/03/2012   PCP:   Fredirick Maudlin, MD   Chief Complaint:  Worsening shortness of breath since today  HPI: Kristin Wells is an 61 y.o. female.   Caucasian lady with a history of COPD maintained on 2 L of home oxygen,  completed a course of prednisone for COPD exacerbation a few days ago, but today worsening shortness of breath restarted, unrelieved by home nebulizer. Eventually came to [facility] and failed to improve with nebulizations in the emergency room, the hospitalist service was called to assist with management.  She was last admitted for COPD exacerbation 2 months ago.  The patient reports she had a severe episode of COPD exacerbation last year and was intubated en route to the hospital; she was very upset about it and demanded extubation as soon as possible, and hopes never to be intubated again. Nevertheless,  she would agree to be intubated if there is no other alternative.  Shortness of breath is associated with a cough productive of scant white sputum  She continues to smoke; she does have a history of a she may cardiomyopathy with fraction 25-30% in April 2013  Rewiew of Systems:  View of systems is limited because patient says she's too short of breath to focus on answering questions; She does admit to intermittent redness of the right eye of unclear cause; and a roaring in her ear as when she takes certain sleeping medications.     Past Medical History  Diagnosis Date  . COPD (chronic obstructive pulmonary disease)   . Asthma   . Bronchitis   . Anxiety   . Back pain   . Demand ischemia of myocardium 06/28/2011  . Thyroid enlargement 06/28/2011  . MI, acute, non ST segment elevation 06/29/2011    Myoview stress test revealed mild perfusion defect  . Ischemic cardiomyopathy 06/28/2011    EF 25%. Myoview stress test later showed ejection fraction of 65%  . PNA  (pneumonia) 06/29/2011  . Acute respiratory failure 06/2011    Vent dependent sec to COPD/PNA  . Hypertension   . Coronary artery disease   . Myocardial infarction   . Pneumonia     Past Surgical History  Procedure Laterality Date  . Abdominal hysterectomy    . Esophagogastroduodenoscopy  Feb 2013    Dr. Bosie Clos: normal duodenum, candida esophagitis, reactive gastropathy  . Colonoscopy  Feb 2013    Dr. Bosie Clos: internal hemorrhoids, normal view of ileum, multiple hyperplastic polyps    Medications:  HOME MEDS: Prior to Admission medications   Medication Sig Start Date End Date Taking? Authorizing Provider  acidophilus (RISAQUAD) CAPS Take 1 capsule by mouth daily. 08/02/12  Yes Fredirick Maudlin, MD  albuterol (PROVENTIL HFA;VENTOLIN HFA) 108 (90 BASE) MCG/ACT inhaler Inhale 1-2 puffs into the lungs every 6 (six) hours as needed for wheezing. 07/14/11 10/03/12 Yes Erick Blinks, MD  albuterol (PROVENTIL) (2.5 MG/3ML) 0.083% nebulizer solution Take 2.5 mg by nebulization 4 (four) times daily. 07/14/11  Yes Erick Blinks, MD  ALPRAZolam [name]) 0.5 MG tablet Take 1 tablet (0.5 mg total) by mouth 3 (three) times daily as needed for anxiety. 08/02/11  Yes Christiane Ha, MD  calcium carbonate (OS-CAL - DOSED IN MG OF ELEMENTAL CALCIUM) 1250 MG tablet Take 1 tablet by mouth daily.   Yes Historical Provider, MD  cyclobenzaprine (FLEXERIL) 5 MG tablet Take 5 mg by mouth 2 (two) times  daily as needed.    Yes Historical Provider, MD  Fluticasone-Salmeterol (ADVAIR) 250-50 MCG/DOSE AEPB Inhale 1 puff into the lungs every 12 (twelve) hours. 07/14/11  Yes Erick Blinks, MD  gabapentin (NEURONTIN) 300 MG capsule Take 300 mg by mouth 2 (two) times daily. 07/14/11  Yes Erick Blinks, MD  omeprazole (PRILOSEC) 20 MG capsule Take 20 mg by mouth 2 (two) times daily.   Yes Historical Provider, MD  oxyCODONE-acetaminophen (PERCOCET/ROXICET) 5-325 MG per tablet Take 0.5 tablets by mouth 2 (two) times daily as  needed for pain.   Yes Historical Provider, MD  PARoxetine (PAXIL) 30 MG tablet Take 1 tablet (30 mg total) by mouth every morning. 07/14/11  Yes Erick Blinks, MD  rosuvastatin (CRESTOR) 10 MG tablet Take 10 mg by mouth daily.   Yes Historical Provider, MD  tiotropium (SPIRIVA) 18 MCG inhalation capsule Place 1 capsule (18 mcg total) into inhaler and inhale daily. 07/14/11  Yes Erick Blinks, MD  traZODone (DESYREL) 50 MG tablet Take 25 mg by mouth at bedtime.   Yes Historical Provider, MD  Vitamin D, Ergocalciferol, (DRISDOL) 50000 UNITS CAPS Take 50,000 Units by mouth every 7 (seven) days. Takes every Monday   Yes Historical Provider, MD  cetirizine (ZYRTEC) 10 MG tablet Take 10 mg by mouth daily as needed for allergies.    Historical Provider, MD  fluticasone (FLONASE) 50 MCG/ACT nasal spray Place 1-2 sprays into the nose daily.  06/18/12   Historical Provider, MD  polyethylene glycol powder (GLYCOLAX/MIRALAX) powder Take 17 g by mouth daily. 07/14/11   Erick Blinks, MD     Allergies:  No Known Allergies  Social History:   reports that she has been smoking Cigarettes.  She has been smoking about 0.00 packs per day. She has never used smokeless tobacco. She reports that she does not drink alcohol or use illicit drugs.  Family History: Family History  Problem Relation Age of Onset  . Diabetes Mother   . Diabetes Father   . Hypertension Mother   . Hypertension Father   . Colon cancer Neg Hx      Physical Exam: Filed Vitals:   10/03/12 2114 10/03/12 2142 10/03/12 2200 10/03/12 2300  BP: 129/77  123/74 145/85  Pulse: 116  112 116  Temp:    97.9 F (36.6 C)  TempSrc:    Oral  Resp: 29     Height:    5\' 1"  (1.549 m)  Weight:      SpO2: 94% 97% 98% 94%   Blood pressure 145/85, pulse 116, temperature 97.9 F (36.6 C), temperature source Oral, resp. rate 29, height 5\' 1"  (1.549 m), weight 63.504 kg (140 lb), SpO2 94.00%.  GEN:  Pleasant middle-aged Caucasian lady sitting up in bed  breathing rapidly; but able to speak in sentences. cooperative with exam PSYCH:  alert and oriented x4;  somewhat anxious; affect is appropriate. HEENT: Mucous membranes pink and anicteric; PERRLA; EOM intact; no cervical lymphadenopathy nor thyromegaly or carotid bruit; no JVD; Breasts:: Not examined CHEST WALL: No tenderness CHEST: Prolonged expiration, poor air movement relatively silent chest HEART: Regular rate and rhythm; no murmurs rubs or gallops BACK: no scoliosis; no CVA tenderness ABDOMEN: Obese, soft non-tender; no masses, no organomegaly, normal abdominal bowel sounds; no intertriginous candida. Rectal Exam: Not done EXTREMITIES: age-appropriate arthropathy of the hands and knees; trace edema; no ulcerations. Genitalia: not examined PULSES: 2+ and symmetric SKIN: Normal hydration no rash or ulceration CNS: Cranial nerves 2-12 grossly intact no focal lateralizing  neurologic deficit   Labs on Admission:  Basic Metabolic Panel:  Recent Labs Lab 10/03/12 2051  NA 143  K 3.7  CL 102  CO2 35*  GLUCOSE 153*  BUN 7  CREATININE 0.80  CALCIUM 9.6   Liver Function Tests: No results found for this basename: AST, ALT, ALKPHOS, BILITOT, PROT, ALBUMIN,  in the last 168 hours No results found for this basename: LIPASE, AMYLASE,  in the last 168 hours No results found for this basename: AMMONIA,  in the last 168 hours CBC:  Recent Labs Lab 10/03/12 2111  WBC 9.4  NEUTROABS 5.1  HGB 14.4  HCT 44.2  MCV 88.8  PLT 255   Cardiac Enzymes: No results found for this basename: CKTOTAL, CKMB, CKMBINDEX, TROPONINI,  in the last 168 hours BNP: No components found with this basename: POCBNP,  D-dimer: No components found with this basename: D-DIMER,  CBG: No results found for this basename: GLUCAP,  in the last 168 hours  Radiological Exams on Admission: Dg Chest Portable 1 View  10/03/2012   *RADIOLOGY REPORT*  Clinical Data: Shortness of breath and bronchitis.  PORTABLE  CHEST - 1 VIEW  Comparison: 07/30/2012  Findings: Stable mild chronic lung disease.  Heart size is normal. No edema or infiltrate identified.  No evidence of pleural effusion.  IMPRESSION: No active disease.  Stable chronic lung disease.   Original Report Authenticated By: Irish Lack, M.D.       Assessment/Plan Present on Admission:  . COPD with acute exacerbation . Chronic respiratory failure . Systolic CHF, chronic . Hyperglycemia, likely secondary to recent steroid   PLAN: We'll admit this lady for aggressive treatment of her COPD exacerbation including intravenous steroids and serial nebulization with albuterol and ipratropium; continue supplemental oxygen.  Followup with primary care physician/pulmonologist in the morning  Other plans as per orders.  Code Status: Full code Family Communication: Plans discussed with patient at bedside Disposition Plan: Likely home in a few days  Layken Beg Nocturnist [facility] Pager [phone]   10/04/2012, 12:38 AM

## 2012-10-04 NOTE — Progress Notes (Signed)
UR complete.  Phylisha Dix RN, MSN 

## 2012-10-04 NOTE — Progress Notes (Signed)
Patient blood glucose 405 this AM. Dr. Juanetta Gosling notified.

## 2012-10-04 NOTE — Progress Notes (Signed)
Subjective: She says she's breathing better. She is frustrated about her home situation.  Objective: Vital signs in last 24 hours: Temp:  [97.4 F (36.3 C)-98.2 F (36.8 C)] 97.4 F (36.3 C) (05/29 0438) Pulse Rate:  [112-125] 118 (05/29 0438) Resp:  [22-33] 22 (05/29 0438) BP: (101-159)/(64-85) 104/64 mmHg (05/29 0438) SpO2:  [91 %-98 %] 94 % (05/29 0807) Weight:  [63.504 kg (140 lb)-68.7 kg (151 lb 7.3 oz)] 68.7 kg (151 lb 7.3 oz) (05/29 0438) Weight change:  Last BM Date: 10/02/12  Intake/Output from previous day: 05/28 0701 - 05/29 0700 In: 251.7 [I.V.:251.7] Out: 1000 [Urine:1000]  PHYSICAL EXAM General appearance: alert, cooperative and mild distress Resp: rhonchi bilaterally Cardio: regular rate and rhythm, S1, S2 normal, no murmur, click, rub or gallop GI: soft, non-tender; bowel sounds normal; no masses,  no organomegaly Extremities: extremities normal, atraumatic, no cyanosis or edema  Lab Results:    Basic Metabolic Panel:  Recent Labs  19/14/78 2051 10/04/12 0606  NA 143 138  K 3.7 4.4  CL 102 101  CO2 35* 27  GLUCOSE 153* 405*  BUN 7 12  CREATININE 0.80 0.70  CALCIUM 9.6 8.7  MG  --  2.4   Liver Function Tests: No results found for this basename: AST, ALT, ALKPHOS, BILITOT, PROT, ALBUMIN,  in the last 72 hours No results found for this basename: LIPASE, AMYLASE,  in the last 72 hours No results found for this basename: AMMONIA,  in the last 72 hours CBC:  Recent Labs  10/03/12 2111 10/04/12 0606  WBC 9.4 9.6  NEUTROABS 5.1  --   HGB 14.4 13.7  HCT 44.2 41.7  MCV 88.8 88.0  PLT 255 229   Cardiac Enzymes: No results found for this basename: CKTOTAL, CKMB, CKMBINDEX, TROPONINI,  in the last 72 hours BNP:  Recent Labs  10/04/12 0607  PROBNP 94.1   D-Dimer: No results found for this basename: DDIMER,  in the last 72 hours CBG: No results found for this basename: GLUCAP,  in the last 72 hours Hemoglobin A1C: No results found for  this basename: HGBA1C,  in the last 72 hours Fasting Lipid Panel: No results found for this basename: CHOL, HDL, LDLCALC, TRIG, CHOLHDL, LDLDIRECT,  in the last 72 hours Thyroid Function Tests: No results found for this basename: TSH, T4TOTAL, FREET4, T3FREE, THYROIDAB,  in the last 72 hours Anemia Panel: No results found for this basename: VITAMINB12, FOLATE, FERRITIN, TIBC, IRON, RETICCTPCT,  in the last 72 hours Coagulation: No results found for this basename: LABPROT, INR,  in the last 72 hours Urine Drug Screen: Drugs of Abuse     Component Value Date/Time   LABOPIA NONE DETECTED 07/30/2011 0452   COCAINSCRNUR NONE DETECTED 07/30/2011 0452   LABBENZ POSITIVE* 07/30/2011 0452   AMPHETMU NONE DETECTED 07/30/2011 0452   THCU NONE DETECTED 07/30/2011 0452   LABBARB NONE DETECTED 07/30/2011 0452    Alcohol Level: No results found for this basename: ETH,  in the last 72 hours Urinalysis:  Recent Labs  10/03/12 2150  COLORURINE YELLOW  LABSPEC <1.005*  PHURINE 6.0  GLUCOSEU NEGATIVE  HGBUR SMALL*  BILIRUBINUR NEGATIVE  KETONESUR NEGATIVE  PROTEINUR NEGATIVE  UROBILINOGEN 0.2  NITRITE NEGATIVE  LEUKOCYTESUR NEGATIVE   Misc. Labs:  ABGS  Recent Labs  10/03/12 2142  PHART 7.320*  PO2ART 70.2*  TCO2 25.5  HCO3 28.8*   CULTURES No results found for this or any previous visit (from the past 240 hour(s)). Studies/Results: Dg Chest  Portable 1 View  10/03/2012   *RADIOLOGY REPORT*  Clinical Data: Shortness of breath and bronchitis.  PORTABLE CHEST - 1 VIEW  Comparison: 07/30/2012  Findings: Stable mild chronic lung disease.  Heart size is normal. No edema or infiltrate identified.  No evidence of pleural effusion.  IMPRESSION: No active disease.  Stable chronic lung disease.   Original Report Authenticated By: Irish Lack, M.D.    Medications:  Prior to Admission:  Prescriptions prior to admission  Medication Sig Dispense Refill  . acidophilus (RISAQUAD) CAPS Take 1  capsule by mouth daily.  30 capsule  5  . albuterol (PROVENTIL HFA;VENTOLIN HFA) 108 (90 BASE) MCG/ACT inhaler Inhale 1-2 puffs into the lungs every 6 (six) hours as needed for wheezing.      Marland Kitchen albuterol (PROVENTIL) (2.5 MG/3ML) 0.083% nebulizer solution Take 2.5 mg by nebulization 4 (four) times daily.      Marland Kitchen ALPRAZolam (XANAX) 0.5 MG tablet Take 1 tablet (0.5 mg total) by mouth 3 (three) times daily as needed for anxiety.  15 tablet  0  . calcium carbonate (OS-CAL - DOSED IN MG OF ELEMENTAL CALCIUM) 1250 MG tablet Take 1 tablet by mouth daily.      . cyclobenzaprine (FLEXERIL) 5 MG tablet Take 5 mg by mouth 2 (two) times daily as needed.       . Fluticasone-Salmeterol (ADVAIR) 250-50 MCG/DOSE AEPB Inhale 1 puff into the lungs every 12 (twelve) hours.  60 each  1  . gabapentin (NEURONTIN) 300 MG capsule Take 300 mg by mouth 2 (two) times daily.      Marland Kitchen omeprazole (PRILOSEC) 20 MG capsule Take 20 mg by mouth 2 (two) times daily.      Marland Kitchen oxyCODONE-acetaminophen (PERCOCET/ROXICET) 5-325 MG per tablet Take 0.5 tablets by mouth 2 (two) times daily as needed for pain.      Marland Kitchen PARoxetine (PAXIL) 30 MG tablet Take 1 tablet (30 mg total) by mouth every morning.  30 tablet  1  . rosuvastatin (CRESTOR) 10 MG tablet Take 10 mg by mouth daily.      Marland Kitchen tiotropium (SPIRIVA) 18 MCG inhalation capsule Place 1 capsule (18 mcg total) into inhaler and inhale daily.  30 capsule  1  . traZODone (DESYREL) 50 MG tablet Take 25 mg by mouth at bedtime.      . Vitamin D, Ergocalciferol, (DRISDOL) 50000 UNITS CAPS Take 50,000 Units by mouth every 7 (seven) days. Takes every Monday      . cetirizine (ZYRTEC) 10 MG tablet Take 10 mg by mouth daily as needed for allergies.      . fluticasone (FLONASE) 50 MCG/ACT nasal spray Place 1-2 sprays into the nose daily.       . polyethylene glycol powder (GLYCOLAX/MIRALAX) powder Take 17 g by mouth daily.  255 g  1   Scheduled: . acidophilus  1 capsule Oral Daily  . albuterol  2.5 mg  Nebulization Q4H WA  . [START ON 10/05/2012] antiseptic oral rinse  15 mL Mouth Rinse BID  . atorvastatin  20 mg Oral q1800  . azithromycin  500 mg Oral Q24H  . enoxaparin (LOVENOX) injection  40 mg Subcutaneous Q24H  . fluticasone  1 spray Each Nare Daily  . gabapentin  300 mg Oral BID  . guaiFENesin  1,200 mg Oral BID  . ipratropium  0.5 mg Nebulization Q4H WA  . loratadine  10 mg Oral Daily  . methylPREDNISolone (SOLU-MEDROL) injection  125 mg Intravenous Q6H  . mometasone-formoterol  2 puff  Inhalation BID  . pantoprazole  40 mg Oral BID AC  . PARoxetine  30 mg Oral BH-q7a  . polyethylene glycol  17 g Oral Daily  . sodium chloride  3 mL Intravenous Q12H  . traZODone  25 mg Oral QHS   Continuous: . 0.9 % NaCl with KCl 20 mEq / L 50 mL/hr at 10/04/12 0117   YQM:[id number], albuterol, ALPRAZolam, bisacodyl, morphine injection, ondansetron (ZOFRAN) IV, sodium phosphate  Assesment: She was admitted with COPD exacerbation. She has chronic respiratory failure. She has chronic systolic congestive heart failure but that does not appear to be part of her current problem. She has frustrations about her home situation and I think that may have precipitated some of her Active Problems:   Hyperglycemia   Systolic CHF, chronic   COPD with acute exacerbation   Chronic respiratory failure    Plan: Continue current treatments. She is much improved and I think she'll probably be able to go home tomorrow    LOS: 1 day   Lavonn Maxcy L 10/04/2012, 8:56 AM

## 2012-10-04 NOTE — Progress Notes (Signed)
Inpatient Diabetes Program Recommendations  AACE/ADA: New Consensus Statement on Inpatient Glycemic Control (2013)  Target Ranges:  Prepandial:   less than 140 mg/dL      Peak postprandial:   less than 180 mg/dL (1-2 hours)      Critically ill patients:  140 - 180 mg/dL    Results for LAKESA, COSTE (MRN [id number]) as of 10/04/2012 08:14  Ref. Range 10/03/2012 20:51 10/04/2012 06:06  Glucose Latest Range: 70-99 mg/dL 469 (H) 629 (H)   Inpatient Diabetes Program Recommendations Correction (SSI): Please consider ordering Novolog correction ACHS while inpatient and on steroids.  Note: Patient does not have a documented history of diabetes.  Initial lab glucose 153 mg/dl on 10/04/39 @ 32:44 and fasting blood glucose this morning was 405 mg/dl.  Note that patient is receiving Solumedrol 125 mg IV Q6H and A1C is pending.  Please consider ordering Novolog correction ACHS while inpatient and on high dose steroids.  Will continue to follow.  Thanks, Orlando Penner, RN, MSN, CCRN Diabetes Coordinator Inpatient Diabetes Program [phone]

## 2012-10-05 DIAGNOSIS — E119 Type 2 diabetes mellitus without complications: Secondary | ICD-10-CM | POA: Diagnosis present

## 2012-10-05 LAB — GLUCOSE, CAPILLARY
Glucose-Capillary: 282 mg/dL — ABNORMAL HIGH (ref 70–99)
Glucose-Capillary: 274 mg/dL — ABNORMAL HIGH (ref 70–99)

## 2012-10-05 MED ORDER — METFORMIN HCL 500 MG PO TABS: 500.0000 mg | ORAL_TABLET | Freq: Two times a day (BID) | ORAL | Status: DC

## 2012-10-05 MED ORDER — ALPRAZOLAM 0.5 MG PO TABS: 0.5000 mg | ORAL_TABLET | Freq: Four times a day (QID) | ORAL | Status: DC | PRN

## 2012-10-05 MED ORDER — AZITHROMYCIN 250 MG PO TABS: ORAL_TABLET | ORAL | Status: AC

## 2012-10-05 MED ORDER — METHYLPREDNISOLONE 4 MG PO KIT: PACK | ORAL | Status: DC

## 2012-10-05 NOTE — Care Management Note (Signed)
    Page 1 of 1   10/05/2012     10:49:14 AM   CARE MANAGEMENT NOTE 10/05/2012  Patient:  Kristin Wells, Kristin Wells   Account Number:  [id number]  Date Initiated:  10/05/2012  Documentation initiated by:  Sharrie Rothman  Subjective/Objective Assessment:   PT admitted from home with COPD. Pt lives alone but has a CAP aide 7 days a week/2 hours a day. Pt has home O2, rollator, cane, and neb machine. Pt is followed by [facility].     Action/Plan:   Pt denies any need for HH at discharge. [facility] is aware of discharge today.   Anticipated DC Date:  10/05/2012   Anticipated DC Plan:  HOME/SELF CARE      DC Planning Services  CM consult      Choice offered to / List presented to:             Status of service:  Completed, signed off Medicare Important Message given?  YES (If response is "NO", the following Medicare IM given date fields will be blank) Date Medicare IM given:  10/05/2012 Date Additional Medicare IM given:    Discharge Disposition:  HOME/SELF CARE  Per UR Regulation:    If discussed at Long Length of Stay Meetings, dates discussed:    Comments:  10/05/12 1040 Arlyss Queen, RN BSN CM

## 2012-10-05 NOTE — Progress Notes (Signed)
Subjective: She feels better. Her blood sugar has been up. She checks her blood sugar at home but is not on medications. Her breathing is at baseline. She is somewhat hoarse. Her anxiety is somewhat better  Objective: Vital signs in last 24 hours: Temp:  [97.4 F (36.3 C)-98.3 F (36.8 C)] 97.4 F (36.3 C) (05/30 0516) Pulse Rate:  [105-117] 105 (05/30 0516) Resp:  [20-22] 20 (05/30 0516) BP: (115-138)/(70-79) 116/76 mmHg (05/30 0516) SpO2:  [93 %-100 %] 95 % (05/30 0722) Weight:  [70.806 kg (156 lb 1.6 oz)] 70.806 kg (156 lb 1.6 oz) (05/30 0516) Weight change: 7.303 kg (16 lb 1.6 oz) Last BM Date: 10/02/12  Intake/Output from previous day: 05/29 0701 - 05/30 0700 In: 2094.2 [P.O.:960; I.V.:1134.2] Out: 3000 [Urine:3000]  PHYSICAL EXAM General appearance: alert, cooperative and no distress Resp: clear to auscultation bilaterally Cardio: regular rate and rhythm, S1, S2 normal, no murmur, click, rub or gallop GI: soft, non-tender; bowel sounds normal; no masses,  no organomegaly Extremities: extremities normal, atraumatic, no cyanosis or edema  Lab Results:    Basic Metabolic Panel:  Recent Labs  16/10/96 2051 10/04/12 0606  NA 143 138  K 3.7 4.4  CL 102 101  CO2 35* 27  GLUCOSE 153* 405*  BUN 7 12  CREATININE 0.80 0.70  CALCIUM 9.6 8.7  MG  --  2.4   Liver Function Tests: No results found for this basename: AST, ALT, ALKPHOS, BILITOT, PROT, ALBUMIN,  in the last 72 hours No results found for this basename: LIPASE, AMYLASE,  in the last 72 hours No results found for this basename: AMMONIA,  in the last 72 hours CBC:  Recent Labs  10/03/12 2111 10/04/12 0606  WBC 9.4 9.6  NEUTROABS 5.1  --   HGB 14.4 13.7  HCT 44.2 41.7  MCV 88.8 88.0  PLT 255 229   Cardiac Enzymes: No results found for this basename: CKTOTAL, CKMB, CKMBINDEX, TROPONINI,  in the last 72 hours BNP:  Recent Labs  10/04/12 0607  PROBNP 94.1   D-Dimer: No results found for this  basename: DDIMER,  in the last 72 hours CBG:  Recent Labs  10/04/12 1131 10/04/12 1646 10/04/12 2057 10/05/12 0821  GLUCAP 363* 193* 302* 282*   Hemoglobin A1C:  Recent Labs  10/04/12 0032  HGBA1C 6.1*   Fasting Lipid Panel: No results found for this basename: CHOL, HDL, LDLCALC, TRIG, CHOLHDL, LDLDIRECT,  in the last 72 hours Thyroid Function Tests:  Recent Labs  10/04/12 0032  TSH 3.199   Anemia Panel: No results found for this basename: VITAMINB12, FOLATE, FERRITIN, TIBC, IRON, RETICCTPCT,  in the last 72 hours Coagulation: No results found for this basename: LABPROT, INR,  in the last 72 hours Urine Drug Screen: Drugs of Abuse     Component Value Date/Time   LABOPIA NONE DETECTED 07/30/2011 0452   COCAINSCRNUR NONE DETECTED 07/30/2011 0452   LABBENZ POSITIVE* 07/30/2011 0452   AMPHETMU NONE DETECTED 07/30/2011 0452   THCU NONE DETECTED 07/30/2011 0452   LABBARB NONE DETECTED 07/30/2011 0452    Alcohol Level: No results found for this basename: ETH,  in the last 72 hours Urinalysis:  Recent Labs  10/03/12 2150  COLORURINE YELLOW  LABSPEC <1.005*  PHURINE 6.0  GLUCOSEU NEGATIVE  HGBUR SMALL*  BILIRUBINUR NEGATIVE  KETONESUR NEGATIVE  PROTEINUR NEGATIVE  UROBILINOGEN 0.2  NITRITE NEGATIVE  LEUKOCYTESUR NEGATIVE   Misc. Labs:  ABGS  Recent Labs  10/03/12 2142  PHART 7.320*  PO2ART  70.2*  TCO2 25.5  HCO3 28.8*   CULTURES No results found for this or any previous visit (from the past 240 hour(s)). Studies/Results: Dg Chest Portable 1 View  10/03/2012   *RADIOLOGY REPORT*  Clinical Data: Shortness of breath and bronchitis.  PORTABLE CHEST - 1 VIEW  Comparison: 07/30/2012  Findings: Stable mild chronic lung disease.  Heart size is normal. No edema or infiltrate identified.  No evidence of pleural effusion.  IMPRESSION: No active disease.  Stable chronic lung disease.   Original Report Authenticated By: Irish Lack, M.D.    Medications:  Prior  to Admission:  Prescriptions prior to admission  Medication Sig Dispense Refill  . acidophilus (RISAQUAD) CAPS Take 1 capsule by mouth daily.  30 capsule  5  . albuterol (PROVENTIL HFA;VENTOLIN HFA) 108 (90 BASE) MCG/ACT inhaler Inhale 1-2 puffs into the lungs every 6 (six) hours as needed for wheezing.      Marland Kitchen albuterol (PROVENTIL) (2.5 MG/3ML) 0.083% nebulizer solution Take 2.5 mg by nebulization 4 (four) times daily.      Marland Kitchen ALPRAZolam (XANAX) 0.5 MG tablet Take 1 tablet (0.5 mg total) by mouth 3 (three) times daily as needed for anxiety.  15 tablet  0  . calcium carbonate (OS-CAL - DOSED IN MG OF ELEMENTAL CALCIUM) 1250 MG tablet Take 1 tablet by mouth daily.      . cyclobenzaprine (FLEXERIL) 5 MG tablet Take 5 mg by mouth 2 (two) times daily as needed.       . Fluticasone-Salmeterol (ADVAIR) 250-50 MCG/DOSE AEPB Inhale 1 puff into the lungs every 12 (twelve) hours.  60 each  1  . gabapentin (NEURONTIN) 300 MG capsule Take 300 mg by mouth 2 (two) times daily.      Marland Kitchen omeprazole (PRILOSEC) 20 MG capsule Take 20 mg by mouth 2 (two) times daily.      Marland Kitchen oxyCODONE-acetaminophen (PERCOCET/ROXICET) 5-325 MG per tablet Take 0.5 tablets by mouth 2 (two) times daily as needed for pain.      Marland Kitchen PARoxetine (PAXIL) 30 MG tablet Take 1 tablet (30 mg total) by mouth every morning.  30 tablet  1  . rosuvastatin (CRESTOR) 10 MG tablet Take 10 mg by mouth daily.      Marland Kitchen tiotropium (SPIRIVA) 18 MCG inhalation capsule Place 1 capsule (18 mcg total) into inhaler and inhale daily.  30 capsule  1  . traZODone (DESYREL) 50 MG tablet Take 25 mg by mouth at bedtime.      . Vitamin D, Ergocalciferol, (DRISDOL) 50000 UNITS CAPS Take 50,000 Units by mouth every 7 (seven) days. Takes every Monday      . cetirizine (ZYRTEC) 10 MG tablet Take 10 mg by mouth daily as needed for allergies.      . fluticasone (FLONASE) 50 MCG/ACT nasal spray Place 1-2 sprays into the nose daily.       . polyethylene glycol powder (GLYCOLAX/MIRALAX)  powder Take 17 g by mouth daily.  255 g  1   Scheduled: . acidophilus  1 capsule Oral Daily  . albuterol  2.5 mg Nebulization Q4H WA  . antiseptic oral rinse  15 mL Mouth Rinse BID  . atorvastatin  20 mg Oral q1800  . azithromycin  500 mg Oral Q24H  . enoxaparin (LOVENOX) injection  40 mg Subcutaneous Q24H  . fluticasone  1 spray Each Nare Daily  . gabapentin  300 mg Oral BID  . guaiFENesin  1,200 mg Oral BID  . insulin aspart  0-20 Units Subcutaneous TID  WC  . insulin aspart  0-5 Units Subcutaneous QHS  . ipratropium  0.5 mg Nebulization Q4H WA  . loratadine  10 mg Oral Daily  . methylPREDNISolone (SOLU-MEDROL) injection  40 mg Intravenous Q6H  . mometasone-formoterol  2 puff Inhalation BID  . pantoprazole  40 mg Oral BID AC  . PARoxetine  30 mg Oral BH-q7a  . polyethylene glycol  17 g Oral Daily  . sodium chloride  3 mL Intravenous Q12H  . traZODone  25 mg Oral QHS   Continuous: . 0.9 % NaCl with KCl 20 mEq / L 50 mL/hr at 10/04/12 2119   ZOX:[id number], albuterol, ALPRAZolam, bisacodyl, morphine injection, ondansetron (ZOFRAN) IV  Assesment: She was admitted with COPD exacerbation. She has chronic respiratory failure with an acute exacerbation. She has systolic heart failure which seems to be stable. She has diabetes. She has severe anxiety and depression. I think her COPD exacerbation was at least partially precipitated by an anxiety attack she is much improved however I think she's ready for discharge Active Problems:   Hyperglycemia   Systolic CHF, chronic   COPD with acute exacerbation   Chronic respiratory failure    Plan: I will plan to discharge her home. She will need treatment for diabetes now. She will have increased availability of medications for anxiety.    LOS: 2 days   Jalan Fariss L 10/05/2012, 8:45 AM

## 2012-10-05 NOTE — Discharge Summary (Signed)
Physician Discharge Summary  Patient ID: Kristin Wells MRN: [id number] DOB/AGE: [birth year] 62 y.o. Primary Care Physician:Tekoa Amon L, MD Admit date: 10/03/2012 Discharge date: 10/05/2012    Discharge Diagnoses:   Active Problems:   DEPRESSION   Acute respiratory failure   Hyperglycemia   HTN (hypertension)   Anxiety disorder   Chronic back pain   Systolic CHF, chronic   COPD with acute exacerbation   Chronic respiratory failure   Diabetes     Medication List    TAKE these medications       acidophilus Caps  Take 1 capsule by mouth daily.     albuterol 108 (90 BASE) MCG/ACT inhaler  Commonly known as:  PROVENTIL HFA;VENTOLIN HFA  Inhale 1-2 puffs into the lungs every 6 (six) hours as needed for wheezing.     albuterol (2.5 MG/3ML) 0.083% nebulizer solution  Commonly known as:  PROVENTIL  Take 2.5 mg by nebulization 4 (four) times daily.     ALPRAZolam 0.5 MG tablet  Commonly known as:  XANAX  Take 1 tablet (0.5 mg total) by mouth 4 (four) times daily as needed for anxiety.     azithromycin 250 MG tablet  Commonly known as:  ZITHROMAX Z-PAK  Take 2 tablets (500 mg) on  Day 1,  followed by 1 tablet (250 mg) once daily on Days 2 through 5.     calcium carbonate 1250 MG tablet  Commonly known as:  OS-CAL - dosed in mg of elemental calcium  Take 1 tablet by mouth daily.     cetirizine 10 MG tablet  Commonly known as:  ZYRTEC  Take 10 mg by mouth daily as needed for allergies.     cyclobenzaprine 5 MG tablet  Commonly known as:  FLEXERIL  Take 5 mg by mouth 2 (two) times daily as needed.     fluticasone 50 MCG/ACT nasal spray  Commonly known as:  FLONASE  Place 1-2 sprays into the nose daily.     Fluticasone-Salmeterol 250-50 MCG/DOSE Aepb  Commonly known as:  ADVAIR  Inhale 1 puff into the lungs every 12 (twelve) hours.     gabapentin 300 MG capsule  Commonly known as:  NEURONTIN  Take 300 mg by mouth 2 (two) times daily.     metFORMIN 500 MG  tablet  Commonly known as:  GLUCOPHAGE  Take 1 tablet (500 mg total) by mouth 2 (two) times daily with a meal.     methylPREDNISolone 4 MG tablet  Commonly known as:  MEDROL (PAK)  follow package directions     omeprazole 20 MG capsule  Commonly known as:  PRILOSEC  Take 20 mg by mouth 2 (two) times daily.     oxyCODONE-acetaminophen 5-325 MG per tablet  Commonly known as:  PERCOCET/ROXICET  Take 0.5 tablets by mouth 2 (two) times daily as needed for pain.     PARoxetine 30 MG tablet  Commonly known as:  PAXIL  Take 1 tablet (30 mg total) by mouth every morning.     polyethylene glycol powder powder  Commonly known as:  GLYCOLAX/MIRALAX  Take 17 g by mouth daily.     rosuvastatin 10 MG tablet  Commonly known as:  CRESTOR  Take 10 mg by mouth daily.     tiotropium 18 MCG inhalation capsule  Commonly known as:  SPIRIVA  Place 1 capsule (18 mcg total) into inhaler and inhale daily.     traZODone 50 MG tablet  Commonly known as:  DESYREL  Take 25  mg by mouth at bedtime.     Vitamin D (Ergocalciferol) 50000 UNITS Caps  Commonly known as:  DRISDOL  Take 50,000 Units by mouth every 7 (seven) days. Takes every Monday        Discharged Condition: Improved    Consults: None  Significant Diagnostic Studies: Dg Chest Portable 1 View  10/03/2012   *RADIOLOGY REPORT*  Clinical Data: Shortness of breath and bronchitis.  PORTABLE CHEST - 1 VIEW  Comparison: 07/30/2012  Findings: Stable mild chronic lung disease.  Heart size is normal. No edema or infiltrate identified.  No evidence of pleural effusion.  IMPRESSION: No active disease.  Stable chronic lung disease.   Original Report Authenticated By: Irish Lack, M.D.    Lab Results: Basic Metabolic Panel:  Recent Labs  78/46/96 2051 10/04/12 0606  NA 143 138  K 3.7 4.4  CL 102 101  CO2 35* 27  GLUCOSE 153* 405*  BUN 7 12  CREATININE 0.80 0.70  CALCIUM 9.6 8.7  MG  --  2.4   Liver Function Tests: No results  found for this basename: AST, ALT, ALKPHOS, BILITOT, PROT, ALBUMIN,  in the last 72 hours   CBC:  Recent Labs  10/03/12 2111 10/04/12 0606  WBC 9.4 9.6  NEUTROABS 5.1  --   HGB 14.4 13.7  HCT 44.2 41.7  MCV 88.8 88.0  PLT 255 229    No results found for this or any previous visit (from the past 240 hour(s)).   Hospital Course: She was admitted with COPD exacerbation. She had become extremely upset with one of the people who is in her apartment complex and then developed increasing shortness of breath. She was treated in the emergency department but did not clear enough for discharge. She was also noted to have markedly elevated blood sugars. She has had elevated blood sugars in the past but not to the extent that she did during this hospitalization when her blood sugar went over 300. She was treated with sliding scale insulin she was placed on steroids on a reduced dose and given an antibiotic. She improved markedly. She was hoarse at the time of discharge but her lungs were clear she was able to ambulate in the room and generally felt better.  Discharge Exam: Blood pressure 116/76, pulse 105, temperature 97.4 F (36.3 C), temperature source Oral, resp. rate 20, height 5\' 1"  (1.549 m), weight 70.806 kg (156 lb 1.6 oz), SpO2 95.00%. Her chest is clear. She's able to ambulate. She is hoarse. Her heart is regular.  Disposition: Discharge home. She will start metformin for her diabetes. I gave her more medication for her anxiety to see if we can get better control of that.      Discharge Orders   Future Appointments Provider Department Dept Phone   10/09/2012 2:00 PM Adline Potter, NP [facility] [phone]   Future Orders Complete By Expires     Discharge patient  As directed          Signed: [place] Dalporto L Pager [phone]  10/05/2012, 9:01 AM

## 2012-10-05 NOTE — Progress Notes (Signed)
Pt discharged with prescriptions, instructions, and care notes.  She verbalized understanding.  Pt left the floor via w/c with staff in stable condition.

## 2012-10-09 ENCOUNTER — Other Ambulatory Visit (HOSPITAL_COMMUNITY)
Admission: RE | Admit: 2012-10-09 | Discharge: 2012-10-09 | Disposition: A | Discharge status: Home / Self Care | Payer: PRIVATE HEALTH INSURANCE | Source: Ambulatory Visit | Attending: Adult Health | Admitting: Adult Health

## 2012-10-09 ENCOUNTER — Ambulatory Visit (INDEPENDENT_AMBULATORY_CARE_PROVIDER_SITE_OTHER): Payer: Medicare Other | Admitting: Adult Health

## 2012-10-09 ENCOUNTER — Encounter: Payer: Self-pay | Admitting: Adult Health

## 2012-10-09 VITALS — BP 130/92 | HR 80 | Ht 60.0 in | Wt 150.0 lb

## 2012-10-09 DIAGNOSIS — Z01419 Encounter for gynecological examination (general) (routine) without abnormal findings: Secondary | ICD-10-CM | POA: Insufficient documentation

## 2012-10-09 DIAGNOSIS — Z1151 Encounter for screening for human papillomavirus (HPV): Secondary | ICD-10-CM | POA: Insufficient documentation

## 2012-10-09 DIAGNOSIS — N816 Rectocele: Secondary | ICD-10-CM

## 2012-10-09 DIAGNOSIS — Z1212 Encounter for screening for malignant neoplasm of rectum: Secondary | ICD-10-CM

## 2012-10-09 HISTORY — DX: Rectocele: N81.6

## 2012-10-09 LAB — HEMOCCULT GUIAC POC 1CARD (OFFICE): Fecal Occult Blood, POC: NEGATIVE

## 2012-10-09 LAB — HM PAP SMEAR: HM Pap smear: NEGATIVE

## 2012-10-09 NOTE — Progress Notes (Signed)
Patient ID: Kristin Wells, female   DOB: 16-Sep-1950, 62 y.o.   MRN: [id number] History of Present Illness: Kristin Wells is a 62 year old white female, divorced in for a pap and physical.  Current Medications, Allergies, Past Medical History, Past Surgical History, Family History and Social History were reviewed in [employer] [facility] electronic medical record.    Review of Systems: Patient denies any headaches, blurred vision, shortness of breath, chest pain. She has dizziness if she changes positions too fast, and she was in hospital recently. She says her stomach is too big and she had a CT in March.She does have  SUI,she is not having sex.No bleeding or vaginal discharge. She walks with a cane.    She has chronic back pain and anxiety. Past Medical History  Diagnosis Date  . COPD (chronic obstructive pulmonary disease)   . Asthma   . Bronchitis   . Anxiety   . Back pain   . Demand ischemia of myocardium 06/28/2011  . Thyroid enlargement 06/28/2011  . MI, acute, non ST segment elevation 06/29/2011    Myoview stress test revealed mild perfusion defect  . Ischemic cardiomyopathy 06/28/2011    EF 25%. Myoview stress test later showed ejection fraction of 65%  . PNA (pneumonia) 06/29/2011  . Acute respiratory failure 06/2011    Vent dependent sec to COPD/PNA  . Hypertension   . Coronary artery disease   . Myocardial infarction   . Pneumonia   . Diabetes mellitus without complication   . Arthritis    Past Surgical History  Procedure Laterality Date  . Esophagogastroduodenoscopy  Feb 2013    Dr. Bosie Clos: normal duodenum, candida esophagitis, reactive gastropathy  . Colonoscopy  Feb 2013    Dr. Bosie Clos: internal hemorrhoids, normal view of ileum, multiple hyperplastic polyps  . Tubal ligation     Physical Exam:BP 130/92  Pulse 80  Ht 5' (1.524 m)  Wt 150 lb (68.04 kg)  BMI 29.3 kg/m2her BP was 158/100 on arrival General:  Well developed, well nourished, no acute distress Skin:  Warm and  dry Neck:  Midline trachea,  Thyroid slightly enlarged, no carotid bruits heard Lungs; Clear to auscultation bilaterally Breast:  No dominant palpable mass, retraction, or nipple discharge Cardiovascular: Regular rate and rhythm Abdomen:  Soft, non tender, no hepatosplenomegaly Pelvic:  External genitalia is normal in appearance for age.  The vagina has decreased color, moisture and rugae. The cervix is smooth and has no CMT.Pap performed with HPV.Uterus is felt to be normal size, shape, and contour.  No adnexal masses or tenderness noted. Rectal: Good sphincter tone, no polyps, or hemorrhoids felt.  Hemoccult negative.She does have a small rectocele Extremities:  No swelling  noted Psych:  Alert and cooperative and seems happy today   Impression: Yearly Gyn exam  Mild rectocele History of hypertension,diabetes,COPD, nicotine addiction,chronic back pain,enlarged thyroid,anxiety  Plan: Physical in 1 year Mammogram yearly Labs per PCP  Follow up with Dr. Juanetta Gosling 6/5 as scheduled

## 2012-10-09 NOTE — Patient Instructions (Addendum)
Mammogram yearly Physical in 1 year Labs at PCP Follow up Dr Juanetta Gosling 6/5

## 2012-11-15 ENCOUNTER — Encounter (HOSPITAL_COMMUNITY): Payer: Self-pay

## 2012-11-15 ENCOUNTER — Emergency Department (HOSPITAL_COMMUNITY): Payer: PRIVATE HEALTH INSURANCE

## 2012-11-15 ENCOUNTER — Emergency Department (HOSPITAL_COMMUNITY)
Admission: EM | Admit: 2012-11-15 | Discharge: 2012-11-15 | Disposition: A | Discharge status: Home / Self Care | Payer: PRIVATE HEALTH INSURANCE | Attending: Emergency Medicine | Admitting: Emergency Medicine

## 2012-11-15 DIAGNOSIS — I10 Essential (primary) hypertension: Secondary | ICD-10-CM | POA: Insufficient documentation

## 2012-11-15 DIAGNOSIS — Z8709 Personal history of other diseases of the respiratory system: Secondary | ICD-10-CM | POA: Insufficient documentation

## 2012-11-15 DIAGNOSIS — J45901 Unspecified asthma with (acute) exacerbation: Secondary | ICD-10-CM | POA: Insufficient documentation

## 2012-11-15 DIAGNOSIS — Z8739 Personal history of other diseases of the musculoskeletal system and connective tissue: Secondary | ICD-10-CM | POA: Insufficient documentation

## 2012-11-15 DIAGNOSIS — Z8719 Personal history of other diseases of the digestive system: Secondary | ICD-10-CM | POA: Insufficient documentation

## 2012-11-15 DIAGNOSIS — I2510 Atherosclerotic heart disease of native coronary artery without angina pectoris: Secondary | ICD-10-CM | POA: Insufficient documentation

## 2012-11-15 DIAGNOSIS — Z862 Personal history of diseases of the blood and blood-forming organs and certain disorders involving the immune mechanism: Secondary | ICD-10-CM | POA: Insufficient documentation

## 2012-11-15 DIAGNOSIS — M129 Arthropathy, unspecified: Secondary | ICD-10-CM | POA: Insufficient documentation

## 2012-11-15 DIAGNOSIS — E119 Type 2 diabetes mellitus without complications: Secondary | ICD-10-CM | POA: Insufficient documentation

## 2012-11-15 DIAGNOSIS — R110 Nausea: Secondary | ICD-10-CM | POA: Insufficient documentation

## 2012-11-15 DIAGNOSIS — Z8701 Personal history of pneumonia (recurrent): Secondary | ICD-10-CM | POA: Insufficient documentation

## 2012-11-15 DIAGNOSIS — F411 Generalized anxiety disorder: Secondary | ICD-10-CM | POA: Insufficient documentation

## 2012-11-15 DIAGNOSIS — J441 Chronic obstructive pulmonary disease with (acute) exacerbation: Secondary | ICD-10-CM | POA: Insufficient documentation

## 2012-11-15 DIAGNOSIS — Z79899 Other long term (current) drug therapy: Secondary | ICD-10-CM | POA: Insufficient documentation

## 2012-11-15 DIAGNOSIS — I252 Old myocardial infarction: Secondary | ICD-10-CM | POA: Insufficient documentation

## 2012-11-15 DIAGNOSIS — F17200 Nicotine dependence, unspecified, uncomplicated: Secondary | ICD-10-CM | POA: Insufficient documentation

## 2012-11-15 DIAGNOSIS — R55 Syncope and collapse: Secondary | ICD-10-CM | POA: Insufficient documentation

## 2012-11-15 DIAGNOSIS — Z8639 Personal history of other endocrine, nutritional and metabolic disease: Secondary | ICD-10-CM | POA: Insufficient documentation

## 2012-11-15 DIAGNOSIS — Z8679 Personal history of other diseases of the circulatory system: Secondary | ICD-10-CM | POA: Insufficient documentation

## 2012-11-15 DIAGNOSIS — R0789 Other chest pain: Secondary | ICD-10-CM | POA: Insufficient documentation

## 2012-11-15 DIAGNOSIS — R42 Dizziness and giddiness: Secondary | ICD-10-CM | POA: Insufficient documentation

## 2012-11-15 LAB — CBC WITH DIFFERENTIAL/PLATELET
Basophils Absolute: 0 10*3/uL (ref 0.0–0.1)
Basophils Relative: 0 % (ref 0–1)
Eosinophils Absolute: 0.6 10*3/uL (ref 0.0–0.7)
Eosinophils Relative: 7 % — ABNORMAL HIGH (ref 0–5)
HCT: 41.9 % (ref 36.0–46.0)
Hemoglobin: 13.9 g/dL (ref 12.0–15.0)
Lymphocytes Relative: 39 % (ref 12–46)
Lymphs Abs: 3.2 10*3/uL (ref 0.7–4.0)
MCH: 29.8 pg (ref 26.0–34.0)
MCHC: 33.2 g/dL (ref 30.0–36.0)
MCV: 89.9 fL (ref 78.0–100.0)
Monocytes Absolute: 0.5 10*3/uL (ref 0.1–1.0)
Monocytes Relative: 6 % (ref 3–12)
Neutro Abs: 3.9 10*3/uL (ref 1.7–7.7)
Neutrophils Relative %: 48 % (ref 43–77)
Platelets: 225 10*3/uL (ref 150–400)
RBC: 4.66 MIL/uL (ref 3.87–5.11)
RDW: 15.1 % (ref 11.5–15.5)
WBC: 8.2 10*3/uL (ref 4.0–10.5)

## 2012-11-15 LAB — COMPREHENSIVE METABOLIC PANEL
ALT: 8 U/L (ref 0–35)
AST: 10 U/L (ref 0–37)
Albumin: 3.4 g/dL — ABNORMAL LOW (ref 3.5–5.2)
Alkaline Phosphatase: 92 U/L (ref 39–117)
BUN: 5 mg/dL — ABNORMAL LOW (ref 6–23)
CO2: 35 mEq/L — ABNORMAL HIGH (ref 19–32)
Calcium: 9 mg/dL (ref 8.4–10.5)
Chloride: 102 mEq/L (ref 96–112)
Creatinine, Ser: 0.86 mg/dL (ref 0.50–1.10)
GFR calc Af Amer: 83 mL/min — ABNORMAL LOW (ref >90)
GFR calc non Af Amer: 72 mL/min — ABNORMAL LOW (ref >90)
Glucose, Bld: 98 mg/dL (ref 70–99)
Potassium: 4.3 mEq/L (ref 3.5–5.1)
Sodium: 141 mEq/L (ref 135–145)
Total Bilirubin: 0.3 mg/dL (ref 0.3–1.2)
Total Protein: 6.6 g/dL (ref 6.0–8.3)

## 2012-11-15 LAB — URINALYSIS, ROUTINE W REFLEX MICROSCOPIC
Bilirubin Urine: NEGATIVE
Glucose, UA: NEGATIVE mg/dL
Ketones, ur: NEGATIVE mg/dL
Nitrite: NEGATIVE
Protein, ur: NEGATIVE mg/dL
Specific Gravity, Urine: <1.005 — ABNORMAL LOW (ref 1.005–1.030)
Urobilinogen, UA: 0.2 mg/dL (ref 0.0–1.0)
pH: 5.5 (ref 5.0–8.0)

## 2012-11-15 LAB — URINE MICROSCOPIC-ADD ON

## 2012-11-15 MED ORDER — IPRATROPIUM BROMIDE 0.02 % IN SOLN
0.5000 mg | Freq: Once | RESPIRATORY_TRACT | Status: AC
  Administered 2012-11-15: 0.5 mg via RESPIRATORY_TRACT
  Filled 2012-11-15: qty 2.5

## 2012-11-15 MED ORDER — ALBUTEROL SULFATE (5 MG/ML) 0.5% IN NEBU
5.0000 mg | INHALATION_SOLUTION | Freq: Once | RESPIRATORY_TRACT | Status: AC
  Administered 2012-11-15: 5 mg via RESPIRATORY_TRACT
  Filled 2012-11-15: qty 1

## 2012-11-15 MED ORDER — PREDNISONE 20 MG PO TABS: 20.0000 mg | ORAL_TABLET | Freq: Two times a day (BID) | ORAL | Status: DC

## 2012-11-15 MED ORDER — PREDNISONE 50 MG PO TABS
60.0000 mg | ORAL_TABLET | Freq: Once | ORAL | Status: AC
  Administered 2012-11-15: 60 mg via ORAL
  Filled 2012-11-15: qty 1

## 2012-11-15 NOTE — ED Provider Notes (Signed)
History  This chart was scribed for Flint Melter, MD by Bennett Scrape, ED Scribe. This patient was seen in room APA12/APA12 and the patient's care was started at 1:27 PM.  CSN: [id number]  Arrival date & time 11/15/12  1216   First MD Initiated Contact with Patient 11/15/12 1327     Chief Complaint  Patient presents with  . Respiratory Distress    The history is provided by the patient. No language interpreter was used.   HPI Comments: Kristin Wells is a 62 y.o. female with a h/o COPD brought in by ambulance, who presents to the Emergency Department complaining of one episode of LOC 6 days ago. She states that she "fainted" when it was light outside and woke up in the dark. She denies any further episodes of syncope. Pt states that she is having dizzy spells and chest tightness as well. She reports having a BM today and states that she has been ambulating at baseline since the episode. She reports that EMS was called by social worker who checks on her twice a week. She wears oxygen all the time and reports taking her nebulizer treatment today.  She reports taking prednisone as needed and states that she took one dose this morning. She denies SOB currently. She has a h/o MI, HTN and CAD. Pt is a current everyday smoker but denies alcohol use.  Dr. Sandre Kitty is PCP Pt lives alone.  Past Medical History  Diagnosis Date  . COPD (chronic obstructive pulmonary disease)   . Asthma   . Bronchitis   . Anxiety   . Back pain   . Demand ischemia of myocardium 06/28/2011  . Thyroid enlargement 06/28/2011  . MI, acute, non ST segment elevation 06/29/2011    Myoview stress test revealed mild perfusion defect  . Ischemic cardiomyopathy 06/28/2011    EF 25%. Myoview stress test later showed ejection fraction of 65%  . PNA (pneumonia) 06/29/2011  . Acute respiratory failure 06/2011    Vent dependent sec to COPD/PNA  . Hypertension   . Coronary artery disease   . Myocardial infarction   . Pneumonia    . Diabetes mellitus without complication   . Arthritis   . Rectocele 10/09/2012   Past Surgical History  Procedure Laterality Date  . Esophagogastroduodenoscopy  Feb 2013    Dr. Bosie Clos: normal duodenum, candida esophagitis, reactive gastropathy  . Colonoscopy  Feb 2013    Dr. Bosie Clos: internal hemorrhoids, normal view of ileum, multiple hyperplastic polyps  . Tubal ligation     Family History  Problem Relation Age of Onset  . Diabetes Mother   . Hypertension Mother   . Diabetes Father   . Hypertension Father   . Colon cancer Neg Hx    History  Substance Use Topics  . Smoking status: Current Every Day Smoker -- 0.50 packs/day for 44 years    Types: Cigarettes  . Smokeless tobacco: Never Used  . Alcohol Use: No   OB History   Grav Para Term Preterm Abortions TAB SAB Ect Mult Living   8 7   1  1   6      Review of Systems  Constitutional: Negative for fever.  Gastrointestinal: Positive for nausea. Negative for vomiting.  Neurological: Positive for dizziness and syncope.  All other systems reviewed and are negative.    Allergies  Review of patient's allergies indicates no known allergies.  Home Medications   Current Outpatient Rx  Name  Route  Sig  Dispense  Refill  . acidophilus (RISAQUAD) CAPS   Oral   Take 1 capsule by mouth daily.   30 capsule   5   . albuterol (PROVENTIL HFA;VENTOLIN HFA) 108 (90 BASE) MCG/ACT inhaler   Inhalation   Inhale 2 puffs into the lungs every 6 (six) hours as needed for wheezing or shortness of breath.         Marland Kitchen albuterol (PROVENTIL) (2.5 MG/3ML) 0.083% nebulizer solution   Nebulization   Take 2.5 mg by nebulization 4 (four) times daily.         Marland Kitchen ALPRAZolam (XANAX) 0.5 MG tablet   Oral   Take 1 tablet (0.5 mg total) by mouth 4 (four) times daily as needed for anxiety.   15 tablet   0   . calcium carbonate (OS-CAL - DOSED IN MG OF ELEMENTAL CALCIUM) 1250 MG tablet   Oral   Take 1 tablet by mouth daily.          . cetirizine (ZYRTEC) 10 MG tablet   Oral   Take 10 mg by mouth daily as needed for allergies.         . cyclobenzaprine (FLEXERIL) 5 MG tablet   Oral   Take 5 mg by mouth 2 (two) times daily as needed.          . fluticasone (FLONASE) 50 MCG/ACT nasal spray   Nasal   Place 1-2 sprays into the nose daily.          . Fluticasone-Salmeterol (ADVAIR) 250-50 MCG/DOSE AEPB   Inhalation   Inhale 1 puff into the lungs every 12 (twelve) hours.   60 each   1   . gabapentin (NEURONTIN) 300 MG capsule   Oral   Take 300 mg by mouth 2 (two) times daily.         . metFORMIN (GLUCOPHAGE) 500 MG tablet   Oral   Take 1 tablet (500 mg total) by mouth 2 (two) times daily with a meal.   60 tablet   12   . omeprazole (PRILOSEC) 20 MG capsule   Oral   Take 20 mg by mouth 2 (two) times daily.         Marland Kitchen oxyCODONE-acetaminophen (PERCOCET/ROXICET) 5-325 MG per tablet   Oral   Take 0.5 tablets by mouth 2 (two) times daily as needed for pain.         Marland Kitchen PARoxetine (PAXIL) 30 MG tablet   Oral   Take 1 tablet (30 mg total) by mouth every morning.   30 tablet   1   . polyethylene glycol powder (GLYCOLAX/MIRALAX) powder   Oral   Take 17 g by mouth daily.   255 g   1   . rosuvastatin (CRESTOR) 10 MG tablet   Oral   Take 10 mg by mouth daily.         Marland Kitchen tiotropium (SPIRIVA) 18 MCG inhalation capsule   Inhalation   Place 1 capsule (18 mcg total) into inhaler and inhale daily.   30 capsule   1   . traZODone (DESYREL) 50 MG tablet   Oral   Take 25 mg by mouth at bedtime.         . Vitamin D, Ergocalciferol, (DRISDOL) 50000 UNITS CAPS   Oral   Take 50,000 Units by mouth every 7 (seven) days. Takes every Monday         . predniSONE (DELTASONE) 20 MG tablet   Oral   Take 1 tablet (20 mg total) by mouth  2 (two) times daily.   10 tablet   0    Triage Vitals: BP 134/78  Pulse 88  Temp(Src) 98.1 F (36.7 C) (Oral)  Resp 18  Ht 5' (1.524 m)  Wt 140 lb (63.504 kg)   BMI 27.34 kg/m2  SpO2 89%  Physical Exam  Nursing note and vitals reviewed. Constitutional: She is oriented to person, place, and time. She appears well-developed and well-nourished.  HENT:  Head: Normocephalic and atraumatic.  Eyes: Conjunctivae and EOM are normal. Pupils are equal, round, and reactive to light.  Neck: Normal range of motion and phonation normal. Neck supple.  Cardiovascular: Normal rate, regular rhythm and intact distal pulses.   Pulmonary/Chest: Effort normal. She has wheezes (generalized ). She exhibits no tenderness.  Decreased air movement  Abdominal: Soft. She exhibits no distension. There is no tenderness. There is no guarding.  Musculoskeletal: Normal range of motion.  Neurological: She is alert and oriented to person, place, and time. She has normal strength. She exhibits normal muscle tone.  Skin: Skin is warm and dry.  Psychiatric: She has a normal mood and affect. Her behavior is normal. Judgment and thought content normal.    ED Course  Procedures (including critical care time)  Medications  albuterol (PROVENTIL) (5 MG/ML) 0.5% nebulizer solution 5 mg (5 mg Nebulization Given 11/15/12 1414)  ipratropium (ATROVENT) nebulizer solution 0.5 mg (0.5 mg Nebulization Given 11/15/12 1414)  predniSONE (DELTASONE) tablet 60 mg (60 mg Oral Given 11/15/12 1401)    DIAGNOSTIC STUDIES: Oxygen Saturation is 98% on [place], normal by my interpretation.    COORDINATION OF CARE: 1:36 PM- Reviewed negative CXR with pt. Discussed treatment plan which includes medications, CBC panel, CMP and UA with pt at bedside and pt agreed to plan.   5:06 PM-Pt rechecked and feels improved with medications listed above. Informed pt of normal work up. Discussed discharge instructions with pt and pt agreed.   Date: 11/15/2012  Rate: 87  Rhythm: normal sinus rhythm  QRS Axis: left  Intervals: normal  ST/T Wave abnormalities: normal  Conduction Disutrbances:none  Narrative  Interpretation:   Old EKG Reviewed: changes noted, right axis shift to left, T wave abnormalities resolved from 10/03/12   Labs Reviewed  CBC WITH DIFFERENTIAL - Abnormal; Notable for the following:    Eosinophils Relative 7 (*)    All other components within normal limits  COMPREHENSIVE METABOLIC PANEL - Abnormal; Notable for the following:    CO2 35 (*)    BUN 5 (*)    Albumin 3.4 (*)    GFR calc non Af Amer 72 (*)    GFR calc Af Amer 83 (*)    All other components within normal limits  URINALYSIS, ROUTINE W REFLEX MICROSCOPIC - Abnormal; Notable for the following:    Specific Gravity, Urine <1.005 (*)    Hgb urine dipstick SMALL (*)    Leukocytes, UA TRACE (*)    All other components within normal limits  URINE MICROSCOPIC-ADD ON - Abnormal; Notable for the following:    Squamous Epithelial / LPF FEW (*)    Bacteria, UA FEW (*)    All other components within normal limits  URINE CULTURE    Dg Chest 2 View  11/15/2012   *RADIOLOGY REPORT*  Clinical Data: Respiratory distress; emphysema with asthma  CHEST - 2 VIEW  Comparison: Oct 03, 2012  Findings:  There is a degree of underlying emphysema.  There is no edema or consolidation.  The heart size is normal.  Pulmonary vascularity reflects underlying emphysema.  No adenopathy.  There is atherosclerotic change in the aorta.  IMPRESSION: Underlying emphysematous change.  No edema or consolidation.   Original Report Authenticated By: Bretta Bang, M.D.   Radiologic imaging report reviewed and images by radiology  - viewed, by me. Nursing Notes Reviewed/ Care Coordinated Applicable Imaging Reviewed Interpretation of Laboratory Data incorporated into ED treatment  1. COPD exacerbation     MDM   Evaluation consistent with COPD exacerbation. Doubt ACS, PE, or pneumonia.Doubt metabolic instability, serious bacterial infection or impending vascular collapse; the patient is stable for discharge.     Plan: Home Medications-  prednisone; Home Treatments- take prednisone for 5 days; return here if the recommended treatment, does not improve the symptoms; Recommended follow up- with PCP  I personally performed the services described in this documentation, which was scribed in my presence. The recorded information has been reviewed and is accurate.        Flint Melter, MD 11/15/12 2024

## 2012-11-15 NOTE — ED Notes (Signed)
Pt has COPD. States she has used her inhaler and had a neb treatment this morning that did not help. Complain of dry hacky cough

## 2012-11-15 NOTE — ED Notes (Signed)
Pt states that she has been having SOB for 6 days.Pt states that she went to her Doctor on July 4 and he didn't treat her. Pt states that she has been having SOB since that time with burning in her throat. She states " I feel out last Friday and I dont remember much my chest feels like someone is stomping on it" Pt states taht she has been "calling and Calling the doctor and they won't answer". Pt called case worker to report problems today and case manager called EMS out of concern.

## 2012-11-17 LAB — URINE CULTURE
Colony Count: NO GROWTH
Culture: NO GROWTH

## 2012-12-05 ENCOUNTER — Emergency Department (HOSPITAL_COMMUNITY)
Admission: EM | Admit: 2012-12-05 | Discharge: 2012-12-05 | Disposition: A | Discharge status: Home / Self Care | Payer: PRIVATE HEALTH INSURANCE | Attending: Emergency Medicine | Admitting: Emergency Medicine

## 2012-12-05 ENCOUNTER — Emergency Department (HOSPITAL_COMMUNITY): Payer: PRIVATE HEALTH INSURANCE

## 2012-12-05 ENCOUNTER — Encounter (HOSPITAL_COMMUNITY): Payer: Self-pay

## 2012-12-05 DIAGNOSIS — F1320 Sedative, hypnotic or anxiolytic dependence, uncomplicated: Secondary | ICD-10-CM | POA: Insufficient documentation

## 2012-12-05 DIAGNOSIS — F19939 Other psychoactive substance use, unspecified with withdrawal, unspecified: Secondary | ICD-10-CM | POA: Insufficient documentation

## 2012-12-05 DIAGNOSIS — Z8701 Personal history of pneumonia (recurrent): Secondary | ICD-10-CM | POA: Insufficient documentation

## 2012-12-05 DIAGNOSIS — Z8679 Personal history of other diseases of the circulatory system: Secondary | ICD-10-CM | POA: Insufficient documentation

## 2012-12-05 DIAGNOSIS — F13239 Sedative, hypnotic or anxiolytic dependence with withdrawal, unspecified: Secondary | ICD-10-CM

## 2012-12-05 DIAGNOSIS — J45901 Unspecified asthma with (acute) exacerbation: Secondary | ICD-10-CM | POA: Insufficient documentation

## 2012-12-05 DIAGNOSIS — R0602 Shortness of breath: Secondary | ICD-10-CM | POA: Insufficient documentation

## 2012-12-05 DIAGNOSIS — F19239 Other psychoactive substance dependence with withdrawal, unspecified: Secondary | ICD-10-CM | POA: Insufficient documentation

## 2012-12-05 DIAGNOSIS — Z79899 Other long term (current) drug therapy: Secondary | ICD-10-CM | POA: Insufficient documentation

## 2012-12-05 DIAGNOSIS — J441 Chronic obstructive pulmonary disease with (acute) exacerbation: Secondary | ICD-10-CM | POA: Insufficient documentation

## 2012-12-05 DIAGNOSIS — E119 Type 2 diabetes mellitus without complications: Secondary | ICD-10-CM | POA: Insufficient documentation

## 2012-12-05 DIAGNOSIS — I10 Essential (primary) hypertension: Secondary | ICD-10-CM | POA: Insufficient documentation

## 2012-12-05 DIAGNOSIS — F411 Generalized anxiety disorder: Secondary | ICD-10-CM | POA: Insufficient documentation

## 2012-12-05 DIAGNOSIS — Z8742 Personal history of other diseases of the female genital tract: Secondary | ICD-10-CM | POA: Insufficient documentation

## 2012-12-05 DIAGNOSIS — F17200 Nicotine dependence, unspecified, uncomplicated: Secondary | ICD-10-CM | POA: Insufficient documentation

## 2012-12-05 DIAGNOSIS — F13939 Sedative, hypnotic or anxiolytic use, unspecified with withdrawal, unspecified: Secondary | ICD-10-CM

## 2012-12-05 DIAGNOSIS — I252 Old myocardial infarction: Secondary | ICD-10-CM | POA: Insufficient documentation

## 2012-12-05 DIAGNOSIS — I2510 Atherosclerotic heart disease of native coronary artery without angina pectoris: Secondary | ICD-10-CM | POA: Insufficient documentation

## 2012-12-05 DIAGNOSIS — J449 Chronic obstructive pulmonary disease, unspecified: Secondary | ICD-10-CM

## 2012-12-05 DIAGNOSIS — IMO0002 Reserved for concepts with insufficient information to code with codable children: Secondary | ICD-10-CM | POA: Insufficient documentation

## 2012-12-05 DIAGNOSIS — K5900 Constipation, unspecified: Secondary | ICD-10-CM | POA: Insufficient documentation

## 2012-12-05 DIAGNOSIS — Z8709 Personal history of other diseases of the respiratory system: Secondary | ICD-10-CM | POA: Insufficient documentation

## 2012-12-05 LAB — BASIC METABOLIC PANEL
BUN: 10 mg/dL (ref 6–23)
CO2: 31 mEq/L (ref 19–32)
Calcium: 9.6 mg/dL (ref 8.4–10.5)
Chloride: 98 mEq/L (ref 96–112)
Creatinine, Ser: 0.78 mg/dL (ref 0.50–1.10)
GFR calc Af Amer: >90 mL/min (ref >90)
GFR calc non Af Amer: 89 mL/min — ABNORMAL LOW (ref >90)
Glucose, Bld: 90 mg/dL (ref 70–99)
Potassium: 3.7 mEq/L (ref 3.5–5.1)
Sodium: 138 mEq/L (ref 135–145)

## 2012-12-05 LAB — CBC WITH DIFFERENTIAL/PLATELET
Basophils Absolute: 0 10*3/uL (ref 0.0–0.1)
Basophils Relative: 0 % (ref 0–1)
Eosinophils Absolute: 0.9 10*3/uL — ABNORMAL HIGH (ref 0.0–0.7)
Eosinophils Relative: 9 % — ABNORMAL HIGH (ref 0–5)
HCT: 44.8 % (ref 36.0–46.0)
Hemoglobin: 14.7 g/dL (ref 12.0–15.0)
Lymphocytes Relative: 35 % (ref 12–46)
Lymphs Abs: 3.4 10*3/uL (ref 0.7–4.0)
MCH: 29.3 pg (ref 26.0–34.0)
MCHC: 32.8 g/dL (ref 30.0–36.0)
MCV: 89.4 fL (ref 78.0–100.0)
Monocytes Absolute: 0.4 10*3/uL (ref 0.1–1.0)
Monocytes Relative: 5 % (ref 3–12)
Neutro Abs: 4.9 10*3/uL (ref 1.7–7.7)
Neutrophils Relative %: 51 % (ref 43–77)
Platelets: 246 10*3/uL (ref 150–400)
RBC: 5.01 MIL/uL (ref 3.87–5.11)
RDW: 14.1 % (ref 11.5–15.5)
WBC: 9.7 10*3/uL (ref 4.0–10.5)

## 2012-12-05 MED ORDER — IPRATROPIUM BROMIDE 0.02 % IN SOLN
0.5000 mg | Freq: Once | RESPIRATORY_TRACT | Status: AC
  Administered 2012-12-05: 0.5 mg via RESPIRATORY_TRACT
  Filled 2012-12-05: qty 2.5

## 2012-12-05 MED ORDER — ALBUTEROL SULFATE (5 MG/ML) 0.5% IN NEBU
5.0000 mg | INHALATION_SOLUTION | Freq: Once | RESPIRATORY_TRACT | Status: AC
  Administered 2012-12-05: 5 mg via RESPIRATORY_TRACT
  Filled 2012-12-05: qty 1

## 2012-12-05 MED ORDER — LORAZEPAM 2 MG/ML IJ SOLN
1.0000 mg | Freq: Once | INTRAMUSCULAR | Status: AC
  Administered 2012-12-05: 1 mg via INTRAVENOUS
  Filled 2012-12-05: qty 1

## 2012-12-05 NOTE — ED Notes (Signed)
nad noted prior to dc. Dc instructions reviewed and explained with pt. Pt voiced understanding.  

## 2012-12-05 NOTE — ED Provider Notes (Signed)
CSN: [id number]     Arrival date & time 12/05/12  1559 History    This chart was scribed for Gilda Crease, * by Donne Anon, ED Scribe. This patient was seen in room APA18/APA18 and the patient's care was started at 1601.   First MD Initiated Contact with Patient 12/05/12 1601     Chief Complaint  Patient presents with  . Seizures    The history is provided by the patient, the EMS personnel and a relative. No language interpreter was used.   HPI Comments: Kristin Wells is a 62 y.o. female brought in by ambulance, who presents to the Emergency Department complaining of subjective seizures. Her family reports she was "staring off into space." The pt states she was shaking and breathing heavily and biting on her tongue. She states she was conscious, and knew what was going on around her, but didn't feel right. She normally takes Xanax, but has been out for the past 3 days because she could not afford to fill her prescription. She frequently has panic attacks, but this did not feel like a panic attack to her. She states she hasn't had a BM in 5 days. She has a family hx of seizures.   Dr.Hoskins is her MD.    Past Medical History  Diagnosis Date  . COPD (chronic obstructive pulmonary disease)   . Asthma   . Bronchitis   . Anxiety   . Back pain   . Demand ischemia of myocardium 06/28/2011  . Thyroid enlargement 06/28/2011  . MI, acute, non ST segment elevation 06/29/2011    Myoview stress test revealed mild perfusion defect  . Ischemic cardiomyopathy 06/28/2011    EF 25%. Myoview stress test later showed ejection fraction of 65%  . PNA (pneumonia) 06/29/2011  . Acute respiratory failure 06/2011    Vent dependent sec to COPD/PNA  . Hypertension   . Coronary artery disease   . Myocardial infarction   . Pneumonia   . Diabetes mellitus without complication   . Arthritis   . Rectocele 10/09/2012   Past Surgical History  Procedure Laterality Date  . Esophagogastroduodenoscopy   Feb 2013    Dr. Bosie Clos: normal duodenum, candida esophagitis, reactive gastropathy  . Colonoscopy  Feb 2013    Dr. Bosie Clos: internal hemorrhoids, normal view of ileum, multiple hyperplastic polyps  . Tubal ligation     Family History  Problem Relation Age of Onset  . Diabetes Mother   . Hypertension Mother   . Diabetes Father   . Hypertension Father   . Colon cancer Neg Hx    History  Substance Use Topics  . Smoking status: Current Every Day Smoker -- 0.50 packs/day for 44 years    Types: Cigarettes  . Smokeless tobacco: Never Used  . Alcohol Use: No   OB History   Grav Para Term Preterm Abortions TAB SAB Ect Mult Living   8 7   1  1   6      Review of Systems  Respiratory: Positive for wheezing.   Gastrointestinal: Positive for constipation.  Neurological: Positive for seizures (subjective).  All other systems reviewed and are negative.    Allergies  Review of patient's allergies indicates no known allergies.  Home Medications   Current Outpatient Rx  Name  Route  Sig  Dispense  Refill  . acidophilus (RISAQUAD) CAPS   Oral   Take 1 capsule by mouth daily.   30 capsule   5   . albuterol (PROVENTIL  HFA;VENTOLIN HFA) 108 (90 BASE) MCG/ACT inhaler   Inhalation   Inhale 2 puffs into the lungs every 6 (six) hours as needed for wheezing or shortness of breath.         Marland Kitchen albuterol (PROVENTIL) (2.5 MG/3ML) 0.083% nebulizer solution   Nebulization   Take 2.5 mg by nebulization 4 (four) times daily.         Marland Kitchen ALPRAZolam (XANAX) 0.5 MG tablet   Oral   Take 1 tablet (0.5 mg total) by mouth 4 (four) times daily as needed for anxiety.   15 tablet   0   . calcium carbonate (OS-CAL - DOSED IN MG OF ELEMENTAL CALCIUM) 1250 MG tablet   Oral   Take 1 tablet by mouth daily.         . cetirizine (ZYRTEC) 10 MG tablet   Oral   Take 10 mg by mouth daily as needed for allergies.         . cyclobenzaprine (FLEXERIL) 5 MG tablet   Oral   Take 5 mg by mouth 2  (two) times daily as needed.          . fluticasone (FLONASE) 50 MCG/ACT nasal spray   Nasal   Place 1-2 sprays into the nose daily.          . Fluticasone-Salmeterol (ADVAIR) 250-50 MCG/DOSE AEPB   Inhalation   Inhale 1 puff into the lungs every 12 (twelve) hours.   60 each   1   . gabapentin (NEURONTIN) 300 MG capsule   Oral   Take 300 mg by mouth 2 (two) times daily.         . metFORMIN (GLUCOPHAGE) 500 MG tablet   Oral   Take 1 tablet (500 mg total) by mouth 2 (two) times daily with a meal.   60 tablet   12   . omeprazole (PRILOSEC) 20 MG capsule   Oral   Take 20 mg by mouth 2 (two) times daily.         Marland Kitchen oxyCODONE-acetaminophen (PERCOCET/ROXICET) 5-325 MG per tablet   Oral   Take 0.5 tablets by mouth 2 (two) times daily as needed for pain.         Marland Kitchen PARoxetine (PAXIL) 30 MG tablet   Oral   Take 1 tablet (30 mg total) by mouth every morning.   30 tablet   1   . polyethylene glycol powder (GLYCOLAX/MIRALAX) powder   Oral   Take 17 g by mouth daily.   255 g   1   . predniSONE (DELTASONE) 20 MG tablet   Oral   Take 1 tablet (20 mg total) by mouth 2 (two) times daily.   10 tablet   0   . rosuvastatin (CRESTOR) 10 MG tablet   Oral   Take 10 mg by mouth daily.         Marland Kitchen tiotropium (SPIRIVA) 18 MCG inhalation capsule   Inhalation   Place 1 capsule (18 mcg total) into inhaler and inhale daily.   30 capsule   1   . traZODone (DESYREL) 50 MG tablet   Oral   Take 25 mg by mouth at bedtime.         . Vitamin D, Ergocalciferol, (DRISDOL) 50000 UNITS CAPS   Oral   Take 50,000 Units by mouth every 7 (seven) days. Takes every Monday          BP 104/76  Pulse 106  Temp(Src) 97.9 F (36.6 C) (Oral)  Resp 25  SpO2 90%  Physical Exam  Constitutional: She is oriented to person, place, and time. She appears well-developed and well-nourished. No distress.  HENT:  Head: Normocephalic and atraumatic.  Right Ear: Hearing normal.  Left Ear:  Hearing normal.  Nose: Nose normal.  Mouth/Throat: Oropharynx is clear and moist and mucous membranes are normal.  Eyes: Conjunctivae and EOM are normal. Pupils are equal, round, and reactive to light.  Neck: Normal range of motion. Neck supple.  Cardiovascular: Regular rhythm, S1 normal and S2 normal.  Exam reveals no gallop and no friction rub.   No murmur heard. Pulmonary/Chest: Effort normal. No respiratory distress. She has wheezes (bilateral). She exhibits no tenderness.  Abdominal: Soft. Normal appearance and bowel sounds are normal. There is no hepatosplenomegaly. There is no tenderness. There is no rebound, no guarding, no tenderness at McBurney's point and negative Murphy's sign. No hernia.  Musculoskeletal: Normal range of motion.  Neurological: She is alert and oriented to person, place, and time. She has normal strength. No cranial nerve deficit or sensory deficit. Coordination normal. GCS eye subscore is 4. GCS verbal subscore is 5. GCS motor subscore is 6.  Skin: Skin is warm, dry and intact. No rash noted. No cyanosis.  Psychiatric: She has a normal mood and affect. Her speech is normal and behavior is normal. Thought content normal.    ED Course   Procedures (including critical care time) DIAGNOSTIC STUDIES: Oxygen Saturation is 90% on [place], low by my interpretation.    COORDINATION OF CARE: 4:06 PM Discussed treatment plan which includes Lorazepam, Albuterol, Atrovent, CXR, CBC with differential and basic metabolic panel with pt at bedside and pt agreed to plan.     Labs Reviewed  CBC WITH DIFFERENTIAL - Abnormal; Notable for the following:    Eosinophils Relative 9 (*)    Eosinophils Absolute 0.9 (*)    All other components within normal limits  BASIC METABOLIC PANEL - Abnormal; Notable for the following:    GFR calc non Af Amer 89 (*)    All other components within normal limits   Dg Chest 2 View  12/05/2012   *RADIOLOGY REPORT*  Clinical Data: Shortness of  breath.  History of asthma, COPD and bronchitis.  CHEST - 2 VIEW  Comparison: 11/15/2012  Findings: Stable chronic lung disease.  No pulmonary edema, airspace consolidation or pleural fluid is identified.  Heart size is normal.  Stable osteopenia of the thoracic spine.  IMPRESSION: Stable chronic lung disease.  No acute findings.   Original Report Authenticated By: Irish Lack, M.D.   Diagnosis: 1. Xanax withdrawal 2. Possible seizure 3. COPD exacerbation  MDM  She comes to the ER for possible seizure. Patient reports that she has been off her Xanax for 3 days because she did not have the money to refill her prescription. Today she had an episode of staring off into space and then some shaking motions. She tells me, however, that she was awake for the whole thing and remembers the entire episode. This does not seem particularly to see her, but with her being off of the Xanax for 3 days, withdrawal seizure is possible. Her workup today is unremarkable. She was noted to have wheezing on auscultation, does have COPD. Chest x-ray performed and patient treated with bronchodilators.  Patient administered Ativan here in the ER. She is to fill her prescription to reinitiate her Xanax to prevent any further episodes. Patient also will continue bronchodilator therapy for her COPD.  I personally performed the services  described in this documentation, which was scribed in my presence. The recorded information has been reviewed and is accurate.     Gilda Crease, MD 12/05/12 1816

## 2012-12-05 NOTE — ED Notes (Signed)
MD at bedside. 

## 2012-12-05 NOTE — ED Notes (Signed)
Pt was sitting on couch and family reports pt was " staring off in space" EMS reports pt had been off of her xanax for 3 days. Pt alert and talkative upon arrival.

## 2012-12-05 NOTE — ED Notes (Signed)
Blood pressure re-checked 104/76.

## 2013-01-28 ENCOUNTER — Encounter (HOSPITAL_COMMUNITY): Payer: Self-pay | Admitting: *Deleted

## 2013-01-28 ENCOUNTER — Inpatient Hospital Stay (HOSPITAL_COMMUNITY)
Admission: EM | Admit: 2013-01-28 | Discharge: 2013-02-01 | DRG: 208 | Disposition: A | Discharge status: Home Health | Payer: PRIVATE HEALTH INSURANCE | Attending: Pulmonary Disease | Admitting: Pulmonary Disease

## 2013-01-28 ENCOUNTER — Emergency Department (HOSPITAL_COMMUNITY): Payer: PRIVATE HEALTH INSURANCE

## 2013-01-28 DIAGNOSIS — J9620 Acute and chronic respiratory failure, unspecified whether with hypoxia or hypercapnia: Principal | ICD-10-CM | POA: Diagnosis present

## 2013-01-28 DIAGNOSIS — Z23 Encounter for immunization: Secondary | ICD-10-CM

## 2013-01-28 DIAGNOSIS — F319 Bipolar disorder, unspecified: Secondary | ICD-10-CM | POA: Diagnosis present

## 2013-01-28 DIAGNOSIS — I2589 Other forms of chronic ischemic heart disease: Secondary | ICD-10-CM | POA: Diagnosis present

## 2013-01-28 DIAGNOSIS — J45901 Unspecified asthma with (acute) exacerbation: Secondary | ICD-10-CM | POA: Diagnosis present

## 2013-01-28 DIAGNOSIS — I252 Old myocardial infarction: Secondary | ICD-10-CM

## 2013-01-28 DIAGNOSIS — M129 Arthropathy, unspecified: Secondary | ICD-10-CM | POA: Diagnosis present

## 2013-01-28 DIAGNOSIS — J441 Chronic obstructive pulmonary disease with (acute) exacerbation: Secondary | ICD-10-CM

## 2013-01-28 DIAGNOSIS — J9602 Acute respiratory failure with hypercapnia: Secondary | ICD-10-CM

## 2013-01-28 DIAGNOSIS — F17200 Nicotine dependence, unspecified, uncomplicated: Secondary | ICD-10-CM | POA: Diagnosis present

## 2013-01-28 DIAGNOSIS — Z9981 Dependence on supplemental oxygen: Secondary | ICD-10-CM

## 2013-01-28 DIAGNOSIS — F411 Generalized anxiety disorder: Secondary | ICD-10-CM | POA: Diagnosis present

## 2013-01-28 DIAGNOSIS — J9690 Respiratory failure, unspecified, unspecified whether with hypoxia or hypercapnia: Secondary | ICD-10-CM

## 2013-01-28 DIAGNOSIS — J9600 Acute respiratory failure, unspecified whether with hypoxia or hypercapnia: Secondary | ICD-10-CM

## 2013-01-28 DIAGNOSIS — J9610 Chronic respiratory failure, unspecified whether with hypoxia or hypercapnia: Secondary | ICD-10-CM

## 2013-01-28 DIAGNOSIS — F419 Anxiety disorder, unspecified: Secondary | ICD-10-CM

## 2013-01-28 DIAGNOSIS — G8929 Other chronic pain: Secondary | ICD-10-CM | POA: Diagnosis present

## 2013-01-28 DIAGNOSIS — I509 Heart failure, unspecified: Secondary | ICD-10-CM | POA: Diagnosis present

## 2013-01-28 DIAGNOSIS — E119 Type 2 diabetes mellitus without complications: Secondary | ICD-10-CM | POA: Diagnosis present

## 2013-01-28 DIAGNOSIS — J449 Chronic obstructive pulmonary disease, unspecified: Secondary | ICD-10-CM

## 2013-01-28 DIAGNOSIS — I2510 Atherosclerotic heart disease of native coronary artery without angina pectoris: Secondary | ICD-10-CM | POA: Diagnosis present

## 2013-01-28 DIAGNOSIS — I5022 Chronic systolic (congestive) heart failure: Secondary | ICD-10-CM | POA: Diagnosis present

## 2013-01-28 DIAGNOSIS — J9611 Chronic respiratory failure with hypoxia: Secondary | ICD-10-CM | POA: Diagnosis present

## 2013-01-28 DIAGNOSIS — I10 Essential (primary) hypertension: Secondary | ICD-10-CM | POA: Diagnosis present

## 2013-01-28 DIAGNOSIS — Z79899 Other long term (current) drug therapy: Secondary | ICD-10-CM

## 2013-01-28 LAB — URINALYSIS, ROUTINE W REFLEX MICROSCOPIC
Bilirubin Urine: NEGATIVE
Glucose, UA: NEGATIVE mg/dL
Ketones, ur: NEGATIVE mg/dL
Leukocytes, UA: NEGATIVE
Nitrite: NEGATIVE
Protein, ur: 30 mg/dL — AB
Specific Gravity, Urine: 1.025 (ref 1.005–1.030)
Urobilinogen, UA: 0.2 mg/dL (ref 0.0–1.0)
pH: 6 (ref 5.0–8.0)

## 2013-01-28 LAB — URINE MICROSCOPIC-ADD ON

## 2013-01-28 LAB — COMPREHENSIVE METABOLIC PANEL
ALT: 7 U/L (ref 0–35)
AST: 12 U/L (ref 0–37)
Albumin: 3.7 g/dL (ref 3.5–5.2)
Alkaline Phosphatase: 96 U/L (ref 39–117)
BUN: 9 mg/dL (ref 6–23)
CO2: 33 mEq/L — ABNORMAL HIGH (ref 19–32)
Calcium: 9.2 mg/dL (ref 8.4–10.5)
Chloride: 95 mEq/L — ABNORMAL LOW (ref 96–112)
Creatinine, Ser: 0.71 mg/dL (ref 0.50–1.10)
GFR calc Af Amer: >90 mL/min (ref >90)
GFR calc non Af Amer: >90 mL/min (ref >90)
Glucose, Bld: 205 mg/dL — ABNORMAL HIGH (ref 70–99)
Potassium: 4.3 mEq/L (ref 3.5–5.1)
Sodium: 137 mEq/L (ref 135–145)
Total Bilirubin: 0.4 mg/dL (ref 0.3–1.2)
Total Protein: 7.5 g/dL (ref 6.0–8.3)

## 2013-01-28 LAB — BLOOD GAS, ARTERIAL
Acid-Base Excess: 3.6 mmol/L — ABNORMAL HIGH (ref 0.0–2.0)
Acid-base deficit: 2.8 mmol/L — ABNORMAL HIGH (ref 0.0–2.0)
Bicarbonate: 28.3 mEq/L — ABNORMAL HIGH (ref 20.0–24.0)
FIO2: 100 %
MECHVT: 500 mL
O2 Saturation: 100.1 %
PEEP: 5 cmH2O
RATE: 16 resp/min
TCO2: 24.6 mmol/L (ref 0–100)
pCO2 arterial: 48.8 mmHg — ABNORMAL HIGH (ref 35.0–45.0)
pH, Arterial: 7.381 (ref 7.350–7.450)
pO2, Arterial: 512 mmHg — ABNORMAL HIGH (ref 80.0–100.0)

## 2013-01-28 LAB — CBC WITH DIFFERENTIAL/PLATELET
Basophils Absolute: 0 10*3/uL (ref 0.0–0.1)
Basophils Relative: 0 % (ref 0–1)
Eosinophils Absolute: 0.8 10*3/uL — ABNORMAL HIGH (ref 0.0–0.7)
Eosinophils Relative: 5 % (ref 0–5)
HCT: 46.2 % — ABNORMAL HIGH (ref 36.0–46.0)
Hemoglobin: 14.3 g/dL (ref 12.0–15.0)
Lymphocytes Relative: 13 % (ref 12–46)
Lymphs Abs: 1.8 10*3/uL (ref 0.7–4.0)
MCH: 27.9 pg (ref 26.0–34.0)
MCHC: 31 g/dL (ref 30.0–36.0)
MCV: 90.1 fL (ref 78.0–100.0)
Monocytes Absolute: 0.4 10*3/uL (ref 0.1–1.0)
Monocytes Relative: 3 % (ref 3–12)
Neutro Abs: 11.3 10*3/uL — ABNORMAL HIGH (ref 1.7–7.7)
Neutrophils Relative %: 79 % — ABNORMAL HIGH (ref 43–77)
Platelets: 238 10*3/uL (ref 150–400)
RBC: 5.13 MIL/uL — ABNORMAL HIGH (ref 3.87–5.11)
RDW: 14.5 % (ref 11.5–15.5)
WBC: 14.4 10*3/uL — ABNORMAL HIGH (ref 4.0–10.5)

## 2013-01-28 LAB — LIPASE, BLOOD: Lipase: 14 U/L (ref 11–59)

## 2013-01-28 LAB — GLUCOSE, CAPILLARY
Glucose-Capillary: 164 mg/dL — ABNORMAL HIGH (ref 70–99)
Glucose-Capillary: 150 mg/dL — ABNORMAL HIGH (ref 70–99)

## 2013-01-28 LAB — TROPONIN I: Troponin I: <0.3 ng/mL (ref <0.30)

## 2013-01-28 LAB — PRO B NATRIURETIC PEPTIDE: Pro B Natriuretic peptide (BNP): 189.4 pg/mL — ABNORMAL HIGH (ref 0–125)

## 2013-01-28 LAB — PROTIME-INR
INR: 1.07 (ref 0.00–1.49)
Prothrombin Time: 13.7 seconds (ref 11.6–15.2)

## 2013-01-28 LAB — MRSA PCR SCREENING: MRSA by PCR: NEGATIVE

## 2013-01-28 MED ORDER — ONDANSETRON HCL 4 MG/2ML IJ SOLN: 4.0000 mg | Freq: Once | INTRAMUSCULAR | Status: DC

## 2013-01-28 MED ORDER — ROCURONIUM BROMIDE 50 MG/5ML IV SOLN
INTRAVENOUS | Status: AC
  Filled 2013-01-28: qty 2

## 2013-01-28 MED ORDER — ALBUTEROL SULFATE (5 MG/ML) 0.5% IN NEBU: 2.5000 mg | INHALATION_SOLUTION | RESPIRATORY_TRACT | Status: DC

## 2013-01-28 MED ORDER — LIDOCAINE HCL (CARDIAC) 20 MG/ML IV SOLN
INTRAVENOUS | Status: AC
  Filled 2013-01-28: qty 5

## 2013-01-28 MED ORDER — PROPOFOL 10 MG/ML IV EMUL
INTRAVENOUS | Status: AC
  Administered 2013-01-28: 1000 mg
  Filled 2013-01-28: qty 100

## 2013-01-28 MED ORDER — FENTANYL CITRATE 0.05 MG/ML IJ SOLN
50.0000 ug | INTRAMUSCULAR | Status: DC | PRN
  Administered 2013-01-28: 100 ug via INTRAVENOUS
  Administered 2013-01-29: 50 ug via INTRAVENOUS
  Filled 2013-01-28 (×2): qty 2

## 2013-01-28 MED ORDER — SODIUM CHLORIDE 0.9 % IV SOLN
INTRAVENOUS | Status: DC
  Administered 2013-01-28 – 2013-02-01 (×6): via INTRAVENOUS

## 2013-01-28 MED ORDER — PANTOPRAZOLE SODIUM 40 MG IV SOLR
40.0000 mg | Freq: Every day | INTRAVENOUS | Status: DC
  Administered 2013-01-28 – 2013-01-31 (×4): 40 mg via INTRAVENOUS
  Filled 2013-01-28 (×4): qty 40

## 2013-01-28 MED ORDER — SODIUM CHLORIDE 0.9 % IV SOLN: INTRAVENOUS | Status: DC

## 2013-01-28 MED ORDER — SODIUM CHLORIDE 0.9 % IJ SOLN
3.0000 mL | Freq: Two times a day (BID) | INTRAMUSCULAR | Status: DC
  Administered 2013-01-28 – 2013-01-31 (×5): 3 mL via INTRAVENOUS

## 2013-01-28 MED ORDER — ENOXAPARIN SODIUM 40 MG/0.4ML ~~LOC~~ SOLN
40.0000 mg | SUBCUTANEOUS | Status: DC
  Administered 2013-01-28 – 2013-01-31 (×4): 40 mg via SUBCUTANEOUS
  Filled 2013-01-28 (×4): qty 0.4

## 2013-01-28 MED ORDER — ALBUTEROL SULFATE (5 MG/ML) 0.5% IN NEBU
INHALATION_SOLUTION | RESPIRATORY_TRACT | Status: AC
  Filled 2013-01-28: qty 2

## 2013-01-28 MED ORDER — ALBUTEROL SULFATE HFA 108 (90 BASE) MCG/ACT IN AERS: 4.0000 | INHALATION_SPRAY | RESPIRATORY_TRACT | Status: DC | PRN

## 2013-01-28 MED ORDER — SUCCINYLCHOLINE CHLORIDE 20 MG/ML IJ SOLN
INTRAMUSCULAR | Status: AC | PRN
  Administered 2013-01-28: 120 mg via INTRAVENOUS

## 2013-01-28 MED ORDER — ACETAMINOPHEN 650 MG RE SUPP: 650.0000 mg | Freq: Four times a day (QID) | RECTAL | Status: DC | PRN

## 2013-01-28 MED ORDER — BIOTENE DRY MOUTH MT LIQD: 15.0000 mL | Freq: Four times a day (QID) | OROMUCOSAL | Status: DC

## 2013-01-28 MED ORDER — BIOTENE DRY MOUTH MT LIQD
15.0000 mL | Freq: Four times a day (QID) | OROMUCOSAL | Status: DC
  Administered 2013-01-29 – 2013-01-31 (×10): 15 mL via OROMUCOSAL

## 2013-01-28 MED ORDER — ONDANSETRON HCL 4 MG PO TABS: 4.0000 mg | ORAL_TABLET | Freq: Four times a day (QID) | ORAL | Status: DC | PRN

## 2013-01-28 MED ORDER — IPRATROPIUM BROMIDE HFA 17 MCG/ACT IN AERS
4.0000 | INHALATION_SPRAY | RESPIRATORY_TRACT | Status: DC
  Administered 2013-01-28 – 2013-01-29 (×5): 4 via RESPIRATORY_TRACT
  Filled 2013-01-28: qty 12.9

## 2013-01-28 MED ORDER — LEVOFLOXACIN IN D5W 500 MG/100ML IV SOLN
500.0000 mg | INTRAVENOUS | Status: DC
  Administered 2013-01-28 – 2013-01-30 (×3): 500 mg via INTRAVENOUS
  Filled 2013-01-28 (×4): qty 100

## 2013-01-28 MED ORDER — ETOMIDATE 2 MG/ML IV SOLN
INTRAVENOUS | Status: AC
  Administered 2013-01-28: 13:00:00
  Filled 2013-01-28: qty 20

## 2013-01-28 MED ORDER — IPRATROPIUM BROMIDE 0.02 % IN SOLN: 0.5000 mg | RESPIRATORY_TRACT | Status: DC

## 2013-01-28 MED ORDER — ALBUTEROL SULFATE (5 MG/ML) 0.5% IN NEBU
10.0000 mg | INHALATION_SOLUTION | Freq: Four times a day (QID) | RESPIRATORY_TRACT | Status: DC | PRN
  Administered 2013-01-28: 10 mg via RESPIRATORY_TRACT

## 2013-01-28 MED ORDER — ACETAMINOPHEN 325 MG PO TABS
650.0000 mg | ORAL_TABLET | Freq: Four times a day (QID) | ORAL | Status: DC | PRN
  Administered 2013-01-30: 650 mg via ORAL
  Filled 2013-01-28: qty 2

## 2013-01-28 MED ORDER — CHLORHEXIDINE GLUCONATE 0.12 % MT SOLN
15.0000 mL | Freq: Two times a day (BID) | OROMUCOSAL | Status: DC
  Administered 2013-01-28 – 2013-01-30 (×5): 15 mL via OROMUCOSAL
  Filled 2013-01-28 (×5): qty 15

## 2013-01-28 MED ORDER — MIDAZOLAM HCL 2 MG/2ML IJ SOLN
2.0000 mg | INTRAMUSCULAR | Status: DC | PRN
  Administered 2013-01-28 – 2013-01-29 (×4): 2 mg via INTRAVENOUS
  Filled 2013-01-28 (×4): qty 2

## 2013-01-28 MED ORDER — ETOMIDATE 2 MG/ML IV SOLN
INTRAVENOUS | Status: AC | PRN
  Administered 2013-01-28: 20 mg via INTRAVENOUS

## 2013-01-28 MED ORDER — INSULIN ASPART 100 UNIT/ML ~~LOC~~ SOLN
0.0000 [IU] | Freq: Four times a day (QID) | SUBCUTANEOUS | Status: DC
  Administered 2013-01-28: 1 [IU] via SUBCUTANEOUS
  Administered 2013-01-28 – 2013-01-29 (×2): 2 [IU] via SUBCUTANEOUS
  Administered 2013-01-29 (×3): 1 [IU] via SUBCUTANEOUS
  Administered 2013-01-30: 3 [IU] via SUBCUTANEOUS
  Administered 2013-01-30 (×2): 1 [IU] via SUBCUTANEOUS
  Administered 2013-01-30: 2 [IU] via SUBCUTANEOUS
  Administered 2013-01-31: 3 [IU] via SUBCUTANEOUS
  Administered 2013-01-31 (×2): 2 [IU] via SUBCUTANEOUS
  Administered 2013-01-31: 1 [IU] via SUBCUTANEOUS
  Administered 2013-02-01: 2 [IU] via SUBCUTANEOUS
  Administered 2013-02-01: 5 [IU] via SUBCUTANEOUS

## 2013-01-28 MED ORDER — METHYLPREDNISOLONE SODIUM SUCC 40 MG IJ SOLR
40.0000 mg | Freq: Four times a day (QID) | INTRAMUSCULAR | Status: DC
  Administered 2013-01-28 – 2013-02-01 (×15): 40 mg via INTRAVENOUS
  Filled 2013-01-28 (×15): qty 1

## 2013-01-28 MED ORDER — SUCCINYLCHOLINE CHLORIDE 20 MG/ML IJ SOLN
INTRAMUSCULAR | Status: AC
  Filled 2013-01-28: qty 1

## 2013-01-28 MED ORDER — CHLORHEXIDINE GLUCONATE 0.12 % MT SOLN: 15.0000 mL | Freq: Two times a day (BID) | OROMUCOSAL | Status: DC

## 2013-01-28 MED ORDER — ONDANSETRON HCL 4 MG/2ML IJ SOLN: 4.0000 mg | Freq: Four times a day (QID) | INTRAMUSCULAR | Status: DC | PRN

## 2013-01-28 MED ORDER — ALBUTEROL SULFATE HFA 108 (90 BASE) MCG/ACT IN AERS
4.0000 | INHALATION_SPRAY | RESPIRATORY_TRACT | Status: DC
  Administered 2013-01-28 – 2013-01-29 (×5): 4 via RESPIRATORY_TRACT
  Filled 2013-01-28: qty 6.7

## 2013-01-28 NOTE — ED Notes (Signed)
SOB began yesterday. RA of 77% yesterday. Pt is diaphoretic with labored breathing. Currently has had albuterol x 2 with 2nd still in progress. Solumedrol 125mg  IV was also given en route. Current O2 sat with breathing tx in progress is 99%

## 2013-01-28 NOTE — ED Provider Notes (Signed)
CSN: [id number]     Arrival date & time 01/28/13  1243 History   This chart was scribed for Shelda Jakes, MD by Bennett Scrape, ED Scribe. This patient was seen in room AP18/AP18 and the patient's care was started at 12:46 PM.   Chief Complaint  Patient presents with  . Respiratory Distress   Level 5 Caveat- Respiratory Distress  The history is provided by the EMS personnel. No language interpreter was used.    HPI Comments: ABY GESSEL is a 62 y.o. female with a h/o COPD and asthma brought in by ambulance, who presents to the Emergency Department complaining of respiratory distress. Per EMS, pt was c/o SOB that has been gradually worsening since yesterday. EMS reports that the pt was 77% on RA upon their arrival. She received 2 albuterol and 125 mg of Solumedrol IV en route. She appears diaphoretic with labored breathing. EMS reports that she is on 2 L [place] O2 continuously ar home. Due to pt's current condition, she is unable to answer any further questions.  Past Medical History  Diagnosis Date  . COPD (chronic obstructive pulmonary disease)   . Asthma   . Bronchitis   . Anxiety   . Back pain   . Demand ischemia of myocardium 06/28/2011  . Thyroid enlargement 06/28/2011  . MI, acute, non ST segment elevation 06/29/2011    Myoview stress test revealed mild perfusion defect  . Ischemic cardiomyopathy 06/28/2011    EF 25%. Myoview stress test later showed ejection fraction of 65%  . PNA (pneumonia) 06/29/2011  . Acute respiratory failure 06/2011    Vent dependent sec to COPD/PNA  . Hypertension   . Coronary artery disease   . Myocardial infarction   . Pneumonia   . Diabetes mellitus without complication   . Arthritis   . Rectocele 10/09/2012   Past Surgical History  Procedure Laterality Date  . Esophagogastroduodenoscopy  Feb 2013    Dr. Bosie Clos: normal duodenum, candida esophagitis, reactive gastropathy  . Colonoscopy  Feb 2013    Dr. Bosie Clos: internal hemorrhoids,  normal view of ileum, multiple hyperplastic polyps  . Tubal ligation     Family History  Problem Relation Age of Onset  . Diabetes Mother   . Hypertension Mother   . Diabetes Father   . Hypertension Father   . Colon cancer Neg Hx    History  Substance Use Topics  . Smoking status: Current Every Day Smoker -- 0.50 packs/day for 44 years    Types: Cigarettes  . Smokeless tobacco: Never Used  . Alcohol Use: No   OB History   Grav Para Term Preterm Abortions TAB SAB Ect Mult Living   8 7   1  1   6      Review of Systems  Unable to perform ROS: Severe respiratory distress    Allergies  Review of patient's allergies indicates no known allergies.  Home Medications   Current Outpatient Rx  Name  Route  Sig  Dispense  Refill  . acidophilus (RISAQUAD) CAPS   Oral   Take 1 capsule by mouth daily.   30 capsule   5   . albuterol (PROVENTIL HFA;VENTOLIN HFA) 108 (90 BASE) MCG/ACT inhaler   Inhalation   Inhale 2 puffs into the lungs every 6 (six) hours as needed for wheezing or shortness of breath.         Marland Kitchen albuterol (PROVENTIL) (2.5 MG/3ML) 0.083% nebulizer solution   Nebulization   Take 2.5 mg by  nebulization 4 (four) times daily.         Marland Kitchen ALPRAZolam (XANAX) 0.5 MG tablet   Oral   Take 0.5 mg by mouth 2 (two) times daily as needed for anxiety.         . cetirizine (ZYRTEC) 10 MG tablet   Oral   Take 10 mg by mouth daily as needed for allergies.         . Fluticasone-Salmeterol (ADVAIR) 250-50 MCG/DOSE AEPB   Inhalation   Inhale 1 puff into the lungs every 12 (twelve) hours.   60 each   1   . furosemide (LASIX) 20 MG tablet   Oral   Take 20 mg by mouth daily.         Marland Kitchen gabapentin (NEURONTIN) 300 MG capsule   Oral   Take 300 mg by mouth 2 (two) times daily.         Marland Kitchen omeprazole (PRILOSEC) 20 MG capsule   Oral   Take 20 mg by mouth 2 (two) times daily.         Marland Kitchen oxyCODONE-acetaminophen (PERCOCET/ROXICET) 5-325 MG per tablet   Oral   Take 0.5  tablets by mouth 2 (two) times daily as needed for pain.         Marland Kitchen PARoxetine (PAXIL) 30 MG tablet   Oral   Take 1 tablet (30 mg total) by mouth every morning.   30 tablet   1   . Propylene Glycol (SYSTANE BALANCE) 0.6 % SOLN   Ophthalmic   Apply 1-2 drops to eye as needed.         . tiotropium (SPIRIVA) 18 MCG inhalation capsule   Inhalation   Place 1 capsule (18 mcg total) into inhaler and inhale daily.   30 capsule   1   . traZODone (DESYREL) 50 MG tablet   Oral   Take 25 mg by mouth at bedtime as needed.          . Vitamin D, Ergocalciferol, (DRISDOL) 50000 UNITS CAPS   Oral   Take 50,000 Units by mouth every 7 (seven) days. Takes every Monday         . calcium carbonate (OS-CAL - DOSED IN MG OF ELEMENTAL CALCIUM) 1250 MG tablet   Oral   Take 1 tablet by mouth daily.         . cyclobenzaprine (FLEXERIL) 5 MG tablet   Oral   Take 5 mg by mouth 2 (two) times daily as needed for muscle spasms.          . fluticasone (FLONASE) 50 MCG/ACT nasal spray   Nasal   Place 1-2 sprays into the nose daily.          . metFORMIN (GLUCOPHAGE) 500 MG tablet   Oral   Take 1 tablet (500 mg total) by mouth 2 (two) times daily with a meal.   60 tablet   12   . polyethylene glycol powder (GLYCOLAX/MIRALAX) powder   Oral   Take 17 g by mouth daily.   255 g   1   . rosuvastatin (CRESTOR) 10 MG tablet   Oral   Take 10 mg by mouth daily.          Triage Vitals: BP 162/120  Pulse 134  Resp 26  SpO2 99%  Physical Exam  Nursing note and vitals reviewed. Constitutional: She appears well-developed and well-nourished.  HENT:  Head: Normocephalic and atraumatic.  Eyes: Conjunctivae and EOM are normal.  Cardiovascular: Regular rhythm.  Tachycardia present.   Pulmonary/Chest: Accessory muscle usage present. She is in respiratory distress.  Musculoskeletal: Normal range of motion. She exhibits no edema.  Skin: Skin is warm and dry.  Psychiatric: She has a normal  mood and affect. Her behavior is normal.    ED Course  Procedures (including critical care time)  DIAGNOSTIC STUDIES: Oxygen Saturation is 99% on 100% percent oxygen.} by my interpretation.    COORDINATION OF CARE:   Labs Review Labs Reviewed  COMPREHENSIVE METABOLIC PANEL - Abnormal; Notable for the following:    Chloride 95 (*)    CO2 33 (*)    Glucose, Bld 205 (*)    All other components within normal limits  CBC WITH DIFFERENTIAL - Abnormal; Notable for the following:    WBC 14.4 (*)    RBC 5.13 (*)    HCT 46.2 (*)    Neutrophils Relative % 79 (*)    Neutro Abs 11.3 (*)    Eosinophils Absolute 0.8 (*)    All other components within normal limits  PRO B NATRIURETIC PEPTIDE - Abnormal; Notable for the following:    Pro B Natriuretic peptide (BNP) 189.4 (*)    All other components within normal limits  URINALYSIS, ROUTINE W REFLEX MICROSCOPIC - Abnormal; Notable for the following:    Hgb urine dipstick SMALL (*)    Protein, ur 30 (*)    All other components within normal limits  LIPASE, BLOOD  PROTIME-INR  TROPONIN I  URINE MICROSCOPIC-ADD ON  BLOOD GAS, ARTERIAL   Results for orders placed during the hospital encounter of 01/28/13  COMPREHENSIVE METABOLIC PANEL      Result Value Range   Sodium 137  135 - 145 mEq/L   Potassium 4.3  3.5 - 5.1 mEq/L   Chloride 95 (*) 96 - 112 mEq/L   CO2 33 (*) 19 - 32 mEq/L   Glucose, Bld 205 (*) 70 - 99 mg/dL   BUN 9  6 - 23 mg/dL   Creatinine, Ser 1.61  0.50 - 1.10 mg/dL   Calcium 9.2  8.4 - 09.6 mg/dL   Total Protein 7.5  6.0 - 8.3 g/dL   Albumin 3.7  3.5 - 5.2 g/dL   AST 12  0 - 37 U/L   ALT 7  0 - 35 U/L   Alkaline Phosphatase 96  39 - 117 U/L   Total Bilirubin 0.4  0.3 - 1.2 mg/dL   GFR calc non Af Amer >90  >90 mL/min   GFR calc Af Amer >90  >90 mL/min  LIPASE, BLOOD      Result Value Range   Lipase 14  11 - 59 U/L  CBC WITH DIFFERENTIAL      Result Value Range   WBC 14.4 (*) 4.0 - 10.5 K/uL   RBC 5.13 (*) 3.87  - 5.11 MIL/uL   Hemoglobin 14.3  12.0 - 15.0 g/dL   HCT 04.5 (*) 40.9 - 81.1 %   MCV 90.1  78.0 - 100.0 fL   MCH 27.9  26.0 - 34.0 pg   MCHC 31.0  30.0 - 36.0 g/dL   RDW 91.4  78.2 - 95.6 %   Platelets 238  150 - 400 K/uL   Neutrophils Relative % 79 (*) 43 - 77 %   Neutro Abs 11.3 (*) 1.7 - 7.7 K/uL   Lymphocytes Relative 13  12 - 46 %   Lymphs Abs 1.8  0.7 - 4.0 K/uL   Monocytes Relative 3  3 -  12 %   Monocytes Absolute 0.4  0.1 - 1.0 K/uL   Eosinophils Relative 5  0 - 5 %   Eosinophils Absolute 0.8 (*) 0.0 - 0.7 K/uL   Basophils Relative 0  0 - 1 %   Basophils Absolute 0.0  0.0 - 0.1 K/uL  PROTIME-INR      Result Value Range   Prothrombin Time 13.7  11.6 - 15.2 seconds   INR 1.07  0.00 - 1.49  TROPONIN I      Result Value Range   Troponin I <0.30  <0.30 ng/mL  PRO B NATRIURETIC PEPTIDE      Result Value Range   Pro B Natriuretic peptide (BNP) 189.4 (*) 0 - 125 pg/mL  URINALYSIS, ROUTINE W REFLEX MICROSCOPIC      Result Value Range   Color, Urine YELLOW  YELLOW   APPearance CLEAR  CLEAR   Specific Gravity, Urine 1.025  1.005 - 1.030   pH 6.0  5.0 - 8.0   Glucose, UA NEGATIVE  NEGATIVE mg/dL   Hgb urine dipstick SMALL (*) NEGATIVE   Bilirubin Urine NEGATIVE  NEGATIVE   Ketones, ur NEGATIVE  NEGATIVE mg/dL   Protein, ur 30 (*) NEGATIVE mg/dL   Urobilinogen, UA 0.2  0.0 - 1.0 mg/dL   Nitrite NEGATIVE  NEGATIVE   Leukocytes, UA NEGATIVE  NEGATIVE  URINE MICROSCOPIC-ADD ON      Result Value Range   WBC, UA 0-2  <3 WBC/hpf   RBC / HPF 0-2  <3 RBC/hpf    Date: 01/28/2013  Rate: 104  Rhythm: sinus tachycardia  QRS Axis: right  Intervals: normal  ST/T Wave abnormalities: normal  Conduction Disutrbances:none  Narrative Interpretation:   Old EKG Reviewed: unchanged Previous EKGs from earlier today had marked artifact. This EKG still has some artifact in the inferior leads. Pulmonary disease pattern. Right superior axis deviation.  Imaging Review Dg Chest Portable 1  View  01/28/2013   CLINICAL DATA:  Patient intubated. Respiratory distress.  EXAM: PORTABLE CHEST - 1 VIEW  COMPARISON:  12/05/2012  FINDINGS: The heart size is normal. There are perihilar peribronchial changes. No focal consolidations or pleural effusions. Endotracheal tube is in place but the tip is difficult to visualize. Endotracheal tube is best localized to the trachea at the level of 3.9 cm above carina.  IMPRESSION: 1. Endotracheal tube in place, tip difficult to visualize. See above. 2. Mild perihilar peribronchial changes.   Electronically Signed   By: Rosalie Gums M.D.   On: 01/28/2013 13:39    CRITICAL CARE Performed by: Shelda Jakes. Total critical care time: 60 Critical care time was exclusive of separately billable procedures and treating other patients. Critical care was necessary to treat or prevent imminent or life-threatening deterioration. Critical care was time spent personally by me on the following activities: development of treatment plan with patient and/or surrogate as well as nursing, discussions with consultants, evaluation of patient's response to treatment, examination of patient, obtaining history from patient or surrogate, ordering and performing treatments and interventions, ordering and review of laboratory studies, ordering and review of radiographic studies, pulse oximetry and re-evaluation of patient's condition.  Patient required intubation. Emergency intubation. Rapid sequence medications were used patient got accommodate 20 mg succinylcholine 120 mg was intubated with the glide a scope. Tube size cuff to 7.5 mm. Following intubation patient had good fogging of the tube equal breath sounds bilaterally sats improved.  Patient sedated with propofol drip.  Intubation was required for respiratory failure patient  was satting 99% on percent oxygen however was not mentating was not moving air well. MDM   1. Respiratory failure   2. COPD (chronic obstructive  pulmonary disease)    Patient brought in by EMS patient was satting 77% at home treated with albuterol nebulizers x2 was given [facility] by EMS. Patient has a history of bad COPD he is a normally on 2 L of oxygen at home. Patient was not mentating well and they got there patient brought in here patient still not mentating well gasping for air sats were improved on the oxygen though she was 99% suspect that her PCO2 got markedly elevated altering her mental status was not protecting her airway well intubation was required. Workup since then has show no evidence of pneumonia or urinary tract infection or evidence of infection anywhere. Suspect an acute respiratory failure due to COPD. Patient will be admitted to the ICU. Patient primary care doctors Dr. Juanetta Gosling.   I personally performed the services described in this documentation, which was scribed in my presence. The recorded information has been reviewed and is accurate.      Shelda Jakes, MD 01/28/13 [phone]

## 2013-01-28 NOTE — ED Notes (Signed)
Diprivan stopped at this time, first liter has left EDP aware.

## 2013-01-28 NOTE — ED Notes (Signed)
Diprivan started again per vo dr Deretha Emory

## 2013-01-28 NOTE — Procedures (Signed)
Intubation Procedure Note Kristin Wells [id number] [birth year]  Procedure: Intubation Indications: Respiratory insufficiency Procedure Details Consent: Unable to obtain consent because of emergent medical necessity. Time Out: Verified patient identification, verified procedure, site/side was marked, verified correct patient position, special equipment/implants available, medications/allergies/relevent history reviewed, required imaging and test results available.  Performed  Maximum sterile technique was used including antiseptics, gloves and hand hygiene.  MAC and 3    Evaluation Hemodynamic Status: Transient hypotension treated with fluid; O2 sats: stable throughout Patient's Current Condition: stable Complications: No apparent complications Patient did tolerate procedure well. Chest X-ray ordered to verify placement.  CXR: pending.   Katheren Shams 01/28/2013

## 2013-01-28 NOTE — H&P (Signed)
History and Physical  Kristin Wells AVW:[id number] DOB: 1951/02/14 DOA: 01/28/2013  Referring physician: Dr. Deretha Emory PCP: Fredirick Maudlin, MD   Chief Complaint: Shortness of breath  HPI:  62 year old woman with history of COPD and previous admissions for respiratory distress related to the emergency department with severe respiratory distress requiring intubation on arrival. Clinical exam, history and evaluation suggested COPD exacerbation as etiology. After stabilization on mechanical ventilation and sedation patient was referred for admission.  History obtained from chart, EDP. Patient can provide no history. EMS was called today for shortness of breath. Patient was treated with albuterol and Solu-Medrol in route noted to be hypoxic.  In the emergency department the patient was quickly intubated. Initially hypertensive. ABG revealed hypercapnia with respiratory compensation. Complete metabolic panel is unremarkable. CBC with leukocytosis (after steroid administration).  Review of Systems:  Unobtainable   Past Medical History  Diagnosis Date  . COPD (chronic obstructive pulmonary disease)   . Asthma   . Bronchitis   . Anxiety   . Back pain   . Demand ischemia of myocardium 06/28/2011  . Thyroid enlargement 06/28/2011  . MI, acute, non ST segment elevation 06/29/2011    Myoview stress test revealed mild perfusion defect  . Ischemic cardiomyopathy 06/28/2011    EF 25%. Myoview stress test later showed ejection fraction of 65%  . PNA (pneumonia) 06/29/2011  . Acute respiratory failure 06/2011    Vent dependent sec to COPD/PNA  . Hypertension   . Coronary artery disease   . Myocardial infarction   . Pneumonia   . Diabetes mellitus without complication   . Arthritis   . Rectocele 10/09/2012    Past Surgical History  Procedure Laterality Date  . Esophagogastroduodenoscopy  Feb 2013    Dr. Bosie Clos: normal duodenum, candida esophagitis, reactive gastropathy  . Colonoscopy  Feb  2013    Dr. Bosie Clos: internal hemorrhoids, normal view of ileum, multiple hyperplastic polyps  . Tubal ligation      Social History:  reports that she has been smoking Cigarettes.  She has a 22 pack-year smoking history. She has never used smokeless tobacco. She reports that she does not drink alcohol or use illicit drugs.  No Known Allergies  Family History  Problem Relation Age of Onset  . Diabetes Mother   . Hypertension Mother   . Diabetes Father   . Hypertension Father   . Colon cancer Neg Hx      Prior to Admission medications   Medication Sig Start Date End Date Taking? Authorizing Provider  acidophilus (RISAQUAD) CAPS Take 1 capsule by mouth daily. 08/02/12  Yes Fredirick Maudlin, MD  albuterol (PROVENTIL HFA;VENTOLIN HFA) 108 (90 BASE) MCG/ACT inhaler Inhale 2 puffs into the lungs every 6 (six) hours as needed for wheezing or shortness of breath.   Yes Historical Provider, MD  albuterol (PROVENTIL) (2.5 MG/3ML) 0.083% nebulizer solution Take 2.5 mg by nebulization 4 (four) times daily. 07/14/11  Yes Erick Blinks, MD  ALPRAZolam [name]) 0.5 MG tablet Take 0.5 mg by mouth 2 (two) times daily as needed for anxiety. 10/05/12  Yes Fredirick Maudlin, MD  cetirizine (ZYRTEC) 10 MG tablet Take 10 mg by mouth daily as needed for allergies.   Yes Historical Provider, MD  Fluticasone-Salmeterol (ADVAIR) 250-50 MCG/DOSE AEPB Inhale 1 puff into the lungs every 12 (twelve) hours. 07/14/11  Yes Erick Blinks, MD  furosemide (LASIX) 20 MG tablet Take 20 mg by mouth daily.   Yes Historical Provider, MD  gabapentin (NEURONTIN) 300 MG  capsule Take 300 mg by mouth 2 (two) times daily. 07/14/11  Yes Erick Blinks, MD  omeprazole (PRILOSEC) 20 MG capsule Take 20 mg by mouth 2 (two) times daily.   Yes Historical Provider, MD  oxyCODONE-acetaminophen (PERCOCET/ROXICET) 5-325 MG per tablet Take 0.5 tablets by mouth 2 (two) times daily as needed for pain.   Yes Historical Provider, MD  PARoxetine (PAXIL) 30  MG tablet Take 1 tablet (30 mg total) by mouth every morning. 07/14/11  Yes Erick Blinks, MD  Propylene Glycol (SYSTANE BALANCE) 0.6 % SOLN Apply 1-2 drops to eye as needed.   Yes Historical Provider, MD  tiotropium (SPIRIVA) 18 MCG inhalation capsule Place 1 capsule (18 mcg total) into inhaler and inhale daily. 07/14/11  Yes Erick Blinks, MD  traZODone (DESYREL) 50 MG tablet Take 25 mg by mouth at bedtime as needed.    Yes Historical Provider, MD  Vitamin D, Ergocalciferol, (DRISDOL) 50000 UNITS CAPS Take 50,000 Units by mouth every 7 (seven) days. Takes every Monday   Yes Historical Provider, MD  calcium carbonate (OS-CAL - DOSED IN MG OF ELEMENTAL CALCIUM) 1250 MG tablet Take 1 tablet by mouth daily.    Historical Provider, MD  cyclobenzaprine (FLEXERIL) 5 MG tablet Take 5 mg by mouth 2 (two) times daily as needed for muscle spasms.     Historical Provider, MD  fluticasone (FLONASE) 50 MCG/ACT nasal spray Place 1-2 sprays into the nose daily.  06/18/12   Historical Provider, MD  metFORMIN (GLUCOPHAGE) 500 MG tablet Take 1 tablet (500 mg total) by mouth 2 (two) times daily with a meal. 10/05/12   Fredirick Maudlin, MD  polyethylene glycol powder (GLYCOLAX/MIRALAX) powder Take 17 g by mouth daily. 07/14/11   Erick Blinks, MD  rosuvastatin (CRESTOR) 10 MG tablet Take 10 mg by mouth daily.    Historical Provider, MD   Physical Exam: Filed Vitals:   01/28/13 1430 01/28/13 1435 01/28/13 1450 01/28/13 1600  BP: 114/84 105/82 104/88 126/97  Pulse: 97 99 99 103  Temp: 97.6 F (36.4 C) 97.6 F (36.4 C) 97.7 F (36.5 C) 98.6 F (37 C)  TempSrc:      Resp: 16 16 16 16   SpO2: 100% 100% 100% 100%    General: Examined in the emergency department. Appears calm and comfortable. She briefly arouses to voice, does grip bilaterally to command. She is intubated but appears stable. Eyes: PERRL, normal lids, irises  ENT: grossly normal hearing, lips & tongue Neck: no LAD, masses or  thyromegaly Cardiovascular: RRR, no m/r/g. No LE edema. Respiratory: Diffuse expiratory wheezes, prolonged expiration, very poor air movement. Abdomen: soft, ntnd Skin: no rash or induration seen  Musculoskeletal: Cannot completely assess but upper extremities but to command. She is currently sedated on Diprivan. Psychiatric: Cannot assess. Neurologic: grossly non-focal.  Wt Readings from Last 3 Encounters:  11/15/12 63.504 kg (140 lb)  10/09/12 68.04 kg (150 lb)  10/05/12 70.806 kg (156 lb 1.6 oz)    Labs on Admission:  Basic Metabolic Panel:  Recent Labs Lab 01/28/13 1406  NA 137  K 4.3  CL 95*  CO2 33*  GLUCOSE 205*  BUN 9  CREATININE 0.71  CALCIUM 9.2    Liver Function Tests:  Recent Labs Lab 01/28/13 1406  AST 12  ALT 7  ALKPHOS 96  BILITOT 0.4  PROT 7.5  ALBUMIN 3.7    Recent Labs Lab 01/28/13 1406  LIPASE 14    CBC:  Recent Labs Lab 01/28/13 1406  WBC 14.4*  NEUTROABS 11.3*  HGB 14.3  HCT 46.2*  MCV 90.1  PLT 238    Cardiac Enzymes:  Recent Labs Lab 01/28/13 1406  TROPONINI <0.30    Radiological Exams on Admission: Dg Chest Portable 1 View  01/28/2013   CLINICAL DATA:  Patient intubated. Respiratory distress.  EXAM: PORTABLE CHEST - 1 VIEW  COMPARISON:  12/05/2012  FINDINGS: The heart size is normal. There are perihilar peribronchial changes. No focal consolidations or pleural effusions. Endotracheal tube is in place but the tip is difficult to visualize. Endotracheal tube is best localized to the trachea at the level of 3.9 cm above carina.  IMPRESSION: 1. Endotracheal tube in place, tip difficult to visualize. See above. 2. Mild perihilar peribronchial changes.   Electronically Signed   By: Rosalie Gums M.D.   On: 01/28/2013 13:39    EKG: Independently reviewed. Sinus tachycardia. Right axis deviation. No acute changes.   Principal Problem:   Acute respiratory failure with hypercapnia Active Problems:   COPD with acute  exacerbation   Chronic respiratory failure   Diabetes   Assessment/Plan 1. Acute hypercapnic respiratory failure: Presumably secondary to COPD exacerbation. Admit to the ICU, continue full mechanical ventilation. Consult pulmonology. 2. COPD exacerbation: Steroids, bronchodilators, mechanical ventilation, antibiotics.  3. History of coronary artery disease, ischemic cardiomyopathy: Normal ejection fraction by nuclear stress 06/2011. 4. Diabetes mellitus: Sliding scale insulin.  Currently appears stabilized on ventilator without evidence of air trapping. Hemodynamic stable. Has adequate IV access. Critically ill.  Code Status: Full code  DVT prophylaxis:Lovenxo Family Communication: none present  Disposition Plan/Anticipated LOS: admission, 3-5 days  Time spent: 60 minutes  Brendia Sacks, MD  [facility] Pager [phone] 01/28/2013, 4:29 PM

## 2013-01-29 ENCOUNTER — Inpatient Hospital Stay (HOSPITAL_COMMUNITY): Payer: PRIVATE HEALTH INSURANCE

## 2013-01-29 LAB — BASIC METABOLIC PANEL
BUN: 11 mg/dL (ref 6–23)
CO2: 27 mEq/L (ref 19–32)
Calcium: 9.6 mg/dL (ref 8.4–10.5)
Chloride: 100 mEq/L (ref 96–112)
Creatinine, Ser: 0.78 mg/dL (ref 0.50–1.10)
GFR calc Af Amer: >90 mL/min (ref >90)
GFR calc non Af Amer: 88 mL/min — ABNORMAL LOW (ref >90)
Glucose, Bld: 152 mg/dL — ABNORMAL HIGH (ref 70–99)
Potassium: 3.7 mEq/L (ref 3.5–5.1)
Sodium: 140 mEq/L (ref 135–145)

## 2013-01-29 LAB — CBC
HCT: 44 % (ref 36.0–46.0)
Hemoglobin: 14.4 g/dL (ref 12.0–15.0)
MCH: 28.1 pg (ref 26.0–34.0)
MCHC: 32.7 g/dL (ref 30.0–36.0)
MCV: 85.9 fL (ref 78.0–100.0)
Platelets: 252 10*3/uL (ref 150–400)
RBC: 5.12 MIL/uL — ABNORMAL HIGH (ref 3.87–5.11)
RDW: 14.4 % (ref 11.5–15.5)
WBC: 6.4 10*3/uL (ref 4.0–10.5)

## 2013-01-29 LAB — GLUCOSE, CAPILLARY
Glucose-Capillary: 143 mg/dL — ABNORMAL HIGH (ref 70–99)
Glucose-Capillary: 136 mg/dL — ABNORMAL HIGH (ref 70–99)
Glucose-Capillary: 161 mg/dL — ABNORMAL HIGH (ref 70–99)
Glucose-Capillary: 129 mg/dL — ABNORMAL HIGH (ref 70–99)
Glucose-Capillary: 131 mg/dL — ABNORMAL HIGH (ref 70–99)
Glucose-Capillary: 125 mg/dL — ABNORMAL HIGH (ref 70–99)

## 2013-01-29 MED ORDER — IPRATROPIUM BROMIDE 0.02 % IN SOLN
0.5000 mg | RESPIRATORY_TRACT | Status: DC
  Administered 2013-01-29 – 2013-02-01 (×18): 0.5 mg via RESPIRATORY_TRACT
  Filled 2013-01-29 (×18): qty 2.5

## 2013-01-29 MED ORDER — ALBUTEROL SULFATE (5 MG/ML) 0.5% IN NEBU
2.5000 mg | INHALATION_SOLUTION | RESPIRATORY_TRACT | Status: DC | PRN
Start: 2013-01-29 — End: 2013-02-01

## 2013-01-29 MED ORDER — PNEUMOCOCCAL VAC POLYVALENT 25 MCG/0.5ML IJ INJ
0.5000 mL | INJECTION | INTRAMUSCULAR | Status: AC
  Filled 2013-01-29: qty 0.5

## 2013-01-29 MED ORDER — DIPHENHYDRAMINE HCL 50 MG/ML IJ SOLN
50.0000 mg | Freq: Once | INTRAMUSCULAR | Status: AC
  Administered 2013-01-29: 50 mg via INTRAVENOUS
  Filled 2013-01-29: qty 1

## 2013-01-29 MED ORDER — INFLUENZA VAC SPLIT QUAD 0.5 ML IM SUSP
0.5000 mL | INTRAMUSCULAR | Status: AC
  Administered 2013-01-30: 0.5 mL via INTRAMUSCULAR
  Filled 2013-01-29: qty 0.5

## 2013-01-29 MED ORDER — ALBUTEROL SULFATE (5 MG/ML) 0.5% IN NEBU
2.5000 mg | INHALATION_SOLUTION | RESPIRATORY_TRACT | Status: DC
  Administered 2013-01-29 – 2013-02-01 (×18): 2.5 mg via RESPIRATORY_TRACT
  Filled 2013-01-29 (×18): qty 0.5

## 2013-01-29 NOTE — Progress Notes (Signed)
Subjective: She was admitted yesterday with acute respiratory failure. She has severe COPD.  Objective: Vital signs in last 24 hours: Temp:  [97 F (36.1 C)-100.2 F (37.9 C)] 100.2 F (37.9 C) (09/23 0743) Pulse Rate:  [93-134] 112 (09/23 0600) Resp:  [16-27] 19 (09/23 0600) BP: (74-162)/(47-120) 125/78 mmHg (09/23 0600) SpO2:  [91 %-100 %] 99 % (09/23 0733) FiO2 (%):  [30 %-100 %] 30 % (09/23 0747) Weight:  [68 kg (149 lb 14.6 oz)-68.6 kg (151 lb 3.8 oz)] 68.6 kg (151 lb 3.8 oz) (09/23 0458) Weight change:  Last BM Date:  (unknown-pt intubated)  Intake/Output from previous day: 09/22 0701 - 09/23 0700 In: 1175 [I.V.:1075; IV Piggyback:100] Out: 1350 [Urine:1350]  PHYSICAL EXAM General appearance: alert, cooperative, moderate distress and Intubated and on a ventilator but she is undergoing wakeup assessment so she is awake Resp: Her chest is relatively clear with prolonged expiratory phase Cardio: regular rate and rhythm, S1, S2 normal, no murmur, click, rub or gallop GI: soft, non-tender; bowel sounds normal; no masses,  no organomegaly Extremities: Trace edema  Lab Results:    Basic Metabolic Panel:  Recent Labs  16/10/96 1406 01/29/13 0441  NA 137 140  K 4.3 3.7  CL 95* 100  CO2 33* 27  GLUCOSE 205* 152*  BUN 9 11  CREATININE 0.71 0.78  CALCIUM 9.2 9.6   Liver Function Tests:  Recent Labs  01/28/13 1406  AST 12  ALT 7  ALKPHOS 96  BILITOT 0.4  PROT 7.5  ALBUMIN 3.7    Recent Labs  01/28/13 1406  LIPASE 14   No results found for this basename: AMMONIA,  in the last 72 hours CBC:  Recent Labs  01/28/13 1406 01/29/13 0441  WBC 14.4* 6.4  NEUTROABS 11.3*  --   HGB 14.3 14.4  HCT 46.2* 44.0  MCV 90.1 85.9  PLT 238 252   Cardiac Enzymes:  Recent Labs  01/28/13 1406  TROPONINI <0.30   BNP:  Recent Labs  01/28/13 1406  PROBNP 189.4*   D-Dimer: No results found for this basename: DDIMER,  in the last 72 hours CBG:  Recent  Labs  01/28/13 1710 01/28/13 2301 01/29/13 0451 01/29/13 0741  GLUCAP 164* 150* 143* 136*   Hemoglobin A1C: No results found for this basename: HGBA1C,  in the last 72 hours Fasting Lipid Panel: No results found for this basename: CHOL, HDL, LDLCALC, TRIG, CHOLHDL, LDLDIRECT,  in the last 72 hours Thyroid Function Tests: No results found for this basename: TSH, T4TOTAL, FREET4, T3FREE, THYROIDAB,  in the last 72 hours Anemia Panel: No results found for this basename: VITAMINB12, FOLATE, FERRITIN, TIBC, IRON, RETICCTPCT,  in the last 72 hours Coagulation:  Recent Labs  01/28/13 1406  LABPROT 13.7  INR 1.07   Urine Drug Screen: Drugs of Abuse     Component Value Date/Time   LABOPIA NONE DETECTED 07/30/2011 0452   COCAINSCRNUR NONE DETECTED 07/30/2011 0452   LABBENZ POSITIVE* 07/30/2011 0452   AMPHETMU NONE DETECTED 07/30/2011 0452   THCU NONE DETECTED 07/30/2011 0452   LABBARB NONE DETECTED 07/30/2011 0452    Alcohol Level: No results found for this basename: ETH,  in the last 72 hours Urinalysis:  Recent Labs  01/28/13 1406  COLORURINE YELLOW  LABSPEC 1.025  PHURINE 6.0  GLUCOSEU NEGATIVE  HGBUR SMALL*  BILIRUBINUR NEGATIVE  KETONESUR NEGATIVE  PROTEINUR 30*  UROBILINOGEN 0.2  NITRITE NEGATIVE  LEUKOCYTESUR NEGATIVE   Misc. Labs:  ABGS  Recent Labs  01/29/13 0500  PHART 7.502*  PO2ART 133.0*  TCO2 23.4  HCO3 27.3*   CULTURES Recent Results (from the past 240 hour(s))  MRSA PCR SCREENING     Status: None   Collection Time    01/28/13  5:40 PM      Result Value Range Status   MRSA by PCR NEGATIVE  NEGATIVE Final   Comment:            The GeneXpert MRSA Assay (FDA     approved for NASAL specimens     only), is one component of a     comprehensive MRSA colonization     surveillance program. It is not     intended to diagnose MRSA     infection nor to guide or     monitor treatment for     MRSA infections.   Studies/Results: Portable Chest Xray  In Am  01/29/2013   CLINICAL DATA:  COPD exacerbation, respiratory failure  EXAM: PORTABLE CHEST - 1 VIEW  COMPARISON:  01/28/2013  FINDINGS: Endotracheal tube terminates 4 cm above the carina.  Chronic interstitial markings/emphysematous changes. No superimposed opacities suspicious for pneumonia. No pleural effusion or pneumothorax.  The heart is normal in size.  Enteric tube coursing below the diaphragm.  IMPRESSION: Endotracheal tube terminates 4 cm above the carina.   Electronically Signed   By: Charline Bills M.D.   On: 01/29/2013 07:59   Dg Chest Portable 1 View  01/28/2013   CLINICAL DATA:  Patient intubated. Respiratory distress.  EXAM: PORTABLE CHEST - 1 VIEW  COMPARISON:  12/05/2012  FINDINGS: The heart size is normal. There are perihilar peribronchial changes. No focal consolidations or pleural effusions. Endotracheal tube is in place but the tip is difficult to visualize. Endotracheal tube is best localized to the trachea at the level of 3.9 cm above carina.  IMPRESSION: 1. Endotracheal tube in place, tip difficult to visualize. See above. 2. Mild perihilar peribronchial changes.   Electronically Signed   By: Rosalie Gums M.D.   On: 01/28/2013 13:39    Medications:  Prior to Admission:  Prescriptions prior to admission  Medication Sig Dispense Refill  . acidophilus (RISAQUAD) CAPS Take 1 capsule by mouth daily.  30 capsule  5  . albuterol (PROVENTIL HFA;VENTOLIN HFA) 108 (90 BASE) MCG/ACT inhaler Inhale 2 puffs into the lungs every 6 (six) hours as needed for wheezing or shortness of breath.      Marland Kitchen albuterol (PROVENTIL) (2.5 MG/3ML) 0.083% nebulizer solution Take 2.5 mg by nebulization 4 (four) times daily.      Marland Kitchen ALPRAZolam (XANAX) 0.5 MG tablet Take 0.5 mg by mouth 2 (two) times daily as needed for anxiety.      . cetirizine (ZYRTEC) 10 MG tablet Take 10 mg by mouth daily as needed for allergies.      . Fluticasone-Salmeterol (ADVAIR) 250-50 MCG/DOSE AEPB Inhale 1 puff into the lungs  every 12 (twelve) hours.  60 each  1  . furosemide (LASIX) 20 MG tablet Take 20 mg by mouth daily.      Marland Kitchen gabapentin (NEURONTIN) 300 MG capsule Take 300 mg by mouth 2 (two) times daily.      Marland Kitchen omeprazole (PRILOSEC) 20 MG capsule Take 20 mg by mouth 2 (two) times daily.      Marland Kitchen oxyCODONE-acetaminophen (PERCOCET/ROXICET) 5-325 MG per tablet Take 0.5 tablets by mouth 2 (two) times daily as needed for pain.      Marland Kitchen PARoxetine (PAXIL) 30 MG tablet Take 1  tablet (30 mg total) by mouth every morning.  30 tablet  1  . Propylene Glycol (SYSTANE BALANCE) 0.6 % SOLN Apply 1-2 drops to eye as needed.      . tiotropium (SPIRIVA) 18 MCG inhalation capsule Place 1 capsule (18 mcg total) into inhaler and inhale daily.  30 capsule  1  . traZODone (DESYREL) 50 MG tablet Take 25 mg by mouth at bedtime as needed.       . Vitamin D, Ergocalciferol, (DRISDOL) 50000 UNITS CAPS Take 50,000 Units by mouth every 7 (seven) days. Takes every Monday      . calcium carbonate (OS-CAL - DOSED IN MG OF ELEMENTAL CALCIUM) 1250 MG tablet Take 1 tablet by mouth daily.      . cyclobenzaprine (FLEXERIL) 5 MG tablet Take 5 mg by mouth 2 (two) times daily as needed for muscle spasms.       . fluticasone (FLONASE) 50 MCG/ACT nasal spray Place 1-2 sprays into the nose daily.       . metFORMIN (GLUCOPHAGE) 500 MG tablet Take 1 tablet (500 mg total) by mouth 2 (two) times daily with a meal.  60 tablet  12  . polyethylene glycol powder (GLYCOLAX/MIRALAX) powder Take 17 g by mouth daily.  255 g  1  . rosuvastatin (CRESTOR) 10 MG tablet Take 10 mg by mouth daily.       Scheduled: . albuterol  4 puff Inhalation Q4H  . antiseptic oral rinse  15 mL Mouth Rinse QID  . chlorhexidine  15 mL Mouth Rinse BID  . enoxaparin (LOVENOX) injection  40 mg Subcutaneous Q24H  . [START ON 01/30/2013] influenza vac split quadrivalent PF  0.5 mL Intramuscular Tomorrow-1000  . insulin aspart  0-9 Units Subcutaneous Q6H  . ipratropium  4 puff Inhalation Q4H  .  levofloxacin (LEVAQUIN) IV  500 mg Intravenous Q24H  . methylPREDNISolone (SOLU-MEDROL) injection  40 mg Intravenous Q6H  . pantoprazole (PROTONIX) IV  40 mg Intravenous Daily  . [START ON 01/30/2013] pneumococcal 23 valent vaccine  0.5 mL Intramuscular Tomorrow-1000  . sodium chloride  3 mL Intravenous Q12H   Continuous: . sodium chloride 75 mL/hr at 01/29/13 0807   MVH:[id number], acetaminophen, albuterol, fentaNYL, midazolam, ondansetron (ZOFRAN) IV, ondansetron  Assesment: She was admitted with acute respiratory failure. She has COPD exacerbation. She does have chronic respiratory failure. She is on the ventilator but looks better and may be able to be extubated. She has diabetes and that's pre-well controlled. She's had problems with anxiety and depression and that is unchanged. She has chronic abdominal pain which is unchanged Principal Problem:   Acute respiratory failure with hypercapnia Active Problems:   COPD with acute exacerbation   Chronic respiratory failure   Diabetes    Plan: Attempt extubation.    LOS: 1 day   Cortne Amara L 01/29/2013, 8:38 AM

## 2013-01-29 NOTE — Progress Notes (Signed)
Pt extubated per MD order and placed on 3L nasal cannula.  Pt tolerating well at this time.  RT will continue to monitor.

## 2013-01-29 NOTE — Progress Notes (Signed)
eLink Physician-Brief Progress Note Patient Name: YASLIN KIRTLEY DOB: 26-Feb-1951 MRN: [id number]  Date of Service  01/29/2013   HPI/Events of Note     eICU Interventions  Benadryl for sleep    Intervention Category Minor Interventions: Routine modifications to care plan (e.g. PRN medications for pain, fever)  Emi Lymon S. 01/29/2013, 9:03 PM

## 2013-01-29 NOTE — Progress Notes (Signed)
UR chart review completed.  

## 2013-01-29 NOTE — Care Management Note (Signed)
    Page 1 of 2   02/01/2013     9:31:09 AM   CARE MANAGEMENT NOTE 02/01/2013  Patient:  Kristin Wells, Kristin Wells   Account Number:  [id number]  Date Initiated:  01/29/2013  Documentation initiated by:  Sharrie Rothman  Subjective/Objective Assessment:   Pt admitted from home with respiratory distress and on vent. Pt lives alone and has a CAP aide 7 days a week for 3 hours a day. Pt has home O2, rollator, neb machine, glucometer. Pt is followed by [facility] and [facility]     Action/Plan:   Pt may need HH RN at discharge. Pt is agreeable. Will continue to follow.   Anticipated DC Date:  02/01/2013   Anticipated DC Plan:  HOME W HOME HEALTH SERVICES      DC Planning Services  CM consult      [facility] Choice  HOME HEALTH   Choice offered to / List presented to:  C-1 Patient        HH arranged  HH-1 RN      [facility] agency  [facility]   Status of service:  Completed, signed off Medicare Important Message given?  YES (If response is "NO", the following Medicare IM given date fields will be blank) Date Medicare IM given:  02/01/2013 Date Additional Medicare IM given:    Discharge Disposition:  HOME W HOME HEALTH SERVICES  Per UR Regulation:    If discussed at Long Length of Stay Meetings, dates discussed:    Comments:  02/01/13 0930 Arlyss Queen, RN BSN CM Pt discharged home today with [facility] RN. Judeth Cornfield of [facility] given referral and will collect the pts information from the chart. Pt has no DME needs. [facility] will continue to follow as well. HH services to start within 48 hours of discharge. Pt and pts nurse aware of discharge arrangements.  01/29/13 1510 Arlyss Queen, RN BSN CM

## 2013-01-29 NOTE — Progress Notes (Signed)
Pt extubated per TO by Dr. Kari Baars. Pt tolerated well. Mouth care performed before extubation and after.

## 2013-01-29 NOTE — Progress Notes (Signed)
Per call to Dr. Kari Baars made him aware that pt is doing great on cpap settings at 5/5 and respiratory is recommending extubation and Dr. Juanetta Gosling agrees.

## 2013-01-29 NOTE — Progress Notes (Signed)
Pt placed on SBT C/PS 5/5 and tolerating well at this time.  RT will continue to monitor.

## 2013-01-30 LAB — GLUCOSE, CAPILLARY
Glucose-Capillary: 141 mg/dL — ABNORMAL HIGH (ref 70–99)
Glucose-Capillary: 120 mg/dL — ABNORMAL HIGH (ref 70–99)
Glucose-Capillary: 202 mg/dL — ABNORMAL HIGH (ref 70–99)
Glucose-Capillary: 146 mg/dL — ABNORMAL HIGH (ref 70–99)
Glucose-Capillary: 164 mg/dL — ABNORMAL HIGH (ref 70–99)

## 2013-01-30 LAB — HEMOGLOBIN A1C
Hgb A1c MFr Bld: 6 % — ABNORMAL HIGH (ref <5.7)
Mean Plasma Glucose: 126 mg/dL — ABNORMAL HIGH (ref <117)

## 2013-01-30 MED ORDER — ALPRAZOLAM 0.5 MG PO TABS
0.5000 mg | ORAL_TABLET | Freq: Four times a day (QID) | ORAL | Status: DC
  Administered 2013-01-30 – 2013-02-01 (×9): 0.5 mg via ORAL
  Filled 2013-01-30 (×9): qty 1

## 2013-01-30 MED ORDER — TRAZODONE HCL 50 MG PO TABS
50.0000 mg | ORAL_TABLET | Freq: Every day | ORAL | Status: DC
  Administered 2013-01-30 – 2013-01-31 (×2): 50 mg via ORAL
  Filled 2013-01-30 (×2): qty 1

## 2013-01-30 MED ORDER — RISAQUAD PO CAPS
1.0000 | ORAL_CAPSULE | Freq: Every day | ORAL | Status: DC
  Administered 2013-01-30 – 2013-02-01 (×3): 1 via ORAL
  Filled 2013-01-30 (×4): qty 1

## 2013-01-30 MED ORDER — MONTELUKAST SODIUM 10 MG PO TABS
10.0000 mg | ORAL_TABLET | Freq: Every day | ORAL | Status: DC
  Administered 2013-01-30 – 2013-01-31 (×2): 10 mg via ORAL
  Filled 2013-01-30 (×2): qty 1

## 2013-01-30 MED ORDER — POLYETHYLENE GLYCOL 3350 17 GM/SCOOP PO POWD: 17.0000 g | Freq: Every day | ORAL | Status: DC

## 2013-01-30 MED ORDER — ROFLUMILAST 500 MCG PO TABS
500.0000 ug | ORAL_TABLET | Freq: Every day | ORAL | Status: DC
  Administered 2013-01-30 – 2013-02-01 (×3): 500 ug via ORAL
  Filled 2013-01-30 (×6): qty 1

## 2013-01-30 MED ORDER — POLYETHYLENE GLYCOL 3350 17 G PO PACK
17.0000 g | PACK | Freq: Every day | ORAL | Status: DC
  Administered 2013-01-30 – 2013-01-31 (×2): 17 g via ORAL
  Filled 2013-01-30 (×3): qty 1

## 2013-01-30 MED ORDER — PAROXETINE HCL 20 MG PO TABS
30.0000 mg | ORAL_TABLET | ORAL | Status: DC
  Administered 2013-01-30 – 2013-02-01 (×3): 30 mg via ORAL
  Filled 2013-01-30 (×3): qty 2

## 2013-01-30 NOTE — Progress Notes (Signed)
Pt has been sitting up in chair for about 5 hours. Pt ambulated in hallway about 50 feet with RN on oxygen. O2 sats stayed >90% on 2 liters [place]. Pt tolerated well. Pt returned to bed. Call bell in reach. Will continue to monitor.

## 2013-01-30 NOTE — Progress Notes (Signed)
Pts prescription medications found in orange cooler in room with personal belongings. Medications counted with Thurnell Lose, RN. Home medication list filled out and sent to pharmacy.

## 2013-01-30 NOTE — Progress Notes (Signed)
Subjective: She says she feels better. She has no complaints. She wants to eat. She's having trouble with her nerves.  Objective: Vital signs in last 24 hours: Temp:  [98.2 F (36.8 C)] 98.2 F (36.8 C) (09/23 1939) Pulse Rate:  [83-124] 88 (09/24 0600) Resp:  [17-27] 17 (09/24 0600) BP: (92-120)/(55-83) 92/55 mmHg (09/24 0600) SpO2:  [90 %-99 %] 91 % (09/24 0757) FiO2 (%):  [35 %] 35 % (09/23 1108) Weight change:  Last BM Date: 01/26/13  Intake/Output from previous day: 09/23 0701 - 09/24 0700 In: 1825 [I.V.:1725; IV Piggyback:100] Out: 1350 [Urine:1050; Emesis/NG output:300]  PHYSICAL EXAM General appearance: alert, cooperative and mild distress Resp: clear to auscultation bilaterally Cardio: regular rate and rhythm, S1, S2 normal, no murmur, click, rub or gallop GI: soft, non-tender; bowel sounds normal; no masses,  no organomegaly Extremities: extremities normal, atraumatic, no cyanosis or edema  Lab Results:    Basic Metabolic Panel:  Recent Labs  16/10/96 1406 01/29/13 0441  NA 137 140  K 4.3 3.7  CL 95* 100  CO2 33* 27  GLUCOSE 205* 152*  BUN 9 11  CREATININE 0.71 0.78  CALCIUM 9.2 9.6   Liver Function Tests:  Recent Labs  01/28/13 1406  AST 12  ALT 7  ALKPHOS 96  BILITOT 0.4  PROT 7.5  ALBUMIN 3.7    Recent Labs  01/28/13 1406  LIPASE 14   No results found for this basename: AMMONIA,  in the last 72 hours CBC:  Recent Labs  01/28/13 1406 01/29/13 0441  WBC 14.4* 6.4  NEUTROABS 11.3*  --   HGB 14.3 14.4  HCT 46.2* 44.0  MCV 90.1 85.9  PLT 238 252   Cardiac Enzymes:  Recent Labs  01/28/13 1406  TROPONINI <0.30   BNP:  Recent Labs  01/28/13 1406  PROBNP 189.4*   D-Dimer: No results found for this basename: DDIMER,  in the last 72 hours CBG:  Recent Labs  01/29/13 1034 01/29/13 1729 01/29/13 1800 01/29/13 2256 01/30/13 0423 01/30/13 0728  GLUCAP 161* 129* 131* 125* 141* 120*   Hemoglobin A1C: No results  found for this basename: HGBA1C,  in the last 72 hours Fasting Lipid Panel: No results found for this basename: CHOL, HDL, LDLCALC, TRIG, CHOLHDL, LDLDIRECT,  in the last 72 hours Thyroid Function Tests: No results found for this basename: TSH, T4TOTAL, FREET4, T3FREE, THYROIDAB,  in the last 72 hours Anemia Panel: No results found for this basename: VITAMINB12, FOLATE, FERRITIN, TIBC, IRON, RETICCTPCT,  in the last 72 hours Coagulation:  Recent Labs  01/28/13 1406  LABPROT 13.7  INR 1.07   Urine Drug Screen: Drugs of Abuse     Component Value Date/Time   LABOPIA NONE DETECTED 07/30/2011 0452   COCAINSCRNUR NONE DETECTED 07/30/2011 0452   LABBENZ POSITIVE* 07/30/2011 0452   AMPHETMU NONE DETECTED 07/30/2011 0452   THCU NONE DETECTED 07/30/2011 0452   LABBARB NONE DETECTED 07/30/2011 0452    Alcohol Level: No results found for this basename: ETH,  in the last 72 hours Urinalysis:  Recent Labs  01/28/13 1406  COLORURINE YELLOW  LABSPEC 1.025  PHURINE 6.0  GLUCOSEU NEGATIVE  HGBUR SMALL*  BILIRUBINUR NEGATIVE  KETONESUR NEGATIVE  PROTEINUR 30*  UROBILINOGEN 0.2  NITRITE NEGATIVE  LEUKOCYTESUR NEGATIVE   Misc. Labs:  ABGS  Recent Labs  01/29/13 0500  PHART 7.502*  PO2ART 133.0*  TCO2 23.4  HCO3 27.3*   CULTURES Recent Results (from the past 240 hour(s))  MRSA PCR  SCREENING     Status: None   Collection Time    01/28/13  5:40 PM      Result Value Range Status   MRSA by PCR NEGATIVE  NEGATIVE Final   Comment:            The GeneXpert MRSA Assay (FDA     approved for NASAL specimens     only), is one component of a     comprehensive MRSA colonization     surveillance program. It is not     intended to diagnose MRSA     infection nor to guide or     monitor treatment for     MRSA infections.   Studies/Results: Portable Chest Xray In Am  01/29/2013   CLINICAL DATA:  COPD exacerbation, respiratory failure  EXAM: PORTABLE CHEST - 1 VIEW  COMPARISON:   01/28/2013  FINDINGS: Endotracheal tube terminates 4 cm above the carina.  Chronic interstitial markings/emphysematous changes. No superimposed opacities suspicious for pneumonia. No pleural effusion or pneumothorax.  The heart is normal in size.  Enteric tube coursing below the diaphragm.  IMPRESSION: Endotracheal tube terminates 4 cm above the carina.   Electronically Signed   By: Charline Bills M.D.   On: 01/29/2013 07:59   Dg Chest Portable 1 View  01/28/2013   CLINICAL DATA:  Patient intubated. Respiratory distress.  EXAM: PORTABLE CHEST - 1 VIEW  COMPARISON:  12/05/2012  FINDINGS: The heart size is normal. There are perihilar peribronchial changes. No focal consolidations or pleural effusions. Endotracheal tube is in place but the tip is difficult to visualize. Endotracheal tube is best localized to the trachea at the level of 3.9 cm above carina.  IMPRESSION: 1. Endotracheal tube in place, tip difficult to visualize. See above. 2. Mild perihilar peribronchial changes.   Electronically Signed   By: Rosalie Gums M.D.   On: 01/28/2013 13:39    Medications:  Prior to Admission:  Prescriptions prior to admission  Medication Sig Dispense Refill  . acidophilus (RISAQUAD) CAPS Take 1 capsule by mouth daily.  30 capsule  5  . albuterol (PROVENTIL HFA;VENTOLIN HFA) 108 (90 BASE) MCG/ACT inhaler Inhale 2 puffs into the lungs every 6 (six) hours as needed for wheezing or shortness of breath.      Marland Kitchen albuterol (PROVENTIL) (2.5 MG/3ML) 0.083% nebulizer solution Take 2.5 mg by nebulization 4 (four) times daily.      Marland Kitchen ALPRAZolam (XANAX) 0.5 MG tablet Take 0.5 mg by mouth 4 (four) times daily.      . Calcium Carbonate-Vitamin D (CALCIUM 600 + D PO) Take 1 tablet by mouth daily.      . cetirizine (ZYRTEC) 10 MG tablet Take 10 mg by mouth daily as needed for allergies.      . cyclobenzaprine (FLEXERIL) 5 MG tablet Take 5 mg by mouth 2 (two) times daily as needed for muscle spasms.       Marland Kitchen dexlansoprazole  (DEXILANT) 60 MG capsule Take 60 mg by mouth daily.      . Fluticasone-Salmeterol (ADVAIR) 250-50 MCG/DOSE AEPB Inhale 1 puff into the lungs every 12 (twelve) hours.  60 each  1  . gabapentin (NEURONTIN) 300 MG capsule Take 300 mg by mouth 2 (two) times daily.      . montelukast (SINGULAIR) 10 MG tablet Take 10 mg by mouth at bedtime.      Marland Kitchen oxyCODONE-acetaminophen (PERCOCET/ROXICET) 5-325 MG per tablet Take 0.5 tablets by mouth 2 (two) times daily as needed for pain.      Marland Kitchen  PARoxetine (PAXIL) 30 MG tablet Take 1 tablet (30 mg total) by mouth every morning.  30 tablet  1  . polyethylene glycol powder (GLYCOLAX/MIRALAX) powder Take 17 g by mouth daily.  255 g  1  . roflumilast (DALIRESP) 500 MCG TABS tablet Take 500 mcg by mouth daily.      . rosuvastatin (CRESTOR) 10 MG tablet Take 10 mg by mouth daily.      Marland Kitchen tiotropium (SPIRIVA) 18 MCG inhalation capsule Place 1 capsule (18 mcg total) into inhaler and inhale daily.  30 capsule  1  . traZODone (DESYREL) 50 MG tablet Take 50 mg by mouth at bedtime.       Scheduled: . acidophilus  1 capsule Oral Daily  . albuterol  2.5 mg Nebulization Q4H  . ALPRAZolam  0.5 mg Oral QID  . antiseptic oral rinse  15 mL Mouth Rinse QID  . chlorhexidine  15 mL Mouth Rinse BID  . enoxaparin (LOVENOX) injection  40 mg Subcutaneous Q24H  . influenza vac split quadrivalent PF  0.5 mL Intramuscular Tomorrow-1000  . insulin aspart  0-9 Units Subcutaneous Q6H  . ipratropium  0.5 mg Nebulization Q4H  . levofloxacin (LEVAQUIN) IV  500 mg Intravenous Q24H  . methylPREDNISolone (SOLU-MEDROL) injection  40 mg Intravenous Q6H  . montelukast  10 mg Oral QHS  . pantoprazole (PROTONIX) IV  40 mg Intravenous Daily  . PARoxetine  30 mg Oral [id number]  . pneumococcal 23 valent vaccine  0.5 mL Intramuscular Tomorrow-1000  . polyethylene glycol  17 g Oral Daily  . roflumilast  500 mcg Oral Daily  . sodium chloride  3 mL Intravenous Q12H  . traZODone  50 mg Oral QHS    Continuous: . sodium chloride 75 mL/hr at 01/30/13 0600   ZOX:[id number], acetaminophen, albuterol, fentaNYL, midazolam, ondansetron (ZOFRAN) IV, ondansetron  Assesment: She was admitted with acute respiratory failure requiring intubation and mechanical ventilation. She came off the ventilator fairly quickly. She has severe COPD and had acute exacerbation. She did not have definite pneumonia. She has diabetes which is reasonably well controlled at this time. She has severe problems with anxiety and depression/bipolar disease and that has complicated her situation Principal Problem:   Acute respiratory failure with hypercapnia Active Problems:   COPD with acute exacerbation   Chronic respiratory failure   Diabetes    Plan: Advance her diet. She can probably move from the unit later today.    LOS: 2 days   Lawonda Pretlow L 01/30/2013, 8:09 AM

## 2013-01-31 LAB — GLUCOSE, CAPILLARY
Glucose-Capillary: 136 mg/dL — ABNORMAL HIGH (ref 70–99)
Glucose-Capillary: 226 mg/dL — ABNORMAL HIGH (ref 70–99)
Glucose-Capillary: 174 mg/dL — ABNORMAL HIGH (ref 70–99)
Glucose-Capillary: 185 mg/dL — ABNORMAL HIGH (ref 70–99)

## 2013-01-31 MED ORDER — PANTOPRAZOLE SODIUM 40 MG PO TBEC
40.0000 mg | DELAYED_RELEASE_TABLET | Freq: Every day | ORAL | Status: DC
  Administered 2013-02-01: 40 mg via ORAL
  Filled 2013-01-31: qty 1

## 2013-01-31 MED ORDER — BIOTENE DRY MOUTH MT LIQD
15.0000 mL | Freq: Two times a day (BID) | OROMUCOSAL | Status: DC
  Administered 2013-02-01: 15 mL via OROMUCOSAL

## 2013-01-31 MED ORDER — LEVOFLOXACIN 500 MG PO TABS
500.0000 mg | ORAL_TABLET | Freq: Every day | ORAL | Status: DC
  Administered 2013-01-31 – 2013-02-01 (×2): 500 mg via ORAL
  Filled 2013-01-31 (×2): qty 1

## 2013-01-31 NOTE — Progress Notes (Signed)
Subjective: She says she feels okay. She has no new complaints.  Objective: Vital signs in last 24 hours: Temp:  [97.9 F (36.6 C)-98.5 F (36.9 C)] 98.1 F (36.7 C) (09/25 0400) Pulse Rate:  [74-101] 77 (09/25 0500) Resp:  [14-27] 19 (09/25 0500) BP: (88-126)/(60-82) 88/60 mmHg (09/25 0500) SpO2:  [91 %-97 %] 95 % (09/25 0756) Weight:  [70.6 kg (155 lb 10.3 oz)] 70.6 kg (155 lb 10.3 oz) (09/25 0500) Weight change:  Last BM Date: 01/30/13  Intake/Output from previous day: 09/24 0701 - 09/25 0700 In: 2810 [P.O.:1060; I.V.:1650; IV Piggyback:100] Out: 1650 [Urine:1650]  PHYSICAL EXAM General appearance: alert, cooperative and no distress Resp: clear to auscultation bilaterally Cardio: regular rate and rhythm, S1, S2 normal, no murmur, click, rub or gallop GI: soft, non-tender; bowel sounds normal; no masses,  no organomegaly Extremities: extremities normal, atraumatic, no cyanosis or edema  Lab Results:    Basic Metabolic Panel:  Recent Labs  45/40/98 1406 01/29/13 0441  NA 137 140  K 4.3 3.7  CL 95* 100  CO2 33* 27  GLUCOSE 205* 152*  BUN 9 11  CREATININE 0.71 0.78  CALCIUM 9.2 9.6   Liver Function Tests:  Recent Labs  01/28/13 1406  AST 12  ALT 7  ALKPHOS 96  BILITOT 0.4  PROT 7.5  ALBUMIN 3.7    Recent Labs  01/28/13 1406  LIPASE 14   No results found for this basename: AMMONIA,  in the last 72 hours CBC:  Recent Labs  01/28/13 1406 01/29/13 0441  WBC 14.4* 6.4  NEUTROABS 11.3*  --   HGB 14.3 14.4  HCT 46.2* 44.0  MCV 90.1 85.9  PLT 238 252   Cardiac Enzymes:  Recent Labs  01/28/13 1406  TROPONINI <0.30   BNP:  Recent Labs  01/28/13 1406  PROBNP 189.4*   D-Dimer: No results found for this basename: DDIMER,  in the last 72 hours CBG:  Recent Labs  01/30/13 0423 01/30/13 0728 01/30/13 1039 01/30/13 1636 01/30/13 2326 01/31/13 0417  GLUCAP 141* 120* 202* 146* 164* 136*   Hemoglobin A1C:  Recent Labs   01/29/13 0441  HGBA1C 6.0*   Fasting Lipid Panel: No results found for this basename: CHOL, HDL, LDLCALC, TRIG, CHOLHDL, LDLDIRECT,  in the last 72 hours Thyroid Function Tests: No results found for this basename: TSH, T4TOTAL, FREET4, T3FREE, THYROIDAB,  in the last 72 hours Anemia Panel: No results found for this basename: VITAMINB12, FOLATE, FERRITIN, TIBC, IRON, RETICCTPCT,  in the last 72 hours Coagulation:  Recent Labs  01/28/13 1406  LABPROT 13.7  INR 1.07   Urine Drug Screen: Drugs of Abuse     Component Value Date/Time   LABOPIA NONE DETECTED 07/30/2011 0452   COCAINSCRNUR NONE DETECTED 07/30/2011 0452   LABBENZ POSITIVE* 07/30/2011 0452   AMPHETMU NONE DETECTED 07/30/2011 0452   THCU NONE DETECTED 07/30/2011 0452   LABBARB NONE DETECTED 07/30/2011 0452    Alcohol Level: No results found for this basename: ETH,  in the last 72 hours Urinalysis:  Recent Labs  01/28/13 1406  COLORURINE YELLOW  LABSPEC 1.025  PHURINE 6.0  GLUCOSEU NEGATIVE  HGBUR SMALL*  BILIRUBINUR NEGATIVE  KETONESUR NEGATIVE  PROTEINUR 30*  UROBILINOGEN 0.2  NITRITE NEGATIVE  LEUKOCYTESUR NEGATIVE   Misc. Labs:  ABGS  Recent Labs  01/29/13 0500  PHART 7.502*  PO2ART 133.0*  TCO2 23.4  HCO3 27.3*   CULTURES Recent Results (from the past 240 hour(s))  MRSA PCR SCREENING  Status: None   Collection Time    01/28/13  5:40 PM      Result Value Range Status   MRSA by PCR NEGATIVE  NEGATIVE Final   Comment:            The GeneXpert MRSA Assay (FDA     approved for NASAL specimens     only), is one component of a     comprehensive MRSA colonization     surveillance program. It is not     intended to diagnose MRSA     infection nor to guide or     monitor treatment for     MRSA infections.   Studies/Results: No results found.  Medications:  Prior to Admission:  Prescriptions prior to admission  Medication Sig Dispense Refill  . acidophilus (RISAQUAD) CAPS Take 1 capsule  by mouth daily.  30 capsule  5  . albuterol (PROVENTIL HFA;VENTOLIN HFA) 108 (90 BASE) MCG/ACT inhaler Inhale 2 puffs into the lungs every 6 (six) hours as needed for wheezing or shortness of breath.      Marland Kitchen albuterol (PROVENTIL) (2.5 MG/3ML) 0.083% nebulizer solution Take 2.5 mg by nebulization 4 (four) times daily.      Marland Kitchen ALPRAZolam (XANAX) 0.5 MG tablet Take 0.5 mg by mouth 4 (four) times daily.      . Calcium Carbonate-Vitamin D (CALCIUM 600 + D PO) Take 1 tablet by mouth daily.      . cetirizine (ZYRTEC) 10 MG tablet Take 10 mg by mouth daily as needed for allergies.      . cyclobenzaprine (FLEXERIL) 5 MG tablet Take 5 mg by mouth 2 (two) times daily as needed for muscle spasms.       Marland Kitchen dexlansoprazole (DEXILANT) 60 MG capsule Take 60 mg by mouth daily.      . Fluticasone-Salmeterol (ADVAIR) 250-50 MCG/DOSE AEPB Inhale 1 puff into the lungs every 12 (twelve) hours.  60 each  1  . gabapentin (NEURONTIN) 300 MG capsule Take 300 mg by mouth 2 (two) times daily.      . montelukast (SINGULAIR) 10 MG tablet Take 10 mg by mouth at bedtime.      Marland Kitchen oxyCODONE-acetaminophen (PERCOCET/ROXICET) 5-325 MG per tablet Take 0.5 tablets by mouth 2 (two) times daily as needed for pain.      Marland Kitchen PARoxetine (PAXIL) 30 MG tablet Take 1 tablet (30 mg total) by mouth every morning.  30 tablet  1  . polyethylene glycol powder (GLYCOLAX/MIRALAX) powder Take 17 g by mouth daily.  255 g  1  . roflumilast (DALIRESP) 500 MCG TABS tablet Take 500 mcg by mouth daily.      . rosuvastatin (CRESTOR) 10 MG tablet Take 10 mg by mouth daily.      Marland Kitchen tiotropium (SPIRIVA) 18 MCG inhalation capsule Place 1 capsule (18 mcg total) into inhaler and inhale daily.  30 capsule  1  . traZODone (DESYREL) 50 MG tablet Take 50 mg by mouth at bedtime.       Scheduled: . acidophilus  1 capsule Oral Daily  . albuterol  2.5 mg Nebulization Q4H  . ALPRAZolam  0.5 mg Oral QID  . antiseptic oral rinse  15 mL Mouth Rinse QID  . chlorhexidine  15 mL  Mouth Rinse BID  . enoxaparin (LOVENOX) injection  40 mg Subcutaneous Q24H  . insulin aspart  0-9 Units Subcutaneous Q6H  . ipratropium  0.5 mg Nebulization Q4H  . levofloxacin (LEVAQUIN) IV  500 mg Intravenous Q24H  .  methylPREDNISolone (SOLU-MEDROL) injection  40 mg Intravenous Q6H  . montelukast  10 mg Oral QHS  . pantoprazole (PROTONIX) IV  40 mg Intravenous Daily  . PARoxetine  30 mg Oral BH-q7a  . pneumococcal 23 valent vaccine  0.5 mL Intramuscular Tomorrow-1000  . polyethylene glycol  17 g Oral Daily  . roflumilast  500 mcg Oral Daily  . sodium chloride  3 mL Intravenous Q12H  . traZODone  50 mg Oral QHS   Continuous: . sodium chloride 75 mL/hr at 01/31/13 0500   ZOX:[id number], acetaminophen, albuterol, fentaNYL, midazolam, ondansetron (ZOFRAN) IV, ondansetron  Assesment: She was admitted with acute respiratory failure. She has chronic respiratory failure as well and she has COPD with exacerbation. She has diabetes and significant problems with anxiety and depression/bipolar. She is much improved. Principal Problem:   Acute respiratory failure with hypercapnia Active Problems:   COPD with acute exacerbation   Chronic respiratory failure   Diabetes    Plan: Transfer from the ICU today she may be able to be discharged tomorrow with extensive home services    LOS: 3 days   Kymere Fullington L 01/31/2013, 8:36 AM

## 2013-01-31 NOTE — Progress Notes (Signed)
PHARMACIST - PHYSICIAN COMMUNICATION DR:   Hawkins CONCERNING: Antibiotic IV to Oral Route Change Policy  RECOMMENDATION: This patient is receiving Levaquin by the intravenous route.  Based on criteria approved by the Pharmacy and Therapeutics Committee, the antibiotic(s) is/are being converted to the equivalent oral dose form(s).  DESCRIPTION: These criteria include:  Patient being treated for a respiratory tract infection, urinary tract infection, cellulitis or clostridium difficile associated diarrhea if on metronidazole  The patient is not neutropenic and does not exhibit a GI malabsorption state  The patient is eating (either orally or via tube) and/or has been taking other orally administered medications for a least 24 hours  The patient is improving clinically and has a Tmax < 100.5  If you have questions about this conversion, please contact the Pharmacy Department  [x]  ( [phone] )  [place] []  ( [phone]  [place]  []  ( [phone]  [facility] []  ( [phone]  [facility]   S. Jahira Swiss, PharmD  

## 2013-01-31 NOTE — Progress Notes (Signed)
Pt transferred to room 302 per MD order. Pt transferred via wheelchair with personal belongings. Report called to RN.

## 2013-01-31 NOTE — Progress Notes (Signed)
The patient is receiving Protonix by the intravenous route.  Based on criteria approved by the Pharmacy and Therapeutics Committee and the Medical Executive Committee, the medication is being converted to the equivalent oral dose form.  These criteria include: -No Active GI bleeding -Able to tolerate diet of full liquids (or better) or tube feeding OR able to tolerate other medications by the oral or enteral route  If you have any questions about this conversion, please contact the Pharmacy Department (ext [phone]).  Thank you.  Wayland Denis, [facility] 01/31/2013 2:37 PM

## 2013-02-01 LAB — GLUCOSE, CAPILLARY
Glucose-Capillary: 195 mg/dL — ABNORMAL HIGH (ref 70–99)
Glucose-Capillary: 299 mg/dL — ABNORMAL HIGH (ref 70–99)

## 2013-02-01 MED ORDER — METHYLPREDNISOLONE (PAK) 4 MG PO TABS: ORAL_TABLET | ORAL | Status: DC

## 2013-02-01 MED ORDER — CEPHALEXIN 500 MG PO CAPS: 500.0000 mg | ORAL_CAPSULE | Freq: Four times a day (QID) | ORAL | Status: DC

## 2013-02-01 NOTE — Progress Notes (Signed)
Subjective: She says she feels better and wants to go home. She has no new complaints. She is not coughing. She has been able to ambulate and is not any more short of breath than usual  Objective: Vital signs in last 24 hours: Temp:  [97.8 F (36.6 C)] 97.8 F (36.6 C) (09/26 0444) Pulse Rate:  [80-94] 80 (09/26 0444) Resp:  [16-24] 20 (09/26 0444) BP: (91-114)/(58-70) 106/68 mmHg (09/26 0444) SpO2:  [92 %-98 %] 92 % (09/26 0705) Weight:  [72.6 kg (160 lb 0.9 oz)] 72.6 kg (160 lb 0.9 oz) (09/26 0444) Weight change: 2 kg (4 lb 6.5 oz) Last BM Date: 01/30/13  Intake/Output from previous day: 09/25 0701 - 09/26 0700 In: 3100 [P.O.:1300; I.V.:1800] Out: 900 [Urine:900]  PHYSICAL EXAM General appearance: alert, cooperative and no distress Resp: clear to auscultation bilaterally Cardio: regular rate and rhythm, S1, S2 normal, no murmur, click, rub or gallop GI: soft, non-tender; bowel sounds normal; no masses,  no organomegaly Extremities: extremities normal, atraumatic, no cyanosis or edema  Lab Results:    Basic Metabolic Panel: No results found for this basename: NA, K, CL, CO2, GLUCOSE, BUN, CREATININE, CALCIUM, MG, PHOS,  in the last 72 hours Liver Function Tests: No results found for this basename: AST, ALT, ALKPHOS, BILITOT, PROT, ALBUMIN,  in the last 72 hours No results found for this basename: LIPASE, AMYLASE,  in the last 72 hours No results found for this basename: AMMONIA,  in the last 72 hours CBC: No results found for this basename: WBC, NEUTROABS, HGB, HCT, MCV, PLT,  in the last 72 hours Cardiac Enzymes: No results found for this basename: CKTOTAL, CKMB, CKMBINDEX, TROPONINI,  in the last 72 hours BNP: No results found for this basename: PROBNP,  in the last 72 hours D-Dimer: No results found for this basename: DDIMER,  in the last 72 hours CBG:  Recent Labs  01/30/13 2326 01/31/13 0417 01/31/13 1110 01/31/13 1635 01/31/13 2219 02/01/13 0551  GLUCAP  164* 136* 226* 174* 185* 195*   Hemoglobin A1C: No results found for this basename: HGBA1C,  in the last 72 hours Fasting Lipid Panel: No results found for this basename: CHOL, HDL, LDLCALC, TRIG, CHOLHDL, LDLDIRECT,  in the last 72 hours Thyroid Function Tests: No results found for this basename: TSH, T4TOTAL, FREET4, T3FREE, THYROIDAB,  in the last 72 hours Anemia Panel: No results found for this basename: VITAMINB12, FOLATE, FERRITIN, TIBC, IRON, RETICCTPCT,  in the last 72 hours Coagulation: No results found for this basename: LABPROT, INR,  in the last 72 hours Urine Drug Screen: Drugs of Abuse     Component Value Date/Time   LABOPIA NONE DETECTED 07/30/2011 0452   COCAINSCRNUR NONE DETECTED 07/30/2011 0452   LABBENZ POSITIVE* 07/30/2011 0452   AMPHETMU NONE DETECTED 07/30/2011 0452   THCU NONE DETECTED 07/30/2011 0452   LABBARB NONE DETECTED 07/30/2011 0452    Alcohol Level: No results found for this basename: ETH,  in the last 72 hours Urinalysis: No results found for this basename: COLORURINE, APPERANCEUR, LABSPEC, PHURINE, GLUCOSEU, HGBUR, BILIRUBINUR, KETONESUR, PROTEINUR, UROBILINOGEN, NITRITE, LEUKOCYTESUR,  in the last 72 hours Misc. Labs:  ABGS No results found for this basename: PHART, PCO2, PO2ART, TCO2, HCO3,  in the last 72 hours CULTURES Recent Results (from the past 240 hour(s))  MRSA PCR SCREENING     Status: None   Collection Time    01/28/13  5:40 PM      Result Value Range Status   MRSA by  PCR NEGATIVE  NEGATIVE Final   Comment:            The GeneXpert MRSA Assay (FDA     approved for NASAL specimens     only), is one component of a     comprehensive MRSA colonization     surveillance program. It is not     intended to diagnose MRSA     infection nor to guide or     monitor treatment for     MRSA infections.   Studies/Results: No results found.  Medications:  Prior to Admission:  Prescriptions prior to admission  Medication Sig Dispense Refill   . acidophilus (RISAQUAD) CAPS Take 1 capsule by mouth daily.  30 capsule  5  . albuterol (PROVENTIL HFA;VENTOLIN HFA) 108 (90 BASE) MCG/ACT inhaler Inhale 2 puffs into the lungs every 6 (six) hours as needed for wheezing or shortness of breath.      Marland Kitchen albuterol (PROVENTIL) (2.5 MG/3ML) 0.083% nebulizer solution Take 2.5 mg by nebulization 4 (four) times daily.      Marland Kitchen ALPRAZolam (XANAX) 0.5 MG tablet Take 0.5 mg by mouth 4 (four) times daily.      . Calcium Carbonate-Vitamin D (CALCIUM 600 + D PO) Take 1 tablet by mouth daily.      . cetirizine (ZYRTEC) 10 MG tablet Take 10 mg by mouth daily as needed for allergies.      . cyclobenzaprine (FLEXERIL) 5 MG tablet Take 5 mg by mouth 2 (two) times daily as needed for muscle spasms.       Marland Kitchen dexlansoprazole (DEXILANT) 60 MG capsule Take 60 mg by mouth daily.      . Fluticasone-Salmeterol (ADVAIR) 250-50 MCG/DOSE AEPB Inhale 1 puff into the lungs every 12 (twelve) hours.  60 each  1  . gabapentin (NEURONTIN) 300 MG capsule Take 300 mg by mouth 2 (two) times daily.      . montelukast (SINGULAIR) 10 MG tablet Take 10 mg by mouth at bedtime.      Marland Kitchen oxyCODONE-acetaminophen (PERCOCET/ROXICET) 5-325 MG per tablet Take 0.5 tablets by mouth 2 (two) times daily as needed for pain.      Marland Kitchen PARoxetine (PAXIL) 30 MG tablet Take 1 tablet (30 mg total) by mouth every morning.  30 tablet  1  . polyethylene glycol powder (GLYCOLAX/MIRALAX) powder Take 17 g by mouth daily.  255 g  1  . roflumilast (DALIRESP) 500 MCG TABS tablet Take 500 mcg by mouth daily.      . rosuvastatin (CRESTOR) 10 MG tablet Take 10 mg by mouth daily.      Marland Kitchen tiotropium (SPIRIVA) 18 MCG inhalation capsule Place 1 capsule (18 mcg total) into inhaler and inhale daily.  30 capsule  1  . traZODone (DESYREL) 50 MG tablet Take 50 mg by mouth at bedtime.       Scheduled: . acidophilus  1 capsule Oral Daily  . albuterol  2.5 mg Nebulization Q4H  . ALPRAZolam  0.5 mg Oral QID  . antiseptic oral rinse   15 mL Mouth Rinse BID  . enoxaparin (LOVENOX) injection  40 mg Subcutaneous Q24H  . insulin aspart  0-9 Units Subcutaneous Q6H  . ipratropium  0.5 mg Nebulization Q4H  . levofloxacin  500 mg Oral Daily  . methylPREDNISolone (SOLU-MEDROL) injection  40 mg Intravenous Q6H  . montelukast  10 mg Oral QHS  . pantoprazole  40 mg Oral Daily  . PARoxetine  30 mg Oral BH-q7a  . polyethylene glycol  17 g Oral Daily  . roflumilast  500 mcg Oral Daily  . sodium chloride  3 mL Intravenous Q12H  . traZODone  50 mg Oral QHS   Continuous: . sodium chloride 75 mL/hr at 02/01/13 0200   [id number], acetaminophen, albuterol, fentaNYL, midazolam, ondansetron (ZOFRAN) IV, ondansetron  Assesment: She was admitted with acute respiratory failure. She has severe COPD. She has diabetes. She has chronic low back pain and is on chronic narcotics for that. She has anxiety and depression/bipolar disease which is stable Principal Problem:   Acute respiratory failure with hypercapnia Active Problems:   COPD with acute exacerbation   Chronic respiratory failure   Diabetes    Plan: Discharge home today    LOS: 4 days   Lynnann Knudsen L 02/01/2013, 8:33 AM

## 2013-02-01 NOTE — Progress Notes (Signed)
UR chart review completed.  

## 2013-02-01 NOTE — Progress Notes (Addendum)
AVS reviewed with patient.  Pt and pt's caregiver verbalized understanding of d/c instructions.  Teach back method used.  [facility] to resume care of patient at discharge.  Metformin not listed on AVS.  Pt is diabetic and takes Metformin twice a day.  RN called Dr. Janna Arch, for Dr. Juanetta Gosling.  Dr. Janna Arch reviewed labs and gave order that patient resume home dose of Metformin.  Pt educated and aware of this.  [facility] brought portable O2 tank for patient for discharge.  Prescriptions returned to patient from the pharmacy.  Pt's IV removed.  Site WNL.  Pt transported by NT via w/c to main entrance for discharge.  Pt stable at time of discharge.

## 2013-02-01 NOTE — Discharge Summary (Signed)
Physician Discharge Summary  Patient ID: Kristin Wells MRN: [id number] DOB/AGE: 61-Dec-1952 61 y.o. Primary Care Physician:Artesia Berkey L, MD Admit date: 01/28/2013 Discharge date: 02/01/2013    Discharge Diagnoses:   Principal Problem:   Acute respiratory failure with hypercapnia Active Problems:   HTN (hypertension)   Anxiety disorder   Chronic back pain   CAD (coronary artery disease)   Systolic CHF, chronic   COPD with acute exacerbation   Chronic respiratory failure   Diabetes     Medication List         acidophilus Caps capsule  Take 1 capsule by mouth daily.     albuterol (2.5 MG/3ML) 0.083% nebulizer solution  Commonly known as:  PROVENTIL  Take 2.5 mg by nebulization 4 (four) times daily.     albuterol 108 (90 BASE) MCG/ACT inhaler  Commonly known as:  PROVENTIL HFA;VENTOLIN HFA  Inhale 2 puffs into the lungs every 6 (six) hours as needed for wheezing or shortness of breath.     ALPRAZolam 0.5 MG tablet  Commonly known as:  XANAX  Take 0.5 mg by mouth 4 (four) times daily.     CALCIUM 600 + D PO  Take 1 tablet by mouth daily.     cephALEXin 500 MG capsule  Commonly known as:  KEFLEX  Take 1 capsule (500 mg total) by mouth 4 (four) times daily.     cetirizine 10 MG tablet  Commonly known as:  ZYRTEC  Take 10 mg by mouth daily as needed for allergies.     cyclobenzaprine 5 MG tablet  Commonly known as:  FLEXERIL  Take 5 mg by mouth 2 (two) times daily as needed for muscle spasms.     DEXILANT 60 MG capsule  Generic drug:  dexlansoprazole  Take 60 mg by mouth daily.     Fluticasone-Salmeterol 250-50 MCG/DOSE Aepb  Commonly known as:  ADVAIR  Inhale 1 puff into the lungs every 12 (twelve) hours.     gabapentin 300 MG capsule  Commonly known as:  NEURONTIN  Take 300 mg by mouth 2 (two) times daily.     methylPREDNIsolone 4 MG tablet  Commonly known as:  MEDROL DOSPACK  follow package directions     montelukast 10 MG tablet  Commonly  known as:  SINGULAIR  Take 10 mg by mouth at bedtime.     oxyCODONE-acetaminophen 5-325 MG per tablet  Commonly known as:  PERCOCET/ROXICET  Take 0.5 tablets by mouth 2 (two) times daily as needed for pain.     PARoxetine 30 MG tablet  Commonly known as:  PAXIL  Take 1 tablet (30 mg total) by mouth every morning.     polyethylene glycol powder powder  Commonly known as:  GLYCOLAX/MIRALAX  Take 17 g by mouth daily.     roflumilast 500 MCG Tabs tablet  Commonly known as:  DALIRESP  Take 500 mcg by mouth daily.     rosuvastatin 10 MG tablet  Commonly known as:  CRESTOR  Take 10 mg by mouth daily.     tiotropium 18 MCG inhalation capsule  Commonly known as:  SPIRIVA  Place 1 capsule (18 mcg total) into inhaler and inhale daily.     traZODone 50 MG tablet  Commonly known as:  DESYREL  Take 50 mg by mouth at bedtime.        Discharged Condition: She came to the emergency department with acute shortness of breath. She was found to be in respiratory distress and intubated in the  emergency department. She was placed on mechanical ventilation begun on IV steroids antibiotics inhaled bronchodilators and improved rapidly. She was able to be extubated the next morning. She was continued on antibiotics and steroids and continued improvement. She was able to ambulate in the halls her shortness of breath resolved to baseline. She was treated with insulin for her diabetes but she has not required that at home in the past. She was strongly admonished not to smoke.    Consults: None  Significant Diagnostic Studies: Portable Chest Xray In Am  01/29/2013   CLINICAL DATA:  COPD exacerbation, respiratory failure  EXAM: PORTABLE CHEST - 1 VIEW  COMPARISON:  01/28/2013  FINDINGS: Endotracheal tube terminates 4 cm above the carina.  Chronic interstitial markings/emphysematous changes. No superimposed opacities suspicious for pneumonia. No pleural effusion or pneumothorax.  The heart is normal in  size.  Enteric tube coursing below the diaphragm.  IMPRESSION: Endotracheal tube terminates 4 cm above the carina.   Electronically Signed   By: Kristin Wells M.D.   On: 01/29/2013 07:59   Dg Chest Portable 1 View  01/28/2013   CLINICAL DATA:  Patient intubated. Respiratory distress.  EXAM: PORTABLE CHEST - 1 VIEW  COMPARISON:  12/05/2012  FINDINGS: The heart size is normal. There are perihilar peribronchial changes. No focal consolidations or pleural effusions. Endotracheal tube is in place but the tip is difficult to visualize. Endotracheal tube is best localized to the trachea at the level of 3.9 cm above carina.  IMPRESSION: 1. Endotracheal tube in place, tip difficult to visualize. See above. 2. Mild perihilar peribronchial changes.   Electronically Signed   By: Rosalie Gums M.D.   On: 01/28/2013 13:39    Lab Results: Basic Metabolic Panel: No results found for this basename: NA, K, CL, CO2, GLUCOSE, BUN, CREATININE, CALCIUM, MG, PHOS,  in the last 72 hours Liver Function Tests: No results found for this basename: AST, ALT, ALKPHOS, BILITOT, PROT, ALBUMIN,  in the last 72 hours   CBC: No results found for this basename: WBC, NEUTROABS, HGB, HCT, MCV, PLT,  in the last 72 hours  Recent Results (from the past 240 hour(s))  MRSA PCR SCREENING     Status: None   Collection Time    01/28/13  5:40 PM      Result Value Range Status   MRSA by PCR NEGATIVE  NEGATIVE Final   Comment:            The GeneXpert MRSA Assay (FDA     approved for NASAL specimens     only), is one component of a     comprehensive MRSA colonization     surveillance program. It is not     intended to diagnose MRSA     infection nor to guide or     monitor treatment for     MRSA infections.     Hospital Course: She was treated with mechanical ventilation. She was treated with IV antibiotics and steroids and improved. She was able to be extubated fairly quickly. She had some trouble with anxiety depression and  this was treated. Her blood sugar was treated with insulin which he has not required home. She was able to ambulate without much difficulty. Her breathing breathing returned to baseline.  Discharge Exam: Blood pressure 106/68, pulse 80, temperature 97.8 F (36.6 C), temperature source Oral, resp. rate 20, height 5\' 5"  (1.651 m), weight 72.6 kg (160 lb 0.9 oz), SpO2 92.00%. She is awake and alert. Her  chest is clear. Her heart is regular.  Disposition: Home she has home health services she is being followed by [facility] for further help and she has a home health aide      Discharge Orders   Future Orders Complete By Expires   Discharge patient  As directed    Face-to-face encounter (required for Medicare/Medicaid patients)  As directed    Comments:     I Finnegan Gatta L certify that this patient is under my care and that I, or a nurse practitioner or physician's assistant working with me, had a face-to-face encounter that meets the physician face-to-face encounter requirements with this patient on 02/01/2013. The encounter with the patient was in whole, or in part for the following medical condition(s) which is the primary reason for home health care (List medical condition): respiratory therapy   Questions:     The encounter with the patient was in whole, or in part, for the following medical condition, which is the primary reason for home health care:  respiratory failure   I certify that, based on my findings, the following services are medically necessary home health services:  Nursing   My clinical findings support the need for the above services:  Shortness of breath with activity   Further, I certify that my clinical findings support that this patient is homebound due to:  Shortness of Breath with activity   Reason for Medically Necessary Home Health Services:  Skilled Nursing- Change/Decline in Patient Status   Home Health  As directed    Questions:     To provide the following  care/treatments:  RN        Signed: Amiera Herzberg Elbert Ewings Pager [phone]  02/01/2013, 8:48 AM

## 2013-02-26 ENCOUNTER — Ambulatory Visit (INDEPENDENT_AMBULATORY_CARE_PROVIDER_SITE_OTHER): Payer: 59 | Admitting: Adult Health

## 2013-02-26 ENCOUNTER — Encounter: Payer: Self-pay | Admitting: Adult Health

## 2013-02-26 VITALS — BP 92/66 | HR 104 | Ht 62.0 in | Wt 150.0 lb

## 2013-02-26 DIAGNOSIS — F17200 Nicotine dependence, unspecified, uncomplicated: Secondary | ICD-10-CM

## 2013-02-26 DIAGNOSIS — Z720 Tobacco use: Secondary | ICD-10-CM

## 2013-02-26 DIAGNOSIS — I2510 Atherosclerotic heart disease of native coronary artery without angina pectoris: Secondary | ICD-10-CM

## 2013-02-26 DIAGNOSIS — I5022 Chronic systolic (congestive) heart failure: Secondary | ICD-10-CM

## 2013-02-26 DIAGNOSIS — I10 Essential (primary) hypertension: Secondary | ICD-10-CM

## 2013-02-26 DIAGNOSIS — I509 Heart failure, unspecified: Secondary | ICD-10-CM

## 2013-02-26 MED ORDER — FUROSEMIDE 20 MG PO TABS: 20.0000 mg | ORAL_TABLET | Freq: Every day | ORAL | Status: DC

## 2013-02-26 MED ORDER — ROSUVASTATIN CALCIUM 10 MG PO TABS: 10.0000 mg | ORAL_TABLET | Freq: Every day | ORAL | Status: DC

## 2013-02-26 NOTE — Assessment & Plan Note (Signed)
She continues to smoke one to 2 cigarettes a day sometimes more depending upon her anxiety level. I have advised her to do her best to quit altogether. She verbalizes understanding.

## 2013-02-26 NOTE — Patient Instructions (Signed)
Your physician recommends that you schedule a follow-up appointment in: 6 months with Dr. Ross.  Your physician recommends that you continue on your current medications as directed. Please refer to the Current Medication list given to you today.  

## 2013-02-26 NOTE — Assessment & Plan Note (Signed)
She appears stable from a cardiovascular standpoint. She denies any chest pain. She continues to have chronic dyspnea on exertion. I believe this to be related more to COPD vs. her CAD. She will continue risk management. Smoking cessation is again encouraged.

## 2013-02-26 NOTE — Progress Notes (Signed)
HPI: [name] is a 62 year old patient of Dr. Tenny Craw we are following for ongoing assessment and management of chronic systolic heart failure with history of ischemic cardiomyopathy and CAD. The patient also has a history of COPD. He was last seen in the office in April of 2014 and was without complaints with exception of some mild abdominal distention related to chronic bowel issues. The patient was well compensated.    Sign was admitted on 01/28/2013 4 complaints of shortness of breath with COPD exacerbation, and acute respiratory failure with hypercapnia requiring intubation. She has recovered well, is followed by [facility] and at home, has had no further complaints. She continues to have bouts of anxiety. She also continues to smoke on occasion. She is medically compliant.          No Known Allergies  Current Outpatient Prescriptions  Medication Sig Dispense Refill  . acidophilus (RISAQUAD) CAPS Take 1 capsule by mouth daily.  30 capsule  5  . albuterol (PROVENTIL HFA;VENTOLIN HFA) 108 (90 BASE) MCG/ACT inhaler Inhale 2 puffs into the lungs every 6 (six) hours as needed for wheezing or shortness of breath.      Marland Kitchen albuterol (PROVENTIL) (2.5 MG/3ML) 0.083% nebulizer solution Take 2.5 mg by nebulization 4 (four) times daily.      Marland Kitchen ALPRAZolam (XANAX) 0.5 MG tablet Take 0.5 mg by mouth 4 (four) times daily.      . Calcium Carbonate-Vitamin D (CALCIUM 600 + D PO) Take 1 tablet by mouth daily.      . cephALEXin (KEFLEX) 500 MG capsule Take 1 capsule (500 mg total) by mouth 4 (four) times daily.  30 capsule  0  . cetirizine (ZYRTEC) 10 MG tablet Take 10 mg by mouth daily as needed for allergies.      . cyclobenzaprine (FLEXERIL) 5 MG tablet Take 5 mg by mouth 2 (two) times daily as needed for muscle spasms.       . Fluticasone-Salmeterol (ADVAIR) 250-50 MCG/DOSE AEPB Inhale 1 puff into the lungs every 12 (twelve) hours.  60 each  1  . furosemide (LASIX) 20 MG tablet       . gabapentin (NEURONTIN) 300  MG capsule Take 300 mg by mouth 2 (two) times daily.      . metFORMIN (GLUCOPHAGE) 500 MG tablet       . methylPREDNIsolone (MEDROL DOSPACK) 4 MG tablet follow package directions  21 tablet  0  . montelukast (SINGULAIR) 10 MG tablet Take 10 mg by mouth at bedtime.      Marland Kitchen omeprazole (PRILOSEC) 20 MG capsule       . oxyCODONE-acetaminophen (PERCOCET/ROXICET) 5-325 MG per tablet Take 0.5 tablets by mouth 2 (two) times daily as needed for pain.      Marland Kitchen PARoxetine (PAXIL) 30 MG tablet Take 1 tablet (30 mg total) by mouth every morning.  30 tablet  1  . polyethylene glycol powder (GLYCOLAX/MIRALAX) powder Take 17 g by mouth daily.  255 g  1  . rosuvastatin (CRESTOR) 10 MG tablet Take 10 mg by mouth daily.      Marland Kitchen tiotropium (SPIRIVA) 18 MCG inhalation capsule Place 1 capsule (18 mcg total) into inhaler and inhale daily.  30 capsule  1  . traZODone (DESYREL) 50 MG tablet Take 50 mg by mouth at bedtime.       No current facility-administered medications for this visit.    Past Medical History  Diagnosis Date  . COPD (chronic obstructive pulmonary disease)   . Asthma   .  Bronchitis   . Anxiety   . Back pain   . Demand ischemia of myocardium 06/28/2011  . Thyroid enlargement 06/28/2011  . MI, acute, non ST segment elevation 06/29/2011    Myoview stress test revealed mild perfusion defect  . Ischemic cardiomyopathy 06/28/2011    EF 25%. Myoview stress test later showed ejection fraction of 65%  . PNA (pneumonia) 06/29/2011  . Acute respiratory failure 06/2011    Vent dependent sec to COPD/PNA  . Hypertension   . Coronary artery disease   . Myocardial infarction   . Pneumonia   . Diabetes mellitus without complication   . Arthritis   . Rectocele 10/09/2012    Past Surgical History  Procedure Laterality Date  . Esophagogastroduodenoscopy  Feb 2013    Dr. Bosie Clos: normal duodenum, candida esophagitis, reactive gastropathy  . Colonoscopy  Feb 2013    Dr. Bosie Clos: internal hemorrhoids, normal  view of ileum, multiple hyperplastic polyps  . Tubal ligation      [id number] of systems complete and found to be negative unless listed above  PHYSICAL EXAM BP 92/66  Pulse 104  Ht 5\' 2"  (1.575 m)  Wt 150 lb (68.04 kg)  BMI 27.43 kg/m2  SpO2 95%  General: Well developed, well nourished, in no acute distress Head: Eyes PERRLA, No xanthomas.   Normal cephalic and atramatic  Lungs: Clear bilaterally with bibasilar crackles. Heart: HRRR S1 S2, tachycardic, without MRG.  Pulses are 2+ & equal.            No carotid bruit. No JVD.  No abdominal bruits. No femoral bruits. Abdomen: Bowel sounds are positive, abdomen soft and non-tender without masses or                  Hernia's noted. Msk:  Back normal, normal gait. Normal strength and tone for age. Extremities: No clubbing, cyanosis or edema.  DP +1 Neuro: Alert and oriented X 3. Psych:  Good affect, responds appropriately    ASSESSMENT AND PLAN

## 2013-02-26 NOTE — Assessment & Plan Note (Signed)
No and the fluid overload at this time. She continues on Lasix 20 mg daily. She continues to eat salty foods, I have given her a written instruction on low sodium diet. Majority of what she is eating is related to her low income and what she can afford.

## 2013-02-26 NOTE — Progress Notes (Deleted)
Name: Kristin Wells    DOB: May 17, 1950  Age: 62 y.o.  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 Congestive Heart Failure    Systolic  . Coronary Artery Disease    VS Filed Vitals:   02/26/13 1401  BP: 92/66  Pulse: 104  Height: 5\' 2"  (1.575 m)  Weight: 150 lb (68.04 kg)  SpO2: 95%    Weights Current Weight  02/26/13 150 lb (68.04 kg)  02/01/13 160 lb 0.9 oz (72.6 kg)  11/15/12 140 lb (63.504 kg)    Blood Pressure  BP Readings from Last 3 Encounters:  02/26/13 92/66  02/01/13 106/68  12/05/12 104/53     Admit date:  (Not on file) Last encounter with RMR:  Visit date not found   Allergy Review of patient's allergies indicates no known allergies.  Current Outpatient Prescriptions  Medication Sig Dispense Refill  . acidophilus (RISAQUAD) CAPS Take 1 capsule by mouth daily.  30 capsule  5  . albuterol (PROVENTIL HFA;VENTOLIN HFA) 108 (90 BASE) MCG/ACT inhaler Inhale 2 puffs into the lungs every 6 (six) hours as needed for wheezing or shortness of breath.      Marland Kitchen albuterol (PROVENTIL) (2.5 MG/3ML) 0.083% nebulizer solution Take 2.5 mg by nebulization 4 (four) times daily.      Marland Kitchen ALPRAZolam (XANAX) 0.5 MG tablet Take 0.5 mg by mouth 4 (four) times daily.      . Calcium Carbonate-Vitamin D (CALCIUM 600 + D PO) Take 1 tablet by mouth daily.      . cephALEXin (KEFLEX) 500 MG capsule Take 1 capsule (500 mg total) by mouth 4 (four) times daily.  30 capsule  0  . cetirizine (ZYRTEC) 10 MG tablet Take 10 mg by mouth daily as needed for allergies.      . cyclobenzaprine (FLEXERIL) 5 MG tablet Take 5 mg by mouth 2 (two) times daily as needed for muscle spasms.       . Fluticasone-Salmeterol (ADVAIR) 250-50 MCG/DOSE AEPB Inhale 1 puff into the lungs every 12 (twelve) hours.  60 each  1  . furosemide (LASIX) 20 MG tablet        . gabapentin (NEURONTIN) 300 MG capsule Take 300 mg by mouth 2 (two) times daily.      . metFORMIN (GLUCOPHAGE) 500 MG tablet       . methylPREDNIsolone (MEDROL DOSPACK) 4 MG tablet follow package directions  21 tablet  0  . montelukast (SINGULAIR) 10 MG tablet Take 10 mg by mouth at bedtime.      Marland Kitchen omeprazole (PRILOSEC) 20 MG capsule       . oxyCODONE-acetaminophen (PERCOCET/ROXICET) 5-325 MG per tablet Take 0.5 tablets by mouth 2 (two) times daily as needed for pain.      Marland Kitchen PARoxetine (PAXIL) 30 MG tablet Take 1 tablet (30 mg total) by mouth every morning.  30 tablet  1  . polyethylene glycol powder (GLYCOLAX/MIRALAX) powder Take 17 g by mouth daily.  255 g  1  . rosuvastatin (CRESTOR) 10 MG tablet Take 10 mg by mouth daily.      Marland Kitchen tiotropium (SPIRIVA) 18 MCG inhalation capsule Place 1 capsule (18 mcg total) into inhaler and inhale daily.  30 capsule  1  . traZODone (DESYREL) 50 MG tablet Take 50  mg by mouth at bedtime.       No current facility-administered medications for this visit.    Discontinued Meds:    Medications Discontinued During This Encounter  Medication Reason  . dexlansoprazole (DEXILANT) 60 MG capsule   . roflumilast (DALIRESP) 500 MCG TABS tablet     Patient Active Problem List   Diagnosis Date Noted  . Acute respiratory failure with hypercapnia 01/28/2013  . Rectocele 10/09/2012  . Diabetes 10/05/2012  . COPD with acute exacerbation 10/04/2012  . Chronic respiratory failure 10/04/2012  . CAD (coronary artery disease) 02/21/2012  . Systolic CHF, chronic 02/21/2012  . COPD exacerbation 02/20/2012  . Respiratory failure, acute-on-chronic 02/20/2012  . Chronic back pain 02/20/2012  . Hypercapnia 07/30/2011  . Lethargy 07/30/2011  . COPD (chronic obstructive pulmonary disease) with acute bronchitis 07/11/2011  . HTN (hypertension) 07/11/2011  . Tobacco abuse 07/11/2011  . Anxiety disorder 07/11/2011  . MI, acute, non ST segment elevation 06/29/2011  .  Demand ischemia of myocardium 06/28/2011  . Hyperglycemia 06/28/2011  . Thyroid enlargement 06/28/2011  . Ischemic cardiomyopathy 06/28/2011  . Acute respiratory failure 06/27/2011  . HYPOKALEMIA 02/04/2010  . ANXIETY 02/04/2010  . SMOKER 02/04/2010  . DEPRESSION 02/04/2010  . Other chronic pain 02/04/2010  . COPD 02/04/2010    LABS    Component Value Date/Time   NA 140 01/29/2013 0441   NA 137 01/28/2013 1406   NA 138 12/05/2012 1626   K 3.7 01/29/2013 0441   K 4.3 01/28/2013 1406   K 3.7 12/05/2012 1626   CL 100 01/29/2013 0441   CL 95* 01/28/2013 1406   CL 98 12/05/2012 1626   CO2 27 01/29/2013 0441   CO2 33* 01/28/2013 1406   CO2 31 12/05/2012 1626   GLUCOSE 152* 01/29/2013 0441   GLUCOSE 205* 01/28/2013 1406   GLUCOSE 90 12/05/2012 1626   BUN 11 01/29/2013 0441   BUN 9 01/28/2013 1406   BUN 10 12/05/2012 1626   CREATININE 0.78 01/29/2013 0441   CREATININE 0.71 01/28/2013 1406   CREATININE 0.78 12/05/2012 1626   CALCIUM 9.6 01/29/2013 0441   CALCIUM 9.2 01/28/2013 1406   CALCIUM 9.6 12/05/2012 1626   GFRNONAA 88* 01/29/2013 0441   GFRNONAA >90 01/28/2013 1406   GFRNONAA 89* 12/05/2012 1626   GFRAA >90 01/29/2013 0441   GFRAA >90 01/28/2013 1406   GFRAA >90 12/05/2012 1626   CMP     Component Value Date/Time   NA 140 01/29/2013 0441   K 3.7 01/29/2013 0441   CL 100 01/29/2013 0441   CO2 27 01/29/2013 0441   GLUCOSE 152* 01/29/2013 0441   BUN 11 01/29/2013 0441   CREATININE 0.78 01/29/2013 0441   CALCIUM 9.6 01/29/2013 0441   PROT 7.5 01/28/2013 1406   ALBUMIN 3.7 01/28/2013 1406   AST 12 01/28/2013 1406   ALT 7 01/28/2013 1406   ALKPHOS 96 01/28/2013 1406   BILITOT 0.4 01/28/2013 1406   GFRNONAA 88* 01/29/2013 0441   GFRAA >90 01/29/2013 0441       Component Value Date/Time   WBC 6.4 01/29/2013 0441   WBC 14.4* 01/28/2013 1406   WBC 9.7 12/05/2012 1626   HGB 14.4 01/29/2013 0441   HGB 14.3 01/28/2013 1406   HGB 14.7 12/05/2012 1626   HCT 44.0 01/29/2013 0441   HCT 46.2* 01/28/2013 1406    HCT 44.8 12/05/2012 1626   MCV 85.9 01/29/2013 0441   MCV 90.1 01/28/2013 1406   MCV 89.4 12/05/2012 1626  Lipid Panel     Component Value Date/Time   CHOL 206* 07/01/2011 0534   TRIG 134 07/01/2011 0534   HDL 34* 07/01/2011 0534   CHOLHDL 6.1 07/01/2011 0534   VLDL 27 07/01/2011 0534   LDLCALC 145* 07/01/2011 0534    ABG    Component Value Date/Time   PHART 7.502* 01/29/2013 0500   PCO2ART 35.2 01/29/2013 0500   PO2ART 133.0* 01/29/2013 0500   HCO3 27.3* 01/29/2013 0500   TCO2 23.4 01/29/2013 0500   ACIDBASEDEF 2.8* 01/28/2013 1337   O2SAT 99.3 01/29/2013 0500     Lab Results  Component Value Date   TSH 3.199 10/04/2012   BNP (last 3 results)  Recent Labs  07/30/12 1648 10/04/12 0607 01/28/13 1406  PROBNP 4358.0* 94.1 189.4*   Cardiac Panel (last 3 results) No results found for this basename: CKTOTAL, CKMB, TROPONINI, RELINDX,  in the last 72 hours  Iron/TIBC/Ferritin No results found for this basename: iron, tibc, ferritin     EKG Orders placed during the hospital encounter of 01/28/13  . EKG 12-LEAD  . EKG 12-LEAD  . EKG 12-LEAD  . EKG 12-LEAD  . EKG     Prior Assessment and Plan Problem List as of 02/26/2013     Cardiovascular and Mediastinum   CAD (coronary artery disease)   Last Assessment & Plan   08/13/2012 Office Visit Written 08/13/2012  1:49 PM by Jodelle Gross, NP     No cardiac complaints at this time. The patient will be continued on risk factor management. Will see her again in 6 months unless symptomatic.    Systolic CHF, chronic   Last Assessment & Plan   08/13/2012 Office Visit Written 08/13/2012  1:48 PM by Jodelle Gross, NP     He appears well compensated at this time. Lung sounds have some crackles, related to COPD. She denies significant dyspnea on exertion I not finding any evidence of fluid retention. Abdomen remains obese.    Demand ischemia of myocardium   Ischemic cardiomyopathy   Last Assessment & Plan   08/13/2012 Office Visit  Written 08/13/2012  1:49 PM by Jodelle Gross, NP     We will repeat echocardiogram prior to next visit. She knows to call us sooner if she is symptomatic otherwise she will continue on current medication regimen.    MI, acute, non ST segment elevation   Last Assessment & Plan   08/18/2011 Office Visit Written 08/18/2011 12:15 PM by Jodelle Gross, NP     This was found to be related to demand ischemia in the setting of respiratory failure. She had a normal stress test during admission. Echo was abnormal EF of 25%-30& during acute phase of admission. Will continue her on current medications. No BB in the setting of COPD. Will repeat echo in 6 months for ongoing evaluation of EF.     HTN (hypertension)   Last Assessment & Plan   08/18/2011 Office Visit Written 08/18/2011 12:16 PM by Jodelle Gross, NP     Blood pressure is well controlled at present. She is asymptomatic for dizziness or pre-syncope.  No changes.      Respiratory   COPD   Acute respiratory failure   COPD (chronic obstructive pulmonary disease) with acute bronchitis   COPD exacerbation   Respiratory failure, acute-on-chronic   COPD with acute exacerbation   Chronic respiratory failure   Acute respiratory failure with hypercapnia     Digestive   Rectocele  Endocrine   Thyroid enlargement   Diabetes     Other   Chronic back pain   HYPOKALEMIA   ANXIETY   SMOKER   Last Assessment & Plan   08/18/2011 Office Visit Written 08/18/2011 12:17 PM by Jodelle Gross, NP     I have spoken with her about immediate cessation of smoking. She is actively trying to quit. I have advised her that her life-expectancy is shorted significantly if she continues to smoke with history of COPD. She verbalizes understanding.    DEPRESSION   Other chronic pain   Hyperglycemia   Tobacco abuse   Last Assessment & Plan   08/13/2012 Office Visit Written 08/13/2012  1:50 PM by Jodelle Gross, NP     She denies any further use of  tobacco.    Anxiety disorder   Hypercapnia   Lethargy       Imaging: Portable Chest Xray In Am  01/29/2013   CLINICAL DATA:  COPD exacerbation, respiratory failure  EXAM: PORTABLE CHEST - 1 VIEW  COMPARISON:  01/28/2013  FINDINGS: Endotracheal tube terminates 4 cm above the carina.  Chronic interstitial markings/emphysematous changes. No superimposed opacities suspicious for pneumonia. No pleural effusion or pneumothorax.  The heart is normal in size.  Enteric tube coursing below the diaphragm.  IMPRESSION: Endotracheal tube terminates 4 cm above the carina.   Electronically Signed   By: Charline Bills M.D.   On: 01/29/2013 07:59   Dg Chest Portable 1 View  01/28/2013   CLINICAL DATA:  Patient intubated. Respiratory distress.  EXAM: PORTABLE CHEST - 1 VIEW  COMPARISON:  12/05/2012  FINDINGS: The heart size is normal. There are perihilar peribronchial changes. No focal consolidations or pleural effusions. Endotracheal tube is in place but the tip is difficult to visualize. Endotracheal tube is best localized to the trachea at the level of 3.9 cm above carina.  IMPRESSION: 1. Endotracheal tube in place, tip difficult to visualize. See above. 2. Mild perihilar peribronchial changes.   Electronically Signed   By: Rosalie Gums M.D.   On: 01/28/2013 13:39

## 2013-02-26 NOTE — Assessment & Plan Note (Signed)
Blood pressure is low on this visit. She did not have any complaints of dizziness. She remains on Lasix, but is also on multiple pain meds. May need to remove Lasix from her medication regimen should she become dizzy or dehydrated.

## 2013-04-30 ENCOUNTER — Other Ambulatory Visit (HOSPITAL_COMMUNITY): Payer: Self-pay | Admitting: Pulmonary Disease

## 2013-04-30 DIAGNOSIS — Z139 Encounter for screening, unspecified: Secondary | ICD-10-CM

## 2013-05-10 ENCOUNTER — Ambulatory Visit (HOSPITAL_COMMUNITY)
Admission: RE | Admit: 2013-05-10 | Discharge: 2013-05-10 | Disposition: A | Discharge status: Home / Self Care | Payer: PRIVATE HEALTH INSURANCE | Source: Ambulatory Visit | Attending: Pulmonary Disease | Admitting: Pulmonary Disease

## 2013-05-10 DIAGNOSIS — Z1231 Encounter for screening mammogram for malignant neoplasm of breast: Secondary | ICD-10-CM | POA: Insufficient documentation

## 2013-05-10 DIAGNOSIS — Z139 Encounter for screening, unspecified: Secondary | ICD-10-CM

## 2013-06-19 ENCOUNTER — Emergency Department (HOSPITAL_COMMUNITY)
Admission: EM | Admit: 2013-06-19 | Discharge: 2013-06-19 | Discharge status: Against Medical Advice | Payer: PRIVATE HEALTH INSURANCE

## 2013-06-19 LAB — TSH: TSH: 4.65 (ref <=5.90)

## 2013-08-30 ENCOUNTER — Emergency Department (HOSPITAL_COMMUNITY)
Admission: EM | Admit: 2013-08-30 | Discharge: 2013-08-30 | Disposition: A | Discharge status: Home / Self Care | Payer: PRIVATE HEALTH INSURANCE | Attending: Emergency Medicine | Admitting: Emergency Medicine

## 2013-08-30 ENCOUNTER — Encounter (HOSPITAL_COMMUNITY): Payer: Self-pay | Admitting: Emergency Medicine

## 2013-08-30 DIAGNOSIS — N949 Unspecified condition associated with female genital organs and menstrual cycle: Secondary | ICD-10-CM | POA: Insufficient documentation

## 2013-08-30 DIAGNOSIS — Z8701 Personal history of pneumonia (recurrent): Secondary | ICD-10-CM | POA: Insufficient documentation

## 2013-08-30 DIAGNOSIS — I2510 Atherosclerotic heart disease of native coronary artery without angina pectoris: Secondary | ICD-10-CM | POA: Insufficient documentation

## 2013-08-30 DIAGNOSIS — J449 Chronic obstructive pulmonary disease, unspecified: Secondary | ICD-10-CM | POA: Insufficient documentation

## 2013-08-30 DIAGNOSIS — R339 Retention of urine, unspecified: Secondary | ICD-10-CM | POA: Insufficient documentation

## 2013-08-30 DIAGNOSIS — I509 Heart failure, unspecified: Secondary | ICD-10-CM | POA: Insufficient documentation

## 2013-08-30 DIAGNOSIS — Z792 Long term (current) use of antibiotics: Secondary | ICD-10-CM | POA: Insufficient documentation

## 2013-08-30 DIAGNOSIS — F17200 Nicotine dependence, unspecified, uncomplicated: Secondary | ICD-10-CM | POA: Insufficient documentation

## 2013-08-30 DIAGNOSIS — R300 Dysuria: Secondary | ICD-10-CM | POA: Insufficient documentation

## 2013-08-30 DIAGNOSIS — R109 Unspecified abdominal pain: Secondary | ICD-10-CM | POA: Insufficient documentation

## 2013-08-30 DIAGNOSIS — F411 Generalized anxiety disorder: Secondary | ICD-10-CM | POA: Insufficient documentation

## 2013-08-30 DIAGNOSIS — I252 Old myocardial infarction: Secondary | ICD-10-CM | POA: Insufficient documentation

## 2013-08-30 DIAGNOSIS — M549 Dorsalgia, unspecified: Secondary | ICD-10-CM | POA: Insufficient documentation

## 2013-08-30 DIAGNOSIS — IMO0001 Reserved for inherently not codable concepts without codable children: Secondary | ICD-10-CM | POA: Insufficient documentation

## 2013-08-30 DIAGNOSIS — J4489 Other specified chronic obstructive pulmonary disease: Secondary | ICD-10-CM | POA: Insufficient documentation

## 2013-08-30 DIAGNOSIS — Z79899 Other long term (current) drug therapy: Secondary | ICD-10-CM | POA: Insufficient documentation

## 2013-08-30 DIAGNOSIS — N899 Noninflammatory disorder of vagina, unspecified: Secondary | ICD-10-CM | POA: Insufficient documentation

## 2013-08-30 DIAGNOSIS — Z8739 Personal history of other diseases of the musculoskeletal system and connective tissue: Secondary | ICD-10-CM | POA: Insufficient documentation

## 2013-08-30 DIAGNOSIS — R6883 Chills (without fever): Secondary | ICD-10-CM | POA: Insufficient documentation

## 2013-08-30 DIAGNOSIS — R3919 Other difficulties with micturition: Secondary | ICD-10-CM | POA: Insufficient documentation

## 2013-08-30 DIAGNOSIS — R110 Nausea: Secondary | ICD-10-CM | POA: Insufficient documentation

## 2013-08-30 DIAGNOSIS — E119 Type 2 diabetes mellitus without complications: Secondary | ICD-10-CM | POA: Insufficient documentation

## 2013-08-30 DIAGNOSIS — M791 Myalgia, unspecified site: Secondary | ICD-10-CM

## 2013-08-30 DIAGNOSIS — IMO0002 Reserved for concepts with insufficient information to code with codable children: Secondary | ICD-10-CM | POA: Insufficient documentation

## 2013-08-30 DIAGNOSIS — I10 Essential (primary) hypertension: Secondary | ICD-10-CM | POA: Insufficient documentation

## 2013-08-30 HISTORY — DX: Heart failure, unspecified: I50.9

## 2013-08-30 LAB — CBC WITH DIFFERENTIAL/PLATELET
Basophils Absolute: 0 10*3/uL (ref 0.0–0.1)
Basophils Relative: 0 % (ref 0–1)
Eosinophils Absolute: 0.1 10*3/uL (ref 0.0–0.7)
Eosinophils Relative: 2 % (ref 0–5)
HCT: 42.3 % (ref 36.0–46.0)
Hemoglobin: 14.2 g/dL (ref 12.0–15.0)
Lymphocytes Relative: 43 % (ref 12–46)
Lymphs Abs: 2.9 10*3/uL (ref 0.7–4.0)
MCH: 27.6 pg (ref 26.0–34.0)
MCHC: 33.6 g/dL (ref 30.0–36.0)
MCV: 82.1 fL (ref 78.0–100.0)
Monocytes Absolute: 0.4 10*3/uL (ref 0.1–1.0)
Monocytes Relative: 6 % (ref 3–12)
Neutro Abs: 3.3 10*3/uL (ref 1.7–7.7)
Neutrophils Relative %: 49 % (ref 43–77)
Platelets: 255 10*3/uL (ref 150–400)
RBC: 5.15 MIL/uL — ABNORMAL HIGH (ref 3.87–5.11)
RDW: 16 % — ABNORMAL HIGH (ref 11.5–15.5)
WBC: 6.6 10*3/uL (ref 4.0–10.5)

## 2013-08-30 LAB — BASIC METABOLIC PANEL
BUN: 10 mg/dL (ref 6–23)
CO2: 28 mEq/L (ref 19–32)
Calcium: 9.4 mg/dL (ref 8.4–10.5)
Chloride: 98 mEq/L (ref 96–112)
Creatinine, Ser: 0.85 mg/dL (ref 0.50–1.10)
GFR calc Af Amer: 84 mL/min — ABNORMAL LOW (ref >90)
GFR calc non Af Amer: 72 mL/min — ABNORMAL LOW (ref >90)
Glucose, Bld: 96 mg/dL (ref 70–99)
Potassium: 3.5 mEq/L — ABNORMAL LOW (ref 3.7–5.3)
Sodium: 139 mEq/L (ref 137–147)

## 2013-08-30 LAB — CBG MONITORING, ED: Glucose-Capillary: 101 mg/dL — ABNORMAL HIGH (ref 70–99)

## 2013-08-30 LAB — URINALYSIS, ROUTINE W REFLEX MICROSCOPIC
Bilirubin Urine: NEGATIVE
Glucose, UA: NEGATIVE mg/dL
Ketones, ur: NEGATIVE mg/dL
Leukocytes, UA: NEGATIVE
Nitrite: NEGATIVE
Protein, ur: NEGATIVE mg/dL
Specific Gravity, Urine: 1.01 (ref 1.005–1.030)
Urobilinogen, UA: 0.2 mg/dL (ref 0.0–1.0)
pH: 5.5 (ref 5.0–8.0)

## 2013-08-30 LAB — URINE MICROSCOPIC-ADD ON

## 2013-08-30 NOTE — ED Notes (Signed)
At doctor's request patient was given water to drink in order to aid [place] in obtaining a urine specimen.

## 2013-08-30 NOTE — ED Notes (Signed)
BG 101 mg/dL

## 2013-08-30 NOTE — ED Notes (Signed)
Patient c/o pain under left shoulder pain. Patient reports having bilateral mid back pain and pain with urination, but now states "it's numb." Per patient she is having urinary retention. Patient states "It just drips when I do go."

## 2013-08-30 NOTE — ED Provider Notes (Signed)
CSN: [id number]     Arrival date & time 08/30/13  1250 History  This chart was scribed for Richarda Blade, MD by Zettie Pho, ED Scribe. This patient was seen in room APA04/[facility]04 and the patient's care was started at 1:27 PM.    Chief Complaint  Patient presents with  . Urinary Retention  . Shoulder Pain   The history is provided by the patient. No language interpreter was used.   HPI Comments: Kristin Wells is a 63 y.o. female with a history of back pain and DM who presents to the Emergency Department complaining of an intermittent pain to the lower back that radiates to the lower abdomen with associated burning dysuria and decreased urine volume onset a few days ago. She reports some associated vaginal irritation and states that using toilet paper exacerbates the discomfort. She reports associated chills and decreased appetite that she states has been ongoing intermittently for the past 6 years. She denies fever, vomiting, constipation. Patient reports that her blood sugar has been running in the low-mid 200s. Patient also has a history of COPD, asthma, bronchitis, pneumonia, acute respiratory failure, anxiety, thyroid enlargement, MI, ischemic cardiomyopathy, HTN, coronary artery disease, and CHF.   Patient also has a history of arthritis. Patient is also complaining of a waxing and waning pain to the left shoulder that she states has been ongoing for the past year. She states that she has followed up in the past for this, but that the pain has been persistent.   Past Medical History  Diagnosis Date  . COPD (chronic obstructive pulmonary disease)   . Asthma   . Bronchitis   . Anxiety   . Back pain   . Demand ischemia of myocardium 06/28/2011  . Thyroid enlargement 06/28/2011  . MI, acute, non ST segment elevation 06/29/2011    Myoview stress test revealed mild perfusion defect  . Ischemic cardiomyopathy 06/28/2011    EF 25%. Myoview stress test later showed ejection fraction of 65%  .  PNA (pneumonia) 06/29/2011  . Acute respiratory failure 06/2011    Vent dependent sec to COPD/PNA  . Hypertension   . Coronary artery disease   . Myocardial infarction   . Pneumonia   . Diabetes mellitus without complication   . Arthritis   . Rectocele 10/09/2012  . CHF (congestive heart failure)    Past Surgical History  Procedure Laterality Date  . Esophagogastroduodenoscopy  Feb 2013    Dr. Michail Sermon: normal duodenum, candida esophagitis, reactive gastropathy  . Colonoscopy  Feb 2013    Dr. Michail Sermon: internal hemorrhoids, normal view of ileum, multiple hyperplastic polyps  . Tubal ligation     Family History  Problem Relation Age of Onset  . Diabetes Mother   . Hypertension Mother   . Diabetes Father   . Hypertension Father   . Colon cancer Neg Hx    History  Substance Use Topics  . Smoking status: Current Every Day Smoker -- 0.50 packs/day for 44 years    Types: Cigarettes  . Smokeless tobacco: Never Used  . Alcohol Use: No   OB History   Grav Para Term Preterm Abortions TAB SAB Ect Mult Living   8 7   1  1   6      Review of Systems  Constitutional: Positive for chills and appetite change (decreased). Negative for fever.  Gastrointestinal: Positive for nausea and abdominal pain. Negative for vomiting and constipation.  Genitourinary: Positive for dysuria, decreased urine volume, difficulty urinating and  vaginal pain.  Musculoskeletal: Positive for arthralgias and back pain.  All other systems reviewed and are negative.     Allergies  Review of patient's allergies indicates no known allergies.  Home Medications   Prior to Admission medications   Medication Sig Start Date End Date Taking? Authorizing Provider  acidophilus (RISAQUAD) CAPS Take 1 capsule by mouth daily. 08/02/12   Alonza Bogus, MD  albuterol (PROVENTIL HFA;VENTOLIN HFA) 108 (90 BASE) MCG/ACT inhaler Inhale 2 puffs into the lungs every 6 (six) hours as needed for wheezing or shortness of breath.     Historical Provider, MD  albuterol (PROVENTIL) (2.5 MG/3ML) 0.083% nebulizer solution Take 2.5 mg by nebulization 4 (four) times daily. 07/14/11   Kathie Dike, MD  ALPRAZolam Duanne Moron) 0.5 MG tablet Take 0.5 mg by mouth 4 (four) times daily.    Historical Provider, MD  Calcium Carbonate-Vitamin D (CALCIUM 600 + D PO) Take 1 tablet by mouth daily.    Historical Provider, MD  cephALEXin (KEFLEX) 500 MG capsule Take 1 capsule (500 mg total) by mouth 4 (four) times daily. 02/01/13   Alonza Bogus, MD  cetirizine (ZYRTEC) 10 MG tablet Take 10 mg by mouth daily as needed for allergies.    Historical Provider, MD  cyclobenzaprine (FLEXERIL) 5 MG tablet Take 5 mg by mouth 2 (two) times daily as needed for muscle spasms.     Historical Provider, MD  Fluticasone-Salmeterol (ADVAIR) 250-50 MCG/DOSE AEPB Inhale 1 puff into the lungs every 12 (twelve) hours. 07/14/11   Kathie Dike, MD  furosemide (LASIX) 20 MG tablet Take 1 tablet (20 mg total) by mouth daily. 02/26/13   Lendon Colonel, NP  gabapentin (NEURONTIN) 300 MG capsule Take 300 mg by mouth 2 (two) times daily. 07/14/11   Kathie Dike, MD  metFORMIN (GLUCOPHAGE) 500 MG tablet  02/22/13   Historical Provider, MD  methylPREDNIsolone (MEDROL DOSPACK) 4 MG tablet follow package directions 02/01/13   Alonza Bogus, MD  montelukast (SINGULAIR) 10 MG tablet Take 10 mg by mouth at bedtime.    Historical Provider, MD  omeprazole (PRILOSEC) 20 MG capsule  02/22/13   Historical Provider, MD  oxyCODONE-acetaminophen (PERCOCET/ROXICET) 5-325 MG per tablet Take 0.5 tablets by mouth 2 (two) times daily as needed for pain.    Historical Provider, MD  PARoxetine (PAXIL) 30 MG tablet Take 1 tablet (30 mg total) by mouth every morning. 07/14/11   Kathie Dike, MD  polyethylene glycol powder (GLYCOLAX/MIRALAX) powder Take 17 g by mouth daily. 07/14/11   Kathie Dike, MD  rosuvastatin (CRESTOR) 10 MG tablet Take 1 tablet (10 mg total) by mouth daily. 02/26/13    Lendon Colonel, NP  tiotropium (SPIRIVA) 18 MCG inhalation capsule Place 1 capsule (18 mcg total) into inhaler and inhale daily. 07/14/11   Kathie Dike, MD  traZODone (DESYREL) 50 MG tablet Take 50 mg by mouth at bedtime.    Historical Provider, MD   Triage Vitals: BP 165/95  Pulse 106  Temp(Src) 99 F (37.2 C) (Oral)  Resp 18  Ht 5\' 1"  (1.549 m)  Wt 140 lb (63.504 kg)  BMI 26.47 kg/m2  SpO2 100%  Physical Exam  Nursing note and vitals reviewed. Constitutional: She is oriented to person, place, and time. She appears well-developed and well-nourished.  HENT:  Head: Normocephalic and atraumatic.  Eyes: Conjunctivae and EOM are normal. Pupils are equal, round, and reactive to light.  Neck: Normal range of motion and phonation normal. Neck supple.  Cardiovascular: Normal rate,  regular rhythm and intact distal pulses.   Pulmonary/Chest: Effort normal and breath sounds normal. She exhibits no tenderness.  Abdominal: Soft. She exhibits no distension. There is no tenderness. There is no guarding.  Genitourinary:  No CVA tenderness.   Musculoskeletal: Normal range of motion.  Neurological: She is alert and oriented to person, place, and time. She exhibits normal muscle tone.  Skin: Skin is warm and dry.  Psychiatric: She has a normal mood and affect. Her behavior is normal. Judgment and thought content normal.    ED Course  Procedures (including critical care time)  DIAGNOSTIC STUDIES: Oxygen Saturation is 100% on room air, normal by my interpretation.    COORDINATION OF CARE: 1:03 PM- Ordered CBG and UA.   1:35 PM- Ordered CBC, BMP. Discussed treatment plan with patient at bedside and patient verbalized agreement.   Medications - No data to display  Patient Vitals for the past 24 hrs:  BP Temp Temp src Pulse Resp SpO2 Height Weight  08/30/13 1611 120/88 mmHg - - 94 18 99 % - -  08/30/13 1259 165/95 mmHg 99 F (37.2 C) Oral 106 18 100 % 5\' 1"  (1.549 m) 140 lb (63.504 kg)    Results for orders placed during the hospital encounter of 08/30/13  URINALYSIS, ROUTINE W REFLEX MICROSCOPIC      Result Value Ref Range   Color, Urine YELLOW  YELLOW   APPearance CLEAR  CLEAR   Specific Gravity, Urine 1.010  1.005 - 1.030   pH 5.5  5.0 - 8.0   Glucose, UA NEGATIVE  NEGATIVE mg/dL   Hgb urine dipstick TRACE (*) NEGATIVE   Bilirubin Urine NEGATIVE  NEGATIVE   Ketones, ur NEGATIVE  NEGATIVE mg/dL   Protein, ur NEGATIVE  NEGATIVE mg/dL   Urobilinogen, UA 0.2  0.0 - 1.0 mg/dL   Nitrite NEGATIVE  NEGATIVE   Leukocytes, UA NEGATIVE  NEGATIVE  CBC WITH DIFFERENTIAL      Result Value Ref Range   WBC 6.6  4.0 - 10.5 K/uL   RBC 5.15 (*) 3.87 - 5.11 MIL/uL   Hemoglobin 14.2  12.0 - 15.0 g/dL   HCT 65.4  65.0 - 35.4 %   MCV 82.1  78.0 - 100.0 fL   MCH 27.6  26.0 - 34.0 pg   MCHC 33.6  30.0 - 36.0 g/dL   RDW 65.6 (*) 81.2 - 75.1 %   Platelets 255  150 - 400 K/uL   Neutrophils Relative % 49  43 - 77 %   Neutro Abs 3.3  1.7 - 7.7 K/uL   Lymphocytes Relative 43  12 - 46 %   Lymphs Abs 2.9  0.7 - 4.0 K/uL   Monocytes Relative 6  3 - 12 %   Monocytes Absolute 0.4  0.1 - 1.0 K/uL   Eosinophils Relative 2  0 - 5 %   Eosinophils Absolute 0.1  0.0 - 0.7 K/uL   Basophils Relative 0  0 - 1 %   Basophils Absolute 0.0  0.0 - 0.1 K/uL  BASIC METABOLIC PANEL      Result Value Ref Range   Sodium 139  137 - 147 mEq/L   Potassium 3.5 (*) 3.7 - 5.3 mEq/L   Chloride 98  96 - 112 mEq/L   CO2 28  19 - 32 mEq/L   Glucose, Bld 96  70 - 99 mg/dL   BUN 10  6 - 23 mg/dL   Creatinine, Ser 7.00  0.50 - 1.10 mg/dL  Calcium 9.4  8.4 - 10.5 mg/dL   GFR calc non Af Amer 72 (*) >90 mL/min   GFR calc Af Amer 84 (*) >90 mL/min  URINE MICROSCOPIC-ADD ON      Result Value Ref Range   Squamous Epithelial / LPF FEW (*) RARE   WBC, UA 0-2  <3 WBC/hpf   RBC / HPF 0-2  <3 RBC/hpf   Bacteria, UA RARE  RARE  CBG MONITORING, ED      Result Value Ref Range   Glucose-Capillary 101 (*) 70 - 99 mg/dL      EKG Interpretation None      MDM   Final diagnoses:  Myalgia    Nonspecific myalgia with various vague somatic complaints. There is no evidence for serious bacterial infection, urinary tract infection, metabolic instability, or suspected intra-abdominal process.  Nursing Notes Reviewed/ Care Coordinated Applicable Imaging Reviewed Interpretation of Laboratory Data incorporated into ED treatment  The patient appears reasonably screened and/or stabilized for discharge and I doubt any other medical condition or other [facility] requiring further screening, evaluation, or treatment in the ED at this time prior to discharge.  Plan: Home Medications- usual; Home Treatments- rest; return here if the recommended treatment, does not improve the symptoms; Recommended follow up- PCP, when necessary   I personally performed the services described in this documentation, which was scribed in my presence. The recorded information has been reviewed and is accurate.       Richarda Blade, MD 08/30/13 2220

## 2013-08-30 NOTE — Discharge Instructions (Signed)
Use Tylenol, as needed for pain.    Musculoskeletal Pain Musculoskeletal pain is muscle and boney aches and pains. These pains can occur in any part of the body. Your caregiver may treat you without knowing the cause of the pain. They may treat you if blood or urine tests, X-rays, and other tests were normal.  CAUSES There is often not a definite cause or reason for these pains. These pains may be caused by a type of germ (virus). The discomfort may also come from overuse. Overuse includes working out too hard when your body is not fit. Boney aches also come from weather changes. Bone is sensitive to atmospheric pressure changes. HOME CARE INSTRUCTIONS   Ask when your test results will be ready. Make sure you get your test results.  Only take over-the-counter or prescription medicines for pain, discomfort, or fever as directed by your caregiver. If you were given medications for your condition, do not drive, operate machinery or power tools, or sign legal documents for 24 hours. Do not drink alcohol. Do not take sleeping pills or other medications that may interfere with treatment.  Continue all activities unless the activities cause more pain. When the pain lessens, slowly resume normal activities. Gradually increase the intensity and duration of the activities or exercise.  During periods of severe pain, bed rest may be helpful. Lay or sit in any position that is comfortable.  Putting ice on the injured area.  Put ice in a bag.  Place a towel between your skin and the bag.  Leave the ice on for 15 to 20 minutes, 3 to 4 times a day.  Follow up with your caregiver for continued problems and no reason can be found for the pain. If the pain becomes worse or does not go away, it may be necessary to repeat tests or do additional testing. Your caregiver may need to look further for a possible cause. SEEK IMMEDIATE MEDICAL CARE IF:  You have pain that is getting worse and is not relieved by  medications.  You develop chest pain that is associated with shortness or breath, sweating, feeling sick to your stomach (nauseous), or throw up (vomit).  Your pain becomes localized to the abdomen.  You develop any new symptoms that seem different or that concern you. MAKE SURE YOU:   Understand these instructions.  Will watch your condition.  Will get help right away if you are not doing well or get worse. Document Released: 04/25/2005 Document Revised: 07/18/2011 Document Reviewed: 12/28/2012 [facility] Patient Information 2014 [place].

## 2013-11-18 ENCOUNTER — Telehealth: Payer: Self-pay

## 2013-11-18 NOTE — Telephone Encounter (Signed)
Pt is still on Crestor but had to borrow money from family to be buy and is just now able to fill June script

## 2013-11-18 NOTE — Telephone Encounter (Signed)
Message copied by Bernita Raisin on Mon Nov 18, 2013  3:07 PM ------      Message from: [place] F      Created: Mon Nov 18, 2013  2:31 PM       We received a notice from [employer] the the patient may not have filled her crestor Rx in June. Can you varify she is still taking?                  Zandra Abts MD ------

## 2013-11-19 ENCOUNTER — Other Ambulatory Visit (HOSPITAL_COMMUNITY): Payer: Self-pay | Admitting: Pulmonary Disease

## 2013-11-19 DIAGNOSIS — R4182 Altered mental status, unspecified: Secondary | ICD-10-CM

## 2013-11-20 ENCOUNTER — Ambulatory Visit (HOSPITAL_COMMUNITY)
Admission: RE | Admit: 2013-11-20 | Discharge: 2013-11-20 | Disposition: A | Discharge status: Home / Self Care | Payer: PRIVATE HEALTH INSURANCE | Source: Ambulatory Visit | Attending: Pulmonary Disease | Admitting: Pulmonary Disease

## 2013-11-20 DIAGNOSIS — R4182 Altered mental status, unspecified: Secondary | ICD-10-CM | POA: Diagnosis present

## 2013-11-20 DIAGNOSIS — I6529 Occlusion and stenosis of unspecified carotid artery: Secondary | ICD-10-CM | POA: Insufficient documentation

## 2013-11-20 DIAGNOSIS — I658 Occlusion and stenosis of other precerebral arteries: Secondary | ICD-10-CM | POA: Insufficient documentation

## 2013-11-28 ENCOUNTER — Encounter (HOSPITAL_COMMUNITY): Payer: PRIVATE HEALTH INSURANCE

## 2014-01-02 ENCOUNTER — Encounter (HOSPITAL_COMMUNITY): Admission: RE | Admit: 2014-01-02 | Payer: PRIVATE HEALTH INSURANCE | Source: Ambulatory Visit

## 2014-01-10 ENCOUNTER — Telehealth: Payer: Self-pay | Admitting: Adult Health

## 2014-01-10 MED ORDER — FUROSEMIDE 20 MG PO TABS: 20.0000 mg | ORAL_TABLET | Freq: Every day | ORAL | Status: DC

## 2014-01-10 NOTE — Telephone Encounter (Signed)
Received fax refill request  Rx # [id number] Medication:  Furosemide 20 mg tablet Qty 30 Sig:  Take one tablet by mouth daily Physician:  Purcell Nails

## 2014-03-10 ENCOUNTER — Encounter (HOSPITAL_COMMUNITY): Payer: Self-pay | Admitting: Emergency Medicine

## 2014-04-07 ENCOUNTER — Emergency Department (HOSPITAL_COMMUNITY): Payer: PRIVATE HEALTH INSURANCE

## 2014-04-07 ENCOUNTER — Encounter (HOSPITAL_COMMUNITY): Payer: Self-pay | Admitting: *Deleted

## 2014-04-07 ENCOUNTER — Inpatient Hospital Stay (HOSPITAL_COMMUNITY)
Admission: EM | Admit: 2014-04-07 | Discharge: 2014-04-16 | DRG: 193 | Disposition: A | Discharge status: Home Health | Payer: PRIVATE HEALTH INSURANCE | Attending: Pulmonary Disease | Admitting: Pulmonary Disease

## 2014-04-07 DIAGNOSIS — R05 Cough: Secondary | ICD-10-CM

## 2014-04-07 DIAGNOSIS — Z79899 Other long term (current) drug therapy: Secondary | ICD-10-CM | POA: Diagnosis not present

## 2014-04-07 DIAGNOSIS — J441 Chronic obstructive pulmonary disease with (acute) exacerbation: Secondary | ICD-10-CM | POA: Diagnosis present

## 2014-04-07 DIAGNOSIS — J189 Pneumonia, unspecified organism: Secondary | ICD-10-CM | POA: Diagnosis present

## 2014-04-07 DIAGNOSIS — F329 Major depressive disorder, single episode, unspecified: Secondary | ICD-10-CM | POA: Diagnosis present

## 2014-04-07 DIAGNOSIS — M1990 Unspecified osteoarthritis, unspecified site: Secondary | ICD-10-CM | POA: Diagnosis present

## 2014-04-07 DIAGNOSIS — F17200 Nicotine dependence, unspecified, uncomplicated: Secondary | ICD-10-CM | POA: Diagnosis present

## 2014-04-07 DIAGNOSIS — I255 Ischemic cardiomyopathy: Secondary | ICD-10-CM | POA: Diagnosis present

## 2014-04-07 DIAGNOSIS — J45909 Unspecified asthma, uncomplicated: Secondary | ICD-10-CM | POA: Diagnosis present

## 2014-04-07 DIAGNOSIS — B9561 Methicillin susceptible Staphylococcus aureus infection as the cause of diseases classified elsewhere: Secondary | ICD-10-CM | POA: Diagnosis present

## 2014-04-07 DIAGNOSIS — I5022 Chronic systolic (congestive) heart failure: Secondary | ICD-10-CM | POA: Diagnosis present

## 2014-04-07 DIAGNOSIS — R7881 Bacteremia: Secondary | ICD-10-CM | POA: Diagnosis present

## 2014-04-07 DIAGNOSIS — F419 Anxiety disorder, unspecified: Secondary | ICD-10-CM | POA: Diagnosis present

## 2014-04-07 DIAGNOSIS — F17210 Nicotine dependence, cigarettes, uncomplicated: Secondary | ICD-10-CM | POA: Diagnosis present

## 2014-04-07 DIAGNOSIS — J440 Chronic obstructive pulmonary disease with acute lower respiratory infection: Secondary | ICD-10-CM | POA: Diagnosis present

## 2014-04-07 DIAGNOSIS — E119 Type 2 diabetes mellitus without complications: Secondary | ICD-10-CM | POA: Diagnosis present

## 2014-04-07 DIAGNOSIS — I252 Old myocardial infarction: Secondary | ICD-10-CM | POA: Diagnosis not present

## 2014-04-07 DIAGNOSIS — R059 Cough, unspecified: Secondary | ICD-10-CM

## 2014-04-07 DIAGNOSIS — I10 Essential (primary) hypertension: Secondary | ICD-10-CM | POA: Diagnosis present

## 2014-04-07 DIAGNOSIS — J9601 Acute respiratory failure with hypoxia: Secondary | ICD-10-CM | POA: Diagnosis present

## 2014-04-07 DIAGNOSIS — I2510 Atherosclerotic heart disease of native coronary artery without angina pectoris: Secondary | ICD-10-CM | POA: Diagnosis present

## 2014-04-07 DIAGNOSIS — Z720 Tobacco use: Secondary | ICD-10-CM | POA: Diagnosis present

## 2014-04-07 LAB — BASIC METABOLIC PANEL
Anion gap: 13 (ref 5–15)
BUN: 8 mg/dL (ref 6–23)
CO2: 30 mEq/L (ref 19–32)
Calcium: 9.8 mg/dL (ref 8.4–10.5)
Chloride: 100 mEq/L (ref 96–112)
Creatinine, Ser: 1.08 mg/dL (ref 0.50–1.10)
GFR calc Af Amer: 62 mL/min — ABNORMAL LOW (ref >90)
GFR calc non Af Amer: 54 mL/min — ABNORMAL LOW (ref >90)
Glucose, Bld: 94 mg/dL (ref 70–99)
Potassium: 3.9 mEq/L (ref 3.7–5.3)
Sodium: 143 mEq/L (ref 137–147)

## 2014-04-07 LAB — CBC WITH DIFFERENTIAL/PLATELET
Basophils Absolute: 0 10*3/uL (ref 0.0–0.1)
Basophils Relative: 1 % (ref 0–1)
Eosinophils Absolute: 0.9 10*3/uL — ABNORMAL HIGH (ref 0.0–0.7)
Eosinophils Relative: 11 % — ABNORMAL HIGH (ref 0–5)
HCT: 46.9 % — ABNORMAL HIGH (ref 36.0–46.0)
Hemoglobin: 15.5 g/dL — ABNORMAL HIGH (ref 12.0–15.0)
Lymphocytes Relative: 37 % (ref 12–46)
Lymphs Abs: 3.1 10*3/uL (ref 0.7–4.0)
MCH: 28.7 pg (ref 26.0–34.0)
MCHC: 33 g/dL (ref 30.0–36.0)
MCV: 86.9 fL (ref 78.0–100.0)
Monocytes Absolute: 0.5 10*3/uL (ref 0.1–1.0)
Monocytes Relative: 6 % (ref 3–12)
Neutro Abs: 3.8 10*3/uL (ref 1.7–7.7)
Neutrophils Relative %: 45 % (ref 43–77)
Platelets: 237 10*3/uL (ref 150–400)
RBC: 5.4 MIL/uL — ABNORMAL HIGH (ref 3.87–5.11)
RDW: 14.6 % (ref 11.5–15.5)
WBC: 8.3 10*3/uL (ref 4.0–10.5)

## 2014-04-07 MED ORDER — SODIUM CHLORIDE 0.9 % IV SOLN
INTRAVENOUS | Status: DC
  Administered 2014-04-07: 20:00:00 via INTRAVENOUS

## 2014-04-07 MED ORDER — LEVOFLOXACIN IN D5W 750 MG/150ML IV SOLN
750.0000 mg | INTRAVENOUS | Status: DC
  Administered 2014-04-08 – 2014-04-10 (×3): 750 mg via INTRAVENOUS
  Filled 2014-04-07 (×3): qty 150

## 2014-04-07 MED ORDER — LEVOTHYROXINE SODIUM 25 MCG PO TABS
25.0000 ug | ORAL_TABLET | Freq: Every day | ORAL | Status: DC
  Administered 2014-04-08 – 2014-04-16 (×9): 25 ug via ORAL
  Filled 2014-04-07 (×11): qty 1

## 2014-04-07 MED ORDER — RISAQUAD PO CAPS
1.0000 | ORAL_CAPSULE | Freq: Every day | ORAL | Status: DC
  Administered 2014-04-08 – 2014-04-16 (×9): 1 via ORAL
  Filled 2014-04-07 (×11): qty 1

## 2014-04-07 MED ORDER — IPRATROPIUM-ALBUTEROL 0.5-2.5 (3) MG/3ML IN SOLN
3.0000 mL | RESPIRATORY_TRACT | Status: DC
  Administered 2014-04-07 – 2014-04-16 (×39): 3 mL via RESPIRATORY_TRACT
  Filled 2014-04-07 (×41): qty 3

## 2014-04-07 MED ORDER — POLYETHYLENE GLYCOL 3350 17 G PO PACK
17.0000 g | PACK | Freq: Every day | ORAL | Status: DC
  Administered 2014-04-08 – 2014-04-16 (×9): 17 g via ORAL
  Filled 2014-04-07 (×9): qty 1

## 2014-04-07 MED ORDER — BUSPIRONE HCL 5 MG PO TABS
10.0000 mg | ORAL_TABLET | Freq: Three times a day (TID) | ORAL | Status: DC
  Administered 2014-04-08 – 2014-04-16 (×26): 10 mg via ORAL
  Filled 2014-04-07 (×30): qty 2

## 2014-04-07 MED ORDER — METHYLPREDNISOLONE SODIUM SUCC 125 MG IJ SOLR: 125.0000 mg | Freq: Two times a day (BID) | INTRAMUSCULAR | Status: DC

## 2014-04-07 MED ORDER — SODIUM CHLORIDE 0.9 % IV SOLN
INTRAVENOUS | Status: DC
  Administered 2014-04-08 – 2014-04-10 (×4): via INTRAVENOUS

## 2014-04-07 MED ORDER — ROSUVASTATIN CALCIUM 10 MG PO TABS
10.0000 mg | ORAL_TABLET | Freq: Every day | ORAL | Status: DC
  Administered 2014-04-08 – 2014-04-15 (×9): 10 mg via ORAL
  Filled 2014-04-07 (×11): qty 1

## 2014-04-07 MED ORDER — POLYETHYLENE GLYCOL 3350 17 GM/SCOOP PO POWD: 17.0000 g | Freq: Every day | ORAL | Status: DC

## 2014-04-07 MED ORDER — LEVOFLOXACIN IN D5W 750 MG/150ML IV SOLN
750.0000 mg | Freq: Once | INTRAVENOUS | Status: AC
  Administered 2014-04-07: 750 mg via INTRAVENOUS
  Filled 2014-04-07: qty 150

## 2014-04-07 MED ORDER — NICOTINE 14 MG/24HR TD PT24
14.0000 mg | MEDICATED_PATCH | Freq: Every day | TRANSDERMAL | Status: DC
  Administered 2014-04-08 – 2014-04-16 (×10): 14 mg via TRANSDERMAL
  Filled 2014-04-07 (×10): qty 1

## 2014-04-07 MED ORDER — GABAPENTIN 300 MG PO CAPS
300.0000 mg | ORAL_CAPSULE | Freq: Three times a day (TID) | ORAL | Status: DC
  Administered 2014-04-08 – 2014-04-16 (×26): 300 mg via ORAL
  Filled 2014-04-07 (×26): qty 1

## 2014-04-07 MED ORDER — IPRATROPIUM-ALBUTEROL 0.5-2.5 (3) MG/3ML IN SOLN
3.0000 mL | Freq: Once | RESPIRATORY_TRACT | Status: AC
  Administered 2014-04-07: 3 mL via RESPIRATORY_TRACT
  Filled 2014-04-07: qty 3

## 2014-04-07 MED ORDER — ENOXAPARIN SODIUM 30 MG/0.3ML ~~LOC~~ SOLN: 30.0000 mg | Freq: Two times a day (BID) | SUBCUTANEOUS | Status: DC

## 2014-04-07 MED ORDER — METHYLPREDNISOLONE SODIUM SUCC 125 MG IJ SOLR
125.0000 mg | Freq: Once | INTRAMUSCULAR | Status: AC
  Administered 2014-04-07: 125 mg via INTRAVENOUS
  Filled 2014-04-07: qty 2

## 2014-04-07 MED ORDER — ENOXAPARIN SODIUM 40 MG/0.4ML ~~LOC~~ SOLN
40.0000 mg | SUBCUTANEOUS | Status: DC
  Administered 2014-04-08 – 2014-04-16 (×8): 40 mg via SUBCUTANEOUS
  Filled 2014-04-07 (×9): qty 0.4

## 2014-04-07 MED ORDER — VITAMIN D (ERGOCALCIFEROL) 1.25 MG (50000 UNIT) PO CAPS
50000.0000 [IU] | ORAL_CAPSULE | ORAL | Status: DC
  Administered 2014-04-14: 50000 [IU] via ORAL
  Filled 2014-04-07: qty 1

## 2014-04-07 MED ORDER — PAROXETINE HCL 20 MG PO TABS
30.0000 mg | ORAL_TABLET | Freq: Every day | ORAL | Status: DC
  Administered 2014-04-08 – 2014-04-16 (×9): 30 mg via ORAL
  Filled 2014-04-07 (×2): qty 2
  Filled 2014-04-07: qty 1
  Filled 2014-04-07 (×6): qty 2
  Filled 2014-04-07: qty 1
  Filled 2014-04-07 (×2): qty 2

## 2014-04-07 MED ORDER — TRAZODONE HCL 50 MG PO TABS
25.0000 mg | ORAL_TABLET | Freq: Every evening | ORAL | Status: DC | PRN
  Administered 2014-04-08 (×2): 50 mg via ORAL
  Administered 2014-04-09 – 2014-04-14 (×3): 25 mg via ORAL
  Administered 2014-04-15: 50 mg via ORAL
  Filled 2014-04-07 (×6): qty 1

## 2014-04-07 MED ORDER — HYDROCODONE-ACETAMINOPHEN 5-325 MG PO TABS
1.0000 | ORAL_TABLET | Freq: Two times a day (BID) | ORAL | Status: DC
  Administered 2014-04-08 – 2014-04-16 (×18): 1 via ORAL
  Filled 2014-04-07 (×17): qty 1

## 2014-04-07 MED ORDER — IPRATROPIUM-ALBUTEROL 0.5-2.5 (3) MG/3ML IN SOLN
3.0000 mL | RESPIRATORY_TRACT | Status: DC | PRN
  Filled 2014-04-07: qty 3

## 2014-04-07 MED ORDER — LORATADINE 10 MG PO TABS
10.0000 mg | ORAL_TABLET | Freq: Every day | ORAL | Status: DC
  Administered 2014-04-08 – 2014-04-16 (×9): 10 mg via ORAL
  Filled 2014-04-07 (×10): qty 1

## 2014-04-07 MED ORDER — PNEUMOCOCCAL VAC POLYVALENT 25 MCG/0.5ML IJ INJ
0.5000 mL | INJECTION | INTRAMUSCULAR | Status: DC
Start: 2014-04-08 — End: 2014-04-16
  Filled 2014-04-07: qty 0.5

## 2014-04-07 MED ORDER — METHYLPREDNISOLONE SODIUM SUCC 125 MG IJ SOLR
125.0000 mg | Freq: Two times a day (BID) | INTRAMUSCULAR | Status: DC
  Administered 2014-04-08 – 2014-04-10 (×6): 125 mg via INTRAVENOUS
  Filled 2014-04-07 (×8): qty 2

## 2014-04-07 MED ORDER — TIOTROPIUM BROMIDE MONOHYDRATE 18 MCG IN CAPS
18.0000 ug | ORAL_CAPSULE | Freq: Every day | RESPIRATORY_TRACT | Status: DC
  Administered 2014-04-09 – 2014-04-13 (×5): 18 ug via RESPIRATORY_TRACT
  Filled 2014-04-07 (×2): qty 5

## 2014-04-07 MED ORDER — PANTOPRAZOLE SODIUM 40 MG PO TBEC
40.0000 mg | DELAYED_RELEASE_TABLET | Freq: Every day | ORAL | Status: DC
  Administered 2014-04-08 – 2014-04-16 (×9): 40 mg via ORAL
  Filled 2014-04-07 (×9): qty 1

## 2014-04-07 NOTE — H&P (Signed)
Hospitalist Admission History and Physical  Patient name: Kristin Wells Medical record number: [id number] Date of birth: Oct 10, 1950 Age: 63 y.o. Gender: female  Primary Care Provider: Alonza Bogus, MD  Chief Complaint: acute resp failure w/ hypoxia, CAP, COPD, tobacco abuse   History of Present Illness:This is a 63 y.o. year old female with significant past medical history of COPD w/ nighttime O2 use, type 2 DM, CAD, systolic CHF, tobacco abuse  presenting with acute resp failure w/ hypoxia, CAP, COPD, tobacco abuse presenting with acute resp failure w/ hypoxia, CAP, COPD, tobacco abuse. Patient reports persistent cough, increased work of breathing, wheezing over the past 2-3 weeks. Denies any fevers or chills. No chest pain. Still smoking up to one half pack per day. Uses nighttime oxygen. States she's been using a night as well to help with increased work of breathing. Increased inhaler use.  Present to the ER temp 98.2, heart rate in the 90s to 110s, respirations 20s, blood pressure in the 100s, satting 86% on room air-improved to 97% on 2 L. White blood cell count 8.3, hemoglobin 15.5, platelets 237, creatinine 1.08. Chest x-ray shows mild lingular and possible right middle lobe pneumonia. Started on Levaquin in the ER. Also received IV Solu-Medrol.  Assessment and Plan: Kristin Wells is a 63 y.o. year old female presenting with Acute resp failure w/ hypoxi a   Active Problems:   Acute respiratory failure with hypoxia   1- Acute resp failure w/ hypoxia  -overlapping CAP and COPD  -IV levaquin -solumedrol  -scheduled nebs -blood cultures  -urine strep and legionella  -tobacco abuse counseling    2- type 2 DM  -SSI  -A1C -watch sugars in setting of glucocorticoid use   3- CAD/HTN -BP stable  -no CP  -tele bed   FEN/GI: heart healthy/carb modified diet  Prophylaxis: sub q heparin  Disposition: pending further evaluation  Code Status:Full Code    Patient Active  Problem List   Diagnosis Date Noted  . Acute respiratory failure with hypoxia 04/07/2014  . Rectocele 10/09/2012  . Diabetes 10/05/2012  . CAD (coronary artery disease) 02/21/2012  . Systolic CHF, chronic 27/51/7001  . Chronic back pain 02/20/2012  . Hypercapnia 07/30/2011  . Lethargy 07/30/2011  . HTN (hypertension) 07/11/2011  . Tobacco abuse 07/11/2011  . Anxiety disorder 07/11/2011  . Hyperglycemia 06/28/2011  . Thyroid enlargement 06/28/2011  . Ischemic cardiomyopathy 06/28/2011  . HYPOKALEMIA 02/04/2010  . SMOKER 02/04/2010  . DEPRESSION 02/04/2010  . Other chronic pain 02/04/2010  . COPD 02/04/2010   Past Medical History: Past Medical History  Diagnosis Date  . COPD (chronic obstructive pulmonary disease)   . Asthma   . Bronchitis   . Anxiety   . Back pain   . Demand ischemia of myocardium 06/28/2011  . Thyroid enlargement 06/28/2011  . MI, acute, non ST segment elevation 06/29/2011    Myoview stress test revealed mild perfusion defect  . Ischemic cardiomyopathy 06/28/2011    EF 25%. Myoview stress test later showed ejection fraction of 65%  . PNA (pneumonia) 06/29/2011  . Acute respiratory failure 06/2011    Vent dependent sec to COPD/PNA  . Hypertension   . Coronary artery disease   . Myocardial infarction   . Pneumonia   . Diabetes mellitus without complication   . Arthritis   . Rectocele 10/09/2012  . CHF (congestive heart failure)     Past Surgical History: Past Surgical History  Procedure Laterality Date  . Esophagogastroduodenoscopy  Feb 2013  Dr. Michail Sermon: normal duodenum, candida esophagitis, reactive gastropathy  . Colonoscopy  Feb 2013    Dr. Michail Sermon: internal hemorrhoids, normal view of ileum, multiple hyperplastic polyps  . Tubal ligation      Social History: History   Social History  . Marital Status: Divorced    Spouse Name: N/A    Number of Children: N/A  . Years of Education: N/A   Social History Main Topics  . Smoking status:  Current Every Day Smoker -- 0.50 packs/day for 44 years    Types: Cigarettes  . Smokeless tobacco: Never Used  . Alcohol Use: No  . Drug Use: No  . Sexual Activity: No   Other Topics Concern  . None   Social History Narrative    Family History: Family History  Problem Relation Age of Onset  . Diabetes Mother   . Hypertension Mother   . Diabetes Father   . Hypertension Father   . Colon cancer Neg Hx     Allergies: No Known Allergies  Current Facility-Administered Medications  Medication Dose Route Frequency Provider Last Rate Last Dose  . 0.9 %  sodium chloride infusion   Intravenous Continuous Fredia Sorrow, MD 50 mL/hr at 04/07/14 2006    . 0.9 %  sodium chloride infusion   Intravenous Continuous Shanda Howells, MD      . ACIDOPHILUS PROBIOTIC CAPS 100 mg  1 capsule Oral Daily Shanda Howells, MD      . busPIRone (BUSPAR) tablet 10 mg  10 mg Oral TID Shanda Howells, MD      . enoxaparin (LOVENOX) injection 30 mg  30 mg Subcutaneous Q12H Shanda Howells, MD      . gabapentin (NEURONTIN) capsule 300 mg  300 mg Oral TID Shanda Howells, MD      . HYDROcodone-acetaminophen (NORCO/VICODIN) 5-325 MG per tablet 1 tablet  1 tablet Oral BID Shanda Howells, MD      . ipratropium-albuterol (DUONEB) 0.5-2.5 (3) MG/3ML nebulizer solution 3 mL  3 mL Nebulization Once Fredia Sorrow, MD      . ipratropium-albuterol (DUONEB) 0.5-2.5 (3) MG/3ML nebulizer solution 3 mL  3 mL Nebulization Q4H Shanda Howells, MD      . ipratropium-albuterol (DUONEB) 0.5-2.5 (3) MG/3ML nebulizer solution 3 mL  3 mL Nebulization Q2H PRN Shanda Howells, MD      . levofloxacin (LEVAQUIN) IVPB 750 mg  750 mg Intravenous Once Fredia Sorrow, MD 100 mL/hr at 04/07/14 2016 750 mg at 04/07/14 2016  . levofloxacin (LEVAQUIN) IVPB 750 mg  750 mg Intravenous Q24H Shanda Howells, MD      . levothyroxine (SYNTHROID, LEVOTHROID) tablet 25 mcg  25 mcg Oral Daily Shanda Howells, MD      . loratadine (CLARITIN) tablet 10 mg  10 mg Oral  Daily Shanda Howells, MD      . methylPREDNISolone sodium succinate (SOLU-MEDROL) 125 mg/2 mL injection 125 mg  125 mg Intravenous Q12H Shanda Howells, MD      . pantoprazole (PROTONIX) EC tablet 40 mg  40 mg Oral Daily Shanda Howells, MD      . Derrill Memo ON 04/08/2014] PARoxetine (PAXIL) tablet 30 mg  30 mg Oral Aaron Mose, MD      . polyethylene glycol powder (GLYCOLAX/MIRALAX) container 17 g  17 g Oral Daily Shanda Howells, MD      . Derrill Memo ON 04/08/2014] rosuvastatin (CRESTOR) tablet 10 mg  10 mg Oral q1800 Shanda Howells, MD      . tiotropium [facility]) inhalation capsule 18 mcg  18 mcg Inhalation Daily Shanda Howells, MD      . traZODone (DESYREL) tablet 25-50 mg  25-50 mg Oral QHS PRN Shanda Howells, MD      . Vitamin D (Ergocalciferol) (DRISDOL) capsule 50,000 Units  50,000 Units Oral Q7 days Shanda Howells, MD       Current Outpatient Prescriptions  Medication Sig Dispense Refill  . albuterol (PROVENTIL HFA;VENTOLIN HFA) 108 (90 BASE) MCG/ACT inhaler Inhale 2 puffs into the lungs every 6 (six) hours as needed for wheezing or shortness of breath.    Marland Kitchen albuterol (PROVENTIL) (2.5 MG/3ML) 0.083% nebulizer solution Take 2.5 mg by nebulization 4 (four) times daily.     . Black Cohosh 540 MG CAPS Take 1 capsule by mouth daily.    . busPIRone (BUSPAR) 10 MG tablet Take 10 mg by mouth 3 (three) times daily.    . cetirizine (ZYRTEC) 10 MG tablet Take 10 mg by mouth daily as needed for allergies.    . cyclobenzaprine (FLEXERIL) 10 MG tablet Take 10 mg by mouth 2 (two) times daily.    . furosemide (LASIX) 20 MG tablet Take 1 tablet (20 mg total) by mouth daily. 30 tablet 6  . gabapentin (NEURONTIN) 300 MG capsule Take 300 mg by mouth 3 (three) times daily.     Marland Kitchen HYDROcodone-acetaminophen (NORCO/VICODIN) 5-325 MG per tablet Take 1 tablet by mouth 2 (two) times daily.    . Lactobacillus (ACIDOPHILUS PROBIOTIC) 100 MG CAPS Take 1 capsule by mouth daily.    Marland Kitchen levothyroxine (SYNTHROID, LEVOTHROID) 25 MCG  tablet Take 25 mcg by mouth daily.    . metFORMIN (GLUCOPHAGE) 500 MG tablet Take 500 mg by mouth 2 (two) times daily with a meal.     . pantoprazole (PROTONIX) 40 MG tablet Take 40 mg by mouth daily.    Marland Kitchen PARoxetine (PAXIL) 30 MG tablet Take 1 tablet (30 mg total) by mouth every morning. 30 tablet 1  . polyethylene glycol powder (GLYCOLAX/MIRALAX) powder Take 17 g by mouth daily. (Patient taking differently: Take 17 g by mouth daily as needed for mild constipation or moderate constipation. ) 255 g 1  . rosuvastatin (CRESTOR) 10 MG tablet Take 10 mg by mouth daily at 6 PM.    . tiotropium (SPIRIVA) 18 MCG inhalation capsule Place 1 capsule (18 mcg total) into inhaler and inhale daily. 30 capsule 1  . traZODone (DESYREL) 50 MG tablet Take 25-50 mg by mouth at bedtime as needed for sleep.     . Vitamin D, Ergocalciferol, (DRISDOL) 50000 UNITS CAPS capsule Take 50,000 Units by mouth every 7 (seven) days.    Marland Kitchen acidophilus (RISAQUAD) CAPS Take 1 capsule by mouth daily. (Patient not taking: Reported on 04/07/2014) 30 capsule 5  . busPIRone (BUSPAR) 5 MG tablet Take 5 mg by mouth 3 (three) times daily.    . Fluticasone-Salmeterol (ADVAIR) 250-50 MCG/DOSE AEPB Inhale 1 puff into the lungs every 12 (twelve) hours. (Patient not taking: Reported on 04/07/2014) 60 each 1   Review Of Systems: 12 point ROS negative except as noted above in HPI.  Physical Exam: Filed Vitals:   04/07/14 2100  BP: 107/93  Pulse: 97  Temp:   Resp:     General: alert and cooperative HEENT: PERRLA and extra ocular movement intact Heart: S1, S2 normal, no murmur, rub or gallop, regular rate and rhythm Lungs: unlabored breathing and expiratory wheezes Abdomen: abdomen is soft without significant tenderness, masses, organomegaly or guarding Extremities: extremities normal, atraumatic, no cyanosis or edema  Skin:no rashes, no ecchymoses Neurology: normal without focal findings  Labs and Imaging: Lab Results  Component  Value Date/Time   NA 143 04/07/2014 07:45 PM   K 3.9 04/07/2014 07:45 PM   CL 100 04/07/2014 07:45 PM   CO2 30 04/07/2014 07:45 PM   BUN 8 04/07/2014 07:45 PM   CREATININE 1.08 04/07/2014 07:45 PM   GLUCOSE 94 04/07/2014 07:45 PM   Lab Results  Component Value Date   WBC 8.3 04/07/2014   HGB 15.5* 04/07/2014   HCT 46.9* 04/07/2014   MCV 86.9 04/07/2014   PLT 237 04/07/2014    Dg Chest 2 View  04/07/2014   CLINICAL DATA:  Shortness of breath.  EXAM: CHEST  2 VIEW  COMPARISON:  01/29/2013.  FINDINGS: Mediastinum and hilar structures are normal. Lingular infiltrate is present. Possible right middle lobe infiltrate. No pleural effusion or pneumothorax. Degenerative changes thoracic spine.  IMPRESSION: Mild lingular and possibly right middle lobe pulmonary infiltrate consistent with pneumonia.   Electronically Signed   By: Impact   On: 04/07/2014 17:10           Shanda Howells MD  Pager: [phone]

## 2014-04-07 NOTE — ED Notes (Signed)
Productive cough x 2 weeks, white in color. Hx of both asthma and bronchitis. Pt took a breathing tx at home but states, "I just can't shake it off"

## 2014-04-07 NOTE — ED Provider Notes (Signed)
CSN: [id number]     Arrival date & time 04/07/14  1643 History   First MD Initiated Contact with Patient 04/07/14 1920     Chief Complaint  Patient presents with  . Asthma     (Consider location/radiation/quality/duration/timing/severity/associated sxs/prior Treatment) Patient is a 63 y.o. female presenting with asthma. The history is provided by the patient.  Asthma Associated symptoms include shortness of breath. Pertinent negatives include no chest pain, no abdominal pain and no headaches.   patient with 2 week history of cough now productive white in color. No blood. Patient has a history of COPD. Patient has oxygen at home and uses 2 L at night to sleep. Patient just not feeling like she can get the phlegm up and feeling short of breath. Having to use her oxygen more frequently. No fevers. Patient without any recent admissions.  Past Medical History  Diagnosis Date  . COPD (chronic obstructive pulmonary disease)   . Asthma   . Bronchitis   . Anxiety   . Back pain   . Demand ischemia of myocardium 06/28/2011  . Thyroid enlargement 06/28/2011  . MI, acute, non ST segment elevation 06/29/2011    Myoview stress test revealed mild perfusion defect  . Ischemic cardiomyopathy 06/28/2011    EF 25%. Myoview stress test later showed ejection fraction of 65%  . PNA (pneumonia) 06/29/2011  . Acute respiratory failure 06/2011    Vent dependent sec to COPD/PNA  . Hypertension   . Coronary artery disease   . Myocardial infarction   . Pneumonia   . Diabetes mellitus without complication   . Arthritis   . Rectocele 10/09/2012  . CHF (congestive heart failure)    Past Surgical History  Procedure Laterality Date  . Esophagogastroduodenoscopy  Feb 2013    Dr. Michail Sermon: normal duodenum, candida esophagitis, reactive gastropathy  . Colonoscopy  Feb 2013    Dr. Michail Sermon: internal hemorrhoids, normal view of ileum, multiple hyperplastic polyps  . Tubal ligation     Family History  Problem  Relation Age of Onset  . Diabetes Mother   . Hypertension Mother   . Diabetes Father   . Hypertension Father   . Colon cancer Neg Hx    History  Substance Use Topics  . Smoking status: Current Every Day Smoker -- 0.50 packs/day for 44 years    Types: Cigarettes  . Smokeless tobacco: Never Used  . Alcohol Use: No   OB History    Gravida Para Term Preterm AB TAB SAB Ectopic Multiple Living   8 7   1  1   6      Review of Systems  Constitutional: Positive for fatigue. Negative for fever.  HENT: Positive for congestion.   Eyes: Negative for visual disturbance.  Respiratory: Positive for cough, shortness of breath and wheezing.   Cardiovascular: Negative for chest pain.  Gastrointestinal: Negative for nausea, vomiting and abdominal pain.  Genitourinary: Negative for dysuria.  Musculoskeletal: Negative for back pain.  Skin: Negative for rash.  Neurological: Negative for headaches.  Hematological: Does not bruise/bleed easily.  Psychiatric/Behavioral: Negative for confusion.      Allergies  Review of patient's allergies indicates no known allergies.  Home Medications   Prior to Admission medications   Medication Sig Start Date End Date Taking? Authorizing Provider  acidophilus (RISAQUAD) CAPS Take 1 capsule by mouth daily. 08/02/12   Alonza Bogus, MD  albuterol (PROVENTIL HFA;VENTOLIN HFA) 108 (90 BASE) MCG/ACT inhaler Inhale 2 puffs into the lungs every 6 (six)  hours as needed for wheezing or shortness of breath.    Historical Provider, MD  albuterol (PROVENTIL) (2.5 MG/3ML) 0.083% nebulizer solution Take 2.5 mg by nebulization 3 (three) times daily.  07/14/11   Kathie Dike, MD  busPIRone (BUSPAR) 5 MG tablet Take 5 mg by mouth 3 (three) times daily. 08/05/13   Historical Provider, MD  Calcium Carbonate-Vitamin D (CALCIUM 600 + D PO) Take 1 tablet by mouth every 7 (seven) days.     Historical Provider, MD  cetirizine (ZYRTEC) 10 MG tablet Take 10 mg by mouth daily as  needed for allergies.    Historical Provider, MD  cyclobenzaprine (FLEXERIL) 5 MG tablet Take 5 mg by mouth 2 (two) times daily as needed for muscle spasms.     Historical Provider, MD  Fluticasone-Salmeterol (ADVAIR) 250-50 MCG/DOSE AEPB Inhale 1 puff into the lungs every 12 (twelve) hours. 07/14/11   Kathie Dike, MD  furosemide (LASIX) 20 MG tablet Take 1 tablet (20 mg total) by mouth daily. 01/10/14   Lendon Colonel, NP  gabapentin (NEURONTIN) 300 MG capsule Take 300 mg by mouth 2 (two) times daily. 07/14/11   Kathie Dike, MD  levothyroxine (SYNTHROID, LEVOTHROID) 25 MCG tablet Take 25 mcg by mouth daily. 08/29/13   Historical Provider, MD  metFORMIN (GLUCOPHAGE) 500 MG tablet Take 500 mg by mouth 2 (two) times daily with a meal.  02/22/13   Historical Provider, MD  montelukast (SINGULAIR) 10 MG tablet Take 10 mg by mouth daily.     Historical Provider, MD  omeprazole (PRILOSEC) 20 MG capsule Take 20 mg by mouth daily.  02/22/13   Historical Provider, MD  PARoxetine (PAXIL) 30 MG tablet Take 1 tablet (30 mg total) by mouth every morning. 07/14/11   Kathie Dike, MD  polyethylene glycol powder (GLYCOLAX/MIRALAX) powder Take 17 g by mouth daily. 07/14/11   Kathie Dike, MD  rosuvastatin (CRESTOR) 10 MG tablet Take 10 mg by mouth daily at 6 PM. 02/26/13   Lendon Colonel, NP  tiotropium (SPIRIVA) 18 MCG inhalation capsule Place 1 capsule (18 mcg total) into inhaler and inhale daily. 07/14/11   Kathie Dike, MD  traZODone (DESYREL) 50 MG tablet Take 25 mg by mouth at bedtime.     Historical Provider, MD   BP 104/89 mmHg  Pulse 107  Temp(Src) 98.2 F (36.8 C) (Oral)  Resp 24  Ht 5\' 1"  (1.549 m)  Wt 130 lb (58.968 kg)  BMI 24.58 kg/m2  SpO2 95% Physical Exam  Constitutional: She is oriented to person, place, and time. She appears well-developed and well-nourished. No distress.  HENT:  Head: Normocephalic and atraumatic.  Mouth/Throat: Oropharynx is clear and moist.  Eyes: Conjunctivae  and EOM are normal. Pupils are equal, round, and reactive to light.  Neck: Normal range of motion.  Cardiovascular: Regular rhythm and normal heart sounds.   No murmur heard. Slightly tachycardic  Pulmonary/Chest: Effort normal. She has wheezes.  Abdominal: Soft. Bowel sounds are normal. There is no tenderness.  Musculoskeletal: Normal range of motion.  Neurological: She is alert and oriented to person, place, and time. No cranial nerve deficit. She exhibits normal muscle tone. Coordination normal.  Skin: Skin is warm. No rash noted.  Nursing note and vitals reviewed.   ED Course  Procedures (including critical care time) Labs Review Labs Reviewed  CBC WITH DIFFERENTIAL - Abnormal; Notable for the following:    RBC 5.40 (*)    Hemoglobin 15.5 (*)    HCT 46.9 (*)  Eosinophils Relative 11 (*)    Eosinophils Absolute 0.9 (*)    All other components within normal limits  BASIC METABOLIC PANEL - Abnormal; Notable for the following:    GFR calc non Af Amer 54 (*)    GFR calc Af Amer 62 (*)    All other components within normal limits  CULTURE, BLOOD (ROUTINE X 2)  CULTURE, BLOOD (ROUTINE X 2)   Results for orders placed or performed during the hospital encounter of 04/07/14  CBC with Differential  Result Value Ref Range   WBC 8.3 4.0 - 10.5 K/uL   RBC 5.40 (H) 3.87 - 5.11 MIL/uL   Hemoglobin 15.5 (H) 12.0 - 15.0 g/dL   HCT 46.9 (H) 36.0 - 46.0 %   MCV 86.9 78.0 - 100.0 fL   MCH 28.7 26.0 - 34.0 pg   MCHC 33.0 30.0 - 36.0 g/dL   RDW 14.6 11.5 - 15.5 %   Platelets 237 150 - 400 K/uL   Neutrophils Relative % 45 43 - 77 %   Neutro Abs 3.8 1.7 - 7.7 K/uL   Lymphocytes Relative 37 12 - 46 %   Lymphs Abs 3.1 0.7 - 4.0 K/uL   Monocytes Relative 6 3 - 12 %   Monocytes Absolute 0.5 0.1 - 1.0 K/uL   Eosinophils Relative 11 (H) 0 - 5 %   Eosinophils Absolute 0.9 (H) 0.0 - 0.7 K/uL   Basophils Relative 1 0 - 1 %   Basophils Absolute 0.0 0.0 - 0.1 K/uL  Basic metabolic panel   Result Value Ref Range   Sodium 143 137 - 147 mEq/L   Potassium 3.9 3.7 - 5.3 mEq/L   Chloride 100 96 - 112 mEq/L   CO2 30 19 - 32 mEq/L   Glucose, Bld 94 70 - 99 mg/dL   BUN 8 6 - 23 mg/dL   Creatinine, Ser 1.08 0.50 - 1.10 mg/dL   Calcium 9.8 8.4 - 10.5 mg/dL   GFR calc non Af Amer 54 (L) >90 mL/min   GFR calc Af Amer 62 (L) >90 mL/min   Anion gap 13 5 - 15     Imaging Review Dg Chest 2 View  04/07/2014   CLINICAL DATA:  Shortness of breath.  EXAM: CHEST  2 VIEW  COMPARISON:  01/29/2013.  FINDINGS: Mediastinum and hilar structures are normal. Lingular infiltrate is present. Possible right middle lobe infiltrate. No pleural effusion or pneumothorax. Degenerative changes thoracic spine.  IMPRESSION: Mild lingular and possibly right middle lobe pulmonary infiltrate consistent with pneumonia.   Electronically Signed   By: Marcello Moores  Register   On: 04/07/2014 17:10     EKG Interpretation None      MDM   Final diagnoses:  CAP (community acquired pneumonia)  COPD exacerbation    Patient with a history of COPD. Patient with a productive cough for 2 weeks white in color. Now feeling short of breath. No fevers. Patient has had to use her oxygen at home more than usual. Normally on 2 L at bedtime and occasionally during the day. Patient with bilateral wheezing here albuterol Atrovent nebulizer given patient without significant improvement. Second the albuterol Atrovent nebulizer ordered. Patient received Solu-Medrol 125 mg IV. In addition chest x-ray raises concern for possible pneumonia. This would be a community-acquired pneumonia. Patient received Levaquin here IV. After patient had blood cultures done. Patient will require admission for the exacerbation of his COPD. Patient is nontoxic regarding the pneumonia. Patient on 2 L of oxygen here  is satting 95%. Off of that she was satting below 90%. Patient still with wheezing not significantly worse than when she arrived. No acute  distress.    Fredia Sorrow, MD 04/07/14 2115

## 2014-04-08 LAB — COMPREHENSIVE METABOLIC PANEL
ALT: 8 U/L (ref 0–35)
AST: 6 U/L (ref 0–37)
Albumin: 3.2 g/dL — ABNORMAL LOW (ref 3.5–5.2)
Alkaline Phosphatase: 82 U/L (ref 39–117)
Anion gap: 11 (ref 5–15)
BUN: 9 mg/dL (ref 6–23)
CO2: 25 mEq/L (ref 19–32)
Calcium: 8.9 mg/dL (ref 8.4–10.5)
Chloride: 105 mEq/L (ref 96–112)
Creatinine, Ser: 0.94 mg/dL (ref 0.50–1.10)
GFR calc Af Amer: 74 mL/min — ABNORMAL LOW (ref >90)
GFR calc non Af Amer: 64 mL/min — ABNORMAL LOW (ref >90)
Glucose, Bld: 202 mg/dL — ABNORMAL HIGH (ref 70–99)
Potassium: 4.4 mEq/L (ref 3.7–5.3)
Sodium: 141 mEq/L (ref 137–147)
Total Bilirubin: 0.2 mg/dL — ABNORMAL LOW (ref 0.3–1.2)
Total Protein: 6.3 g/dL (ref 6.0–8.3)

## 2014-04-08 LAB — CBC WITH DIFFERENTIAL/PLATELET
Basophils Absolute: 0 10*3/uL (ref 0.0–0.1)
Basophils Relative: 0 % (ref 0–1)
Eosinophils Absolute: 0 10*3/uL (ref 0.0–0.7)
Eosinophils Relative: 0 % (ref 0–5)
HCT: 40.8 % (ref 36.0–46.0)
Hemoglobin: 13.4 g/dL (ref 12.0–15.0)
Lymphocytes Relative: 13 % (ref 12–46)
Lymphs Abs: 0.6 10*3/uL — ABNORMAL LOW (ref 0.7–4.0)
MCH: 28.3 pg (ref 26.0–34.0)
MCHC: 32.8 g/dL (ref 30.0–36.0)
MCV: 86.1 fL (ref 78.0–100.0)
Monocytes Absolute: 0.1 10*3/uL (ref 0.1–1.0)
Monocytes Relative: 1 % — ABNORMAL LOW (ref 3–12)
Neutro Abs: 4.2 10*3/uL (ref 1.7–7.7)
Neutrophils Relative %: 86 % — ABNORMAL HIGH (ref 43–77)
Platelets: 213 10*3/uL (ref 150–400)
RBC: 4.74 MIL/uL (ref 3.87–5.11)
RDW: 14.2 % (ref 11.5–15.5)
WBC: 4.9 10*3/uL (ref 4.0–10.5)

## 2014-04-08 LAB — HEMOGLOBIN A1C
Hgb A1c MFr Bld: 6.4 % — ABNORMAL HIGH (ref <5.7)
Mean Plasma Glucose: 137 mg/dL — ABNORMAL HIGH (ref <117)

## 2014-04-08 LAB — GLUCOSE, CAPILLARY
Glucose-Capillary: 132 mg/dL — ABNORMAL HIGH (ref 70–99)
Glucose-Capillary: 165 mg/dL — ABNORMAL HIGH (ref 70–99)
Glucose-Capillary: 200 mg/dL — ABNORMAL HIGH (ref 70–99)
Glucose-Capillary: 224 mg/dL — ABNORMAL HIGH (ref 70–99)
Glucose-Capillary: 275 mg/dL — ABNORMAL HIGH (ref 70–99)

## 2014-04-08 LAB — HIV ANTIBODY (ROUTINE TESTING W REFLEX): HIV 1&2 Ab, 4th Generation: NONREACTIVE

## 2014-04-08 LAB — STREP PNEUMONIAE URINARY ANTIGEN: Strep Pneumo Urinary Antigen: NEGATIVE

## 2014-04-08 MED ORDER — LORAZEPAM 2 MG/ML IJ SOLN
INTRAMUSCULAR | Status: AC
  Administered 2014-04-08: 0.5 mg via INTRAVENOUS
  Filled 2014-04-08: qty 1

## 2014-04-08 MED ORDER — VANCOMYCIN HCL IN DEXTROSE 750-5 MG/150ML-% IV SOLN
750.0000 mg | Freq: Two times a day (BID) | INTRAVENOUS | Status: DC
  Administered 2014-04-09 – 2014-04-11 (×5): 750 mg via INTRAVENOUS
  Filled 2014-04-08 (×5): qty 150

## 2014-04-08 MED ORDER — INSULIN ASPART 100 UNIT/ML ~~LOC~~ SOLN
0.0000 [IU] | Freq: Three times a day (TID) | SUBCUTANEOUS | Status: DC
  Administered 2014-04-08: 7 [IU] via SUBCUTANEOUS
  Administered 2014-04-08 – 2014-04-09 (×2): 4 [IU] via SUBCUTANEOUS
  Administered 2014-04-09: 3 [IU] via SUBCUTANEOUS
  Administered 2014-04-09 – 2014-04-10 (×2): 4 [IU] via SUBCUTANEOUS
  Administered 2014-04-10: 3 [IU] via SUBCUTANEOUS
  Administered 2014-04-11: 4 [IU] via SUBCUTANEOUS
  Administered 2014-04-11: 3 [IU] via SUBCUTANEOUS
  Administered 2014-04-11 – 2014-04-12 (×2): 4 [IU] via SUBCUTANEOUS
  Administered 2014-04-12: 3 [IU] via SUBCUTANEOUS
  Administered 2014-04-12: 7 [IU] via SUBCUTANEOUS
  Administered 2014-04-13 (×2): 4 [IU] via SUBCUTANEOUS
  Administered 2014-04-13 – 2014-04-14 (×2): 3 [IU] via SUBCUTANEOUS
  Administered 2014-04-14 – 2014-04-15 (×3): 4 [IU] via SUBCUTANEOUS
  Administered 2014-04-15: 3 [IU] via SUBCUTANEOUS
  Administered 2014-04-15: 7 [IU] via SUBCUTANEOUS
  Administered 2014-04-16 (×2): 3 [IU] via SUBCUTANEOUS

## 2014-04-08 MED ORDER — INSULIN ASPART 100 UNIT/ML ~~LOC~~ SOLN
0.0000 [IU] | Freq: Every day | SUBCUTANEOUS | Status: DC
  Administered 2014-04-08 – 2014-04-10 (×2): 2 [IU] via SUBCUTANEOUS

## 2014-04-08 MED ORDER — VANCOMYCIN HCL 10 G IV SOLR
1250.0000 mg | Freq: Once | INTRAVENOUS | Status: AC
  Administered 2014-04-08: 1250 mg via INTRAVENOUS
  Filled 2014-04-08: qty 1250

## 2014-04-08 MED ORDER — GUAIFENESIN ER 600 MG PO TB12
1200.0000 mg | ORAL_TABLET | Freq: Two times a day (BID) | ORAL | Status: DC
  Administered 2014-04-08 – 2014-04-16 (×17): 1200 mg via ORAL
  Filled 2014-04-08 (×17): qty 2

## 2014-04-08 MED ORDER — LORAZEPAM 1 MG PO TABS
1.0000 mg | ORAL_TABLET | Freq: Four times a day (QID) | ORAL | Status: DC | PRN
  Administered 2014-04-09 – 2014-04-10 (×2): 1 mg via ORAL
  Filled 2014-04-08 (×2): qty 1

## 2014-04-08 MED ORDER — LORAZEPAM 2 MG/ML IJ SOLN
0.5000 mg | Freq: Once | INTRAMUSCULAR | Status: AC
  Administered 2014-04-08: 0.5 mg via INTRAVENOUS

## 2014-04-08 MED ORDER — BISACODYL 10 MG RE SUPP
10.0000 mg | Freq: Every day | RECTAL | Status: DC | PRN
  Administered 2014-04-08 – 2014-04-13 (×3): 10 mg via RECTAL
  Filled 2014-04-08 (×3): qty 1

## 2014-04-08 NOTE — Plan of Care (Signed)
Problem: Consults Goal: Respiratory Problems Patient Education See Patient Education Module for education specifics.  Outcome: Completed/Met Date Met:  04/08/14 Goal: Skin Care Protocol Initiated - if Braden Score 18 or less If consults are not indicated, leave blank or document N/A  Outcome: Completed/Met Date Met:  04/08/14 Goal: Nutrition Consult-if indicated Outcome: Not Applicable Date Met:  91/91/66 Goal: Diabetes Guidelines if Diabetic/Glucose > 140 If diabetic or lab glucose is > 140 mg/dl - Initiate Diabetes/Hyperglycemia Guidelines & Document Interventions  Outcome: Completed/Met Date Met:  04/08/14 MD made aware of increased CBGs. Sliding scale and CM diet added.  Problem: ICU Phase Progression Outcomes Goal: O2 sats trending toward baseline Outcome: Progressing Goal: Dyspnea controlled at rest Outcome: Completed/Met Date Met:  04/08/14 Goal: Hemodynamically stable Outcome: Completed/Met Date Met:  04/08/14 Goal: Pain controlled with appropriate interventions Outcome: Completed/Met Date Met:  04/08/14 Goal: Initial discharge plan identified Outcome: Progressing Goal: Other ICU Phase Outcomes/Goals Outcome: Not Applicable Date Met:  06/00/45
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
## 2014-04-08 NOTE — Care Management Note (Addendum)
    Page 1 of 2   04/15/2014     1:29:21 PM CARE MANAGEMENT NOTE 04/15/2014  Patient:  Kristin Wells, Kristin Wells   Account Number:  [id number]  Date Initiated:  04/08/2014  Documentation initiated by:  Jolene Provost  Subjective/Objective Assessment:   Pt is from home. She lives in the [facility] app[facility]. She has an aid that is funded through [employer] that comes for a couple hours 5 days a week. She has home O2 through [facility] and has a rollator.     Action/Plan:   Plans are for pt to discharge home. Will continue to follow for CM needs.   Anticipated DC Date:  04/10/2014   Anticipated DC Plan:  [place]  CM consult      [facility] Choice  HOME HEALTH   Choice offered to / List presented to:  C-1 Patient        [place] arranged  HH-1 RN  HH-1 RN  IV Antibiotics      [place].   Status of service:  Completed, signed off Medicare Important Message given?  YES (If response is "NO", the following Medicare IM given date fields will be blank) Date Medicare IM given:  04/15/2014 Medicare IM given by:  Jolene Provost Date Additional Medicare IM given:   Additional Medicare IM given by:    Discharge Disposition:  [place]  Per UR Regulation:    If discussed at Long Length of Stay Meetings, dates discussed:   04/15/2014    Comments:  04/15/2014 [place], RN, MSN, [facility] Pt is being discharged home following insertion of PICC line. Pt is active with [facility] and will have [place] RN through [facility] and plans on getting IV abx for 4-6 weeks. Romualdo Bolk, of [facility], met with pt and feels it would be appropriate for pt to learn to administer abx herself. Spoke with pt's Adult nurse and confirmed pt's appartment is still available (at pt's request) since pt is now late on paying rent due to hospitalization). No further CM needs identified at this time.  04/09/2014 [place], RN, MSN, [place] [facility], per pt's choice, notified of [place] orders. Vaughan Basta will obtain pt's information from pt's chart. Janalyn Shy with [facility] contacted to see if resumption of serivces would be appriate at [place]. (Pt has recently been discharged from [facility] serivces.) Will continue to follow.  04/08/2014 [place], RN, MSN, [employer]

## 2014-04-08 NOTE — Progress Notes (Signed)
Subjective: She was admitted yesterday with COPD exacerbation. She says she feels better but is still coughing and congested.  Objective: Vital signs in last 24 hours: Temp:  [97.8 F (36.6 C)-98.8 F (37.1 C)] 98.3 F (36.8 C) (12/01 0617) Pulse Rate:  [90-119] 108 (12/01 0817) Resp:  [18-24] 18 (12/01 0817) BP: (103-159)/(70-93) 125/76 mmHg (12/01 0617) SpO2:  [86 %-98 %] 92 % (12/01 0817) Weight:  [58.968 kg (130 lb)-63.413 kg (139 lb 12.8 oz)] 63.413 kg (139 lb 12.8 oz) (11/30 2310) Weight change:  Last BM Date: 03/31/14 (per patient)  Intake/Output from previous day: 11/30 0701 - 12/01 0700 In: 934.2 [I.V.:934.2] Out: 350 [Urine:350]  PHYSICAL EXAM General appearance: alert, cooperative and mild distress Resp: rhonchi bilaterally Cardio: regular rate and rhythm, S1, S2 normal, no murmur, click, rub or gallop GI: soft, non-tender; bowel sounds normal; no masses,  no organomegaly Extremities: extremities normal, atraumatic, no cyanosis or edema  Lab Results:  Results for orders placed or performed during the hospital encounter of 04/07/14 (from the past 48 hour(s))  CBC with Differential     Status: Abnormal   Collection Time: 04/07/14  7:33 PM  Result Value Ref Range   WBC 8.3 4.0 - 10.5 K/uL   RBC 5.40 (H) 3.87 - 5.11 MIL/uL   Hemoglobin 15.5 (H) 12.0 - 15.0 g/dL   HCT 46.9 (H) 36.0 - 46.0 %   MCV 86.9 78.0 - 100.0 fL   MCH 28.7 26.0 - 34.0 pg   MCHC 33.0 30.0 - 36.0 g/dL   RDW 14.6 11.5 - 15.5 %   Platelets 237 150 - 400 K/uL   Neutrophils Relative % 45 43 - 77 %   Neutro Abs 3.8 1.7 - 7.7 K/uL   Lymphocytes Relative 37 12 - 46 %   Lymphs Abs 3.1 0.7 - 4.0 K/uL   Monocytes Relative 6 3 - 12 %   Monocytes Absolute 0.5 0.1 - 1.0 K/uL   Eosinophils Relative 11 (H) 0 - 5 %   Eosinophils Absolute 0.9 (H) 0.0 - 0.7 K/uL   Basophils Relative 1 0 - 1 %   Basophils Absolute 0.0 0.0 - 0.1 K/uL  Basic metabolic panel     Status: Abnormal   Collection Time: 04/07/14   7:45 PM  Result Value Ref Range   Sodium 143 137 - 147 mEq/L   Potassium 3.9 3.7 - 5.3 mEq/L   Chloride 100 96 - 112 mEq/L   CO2 30 19 - 32 mEq/L   Glucose, Bld 94 70 - 99 mg/dL   BUN 8 6 - 23 mg/dL   Creatinine, Ser 1.08 0.50 - 1.10 mg/dL   Calcium 9.8 8.4 - 10.5 mg/dL   GFR calc non Af Amer 54 (L) >90 mL/min   GFR calc Af Amer 62 (L) >90 mL/min    Comment: (NOTE) The eGFR has been calculated using the CKD EPI equation. This calculation has not been validated in all clinical situations. eGFR's persistently <90 mL/min signify possible Chronic Kidney Disease.    Anion gap 13 5 - 15  Glucose, capillary     Status: Abnormal   Collection Time: 04/07/14 10:53 PM  Result Value Ref Range   Glucose-Capillary 132 (H) 70 - 99 mg/dL   Comment 1 Documented in Chart    Comment 2 Notify RN   CBC WITH DIFFERENTIAL     Status: Abnormal   Collection Time: 04/08/14  4:44 AM  Result Value Ref Range   WBC 4.9 4.0 -  10.5 K/uL   RBC 4.74 3.87 - 5.11 MIL/uL   Hemoglobin 13.4 12.0 - 15.0 g/dL   HCT 40.8 36.0 - 46.0 %   MCV 86.1 78.0 - 100.0 fL   MCH 28.3 26.0 - 34.0 pg   MCHC 32.8 30.0 - 36.0 g/dL   RDW 14.2 11.5 - 15.5 %   Platelets 213 150 - 400 K/uL   Neutrophils Relative % 86 (H) 43 - 77 %   Neutro Abs 4.2 1.7 - 7.7 K/uL   Lymphocytes Relative 13 12 - 46 %   Lymphs Abs 0.6 (L) 0.7 - 4.0 K/uL   Monocytes Relative 1 (L) 3 - 12 %   Monocytes Absolute 0.1 0.1 - 1.0 K/uL   Eosinophils Relative 0 0 - 5 %   Eosinophils Absolute 0.0 0.0 - 0.7 K/uL   Basophils Relative 0 0 - 1 %   Basophils Absolute 0.0 0.0 - 0.1 K/uL  Comprehensive metabolic panel     Status: Abnormal   Collection Time: 04/08/14  4:44 AM  Result Value Ref Range   Sodium 141 137 - 147 mEq/L   Potassium 4.4 3.7 - 5.3 mEq/L   Chloride 105 96 - 112 mEq/L   CO2 25 19 - 32 mEq/L   Glucose, Bld 202 (H) 70 - 99 mg/dL   BUN 9 6 - 23 mg/dL   Creatinine, Ser 0.94 0.50 - 1.10 mg/dL   Calcium 8.9 8.4 - 10.5 mg/dL   Total Protein 6.3  6.0 - 8.3 g/dL   Albumin 3.2 (L) 3.5 - 5.2 g/dL   AST 6 0 - 37 U/L   ALT 8 0 - 35 U/L   Alkaline Phosphatase 82 39 - 117 U/L   Total Bilirubin 0.2 (L) 0.3 - 1.2 mg/dL   GFR calc non Af Amer 64 (L) >90 mL/min   GFR calc Af Amer 74 (L) >90 mL/min    Comment: (NOTE) The eGFR has been calculated using the CKD EPI equation. This calculation has not been validated in all clinical situations. eGFR's persistently <90 mL/min signify possible Chronic Kidney Disease.    Anion gap 11 5 - 15  Glucose, capillary     Status: Abnormal   Collection Time: 04/08/14  7:13 AM  Result Value Ref Range   Glucose-Capillary 165 (H) 70 - 99 mg/dL   Comment 1 Notify RN    Comment 2 Documented in Chart     ABGS No results for input(s): PHART, PO2ART, TCO2, HCO3 in the last 72 hours.  Invalid input(s): PCO2 CULTURES No results found for this or any previous visit (from the past 240 hour(s)). Studies/Results: Dg Chest 2 View  04/07/2014   CLINICAL DATA:  Shortness of breath.  EXAM: CHEST  2 VIEW  COMPARISON:  01/29/2013.  FINDINGS: Mediastinum and hilar structures are normal. Lingular infiltrate is present. Possible right middle lobe infiltrate. No pleural effusion or pneumothorax. Degenerative changes thoracic spine.  IMPRESSION: Mild lingular and possibly right middle lobe pulmonary infiltrate consistent with pneumonia.   Electronically Signed   By: [place]   On: 04/07/2014 17:10    Medications:  Prior to Admission:  Prescriptions prior to admission  Medication Sig Dispense Refill Last Dose  . albuterol (PROVENTIL HFA;VENTOLIN HFA) 108 (90 BASE) MCG/ACT inhaler Inhale 2 puffs into the lungs every 6 (six) hours as needed for wheezing or shortness of breath.   04/07/2014 at Unknown time  . albuterol (PROVENTIL) (2.5 MG/3ML) 0.083% nebulizer solution Take 2.5 mg by nebulization 4 (  four) times daily.    04/07/2014 at Unknown time  . Black Cohosh 540 MG CAPS Take 1 capsule by mouth daily.    04/07/2014 at Unknown time  . busPIRone (BUSPAR) 10 MG tablet Take 10 mg by mouth 3 (three) times daily.   04/06/2014 at Unknown time  . cetirizine (ZYRTEC) 10 MG tablet Take 10 mg by mouth daily as needed for allergies.   Past Week at Unknown time  . cyclobenzaprine (FLEXERIL) 10 MG tablet Take 10 mg by mouth 2 (two) times daily.   04/07/2014 at Unknown time  . furosemide (LASIX) 20 MG tablet Take 1 tablet (20 mg total) by mouth daily. 30 tablet 6 04/06/2014 at Unknown time  . gabapentin (NEURONTIN) 300 MG capsule Take 300 mg by mouth 3 (three) times daily.    04/06/2014 at Unknown time  . HYDROcodone-acetaminophen (NORCO/VICODIN) 5-325 MG per tablet Take 1 tablet by mouth 2 (two) times daily.   Past Week at Unknown time  . Lactobacillus (ACIDOPHILUS PROBIOTIC) 100 MG CAPS Take 1 capsule by mouth daily.   04/06/2014 at Unknown time  . levothyroxine (SYNTHROID, LEVOTHROID) 25 MCG tablet Take 25 mcg by mouth daily.   04/07/2014 at Unknown time  . metFORMIN (GLUCOPHAGE) 500 MG tablet Take 500 mg by mouth 2 (two) times daily with a meal.    04/06/2014 at Unknown time  . pantoprazole (PROTONIX) 40 MG tablet Take 40 mg by mouth daily.   04/06/2014 at Unknown time  . PARoxetine (PAXIL) 30 MG tablet Take 1 tablet (30 mg total) by mouth every morning. 30 tablet 1 04/06/2014 at Unknown time  . polyethylene glycol powder (GLYCOLAX/MIRALAX) powder Take 17 g by mouth daily. (Patient taking differently: Take 17 g by mouth daily as needed for mild constipation or moderate constipation. ) 255 g 1 unknown  . rosuvastatin (CRESTOR) 10 MG tablet Take 10 mg by mouth daily at 6 PM.   04/06/2014 at Unknown time  . tiotropium (SPIRIVA) 18 MCG inhalation capsule Place 1 capsule (18 mcg total) into inhaler and inhale daily. 30 capsule 1 Past Week at Unknown time  . traZODone (DESYREL) 50 MG tablet Take 25-50 mg by mouth at bedtime as needed for sleep.    Past Month at Unknown time  . Vitamin D, Ergocalciferol, (DRISDOL)  50000 UNITS CAPS capsule Take 50,000 Units by mouth every 7 (seven) days.   04/07/2014 at Unknown time  . acidophilus (RISAQUAD) CAPS Take 1 capsule by mouth daily. (Patient not taking: Reported on 04/07/2014) 30 capsule 5 08/30/2013 at Unknown time  . busPIRone (BUSPAR) 5 MG tablet Take 5 mg by mouth 3 (three) times daily.   08/29/2013 at Unknown time  . Fluticasone-Salmeterol (ADVAIR) 250-50 MCG/DOSE AEPB Inhale 1 puff into the lungs every 12 (twelve) hours. (Patient not taking: Reported on 04/07/2014) 60 each 1 08/30/2013 at Unknown time   Scheduled: . acidophilus  1 capsule Oral Daily  . busPIRone  10 mg Oral TID  . enoxaparin (LOVENOX) injection  40 mg Subcutaneous Q24H  . gabapentin  300 mg Oral TID  . guaiFENesin  1,200 mg Oral BID  . HYDROcodone-acetaminophen  1 tablet Oral BID  . insulin aspart  0-20 Units Subcutaneous TID WC  . insulin aspart  0-5 Units Subcutaneous QHS  . ipratropium-albuterol  3 mL Nebulization Q4H  . levofloxacin (LEVAQUIN) IV  750 mg Intravenous Q24H  . levothyroxine  25 mcg Oral QAC breakfast  . loratadine  10 mg Oral Daily  . methylPREDNISolone (SOLU-MEDROL) injection  125 mg Intravenous Q12H  . nicotine  14 mg Transdermal Daily  . pantoprazole  40 mg Oral Daily  . PARoxetine  30 mg Oral Daily  . pneumococcal 23 valent vaccine  0.5 mL Intramuscular Tomorrow-1000  . polyethylene glycol  17 g Oral Daily  . rosuvastatin  10 mg Oral q1800  . tiotropium  18 mcg Inhalation Daily  . [START ON 04/14/2014] Vitamin D (Ergocalciferol)  50,000 Units Oral Q7 days   Continuous: . sodium chloride 50 mL/hr at 04/07/14 2006  . sodium chloride 75 mL/hr at 04/08/14 0012   FBP:[id number], traZODone  Assesment: She was admitted with acute respiratory failure with hypoxia. She has COPD with exacerbation. She has improved but is not ready for discharge Active Problems:   Acute respiratory failure with hypoxia    Plan: Continue current treatments. I have added  flutter valve.    LOS: 1 day   Machel Violante L 04/08/2014, 8:52 AM

## 2014-04-08 NOTE — Progress Notes (Signed)
ANTIBIOTIC CONSULT NOTE - INITIAL  Pharmacy Consult for Renal dose adjustment of abx Indication: COPD exacerbation, pt on Levaquin  No Known Allergies  Patient Measurements: Height: 5\' 1"  (154.9 cm) Weight: 139 lb 12.8 oz (63.413 kg) IBW/kg (Calculated) : 47.8  Vital Signs: Temp: 98.3 F (36.8 C) (12/01 0617) Temp Source: Oral (12/01 0617) BP: 125/76 mmHg (12/01 0617) Pulse Rate: 108 (12/01 0817) Intake/Output from previous day: 11/30 0701 - 12/01 0700 In: 934.2 [I.V.:934.2] Out: 350 [Urine:350] Intake/Output from this shift:    Labs:  Recent Labs  04/07/14 1933 04/07/14 1945 04/08/14 0444  WBC 8.3  --  4.9  HGB 15.5*  --  13.4  PLT 237  --  213  CREATININE  --  1.08 0.94   Estimated Creatinine Clearance: 52.9 mL/min (by C-G formula based on Cr of 0.94). No results for input(s): VANCOTROUGH, VANCOPEAK, VANCORANDOM, GENTTROUGH, GENTPEAK, GENTRANDOM, TOBRATROUGH, TOBRAPEAK, TOBRARND, AMIKACINPEAK, AMIKACINTROU, AMIKACIN in the last 72 hours.   Microbiology: No results found for this or any previous visit (from the past 720 hour(s)).  Medical History: Past Medical History  Diagnosis Date  . COPD (chronic obstructive pulmonary disease)   . Asthma   . Bronchitis   . Anxiety   . Back pain   . Demand ischemia of myocardium 06/28/2011  . Thyroid enlargement 06/28/2011  . MI, acute, non ST segment elevation 06/29/2011    Myoview stress test revealed mild perfusion defect  . Ischemic cardiomyopathy 06/28/2011    EF 25%. Myoview stress test later showed ejection fraction of 65%  . PNA (pneumonia) 06/29/2011  . Acute respiratory failure 06/2011    Vent dependent sec to COPD/PNA  . Hypertension   . Coronary artery disease   . Myocardial infarction   . Pneumonia   . Diabetes mellitus without complication   . Arthritis   . Rectocele 10/09/2012  . CHF (congestive heart failure)    Anti-infectives    Start     Dose/Rate Route Frequency Ordered Stop   04/07/14 2145   levofloxacin (LEVAQUIN) IVPB 750 mg     750 mg100 mL/hr over 90 Minutes Intravenous Every 24 hours 04/07/14 2137 04/12/14 2144   04/07/14 1945  levofloxacin (LEVAQUIN) IVPB 750 mg     750 mg100 mL/hr over 90 Minutes Intravenous  Once 04/07/14 1935 04/07/14 2146     Assessment: 63yo female with COPD exacerbation.  SCr at baseline.  Normalized clcr > 50.  Goal of Therapy:  Eradicate infection.  Plan:  Levaquin 750mg  IV q24hrs Switch to PO when improved / appropriate Monitor labs, progress, cultures, and LOT  [place] Crane, Dustine Bertini A 04/08/2014,10:55 AM

## 2014-04-08 NOTE — Progress Notes (Signed)
Nutrition Brief Note  Patient identified on the Malnutrition Screening Tool (MST) Report  Wt Readings from Last 15 Encounters:  04/07/14 139 lb 12.8 oz (63.413 kg)  08/30/13 140 lb (63.504 kg)  02/26/13 150 lb (68.04 kg)  02/01/13 160 lb 0.9 oz (72.6 kg)  11/15/12 140 lb (63.504 kg)  10/09/12 150 lb (68.04 kg)  10/05/12 156 lb 1.6 oz (70.806 kg)  08/13/12 141 lb (63.957 kg)  08/02/12 149 lb 7.6 oz (67.8 kg)  02/24/12 143 lb 15.4 oz (65.3 kg)  08/18/11 146 lb (66.225 kg)  08/01/11 143 lb 15.4 oz (65.3 kg)  07/28/11 136 lb (61.689 kg)  07/13/11 147 lb 4.3 oz (66.8 kg)  07/04/11 146 lb 6.2 oz (66.4 kg)   This is a 63 y.o. year old female with significant past medical history of COPD w/ nighttime O2 use, type 2 DM, CAD, systolic CHF, tobacco abuse presenting with acute resp failure w/ hypoxia, CAP, COPD, tobacco abuse presenting with acute resp failure w/ hypoxia, CAP, COPD, tobacco abuse. Patient reports persistent cough, increased work of breathing, wheezing over the past 2-3 weeks. Denies any fevers or chills. No chest pain. Still smoking up to one half pack per day. Uses nighttime oxygen. States she's been using a night as well to help with increased work of breathing. Increased inhaler use.   Chart review reveals UBW around 140#. No intake data currently available.   Body mass index is 26.43 kg/(m^2). Patient meets criteria for overweight based on current BMI.   Current diet order is Carb Modified, patient is consuming approximately n/a% of meals at this time. Labs and medications reviewed.   No nutrition interventions warranted at this time. If nutrition issues arise, please consult RD.   Chrystal Zeimet A. Jimmye Norman, RD, LDN Pager: [phone]

## 2014-04-08 NOTE — Progress Notes (Signed)
CRITICAL VALUE ALERT  Critical value received:  Blood cultures + for gram positive cocci in clusters  Date of notification:  04/08/2014  Time of notification:  12:03  Critical value read back:Yes.    Nurse who received alert:  Cecilie Kicks, RN  MD notified (1st page):  Dr. Luan Pulling  Time of first page:  12:08  Responding MD:  Arbie Cookey (secretary) for Dr. Luan Pulling  Time MD responded:  12:08

## 2014-04-08 NOTE — Progress Notes (Signed)
ANTIBIOTIC CONSULT NOTE - INITIAL  Pharmacy Consult for Vancomycin Indication: bacteremia  No Known Allergies  Patient Measurements: Height: 5\' 1"  (154.9 cm) Weight: 139 lb 12.8 oz (63.413 kg) IBW/kg (Calculated) : 47.8  Vital Signs: Temp: 98.3 F (36.8 C) (12/01 0617) Temp Source: Oral (12/01 0617) BP: 125/76 mmHg (12/01 0617) Pulse Rate: 108 (12/01 0817) Intake/Output from previous day: 11/30 0701 - 12/01 0700 In: 934.2 [I.V.:934.2] Out: 350 [Urine:350] Intake/Output from this shift:    Labs:  Recent Labs  04/07/14 1933 04/07/14 1945 04/08/14 0444  WBC 8.3  --  4.9  HGB 15.5*  --  13.4  PLT 237  --  213  CREATININE  --  1.08 0.94   Estimated Creatinine Clearance: 52.9 mL/min (by C-G formula based on Cr of 0.94). No results for input(s): VANCOTROUGH, VANCOPEAK, VANCORANDOM, GENTTROUGH, GENTPEAK, GENTRANDOM, TOBRATROUGH, TOBRAPEAK, TOBRARND, AMIKACINPEAK, AMIKACINTROU, AMIKACIN in the last 72 hours.   Microbiology: Recent Results (from the past 720 hour(s))  Culture, blood (routine x 2)     Status: None (Preliminary result)   Collection Time: 04/07/14  7:38 PM  Result Value Ref Range Status   Specimen Description BLOOD RIGHT ANTECUBITAL  Final   Special Requests BOTTLES DRAWN AEROBIC AND ANAEROBIC 6CC  Final   Culture NO GROWTH 1 DAY  Final   Report Status PENDING  Incomplete  Culture, blood (routine x 2)     Status: None (Preliminary result)   Collection Time: 04/07/14  7:45 PM  Result Value Ref Range Status   Specimen Description BLOOD LEFT ARM  Final   Special Requests BOTTLES DRAWN AEROBIC AND ANAEROBIC 6CC  Final   Culture   Final    GRAM POSITIVE COCCI IN CLUSTERS Gram Stain Report Called to,Read Back By and Verified With: HORINE,C. AT 1203 ON 04/08/2014 BY BAUGHAM,M. Performed at [facility]    Report Status PENDING  Incomplete   Medical History: Past Medical History  Diagnosis Date  . COPD (chronic obstructive pulmonary disease)   .  Asthma   . Bronchitis   . Anxiety   . Back pain   . Demand ischemia of myocardium 06/28/2011  . Thyroid enlargement 06/28/2011  . MI, acute, non ST segment elevation 06/29/2011    Myoview stress test revealed mild perfusion defect  . Ischemic cardiomyopathy 06/28/2011    EF 25%. Myoview stress test later showed ejection fraction of 65%  . PNA (pneumonia) 06/29/2011  . Acute respiratory failure 06/2011    Vent dependent sec to COPD/PNA  . Hypertension   . Coronary artery disease   . Myocardial infarction   . Pneumonia   . Diabetes mellitus without complication   . Arthritis   . Rectocele 10/09/2012  . CHF (congestive heart failure)    Anti-infectives    Start     Dose/Rate Route Frequency Ordered Stop   04/09/14 0300  vancomycin (VANCOCIN) IVPB 750 mg/150 ml premix     750 mg150 mL/hr over 60 Minutes Intravenous Every 12 hours 04/08/14 1329     04/08/14 1400  vancomycin (VANCOCIN) 1,250 mg in sodium chloride 0.9 % 250 mL IVPB     1,250 mg166.7 mL/hr over 90 Minutes Intravenous  Once 04/08/14 1327     04/07/14 2145  levofloxacin (LEVAQUIN) IVPB 750 mg     750 mg100 mL/hr over 90 Minutes Intravenous Every 24 hours 04/07/14 2137 04/12/14 2144   04/07/14 1945  levofloxacin (LEVAQUIN) IVPB 750 mg     750 mg100 mL/hr over 90 Minutes Intravenous  Once 04/07/14 1935 04/07/14 2146     Assessment: 63yo female with Normalized ClCr > 50.  Asked to initiate Vancomycin for bacteremia  CRITICAL VALUE ALERT  Critical value received: Blood cultures + for gram positive cocci in clusters  Date of notification: 04/08/2014  Time of notification: 12:03  Critical value read back:Yes.   Nurse who received alert: Cecilie Kicks, RN  MD notified (1st page): Dr. Luan Pulling  Time of first page: 12:08  Responding MD: Arbie Cookey Control and instrumentation engineer) for Dr. Luan Pulling  Time MD responded: 12:08     Goal of Therapy:  Vancomycin trough level 15-20 mcg/ml  Plan:  Vancomycin 1250mg  IV now x 1 dose  then Vancomycin 750mg  IV q12hrs Check trough at steady state Monitor labs, progress, cultures, renal fxn, and LOT  [place] Crane, Tashianna Broome A 04/08/2014,1:31 PM

## 2014-04-08 NOTE — Progress Notes (Signed)
Patient had an episode of dyspnea and O2 desaturation. Upon assessment, O2 sats were 81% and HR was up to 165. O2 was increased to 5L [place] and respiratory therapist notified. O2 sats continued to decreased to 74%. Patient was very anxious saying " I can't breath" and "it's not working". Non-rebreather was placed and O2 saturations improved to the 80s. RT came and gave nebulizer treatments. Dr. Luan Pulling notified of situation. One time dose of IV ativan ordered to help with anxiety. Patient's O2 is now 94% on 3L with HR in 130s. Will continue to monitor.

## 2014-04-08 NOTE — Plan of Care (Signed)
Problem: ICU Phase Progression Outcomes Goal: Dyspnea controlled at rest Outcome: Progressing Goal: Pain controlled with appropriate interventions Outcome: Progressing Goal: Flu/PneumoVaccines if indicated Outcome: Completed/Met Date Met:  04/08/14 Goal: Voiding-avoid urinary catheter unless indicated Outcome: Completed/Met Date Met:  04/08/14

## 2014-04-08 NOTE — Care Management Utilization Note (Signed)
UR complete 

## 2014-04-09 DIAGNOSIS — J189 Pneumonia, unspecified organism: Secondary | ICD-10-CM | POA: Diagnosis present

## 2014-04-09 DIAGNOSIS — J441 Chronic obstructive pulmonary disease with (acute) exacerbation: Secondary | ICD-10-CM | POA: Diagnosis present

## 2014-04-09 LAB — COMPREHENSIVE METABOLIC PANEL
ALT: 13 U/L (ref 0–35)
AST: 12 U/L (ref 0–37)
Albumin: 3.2 g/dL — ABNORMAL LOW (ref 3.5–5.2)
Alkaline Phosphatase: 74 U/L (ref 39–117)
Anion gap: 11 (ref 5–15)
BUN: 13 mg/dL (ref 6–23)
CO2: 27 mEq/L (ref 19–32)
Calcium: 9.3 mg/dL (ref 8.4–10.5)
Chloride: 105 mEq/L (ref 96–112)
Creatinine, Ser: 0.92 mg/dL (ref 0.50–1.10)
GFR calc Af Amer: 76 mL/min — ABNORMAL LOW (ref >90)
GFR calc non Af Amer: 65 mL/min — ABNORMAL LOW (ref >90)
Glucose, Bld: 206 mg/dL — ABNORMAL HIGH (ref 70–99)
Potassium: 4.5 mEq/L (ref 3.7–5.3)
Sodium: 143 mEq/L (ref 137–147)
Total Bilirubin: <0.2 mg/dL — ABNORMAL LOW (ref 0.3–1.2)
Total Protein: 6.3 g/dL (ref 6.0–8.3)

## 2014-04-09 LAB — CBC WITH DIFFERENTIAL/PLATELET
Basophils Absolute: 0 10*3/uL (ref 0.0–0.1)
Basophils Relative: 0 % (ref 0–1)
Eosinophils Absolute: 0 10*3/uL (ref 0.0–0.7)
Eosinophils Relative: 0 % (ref 0–5)
HCT: 38.9 % (ref 36.0–46.0)
Hemoglobin: 12.6 g/dL (ref 12.0–15.0)
Lymphocytes Relative: 7 % — ABNORMAL LOW (ref 12–46)
Lymphs Abs: 0.7 10*3/uL (ref 0.7–4.0)
MCH: 28.3 pg (ref 26.0–34.0)
MCHC: 32.4 g/dL (ref 30.0–36.0)
MCV: 87.4 fL (ref 78.0–100.0)
Monocytes Absolute: 0.1 10*3/uL (ref 0.1–1.0)
Monocytes Relative: 1 % — ABNORMAL LOW (ref 3–12)
Neutro Abs: 8.6 10*3/uL — ABNORMAL HIGH (ref 1.7–7.7)
Neutrophils Relative %: 92 % — ABNORMAL HIGH (ref 43–77)
Platelets: 208 10*3/uL (ref 150–400)
RBC: 4.45 MIL/uL (ref 3.87–5.11)
RDW: 14.5 % (ref 11.5–15.5)
WBC: 9.4 10*3/uL (ref 4.0–10.5)

## 2014-04-09 LAB — GLUCOSE, CAPILLARY
Glucose-Capillary: 148 mg/dL — ABNORMAL HIGH (ref 70–99)
Glucose-Capillary: 180 mg/dL — ABNORMAL HIGH (ref 70–99)
Glucose-Capillary: 167 mg/dL — ABNORMAL HIGH (ref 70–99)
Glucose-Capillary: 163 mg/dL — ABNORMAL HIGH (ref 70–99)

## 2014-04-09 LAB — LEGIONELLA ANTIGEN, URINE

## 2014-04-09 NOTE — Progress Notes (Signed)
Subjective: She had an episode of pretty severe shortness of breath yesterday but is much improved today. She says she feels well. We reviewed smoking cessation and I strongly encouraged her to continue her efforts at this.  Objective: Vital signs in last 24 hours: Temp:  [97.6 F (36.4 C)-98.5 F (36.9 C)] 98.5 F (36.9 C) (12/02 0539) Pulse Rate:  [102-117] 102 (12/02 0539) Resp:  [21-22] 22 (12/02 0539) BP: (89-117)/(71-84) 117/71 mmHg (12/02 0539) SpO2:  [92 %-97 %] 94 % (12/02 0658) Weight change:  Last BM Date: 04/08/14  Intake/Output from previous day: 12/01 0701 - 12/02 0700 In: 720 [P.O.:720] Out: 2700 [Urine:2700]  PHYSICAL EXAM General appearance: alert, cooperative and mild distress Resp: rhonchi bilaterally Cardio: regular rate and rhythm, S1, S2 normal, no murmur, click, rub or gallop GI: soft, non-tender; bowel sounds normal; no masses,  no organomegaly Extremities: extremities normal, atraumatic, no cyanosis or edema  Lab Results:  Results for orders placed or performed during the hospital encounter of 04/07/14 (from the past 48 hour(s))  CBC with Differential     Status: Abnormal   Collection Time: 04/07/14  7:33 PM  Result Value Ref Range   WBC 8.3 4.0 - 10.5 K/uL   RBC 5.40 (H) 3.87 - 5.11 MIL/uL   Hemoglobin 15.5 (H) 12.0 - 15.0 g/dL   HCT 46.9 (H) 36.0 - 46.0 %   MCV 86.9 78.0 - 100.0 fL   MCH 28.7 26.0 - 34.0 pg   MCHC 33.0 30.0 - 36.0 g/dL   RDW 14.6 11.5 - 15.5 %   Platelets 237 150 - 400 K/uL   Neutrophils Relative % 45 43 - 77 %   Neutro Abs 3.8 1.7 - 7.7 K/uL   Lymphocytes Relative 37 12 - 46 %   Lymphs Abs 3.1 0.7 - 4.0 K/uL   Monocytes Relative 6 3 - 12 %   Monocytes Absolute 0.5 0.1 - 1.0 K/uL   Eosinophils Relative 11 (H) 0 - 5 %   Eosinophils Absolute 0.9 (H) 0.0 - 0.7 K/uL   Basophils Relative 1 0 - 1 %   Basophils Absolute 0.0 0.0 - 0.1 K/uL  Culture, blood (routine x 2)     Status: None (Preliminary result)   Collection Time:  04/07/14  7:38 PM  Result Value Ref Range   Specimen Description BLOOD RIGHT ANTECUBITAL    Special Requests BOTTLES DRAWN AEROBIC AND ANAEROBIC 6CC    Culture NO GROWTH 1 DAY    Report Status PENDING   Basic metabolic panel     Status: Abnormal   Collection Time: 04/07/14  7:45 PM  Result Value Ref Range   Sodium 143 137 - 147 mEq/L   Potassium 3.9 3.7 - 5.3 mEq/L   Chloride 100 96 - 112 mEq/L   CO2 30 19 - 32 mEq/L   Glucose, Bld 94 70 - 99 mg/dL   BUN 8 6 - 23 mg/dL   Creatinine, Ser 1.08 0.50 - 1.10 mg/dL   Calcium 9.8 8.4 - 10.5 mg/dL   GFR calc non Af Amer 54 (L) >90 mL/min   GFR calc Af Amer 62 (L) >90 mL/min    Comment: (NOTE) The eGFR has been calculated using the CKD EPI equation. This calculation has not been validated in all clinical situations. eGFR's persistently <90 mL/min signify possible Chronic Kidney Disease.    Anion gap 13 5 - 15  Culture, blood (routine x 2)     Status: None (Preliminary result)   Collection  Time: 04/07/14  7:45 PM  Result Value Ref Range   Specimen Description BLOOD LEFT ARM    Special Requests BOTTLES DRAWN AEROBIC AND ANAEROBIC 6CC    Culture  Setup Time      04/08/2014 23:56 Performed at [facility] IN [place] Note: Gram Stain Report Called to,Read Back By and Verified With: HORINE C AT 1203 ON 04/08/2014 BY BAUGHAM M. Performed at [facility] Performed at [facility]    Report Status PENDING   HIV antibody     Status: None   Collection Time: 04/07/14  9:24 PM  Result Value Ref Range   HIV 1&2 Ab, 4th Generation NONREACTIVE NONREACTIVE    Comment: (NOTE) A NONREACTIVE HIV Ag/Ab result does not exclude HIV infection since the time frame for seroconversion is variable. If acute HIV infection is suspected, a HIV-1 RNA Qualitative TMA test is recommended. HIV-1/2 Antibody Diff         Not indicated. HIV-1 RNA, Qual TMA           Not indicated. PLEASE NOTE: This  information has been disclosed to you from records whose confidentiality may be protected by state law. If your state requires such protection, then the state law prohibits you from making any further disclosure of the information without the specific written consent of the person to whom it pertains, or as otherwise permitted by law. A general authorization for the release of medical or other information is NOT sufficient for this purpose. The performance of this assay has not been clinically validated in patients less than 49 years old. Performed at [facility]   Hemoglobin A1c     Status: Abnormal   Collection Time: 04/07/14  9:40 PM  Result Value Ref Range   Hgb A1c MFr Bld 6.4 (H) <5.7 %    Comment: (NOTE)                                                                       According to the ADA Clinical Practice Recommendations for 2011, when HbA1c is used as a screening test:  >=6.5%   Diagnostic of Diabetes Mellitus           (if abnormal result is confirmed) 5.7-6.4%   Increased risk of developing Diabetes Mellitus References:Diagnosis and Classification of Diabetes Mellitus,Diabetes JHER,7408,14(GYJEH 1):S62-S69 and Standards of Medical Care in         Diabetes - 2011,Diabetes UDJS,9702,63 (Suppl 1):S11-S61.    Mean Plasma Glucose 137 (H) <117 mg/dL    Comment: Performed at [facility]  Glucose, capillary     Status: Abnormal   Collection Time: 04/07/14 10:53 PM  Result Value Ref Range   Glucose-Capillary 132 (H) 70 - 99 mg/dL   Comment 1 Documented in Chart    Comment 2 Notify RN   Legionella antigen, urine     Status: None (Preliminary result)   Collection Time: 04/07/14 11:02 PM  Result Value Ref Range   Specimen Description URINE, RANDOM    Special Requests NONE    Legionella Antigen, Urine      Negative for Legionella pneumophila serogroup 1  Legionella pneumophila serogroup 1 antigen  can be detected in urine within 2 to 3 days of infection and may persist even after treatment. This  assay does not detect other Legionella species or serogroups. Performed at [facility]    Report Status PENDING   Strep pneumoniae urinary antigen     Status: None   Collection Time: 04/07/14 11:02 PM  Result Value Ref Range   Strep Pneumo Urinary Antigen NEGATIVE NEGATIVE    Comment:        Infection due to S. pneumoniae cannot be absolutely ruled out since the antigen present may be below the detection limit of the test. Performed at [facility] Performed at [place]     Status: Abnormal   Collection Time: 04/08/14  4:44 AM  Result Value Ref Range   WBC 4.9 4.0 - 10.5 K/uL   RBC 4.74 3.87 - 5.11 MIL/uL   Hemoglobin 13.4 12.0 - 15.0 g/dL   HCT 40.8 36.0 - 46.0 %   MCV 86.1 78.0 - 100.0 fL   MCH 28.3 26.0 - 34.0 pg   MCHC 32.8 30.0 - 36.0 g/dL   RDW 14.2 11.5 - 15.5 %   Platelets 213 150 - 400 K/uL   Neutrophils Relative % 86 (H) 43 - 77 %   Neutro Abs 4.2 1.7 - 7.7 K/uL   Lymphocytes Relative 13 12 - 46 %   Lymphs Abs 0.6 (L) 0.7 - 4.0 K/uL   Monocytes Relative 1 (L) 3 - 12 %   Monocytes Absolute 0.1 0.1 - 1.0 K/uL   Eosinophils Relative 0 0 - 5 %   Eosinophils Absolute 0.0 0.0 - 0.7 K/uL   Basophils Relative 0 0 - 1 %   Basophils Absolute 0.0 0.0 - 0.1 K/uL  Comprehensive metabolic panel     Status: Abnormal   Collection Time: 04/08/14  4:44 AM  Result Value Ref Range   Sodium 141 137 - 147 mEq/L   Potassium 4.4 3.7 - 5.3 mEq/L   Chloride 105 96 - 112 mEq/L   CO2 25 19 - 32 mEq/L   Glucose, Bld 202 (H) 70 - 99 mg/dL   BUN 9 6 - 23 mg/dL   Creatinine, Ser 0.94 0.50 - 1.10 mg/dL   Calcium 8.9 8.4 - 10.5 mg/dL   Total Protein 6.3 6.0 - 8.3 g/dL   Albumin 3.2 (L) 3.5 - 5.2 g/dL   AST 6 0 - 37 U/L   ALT 8 0 - 35 U/L   Alkaline Phosphatase 82 39 - 117 U/L   Total Bilirubin 0.2 (L) 0.3 - 1.2 mg/dL   GFR calc non Af  Amer 64 (L) >90 mL/min   GFR calc Af Amer 74 (L) >90 mL/min    Comment: (NOTE) The eGFR has been calculated using the CKD EPI equation. This calculation has not been validated in all clinical situations. eGFR's persistently <90 mL/min signify possible Chronic Kidney Disease.    Anion gap 11 5 - 15  Glucose, capillary     Status: Abnormal   Collection Time: 04/08/14  7:13 AM  Result Value Ref Range   Glucose-Capillary 165 (H) 70 - 99 mg/dL   Comment 1 Notify RN    Comment 2 Documented in Chart   Glucose, capillary     Status: Abnormal   Collection Time: 04/08/14 11:55 AM  Result Value Ref Range   Glucose-Capillary 200 (H) 70 - 99 mg/dL  Glucose, capillary  Status: Abnormal   Collection Time: 04/08/14  4:27 PM  Result Value Ref Range   Glucose-Capillary 224 (H) 70 - 99 mg/dL   Comment 1 Notify RN   Glucose, capillary     Status: Abnormal   Collection Time: 04/08/14  9:02 PM  Result Value Ref Range   Glucose-Capillary 275 (H) 70 - 99 mg/dL  CBC WITH DIFFERENTIAL     Status: Abnormal   Collection Time: 04/09/14  5:26 AM  Result Value Ref Range   WBC 9.4 4.0 - 10.5 K/uL   RBC 4.45 3.87 - 5.11 MIL/uL   Hemoglobin 12.6 12.0 - 15.0 g/dL   HCT 38.9 36.0 - 46.0 %   MCV 87.4 78.0 - 100.0 fL   MCH 28.3 26.0 - 34.0 pg   MCHC 32.4 30.0 - 36.0 g/dL   RDW 14.5 11.5 - 15.5 %   Platelets 208 150 - 400 K/uL   Neutrophils Relative % 92 (H) 43 - 77 %   Neutro Abs 8.6 (H) 1.7 - 7.7 K/uL   Lymphocytes Relative 7 (L) 12 - 46 %   Lymphs Abs 0.7 0.7 - 4.0 K/uL   Monocytes Relative 1 (L) 3 - 12 %   Monocytes Absolute 0.1 0.1 - 1.0 K/uL   Eosinophils Relative 0 0 - 5 %   Eosinophils Absolute 0.0 0.0 - 0.7 K/uL   Basophils Relative 0 0 - 1 %   Basophils Absolute 0.0 0.0 - 0.1 K/uL  Comprehensive metabolic panel     Status: Abnormal   Collection Time: 04/09/14  5:26 AM  Result Value Ref Range   Sodium 143 137 - 147 mEq/L   Potassium 4.5 3.7 - 5.3 mEq/L   Chloride 105 96 - 112 mEq/L   CO2  27 19 - 32 mEq/L   Glucose, Bld 206 (H) 70 - 99 mg/dL   BUN 13 6 - 23 mg/dL   Creatinine, Ser 0.92 0.50 - 1.10 mg/dL   Calcium 9.3 8.4 - 10.5 mg/dL   Total Protein 6.3 6.0 - 8.3 g/dL   Albumin 3.2 (L) 3.5 - 5.2 g/dL   AST 12 0 - 37 U/L   ALT 13 0 - 35 U/L   Alkaline Phosphatase 74 39 - 117 U/L   Total Bilirubin <0.2 (L) 0.3 - 1.2 mg/dL   GFR calc non Af Amer 65 (L) >90 mL/min   GFR calc Af Amer 76 (L) >90 mL/min    Comment: (NOTE) The eGFR has been calculated using the CKD EPI equation. This calculation has not been validated in all clinical situations. eGFR's persistently <90 mL/min signify possible Chronic Kidney Disease.    Anion gap 11 5 - 15  Glucose, capillary     Status: Abnormal   Collection Time: 04/09/14  7:18 AM  Result Value Ref Range   Glucose-Capillary 148 (H) 70 - 99 mg/dL   Comment 1 Documented in Chart    Comment 2 Notify RN     ABGS No results for input(s): PHART, PO2ART, TCO2, HCO3 in the last 72 hours.  Invalid input(s): PCO2 CULTURES Recent Results (from the past 240 hour(s))  Culture, blood (routine x 2)     Status: None (Preliminary result)   Collection Time: 04/07/14  7:38 PM  Result Value Ref Range Status   Specimen Description BLOOD RIGHT ANTECUBITAL  Final   Special Requests BOTTLES DRAWN AEROBIC AND ANAEROBIC 6CC  Final   Culture NO GROWTH 1 DAY  Final   Report Status PENDING  Incomplete  Culture, blood (routine x 2)     Status: None (Preliminary result)   Collection Time: 04/07/14  7:45 PM  Result Value Ref Range Status   Specimen Description BLOOD LEFT ARM  Final   Special Requests BOTTLES DRAWN AEROBIC AND ANAEROBIC [facility]  Final   Culture  Setup Time   Final    04/08/2014 23:56 Performed at [facility]    Culture   Final    GRAM POSITIVE COCCI IN [facility] Note: Gram Stain Report Called to,Read Back By and Verified With: HORINE C AT 1203 ON 04/08/2014 BY BAUGHAM M. Performed at [facility] Performed at [facility]    Report Status PENDING  Incomplete   Studies/Results: Dg Chest 2 View  04/07/2014   CLINICAL DATA:  Shortness of breath.  EXAM: CHEST  2 VIEW  COMPARISON:  01/29/2013.  FINDINGS: Mediastinum and hilar structures are normal. Lingular infiltrate is present. Possible right middle lobe infiltrate. No pleural effusion or pneumothorax. Degenerative changes thoracic spine.  IMPRESSION: Mild lingular and possibly right middle lobe pulmonary infiltrate consistent with pneumonia.   Electronically Signed   By: Sula   On: 04/07/2014 17:10    Medications:  Prior to Admission:  Prescriptions prior to admission  Medication Sig Dispense Refill Last Dose  . albuterol (PROVENTIL HFA;VENTOLIN HFA) 108 (90 BASE) MCG/ACT inhaler Inhale 2 puffs into the lungs every 6 (six) hours as needed for wheezing or shortness of breath.   04/07/2014 at Unknown time  . albuterol (PROVENTIL) (2.5 MG/3ML) 0.083% nebulizer solution Take 2.5 mg by nebulization 4 (four) times daily.    04/07/2014 at Unknown time  . Black Cohosh 540 MG CAPS Take 1 capsule by mouth daily.   04/07/2014 at Unknown time  . busPIRone (BUSPAR) 10 MG tablet Take 10 mg by mouth 3 (three) times daily.   04/06/2014 at Unknown time  . cetirizine (ZYRTEC) 10 MG tablet Take 10 mg by mouth daily as needed for allergies.   Past Week at Unknown time  . cyclobenzaprine (FLEXERIL) 10 MG tablet Take 10 mg by mouth 2 (two) times daily.   04/07/2014 at Unknown time  . furosemide (LASIX) 20 MG tablet Take 1 tablet (20 mg total) by mouth daily. 30 tablet 6 04/06/2014 at Unknown time  . gabapentin (NEURONTIN) 300 MG capsule Take 300 mg by mouth 3 (three) times daily.    04/06/2014 at Unknown time  . HYDROcodone-acetaminophen (NORCO/VICODIN) 5-325 MG per tablet Take 1 tablet by mouth 2 (two) times daily.   Past Week at Unknown time  . Lactobacillus (ACIDOPHILUS PROBIOTIC) 100 MG CAPS Take 1 capsule by mouth daily.   04/06/2014 at Unknown time  .  levothyroxine (SYNTHROID, LEVOTHROID) 25 MCG tablet Take 25 mcg by mouth daily.   04/07/2014 at Unknown time  . metFORMIN (GLUCOPHAGE) 500 MG tablet Take 500 mg by mouth 2 (two) times daily with a meal.    04/06/2014 at Unknown time  . pantoprazole (PROTONIX) 40 MG tablet Take 40 mg by mouth daily.   04/06/2014 at Unknown time  . PARoxetine (PAXIL) 30 MG tablet Take 1 tablet (30 mg total) by mouth every morning. 30 tablet 1 04/06/2014 at Unknown time  . polyethylene glycol powder (GLYCOLAX/MIRALAX) powder Take 17 g by mouth daily. (Patient taking differently: Take 17 g by mouth daily as needed for mild constipation or moderate constipation. ) 255 g 1 unknown  . rosuvastatin (CRESTOR) 10 MG tablet Take 10 mg by mouth daily at 6  PM.   04/06/2014 at Unknown time  . tiotropium (SPIRIVA) 18 MCG inhalation capsule Place 1 capsule (18 mcg total) into inhaler and inhale daily. 30 capsule 1 Past Week at Unknown time  . traZODone (DESYREL) 50 MG tablet Take 25-50 mg by mouth at bedtime as needed for sleep.    Past Month at Unknown time  . Vitamin D, Ergocalciferol, (DRISDOL) 50000 UNITS CAPS capsule Take 50,000 Units by mouth every 7 (seven) days.   04/07/2014 at Unknown time  . acidophilus (RISAQUAD) CAPS Take 1 capsule by mouth daily. (Patient not taking: Reported on 04/07/2014) 30 capsule 5 08/30/2013 at Unknown time  . busPIRone (BUSPAR) 5 MG tablet Take 5 mg by mouth 3 (three) times daily.   08/29/2013 at Unknown time  . Fluticasone-Salmeterol (ADVAIR) 250-50 MCG/DOSE AEPB Inhale 1 puff into the lungs every 12 (twelve) hours. (Patient not taking: Reported on 04/07/2014) 60 each 1 08/30/2013 at Unknown time   Scheduled: . acidophilus  1 capsule Oral Daily  . busPIRone  10 mg Oral TID  . enoxaparin (LOVENOX) injection  40 mg Subcutaneous Q24H  . gabapentin  300 mg Oral TID  . guaiFENesin  1,200 mg Oral BID  . HYDROcodone-acetaminophen  1 tablet Oral BID  . insulin aspart  0-20 Units Subcutaneous TID WC  .  insulin aspart  0-5 Units Subcutaneous QHS  . ipratropium-albuterol  3 mL Nebulization Q4H  . levofloxacin (LEVAQUIN) IV  750 mg Intravenous Q24H  . levothyroxine  25 mcg Oral QAC breakfast  . loratadine  10 mg Oral Daily  . methylPREDNISolone (SOLU-MEDROL) injection  125 mg Intravenous Q12H  . nicotine  14 mg Transdermal Daily  . pantoprazole  40 mg Oral Daily  . PARoxetine  30 mg Oral Daily  . pneumococcal 23 valent vaccine  0.5 mL Intramuscular Tomorrow-1000  . polyethylene glycol  17 g Oral Daily  . rosuvastatin  10 mg Oral q1800  . tiotropium  18 mcg Inhalation Daily  . vancomycin  750 mg Intravenous Q12H  . [START ON 04/14/2014] Vitamin D (Ergocalciferol)  50,000 Units Oral Q7 days   Continuous: . sodium chloride 50 mL/hr at 04/07/14 2006  . sodium chloride 75 mL/hr at 04/08/14 1706   LKT:GYBWLSLHT, ipratropium-albuterol, LORazepam, traZODone  Assesment: She was admitted with acute respiratory failure with hypoxia. She has COPD with exacerbation and pneumonia. She is improved. She's had significant trouble with anxiety and depression but seems a little better now. She is attempting to stop smoking. She has diabetes which is stable. She has ischemic cardiomyopathy and chronic systolic heart failure. Her heart failure does not seem to be playing a role in her shortness of breath now. Active Problems:   Ischemic cardiomyopathy   HTN (hypertension)   Anxiety disorder   CAD (coronary artery disease)   Systolic CHF, chronic   Diabetes   Acute respiratory failure with hypoxia    Plan: I don't think she is ready for discharge at this point. She has a positive blood culture but we don't have identification of the organism as yet. It looks like it may be staph based on the Gram stain so she is on vancomycin. She may be ready for discharge in the next 48 hours    LOS: 2 days   Roselene Gray L 04/09/2014, 8:36 AM

## 2014-04-09 NOTE — Progress Notes (Signed)
Inpatient Diabetes Program Recommendations  AACE/ADA: New Consensus Statement on Inpatient Glycemic Control (2013)  Target Ranges:  Prepandial:   less than 140 mg/dL      Peak postprandial:   less than 180 mg/dL (1-2 hours)      Critically ill patients:  140 - 180 mg/dL   Results for SANTANNA, WHITFORD (MRN [id number]) as of 04/09/2014 08:58  Ref. Range 04/08/2014 07:13 04/08/2014 11:55 04/08/2014 16:27 04/08/2014 21:02 04/09/2014 07:18  Glucose-Capillary Latest Range: 70-99 mg/dL 165 (H) 200 (H) 224 (H) 275 (H) 148 (H)   Diabetes history: DM2 Outpatient Diabetes medications: Metformin 500 mg BID  Current orders for Inpatient glycemic control: Novolog 0-20 units AC, Novolog 0-5 units HS  Inpatient Diabetes Program Recommendations Insulin - Meal Coverage: While inpatient and ordered steroids, please consider ordering Novolog 4 units TID with meals for meal coverage.  Thanks, Barnie Alderman, RN, MSN, CCRN, CDE Diabetes Coordinator Inpatient Diabetes Program [phone] (Team Pager) [phone] ([facility] office) [phone] [facility] office)

## 2014-04-10 LAB — CBC WITH DIFFERENTIAL/PLATELET
Basophils Absolute: 0 10*3/uL (ref 0.0–0.1)
Basophils Relative: 0 % (ref 0–1)
Eosinophils Absolute: 0 10*3/uL (ref 0.0–0.7)
Eosinophils Relative: 0 % (ref 0–5)
HCT: 38.3 % (ref 36.0–46.0)
Hemoglobin: 12.3 g/dL (ref 12.0–15.0)
Lymphocytes Relative: 9 % — ABNORMAL LOW (ref 12–46)
Lymphs Abs: 0.7 10*3/uL (ref 0.7–4.0)
MCH: 28.5 pg (ref 26.0–34.0)
MCHC: 32.1 g/dL (ref 30.0–36.0)
MCV: 88.7 fL (ref 78.0–100.0)
Monocytes Absolute: 0.2 10*3/uL (ref 0.1–1.0)
Monocytes Relative: 2 % — ABNORMAL LOW (ref 3–12)
Neutro Abs: 6.8 10*3/uL (ref 1.7–7.7)
Neutrophils Relative %: 89 % — ABNORMAL HIGH (ref 43–77)
Platelets: 208 10*3/uL (ref 150–400)
RBC: 4.32 MIL/uL (ref 3.87–5.11)
RDW: 14.6 % (ref 11.5–15.5)
WBC: 7.6 10*3/uL (ref 4.0–10.5)

## 2014-04-10 LAB — COMPREHENSIVE METABOLIC PANEL
ALT: 12 U/L (ref 0–35)
AST: 8 U/L (ref 0–37)
Albumin: 3.1 g/dL — ABNORMAL LOW (ref 3.5–5.2)
Alkaline Phosphatase: 65 U/L (ref 39–117)
Anion gap: 11 (ref 5–15)
BUN: 17 mg/dL (ref 6–23)
CO2: 27 mEq/L (ref 19–32)
Calcium: 8.9 mg/dL (ref 8.4–10.5)
Chloride: 105 mEq/L (ref 96–112)
Creatinine, Ser: 0.9 mg/dL (ref 0.50–1.10)
GFR calc Af Amer: 78 mL/min — ABNORMAL LOW (ref >90)
GFR calc non Af Amer: 67 mL/min — ABNORMAL LOW (ref >90)
Glucose, Bld: 187 mg/dL — ABNORMAL HIGH (ref 70–99)
Potassium: 4.7 mEq/L (ref 3.7–5.3)
Sodium: 143 mEq/L (ref 137–147)
Total Bilirubin: <0.2 mg/dL — ABNORMAL LOW (ref 0.3–1.2)
Total Protein: 6 g/dL (ref 6.0–8.3)

## 2014-04-10 LAB — GLUCOSE, CAPILLARY
Glucose-Capillary: 147 mg/dL — ABNORMAL HIGH (ref 70–99)
Glucose-Capillary: 198 mg/dL — ABNORMAL HIGH (ref 70–99)
Glucose-Capillary: 118 mg/dL — ABNORMAL HIGH (ref 70–99)
Glucose-Capillary: 250 mg/dL — ABNORMAL HIGH (ref 70–99)

## 2014-04-10 LAB — EXPECTORATED SPUTUM ASSESSMENT W GRAM STAIN, RFLX TO RESP C

## 2014-04-10 LAB — EXPECTORATED SPUTUM ASSESSMENT W REFEX TO RESP CULTURE

## 2014-04-10 NOTE — Progress Notes (Signed)
Subjective: Patient is resting. She feels better. She growing G+ cocci in her blood but identification is pending. She is on IV antibiotics.  Objective: Vital signs in last 24 hours: Temp:  [97.7 F (36.5 C)-98.4 F (36.9 C)] 98.4 F (36.9 C) (12/03 0600) Pulse Rate:  [100-102] 100 (12/03 0600) Resp:  [18] 18 (12/03 0600) BP: (98-115)/(66-70) 110/68 mmHg (12/03 0600) SpO2:  [90 %-96 %] 94 % (12/03 0728) Weight change:  Last BM Date: 04/08/14  Intake/Output from previous day: 12/02 0701 - 12/03 0700 In: 480 [P.O.:480] Out: 1200 [Urine:1200]  PHYSICAL EXAM General appearance: alert and no distress Resp: diminished breath sounds bilaterally and rhonchi bilaterally Cardio: S1, S2 normal GI: soft, non-tender; bowel sounds normal; no masses,  no organomegaly Extremities: extremities normal, atraumatic, no cyanosis or edema  Lab Results:  Results for orders placed or performed during the hospital encounter of 04/07/14 (from the past 48 hour(s))  Glucose, capillary     Status: Abnormal   Collection Time: 04/08/14 11:55 AM  Result Value Ref Range   Glucose-Capillary 200 (H) 70 - 99 mg/dL  Glucose, capillary     Status: Abnormal   Collection Time: 04/08/14  4:27 PM  Result Value Ref Range   Glucose-Capillary 224 (H) 70 - 99 mg/dL   Comment 1 Notify RN   Glucose, capillary     Status: Abnormal   Collection Time: 04/08/14  9:02 PM  Result Value Ref Range   Glucose-Capillary 275 (H) 70 - 99 mg/dL  CBC WITH DIFFERENTIAL     Status: Abnormal   Collection Time: 04/09/14  5:26 AM  Result Value Ref Range   WBC 9.4 4.0 - 10.5 K/uL   RBC 4.45 3.87 - 5.11 MIL/uL   Hemoglobin 12.6 12.0 - 15.0 g/dL   HCT 38.9 36.0 - 46.0 %   MCV 87.4 78.0 - 100.0 fL   MCH 28.3 26.0 - 34.0 pg   MCHC 32.4 30.0 - 36.0 g/dL   RDW 14.5 11.5 - 15.5 %   Platelets 208 150 - 400 K/uL   Neutrophils Relative % 92 (H) 43 - 77 %   Neutro Abs 8.6 (H) 1.7 - 7.7 K/uL   Lymphocytes Relative 7 (L) 12 - 46 %   Lymphs Abs 0.7 0.7 - 4.0 K/uL   Monocytes Relative 1 (L) 3 - 12 %   Monocytes Absolute 0.1 0.1 - 1.0 K/uL   Eosinophils Relative 0 0 - 5 %   Eosinophils Absolute 0.0 0.0 - 0.7 K/uL   Basophils Relative 0 0 - 1 %   Basophils Absolute 0.0 0.0 - 0.1 K/uL  Comprehensive metabolic panel     Status: Abnormal   Collection Time: 04/09/14  5:26 AM  Result Value Ref Range   Sodium 143 137 - 147 mEq/L   Potassium 4.5 3.7 - 5.3 mEq/L   Chloride 105 96 - 112 mEq/L   CO2 27 19 - 32 mEq/L   Glucose, Bld 206 (H) 70 - 99 mg/dL   BUN 13 6 - 23 mg/dL   Creatinine, Ser 0.92 0.50 - 1.10 mg/dL   Calcium 9.3 8.4 - 10.5 mg/dL   Total Protein 6.3 6.0 - 8.3 g/dL   Albumin 3.2 (L) 3.5 - 5.2 g/dL   AST 12 0 - 37 U/L   ALT 13 0 - 35 U/L   Alkaline Phosphatase 74 39 - 117 U/L   Total Bilirubin <0.2 (L) 0.3 - 1.2 mg/dL   GFR calc non Af Amer 65 (L) >90  mL/min   GFR calc Af Amer 76 (L) >90 mL/min    Comment: (NOTE) The eGFR has been calculated using the CKD EPI equation. This calculation has not been validated in all clinical situations. eGFR's persistently <90 mL/min signify possible Chronic Kidney Disease.    Anion gap 11 5 - 15  Glucose, capillary     Status: Abnormal   Collection Time: 04/09/14  7:18 AM  Result Value Ref Range   Glucose-Capillary 148 (H) 70 - 99 mg/dL   Comment 1 Documented in Chart    Comment 2 Notify RN   Glucose, capillary     Status: Abnormal   Collection Time: 04/09/14 11:23 AM  Result Value Ref Range   Glucose-Capillary 180 (H) 70 - 99 mg/dL   Comment 1 Documented in Chart    Comment 2 Notify RN   Glucose, capillary     Status: Abnormal   Collection Time: 04/09/14  4:14 PM  Result Value Ref Range   Glucose-Capillary 167 (H) 70 - 99 mg/dL   Comment 1 Documented in Chart    Comment 2 Notify RN   Glucose, capillary     Status: Abnormal   Collection Time: 04/09/14  8:31 PM  Result Value Ref Range   Glucose-Capillary 163 (H) 70 - 99 mg/dL   Comment 1 Notify RN   CBC WITH  DIFFERENTIAL     Status: Abnormal   Collection Time: 04/10/14  5:19 AM  Result Value Ref Range   WBC 7.6 4.0 - 10.5 K/uL   RBC 4.32 3.87 - 5.11 MIL/uL   Hemoglobin 12.3 12.0 - 15.0 g/dL   HCT 38.3 36.0 - 46.0 %   MCV 88.7 78.0 - 100.0 fL   MCH 28.5 26.0 - 34.0 pg   MCHC 32.1 30.0 - 36.0 g/dL   RDW 14.6 11.5 - 15.5 %   Platelets 208 150 - 400 K/uL   Neutrophils Relative % 89 (H) 43 - 77 %   Neutro Abs 6.8 1.7 - 7.7 K/uL   Lymphocytes Relative 9 (L) 12 - 46 %   Lymphs Abs 0.7 0.7 - 4.0 K/uL   Monocytes Relative 2 (L) 3 - 12 %   Monocytes Absolute 0.2 0.1 - 1.0 K/uL   Eosinophils Relative 0 0 - 5 %   Eosinophils Absolute 0.0 0.0 - 0.7 K/uL   Basophils Relative 0 0 - 1 %   Basophils Absolute 0.0 0.0 - 0.1 K/uL  Comprehensive metabolic panel     Status: Abnormal   Collection Time: 04/10/14  5:19 AM  Result Value Ref Range   Sodium 143 137 - 147 mEq/L   Potassium 4.7 3.7 - 5.3 mEq/L   Chloride 105 96 - 112 mEq/L   CO2 27 19 - 32 mEq/L   Glucose, Bld 187 (H) 70 - 99 mg/dL   BUN 17 6 - 23 mg/dL   Creatinine, Ser 0.90 0.50 - 1.10 mg/dL   Calcium 8.9 8.4 - 10.5 mg/dL   Total Protein 6.0 6.0 - 8.3 g/dL   Albumin 3.1 (L) 3.5 - 5.2 g/dL   AST 8 0 - 37 U/L   ALT 12 0 - 35 U/L   Alkaline Phosphatase 65 39 - 117 U/L   Total Bilirubin <0.2 (L) 0.3 - 1.2 mg/dL   GFR calc non Af Amer 67 (L) >90 mL/min   GFR calc Af Amer 78 (L) >90 mL/min    Comment: (NOTE) The eGFR has been calculated using the CKD EPI equation. This calculation  has not been validated in all clinical situations. eGFR's persistently <90 mL/min signify possible Chronic Kidney Disease.    Anion gap 11 5 - 15    ABGS No results for input(s): PHART, PO2ART, TCO2, HCO3 in the last 72 hours.  Invalid input(s): PCO2 CULTURES Recent Results (from the past 240 hour(s))  Culture, blood (routine x 2)     Status: None (Preliminary result)   Collection Time: 04/07/14  7:38 PM  Result Value Ref Range Status   Specimen  Description BLOOD RIGHT ANTECUBITAL  Final   Special Requests BOTTLES DRAWN AEROBIC AND ANAEROBIC 6CC  Final   Culture NO GROWTH 2 DAYS  Final   Report Status PENDING  Incomplete  Culture, blood (routine x 2)     Status: None (Preliminary result)   Collection Time: 04/07/14  7:45 PM  Result Value Ref Range Status   Specimen Description BLOOD LEFT ARM  Final   Special Requests BOTTLES DRAWN AEROBIC AND ANAEROBIC [facility]  Final   Culture  Setup Time   Final    04/08/2014 23:56 Performed at [facility]    Culture   Final    GRAM POSITIVE COCCI IN CLUSTERS Note: Gram Stain Report Called to,Read Back By and Verified With: HORINE C AT 1203 ON 04/08/2014 BY BAUGHAM M. Performed at [facility] Performed at [facility]    Report Status PENDING  Incomplete   Studies/Results: No results found.  Medications: I have reviewed the patient's current medications.  Assesment:   Principal Problem:   CAP (community acquired pneumonia) Active Problems:   Ischemic cardiomyopathy   HTN (hypertension)   Tobacco abuse   Anxiety disorder   CAD (coronary artery disease)   Systolic CHF, chronic   Diabetes   Acute respiratory failure with hypoxia   COPD exacerbation    Plan:  Medications reviewed Will continue current antibiotics pending culture result Will follow final culture report Continue regular treatment    LOS: 3 days   Thurman Sarver 04/10/2014, 7:50 AM

## 2014-04-11 LAB — COMPREHENSIVE METABOLIC PANEL
ALT: 16 U/L (ref 0–35)
AST: 10 U/L (ref 0–37)
Albumin: 2.8 g/dL — ABNORMAL LOW (ref 3.5–5.2)
Alkaline Phosphatase: 60 U/L (ref 39–117)
Anion gap: 9 (ref 5–15)
BUN: 21 mg/dL (ref 6–23)
CO2: 29 mEq/L (ref 19–32)
Calcium: 8.7 mg/dL (ref 8.4–10.5)
Chloride: 103 mEq/L (ref 96–112)
Creatinine, Ser: 1.02 mg/dL (ref 0.50–1.10)
GFR calc Af Amer: 67 mL/min — ABNORMAL LOW (ref >90)
GFR calc non Af Amer: 58 mL/min — ABNORMAL LOW (ref >90)
Glucose, Bld: 185 mg/dL — ABNORMAL HIGH (ref 70–99)
Potassium: 4.6 mEq/L (ref 3.7–5.3)
Sodium: 141 mEq/L (ref 137–147)
Total Bilirubin: <0.2 mg/dL — ABNORMAL LOW (ref 0.3–1.2)
Total Protein: 5.7 g/dL — ABNORMAL LOW (ref 6.0–8.3)

## 2014-04-11 LAB — GLUCOSE, CAPILLARY
Glucose-Capillary: 163 mg/dL — ABNORMAL HIGH (ref 70–99)
Glucose-Capillary: 182 mg/dL — ABNORMAL HIGH (ref 70–99)
Glucose-Capillary: 136 mg/dL — ABNORMAL HIGH (ref 70–99)
Glucose-Capillary: 137 mg/dL — ABNORMAL HIGH (ref 70–99)

## 2014-04-11 LAB — CBC WITH DIFFERENTIAL/PLATELET
Basophils Absolute: 0 10*3/uL (ref 0.0–0.1)
Basophils Relative: 0 % (ref 0–1)
Eosinophils Absolute: 0 10*3/uL (ref 0.0–0.7)
Eosinophils Relative: 0 % (ref 0–5)
HCT: 38.3 % (ref 36.0–46.0)
Hemoglobin: 12.6 g/dL (ref 12.0–15.0)
Lymphocytes Relative: 12 % (ref 12–46)
Lymphs Abs: 0.8 10*3/uL (ref 0.7–4.0)
MCH: 28.9 pg (ref 26.0–34.0)
MCHC: 32.9 g/dL (ref 30.0–36.0)
MCV: 87.8 fL (ref 78.0–100.0)
Monocytes Absolute: 0.2 10*3/uL (ref 0.1–1.0)
Monocytes Relative: 3 % (ref 3–12)
Neutro Abs: 5.2 10*3/uL (ref 1.7–7.7)
Neutrophils Relative %: 85 % — ABNORMAL HIGH (ref 43–77)
Platelets: 203 10*3/uL (ref 150–400)
RBC: 4.36 MIL/uL (ref 3.87–5.11)
RDW: 14.4 % (ref 11.5–15.5)
WBC: 6.1 10*3/uL (ref 4.0–10.5)

## 2014-04-11 LAB — CULTURE, BLOOD (ROUTINE X 2)

## 2014-04-11 MED ORDER — METHYLPREDNISOLONE SODIUM SUCC 40 MG IJ SOLR
INTRAMUSCULAR | Status: AC
  Filled 2014-04-11: qty 1

## 2014-04-11 MED ORDER — CEFAZOLIN SODIUM-DEXTROSE 2-3 GM-% IV SOLR
2.0000 g | Freq: Three times a day (TID) | INTRAVENOUS | Status: DC
  Administered 2014-04-11 – 2014-04-16 (×16): 2 g via INTRAVENOUS
  Filled 2014-04-11 (×19): qty 50

## 2014-04-11 MED ORDER — METHYLPREDNISOLONE SODIUM SUCC 40 MG IJ SOLR
40.0000 mg | Freq: Two times a day (BID) | INTRAMUSCULAR | Status: DC
  Administered 2014-04-11 – 2014-04-16 (×11): 40 mg via INTRAVENOUS
  Filled 2014-04-11 (×8): qty 1

## 2014-04-11 NOTE — Progress Notes (Addendum)
ANTIBIOTIC CONSULT NOTE - INITIAL  Pharmacy Consult for Ancef Indication: bacteremia  No Known Allergies  Patient Measurements: Height: 5\' 1"  (154.9 cm) Weight: 139 lb 12.8 oz (63.413 kg) IBW/kg (Calculated) : 47.8  Vital Signs: Temp: 98.4 F (36.9 C) (12/04 0636) Temp Source: Oral (12/04 0636) BP: 122/68 mmHg (12/04 0636) Pulse Rate: 100 (12/04 0636) Intake/Output from previous day: 12/03 0701 - 12/04 0700 In: 720 [P.O.:720] Out: 3050 [Urine:3050] Intake/Output from this shift: Total I/O In: 240 [P.O.:240] Out: -   Labs:  Recent Labs  04/09/14 0526 04/10/14 0519 04/11/14 0519  WBC 9.4 7.6 6.1  HGB 12.6 12.3 12.6  PLT 208 208 203  CREATININE 0.92 0.90 1.02   Estimated Creatinine Clearance: 48.8 mL/min (by C-G formula based on Cr of 1.02). No results for input(s): VANCOTROUGH, VANCOPEAK, VANCORANDOM, GENTTROUGH, GENTPEAK, GENTRANDOM, TOBRATROUGH, TOBRAPEAK, TOBRARND, AMIKACINPEAK, AMIKACINTROU, AMIKACIN in the last 72 hours.   Microbiology: Recent Results (from the past 720 hour(s))  Culture, blood (routine x 2)     Status: None (Preliminary result)   Collection Time: 04/07/14  7:38 PM  Result Value Ref Range Status   Specimen Description BLOOD RIGHT ANTECUBITAL  Final   Special Requests BOTTLES DRAWN AEROBIC AND ANAEROBIC 6CC  Final   Culture NO GROWTH 3 DAYS  Final   Report Status PENDING  Incomplete  Culture, blood (routine x 2)     Status: None   Collection Time: 04/07/14  7:45 PM  Result Value Ref Range Status   Specimen Description BLOOD LEFT ARM  Final   Special Requests BOTTLES DRAWN AEROBIC AND ANAEROBIC 6CC  Final   Culture  Setup Time   Final    04/08/2014 23:56 Performed at [facility]    Culture   Final    STAPHYLOCOCCUS AUREUS Note: RIFAMPIN AND GENTAMICIN SHOULD NOT BE USED AS SINGLE DRUGS FOR TREATMENT OF STAPH INFECTIONS. STAPHYLOCOCCUS SPECIES (COAGULASE NEGATIVE) Note: THE SIGNIFICANCE OF ISOLATING THIS ORGANISM FROM A SINGLE  VENIPUNCTURE CANNOT BE PREDICTED WITHOUT FURTHER CLINICAL AND CULTURE CORRELATION. SUSCEPTIBILITIES AVAILABLE ONLY ON REQUEST. Note: Gram Stain Report Called to,Read Back By and Verified With: HORINE C AT 1203 ON 04/08/2014 BY BAUGHAM M. Performed at [facility]    Report Status 04/11/2014 FINAL  Final   Organism ID, Bacteria STAPHYLOCOCCUS AUREUS  Final      Susceptibility   Staphylococcus aureus - MIC*    CLINDAMYCIN <=0.25 SENSITIVE Sensitive     ERYTHROMYCIN <=0.25 SENSITIVE Sensitive     GENTAMICIN <=0.5 SENSITIVE Sensitive     LEVOFLOXACIN <=0.12 SENSITIVE Sensitive     OXACILLIN 0.5 SENSITIVE Sensitive     PENICILLIN 0.06 SENSITIVE Sensitive     RIFAMPIN <=0.5 SENSITIVE Sensitive     TRIMETH/SULFA <=10 SENSITIVE Sensitive     VANCOMYCIN 1 SENSITIVE Sensitive     TETRACYCLINE <=1 SENSITIVE Sensitive     MOXIFLOXACIN <=0.25 SENSITIVE Sensitive     * STAPHYLOCOCCUS AUREUS  Culture, respiratory (NON-Expectorated)     Status: None (Preliminary result)   Collection Time: 04/10/14 11:00 AM  Result Value Ref Range Status   Specimen Description SPUTUM EXPECTORATED  Final   Special Requests NONE  Final   Gram Stain   Final    FEW WBC PRESENT,BOTH PMN AND MONONUCLEAR RARE SQUAMOUS EPITHELIAL CELLS PRESENT RARE GRAM POSITIVE COCCI IN PAIRS Performed at [facility]    Culture NO GROWTH Performed at [facility]   Final   Report Status PENDING  Incomplete  Culture, sputum-assessment  Status: None   Collection Time: 04/10/14 11:20 AM  Result Value Ref Range Status   Specimen Description SPUTUM EXPECTORATED  Final   Special Requests NONE  Final   Sputum evaluation   Final    THIS SPECIMEN IS ACCEPTABLE. RESPIRATORY CULTURE REPORT TO FOLLOW. Performed at [facility]    Report Status 04/10/2014 FINAL  Final    Medical History: Past Medical History  Diagnosis Date  . COPD (chronic obstructive pulmonary disease)   . Asthma   . Bronchitis   .  Anxiety   . Back pain   . Demand ischemia of myocardium 06/28/2011  . Thyroid enlargement 06/28/2011  . MI, acute, non ST segment elevation 06/29/2011    Myoview stress test revealed mild perfusion defect  . Ischemic cardiomyopathy 06/28/2011    EF 25%. Myoview stress test later showed ejection fraction of 65%  . PNA (pneumonia) 06/29/2011  . Acute respiratory failure 06/2011    Vent dependent sec to COPD/PNA  . Hypertension   . Coronary artery disease   . Myocardial infarction   . Pneumonia   . Diabetes mellitus without complication   . Arthritis   . Rectocele 10/09/2012  . CHF (congestive heart failure)     Medications:  Scheduled:  . acidophilus  1 capsule Oral Daily  . busPIRone  10 mg Oral TID  .  ceFAZolin (ANCEF) IV  2 g Intravenous 3 times per day  . enoxaparin (LOVENOX) injection  40 mg Subcutaneous Q24H  . gabapentin  300 mg Oral TID  . guaiFENesin  1,200 mg Oral BID  . HYDROcodone-acetaminophen  1 tablet Oral BID  . insulin aspart  0-20 Units Subcutaneous TID WC  . insulin aspart  0-5 Units Subcutaneous QHS  . ipratropium-albuterol  3 mL Nebulization Q4H  . levothyroxine  25 mcg Oral QAC breakfast  . loratadine  10 mg Oral Daily  . methylPREDNISolone (SOLU-MEDROL) injection  40 mg Intravenous Q12H  . nicotine  14 mg Transdermal Daily  . pantoprazole  40 mg Oral Daily  . PARoxetine  30 mg Oral Daily  . pneumococcal 23 valent vaccine  0.5 mL Intramuscular Tomorrow-1000  . polyethylene glycol  17 g Oral Daily  . rosuvastatin  10 mg Oral q1800  . tiotropium  18 mcg Inhalation Daily  . [START ON 04/14/2014] Vitamin D (Ergocalciferol)  50,000 Units Oral Q7 days   Assessment: 57 yoF with +MSSA bacteremia.  She was admitted on 11/30 with COPD exacerbation & CXR + PNA.   She is currently afebrile with normal WBC.   Repeat blood cx & TEE pending.  Scr trending up.   Patient discussed with ID pharmacist at [facility].  Levaquin 11/20>>12/4 Vancomycin 12/1>>12/4 Ancef  12/4>>  Goal of Therapy:  Eradicate infection.  Plan:  Ancef 2gm IV q8h Monitor renal function and cx data  Duration of therapy per MD--> patient will need at least 4 weeks of IV Ancef from date of 1st negative blood culture.   Biagio Borg 04/11/2014,11:11 AM

## 2014-04-11 NOTE — Care Management Utilization Note (Signed)
Pt plans to discharge home over weekend. [facility] will resume services at discharge and [facility] RN through [facility], per pt's choice, will be ordered. Romualdo Bolk of [facility], per pt's choice, is aware of pending discharge. Pt's RN given instructions on how to complete referral at discharge. No further CM needs identified at this time.    Pt given IM on 04/11/2014 by Daryl Eastern, RN.

## 2014-04-11 NOTE — Progress Notes (Signed)
    [place] for Infectious Disease        [place] Antimicrobial Management Team Staphylococcus aureus bacteremia   Staphylococcus aureus bacteremia (SAB) is associated with a high rate of complications and mortality.  Specific aspects of clinical management are critical to optimizing the outcome of patients with SAB.  Therefore, the [facility] Antimicrobial Management Team [facility]) has initiated an intervention aimed at improving the management of SAB at [facility].  To do so, Infectious Diseases physicians are providing an evidence-based consult for the management of all patients with SAB.     Yes No Comments  Perform follow-up blood cultures (even if the patient is afebrile) to ensure clearance of bacteremia [x]  []  04/11/14  Remove vascular catheter and obtain follow-up blood cultures after the removal of the catheter []  []  DO NOT PLACE PICC UNTIL WE HAVE GOT 72 HOURS OF NO GROWTH ON BLOOD CULTURES   Perform echocardiography to evaluate for endocarditis (transthoracic ECHO is 40-50% sensitive, TEE is > 90% sensitive) []  []  Please keep in mind, that neither test can definitively EXCLUDE endocarditis, and that should clinical suspicion remain high for endocarditis the patient should then still be treated with an "endocarditis" duration of therapy = 6 weeks TTE pending,    Consult electrophysiologist to evaluate implanted cardiac device (pacemaker, ICD) []  []  NA  Ensure source control []  []  Have all abscesses been drained effectively? Have deep seeded infections (septic joints or osteomyelitis) had appropriate surgical debridement?  Not clear what source it to me other than possibly lungs   Investigate for "metastatic" sites of infection []  []  Does the patient have ANY symptom or physical exam finding that would suggest a deeper infection (back or neck pain that may be suggestive of vertebral osteomyelitis or epidural abscess, muscle pain that could be a symptom of  pyomyositis)?  Keep in mind that for deep seeded infections MRI imaging with contrast is preferred rather than other often insensitive tests such as plain x-rays, especially early in a patient's presentation.  Change antibiotic therapy to ANCEF 2G IV Q 8 HOURS []  []  Beta-lactam antibiotics are preferred for MSSA due to higher cure rates.   If on Vancomycin, goal trough should be 15 - 20 mcg/mL  Estimated duration of IV antibiotic therapy:  4 WEEKS OF NO TEE AND NO ENDOCARDITIS BY TTE, IF ENDOCARDITIS ON TTE NEEDS 6 WEEKS AND NEEDS TEE []  []  Consult case management for probably prolonged outpatient IV antibiotic therapy   Case discussed with Dr. Legrand Rams over cell phone.

## 2014-04-11 NOTE — Progress Notes (Signed)
Patient c/o constipation medicated with dulcolax supp.

## 2014-04-11 NOTE — Progress Notes (Signed)
Subjective: Patient is resting.She feels better. No fever or chills. She is growing staph aureus in her blood. Antibiotics sensitivity on the culture is pending.  Objective: Vital signs in last 24 hours: Temp:  [98.4 F (36.9 C)-98.6 F (37 C)] 98.4 F (36.9 C) (12/04 0636) Pulse Rate:  [94-102] 100 (12/04 0636) Resp:  [18] 18 (12/04 0636) BP: (100-122)/(68-76) 122/68 mmHg (12/04 0636) SpO2:  [94 %-100 %] 95 % (12/04 0636) Weight change:  Last BM Date: 04/08/14  Intake/Output from previous day: 12/03 0701 - 12/04 0700 In: 720 [P.O.:720] Out: 3050 [Urine:3050]  PHYSICAL EXAM General appearance: alert and no distress Resp: diminished breath sounds bilaterally and rhonchi bilaterally Cardio: S1, S2 normal GI: soft, non-tender; bowel sounds normal; no masses,  no organomegaly Extremities: extremities normal, atraumatic, no cyanosis or edema  Lab Results:  Results for orders placed or performed during the hospital encounter of 04/07/14 (from the past 48 hour(s))  Glucose, capillary     Status: Abnormal   Collection Time: 04/09/14 11:23 AM  Result Value Ref Range   Glucose-Capillary 180 (H) 70 - 99 mg/dL   Comment 1 Documented in Chart    Comment 2 Notify RN   Glucose, capillary     Status: Abnormal   Collection Time: 04/09/14  4:14 PM  Result Value Ref Range   Glucose-Capillary 167 (H) 70 - 99 mg/dL   Comment 1 Documented in Chart    Comment 2 Notify RN   Glucose, capillary     Status: Abnormal   Collection Time: 04/09/14  8:31 PM  Result Value Ref Range   Glucose-Capillary 163 (H) 70 - 99 mg/dL   Comment 1 Notify RN   CBC WITH DIFFERENTIAL     Status: Abnormal   Collection Time: 04/10/14  5:19 AM  Result Value Ref Range   WBC 7.6 4.0 - 10.5 K/uL   RBC 4.32 3.87 - 5.11 MIL/uL   Hemoglobin 12.3 12.0 - 15.0 g/dL   HCT 38.3 36.0 - 46.0 %   MCV 88.7 78.0 - 100.0 fL   MCH 28.5 26.0 - 34.0 pg   MCHC 32.1 30.0 - 36.0 g/dL   RDW 14.6 11.5 - 15.5 %   Platelets 208 150 -  400 K/uL   Neutrophils Relative % 89 (H) 43 - 77 %   Neutro Abs 6.8 1.7 - 7.7 K/uL   Lymphocytes Relative 9 (L) 12 - 46 %   Lymphs Abs 0.7 0.7 - 4.0 K/uL   Monocytes Relative 2 (L) 3 - 12 %   Monocytes Absolute 0.2 0.1 - 1.0 K/uL   Eosinophils Relative 0 0 - 5 %   Eosinophils Absolute 0.0 0.0 - 0.7 K/uL   Basophils Relative 0 0 - 1 %   Basophils Absolute 0.0 0.0 - 0.1 K/uL  Comprehensive metabolic panel     Status: Abnormal   Collection Time: 04/10/14  5:19 AM  Result Value Ref Range   Sodium 143 137 - 147 mEq/L   Potassium 4.7 3.7 - 5.3 mEq/L   Chloride 105 96 - 112 mEq/L   CO2 27 19 - 32 mEq/L   Glucose, Bld 187 (H) 70 - 99 mg/dL   BUN 17 6 - 23 mg/dL   Creatinine, Ser 0.90 0.50 - 1.10 mg/dL   Calcium 8.9 8.4 - 10.5 mg/dL   Total Protein 6.0 6.0 - 8.3 g/dL   Albumin 3.1 (L) 3.5 - 5.2 g/dL   AST 8 0 - 37 U/L   ALT 12 0 -  35 U/L   Alkaline Phosphatase 65 39 - 117 U/L   Total Bilirubin <0.2 (L) 0.3 - 1.2 mg/dL   GFR calc non Af Amer 67 (L) >90 mL/min   GFR calc Af Amer 78 (L) >90 mL/min    Comment: (NOTE) The eGFR has been calculated using the CKD EPI equation. This calculation has not been validated in all clinical situations. eGFR's persistently <90 mL/min signify possible Chronic Kidney Disease.    Anion gap 11 5 - 15  Glucose, capillary     Status: Abnormal   Collection Time: 04/10/14  7:35 AM  Result Value Ref Range   Glucose-Capillary 147 (H) 70 - 99 mg/dL   Comment 1 Notify RN    Comment 2 Documented in Chart   Culture, sputum-assessment     Status: None   Collection Time: 04/10/14 11:20 AM  Result Value Ref Range   Specimen Description SPUTUM EXPECTORATED    Special Requests NONE    Sputum evaluation      THIS SPECIMEN IS ACCEPTABLE. RESPIRATORY CULTURE REPORT TO FOLLOW. Performed at [facility]    Report Status 04/10/2014 FINAL   Glucose, capillary     Status: Abnormal   Collection Time: 04/10/14 11:51 AM  Result Value Ref Range    Glucose-Capillary 198 (H) 70 - 99 mg/dL   Comment 1 Notify RN   Glucose, capillary     Status: Abnormal   Collection Time: 04/10/14  4:21 PM  Result Value Ref Range   Glucose-Capillary 118 (H) 70 - 99 mg/dL   Comment 1 Notify RN   Glucose, capillary     Status: Abnormal   Collection Time: 04/10/14  8:26 PM  Result Value Ref Range   Glucose-Capillary 250 (H) 70 - 99 mg/dL   Comment 1 Notify RN   CBC WITH DIFFERENTIAL     Status: Abnormal   Collection Time: 04/11/14  5:19 AM  Result Value Ref Range   WBC 6.1 4.0 - 10.5 K/uL   RBC 4.36 3.87 - 5.11 MIL/uL   Hemoglobin 12.6 12.0 - 15.0 g/dL   HCT 38.3 36.0 - 46.0 %   MCV 87.8 78.0 - 100.0 fL   MCH 28.9 26.0 - 34.0 pg   MCHC 32.9 30.0 - 36.0 g/dL   RDW 14.4 11.5 - 15.5 %   Platelets 203 150 - 400 K/uL   Neutrophils Relative % 85 (H) 43 - 77 %   Neutro Abs 5.2 1.7 - 7.7 K/uL   Lymphocytes Relative 12 12 - 46 %   Lymphs Abs 0.8 0.7 - 4.0 K/uL   Monocytes Relative 3 3 - 12 %   Monocytes Absolute 0.2 0.1 - 1.0 K/uL   Eosinophils Relative 0 0 - 5 %   Eosinophils Absolute 0.0 0.0 - 0.7 K/uL   Basophils Relative 0 0 - 1 %   Basophils Absolute 0.0 0.0 - 0.1 K/uL  Comprehensive metabolic panel     Status: Abnormal   Collection Time: 04/11/14  5:19 AM  Result Value Ref Range   Sodium 141 137 - 147 mEq/L   Potassium 4.6 3.7 - 5.3 mEq/L   Chloride 103 96 - 112 mEq/L   CO2 29 19 - 32 mEq/L   Glucose, Bld 185 (H) 70 - 99 mg/dL   BUN 21 6 - 23 mg/dL   Creatinine, Ser 1.02 0.50 - 1.10 mg/dL   Calcium 8.7 8.4 - 10.5 mg/dL   Total Protein 5.7 (L) 6.0 - 8.3 g/dL   Albumin  2.8 (L) 3.5 - 5.2 g/dL   AST 10 0 - 37 U/L   ALT 16 0 - 35 U/L   Alkaline Phosphatase 60 39 - 117 U/L   Total Bilirubin <0.2 (L) 0.3 - 1.2 mg/dL   GFR calc non Af Amer 58 (L) >90 mL/min   GFR calc Af Amer 67 (L) >90 mL/min    Comment: (NOTE) The eGFR has been calculated using the CKD EPI equation. This calculation has not been validated in all clinical  situations. eGFR's persistently <90 mL/min signify possible Chronic Kidney Disease.    Anion gap 9 5 - 15  Glucose, capillary     Status: Abnormal   Collection Time: 04/11/14  7:18 AM  Result Value Ref Range   Glucose-Capillary 163 (H) 70 - 99 mg/dL   Comment 1 Documented in Chart    Comment 2 Notify RN     ABGS No results for input(s): PHART, PO2ART, TCO2, HCO3 in the last 72 hours.  Invalid input(s): PCO2 CULTURES Recent Results (from the past 240 hour(s))  Culture, blood (routine x 2)     Status: None (Preliminary result)   Collection Time: 04/07/14  7:38 PM  Result Value Ref Range Status   Specimen Description BLOOD RIGHT ANTECUBITAL  Final   Special Requests BOTTLES DRAWN AEROBIC AND ANAEROBIC 6CC  Final   Culture NO GROWTH 3 DAYS  Final   Report Status PENDING  Incomplete  Culture, blood (routine x 2)     Status: None (Preliminary result)   Collection Time: 04/07/14  7:45 PM  Result Value Ref Range Status   Specimen Description BLOOD LEFT ARM  Final   Special Requests BOTTLES DRAWN AEROBIC AND ANAEROBIC 6CC  Final   Culture  Setup Time   Final    04/08/2014 23:56 Performed at [facility]    Culture   Final    STAPHYLOCOCCUS AUREUS Note: RIFAMPIN AND GENTAMICIN SHOULD NOT BE USED AS SINGLE DRUGS FOR TREATMENT OF STAPH INFECTIONS. STAPHYLOCOCCUS SPECIES (COAGULASE NEGATIVE) Note: THE SIGNIFICANCE OF ISOLATING THIS ORGANISM FROM A SINGLE VENIPUNCTURE CANNOT BE PREDICTED WITHOUT FURTHER CLINICAL AND CULTURE CORRELATION. SUSCEPTIBILITIES AVAILABLE ONLY ON REQUEST. Note: Gram Stain Report Called to,Read Back By and Verified With: HORINE C AT 1203 ON 04/08/2014 BY BAUGHAM M. Performed at [facility]    Report Status PENDING  Incomplete  Culture, sputum-assessment     Status: None   Collection Time: 04/10/14 11:20 AM  Result Value Ref Range Status   Specimen Description SPUTUM EXPECTORATED  Final   Special Requests NONE  Final   Sputum evaluation   Final     THIS SPECIMEN IS ACCEPTABLE. RESPIRATORY CULTURE REPORT TO FOLLOW. Performed at [facility]    Report Status 04/10/2014 FINAL  Final   Studies/Results: No results found.  Medications: I have reviewed the patient's current medications.  Assesment:   Principal Problem:   CAP (community acquired pneumonia) Active Problems:   Ischemic cardiomyopathy   HTN (hypertension)   Tobacco abuse   Anxiety disorder   CAD (coronary artery disease)   Systolic CHF, chronic   Diabetes   Acute respiratory failure with hypoxia   COPD exacerbation    Plan:  Medications reviewed Will continue current antibiotics  Will do echo  Continue regular treatment    LOS: 4 days   Reynalda Canny 04/11/2014, 7:30 AM

## 2014-04-12 LAB — COMPREHENSIVE METABOLIC PANEL
ALT: 20 U/L (ref 0–35)
AST: 11 U/L (ref 0–37)
Albumin: 2.9 g/dL — ABNORMAL LOW (ref 3.5–5.2)
Alkaline Phosphatase: 56 U/L (ref 39–117)
Anion gap: 9 (ref 5–15)
BUN: 24 mg/dL — ABNORMAL HIGH (ref 6–23)
CO2: 32 mEq/L (ref 19–32)
Calcium: 8.6 mg/dL (ref 8.4–10.5)
Chloride: 101 mEq/L (ref 96–112)
Creatinine, Ser: 1 mg/dL (ref 0.50–1.10)
GFR calc Af Amer: 68 mL/min — ABNORMAL LOW (ref >90)
GFR calc non Af Amer: 59 mL/min — ABNORMAL LOW (ref >90)
Glucose, Bld: 172 mg/dL — ABNORMAL HIGH (ref 70–99)
Potassium: 4.8 mEq/L (ref 3.7–5.3)
Sodium: 142 mEq/L (ref 137–147)
Total Bilirubin: 0.2 mg/dL — ABNORMAL LOW (ref 0.3–1.2)
Total Protein: 5.8 g/dL — ABNORMAL LOW (ref 6.0–8.3)

## 2014-04-12 LAB — CBC WITH DIFFERENTIAL/PLATELET
Basophils Absolute: 0 10*3/uL (ref 0.0–0.1)
Basophils Relative: 0 % (ref 0–1)
Eosinophils Absolute: 0 10*3/uL (ref 0.0–0.7)
Eosinophils Relative: 0 % (ref 0–5)
HCT: 39.4 % (ref 36.0–46.0)
Hemoglobin: 12.9 g/dL (ref 12.0–15.0)
Lymphocytes Relative: 17 % (ref 12–46)
Lymphs Abs: 1.1 10*3/uL (ref 0.7–4.0)
MCH: 28.5 pg (ref 26.0–34.0)
MCHC: 32.7 g/dL (ref 30.0–36.0)
MCV: 87 fL (ref 78.0–100.0)
Monocytes Absolute: 0.3 10*3/uL (ref 0.1–1.0)
Monocytes Relative: 4 % (ref 3–12)
Neutro Abs: 5 10*3/uL (ref 1.7–7.7)
Neutrophils Relative %: 79 % — ABNORMAL HIGH (ref 43–77)
Platelets: 209 10*3/uL (ref 150–400)
RBC: 4.53 MIL/uL (ref 3.87–5.11)
RDW: 14.4 % (ref 11.5–15.5)
WBC: 6.4 10*3/uL (ref 4.0–10.5)

## 2014-04-12 LAB — CULTURE, BLOOD (ROUTINE X 2): Culture: NO GROWTH

## 2014-04-12 LAB — GLUCOSE, CAPILLARY
Glucose-Capillary: 148 mg/dL — ABNORMAL HIGH (ref 70–99)
Glucose-Capillary: 237 mg/dL — ABNORMAL HIGH (ref 70–99)
Glucose-Capillary: 153 mg/dL — ABNORMAL HIGH (ref 70–99)
Glucose-Capillary: 151 mg/dL — ABNORMAL HIGH (ref 70–99)

## 2014-04-12 MED ORDER — METHYLPREDNISOLONE SODIUM SUCC 40 MG IJ SOLR
INTRAMUSCULAR | Status: AC
  Administered 2014-04-12: 08:00:00
  Filled 2014-04-12: qty 1

## 2014-04-12 NOTE — Progress Notes (Signed)
Subjective: Patient is resting. No fever or chills. Talked to ID yesterday and adjusted antibiotics according to the recommendation. Blood culture is repeated. Echo is requested. Objective: Vital signs in last 24 hours: Temp:  [98 F (36.7 C)-98.5 F (36.9 C)] 98 F (36.7 C) (12/05 0630) Pulse Rate:  [85-101] 85 (12/05 0630) Resp:  [18-20] 20 (12/05 0630) BP: (103-139)/(67-77) 139/77 mmHg (12/05 0630) SpO2:  [92 %-100 %] 97 % (12/05 0732) Weight change:  Last BM Date: 04/10/14 (Pt states she had a hard bm yesterday)  Intake/Output from previous day: 12/04 0701 - 12/05 0700 In: 960 [P.O.:720; I.V.:140; IV Piggyback:100] Out: 1101 [Urine:1101]  PHYSICAL EXAM General appearance: alert and no distress Resp: diminished breath sounds bilaterally and rhonchi bilaterally Cardio: S1, S2 normal GI: soft, non-tender; bowel sounds normal; no masses,  no organomegaly Extremities: extremities normal, atraumatic, no cyanosis or edema  Lab Results:  Results for orders placed or performed during the hospital encounter of 04/07/14 (from the past 48 hour(s))  Culture, respiratory (NON-Expectorated)     Status: None (Preliminary result)   Collection Time: 04/10/14 11:00 AM  Result Value Ref Range   Specimen Description SPUTUM EXPECTORATED    Special Requests NONE    Gram Stain      FEW WBC PRESENT,BOTH PMN AND MONONUCLEAR RARE SQUAMOUS EPITHELIAL CELLS PRESENT RARE GRAM POSITIVE COCCI IN PAIRS Performed at [facility]    Culture NO GROWTH Performed at [facility]     Report Status PENDING   Culture, sputum-assessment     Status: None   Collection Time: 04/10/14 11:20 AM  Result Value Ref Range   Specimen Description SPUTUM EXPECTORATED    Special Requests NONE    Sputum evaluation      THIS SPECIMEN IS ACCEPTABLE. RESPIRATORY CULTURE REPORT TO FOLLOW. Performed at [facility]    Report Status 04/10/2014 FINAL   Glucose, capillary     Status: Abnormal   Collection Time: 04/10/14 11:51 AM  Result Value Ref Range   Glucose-Capillary 198 (H) 70 - 99 mg/dL   Comment 1 Notify RN   Glucose, capillary     Status: Abnormal   Collection Time: 04/10/14  4:21 PM  Result Value Ref Range   Glucose-Capillary 118 (H) 70 - 99 mg/dL   Comment 1 Notify RN   Glucose, capillary     Status: Abnormal   Collection Time: 04/10/14  8:26 PM  Result Value Ref Range   Glucose-Capillary 250 (H) 70 - 99 mg/dL   Comment 1 Notify RN   CBC WITH DIFFERENTIAL     Status: Abnormal   Collection Time: 04/11/14  5:19 AM  Result Value Ref Range   WBC 6.1 4.0 - 10.5 K/uL   RBC 4.36 3.87 - 5.11 MIL/uL   Hemoglobin 12.6 12.0 - 15.0 g/dL   HCT 38.3 36.0 - 46.0 %   MCV 87.8 78.0 - 100.0 fL   MCH 28.9 26.0 - 34.0 pg   MCHC 32.9 30.0 - 36.0 g/dL   RDW 14.4 11.5 - 15.5 %   Platelets 203 150 - 400 K/uL   Neutrophils Relative % 85 (H) 43 - 77 %   Neutro Abs 5.2 1.7 - 7.7 K/uL   Lymphocytes Relative 12 12 - 46 %   Lymphs Abs 0.8 0.7 - 4.0 K/uL   Monocytes Relative 3 3 - 12 %   Monocytes Absolute 0.2 0.1 - 1.0 K/uL   Eosinophils Relative 0 0 - 5 %   Eosinophils Absolute  0.0 0.0 - 0.7 K/uL   Basophils Relative 0 0 - 1 %   Basophils Absolute 0.0 0.0 - 0.1 K/uL  Comprehensive metabolic panel     Status: Abnormal   Collection Time: 04/11/14  5:19 AM  Result Value Ref Range   Sodium 141 137 - 147 mEq/L   Potassium 4.6 3.7 - 5.3 mEq/L   Chloride 103 96 - 112 mEq/L   CO2 29 19 - 32 mEq/L   Glucose, Bld 185 (H) 70 - 99 mg/dL   BUN 21 6 - 23 mg/dL   Creatinine, Ser 1.02 0.50 - 1.10 mg/dL   Calcium 8.7 8.4 - 10.5 mg/dL   Total Protein 5.7 (L) 6.0 - 8.3 g/dL   Albumin 2.8 (L) 3.5 - 5.2 g/dL   AST 10 0 - 37 U/L   ALT 16 0 - 35 U/L   Alkaline Phosphatase 60 39 - 117 U/L   Total Bilirubin <0.2 (L) 0.3 - 1.2 mg/dL   GFR calc non Af Amer 58 (L) >90 mL/min   GFR calc Af Amer 67 (L) >90 mL/min    Comment: (NOTE) The eGFR has been calculated using the CKD EPI equation. This  calculation has not been validated in all clinical situations. eGFR's persistently <90 mL/min signify possible Chronic Kidney Disease.    Anion gap 9 5 - 15  Glucose, capillary     Status: Abnormal   Collection Time: 04/11/14  7:18 AM  Result Value Ref Range   Glucose-Capillary 163 (H) 70 - 99 mg/dL   Comment 1 Documented in Chart    Comment 2 Notify RN   Culture, blood (routine x 2)     Status: None (Preliminary result)   Collection Time: 04/11/14 11:14 AM  Result Value Ref Range   Specimen Description RIGHT ANTECUBITAL    Special Requests BOTTLES DRAWN AEROBIC AND ANAEROBIC 8CC EACH    Culture NO GROWTH 1 DAY    Report Status PENDING   Culture, blood (routine x 2)     Status: None (Preliminary result)   Collection Time: 04/11/14 11:20 AM  Result Value Ref Range   Specimen Description BLOOD RIGHT HAND    Special Requests BOTTLES DRAWN AEROBIC AND ANAEROBIC 6CC EACH    Culture NO GROWTH 1 DAY    Report Status PENDING   Glucose, capillary     Status: Abnormal   Collection Time: 04/11/14 11:28 AM  Result Value Ref Range   Glucose-Capillary 182 (H) 70 - 99 mg/dL   Comment 1 Documented in Chart    Comment 2 Notify RN   Glucose, capillary     Status: Abnormal   Collection Time: 04/11/14  5:02 PM  Result Value Ref Range   Glucose-Capillary 136 (H) 70 - 99 mg/dL   Comment 1 Notify RN    Comment 2 Documented in Chart   Glucose, capillary     Status: Abnormal   Collection Time: 04/11/14  8:42 PM  Result Value Ref Range   Glucose-Capillary 137 (H) 70 - 99 mg/dL   Comment 1 Documented in Chart    Comment 2 Notify RN   CBC WITH DIFFERENTIAL     Status: Abnormal   Collection Time: 04/12/14  5:44 AM  Result Value Ref Range   WBC 6.4 4.0 - 10.5 K/uL   RBC 4.53 3.87 - 5.11 MIL/uL   Hemoglobin 12.9 12.0 - 15.0 g/dL   HCT 39.4 36.0 - 46.0 %   MCV 87.0 78.0 - 100.0 fL   MCH  28.5 26.0 - 34.0 pg   MCHC 32.7 30.0 - 36.0 g/dL   RDW 14.4 11.5 - 15.5 %   Platelets 209 150 - 400 K/uL    Neutrophils Relative % 79 (H) 43 - 77 %   Neutro Abs 5.0 1.7 - 7.7 K/uL   Lymphocytes Relative 17 12 - 46 %   Lymphs Abs 1.1 0.7 - 4.0 K/uL   Monocytes Relative 4 3 - 12 %   Monocytes Absolute 0.3 0.1 - 1.0 K/uL   Eosinophils Relative 0 0 - 5 %   Eosinophils Absolute 0.0 0.0 - 0.7 K/uL   Basophils Relative 0 0 - 1 %   Basophils Absolute 0.0 0.0 - 0.1 K/uL  Comprehensive metabolic panel     Status: Abnormal   Collection Time: 04/12/14  5:44 AM  Result Value Ref Range   Sodium 142 137 - 147 mEq/L   Potassium 4.8 3.7 - 5.3 mEq/L   Chloride 101 96 - 112 mEq/L   CO2 32 19 - 32 mEq/L   Glucose, Bld 172 (H) 70 - 99 mg/dL   BUN 24 (H) 6 - 23 mg/dL   Creatinine, Ser 1.00 0.50 - 1.10 mg/dL   Calcium 8.6 8.4 - 10.5 mg/dL   Total Protein 5.8 (L) 6.0 - 8.3 g/dL   Albumin 2.9 (L) 3.5 - 5.2 g/dL   AST 11 0 - 37 U/L   ALT 20 0 - 35 U/L   Alkaline Phosphatase 56 39 - 117 U/L   Total Bilirubin 0.2 (L) 0.3 - 1.2 mg/dL   GFR calc non Af Amer 59 (L) >90 mL/min   GFR calc Af Amer 68 (L) >90 mL/min    Comment: (NOTE) The eGFR has been calculated using the CKD EPI equation. This calculation has not been validated in all clinical situations. eGFR's persistently <90 mL/min signify possible Chronic Kidney Disease.    Anion gap 9 5 - 15  Glucose, capillary     Status: Abnormal   Collection Time: 04/12/14  7:43 AM  Result Value Ref Range   Glucose-Capillary 148 (H) 70 - 99 mg/dL   Comment 1 Notify RN    Comment 2 Documented in Chart     ABGS No results for input(s): PHART, PO2ART, TCO2, HCO3 in the last 72 hours.  Invalid input(s): PCO2 CULTURES Recent Results (from the past 240 hour(s))  Culture, blood (routine x 2)     Status: None   Collection Time: 04/07/14  7:38 PM  Result Value Ref Range Status   Specimen Description BLOOD RIGHT ANTECUBITAL  Final   Special Requests BOTTLES DRAWN AEROBIC AND ANAEROBIC 6CC  Final   Culture NO GROWTH 5 DAYS  Final   Report Status 04/12/2014 FINAL   Final  Culture, blood (routine x 2)     Status: None   Collection Time: 04/07/14  7:45 PM  Result Value Ref Range Status   Specimen Description BLOOD LEFT ARM  Final   Special Requests BOTTLES DRAWN AEROBIC AND ANAEROBIC 6CC  Final   Culture  Setup Time   Final    04/08/2014 23:56 Performed at [facility]    Culture   Final    STAPHYLOCOCCUS AUREUS Note: RIFAMPIN AND GENTAMICIN SHOULD NOT BE USED AS SINGLE DRUGS FOR TREATMENT OF STAPH INFECTIONS. STAPHYLOCOCCUS SPECIES (COAGULASE NEGATIVE) Note: THE SIGNIFICANCE OF ISOLATING THIS ORGANISM FROM A SINGLE VENIPUNCTURE CANNOT BE PREDICTED WITHOUT FURTHER CLINICAL AND CULTURE CORRELATION. SUSCEPTIBILITIES AVAILABLE ONLY ON REQUEST. Note: Gram Stain Report Called to,Read  Back By and Verified With: HORINE C AT 1203 ON 04/08/2014 BY BAUGHAM M. Performed at [facility]    Report Status 04/11/2014 FINAL  Final   Organism ID, Bacteria STAPHYLOCOCCUS AUREUS  Final      Susceptibility   Staphylococcus aureus - MIC*    CLINDAMYCIN <=0.25 SENSITIVE Sensitive     ERYTHROMYCIN <=0.25 SENSITIVE Sensitive     GENTAMICIN <=0.5 SENSITIVE Sensitive     LEVOFLOXACIN <=0.12 SENSITIVE Sensitive     OXACILLIN 0.5 SENSITIVE Sensitive     PENICILLIN 0.06 SENSITIVE Sensitive     RIFAMPIN <=0.5 SENSITIVE Sensitive     TRIMETH/SULFA <=10 SENSITIVE Sensitive     VANCOMYCIN 1 SENSITIVE Sensitive     TETRACYCLINE <=1 SENSITIVE Sensitive     MOXIFLOXACIN <=0.25 SENSITIVE Sensitive     * STAPHYLOCOCCUS AUREUS  Culture, respiratory (NON-Expectorated)     Status: None (Preliminary result)   Collection Time: 04/10/14 11:00 AM  Result Value Ref Range Status   Specimen Description SPUTUM EXPECTORATED  Final   Special Requests NONE  Final   Gram Stain   Final    FEW WBC PRESENT,BOTH PMN AND MONONUCLEAR RARE SQUAMOUS EPITHELIAL CELLS PRESENT RARE GRAM POSITIVE COCCI IN PAIRS Performed at [facility]    Culture NO GROWTH Performed at  [facility]   Final   Report Status PENDING  Incomplete  Culture, sputum-assessment     Status: None   Collection Time: 04/10/14 11:20 AM  Result Value Ref Range Status   Specimen Description SPUTUM EXPECTORATED  Final   Special Requests NONE  Final   Sputum evaluation   Final    THIS SPECIMEN IS ACCEPTABLE. RESPIRATORY CULTURE REPORT TO FOLLOW. Performed at [facility]    Report Status 04/10/2014 FINAL  Final  Culture, blood (routine x 2)     Status: None (Preliminary result)   Collection Time: 04/11/14 11:14 AM  Result Value Ref Range Status   Specimen Description RIGHT ANTECUBITAL  Final   Special Requests BOTTLES DRAWN AEROBIC AND ANAEROBIC 8CC EACH  Final   Culture NO GROWTH 1 DAY  Final   Report Status PENDING  Incomplete  Culture, blood (routine x 2)     Status: None (Preliminary result)   Collection Time: 04/11/14 11:20 AM  Result Value Ref Range Status   Specimen Description BLOOD RIGHT HAND  Final   Special Requests BOTTLES DRAWN AEROBIC AND ANAEROBIC [place]  Final   Culture NO GROWTH 1 DAY  Final   Report Status PENDING  Incomplete   Studies/Results: No results found.  Medications: I have reviewed the patient's current medications.  Assesment:   Principal Problem:   CAP (community acquired pneumonia) Active Problems:   Ischemic cardiomyopathy   HTN (hypertension)   Tobacco abuse   Anxiety disorder   CAD (coronary artery disease)   Systolic CHF, chronic   Diabetes   Acute respiratory failure with hypoxia   COPD exacerbation    Plan:  Medications reviewed ID recommendation note Will continue Ancef as per the recommendation Continue regular treatment    LOS: 5 days   Lakisha Peyser 04/12/2014, 9:50 AM

## 2014-04-12 NOTE — Plan of Care (Signed)
Problem: Phase I Progression Outcomes Goal: O2 sats > or equal 90% or at baseline Outcome: Progressing OXYGEN IN USE VIA NASAL CANNULA Goal: Dyspnea controlled at rest Outcome: Completed/Met Date Met:  04/12/14 Goal: Hemodynamically stable Outcome: Progressing Goal: Pain controlled Outcome: Completed/Met Date Met:  04/12/14 Goal: Progress activity as tolerated unless otherwise ordered Outcome: Progressing Patient ambulates in room Goal: Tolerating diet Outcome: Completed/Met Date Met:  04/12/14

## 2014-04-12 NOTE — Progress Notes (Signed)
      [facility] Infectious Disease        [facility]icrobial Management Team Staphylococcus aureus bacteremia   Staphylococcus aureus bacteremia (SAB) is associated with a high rate of complications and mortality.  Specific aspects of clinical management are critical to optimizing the outcome of patients with SAB.  Therefore, the [facility] Antimicrobial Management Team [facility]) has initiated an intervention aimed at improving the management of SAB at [facility].  To do so, Infectious Diseases physicians are providing an evidence-based consult for the management of all patients with SAB.     Yes No Comments  Perform follow-up blood cultures (even if the patient is afebrile) to ensure clearance of bacteremia [x]  []  04/11/14 taken  Remove vascular catheter and obtain follow-up blood cultures after the removal of the catheter []  []  DO NOT PLACE PICC UNTIL WE HAVE GOT 72 HOURS OF NO GROWTH ON BLOOD CULTURES   Perform echocardiography to evaluate for endocarditis (transthoracic ECHO is 40-50% sensitive, TEE is > 90% sensitive) []  []  Please keep in mind, that neither test can definitively EXCLUDE endocarditis, and that should clinical suspicion remain high for endocarditis the patient should then still be treated with an "endocarditis" duration of therapy = 6 weeks TTE done results pending   Consult electrophysiologist to evaluate implanted cardiac device (pacemaker, ICD) []  []  NA  Ensure source control []  []  Have all abscesses been drained effectively? Have deep seeded infections (septic joints or osteomyelitis) had appropriate surgical debridement?  Not clear what source it to me other than possibly lungs   Investigate for "metastatic" sites of infection []  []  Does the patient have ANY symptom or physical exam finding that would suggest a deeper infection (back or neck pain that may be suggestive of vertebral osteomyelitis or epidural abscess, muscle pain that could be a symptom  of pyomyositis)?  Keep in mind that for deep seeded infections MRI imaging with contrast is preferred rather than other often insensitive tests such as plain x-rays, especially early in a patient's presentation.  Change antibiotic therapy to ANCEF 2G IV Q 8 HOURS []  []  Beta-lactam antibiotics are preferred for MSSA due to higher cure rates.   If on Vancomycin, goal trough should be 15 - 20 mcg/mL  Estimated duration of IV antibiotic therapy:  If no TEE then 4-6 weeks of IV ancef with day # 2 being 04/11/14 if cultures from that date still negative []  []  Consult case management for probably prolonged outpatient IV antibiotic therapy   Patient will need to followup at    [facility] [address] [facility] [place], [place], [address]  In the next 2-3 weeks (prior to 04/30/14)  Phone [phone] Fax: [phone]  Weekly CBC c diff , BMP should be faxed to Dr. Tommy Medal @ [phone]

## 2014-04-12 NOTE — Progress Notes (Signed)
*  PRELIMINARY RESULTS* Echocardiogram 2D Echocardiogram has been performed.  Avanell Shackleton M 04/12/2014, 12:30 PM

## 2014-04-13 LAB — CBC WITH DIFFERENTIAL/PLATELET
Basophils Absolute: 0 10*3/uL (ref 0.0–0.1)
Basophils Relative: 0 % (ref 0–1)
Eosinophils Absolute: 0 10*3/uL (ref 0.0–0.7)
Eosinophils Relative: 0 % (ref 0–5)
HCT: 42.4 % (ref 36.0–46.0)
Hemoglobin: 13.8 g/dL (ref 12.0–15.0)
Lymphocytes Relative: 17 % (ref 12–46)
Lymphs Abs: 1.2 10*3/uL (ref 0.7–4.0)
MCH: 28.6 pg (ref 26.0–34.0)
MCHC: 32.5 g/dL (ref 30.0–36.0)
MCV: 88 fL (ref 78.0–100.0)
Monocytes Absolute: 0.3 10*3/uL (ref 0.1–1.0)
Monocytes Relative: 4 % (ref 3–12)
Neutro Abs: 5.5 10*3/uL (ref 1.7–7.7)
Neutrophils Relative %: 79 % — ABNORMAL HIGH (ref 43–77)
Platelets: 216 10*3/uL (ref 150–400)
RBC: 4.82 MIL/uL (ref 3.87–5.11)
RDW: 14.4 % (ref 11.5–15.5)
WBC: 7.1 10*3/uL (ref 4.0–10.5)

## 2014-04-13 LAB — COMPREHENSIVE METABOLIC PANEL
ALT: 15 U/L (ref 0–35)
AST: 16 U/L (ref 0–37)
Albumin: 3 g/dL — ABNORMAL LOW (ref 3.5–5.2)
Alkaline Phosphatase: 56 U/L (ref 39–117)
Anion gap: 9 (ref 5–15)
BUN: 27 mg/dL — ABNORMAL HIGH (ref 6–23)
CO2: 35 mEq/L — ABNORMAL HIGH (ref 19–32)
Calcium: 8.7 mg/dL (ref 8.4–10.5)
Chloride: 98 mEq/L (ref 96–112)
Creatinine, Ser: 0.89 mg/dL (ref 0.50–1.10)
GFR calc Af Amer: 79 mL/min — ABNORMAL LOW (ref >90)
GFR calc non Af Amer: 68 mL/min — ABNORMAL LOW (ref >90)
Glucose, Bld: 164 mg/dL — ABNORMAL HIGH (ref 70–99)
Potassium: 5.5 mEq/L — ABNORMAL HIGH (ref 3.7–5.3)
Sodium: 142 mEq/L (ref 137–147)
Total Bilirubin: 0.2 mg/dL — ABNORMAL LOW (ref 0.3–1.2)
Total Protein: 5.9 g/dL — ABNORMAL LOW (ref 6.0–8.3)

## 2014-04-13 LAB — GLUCOSE, CAPILLARY
Glucose-Capillary: 148 mg/dL — ABNORMAL HIGH (ref 70–99)
Glucose-Capillary: 159 mg/dL — ABNORMAL HIGH (ref 70–99)
Glucose-Capillary: 181 mg/dL — ABNORMAL HIGH (ref 70–99)
Glucose-Capillary: 143 mg/dL — ABNORMAL HIGH (ref 70–99)

## 2014-04-13 LAB — CULTURE, RESPIRATORY W GRAM STAIN: Culture: NORMAL

## 2014-04-13 NOTE — Progress Notes (Signed)
      [place] for Infectious Disease        [place] Antimicrobial Management Team Staphylococcus aureus bacteremia   Staphylococcus aureus bacteremia (SAB) is associated with a high rate of complications and mortality.  Specific aspects of clinical management are critical to optimizing the outcome of patients with SAB.  Therefore, the [facility] Antimicrobial Management Team [facility]) has initiated an intervention aimed at improving the management of SAB at [facility].  To do so, Infectious Diseases physicians are providing an evidence-based consult for the management of all patients with SAB.     Yes No Comments  Perform follow-up blood cultures (even if the patient is afebrile) to ensure clearance of bacteremia [x]  []  04/11/14 taken  Remove vascular catheter and obtain follow-up blood cultures after the removal of the catheter []  []  DO NOT PLACE PICC UNTIL WE HAVE GOT 72 HOURS OF NO GROWTH ON BLOOD CULTURES   Perform echocardiography to evaluate for endocarditis (transthoracic ECHO is 40-50% sensitive, TEE is > 90% sensitive) []  []  Please keep in mind, that neither test can definitively EXCLUDE endocarditis, and that should clinical suspicion remain high for endocarditis the patient should then still be treated with an "endocarditis" duration of therapy = 6 weeks TTE done results pending   Consult electrophysiologist to evaluate implanted cardiac device (pacemaker, ICD) []  []  NA  Ensure source control []  []  Have all abscesses been drained effectively? Have deep seeded infections (septic joints or osteomyelitis) had appropriate surgical debridement?  Not clear what source it to me other than possibly lungs   Investigate for "metastatic" sites of infection []  []  Does the patient have ANY symptom or physical exam finding that would suggest a deeper infection (back or neck pain that may be suggestive of vertebral osteomyelitis or epidural abscess, muscle pain that could be a symptom  of pyomyositis)?  Keep in mind that for deep seeded infections MRI imaging with contrast is preferred rather than other often insensitive tests such as plain x-rays, especially early in a patient's presentation.  Change antibiotic therapy to ANCEF 2G IV Q 8 HOURS []  []  Beta-lactam antibiotics are preferred for MSSA due to higher cure rates.   If on Vancomycin, goal trough should be 15 - 20 mcg/mL  Estimated duration of IV antibiotic therapy:  If no TEE then 4-6 weeks of IV ancef with day # 2 being 04/11/14 if cultures from that date still negative []  []  Consult case management for probably prolonged outpatient IV antibiotic therapy   Patient will need to followup at    [facility] for Infectious Disease [address] [facility] [place], [place], [address]  In the next 2-3 weeks (prior to 04/30/14)  Phone [phone] Fax: [phone]  Weekly CBC c diff , BMP should be faxed to Dr. Tommy Medal @ [phone]

## 2014-04-13 NOTE — Progress Notes (Signed)
   04/13/14 1100  Mobility  Activity Ambulate in hall  Level of Assistance Standby assist, set-up cues, supervision of patient - no hands on  Assistive Device Cane  Distance Ambulated (ft) 100 ft  Ambulation Response Tolerated well   O2 saturations:  O2 sats 95% at rest on room air. O2 sats 94% with activity on room air.  Patient is back to baseline. She states she only used 2L [place] at night prior to admission.

## 2014-04-13 NOTE — Progress Notes (Signed)
Subjective: Patient is restring. She is receiving IV antibiotics. Repeat blood culture is so far negative. Echo is pending. Objective: Vital signs in last 24 hours: Temp:  [98.3 F (36.8 C)-98.9 F (37.2 C)] 98.4 F (36.9 C) (12/06 0458) Pulse Rate:  [79-89] 87 (12/06 0458) Resp:  [18-20] 20 (12/06 0458) BP: (103-136)/(72-94) 136/94 mmHg (12/06 0458) SpO2:  [91 %-100 %] 97 % (12/06 0728) Weight change:  Last BM Date: 04/10/14  Intake/Output from previous day: 12/05 0701 - 12/06 0700 In: 1152 [P.O.:942; I.V.:110; IV Piggyback:100] Out: 950 [Urine:950]  PHYSICAL EXAM General appearance: alert and no distress Resp: diminished breath sounds bilaterally and rhonchi bilaterally Cardio: S1, S2 normal GI: soft, non-tender; bowel sounds normal; no masses,  no organomegaly Extremities: extremities normal, atraumatic, no cyanosis or edema  Lab Results:  Results for orders placed or performed during the hospital encounter of 04/07/14 (from the past 48 hour(s))  Culture, blood (routine x 2)     Status: None (Preliminary result)   Collection Time: 04/11/14 11:14 AM  Result Value Ref Range   Specimen Description RIGHT ANTECUBITAL    Special Requests BOTTLES DRAWN AEROBIC AND ANAEROBIC 8CC EACH    Culture NO GROWTH 2 DAYS    Report Status PENDING   Culture, blood (routine x 2)     Status: None (Preliminary result)   Collection Time: 04/11/14 11:20 AM  Result Value Ref Range   Specimen Description BLOOD RIGHT HAND    Special Requests BOTTLES DRAWN AEROBIC AND ANAEROBIC 6CC EACH    Culture NO GROWTH 2 DAYS    Report Status PENDING   Glucose, capillary     Status: Abnormal   Collection Time: 04/11/14 11:28 AM  Result Value Ref Range   Glucose-Capillary 182 (H) 70 - 99 mg/dL   Comment 1 Documented in Chart    Comment 2 Notify RN   Glucose, capillary     Status: Abnormal   Collection Time: 04/11/14  5:02 PM  Result Value Ref Range   Glucose-Capillary 136 (H) 70 - 99 mg/dL   Comment 1  Notify RN    Comment 2 Documented in Chart   Glucose, capillary     Status: Abnormal   Collection Time: 04/11/14  8:42 PM  Result Value Ref Range   Glucose-Capillary 137 (H) 70 - 99 mg/dL   Comment 1 Documented in Chart    Comment 2 Notify RN   CBC WITH DIFFERENTIAL     Status: Abnormal   Collection Time: 04/12/14  5:44 AM  Result Value Ref Range   WBC 6.4 4.0 - 10.5 K/uL   RBC 4.53 3.87 - 5.11 MIL/uL   Hemoglobin 12.9 12.0 - 15.0 g/dL   HCT 60.7 37.1 - 06.2 %   MCV 87.0 78.0 - 100.0 fL   MCH 28.5 26.0 - 34.0 pg   MCHC 32.7 30.0 - 36.0 g/dL   RDW 69.4 85.4 - 62.7 %   Platelets 209 150 - 400 K/uL   Neutrophils Relative % 79 (H) 43 - 77 %   Neutro Abs 5.0 1.7 - 7.7 K/uL   Lymphocytes Relative 17 12 - 46 %   Lymphs Abs 1.1 0.7 - 4.0 K/uL   Monocytes Relative 4 3 - 12 %   Monocytes Absolute 0.3 0.1 - 1.0 K/uL   Eosinophils Relative 0 0 - 5 %   Eosinophils Absolute 0.0 0.0 - 0.7 K/uL   Basophils Relative 0 0 - 1 %   Basophils Absolute 0.0 0.0 - 0.1 K/uL  Comprehensive metabolic panel     Status: Abnormal   Collection Time: 04/12/14  5:44 AM  Result Value Ref Range   Sodium 142 137 - 147 mEq/L   Potassium 4.8 3.7 - 5.3 mEq/L   Chloride 101 96 - 112 mEq/L   CO2 32 19 - 32 mEq/L   Glucose, Bld 172 (H) 70 - 99 mg/dL   BUN 24 (H) 6 - 23 mg/dL   Creatinine, Ser 1.00 0.50 - 1.10 mg/dL   Calcium 8.6 8.4 - 10.5 mg/dL   Total Protein 5.8 (L) 6.0 - 8.3 g/dL   Albumin 2.9 (L) 3.5 - 5.2 g/dL   AST 11 0 - 37 U/L   ALT 20 0 - 35 U/L   Alkaline Phosphatase 56 39 - 117 U/L   Total Bilirubin 0.2 (L) 0.3 - 1.2 mg/dL   GFR calc non Af Amer 59 (L) >90 mL/min   GFR calc Af Amer 68 (L) >90 mL/min    Comment: (NOTE) The eGFR has been calculated using the CKD EPI equation. This calculation has not been validated in all clinical situations. eGFR's persistently <90 mL/min signify possible Chronic Kidney Disease.    Anion gap 9 5 - 15  Glucose, capillary     Status: Abnormal   Collection  Time: 04/12/14  7:43 AM  Result Value Ref Range   Glucose-Capillary 148 (H) 70 - 99 mg/dL   Comment 1 Notify RN    Comment 2 Documented in Chart   Glucose, capillary     Status: Abnormal   Collection Time: 04/12/14 11:12 AM  Result Value Ref Range   Glucose-Capillary 237 (H) 70 - 99 mg/dL   Comment 1 Notify RN    Comment 2 Documented in Chart   Glucose, capillary     Status: Abnormal   Collection Time: 04/12/14  4:46 PM  Result Value Ref Range   Glucose-Capillary 153 (H) 70 - 99 mg/dL   Comment 1 Notify RN    Comment 2 Documented in Chart   Glucose, capillary     Status: Abnormal   Collection Time: 04/12/14  8:40 PM  Result Value Ref Range   Glucose-Capillary 151 (H) 70 - 99 mg/dL   Comment 1 Documented in Chart    Comment 2 Notify RN   CBC WITH DIFFERENTIAL     Status: Abnormal   Collection Time: 04/13/14  5:50 AM  Result Value Ref Range   WBC 7.1 4.0 - 10.5 K/uL   RBC 4.82 3.87 - 5.11 MIL/uL   Hemoglobin 13.8 12.0 - 15.0 g/dL   HCT 42.4 36.0 - 46.0 %   MCV 88.0 78.0 - 100.0 fL   MCH 28.6 26.0 - 34.0 pg   MCHC 32.5 30.0 - 36.0 g/dL   RDW 14.4 11.5 - 15.5 %   Platelets 216 150 - 400 K/uL   Neutrophils Relative % 79 (H) 43 - 77 %   Neutro Abs 5.5 1.7 - 7.7 K/uL   Lymphocytes Relative 17 12 - 46 %   Lymphs Abs 1.2 0.7 - 4.0 K/uL   Monocytes Relative 4 3 - 12 %   Monocytes Absolute 0.3 0.1 - 1.0 K/uL   Eosinophils Relative 0 0 - 5 %   Eosinophils Absolute 0.0 0.0 - 0.7 K/uL   Basophils Relative 0 0 - 1 %   Basophils Absolute 0.0 0.0 - 0.1 K/uL  Comprehensive metabolic panel     Status: Abnormal   Collection Time: 04/13/14  5:50 AM  Result Value  Ref Range   Sodium 142 137 - 147 mEq/L   Potassium 5.5 (H) 3.7 - 5.3 mEq/L   Chloride 98 96 - 112 mEq/L   CO2 35 (H) 19 - 32 mEq/L   Glucose, Bld 164 (H) 70 - 99 mg/dL   BUN 27 (H) 6 - 23 mg/dL   Creatinine, Ser 0.89 0.50 - 1.10 mg/dL   Calcium 8.7 8.4 - 10.5 mg/dL   Total Protein 5.9 (L) 6.0 - 8.3 g/dL   Albumin 3.0 (L)  3.5 - 5.2 g/dL   AST 16 0 - 37 U/L   ALT 15 0 - 35 U/L   Alkaline Phosphatase 56 39 - 117 U/L   Total Bilirubin 0.2 (L) 0.3 - 1.2 mg/dL   GFR calc non Af Amer 68 (L) >90 mL/min   GFR calc Af Amer 79 (L) >90 mL/min    Comment: (NOTE) The eGFR has been calculated using the CKD EPI equation. This calculation has not been validated in all clinical situations. eGFR's persistently <90 mL/min signify possible Chronic Kidney Disease.    Anion gap 9 5 - 15  Glucose, capillary     Status: Abnormal   Collection Time: 04/13/14  7:31 AM  Result Value Ref Range   Glucose-Capillary 148 (H) 70 - 99 mg/dL   Comment 1 Notify RN     ABGS No results for input(s): PHART, PO2ART, TCO2, HCO3 in the last 72 hours.  Invalid input(s): PCO2 CULTURES Recent Results (from the past 240 hour(s))  Culture, blood (routine x 2)     Status: None   Collection Time: 04/07/14  7:38 PM  Result Value Ref Range Status   Specimen Description BLOOD RIGHT ANTECUBITAL  Final   Special Requests BOTTLES DRAWN AEROBIC AND ANAEROBIC 6CC  Final   Culture NO GROWTH 5 DAYS  Final   Report Status 04/12/2014 FINAL  Final  Culture, blood (routine x 2)     Status: None   Collection Time: 04/07/14  7:45 PM  Result Value Ref Range Status   Specimen Description BLOOD LEFT ARM  Final   Special Requests BOTTLES DRAWN AEROBIC AND ANAEROBIC 6CC  Final   Culture  Setup Time   Final    04/08/2014 23:56 Performed at [facility]    Culture   Final    STAPHYLOCOCCUS AUREUS Note: RIFAMPIN AND GENTAMICIN SHOULD NOT BE USED AS SINGLE DRUGS FOR TREATMENT OF STAPH INFECTIONS. STAPHYLOCOCCUS SPECIES (COAGULASE NEGATIVE) Note: THE SIGNIFICANCE OF ISOLATING THIS ORGANISM FROM A SINGLE VENIPUNCTURE CANNOT BE PREDICTED WITHOUT FURTHER CLINICAL AND CULTURE CORRELATION. SUSCEPTIBILITIES AVAILABLE ONLY ON REQUEST. Note: Gram Stain Report Called to,Read Back By and Verified With: HORINE C AT 1203 ON 04/08/2014 BY BAUGHAM M. Performed at  [facility]    Report Status 04/11/2014 FINAL  Final   Organism ID, Bacteria STAPHYLOCOCCUS AUREUS  Final      Susceptibility   Staphylococcus aureus - MIC*    CLINDAMYCIN <=0.25 SENSITIVE Sensitive     ERYTHROMYCIN <=0.25 SENSITIVE Sensitive     GENTAMICIN <=0.5 SENSITIVE Sensitive     LEVOFLOXACIN <=0.12 SENSITIVE Sensitive     OXACILLIN 0.5 SENSITIVE Sensitive     PENICILLIN 0.06 SENSITIVE Sensitive     RIFAMPIN <=0.5 SENSITIVE Sensitive     TRIMETH/SULFA <=10 SENSITIVE Sensitive     VANCOMYCIN 1 SENSITIVE Sensitive     TETRACYCLINE <=1 SENSITIVE Sensitive     MOXIFLOXACIN <=0.25 SENSITIVE Sensitive     * STAPHYLOCOCCUS AUREUS  Culture, respiratory (NON-Expectorated)  Status: None (Preliminary result)   Collection Time: 04/10/14 11:00 AM  Result Value Ref Range Status   Specimen Description SPUTUM EXPECTORATED  Final   Special Requests NONE  Final   Gram Stain   Final    FEW WBC PRESENT,BOTH PMN AND MONONUCLEAR RARE SQUAMOUS EPITHELIAL CELLS PRESENT RARE GRAM POSITIVE COCCI IN PAIRS Performed at [facility]    Culture   Final    NORMAL OROPHARYNGEAL FLORA Performed at [facility]    Report Status PENDING  Incomplete  Culture, sputum-assessment     Status: None   Collection Time: 04/10/14 11:20 AM  Result Value Ref Range Status   Specimen Description SPUTUM EXPECTORATED  Final   Special Requests NONE  Final   Sputum evaluation   Final    THIS SPECIMEN IS ACCEPTABLE. RESPIRATORY CULTURE REPORT TO FOLLOW. Performed at [facility]    Report Status 04/10/2014 FINAL  Final  Culture, blood (routine x 2)     Status: None (Preliminary result)   Collection Time: 04/11/14 11:14 AM  Result Value Ref Range Status   Specimen Description RIGHT ANTECUBITAL  Final   Special Requests BOTTLES DRAWN AEROBIC AND ANAEROBIC 8CC EACH  Final   Culture NO GROWTH 2 DAYS  Final   Report Status PENDING  Incomplete  Culture, blood (routine x 2)      Status: None (Preliminary result)   Collection Time: 04/11/14 11:20 AM  Result Value Ref Range Status   Specimen Description BLOOD RIGHT HAND  Final   Special Requests BOTTLES DRAWN AEROBIC AND ANAEROBIC [place]  Final   Culture NO GROWTH 2 DAYS  Final   Report Status PENDING  Incomplete   Studies/Results: No results found.  Medications: I have reviewed the patient's current medications.  Assesment:   Principal Problem:   CAP (community acquired pneumonia) Active Problems:   Ischemic cardiomyopathy   HTN (hypertension)   Tobacco abuse   Anxiety disorder   CAD (coronary artery disease)   Systolic CHF, chronic   Diabetes   Acute respiratory failure with hypoxia   COPD exacerbation    Plan:  Medications reviewed Will continue Ancef as per the recommendation Will follow echo result. Continue regular treatment    LOS: 6 days   Kristin Wells 04/13/2014, 10:25 AM

## 2014-04-14 LAB — GLUCOSE, CAPILLARY
Glucose-Capillary: 122 mg/dL — ABNORMAL HIGH (ref 70–99)
Glucose-Capillary: 152 mg/dL — ABNORMAL HIGH (ref 70–99)
Glucose-Capillary: 178 mg/dL — ABNORMAL HIGH (ref 70–99)
Glucose-Capillary: 163 mg/dL — ABNORMAL HIGH (ref 70–99)

## 2014-04-14 LAB — CBC WITH DIFFERENTIAL/PLATELET
Basophils Absolute: 0 10*3/uL (ref 0.0–0.1)
Basophils Relative: 0 % (ref 0–1)
Eosinophils Absolute: 0 10*3/uL (ref 0.0–0.7)
Eosinophils Relative: 0 % (ref 0–5)
HCT: 43.3 % (ref 36.0–46.0)
Hemoglobin: 14.2 g/dL (ref 12.0–15.0)
Lymphocytes Relative: 16 % (ref 12–46)
Lymphs Abs: 1.4 10*3/uL (ref 0.7–4.0)
MCH: 28.7 pg (ref 26.0–34.0)
MCHC: 32.8 g/dL (ref 30.0–36.0)
MCV: 87.7 fL (ref 78.0–100.0)
Monocytes Absolute: 0.3 10*3/uL (ref 0.1–1.0)
Monocytes Relative: 4 % (ref 3–12)
Neutro Abs: 6.6 10*3/uL (ref 1.7–7.7)
Neutrophils Relative %: 80 % — ABNORMAL HIGH (ref 43–77)
Platelets: 220 10*3/uL (ref 150–400)
RBC: 4.94 MIL/uL (ref 3.87–5.11)
RDW: 14.5 % (ref 11.5–15.5)
WBC: 8.3 10*3/uL (ref 4.0–10.5)

## 2014-04-14 LAB — CREATININE, SERUM
Creatinine, Ser: 0.87 mg/dL (ref 0.50–1.10)
GFR calc Af Amer: 81 mL/min — ABNORMAL LOW (ref >90)
GFR calc non Af Amer: 70 mL/min — ABNORMAL LOW (ref >90)

## 2014-04-14 NOTE — Care Management Utilization Note (Signed)
UR complete 

## 2014-04-14 NOTE — Progress Notes (Signed)
      [place] for Infectious Disease        [place] Antimicrobial Management Team Staphylococcus aureus bacteremia   Staphylococcus aureus bacteremia (SAB) is associated with a high rate of complications and mortality.  Specific aspects of clinical management are critical to optimizing the outcome of patients with SAB.  Therefore, the [facility] Antimicrobial Management Team [facility]) has initiated an intervention aimed at improving the management of SAB at [facility].  To do so, Infectious Diseases physicians are providing an evidence-based consult for the management of all patients with SAB.     Yes No Comments  Perform follow-up blood cultures (even if the patient is afebrile) to ensure clearance of bacteremia [x]  []  04/11/14 taken and NO GROWTH TO DATE  Remove vascular catheter and obtain follow-up blood cultures after the removal of the catheter []  []  OK TO PLACE PICC later today or tomorrow if blood cultures still with NGTD   Perform echocardiography to evaluate for endocarditis (transthoracic ECHO is 40-50% sensitive, TEE is > 90% sensitive) []  []  Please keep in mind, that neither test can definitively EXCLUDE endocarditis, and that should clinical suspicion remain high for endocarditis the patient should then still be treated with an "endocarditis" duration of therapy = 6 weeks TTE  No vegetations  Consult electrophysiologist to evaluate implanted cardiac device (pacemaker, ICD) []  []  NA  Ensure source control []  []  Have all abscesses been drained effectively? Have deep seeded infections (septic joints or osteomyelitis) had appropriate surgical debridement?  Not clear what source it to me other than possibly lungs   Investigate for "metastatic" sites of infection []  []  Does the patient have ANY symptom or physical exam finding that would suggest a deeper infection (back or neck pain that may be suggestive of vertebral osteomyelitis or epidural abscess, muscle pain that could  be a symptom of pyomyositis)?  Keep in mind that for deep seeded infections MRI imaging with contrast is preferred rather than other often insensitive tests such as plain x-rays, especially early in a patient's presentation.  Change antibiotic therapy to ANCEF 2G IV Q 8 HOURS []  []  Beta-lactam antibiotics are preferred for MSSA due to higher cure rates.   If on Vancomycin, goal trough should be 15 - 20 mcg/mL  Estimated duration of IV antibiotic therapy:   4-6 weeks of IV ancef with day # 2 being 04/11/14 if cultures from that date still negative []  []  Consult case management for probably prolonged outpatient IV antibiotic therapy   Patient will need to followup at    [facility] for Infectious Disease [address] [facility] [place], [place], [address]  In the next 2-3 weeks (prior to 04/30/14)  Phone [phone] Fax: [phone]  Weekly CBC c diff , BMP should be faxed to Dr. Tommy Medal @ [phone]

## 2014-04-14 NOTE — Progress Notes (Signed)
Subjective: Patient is receiving iv Ancef. No new complaint. Repeat culture is so far negative. Objective: Vital signs in last 24 hours: Temp:  [98.3 F (36.8 C)-98.9 F (37.2 C)] 98.4 F (36.9 C) (12/07 0609) Pulse Rate:  [72-88] 72 (12/07 0609) Resp:  [20] 20 (12/07 0609) BP: (96-109)/(51-75) 109/75 mmHg (12/07 0609) SpO2:  [92 %-98 %] 98 % (12/07 0720) Weight change:  Last BM Date: 04/13/14 (small BM per patient)  Intake/Output from previous day: 12/06 0701 - 12/07 0700 In: 720 [P.O.:720] Out: -   PHYSICAL EXAM General appearance: alert and no distress Resp: diminished breath sounds bilaterally and rhonchi bilaterally Cardio: S1, S2 normal GI: soft, non-tender; bowel sounds normal; no masses,  no organomegaly Extremities: extremities normal, atraumatic, no cyanosis or edema  Lab Results:  Results for orders placed or performed during the hospital encounter of 04/07/14 (from the past 48 hour(s))  Glucose, capillary     Status: Abnormal   Collection Time: 04/12/14 11:12 AM  Result Value Ref Range   Glucose-Capillary 237 (H) 70 - 99 mg/dL   Comment 1 Notify RN    Comment 2 Documented in Chart   Glucose, capillary     Status: Abnormal   Collection Time: 04/12/14  4:46 PM  Result Value Ref Range   Glucose-Capillary 153 (H) 70 - 99 mg/dL   Comment 1 Notify RN    Comment 2 Documented in Chart   Glucose, capillary     Status: Abnormal   Collection Time: 04/12/14  8:40 PM  Result Value Ref Range   Glucose-Capillary 151 (H) 70 - 99 mg/dL   Comment 1 Documented in Chart    Comment 2 Notify RN   CBC WITH DIFFERENTIAL     Status: Abnormal   Collection Time: 04/13/14  5:50 AM  Result Value Ref Range   WBC 7.1 4.0 - 10.5 K/uL   RBC 4.82 3.87 - 5.11 MIL/uL   Hemoglobin 13.8 12.0 - 15.0 g/dL   HCT 42.4 36.0 - 46.0 %   MCV 88.0 78.0 - 100.0 fL   MCH 28.6 26.0 - 34.0 pg   MCHC 32.5 30.0 - 36.0 g/dL   RDW 14.4 11.5 - 15.5 %   Platelets 216 150 - 400 K/uL   Neutrophils  Relative % 79 (H) 43 - 77 %   Neutro Abs 5.5 1.7 - 7.7 K/uL   Lymphocytes Relative 17 12 - 46 %   Lymphs Abs 1.2 0.7 - 4.0 K/uL   Monocytes Relative 4 3 - 12 %   Monocytes Absolute 0.3 0.1 - 1.0 K/uL   Eosinophils Relative 0 0 - 5 %   Eosinophils Absolute 0.0 0.0 - 0.7 K/uL   Basophils Relative 0 0 - 1 %   Basophils Absolute 0.0 0.0 - 0.1 K/uL  Comprehensive metabolic panel     Status: Abnormal   Collection Time: 04/13/14  5:50 AM  Result Value Ref Range   Sodium 142 137 - 147 mEq/L   Potassium 5.5 (H) 3.7 - 5.3 mEq/L   Chloride 98 96 - 112 mEq/L   CO2 35 (H) 19 - 32 mEq/L   Glucose, Bld 164 (H) 70 - 99 mg/dL   BUN 27 (H) 6 - 23 mg/dL   Creatinine, Ser 0.89 0.50 - 1.10 mg/dL   Calcium 8.7 8.4 - 10.5 mg/dL   Total Protein 5.9 (L) 6.0 - 8.3 g/dL   Albumin 3.0 (L) 3.5 - 5.2 g/dL   AST 16 0 - 37 U/L  ALT 15 0 - 35 U/L   Alkaline Phosphatase 56 39 - 117 U/L   Total Bilirubin 0.2 (L) 0.3 - 1.2 mg/dL   GFR calc non Af Amer 68 (L) >90 mL/min   GFR calc Af Amer 79 (L) >90 mL/min    Comment: (NOTE) The eGFR has been calculated using the CKD EPI equation. This calculation has not been validated in all clinical situations. eGFR's persistently <90 mL/min signify possible Chronic Kidney Disease.    Anion gap 9 5 - 15  Glucose, capillary     Status: Abnormal   Collection Time: 04/13/14  7:31 AM  Result Value Ref Range   Glucose-Capillary 148 (H) 70 - 99 mg/dL   Comment 1 Notify RN   Glucose, capillary     Status: Abnormal   Collection Time: 04/13/14 11:42 AM  Result Value Ref Range   Glucose-Capillary 159 (H) 70 - 99 mg/dL   Comment 1 Notify RN   Glucose, capillary     Status: Abnormal   Collection Time: 04/13/14  5:23 PM  Result Value Ref Range   Glucose-Capillary 181 (H) 70 - 99 mg/dL   Comment 1 Notify RN   Glucose, capillary     Status: Abnormal   Collection Time: 04/13/14  8:22 PM  Result Value Ref Range   Glucose-Capillary 143 (H) 70 - 99 mg/dL   Comment 1 Documented in  Chart    Comment 2 Notify RN   Creatinine, serum     Status: Abnormal   Collection Time: 04/14/14  5:42 AM  Result Value Ref Range   Creatinine, Ser 0.87 0.50 - 1.10 mg/dL   GFR calc non Af Amer 70 (L) >90 mL/min   GFR calc Af Amer 81 (L) >90 mL/min    Comment: (NOTE) The eGFR has been calculated using the CKD EPI equation. This calculation has not been validated in all clinical situations. eGFR's persistently <90 mL/min signify possible Chronic Kidney Disease.   CBC WITH DIFFERENTIAL     Status: Abnormal   Collection Time: 04/14/14  5:42 AM  Result Value Ref Range   WBC 8.3 4.0 - 10.5 K/uL   RBC 4.94 3.87 - 5.11 MIL/uL   Hemoglobin 14.2 12.0 - 15.0 g/dL   HCT 43.3 36.0 - 46.0 %   MCV 87.7 78.0 - 100.0 fL   MCH 28.7 26.0 - 34.0 pg   MCHC 32.8 30.0 - 36.0 g/dL   RDW 14.5 11.5 - 15.5 %   Platelets 220 150 - 400 K/uL   Neutrophils Relative % 80 (H) 43 - 77 %   Neutro Abs 6.6 1.7 - 7.7 K/uL   Lymphocytes Relative 16 12 - 46 %   Lymphs Abs 1.4 0.7 - 4.0 K/uL   Monocytes Relative 4 3 - 12 %   Monocytes Absolute 0.3 0.1 - 1.0 K/uL   Eosinophils Relative 0 0 - 5 %   Eosinophils Absolute 0.0 0.0 - 0.7 K/uL   Basophils Relative 0 0 - 1 %   Basophils Absolute 0.0 0.0 - 0.1 K/uL    ABGS No results for input(s): PHART, PO2ART, TCO2, HCO3 in the last 72 hours.  Invalid input(s): PCO2 CULTURES Recent Results (from the past 240 hour(s))  Culture, blood (routine x 2)     Status: None   Collection Time: 04/07/14  7:38 PM  Result Value Ref Range Status   Specimen Description BLOOD RIGHT ANTECUBITAL  Final   Special Requests BOTTLES DRAWN AEROBIC AND ANAEROBIC [place]  Final  Culture NO GROWTH 5 DAYS  Final   Report Status 04/12/2014 FINAL  Final  Culture, blood (routine x 2)     Status: None   Collection Time: 04/07/14  7:45 PM  Result Value Ref Range Status   Specimen Description BLOOD LEFT ARM  Final   Special Requests BOTTLES DRAWN AEROBIC AND ANAEROBIC [facility]  Final   Culture   Setup Time   Final    04/08/2014 23:56 Performed at [facility]    Culture   Final    STAPHYLOCOCCUS AUREUS Note: RIFAMPIN AND GENTAMICIN SHOULD NOT BE USED AS SINGLE DRUGS FOR TREATMENT OF STAPH INFECTIONS. STAPHYLOCOCCUS SPECIES (COAGULASE NEGATIVE) Note: THE SIGNIFICANCE OF ISOLATING THIS ORGANISM FROM A SINGLE VENIPUNCTURE CANNOT BE PREDICTED WITHOUT FURTHER CLINICAL AND CULTURE CORRELATION. SUSCEPTIBILITIES AVAILABLE ONLY ON REQUEST. Note: Gram Stain Report Called to,Read Back By and Verified With: HORINE C AT 1203 ON 04/08/2014 BY BAUGHAM M. Performed at [facility]    Report Status 04/11/2014 FINAL  Final   Organism ID, Bacteria STAPHYLOCOCCUS AUREUS  Final      Susceptibility   Staphylococcus aureus - MIC*    CLINDAMYCIN <=0.25 SENSITIVE Sensitive     ERYTHROMYCIN <=0.25 SENSITIVE Sensitive     GENTAMICIN <=0.5 SENSITIVE Sensitive     LEVOFLOXACIN <=0.12 SENSITIVE Sensitive     OXACILLIN 0.5 SENSITIVE Sensitive     PENICILLIN 0.06 SENSITIVE Sensitive     RIFAMPIN <=0.5 SENSITIVE Sensitive     TRIMETH/SULFA <=10 SENSITIVE Sensitive     VANCOMYCIN 1 SENSITIVE Sensitive     TETRACYCLINE <=1 SENSITIVE Sensitive     MOXIFLOXACIN <=0.25 SENSITIVE Sensitive     * STAPHYLOCOCCUS AUREUS  Culture, respiratory (NON-Expectorated)     Status: None   Collection Time: 04/10/14 11:00 AM  Result Value Ref Range Status   Specimen Description SPUTUM EXPECTORATED  Final   Special Requests NONE  Final   Gram Stain   Final    FEW WBC PRESENT,BOTH PMN AND MONONUCLEAR RARE SQUAMOUS EPITHELIAL CELLS PRESENT RARE GRAM POSITIVE COCCI IN PAIRS Performed at [facility]    Culture   Final    NORMAL OROPHARYNGEAL FLORA Performed at [facility]    Report Status 04/13/2014 FINAL  Final  Culture, sputum-assessment     Status: None   Collection Time: 04/10/14 11:20 AM  Result Value Ref Range Status   Specimen Description SPUTUM EXPECTORATED  Final   Special  Requests NONE  Final   Sputum evaluation   Final    THIS SPECIMEN IS ACCEPTABLE. RESPIRATORY CULTURE REPORT TO FOLLOW. Performed at [facility]    Report Status 04/10/2014 FINAL  Final  Culture, blood (routine x 2)     Status: None (Preliminary result)   Collection Time: 04/11/14 11:14 AM  Result Value Ref Range Status   Specimen Description RIGHT ANTECUBITAL  Final   Special Requests BOTTLES DRAWN AEROBIC AND ANAEROBIC 8CC EACH  Final   Culture NO GROWTH 2 DAYS  Final   Report Status PENDING  Incomplete  Culture, blood (routine x 2)     Status: None (Preliminary result)   Collection Time: 04/11/14 11:20 AM  Result Value Ref Range Status   Specimen Description BLOOD RIGHT HAND  Final   Special Requests BOTTLES DRAWN AEROBIC AND ANAEROBIC [place]  Final   Culture NO GROWTH 2 DAYS  Final   Report Status PENDING  Incomplete   Studies/Results: No results found.  Medications: I have reviewed the patient's current medications.  Assesment:   Principal Problem:   CAP (community acquired pneumonia) Active Problems:   Ischemic cardiomyopathy   HTN (hypertension)   Tobacco abuse   Anxiety disorder   CAD (coronary artery disease)   Systolic CHF, chronic   Diabetes   Acute respiratory failure with hypoxia   COPD exacerbation    Plan:  Medications reviewed Will continue Ancef as per the recommendation Will follow echo result. Continue regular treatment    LOS: 7 days   Maili Shutters 04/14/2014, 7:55 AM

## 2014-04-14 NOTE — Progress Notes (Signed)
ANTIBIOTIC CONSULT NOTE - INITIAL  Pharmacy Consult for Ancef Indication: bacteremia  No Known Allergies  Patient Measurements: Height: 5\' 1"  (154.9 cm) Weight: 139 lb 12.8 oz (63.413 kg) IBW/kg (Calculated) : 47.8  Vital Signs: Temp: 98.4 F (36.9 C) (12/07 0609) Temp Source: Oral (12/07 0609) BP: 109/75 mmHg (12/07 0609) Pulse Rate: 72 (12/07 0609) Intake/Output from previous day: 12/06 0701 - 12/07 0700 In: 720 [P.O.:720] Out: -  Intake/Output from this shift: Total I/O In: 641.5 [I.V.:391.5; IV Piggyback:250] Out: 200 [Urine:200]  Labs:  Recent Labs  04/12/14 0544 04/13/14 0550 04/14/14 0542  WBC 6.4 7.1 8.3  HGB 12.9 13.8 14.2  PLT 209 216 220  CREATININE 1.00 0.89 0.87   Estimated Creatinine Clearance: 57.2 mL/min (by C-G formula based on Cr of 0.87). No results for input(s): VANCOTROUGH, VANCOPEAK, VANCORANDOM, GENTTROUGH, GENTPEAK, GENTRANDOM, TOBRATROUGH, TOBRAPEAK, TOBRARND, AMIKACINPEAK, AMIKACINTROU, AMIKACIN in the last 72 hours.   Microbiology: Recent Results (from the past 720 hour(s))  Culture, blood (routine x 2)     Status: None   Collection Time: 04/07/14  7:38 PM  Result Value Ref Range Status   Specimen Description BLOOD RIGHT ANTECUBITAL  Final   Special Requests BOTTLES DRAWN AEROBIC AND ANAEROBIC 6CC  Final   Culture NO GROWTH 5 DAYS  Final   Report Status 04/12/2014 FINAL  Final  Culture, blood (routine x 2)     Status: None   Collection Time: 04/07/14  7:45 PM  Result Value Ref Range Status   Specimen Description BLOOD LEFT ARM  Final   Special Requests BOTTLES DRAWN AEROBIC AND ANAEROBIC 6CC  Final   Culture  Setup Time   Final    04/08/2014 23:56 Performed at [facility]    Culture   Final    STAPHYLOCOCCUS AUREUS Note: RIFAMPIN AND GENTAMICIN SHOULD NOT BE USED AS SINGLE DRUGS FOR TREATMENT OF STAPH INFECTIONS. STAPHYLOCOCCUS SPECIES (COAGULASE NEGATIVE) Note: THE SIGNIFICANCE OF ISOLATING THIS ORGANISM FROM A  SINGLE VENIPUNCTURE CANNOT BE PREDICTED WITHOUT FURTHER CLINICAL AND CULTURE CORRELATION. SUSCEPTIBILITIES AVAILABLE ONLY ON REQUEST. Note: Gram Stain Report Called to,Read Back By and Verified With: HORINE C AT 1203 ON 04/08/2014 BY BAUGHAM M. Performed at [facility]    Report Status 04/11/2014 FINAL  Final   Organism ID, Bacteria STAPHYLOCOCCUS AUREUS  Final      Susceptibility   Staphylococcus aureus - MIC*    CLINDAMYCIN <=0.25 SENSITIVE Sensitive     ERYTHROMYCIN <=0.25 SENSITIVE Sensitive     GENTAMICIN <=0.5 SENSITIVE Sensitive     LEVOFLOXACIN <=0.12 SENSITIVE Sensitive     OXACILLIN 0.5 SENSITIVE Sensitive     PENICILLIN 0.06 SENSITIVE Sensitive     RIFAMPIN <=0.5 SENSITIVE Sensitive     TRIMETH/SULFA <=10 SENSITIVE Sensitive     VANCOMYCIN 1 SENSITIVE Sensitive     TETRACYCLINE <=1 SENSITIVE Sensitive     MOXIFLOXACIN <=0.25 SENSITIVE Sensitive     * STAPHYLOCOCCUS AUREUS  Culture, respiratory (NON-Expectorated)     Status: None   Collection Time: 04/10/14 11:00 AM  Result Value Ref Range Status   Specimen Description SPUTUM EXPECTORATED  Final   Special Requests NONE  Final   Gram Stain   Final    FEW WBC PRESENT,BOTH PMN AND MONONUCLEAR RARE SQUAMOUS EPITHELIAL CELLS PRESENT RARE GRAM POSITIVE COCCI IN PAIRS Performed at [facility]    Culture   Final    NORMAL OROPHARYNGEAL FLORA Performed at [facility]    Report Status 04/13/2014 FINAL  Final  Culture, sputum-assessment     Status: None   Collection Time: 04/10/14 11:20 AM  Result Value Ref Range Status   Specimen Description SPUTUM EXPECTORATED  Final   Special Requests NONE  Final   Sputum evaluation   Final    THIS SPECIMEN IS ACCEPTABLE. RESPIRATORY CULTURE REPORT TO FOLLOW. Performed at [facility]    Report Status 04/10/2014 FINAL  Final  Culture, blood (routine x 2)     Status: None (Preliminary result)   Collection Time: 04/11/14 11:14 AM  Result Value Ref Range  Status   Specimen Description RIGHT ANTECUBITAL  Final   Special Requests BOTTLES DRAWN AEROBIC AND ANAEROBIC 8CC EACH  Final   Culture NO GROWTH 3 DAYS  Final   Report Status PENDING  Incomplete  Culture, blood (routine x 2)     Status: None (Preliminary result)   Collection Time: 04/11/14 11:20 AM  Result Value Ref Range Status   Specimen Description BLOOD RIGHT HAND  Final   Special Requests BOTTLES DRAWN AEROBIC AND ANAEROBIC 6CC EACH  Final   Culture NO GROWTH 3 DAYS  Final   Report Status PENDING  Incomplete    Medical History: Past Medical History  Diagnosis Date  . COPD (chronic obstructive pulmonary disease)   . Asthma   . Bronchitis   . Anxiety   . Back pain   . Demand ischemia of myocardium 06/28/2011  . Thyroid enlargement 06/28/2011  . MI, acute, non ST segment elevation 06/29/2011    Myoview stress test revealed mild perfusion defect  . Ischemic cardiomyopathy 06/28/2011    EF 25%. Myoview stress test later showed ejection fraction of 65%  . PNA (pneumonia) 06/29/2011  . Acute respiratory failure 06/2011    Vent dependent sec to COPD/PNA  . Hypertension   . Coronary artery disease   . Myocardial infarction   . Pneumonia   . Diabetes mellitus without complication   . Arthritis   . Rectocele 10/09/2012  . CHF (congestive heart failure)     Medications:  Scheduled:  . acidophilus  1 capsule Oral Daily  . busPIRone  10 mg Oral TID  .  ceFAZolin (ANCEF) IV  2 g Intravenous 3 times per day  . enoxaparin (LOVENOX) injection  40 mg Subcutaneous Q24H  . gabapentin  300 mg Oral TID  . guaiFENesin  1,200 mg Oral BID  . HYDROcodone-acetaminophen  1 tablet Oral BID  . insulin aspart  0-20 Units Subcutaneous TID WC  . insulin aspart  0-5 Units Subcutaneous QHS  . ipratropium-albuterol  3 mL Nebulization Q4H  . levothyroxine  25 mcg Oral QAC breakfast  . loratadine  10 mg Oral Daily  . methylPREDNISolone (SOLU-MEDROL) injection  40 mg Intravenous Q12H  . nicotine  14 mg  Transdermal Daily  . pantoprazole  40 mg Oral Daily  . PARoxetine  30 mg Oral Daily  . pneumococcal 23 valent vaccine  0.5 mL Intramuscular Tomorrow-1000  . polyethylene glycol  17 g Oral Daily  . rosuvastatin  10 mg Oral q1800  . tiotropium  18 mcg Inhalation Daily  . Vitamin D (Ergocalciferol)  50,000 Units Oral Q7 days   Assessment: 27 yoF with +MSSA bacteremia.  She was admitted on 11/30 with COPD exacerbation & CXR + PNA.   She is currently afebrile with normal WBC.   Repeat blood cx negative x 3 days.  TTE results pending.  Renal function improved.  ID recommendations noted.   Levaquin 11/20>>12/4 Vancomycin 12/1>>12/4  Ancef 12/4>>  Goal of Therapy:  Eradicate infection.  Plan:  Ancef 2gm IV q8h Monitor renal function and cx data  Duration of therapy per MD--> patient will need at least 4 weeks of IV Ancef from 04/11/14 per ID recommendations  Kaitlynne Wenz, [place]onia Drafts 04/14/2014,10:42 AM

## 2014-04-15 ENCOUNTER — Inpatient Hospital Stay (HOSPITAL_COMMUNITY): Payer: PRIVATE HEALTH INSURANCE

## 2014-04-15 ENCOUNTER — Encounter (HOSPITAL_COMMUNITY): Payer: PRIVATE HEALTH INSURANCE

## 2014-04-15 LAB — CBC WITH DIFFERENTIAL/PLATELET
Basophils Absolute: 0 10*3/uL (ref 0.0–0.1)
Basophils Relative: 0 % (ref 0–1)
Eosinophils Absolute: 0 10*3/uL (ref 0.0–0.7)
Eosinophils Relative: 0 % (ref 0–5)
HCT: 42.3 % (ref 36.0–46.0)
Hemoglobin: 13.5 g/dL (ref 12.0–15.0)
Lymphocytes Relative: 11 % — ABNORMAL LOW (ref 12–46)
Lymphs Abs: 0.9 10*3/uL (ref 0.7–4.0)
MCH: 27.9 pg (ref 26.0–34.0)
MCHC: 31.9 g/dL (ref 30.0–36.0)
MCV: 87.4 fL (ref 78.0–100.0)
Monocytes Absolute: 0.3 10*3/uL (ref 0.1–1.0)
Monocytes Relative: 4 % (ref 3–12)
Neutro Abs: 7.4 10*3/uL (ref 1.7–7.7)
Neutrophils Relative %: 85 % — ABNORMAL HIGH (ref 43–77)
Platelets: 213 10*3/uL (ref 150–400)
RBC: 4.84 MIL/uL (ref 3.87–5.11)
RDW: 14.5 % (ref 11.5–15.5)
WBC: 8.6 10*3/uL (ref 4.0–10.5)

## 2014-04-15 LAB — GLUCOSE, CAPILLARY
Glucose-Capillary: 147 mg/dL — ABNORMAL HIGH (ref 70–99)
Glucose-Capillary: 170 mg/dL — ABNORMAL HIGH (ref 70–99)
Glucose-Capillary: 202 mg/dL — ABNORMAL HIGH (ref 70–99)
Glucose-Capillary: 181 mg/dL — ABNORMAL HIGH (ref 70–99)

## 2014-04-15 MED ORDER — CEFAZOLIN SODIUM-DEXTROSE 2-3 GM-% IV SOLR: 2.0000 g | Freq: Three times a day (TID) | INTRAVENOUS | Status: DC

## 2014-04-15 NOTE — Progress Notes (Signed)
Subjective: She says she feels well and wants to go home.  Objective: Vital signs in last 24 hours: Temp:  [98.5 F (36.9 C)-99.1 F (37.3 C)] 98.7 F (37.1 C) (12/08 0439) Pulse Rate:  [69-91] 69 (12/08 0439) Resp:  [20] 20 (12/08 0439) BP: (90-108)/(59-67) 105/67 mmHg (12/08 0439) SpO2:  [94 %-99 %] 97 % (12/08 0653) FiO2 (%):  [21 %] 21 % (12/07 1941) Weight change:  Last BM Date: 04/13/14  Intake/Output from previous day: 12/07 0701 - 12/08 0700 In: 1620 [P.O.:600; I.V.:620; IV Piggyback:400] Out: 700 [Urine:700]  PHYSICAL EXAM General appearance: alert, cooperative and no distress Resp: clear to auscultation bilaterally Cardio: regular rate and rhythm, S1, S2 normal, no murmur, click, rub or gallop GI: soft, non-tender; bowel sounds normal; no masses,  no organomegaly Extremities: extremities normal, atraumatic, no cyanosis or edema  Lab Results:  Results for orders placed or performed during the hospital encounter of 04/07/14 (from the past 48 hour(s))  Glucose, capillary     Status: Abnormal   Collection Time: 04/13/14 11:42 AM  Result Value Ref Range   Glucose-Capillary 159 (H) 70 - 99 mg/dL   Comment 1 Notify RN   Glucose, capillary     Status: Abnormal   Collection Time: 04/13/14  5:23 PM  Result Value Ref Range   Glucose-Capillary 181 (H) 70 - 99 mg/dL   Comment 1 Notify RN   Glucose, capillary     Status: Abnormal   Collection Time: 04/13/14  8:22 PM  Result Value Ref Range   Glucose-Capillary 143 (H) 70 - 99 mg/dL   Comment 1 Documented in Chart    Comment 2 Notify RN   Creatinine, serum     Status: Abnormal   Collection Time: 04/14/14  5:42 AM  Result Value Ref Range   Creatinine, Ser 0.87 0.50 - 1.10 mg/dL   GFR calc non Af Amer 70 (L) >90 mL/min   GFR calc Af Amer 81 (L) >90 mL/min    Comment: (NOTE) The eGFR has been calculated using the CKD EPI equation. This calculation has not been validated in all clinical situations. eGFR's persistently  <90 mL/min signify possible Chronic Kidney Disease.   CBC WITH DIFFERENTIAL     Status: Abnormal   Collection Time: 04/14/14  5:42 AM  Result Value Ref Range   WBC 8.3 4.0 - 10.5 K/uL   RBC 4.94 3.87 - 5.11 MIL/uL   Hemoglobin 14.2 12.0 - 15.0 g/dL   HCT 43.3 36.0 - 46.0 %   MCV 87.7 78.0 - 100.0 fL   MCH 28.7 26.0 - 34.0 pg   MCHC 32.8 30.0 - 36.0 g/dL   RDW 14.5 11.5 - 15.5 %   Platelets 220 150 - 400 K/uL   Neutrophils Relative % 80 (H) 43 - 77 %   Neutro Abs 6.6 1.7 - 7.7 K/uL   Lymphocytes Relative 16 12 - 46 %   Lymphs Abs 1.4 0.7 - 4.0 K/uL   Monocytes Relative 4 3 - 12 %   Monocytes Absolute 0.3 0.1 - 1.0 K/uL   Eosinophils Relative 0 0 - 5 %   Eosinophils Absolute 0.0 0.0 - 0.7 K/uL   Basophils Relative 0 0 - 1 %   Basophils Absolute 0.0 0.0 - 0.1 K/uL  Glucose, capillary     Status: Abnormal   Collection Time: 04/14/14  7:47 AM  Result Value Ref Range   Glucose-Capillary 122 (H) 70 - 99 mg/dL   Comment 1 Documented in Chart  Glucose, capillary     Status: Abnormal   Collection Time: 04/14/14 11:40 AM  Result Value Ref Range   Glucose-Capillary 152 (H) 70 - 99 mg/dL   Comment 1 Notify RN   Glucose, capillary     Status: Abnormal   Collection Time: 04/14/14  4:31 PM  Result Value Ref Range   Glucose-Capillary 178 (H) 70 - 99 mg/dL   Comment 1 Notify RN   Glucose, capillary     Status: Abnormal   Collection Time: 04/14/14  8:16 PM  Result Value Ref Range   Glucose-Capillary 163 (H) 70 - 99 mg/dL   Comment 1 Notify RN   CBC WITH DIFFERENTIAL     Status: Abnormal   Collection Time: 04/15/14  5:29 AM  Result Value Ref Range   WBC 8.6 4.0 - 10.5 K/uL   RBC 4.84 3.87 - 5.11 MIL/uL   Hemoglobin 13.5 12.0 - 15.0 g/dL   HCT 42.3 36.0 - 46.0 %   MCV 87.4 78.0 - 100.0 fL   MCH 27.9 26.0 - 34.0 pg   MCHC 31.9 30.0 - 36.0 g/dL   RDW 14.5 11.5 - 15.5 %   Platelets 213 150 - 400 K/uL   Neutrophils Relative % 85 (H) 43 - 77 %   Neutro Abs 7.4 1.7 - 7.7 K/uL    Lymphocytes Relative 11 (L) 12 - 46 %   Lymphs Abs 0.9 0.7 - 4.0 K/uL   Monocytes Relative 4 3 - 12 %   Monocytes Absolute 0.3 0.1 - 1.0 K/uL   Eosinophils Relative 0 0 - 5 %   Eosinophils Absolute 0.0 0.0 - 0.7 K/uL   Basophils Relative 0 0 - 1 %   Basophils Absolute 0.0 0.0 - 0.1 K/uL  Glucose, capillary     Status: Abnormal   Collection Time: 04/15/14  7:24 AM  Result Value Ref Range   Glucose-Capillary 147 (H) 70 - 99 mg/dL   Comment 1 Notify RN     ABGS No results for input(s): PHART, PO2ART, TCO2, HCO3 in the last 72 hours.  Invalid input(s): PCO2 CULTURES Recent Results (from the past 240 hour(s))  Culture, blood (routine x 2)     Status: None   Collection Time: 04/07/14  7:38 PM  Result Value Ref Range Status   Specimen Description BLOOD RIGHT ANTECUBITAL  Final   Special Requests BOTTLES DRAWN AEROBIC AND ANAEROBIC 6CC  Final   Culture NO GROWTH 5 DAYS  Final   Report Status 04/12/2014 FINAL  Final  Culture, blood (routine x 2)     Status: None   Collection Time: 04/07/14  7:45 PM  Result Value Ref Range Status   Specimen Description BLOOD LEFT ARM  Final   Special Requests BOTTLES DRAWN AEROBIC AND ANAEROBIC 6CC  Final   Culture  Setup Time   Final    04/08/2014 23:56 Performed at [facility]    Culture   Final    STAPHYLOCOCCUS AUREUS Note: RIFAMPIN AND GENTAMICIN SHOULD NOT BE USED AS SINGLE DRUGS FOR TREATMENT OF STAPH INFECTIONS. STAPHYLOCOCCUS SPECIES (COAGULASE NEGATIVE) Note: THE SIGNIFICANCE OF ISOLATING THIS ORGANISM FROM A SINGLE VENIPUNCTURE CANNOT BE PREDICTED WITHOUT FURTHER CLINICAL AND CULTURE CORRELATION. SUSCEPTIBILITIES AVAILABLE ONLY ON REQUEST. Note: Gram Stain Report Called to,Read Back By and Verified With: HORINE C AT 1203 ON 04/08/2014 BY BAUGHAM M. Performed at [facility]    Report Status 04/11/2014 FINAL  Final   Organism ID, Bacteria STAPHYLOCOCCUS AUREUS  Final  Susceptibility   Staphylococcus aureus - MIC*     CLINDAMYCIN <=0.25 SENSITIVE Sensitive     ERYTHROMYCIN <=0.25 SENSITIVE Sensitive     GENTAMICIN <=0.5 SENSITIVE Sensitive     LEVOFLOXACIN <=0.12 SENSITIVE Sensitive     OXACILLIN 0.5 SENSITIVE Sensitive     PENICILLIN 0.06 SENSITIVE Sensitive     RIFAMPIN <=0.5 SENSITIVE Sensitive     TRIMETH/SULFA <=10 SENSITIVE Sensitive     VANCOMYCIN 1 SENSITIVE Sensitive     TETRACYCLINE <=1 SENSITIVE Sensitive     MOXIFLOXACIN <=0.25 SENSITIVE Sensitive     * STAPHYLOCOCCUS AUREUS  Culture, respiratory (NON-Expectorated)     Status: None   Collection Time: 04/10/14 11:00 AM  Result Value Ref Range Status   Specimen Description SPUTUM EXPECTORATED  Final   Special Requests NONE  Final   Gram Stain   Final    FEW WBC PRESENT,BOTH PMN AND MONONUCLEAR RARE SQUAMOUS EPITHELIAL CELLS PRESENT RARE GRAM POSITIVE COCCI IN PAIRS Performed at [facility]    Culture   Final    NORMAL OROPHARYNGEAL FLORA Performed at [facility]    Report Status 04/13/2014 FINAL  Final  Culture, sputum-assessment     Status: None   Collection Time: 04/10/14 11:20 AM  Result Value Ref Range Status   Specimen Description SPUTUM EXPECTORATED  Final   Special Requests NONE  Final   Sputum evaluation   Final    THIS SPECIMEN IS ACCEPTABLE. RESPIRATORY CULTURE REPORT TO FOLLOW. Performed at [facility]    Report Status 04/10/2014 FINAL  Final  Culture, blood (routine x 2)     Status: None (Preliminary result)   Collection Time: 04/11/14 11:14 AM  Result Value Ref Range Status   Specimen Description RIGHT ANTECUBITAL  Final   Special Requests BOTTLES DRAWN AEROBIC AND ANAEROBIC 8CC EACH  Final   Culture NO GROWTH 3 DAYS  Final   Report Status PENDING  Incomplete  Culture, blood (routine x 2)     Status: None (Preliminary result)   Collection Time: 04/11/14 11:20 AM  Result Value Ref Range Status   Specimen Description BLOOD RIGHT HAND  Final   Special Requests BOTTLES DRAWN AEROBIC AND  ANAEROBIC 6CC EACH  Final   Culture NO GROWTH 3 DAYS  Final   Report Status PENDING  Incomplete   Studies/Results: No results found.  Medications:  Prior to Admission:  Prescriptions prior to admission  Medication Sig Dispense Refill Last Dose  . albuterol (PROVENTIL HFA;VENTOLIN HFA) 108 (90 BASE) MCG/ACT inhaler Inhale 2 puffs into the lungs every 6 (six) hours as needed for wheezing or shortness of breath.   04/07/2014 at Unknown time  . albuterol (PROVENTIL) (2.5 MG/3ML) 0.083% nebulizer solution Take 2.5 mg by nebulization 4 (four) times daily.    04/07/2014 at Unknown time  . Black Cohosh 540 MG CAPS Take 1 capsule by mouth daily.   04/07/2014 at Unknown time  . busPIRone (BUSPAR) 10 MG tablet Take 10 mg by mouth 3 (three) times daily.   04/06/2014 at Unknown time  . cetirizine (ZYRTEC) 10 MG tablet Take 10 mg by mouth daily as needed for allergies.   Past Week at Unknown time  . cyclobenzaprine (FLEXERIL) 10 MG tablet Take 10 mg by mouth 2 (two) times daily.   04/07/2014 at Unknown time  . furosemide (LASIX) 20 MG tablet Take 1 tablet (20 mg total) by mouth daily. 30 tablet 6 04/06/2014 at Unknown time  . gabapentin (NEURONTIN) 300 MG capsule  Take 300 mg by mouth 3 (three) times daily.    04/06/2014 at Unknown time  . HYDROcodone-acetaminophen (NORCO/VICODIN) 5-325 MG per tablet Take 1 tablet by mouth 2 (two) times daily.   Past Week at Unknown time  . Lactobacillus (ACIDOPHILUS PROBIOTIC) 100 MG CAPS Take 1 capsule by mouth daily.   04/06/2014 at Unknown time  . levothyroxine (SYNTHROID, LEVOTHROID) 25 MCG tablet Take 25 mcg by mouth daily.   04/07/2014 at Unknown time  . metFORMIN (GLUCOPHAGE) 500 MG tablet Take 500 mg by mouth 2 (two) times daily with a meal.    04/06/2014 at Unknown time  . pantoprazole (PROTONIX) 40 MG tablet Take 40 mg by mouth daily.   04/06/2014 at Unknown time  . PARoxetine (PAXIL) 30 MG tablet Take 1 tablet (30 mg total) by mouth every morning. 30 tablet 1  04/06/2014 at Unknown time  . polyethylene glycol powder (GLYCOLAX/MIRALAX) powder Take 17 g by mouth daily. (Patient taking differently: Take 17 g by mouth daily as needed for mild constipation or moderate constipation. ) 255 g 1 unknown  . rosuvastatin (CRESTOR) 10 MG tablet Take 10 mg by mouth daily at 6 PM.   04/06/2014 at Unknown time  . tiotropium (SPIRIVA) 18 MCG inhalation capsule Place 1 capsule (18 mcg total) into inhaler and inhale daily. 30 capsule 1 Past Week at Unknown time  . traZODone (DESYREL) 50 MG tablet Take 25-50 mg by mouth at bedtime as needed for sleep.    Past Month at Unknown time  . Vitamin D, Ergocalciferol, (DRISDOL) 50000 UNITS CAPS capsule Take 50,000 Units by mouth every 7 (seven) days.   04/07/2014 at Unknown time  . acidophilus (RISAQUAD) CAPS Take 1 capsule by mouth daily. (Patient not taking: Reported on 04/07/2014) 30 capsule 5 08/30/2013 at Unknown time  . busPIRone (BUSPAR) 5 MG tablet Take 5 mg by mouth 3 (three) times daily.   08/29/2013 at Unknown time  . Fluticasone-Salmeterol (ADVAIR) 250-50 MCG/DOSE AEPB Inhale 1 puff into the lungs every 12 (twelve) hours. (Patient not taking: Reported on 04/07/2014) 60 each 1 08/30/2013 at Unknown time   Scheduled: . acidophilus  1 capsule Oral Daily  . busPIRone  10 mg Oral TID  .  ceFAZolin (ANCEF) IV  2 g Intravenous 3 times per day  . enoxaparin (LOVENOX) injection  40 mg Subcutaneous Q24H  . gabapentin  300 mg Oral TID  . guaiFENesin  1,200 mg Oral BID  . HYDROcodone-acetaminophen  1 tablet Oral BID  . insulin aspart  0-20 Units Subcutaneous TID WC  . insulin aspart  0-5 Units Subcutaneous QHS  . ipratropium-albuterol  3 mL Nebulization Q4H  . levothyroxine  25 mcg Oral QAC breakfast  . loratadine  10 mg Oral Daily  . methylPREDNISolone (SOLU-MEDROL) injection  40 mg Intravenous Q12H  . nicotine  14 mg Transdermal Daily  . pantoprazole  40 mg Oral Daily  . PARoxetine  30 mg Oral Daily  . pneumococcal 23  valent vaccine  0.5 mL Intramuscular Tomorrow-1000  . polyethylene glycol  17 g Oral Daily  . rosuvastatin  10 mg Oral q1800  . tiotropium  18 mcg Inhalation Daily  . Vitamin D (Ergocalciferol)  50,000 Units Oral Q7 days   Continuous: . sodium chloride 10 mL/hr at 04/11/14 0850   ALP:[id number], ipratropium-albuterol, LORazepam, traZODone  Assesment: She was admitted with community-acquired pneumonia and has positive blood culture for Staphylococcus. Her repeat blood cultures are negative. She will need 4-6 weeks of IV antibiotics and  will need follow-up at the regional [facility] infectious [facility] Principal Problem:   CAP (community acquired pneumonia) Active Problems:   Ischemic cardiomyopathy   HTN (hypertension)   Tobacco abuse   Anxiety disorder   CAD (coronary artery disease)   Systolic CHF, chronic   Diabetes   Acute respiratory failure with hypoxia   COPD exacerbation    Plan: Place PICC line today. If we can make arrangements for her to have IV antibiotics as an outpatient she can be discharged    LOS: 8 days   Kendel Bessey L 04/15/2014, 8:55 AM

## 2014-04-15 NOTE — Progress Notes (Signed)
PICC line unsuccessful in right arm.   Xray showed curling of PICC in arm. Removed by Meagan RN and dressing applied.  PICC team will reattempt access in the morning.  If unable to obtain access in AM, patient may need IR at [facility] for placement of line.  Patient aware and tolerated procedure.  Patient educated and understands.  Discharge cancelled pending placement of line. Case Manager aware and notifying [place].

## 2014-04-15 NOTE — Discharge Summary (Signed)
Physician Discharge Summary  Patient ID: Kristin Wells MRN: [id number] DOB/AGE: [birth year] 63 y.o. Primary Care Physician:Lutie Pickler L, MD Admit date: 04/07/2014 Discharge date: 04/15/2014    Discharge Diagnoses:   Principal Problem:   CAP (community acquired pneumonia) Active Problems:   Ischemic cardiomyopathy   HTN (hypertension)   Tobacco abuse   Anxiety disorder   CAD (coronary artery disease)   Systolic CHF, chronic   Diabetes   Acute respiratory failure with hypoxia   COPD exacerbation   Staphylococcus aureus bacteremia     Medication List    ASK your doctor about these medications        acidophilus Caps capsule  Take 1 capsule by mouth daily.     ACIDOPHILUS PROBIOTIC 100 MG Caps  Take 1 capsule by mouth daily.     albuterol (2.5 MG/3ML) 0.083% nebulizer solution  Commonly known as:  PROVENTIL  Take 2.5 mg by nebulization 4 (four) times daily.     albuterol 108 (90 BASE) MCG/ACT inhaler  Commonly known as:  PROVENTIL HFA;VENTOLIN HFA  Inhale 2 puffs into the lungs every 6 (six) hours as needed for wheezing or shortness of breath.     Black Cohosh 540 MG Caps  Take 1 capsule by mouth daily.     busPIRone 10 MG tablet  Commonly known as:  BUSPAR  Take 10 mg by mouth 3 (three) times daily.     busPIRone 5 MG tablet  Commonly known as:  BUSPAR  Take 5 mg by mouth 3 (three) times daily.     cetirizine 10 MG tablet  Commonly known as:  ZYRTEC  Take 10 mg by mouth daily as needed for allergies.     cyclobenzaprine 10 MG tablet  Commonly known as:  FLEXERIL  Take 10 mg by mouth 2 (two) times daily.     Fluticasone-Salmeterol 250-50 MCG/DOSE Aepb  Commonly known as:  ADVAIR  Inhale 1 puff into the lungs every 12 (twelve) hours.     furosemide 20 MG tablet  Commonly known as:  LASIX  Take 1 tablet (20 mg total) by mouth daily.     gabapentin 300 MG capsule  Commonly known as:  NEURONTIN  Take 300 mg by mouth 3 (three) times daily.      HYDROcodone-acetaminophen 5-325 MG per tablet  Commonly known as:  NORCO/VICODIN  Take 1 tablet by mouth 2 (two) times daily.     levothyroxine 25 MCG tablet  Commonly known as:  SYNTHROID, LEVOTHROID  Take 25 mcg by mouth daily.     metFORMIN 500 MG tablet  Commonly known as:  GLUCOPHAGE  Take 500 mg by mouth 2 (two) times daily with a meal.     pantoprazole 40 MG tablet  Commonly known as:  PROTONIX  Take 40 mg by mouth daily.     PARoxetine 30 MG tablet  Commonly known as:  PAXIL  Take 1 tablet (30 mg total) by mouth every morning.     polyethylene glycol powder powder  Commonly known as:  GLYCOLAX/MIRALAX  Take 17 g by mouth daily.     rosuvastatin 10 MG tablet  Commonly known as:  CRESTOR  Take 10 mg by mouth daily at 6 PM.     tiotropium 18 MCG inhalation capsule  Commonly known as:  SPIRIVA  Place 1 capsule (18 mcg total) into inhaler and inhale daily.     traZODone 50 MG tablet  Commonly known as:  DESYREL  Take 25-50 mg by mouth at  bedtime as needed for sleep.     Vitamin D (Ergocalciferol) 50000 UNITS Caps capsule  Commonly known as:  DRISDOL  Take 50,000 Units by mouth every 7 (seven) days.        Discharged Condition: Improved    Consults: Telephone with infectious disease  Significant Diagnostic Studies: Dg Chest 2 View  04/07/2014   CLINICAL DATA:  Shortness of breath.  EXAM: CHEST  2 VIEW  COMPARISON:  01/29/2013.  FINDINGS: Mediastinum and hilar structures are normal. Lingular infiltrate is present. Possible right middle lobe infiltrate. No pleural effusion or pneumothorax. Degenerative changes thoracic spine.  IMPRESSION: Mild lingular and possibly right middle lobe pulmonary infiltrate consistent with pneumonia.   Electronically Signed   By: Marcello Moores  Register   On: 04/07/2014 17:10    Lab Results: Basic Metabolic Panel:  Recent Labs  04/13/14 0550 04/14/14 0542  NA 142  --   K 5.5*  --   CL 98  --   CO2 35*  --   GLUCOSE 164*  --    BUN 27*  --   CREATININE 0.89 0.87  CALCIUM 8.7  --    Liver Function Tests:  Recent Labs  04/13/14 0550  AST 16  ALT 15  ALKPHOS 56  BILITOT 0.2*  PROT 5.9*  ALBUMIN 3.0*     CBC:  Recent Labs  04/14/14 0542 04/15/14 0529  WBC 8.3 8.6  NEUTROABS 6.6 7.4  HGB 14.2 13.5  HCT 43.3 42.3  MCV 87.7 87.4  PLT 220 213    Recent Results (from the past 240 hour(s))  Culture, blood (routine x 2)     Status: None   Collection Time: 04/07/14  7:38 PM  Result Value Ref Range Status   Specimen Description BLOOD RIGHT ANTECUBITAL  Final   Special Requests BOTTLES DRAWN AEROBIC AND ANAEROBIC 6CC  Final   Culture NO GROWTH 5 DAYS  Final   Report Status 04/12/2014 FINAL  Final  Culture, blood (routine x 2)     Status: None   Collection Time: 04/07/14  7:45 PM  Result Value Ref Range Status   Specimen Description BLOOD LEFT ARM  Final   Special Requests BOTTLES DRAWN AEROBIC AND ANAEROBIC 6CC  Final   Culture  Setup Time   Final    04/08/2014 23:56 Performed at [facility]    Culture   Final    STAPHYLOCOCCUS AUREUS Note: RIFAMPIN AND GENTAMICIN SHOULD NOT BE USED AS SINGLE DRUGS FOR TREATMENT OF STAPH INFECTIONS. STAPHYLOCOCCUS SPECIES (COAGULASE NEGATIVE) Note: THE SIGNIFICANCE OF ISOLATING THIS ORGANISM FROM A SINGLE VENIPUNCTURE CANNOT BE PREDICTED WITHOUT FURTHER CLINICAL AND CULTURE CORRELATION. SUSCEPTIBILITIES AVAILABLE ONLY ON REQUEST. Note: Gram Stain Report Called to,Read Back By and Verified With: HORINE C AT 1203 ON 04/08/2014 BY BAUGHAM M. Performed at [facility]    Report Status 04/11/2014 FINAL  Final   Organism ID, Bacteria STAPHYLOCOCCUS AUREUS  Final      Susceptibility   Staphylococcus aureus - MIC*    CLINDAMYCIN <=0.25 SENSITIVE Sensitive     ERYTHROMYCIN <=0.25 SENSITIVE Sensitive     GENTAMICIN <=0.5 SENSITIVE Sensitive     LEVOFLOXACIN <=0.12 SENSITIVE Sensitive     OXACILLIN 0.5 SENSITIVE Sensitive     PENICILLIN 0.06  SENSITIVE Sensitive     RIFAMPIN <=0.5 SENSITIVE Sensitive     TRIMETH/SULFA <=10 SENSITIVE Sensitive     VANCOMYCIN 1 SENSITIVE Sensitive     TETRACYCLINE <=1 SENSITIVE Sensitive     MOXIFLOXACIN <=0.25  SENSITIVE Sensitive     * STAPHYLOCOCCUS AUREUS  Culture, respiratory (NON-Expectorated)     Status: None   Collection Time: 04/10/14 11:00 AM  Result Value Ref Range Status   Specimen Description SPUTUM EXPECTORATED  Final   Special Requests NONE  Final   Gram Stain   Final    FEW WBC PRESENT,BOTH PMN AND MONONUCLEAR RARE SQUAMOUS EPITHELIAL CELLS PRESENT RARE GRAM POSITIVE COCCI IN PAIRS Performed at [facility]    Culture   Final    NORMAL OROPHARYNGEAL FLORA Performed at [facility]    Report Status 04/13/2014 FINAL  Final  Culture, sputum-assessment     Status: None   Collection Time: 04/10/14 11:20 AM  Result Value Ref Range Status   Specimen Description SPUTUM EXPECTORATED  Final   Special Requests NONE  Final   Sputum evaluation   Final    THIS SPECIMEN IS ACCEPTABLE. RESPIRATORY CULTURE REPORT TO FOLLOW. Performed at [facility]    Report Status 04/10/2014 FINAL  Final  Culture, blood (routine x 2)     Status: None (Preliminary result)   Collection Time: 04/11/14 11:14 AM  Result Value Ref Range Status   Specimen Description RIGHT ANTECUBITAL  Final   Special Requests BOTTLES DRAWN AEROBIC AND ANAEROBIC 8CC EACH  Final   Culture NO GROWTH 3 DAYS  Final   Report Status PENDING  Incomplete  Culture, blood (routine x 2)     Status: None (Preliminary result)   Collection Time: 04/11/14 11:20 AM  Result Value Ref Range Status   Specimen Description BLOOD RIGHT HAND  Final   Special Requests BOTTLES DRAWN AEROBIC AND ANAEROBIC [place]  Final   Culture NO GROWTH 3 DAYS  Final   Report Status PENDING  Incomplete     Hospital Course: This is a 63 year old with a long history of COPD. She was in her usual state of fair health at home when  she developed increasing cough and congestion. She came to the emergency department where she was noted to have pneumonia. She was started on IV antibiotics. Blood culture grew staph aureus in one culture. She was initially started on other antibiotics but with the positive culture for Staphylococcus she was started on Ancef. She was markedly improved at the time of discharge. She had repeat blood cultures done which are negative at 72 hours. Telephone consultation with infectious disease was done and it is felt that she is going to need 4-6 weeks of IV antibiotics. She is to have PICC line placed today and then can be discharged after that  Discharge Exam: Blood pressure 105/67, pulse 69, temperature 98.7 F (37.1 C), temperature source Oral, resp. rate 20, height 5\' 1"  (1.549 m), weight 63.413 kg (139 lb 12.8 oz), SpO2 97 %. She is awake and alert. Her chest is clear. Her heart is regular  Disposition: Home with home health services for 4 weeks of IV antibiotics        Follow-up Information    Follow up with [place].   Contact information:   [place] [address] [phone]       Follow up with [facility].   Contact information:   [address] [place]  [phone]       Signed: Alonza Bogus   04/15/2014, 9:02 AM

## 2014-04-15 NOTE — Plan of Care (Signed)
Problem: Phase II Progression Outcomes Goal: Dyspnea controlled w/progressive activity Outcome: Completed/Met Date Met:  04/15/14

## 2014-04-15 NOTE — Progress Notes (Signed)
PICC line attempted in right arm. Xray done to confirm placement and showed that the entire PICC was curled in the arm. PICC line was taken out, Vaseline gauze and pressure dressing applied. Will attempt another bedside placement of PICC tomorrow morning. Pt's nurse, Mearl Latin, RN notified.

## 2014-04-15 NOTE — Plan of Care (Signed)
Problem: Phase II Progression Outcomes Goal: Tolerating diet Outcome: Completed/Met Date Met:  04/15/14     

## 2014-04-16 ENCOUNTER — Encounter (HOSPITAL_COMMUNITY): Payer: PRIVATE HEALTH INSURANCE

## 2014-04-16 ENCOUNTER — Inpatient Hospital Stay (HOSPITAL_COMMUNITY)
Admit: 2014-04-16 | Discharge: 2014-04-16 | Disposition: A | Discharge status: Home / Self Care | Payer: PRIVATE HEALTH INSURANCE

## 2014-04-16 LAB — CBC WITH DIFFERENTIAL/PLATELET
Basophils Absolute: 0 10*3/uL (ref 0.0–0.1)
Basophils Relative: 0 % (ref 0–1)
Eosinophils Absolute: 0 10*3/uL (ref 0.0–0.7)
Eosinophils Relative: 0 % (ref 0–5)
HCT: 40.8 % (ref 36.0–46.0)
Hemoglobin: 13.5 g/dL (ref 12.0–15.0)
Lymphocytes Relative: 13 % (ref 12–46)
Lymphs Abs: 1.1 10*3/uL (ref 0.7–4.0)
MCH: 29 pg (ref 26.0–34.0)
MCHC: 33.1 g/dL (ref 30.0–36.0)
MCV: 87.6 fL (ref 78.0–100.0)
Monocytes Absolute: 0.5 10*3/uL (ref 0.1–1.0)
Monocytes Relative: 6 % (ref 3–12)
Neutro Abs: 6.9 10*3/uL (ref 1.7–7.7)
Neutrophils Relative %: 81 % — ABNORMAL HIGH (ref 43–77)
Platelets: 202 10*3/uL (ref 150–400)
RBC: 4.66 MIL/uL (ref 3.87–5.11)
RDW: 14.7 % (ref 11.5–15.5)
WBC: 8.5 10*3/uL (ref 4.0–10.5)

## 2014-04-16 LAB — CULTURE, BLOOD (ROUTINE X 2)
Culture: NO GROWTH
Culture: NO GROWTH

## 2014-04-16 LAB — GLUCOSE, CAPILLARY
Glucose-Capillary: 145 mg/dL — ABNORMAL HIGH (ref 70–99)
Glucose-Capillary: 133 mg/dL — ABNORMAL HIGH (ref 70–99)

## 2014-04-16 MED ORDER — SODIUM CHLORIDE 0.9 % IJ SOLN: 10.0000 mL | INTRAMUSCULAR | Status: DC | PRN

## 2014-04-16 MED ORDER — SODIUM CHLORIDE 0.9 % IJ SOLN
10.0000 mL | Freq: Two times a day (BID) | INTRAMUSCULAR | Status: DC
Start: 2014-04-16 — End: 2014-04-23

## 2014-04-16 NOTE — Care Management Utilization Note (Signed)
UR complete 

## 2014-04-16 NOTE — Progress Notes (Signed)
Patient refused pneumonia vaccine patient states she has had the vaccine.

## 2014-04-16 NOTE — Progress Notes (Signed)
Discharge instruction reviewed with patient. PICC line intact to left arm. Dressing dry and intact over PICC line site. Patient's medication returned to patient. [facility] to follow patient at home. No distress noted. Taken to lobby via wheelchair.

## 2014-04-16 NOTE — Progress Notes (Signed)
Pt ambulated in hall about 200 ft.  Pt tolerated well and used cane with other hand on the hand rail.

## 2014-04-16 NOTE — Progress Notes (Signed)
Her PICC line coiled up and could not be positioned in the right place. Her discharge was held and she is going to have another attempt at [facility] line placement this morning. If the PICC line can be successfully placed she can be discharged home to finish her antibiotics at home.

## 2014-04-16 NOTE — Clinical Social Work Psychosocial (Signed)
Clinical Social Work Department BRIEF PSYCHOSOCIAL ASSESSMENT 04/16/2014  Patient:  FELISIA, BALCOM     Account Number:  [id number]     Admit date:  04/07/2014  Clinical Social Worker:  Wyatt Haste  Date/Time:  04/16/2014 11:16 AM  Referred by:  Care Management  Date Referred:  04/16/2014 Referred for  SNF Placement   Other Referral:   Interview type:  Patient Other interview type:    PSYCHOSOCIAL DATA Living Status:  ALONE Admitted from facility:   Level of care:   Primary support name:   Primary support relationship to patient:   Degree of support available:   limited local support    CURRENT CONCERNS Current Concerns  Post-Acute Placement   Other Concerns:    SOCIAL WORK ASSESSMENT / PLAN CSW met with pt at bedside following referral for CM for possible SNF. Pt alert and oriented and states she lives alone. Pt has a CNA daily for 3 hours each day. Her children live out of town. Pt will require 4-6 weeks IV antibiotics and got a PICC line today. She has been planning to go home with home health for teaching. However, this morning, she had some reservations about her ability to do IV antibiotics. CSW discussed possible placement with pt. She initially said she would consider SNF, but then started talking about how she is independent at home and would be "just sitting in a facility." Pt was very concerned about finances. Per [facility] which was pt's only consideration, pt would be assigned liability from DSS. Pt states that she requires every dollar of her check and cannot risk losing her apartment to go to SNF. She is aware that she could consider a payment plan, but is not interested in this either. Pt became tearful at end of conversation and states, "I just want it to work out at home." She feels that with teaching from home health, she should be able to manage.   Assessment/plan status:  Referral to [employer] Other assessment/ plan:   Information/referral to  community resources:   CM for home health    PATIENT'S/FAMILY'S RESPONSE TO PLAN OF CARE: Pt plans to return home at d/c. [place] notified CM and MD and will sign off.       Benay Pike, [place]

## 2014-04-16 NOTE — Plan of Care (Signed)
Problem: ICU Phase Progression Outcomes Goal: Initial discharge plan identified Outcome: Completed/Met Date Met:  04/16/14 PICC LINE intact. IV antibiotics to be given at home

## 2014-04-30 ENCOUNTER — Encounter: Payer: Self-pay | Admitting: Infectious Disease

## 2014-04-30 ENCOUNTER — Ambulatory Visit
Admission: RE | Admit: 2014-04-30 | Discharge: 2014-04-30 | Disposition: A | Discharge status: Home / Self Care | Payer: PRIVATE HEALTH INSURANCE | Source: Ambulatory Visit | Attending: Infectious Disease | Admitting: Infectious Disease

## 2014-04-30 ENCOUNTER — Other Ambulatory Visit: Payer: Self-pay | Admitting: Infectious Disease

## 2014-04-30 ENCOUNTER — Ambulatory Visit (INDEPENDENT_AMBULATORY_CARE_PROVIDER_SITE_OTHER): Payer: PRIVATE HEALTH INSURANCE | Admitting: Infectious Disease

## 2014-04-30 VITALS — BP 116/77 | HR 71 | Temp 98.7°F

## 2014-04-30 DIAGNOSIS — L97519 Non-pressure chronic ulcer of other part of right foot with unspecified severity: Secondary | ICD-10-CM

## 2014-04-30 DIAGNOSIS — L97529 Non-pressure chronic ulcer of other part of left foot with unspecified severity: Principal | ICD-10-CM

## 2014-04-30 DIAGNOSIS — A4101 Sepsis due to Methicillin susceptible Staphylococcus aureus: Secondary | ICD-10-CM

## 2014-04-30 DIAGNOSIS — R7881 Bacteremia: Secondary | ICD-10-CM

## 2014-04-30 DIAGNOSIS — E11621 Type 2 diabetes mellitus with foot ulcer: Secondary | ICD-10-CM | POA: Insufficient documentation

## 2014-04-30 DIAGNOSIS — B351 Tinea unguium: Secondary | ICD-10-CM

## 2014-04-30 DIAGNOSIS — L97509 Non-pressure chronic ulcer of other part of unspecified foot with unspecified severity: Secondary | ICD-10-CM

## 2014-04-30 DIAGNOSIS — B9561 Methicillin susceptible Staphylococcus aureus infection as the cause of diseases classified elsewhere: Secondary | ICD-10-CM

## 2014-04-30 NOTE — Progress Notes (Signed)
   Subjective:    Patient ID: Kristin Wells, female    DOB: May 26, 1950, 63 y.o.   MRN: [id number]  HPI  63 year old patient with Diabetes mellitus who was admitted to [facility] with Staphylococcus Aureus Bacteremia. At the time she was thought to be suffering from CAP and now other source had been identified.   She did not have TEE to rule out endocarditis so we have decided to go with 4 weeks of IV antibiotics from date of first negative blood culture.  Today she tells me that her first two toes and in particular her big toe have been hurting chronically and that there has been pus emanating from the big toenail from time to time.   Review of Systems  Constitutional: Negative for fever, chills, diaphoresis, activity change, appetite change, fatigue and unexpected weight change.  HENT: Negative for congestion, rhinorrhea, sinus pressure, sneezing, sore throat and trouble swallowing.   Eyes: Negative for photophobia and visual disturbance.  Respiratory: Negative for cough, chest tightness, shortness of breath, wheezing and stridor.   Cardiovascular: Negative for chest pain, palpitations and leg swelling.  Gastrointestinal: Negative for nausea, vomiting, abdominal pain, diarrhea, constipation, blood in stool, abdominal distention and anal bleeding.  Genitourinary: Negative for dysuria, hematuria, flank pain and difficulty urinating.  Musculoskeletal: Negative for myalgias, back pain, joint swelling, arthralgias and gait problem.  Skin: Negative for color change, pallor, rash and wound.  Neurological: Negative for dizziness, tremors, weakness and light-headedness.  Hematological: Negative for adenopathy. Does not bruise/bleed easily.  Psychiatric/Behavioral: Negative for behavioral problems, confusion, sleep disturbance, dysphoric mood, decreased concentration and agitation.       Objective:   Physical Exam  Constitutional: She is oriented to person, place, and time. She appears  well-developed and well-nourished. No distress.  HENT:  Head: Normocephalic and atraumatic.  Mouth/Throat: No oropharyngeal exudate.  Eyes: Conjunctivae and EOM are normal. No scleral icterus.  Neck: Normal range of motion. Neck supple.  Cardiovascular: Normal rate and regular rhythm.   Pulmonary/Chest: Effort normal. No respiratory distress. She has no wheezes.  Abdominal: She exhibits no distension.  Neurological: She is alert and oriented to person, place, and time. She exhibits normal muscle tone. Coordination normal.  Skin: Skin is warm and dry. No rash noted. She is not diaphoretic. There is erythema. No pallor.  Psychiatric: She has a normal mood and affect. Her behavior is normal. Judgment and thought content normal.   PICC is clean:     Left foot 04/30/14: dorsal and ventral view. Onychomycosis big toenail and area medially where she states there is pus emanating from time to time       Right foot 04/30/14:            Assessment & Plan:    Staphylococcus Aureus Bacteremia: I am beginning to wonder if her toe could be a source  --continue IV ancef through 05/08/14, DC PICC line then surveillance cultures 2 weeks--later, unless we have identified a need for more protracted course  -check plain films of left foot and then if normal MRI   .I spent greater than 60 minutes with the patient including greater than 50% of time in face to face counsel of the patient and in coordination of their care.   #2 Diabetic foot infection: --plain films of foot and if normal then MRI with contrast

## 2014-04-30 NOTE — Patient Instructions (Signed)
We need ancef to go thru December 31st, then PICC can be pulled on the 1st

## 2014-05-06 ENCOUNTER — Telehealth: Payer: Self-pay | Admitting: *Deleted

## 2014-05-06 NOTE — Telephone Encounter (Signed)
Verbal order per Dr. Tommy Medal given to [place] at [facility] to pull patient's picc line on 05/09/14. Myrtis Hopping

## 2014-05-09 DIAGNOSIS — G589 Mononeuropathy, unspecified: Secondary | ICD-10-CM | POA: Diagnosis not present

## 2014-05-09 DIAGNOSIS — E119 Type 2 diabetes mellitus without complications: Secondary | ICD-10-CM | POA: Diagnosis not present

## 2014-05-09 DIAGNOSIS — M179 Osteoarthritis of knee, unspecified: Secondary | ICD-10-CM | POA: Diagnosis not present

## 2014-05-10 DIAGNOSIS — E119 Type 2 diabetes mellitus without complications: Secondary | ICD-10-CM | POA: Diagnosis not present

## 2014-05-10 DIAGNOSIS — M179 Osteoarthritis of knee, unspecified: Secondary | ICD-10-CM | POA: Diagnosis not present

## 2014-05-10 DIAGNOSIS — G589 Mononeuropathy, unspecified: Secondary | ICD-10-CM | POA: Diagnosis not present

## 2014-05-11 DIAGNOSIS — G589 Mononeuropathy, unspecified: Secondary | ICD-10-CM | POA: Diagnosis not present

## 2014-05-11 DIAGNOSIS — M179 Osteoarthritis of knee, unspecified: Secondary | ICD-10-CM | POA: Diagnosis not present

## 2014-05-11 DIAGNOSIS — E119 Type 2 diabetes mellitus without complications: Secondary | ICD-10-CM | POA: Diagnosis not present

## 2014-05-12 ENCOUNTER — Encounter: Payer: Self-pay | Admitting: Infectious Disease

## 2014-05-12 DIAGNOSIS — E119 Type 2 diabetes mellitus without complications: Secondary | ICD-10-CM | POA: Diagnosis not present

## 2014-05-12 DIAGNOSIS — G589 Mononeuropathy, unspecified: Secondary | ICD-10-CM | POA: Diagnosis not present

## 2014-05-12 DIAGNOSIS — M179 Osteoarthritis of knee, unspecified: Secondary | ICD-10-CM | POA: Diagnosis not present

## 2014-05-13 DIAGNOSIS — M179 Osteoarthritis of knee, unspecified: Secondary | ICD-10-CM | POA: Diagnosis not present

## 2014-05-13 DIAGNOSIS — G589 Mononeuropathy, unspecified: Secondary | ICD-10-CM | POA: Diagnosis not present

## 2014-05-13 DIAGNOSIS — E119 Type 2 diabetes mellitus without complications: Secondary | ICD-10-CM | POA: Diagnosis not present

## 2014-05-14 DIAGNOSIS — E119 Type 2 diabetes mellitus without complications: Secondary | ICD-10-CM | POA: Diagnosis not present

## 2014-05-14 DIAGNOSIS — M179 Osteoarthritis of knee, unspecified: Secondary | ICD-10-CM | POA: Diagnosis not present

## 2014-05-14 DIAGNOSIS — G589 Mononeuropathy, unspecified: Secondary | ICD-10-CM | POA: Diagnosis not present

## 2014-05-15 DIAGNOSIS — E119 Type 2 diabetes mellitus without complications: Secondary | ICD-10-CM | POA: Diagnosis not present

## 2014-05-15 DIAGNOSIS — M179 Osteoarthritis of knee, unspecified: Secondary | ICD-10-CM | POA: Diagnosis not present

## 2014-05-15 DIAGNOSIS — G589 Mononeuropathy, unspecified: Secondary | ICD-10-CM | POA: Diagnosis not present

## 2014-05-16 DIAGNOSIS — G589 Mononeuropathy, unspecified: Secondary | ICD-10-CM | POA: Diagnosis not present

## 2014-05-16 DIAGNOSIS — M179 Osteoarthritis of knee, unspecified: Secondary | ICD-10-CM | POA: Diagnosis not present

## 2014-05-16 DIAGNOSIS — E119 Type 2 diabetes mellitus without complications: Secondary | ICD-10-CM | POA: Diagnosis not present

## 2014-05-17 DIAGNOSIS — E119 Type 2 diabetes mellitus without complications: Secondary | ICD-10-CM | POA: Diagnosis not present

## 2014-05-17 DIAGNOSIS — G589 Mononeuropathy, unspecified: Secondary | ICD-10-CM | POA: Diagnosis not present

## 2014-05-17 DIAGNOSIS — M179 Osteoarthritis of knee, unspecified: Secondary | ICD-10-CM | POA: Diagnosis not present

## 2014-05-18 DIAGNOSIS — E119 Type 2 diabetes mellitus without complications: Secondary | ICD-10-CM | POA: Diagnosis not present

## 2014-05-18 DIAGNOSIS — M179 Osteoarthritis of knee, unspecified: Secondary | ICD-10-CM | POA: Diagnosis not present

## 2014-05-18 DIAGNOSIS — G589 Mononeuropathy, unspecified: Secondary | ICD-10-CM | POA: Diagnosis not present

## 2014-05-19 DIAGNOSIS — G589 Mononeuropathy, unspecified: Secondary | ICD-10-CM | POA: Diagnosis not present

## 2014-05-19 DIAGNOSIS — M179 Osteoarthritis of knee, unspecified: Secondary | ICD-10-CM | POA: Diagnosis not present

## 2014-05-19 DIAGNOSIS — E119 Type 2 diabetes mellitus without complications: Secondary | ICD-10-CM | POA: Diagnosis not present

## 2014-05-20 ENCOUNTER — Other Ambulatory Visit: Payer: Self-pay | Admitting: Licensed Clinical Social Worker

## 2014-05-20 DIAGNOSIS — M179 Osteoarthritis of knee, unspecified: Secondary | ICD-10-CM | POA: Diagnosis not present

## 2014-05-20 DIAGNOSIS — E119 Type 2 diabetes mellitus without complications: Secondary | ICD-10-CM | POA: Diagnosis not present

## 2014-05-20 DIAGNOSIS — L97509 Non-pressure chronic ulcer of other part of unspecified foot with unspecified severity: Principal | ICD-10-CM

## 2014-05-20 DIAGNOSIS — G589 Mononeuropathy, unspecified: Secondary | ICD-10-CM | POA: Diagnosis not present

## 2014-05-20 DIAGNOSIS — E08621 Diabetes mellitus due to underlying condition with foot ulcer: Secondary | ICD-10-CM

## 2014-05-20 NOTE — Progress Notes (Signed)
Yes can you please the order in for me?

## 2014-05-21 DIAGNOSIS — E119 Type 2 diabetes mellitus without complications: Secondary | ICD-10-CM | POA: Diagnosis not present

## 2014-05-21 DIAGNOSIS — M179 Osteoarthritis of knee, unspecified: Secondary | ICD-10-CM | POA: Diagnosis not present

## 2014-05-21 DIAGNOSIS — G589 Mononeuropathy, unspecified: Secondary | ICD-10-CM | POA: Diagnosis not present

## 2014-05-22 DIAGNOSIS — G589 Mononeuropathy, unspecified: Secondary | ICD-10-CM | POA: Diagnosis not present

## 2014-05-22 DIAGNOSIS — M179 Osteoarthritis of knee, unspecified: Secondary | ICD-10-CM | POA: Diagnosis not present

## 2014-05-22 DIAGNOSIS — E119 Type 2 diabetes mellitus without complications: Secondary | ICD-10-CM | POA: Diagnosis not present

## 2014-05-23 DIAGNOSIS — G589 Mononeuropathy, unspecified: Secondary | ICD-10-CM | POA: Diagnosis not present

## 2014-05-23 DIAGNOSIS — E119 Type 2 diabetes mellitus without complications: Secondary | ICD-10-CM | POA: Diagnosis not present

## 2014-05-23 DIAGNOSIS — M179 Osteoarthritis of knee, unspecified: Secondary | ICD-10-CM | POA: Diagnosis not present

## 2014-05-24 DIAGNOSIS — M179 Osteoarthritis of knee, unspecified: Secondary | ICD-10-CM | POA: Diagnosis not present

## 2014-05-24 DIAGNOSIS — G589 Mononeuropathy, unspecified: Secondary | ICD-10-CM | POA: Diagnosis not present

## 2014-05-24 DIAGNOSIS — E119 Type 2 diabetes mellitus without complications: Secondary | ICD-10-CM | POA: Diagnosis not present

## 2014-05-25 DIAGNOSIS — M179 Osteoarthritis of knee, unspecified: Secondary | ICD-10-CM | POA: Diagnosis not present

## 2014-05-25 DIAGNOSIS — E119 Type 2 diabetes mellitus without complications: Secondary | ICD-10-CM | POA: Diagnosis not present

## 2014-05-25 DIAGNOSIS — G589 Mononeuropathy, unspecified: Secondary | ICD-10-CM | POA: Diagnosis not present

## 2014-05-26 DIAGNOSIS — M179 Osteoarthritis of knee, unspecified: Secondary | ICD-10-CM | POA: Diagnosis not present

## 2014-05-26 DIAGNOSIS — E119 Type 2 diabetes mellitus without complications: Secondary | ICD-10-CM | POA: Diagnosis not present

## 2014-05-26 DIAGNOSIS — G589 Mononeuropathy, unspecified: Secondary | ICD-10-CM | POA: Diagnosis not present

## 2014-05-27 DIAGNOSIS — G589 Mononeuropathy, unspecified: Secondary | ICD-10-CM | POA: Diagnosis not present

## 2014-05-27 DIAGNOSIS — M179 Osteoarthritis of knee, unspecified: Secondary | ICD-10-CM | POA: Diagnosis not present

## 2014-05-27 DIAGNOSIS — E119 Type 2 diabetes mellitus without complications: Secondary | ICD-10-CM | POA: Diagnosis not present

## 2014-05-28 ENCOUNTER — Encounter: Payer: Self-pay | Admitting: Infectious Disease

## 2014-05-28 ENCOUNTER — Ambulatory Visit (INDEPENDENT_AMBULATORY_CARE_PROVIDER_SITE_OTHER): Payer: Medicare Other | Admitting: Infectious Disease

## 2014-05-28 VITALS — BP 138/100 | HR 115 | Temp 98.7°F | Wt 134.0 lb

## 2014-05-28 DIAGNOSIS — B351 Tinea unguium: Secondary | ICD-10-CM | POA: Diagnosis not present

## 2014-05-28 DIAGNOSIS — R7881 Bacteremia: Secondary | ICD-10-CM | POA: Diagnosis not present

## 2014-05-28 DIAGNOSIS — M179 Osteoarthritis of knee, unspecified: Secondary | ICD-10-CM | POA: Diagnosis not present

## 2014-05-28 DIAGNOSIS — B9561 Methicillin susceptible Staphylococcus aureus infection as the cause of diseases classified elsewhere: Secondary | ICD-10-CM | POA: Diagnosis not present

## 2014-05-28 DIAGNOSIS — E11621 Type 2 diabetes mellitus with foot ulcer: Secondary | ICD-10-CM | POA: Diagnosis not present

## 2014-05-28 DIAGNOSIS — L97529 Non-pressure chronic ulcer of other part of left foot with unspecified severity: Secondary | ICD-10-CM | POA: Diagnosis not present

## 2014-05-28 DIAGNOSIS — E119 Type 2 diabetes mellitus without complications: Secondary | ICD-10-CM | POA: Diagnosis not present

## 2014-05-28 DIAGNOSIS — G589 Mononeuropathy, unspecified: Secondary | ICD-10-CM | POA: Diagnosis not present

## 2014-05-28 MED ORDER — TERBINAFINE HCL 250 MG PO TABS: 250.0000 mg | ORAL_TABLET | Freq: Every day | ORAL | Status: DC

## 2014-05-28 NOTE — Progress Notes (Signed)
Subjective:    Patient ID: Kristin Wells, female    DOB: 04-Dec-1950, 64 y.o.   MRN: [id number]  HPI   64 year old patient with Diabetes mellitus who was admitted to [facility] with Staphylococcus Aureus Bacteremia. At the time she was thought to be suffering from CAP and now other source had been identified.   She did not have TEE to rule out endocarditis so we have decided to go with 4 weeks of IV antibiotics from date of first negative blood culture.  AT last visit she told   me that her first two toes and in particular her big toe have been hurting chronically and that there has been pus emanating from the big toenail from time to time.  We obtained plain films that did NOT show osteomyelitis and tried to obtain MRI but this was not yet done. No more drainage from toenails soaking in epzom salts.  No fevers or chills or malaise. Has had some nausea without vomiting.   Review of Systems  Constitutional: Negative for fever, chills, diaphoresis, activity change, appetite change, fatigue and unexpected weight change.  HENT: Negative for congestion, rhinorrhea, sinus pressure, sneezing, sore throat and trouble swallowing.   Eyes: Negative for photophobia and visual disturbance.  Respiratory: Negative for cough, chest tightness, shortness of breath, wheezing and stridor.   Cardiovascular: Negative for chest pain, palpitations and leg swelling.  Gastrointestinal: Negative for nausea, vomiting, abdominal pain, diarrhea, constipation, blood in stool, abdominal distention and anal bleeding.  Genitourinary: Negative for dysuria, hematuria, flank pain and difficulty urinating.  Musculoskeletal: Negative for myalgias, back pain, joint swelling, arthralgias and gait problem.  Skin: Negative for color change, pallor, rash and wound.  Neurological: Negative for dizziness, tremors, weakness and light-headedness.  Hematological: Negative for adenopathy. Does not bruise/bleed easily.    Psychiatric/Behavioral: Negative for behavioral problems, confusion, sleep disturbance, dysphoric mood, decreased concentration and agitation.       Objective:   Physical Exam  Constitutional: She is oriented to person, place, and time. She appears well-developed and well-nourished. No distress.  HENT:  Head: Normocephalic and atraumatic.  Mouth/Throat: No oropharyngeal exudate.  Eyes: Conjunctivae and EOM are normal. No scleral icterus.  Neck: Normal range of motion. Neck supple.  Cardiovascular: Normal rate and regular rhythm.   Pulmonary/Chest: Effort normal. No respiratory distress. She has no wheezes.  Abdominal: She exhibits no distension.  Neurological: She is alert and oriented to person, place, and time. She exhibits normal muscle tone. Coordination normal.  Skin: Skin is warm and dry. No rash noted. She is not diaphoretic. There is erythema. No pallor.  Psychiatric: She has a normal mood and affect. Her behavior is normal. Judgment and thought content normal.    Left foot 04/30/14: dorsal and ventral view. Onychomycosis big toenail and area medially where she states there is pus emanating from time to time    January 20th, 2016    Left foot in December 04/30/14:     Right foot 04/30/14:       January 20th, 2016:           Assessment & Plan:    Staphylococcus Aureus Bacteremia: I am beginning to wonder if her toe could be a source  We tried to get MRI of left FOOT but I believe pre-auth was denied, will investigate and if cannot will otherwise observe  #2 Diabetic foot infection: --plain films of foot and if normal trying to get  MRI with contrast  #3 Onychomycosis:  give lamisil x 3 months

## 2014-05-29 DIAGNOSIS — G589 Mononeuropathy, unspecified: Secondary | ICD-10-CM | POA: Diagnosis not present

## 2014-05-29 DIAGNOSIS — E119 Type 2 diabetes mellitus without complications: Secondary | ICD-10-CM | POA: Diagnosis not present

## 2014-05-29 DIAGNOSIS — M179 Osteoarthritis of knee, unspecified: Secondary | ICD-10-CM | POA: Diagnosis not present

## 2014-05-30 DIAGNOSIS — G589 Mononeuropathy, unspecified: Secondary | ICD-10-CM | POA: Diagnosis not present

## 2014-05-30 DIAGNOSIS — M179 Osteoarthritis of knee, unspecified: Secondary | ICD-10-CM | POA: Diagnosis not present

## 2014-05-30 DIAGNOSIS — E119 Type 2 diabetes mellitus without complications: Secondary | ICD-10-CM | POA: Diagnosis not present

## 2014-05-31 DIAGNOSIS — E119 Type 2 diabetes mellitus without complications: Secondary | ICD-10-CM | POA: Diagnosis not present

## 2014-05-31 DIAGNOSIS — M179 Osteoarthritis of knee, unspecified: Secondary | ICD-10-CM | POA: Diagnosis not present

## 2014-05-31 DIAGNOSIS — G589 Mononeuropathy, unspecified: Secondary | ICD-10-CM | POA: Diagnosis not present

## 2014-06-01 DIAGNOSIS — M179 Osteoarthritis of knee, unspecified: Secondary | ICD-10-CM | POA: Diagnosis not present

## 2014-06-01 DIAGNOSIS — G589 Mononeuropathy, unspecified: Secondary | ICD-10-CM | POA: Diagnosis not present

## 2014-06-01 DIAGNOSIS — E119 Type 2 diabetes mellitus without complications: Secondary | ICD-10-CM | POA: Diagnosis not present

## 2014-06-02 DIAGNOSIS — M179 Osteoarthritis of knee, unspecified: Secondary | ICD-10-CM | POA: Diagnosis not present

## 2014-06-02 DIAGNOSIS — E119 Type 2 diabetes mellitus without complications: Secondary | ICD-10-CM | POA: Diagnosis not present

## 2014-06-02 DIAGNOSIS — G589 Mononeuropathy, unspecified: Secondary | ICD-10-CM | POA: Diagnosis not present

## 2014-06-03 DIAGNOSIS — E119 Type 2 diabetes mellitus without complications: Secondary | ICD-10-CM | POA: Diagnosis not present

## 2014-06-03 DIAGNOSIS — G589 Mononeuropathy, unspecified: Secondary | ICD-10-CM | POA: Diagnosis not present

## 2014-06-03 DIAGNOSIS — M179 Osteoarthritis of knee, unspecified: Secondary | ICD-10-CM | POA: Diagnosis not present

## 2014-06-04 DIAGNOSIS — G589 Mononeuropathy, unspecified: Secondary | ICD-10-CM | POA: Diagnosis not present

## 2014-06-04 DIAGNOSIS — M179 Osteoarthritis of knee, unspecified: Secondary | ICD-10-CM | POA: Diagnosis not present

## 2014-06-04 DIAGNOSIS — E119 Type 2 diabetes mellitus without complications: Secondary | ICD-10-CM | POA: Diagnosis not present

## 2014-06-05 ENCOUNTER — Encounter: Payer: Self-pay | Admitting: Infectious Disease

## 2014-06-05 DIAGNOSIS — E119 Type 2 diabetes mellitus without complications: Secondary | ICD-10-CM | POA: Diagnosis not present

## 2014-06-05 DIAGNOSIS — J9690 Respiratory failure, unspecified, unspecified whether with hypoxia or hypercapnia: Secondary | ICD-10-CM | POA: Diagnosis not present

## 2014-06-05 DIAGNOSIS — M179 Osteoarthritis of knee, unspecified: Secondary | ICD-10-CM | POA: Diagnosis not present

## 2014-06-05 DIAGNOSIS — G589 Mononeuropathy, unspecified: Secondary | ICD-10-CM | POA: Diagnosis not present

## 2014-06-06 DIAGNOSIS — G589 Mononeuropathy, unspecified: Secondary | ICD-10-CM | POA: Diagnosis not present

## 2014-06-06 DIAGNOSIS — E119 Type 2 diabetes mellitus without complications: Secondary | ICD-10-CM | POA: Diagnosis not present

## 2014-06-06 DIAGNOSIS — M179 Osteoarthritis of knee, unspecified: Secondary | ICD-10-CM | POA: Diagnosis not present

## 2014-06-07 DIAGNOSIS — G589 Mononeuropathy, unspecified: Secondary | ICD-10-CM | POA: Diagnosis not present

## 2014-06-07 DIAGNOSIS — E119 Type 2 diabetes mellitus without complications: Secondary | ICD-10-CM | POA: Diagnosis not present

## 2014-06-07 DIAGNOSIS — M179 Osteoarthritis of knee, unspecified: Secondary | ICD-10-CM | POA: Diagnosis not present

## 2014-06-08 DIAGNOSIS — E119 Type 2 diabetes mellitus without complications: Secondary | ICD-10-CM | POA: Diagnosis not present

## 2014-06-08 DIAGNOSIS — M179 Osteoarthritis of knee, unspecified: Secondary | ICD-10-CM | POA: Diagnosis not present

## 2014-06-08 DIAGNOSIS — G589 Mononeuropathy, unspecified: Secondary | ICD-10-CM | POA: Diagnosis not present

## 2014-06-09 DIAGNOSIS — G589 Mononeuropathy, unspecified: Secondary | ICD-10-CM | POA: Diagnosis not present

## 2014-06-09 DIAGNOSIS — M179 Osteoarthritis of knee, unspecified: Secondary | ICD-10-CM | POA: Diagnosis not present

## 2014-06-09 DIAGNOSIS — E119 Type 2 diabetes mellitus without complications: Secondary | ICD-10-CM | POA: Diagnosis not present

## 2014-06-10 DIAGNOSIS — G589 Mononeuropathy, unspecified: Secondary | ICD-10-CM | POA: Diagnosis not present

## 2014-06-10 DIAGNOSIS — M179 Osteoarthritis of knee, unspecified: Secondary | ICD-10-CM | POA: Diagnosis not present

## 2014-06-10 DIAGNOSIS — E119 Type 2 diabetes mellitus without complications: Secondary | ICD-10-CM | POA: Diagnosis not present

## 2014-06-11 ENCOUNTER — Telehealth: Payer: Self-pay | Admitting: Licensed Clinical Social Worker

## 2014-06-11 DIAGNOSIS — M179 Osteoarthritis of knee, unspecified: Secondary | ICD-10-CM | POA: Diagnosis not present

## 2014-06-11 DIAGNOSIS — E119 Type 2 diabetes mellitus without complications: Secondary | ICD-10-CM | POA: Diagnosis not present

## 2014-06-11 DIAGNOSIS — G589 Mononeuropathy, unspecified: Secondary | ICD-10-CM | POA: Diagnosis not present

## 2014-06-11 NOTE — Telephone Encounter (Signed)
Left message that patient has an appointment on 06/30/14 at 2 pm, she will need a recent creatinine drawn. I asked the patient to call me back to see if she would like to come here and have it drawn or arrive 1 hour early before her appointment on 2/22.

## 2014-06-12 DIAGNOSIS — E119 Type 2 diabetes mellitus without complications: Secondary | ICD-10-CM | POA: Diagnosis not present

## 2014-06-12 DIAGNOSIS — M179 Osteoarthritis of knee, unspecified: Secondary | ICD-10-CM | POA: Diagnosis not present

## 2014-06-12 DIAGNOSIS — G589 Mononeuropathy, unspecified: Secondary | ICD-10-CM | POA: Diagnosis not present

## 2014-06-13 DIAGNOSIS — M179 Osteoarthritis of knee, unspecified: Secondary | ICD-10-CM | POA: Diagnosis not present

## 2014-06-13 DIAGNOSIS — E119 Type 2 diabetes mellitus without complications: Secondary | ICD-10-CM | POA: Diagnosis not present

## 2014-06-13 DIAGNOSIS — G589 Mononeuropathy, unspecified: Secondary | ICD-10-CM | POA: Diagnosis not present

## 2014-06-14 DIAGNOSIS — G589 Mononeuropathy, unspecified: Secondary | ICD-10-CM | POA: Diagnosis not present

## 2014-06-14 DIAGNOSIS — E119 Type 2 diabetes mellitus without complications: Secondary | ICD-10-CM | POA: Diagnosis not present

## 2014-06-14 DIAGNOSIS — M179 Osteoarthritis of knee, unspecified: Secondary | ICD-10-CM | POA: Diagnosis not present

## 2014-06-15 DIAGNOSIS — G589 Mononeuropathy, unspecified: Secondary | ICD-10-CM | POA: Diagnosis not present

## 2014-06-15 DIAGNOSIS — E119 Type 2 diabetes mellitus without complications: Secondary | ICD-10-CM | POA: Diagnosis not present

## 2014-06-15 DIAGNOSIS — M179 Osteoarthritis of knee, unspecified: Secondary | ICD-10-CM | POA: Diagnosis not present

## 2014-06-16 DIAGNOSIS — E119 Type 2 diabetes mellitus without complications: Secondary | ICD-10-CM | POA: Diagnosis not present

## 2014-06-16 DIAGNOSIS — G589 Mononeuropathy, unspecified: Secondary | ICD-10-CM | POA: Diagnosis not present

## 2014-06-16 DIAGNOSIS — M179 Osteoarthritis of knee, unspecified: Secondary | ICD-10-CM | POA: Diagnosis not present

## 2014-06-17 DIAGNOSIS — G589 Mononeuropathy, unspecified: Secondary | ICD-10-CM | POA: Diagnosis not present

## 2014-06-17 DIAGNOSIS — M179 Osteoarthritis of knee, unspecified: Secondary | ICD-10-CM | POA: Diagnosis not present

## 2014-06-17 DIAGNOSIS — E119 Type 2 diabetes mellitus without complications: Secondary | ICD-10-CM | POA: Diagnosis not present

## 2014-06-18 DIAGNOSIS — G589 Mononeuropathy, unspecified: Secondary | ICD-10-CM | POA: Diagnosis not present

## 2014-06-18 DIAGNOSIS — M179 Osteoarthritis of knee, unspecified: Secondary | ICD-10-CM | POA: Diagnosis not present

## 2014-06-18 DIAGNOSIS — E119 Type 2 diabetes mellitus without complications: Secondary | ICD-10-CM | POA: Diagnosis not present

## 2014-06-19 DIAGNOSIS — G589 Mononeuropathy, unspecified: Secondary | ICD-10-CM | POA: Diagnosis not present

## 2014-06-19 DIAGNOSIS — M179 Osteoarthritis of knee, unspecified: Secondary | ICD-10-CM | POA: Diagnosis not present

## 2014-06-19 DIAGNOSIS — E119 Type 2 diabetes mellitus without complications: Secondary | ICD-10-CM | POA: Diagnosis not present

## 2014-06-20 DIAGNOSIS — M179 Osteoarthritis of knee, unspecified: Secondary | ICD-10-CM | POA: Diagnosis not present

## 2014-06-20 DIAGNOSIS — G589 Mononeuropathy, unspecified: Secondary | ICD-10-CM | POA: Diagnosis not present

## 2014-06-20 DIAGNOSIS — E119 Type 2 diabetes mellitus without complications: Secondary | ICD-10-CM | POA: Diagnosis not present

## 2014-06-21 DIAGNOSIS — M179 Osteoarthritis of knee, unspecified: Secondary | ICD-10-CM | POA: Diagnosis not present

## 2014-06-21 DIAGNOSIS — G589 Mononeuropathy, unspecified: Secondary | ICD-10-CM | POA: Diagnosis not present

## 2014-06-21 DIAGNOSIS — E119 Type 2 diabetes mellitus without complications: Secondary | ICD-10-CM | POA: Diagnosis not present

## 2014-06-22 DIAGNOSIS — M179 Osteoarthritis of knee, unspecified: Secondary | ICD-10-CM | POA: Diagnosis not present

## 2014-06-22 DIAGNOSIS — E119 Type 2 diabetes mellitus without complications: Secondary | ICD-10-CM | POA: Diagnosis not present

## 2014-06-22 DIAGNOSIS — G589 Mononeuropathy, unspecified: Secondary | ICD-10-CM | POA: Diagnosis not present

## 2014-06-23 DIAGNOSIS — M179 Osteoarthritis of knee, unspecified: Secondary | ICD-10-CM | POA: Diagnosis not present

## 2014-06-23 DIAGNOSIS — G589 Mononeuropathy, unspecified: Secondary | ICD-10-CM | POA: Diagnosis not present

## 2014-06-23 DIAGNOSIS — E119 Type 2 diabetes mellitus without complications: Secondary | ICD-10-CM | POA: Diagnosis not present

## 2014-06-24 DIAGNOSIS — M179 Osteoarthritis of knee, unspecified: Secondary | ICD-10-CM | POA: Diagnosis not present

## 2014-06-24 DIAGNOSIS — E119 Type 2 diabetes mellitus without complications: Secondary | ICD-10-CM | POA: Diagnosis not present

## 2014-06-24 DIAGNOSIS — G589 Mononeuropathy, unspecified: Secondary | ICD-10-CM | POA: Diagnosis not present

## 2014-06-25 DIAGNOSIS — I119 Hypertensive heart disease without heart failure: Secondary | ICD-10-CM | POA: Diagnosis not present

## 2014-06-25 DIAGNOSIS — G589 Mononeuropathy, unspecified: Secondary | ICD-10-CM | POA: Diagnosis not present

## 2014-06-25 DIAGNOSIS — M179 Osteoarthritis of knee, unspecified: Secondary | ICD-10-CM | POA: Diagnosis not present

## 2014-06-25 DIAGNOSIS — F419 Anxiety disorder, unspecified: Secondary | ICD-10-CM | POA: Diagnosis not present

## 2014-06-25 DIAGNOSIS — I10 Essential (primary) hypertension: Secondary | ICD-10-CM | POA: Diagnosis not present

## 2014-06-25 DIAGNOSIS — J449 Chronic obstructive pulmonary disease, unspecified: Secondary | ICD-10-CM | POA: Diagnosis not present

## 2014-06-25 DIAGNOSIS — E119 Type 2 diabetes mellitus without complications: Secondary | ICD-10-CM | POA: Diagnosis not present

## 2014-06-26 DIAGNOSIS — E119 Type 2 diabetes mellitus without complications: Secondary | ICD-10-CM | POA: Diagnosis not present

## 2014-06-26 DIAGNOSIS — M179 Osteoarthritis of knee, unspecified: Secondary | ICD-10-CM | POA: Diagnosis not present

## 2014-06-26 DIAGNOSIS — G589 Mononeuropathy, unspecified: Secondary | ICD-10-CM | POA: Diagnosis not present

## 2014-06-27 DIAGNOSIS — G589 Mononeuropathy, unspecified: Secondary | ICD-10-CM | POA: Diagnosis not present

## 2014-06-27 DIAGNOSIS — M179 Osteoarthritis of knee, unspecified: Secondary | ICD-10-CM | POA: Diagnosis not present

## 2014-06-27 DIAGNOSIS — E119 Type 2 diabetes mellitus without complications: Secondary | ICD-10-CM | POA: Diagnosis not present

## 2014-06-28 DIAGNOSIS — E119 Type 2 diabetes mellitus without complications: Secondary | ICD-10-CM | POA: Diagnosis not present

## 2014-06-28 DIAGNOSIS — M179 Osteoarthritis of knee, unspecified: Secondary | ICD-10-CM | POA: Diagnosis not present

## 2014-06-28 DIAGNOSIS — G589 Mononeuropathy, unspecified: Secondary | ICD-10-CM | POA: Diagnosis not present

## 2014-06-29 DIAGNOSIS — M179 Osteoarthritis of knee, unspecified: Secondary | ICD-10-CM | POA: Diagnosis not present

## 2014-06-29 DIAGNOSIS — E119 Type 2 diabetes mellitus without complications: Secondary | ICD-10-CM | POA: Diagnosis not present

## 2014-06-29 DIAGNOSIS — G589 Mononeuropathy, unspecified: Secondary | ICD-10-CM | POA: Diagnosis not present

## 2014-06-30 ENCOUNTER — Other Ambulatory Visit: Payer: Self-pay | Admitting: Infectious Disease

## 2014-06-30 ENCOUNTER — Ambulatory Visit (HOSPITAL_COMMUNITY)
Admission: RE | Admit: 2014-06-30 | Discharge: 2014-06-30 | Disposition: A | Discharge status: Home / Self Care | Payer: Medicare Other | Source: Ambulatory Visit | Attending: Infectious Disease | Admitting: Infectious Disease

## 2014-06-30 DIAGNOSIS — E119 Type 2 diabetes mellitus without complications: Secondary | ICD-10-CM | POA: Diagnosis not present

## 2014-06-30 DIAGNOSIS — E08621 Diabetes mellitus due to underlying condition with foot ulcer: Secondary | ICD-10-CM

## 2014-06-30 DIAGNOSIS — L97509 Non-pressure chronic ulcer of other part of unspecified foot with unspecified severity: Principal | ICD-10-CM

## 2014-06-30 DIAGNOSIS — M179 Osteoarthritis of knee, unspecified: Secondary | ICD-10-CM | POA: Diagnosis not present

## 2014-06-30 DIAGNOSIS — Z1231 Encounter for screening mammogram for malignant neoplasm of breast: Secondary | ICD-10-CM

## 2014-06-30 DIAGNOSIS — G589 Mononeuropathy, unspecified: Secondary | ICD-10-CM | POA: Diagnosis not present

## 2014-07-01 DIAGNOSIS — E119 Type 2 diabetes mellitus without complications: Secondary | ICD-10-CM | POA: Diagnosis not present

## 2014-07-01 DIAGNOSIS — G589 Mononeuropathy, unspecified: Secondary | ICD-10-CM | POA: Diagnosis not present

## 2014-07-01 DIAGNOSIS — M179 Osteoarthritis of knee, unspecified: Secondary | ICD-10-CM | POA: Diagnosis not present

## 2014-07-02 ENCOUNTER — Other Ambulatory Visit: Payer: Self-pay | Admitting: Infectious Disease

## 2014-07-02 DIAGNOSIS — G589 Mononeuropathy, unspecified: Secondary | ICD-10-CM | POA: Diagnosis not present

## 2014-07-02 DIAGNOSIS — E119 Type 2 diabetes mellitus without complications: Secondary | ICD-10-CM | POA: Diagnosis not present

## 2014-07-02 DIAGNOSIS — L97529 Non-pressure chronic ulcer of other part of left foot with unspecified severity: Principal | ICD-10-CM

## 2014-07-02 DIAGNOSIS — E11621 Type 2 diabetes mellitus with foot ulcer: Secondary | ICD-10-CM

## 2014-07-02 DIAGNOSIS — M179 Osteoarthritis of knee, unspecified: Secondary | ICD-10-CM | POA: Diagnosis not present

## 2014-07-02 NOTE — Telephone Encounter (Signed)
Per patient rescheduled at [facility] for 07/11/14 at 11:30 AM.

## 2014-07-03 DIAGNOSIS — M179 Osteoarthritis of knee, unspecified: Secondary | ICD-10-CM | POA: Diagnosis not present

## 2014-07-03 DIAGNOSIS — G589 Mononeuropathy, unspecified: Secondary | ICD-10-CM | POA: Diagnosis not present

## 2014-07-03 DIAGNOSIS — E119 Type 2 diabetes mellitus without complications: Secondary | ICD-10-CM | POA: Diagnosis not present

## 2014-07-04 DIAGNOSIS — M179 Osteoarthritis of knee, unspecified: Secondary | ICD-10-CM | POA: Diagnosis not present

## 2014-07-04 DIAGNOSIS — G589 Mononeuropathy, unspecified: Secondary | ICD-10-CM | POA: Diagnosis not present

## 2014-07-04 DIAGNOSIS — E119 Type 2 diabetes mellitus without complications: Secondary | ICD-10-CM | POA: Diagnosis not present

## 2014-07-05 DIAGNOSIS — M179 Osteoarthritis of knee, unspecified: Secondary | ICD-10-CM | POA: Diagnosis not present

## 2014-07-05 DIAGNOSIS — E119 Type 2 diabetes mellitus without complications: Secondary | ICD-10-CM | POA: Diagnosis not present

## 2014-07-05 DIAGNOSIS — G589 Mononeuropathy, unspecified: Secondary | ICD-10-CM | POA: Diagnosis not present

## 2014-07-06 DIAGNOSIS — M179 Osteoarthritis of knee, unspecified: Secondary | ICD-10-CM | POA: Diagnosis not present

## 2014-07-06 DIAGNOSIS — G589 Mononeuropathy, unspecified: Secondary | ICD-10-CM | POA: Diagnosis not present

## 2014-07-06 DIAGNOSIS — J9690 Respiratory failure, unspecified, unspecified whether with hypoxia or hypercapnia: Secondary | ICD-10-CM | POA: Diagnosis not present

## 2014-07-06 DIAGNOSIS — E119 Type 2 diabetes mellitus without complications: Secondary | ICD-10-CM | POA: Diagnosis not present

## 2014-07-07 ENCOUNTER — Ambulatory Visit (HOSPITAL_COMMUNITY)
Admission: RE | Admit: 2014-07-07 | Discharge: 2014-07-07 | Disposition: A | Discharge status: Home / Self Care | Payer: Medicare Other | Source: Ambulatory Visit | Attending: Infectious Disease | Admitting: Infectious Disease

## 2014-07-07 DIAGNOSIS — Z1231 Encounter for screening mammogram for malignant neoplasm of breast: Secondary | ICD-10-CM | POA: Diagnosis not present

## 2014-07-07 DIAGNOSIS — M179 Osteoarthritis of knee, unspecified: Secondary | ICD-10-CM | POA: Diagnosis not present

## 2014-07-07 DIAGNOSIS — G589 Mononeuropathy, unspecified: Secondary | ICD-10-CM | POA: Diagnosis not present

## 2014-07-07 DIAGNOSIS — E119 Type 2 diabetes mellitus without complications: Secondary | ICD-10-CM | POA: Diagnosis not present

## 2014-07-08 DIAGNOSIS — E119 Type 2 diabetes mellitus without complications: Secondary | ICD-10-CM | POA: Diagnosis not present

## 2014-07-08 DIAGNOSIS — G589 Mononeuropathy, unspecified: Secondary | ICD-10-CM | POA: Diagnosis not present

## 2014-07-08 DIAGNOSIS — M179 Osteoarthritis of knee, unspecified: Secondary | ICD-10-CM | POA: Diagnosis not present

## 2014-07-09 DIAGNOSIS — M179 Osteoarthritis of knee, unspecified: Secondary | ICD-10-CM | POA: Diagnosis not present

## 2014-07-09 DIAGNOSIS — G589 Mononeuropathy, unspecified: Secondary | ICD-10-CM | POA: Diagnosis not present

## 2014-07-09 DIAGNOSIS — E119 Type 2 diabetes mellitus without complications: Secondary | ICD-10-CM | POA: Diagnosis not present

## 2014-07-10 DIAGNOSIS — G589 Mononeuropathy, unspecified: Secondary | ICD-10-CM | POA: Diagnosis not present

## 2014-07-10 DIAGNOSIS — E119 Type 2 diabetes mellitus without complications: Secondary | ICD-10-CM | POA: Diagnosis not present

## 2014-07-10 DIAGNOSIS — M179 Osteoarthritis of knee, unspecified: Secondary | ICD-10-CM | POA: Diagnosis not present

## 2014-07-11 ENCOUNTER — Ambulatory Visit (HOSPITAL_COMMUNITY)
Admission: RE | Admit: 2014-07-11 | Discharge: 2014-07-11 | Disposition: A | Discharge status: Home / Self Care | Payer: Medicare Other | Source: Ambulatory Visit | Attending: Infectious Disease | Admitting: Infectious Disease

## 2014-07-11 ENCOUNTER — Encounter (HOSPITAL_COMMUNITY): Payer: Self-pay

## 2014-07-11 DIAGNOSIS — L97529 Non-pressure chronic ulcer of other part of left foot with unspecified severity: Secondary | ICD-10-CM | POA: Insufficient documentation

## 2014-07-11 DIAGNOSIS — M179 Osteoarthritis of knee, unspecified: Secondary | ICD-10-CM | POA: Diagnosis not present

## 2014-07-11 DIAGNOSIS — L97521 Non-pressure chronic ulcer of other part of left foot limited to breakdown of skin: Secondary | ICD-10-CM | POA: Diagnosis not present

## 2014-07-11 DIAGNOSIS — M19072 Primary osteoarthritis, left ankle and foot: Secondary | ICD-10-CM | POA: Diagnosis not present

## 2014-07-11 DIAGNOSIS — E11621 Type 2 diabetes mellitus with foot ulcer: Secondary | ICD-10-CM | POA: Diagnosis not present

## 2014-07-11 DIAGNOSIS — G589 Mononeuropathy, unspecified: Secondary | ICD-10-CM | POA: Diagnosis not present

## 2014-07-11 DIAGNOSIS — E119 Type 2 diabetes mellitus without complications: Secondary | ICD-10-CM | POA: Diagnosis not present

## 2014-07-11 LAB — POCT I-STAT CREATININE: Creatinine, Ser: 0.8 mg/dL (ref 0.50–1.10)

## 2014-07-11 MED ORDER — GADOBENATE DIMEGLUMINE 529 MG/ML IV SOLN
12.0000 mL | Freq: Once | INTRAVENOUS | Status: AC | PRN
  Administered 2014-07-11: 12 mL via INTRAVENOUS

## 2014-07-12 DIAGNOSIS — G589 Mononeuropathy, unspecified: Secondary | ICD-10-CM | POA: Diagnosis not present

## 2014-07-12 DIAGNOSIS — M179 Osteoarthritis of knee, unspecified: Secondary | ICD-10-CM | POA: Diagnosis not present

## 2014-07-12 DIAGNOSIS — E119 Type 2 diabetes mellitus without complications: Secondary | ICD-10-CM | POA: Diagnosis not present

## 2014-07-13 DIAGNOSIS — G589 Mononeuropathy, unspecified: Secondary | ICD-10-CM | POA: Diagnosis not present

## 2014-07-13 DIAGNOSIS — M179 Osteoarthritis of knee, unspecified: Secondary | ICD-10-CM | POA: Diagnosis not present

## 2014-07-13 DIAGNOSIS — E119 Type 2 diabetes mellitus without complications: Secondary | ICD-10-CM | POA: Diagnosis not present

## 2014-07-14 DIAGNOSIS — M179 Osteoarthritis of knee, unspecified: Secondary | ICD-10-CM | POA: Diagnosis not present

## 2014-07-14 DIAGNOSIS — G589 Mononeuropathy, unspecified: Secondary | ICD-10-CM | POA: Diagnosis not present

## 2014-07-14 DIAGNOSIS — E119 Type 2 diabetes mellitus without complications: Secondary | ICD-10-CM | POA: Diagnosis not present

## 2014-07-15 DIAGNOSIS — G589 Mononeuropathy, unspecified: Secondary | ICD-10-CM | POA: Diagnosis not present

## 2014-07-15 DIAGNOSIS — E119 Type 2 diabetes mellitus without complications: Secondary | ICD-10-CM | POA: Diagnosis not present

## 2014-07-15 DIAGNOSIS — M179 Osteoarthritis of knee, unspecified: Secondary | ICD-10-CM | POA: Diagnosis not present

## 2014-07-16 DIAGNOSIS — G589 Mononeuropathy, unspecified: Secondary | ICD-10-CM | POA: Diagnosis not present

## 2014-07-16 DIAGNOSIS — E119 Type 2 diabetes mellitus without complications: Secondary | ICD-10-CM | POA: Diagnosis not present

## 2014-07-16 DIAGNOSIS — M179 Osteoarthritis of knee, unspecified: Secondary | ICD-10-CM | POA: Diagnosis not present

## 2014-07-17 DIAGNOSIS — E119 Type 2 diabetes mellitus without complications: Secondary | ICD-10-CM | POA: Diagnosis not present

## 2014-07-17 DIAGNOSIS — G589 Mononeuropathy, unspecified: Secondary | ICD-10-CM | POA: Diagnosis not present

## 2014-07-17 DIAGNOSIS — M179 Osteoarthritis of knee, unspecified: Secondary | ICD-10-CM | POA: Diagnosis not present

## 2014-07-18 DIAGNOSIS — E119 Type 2 diabetes mellitus without complications: Secondary | ICD-10-CM | POA: Diagnosis not present

## 2014-07-18 DIAGNOSIS — G589 Mononeuropathy, unspecified: Secondary | ICD-10-CM | POA: Diagnosis not present

## 2014-07-18 DIAGNOSIS — M179 Osteoarthritis of knee, unspecified: Secondary | ICD-10-CM | POA: Diagnosis not present

## 2014-07-19 DIAGNOSIS — M179 Osteoarthritis of knee, unspecified: Secondary | ICD-10-CM | POA: Diagnosis not present

## 2014-07-19 DIAGNOSIS — E119 Type 2 diabetes mellitus without complications: Secondary | ICD-10-CM | POA: Diagnosis not present

## 2014-07-19 DIAGNOSIS — G589 Mononeuropathy, unspecified: Secondary | ICD-10-CM | POA: Diagnosis not present

## 2014-07-20 DIAGNOSIS — M179 Osteoarthritis of knee, unspecified: Secondary | ICD-10-CM | POA: Diagnosis not present

## 2014-07-20 DIAGNOSIS — E119 Type 2 diabetes mellitus without complications: Secondary | ICD-10-CM | POA: Diagnosis not present

## 2014-07-20 DIAGNOSIS — G589 Mononeuropathy, unspecified: Secondary | ICD-10-CM | POA: Diagnosis not present

## 2014-07-21 DIAGNOSIS — M179 Osteoarthritis of knee, unspecified: Secondary | ICD-10-CM | POA: Diagnosis not present

## 2014-07-21 DIAGNOSIS — G589 Mononeuropathy, unspecified: Secondary | ICD-10-CM | POA: Diagnosis not present

## 2014-07-21 DIAGNOSIS — E119 Type 2 diabetes mellitus without complications: Secondary | ICD-10-CM | POA: Diagnosis not present

## 2014-07-22 DIAGNOSIS — M179 Osteoarthritis of knee, unspecified: Secondary | ICD-10-CM | POA: Diagnosis not present

## 2014-07-22 DIAGNOSIS — E119 Type 2 diabetes mellitus without complications: Secondary | ICD-10-CM | POA: Diagnosis not present

## 2014-07-22 DIAGNOSIS — G589 Mononeuropathy, unspecified: Secondary | ICD-10-CM | POA: Diagnosis not present

## 2014-07-23 DIAGNOSIS — M179 Osteoarthritis of knee, unspecified: Secondary | ICD-10-CM | POA: Diagnosis not present

## 2014-07-23 DIAGNOSIS — E119 Type 2 diabetes mellitus without complications: Secondary | ICD-10-CM | POA: Diagnosis not present

## 2014-07-23 DIAGNOSIS — G589 Mononeuropathy, unspecified: Secondary | ICD-10-CM | POA: Diagnosis not present

## 2014-07-24 DIAGNOSIS — G589 Mononeuropathy, unspecified: Secondary | ICD-10-CM | POA: Diagnosis not present

## 2014-07-24 DIAGNOSIS — M179 Osteoarthritis of knee, unspecified: Secondary | ICD-10-CM | POA: Diagnosis not present

## 2014-07-24 DIAGNOSIS — E119 Type 2 diabetes mellitus without complications: Secondary | ICD-10-CM | POA: Diagnosis not present

## 2014-07-25 DIAGNOSIS — G589 Mononeuropathy, unspecified: Secondary | ICD-10-CM | POA: Diagnosis not present

## 2014-07-25 DIAGNOSIS — M179 Osteoarthritis of knee, unspecified: Secondary | ICD-10-CM | POA: Diagnosis not present

## 2014-07-25 DIAGNOSIS — E119 Type 2 diabetes mellitus without complications: Secondary | ICD-10-CM | POA: Diagnosis not present

## 2014-07-26 DIAGNOSIS — M179 Osteoarthritis of knee, unspecified: Secondary | ICD-10-CM | POA: Diagnosis not present

## 2014-07-26 DIAGNOSIS — E119 Type 2 diabetes mellitus without complications: Secondary | ICD-10-CM | POA: Diagnosis not present

## 2014-07-26 DIAGNOSIS — G589 Mononeuropathy, unspecified: Secondary | ICD-10-CM | POA: Diagnosis not present

## 2014-07-27 DIAGNOSIS — G589 Mononeuropathy, unspecified: Secondary | ICD-10-CM | POA: Diagnosis not present

## 2014-07-27 DIAGNOSIS — M179 Osteoarthritis of knee, unspecified: Secondary | ICD-10-CM | POA: Diagnosis not present

## 2014-07-27 DIAGNOSIS — E119 Type 2 diabetes mellitus without complications: Secondary | ICD-10-CM | POA: Diagnosis not present

## 2014-07-28 DIAGNOSIS — M179 Osteoarthritis of knee, unspecified: Secondary | ICD-10-CM | POA: Diagnosis not present

## 2014-07-28 DIAGNOSIS — E119 Type 2 diabetes mellitus without complications: Secondary | ICD-10-CM | POA: Diagnosis not present

## 2014-07-28 DIAGNOSIS — G589 Mononeuropathy, unspecified: Secondary | ICD-10-CM | POA: Diagnosis not present

## 2014-07-29 DIAGNOSIS — G589 Mononeuropathy, unspecified: Secondary | ICD-10-CM | POA: Diagnosis not present

## 2014-07-29 DIAGNOSIS — E119 Type 2 diabetes mellitus without complications: Secondary | ICD-10-CM | POA: Diagnosis not present

## 2014-07-29 DIAGNOSIS — M179 Osteoarthritis of knee, unspecified: Secondary | ICD-10-CM | POA: Diagnosis not present

## 2014-07-30 DIAGNOSIS — M179 Osteoarthritis of knee, unspecified: Secondary | ICD-10-CM | POA: Diagnosis not present

## 2014-07-30 DIAGNOSIS — G589 Mononeuropathy, unspecified: Secondary | ICD-10-CM | POA: Diagnosis not present

## 2014-07-30 DIAGNOSIS — E119 Type 2 diabetes mellitus without complications: Secondary | ICD-10-CM | POA: Diagnosis not present

## 2014-07-31 DIAGNOSIS — M179 Osteoarthritis of knee, unspecified: Secondary | ICD-10-CM | POA: Diagnosis not present

## 2014-07-31 DIAGNOSIS — E119 Type 2 diabetes mellitus without complications: Secondary | ICD-10-CM | POA: Diagnosis not present

## 2014-07-31 DIAGNOSIS — G589 Mononeuropathy, unspecified: Secondary | ICD-10-CM | POA: Diagnosis not present

## 2014-08-01 DIAGNOSIS — E119 Type 2 diabetes mellitus without complications: Secondary | ICD-10-CM | POA: Diagnosis not present

## 2014-08-01 DIAGNOSIS — G589 Mononeuropathy, unspecified: Secondary | ICD-10-CM | POA: Diagnosis not present

## 2014-08-01 DIAGNOSIS — M179 Osteoarthritis of knee, unspecified: Secondary | ICD-10-CM | POA: Diagnosis not present

## 2014-08-02 DIAGNOSIS — G589 Mononeuropathy, unspecified: Secondary | ICD-10-CM | POA: Diagnosis not present

## 2014-08-02 DIAGNOSIS — E119 Type 2 diabetes mellitus without complications: Secondary | ICD-10-CM | POA: Diagnosis not present

## 2014-08-02 DIAGNOSIS — M179 Osteoarthritis of knee, unspecified: Secondary | ICD-10-CM | POA: Diagnosis not present

## 2014-08-03 DIAGNOSIS — G589 Mononeuropathy, unspecified: Secondary | ICD-10-CM | POA: Diagnosis not present

## 2014-08-03 DIAGNOSIS — E119 Type 2 diabetes mellitus without complications: Secondary | ICD-10-CM | POA: Diagnosis not present

## 2014-08-03 DIAGNOSIS — M179 Osteoarthritis of knee, unspecified: Secondary | ICD-10-CM | POA: Diagnosis not present

## 2014-08-04 DIAGNOSIS — J9690 Respiratory failure, unspecified, unspecified whether with hypoxia or hypercapnia: Secondary | ICD-10-CM | POA: Diagnosis not present

## 2014-08-04 DIAGNOSIS — G589 Mononeuropathy, unspecified: Secondary | ICD-10-CM | POA: Diagnosis not present

## 2014-08-04 DIAGNOSIS — E119 Type 2 diabetes mellitus without complications: Secondary | ICD-10-CM | POA: Diagnosis not present

## 2014-08-04 DIAGNOSIS — M179 Osteoarthritis of knee, unspecified: Secondary | ICD-10-CM | POA: Diagnosis not present

## 2014-08-05 DIAGNOSIS — M179 Osteoarthritis of knee, unspecified: Secondary | ICD-10-CM | POA: Diagnosis not present

## 2014-08-05 DIAGNOSIS — E119 Type 2 diabetes mellitus without complications: Secondary | ICD-10-CM | POA: Diagnosis not present

## 2014-08-05 DIAGNOSIS — G589 Mononeuropathy, unspecified: Secondary | ICD-10-CM | POA: Diagnosis not present

## 2014-08-06 DIAGNOSIS — E119 Type 2 diabetes mellitus without complications: Secondary | ICD-10-CM | POA: Diagnosis not present

## 2014-08-06 DIAGNOSIS — I119 Hypertensive heart disease without heart failure: Secondary | ICD-10-CM | POA: Diagnosis not present

## 2014-08-06 DIAGNOSIS — G589 Mononeuropathy, unspecified: Secondary | ICD-10-CM | POA: Diagnosis not present

## 2014-08-06 DIAGNOSIS — M179 Osteoarthritis of knee, unspecified: Secondary | ICD-10-CM | POA: Diagnosis not present

## 2014-08-06 DIAGNOSIS — J449 Chronic obstructive pulmonary disease, unspecified: Secondary | ICD-10-CM | POA: Diagnosis not present

## 2014-08-07 DIAGNOSIS — J449 Chronic obstructive pulmonary disease, unspecified: Secondary | ICD-10-CM | POA: Diagnosis not present

## 2014-08-07 DIAGNOSIS — G589 Mononeuropathy, unspecified: Secondary | ICD-10-CM | POA: Diagnosis not present

## 2014-08-07 DIAGNOSIS — M179 Osteoarthritis of knee, unspecified: Secondary | ICD-10-CM | POA: Diagnosis not present

## 2014-08-07 DIAGNOSIS — M86179 Other acute osteomyelitis, unspecified ankle and foot: Secondary | ICD-10-CM | POA: Diagnosis not present

## 2014-08-07 DIAGNOSIS — E119 Type 2 diabetes mellitus without complications: Secondary | ICD-10-CM | POA: Diagnosis not present

## 2014-08-07 DIAGNOSIS — I119 Hypertensive heart disease without heart failure: Secondary | ICD-10-CM | POA: Diagnosis not present

## 2014-08-08 DIAGNOSIS — M179 Osteoarthritis of knee, unspecified: Secondary | ICD-10-CM | POA: Diagnosis not present

## 2014-08-08 DIAGNOSIS — G589 Mononeuropathy, unspecified: Secondary | ICD-10-CM | POA: Diagnosis not present

## 2014-08-08 DIAGNOSIS — E119 Type 2 diabetes mellitus without complications: Secondary | ICD-10-CM | POA: Diagnosis not present

## 2014-08-09 DIAGNOSIS — G589 Mononeuropathy, unspecified: Secondary | ICD-10-CM | POA: Diagnosis not present

## 2014-08-09 DIAGNOSIS — E119 Type 2 diabetes mellitus without complications: Secondary | ICD-10-CM | POA: Diagnosis not present

## 2014-08-09 DIAGNOSIS — M179 Osteoarthritis of knee, unspecified: Secondary | ICD-10-CM | POA: Diagnosis not present

## 2014-08-10 DIAGNOSIS — E119 Type 2 diabetes mellitus without complications: Secondary | ICD-10-CM | POA: Diagnosis not present

## 2014-08-10 DIAGNOSIS — G589 Mononeuropathy, unspecified: Secondary | ICD-10-CM | POA: Diagnosis not present

## 2014-08-10 DIAGNOSIS — M179 Osteoarthritis of knee, unspecified: Secondary | ICD-10-CM | POA: Diagnosis not present

## 2014-08-11 DIAGNOSIS — M179 Osteoarthritis of knee, unspecified: Secondary | ICD-10-CM | POA: Diagnosis not present

## 2014-08-11 DIAGNOSIS — E119 Type 2 diabetes mellitus without complications: Secondary | ICD-10-CM | POA: Diagnosis not present

## 2014-08-11 DIAGNOSIS — G589 Mononeuropathy, unspecified: Secondary | ICD-10-CM | POA: Diagnosis not present

## 2014-08-12 DIAGNOSIS — M179 Osteoarthritis of knee, unspecified: Secondary | ICD-10-CM | POA: Diagnosis not present

## 2014-08-12 DIAGNOSIS — G589 Mononeuropathy, unspecified: Secondary | ICD-10-CM | POA: Diagnosis not present

## 2014-08-12 DIAGNOSIS — E119 Type 2 diabetes mellitus without complications: Secondary | ICD-10-CM | POA: Diagnosis not present

## 2014-08-13 ENCOUNTER — Encounter: Payer: Self-pay | Admitting: *Deleted

## 2014-08-13 DIAGNOSIS — G589 Mononeuropathy, unspecified: Secondary | ICD-10-CM | POA: Diagnosis not present

## 2014-08-13 DIAGNOSIS — E119 Type 2 diabetes mellitus without complications: Secondary | ICD-10-CM | POA: Diagnosis not present

## 2014-08-13 DIAGNOSIS — M179 Osteoarthritis of knee, unspecified: Secondary | ICD-10-CM | POA: Diagnosis not present

## 2014-08-14 DIAGNOSIS — E119 Type 2 diabetes mellitus without complications: Secondary | ICD-10-CM | POA: Diagnosis not present

## 2014-08-14 DIAGNOSIS — G589 Mononeuropathy, unspecified: Secondary | ICD-10-CM | POA: Diagnosis not present

## 2014-08-14 DIAGNOSIS — M179 Osteoarthritis of knee, unspecified: Secondary | ICD-10-CM | POA: Diagnosis not present

## 2014-08-15 DIAGNOSIS — M179 Osteoarthritis of knee, unspecified: Secondary | ICD-10-CM | POA: Diagnosis not present

## 2014-08-15 DIAGNOSIS — E119 Type 2 diabetes mellitus without complications: Secondary | ICD-10-CM | POA: Diagnosis not present

## 2014-08-15 DIAGNOSIS — G589 Mononeuropathy, unspecified: Secondary | ICD-10-CM | POA: Diagnosis not present

## 2014-08-16 DIAGNOSIS — M179 Osteoarthritis of knee, unspecified: Secondary | ICD-10-CM | POA: Diagnosis not present

## 2014-08-16 DIAGNOSIS — G589 Mononeuropathy, unspecified: Secondary | ICD-10-CM | POA: Diagnosis not present

## 2014-08-16 DIAGNOSIS — E119 Type 2 diabetes mellitus without complications: Secondary | ICD-10-CM | POA: Diagnosis not present

## 2014-08-17 DIAGNOSIS — E119 Type 2 diabetes mellitus without complications: Secondary | ICD-10-CM | POA: Diagnosis not present

## 2014-08-17 DIAGNOSIS — M179 Osteoarthritis of knee, unspecified: Secondary | ICD-10-CM | POA: Diagnosis not present

## 2014-08-17 DIAGNOSIS — G589 Mononeuropathy, unspecified: Secondary | ICD-10-CM | POA: Diagnosis not present

## 2014-08-18 DIAGNOSIS — G589 Mononeuropathy, unspecified: Secondary | ICD-10-CM | POA: Diagnosis not present

## 2014-08-18 DIAGNOSIS — M179 Osteoarthritis of knee, unspecified: Secondary | ICD-10-CM | POA: Diagnosis not present

## 2014-08-18 DIAGNOSIS — E119 Type 2 diabetes mellitus without complications: Secondary | ICD-10-CM | POA: Diagnosis not present

## 2014-08-19 DIAGNOSIS — E119 Type 2 diabetes mellitus without complications: Secondary | ICD-10-CM | POA: Diagnosis not present

## 2014-08-19 DIAGNOSIS — M179 Osteoarthritis of knee, unspecified: Secondary | ICD-10-CM | POA: Diagnosis not present

## 2014-08-19 DIAGNOSIS — G589 Mononeuropathy, unspecified: Secondary | ICD-10-CM | POA: Diagnosis not present

## 2014-08-20 DIAGNOSIS — M179 Osteoarthritis of knee, unspecified: Secondary | ICD-10-CM | POA: Diagnosis not present

## 2014-08-20 DIAGNOSIS — G589 Mononeuropathy, unspecified: Secondary | ICD-10-CM | POA: Diagnosis not present

## 2014-08-20 DIAGNOSIS — E119 Type 2 diabetes mellitus without complications: Secondary | ICD-10-CM | POA: Diagnosis not present

## 2014-08-21 DIAGNOSIS — J449 Chronic obstructive pulmonary disease, unspecified: Secondary | ICD-10-CM | POA: Diagnosis not present

## 2014-08-21 DIAGNOSIS — E119 Type 2 diabetes mellitus without complications: Secondary | ICD-10-CM | POA: Diagnosis not present

## 2014-08-21 DIAGNOSIS — G589 Mononeuropathy, unspecified: Secondary | ICD-10-CM | POA: Diagnosis not present

## 2014-08-21 DIAGNOSIS — M179 Osteoarthritis of knee, unspecified: Secondary | ICD-10-CM | POA: Diagnosis not present

## 2014-08-22 DIAGNOSIS — M179 Osteoarthritis of knee, unspecified: Secondary | ICD-10-CM | POA: Diagnosis not present

## 2014-08-22 DIAGNOSIS — G589 Mononeuropathy, unspecified: Secondary | ICD-10-CM | POA: Diagnosis not present

## 2014-08-22 DIAGNOSIS — E119 Type 2 diabetes mellitus without complications: Secondary | ICD-10-CM | POA: Diagnosis not present

## 2014-08-23 DIAGNOSIS — M179 Osteoarthritis of knee, unspecified: Secondary | ICD-10-CM | POA: Diagnosis not present

## 2014-08-23 DIAGNOSIS — G589 Mononeuropathy, unspecified: Secondary | ICD-10-CM | POA: Diagnosis not present

## 2014-08-23 DIAGNOSIS — E119 Type 2 diabetes mellitus without complications: Secondary | ICD-10-CM | POA: Diagnosis not present

## 2014-08-24 DIAGNOSIS — E119 Type 2 diabetes mellitus without complications: Secondary | ICD-10-CM | POA: Diagnosis not present

## 2014-08-24 DIAGNOSIS — G589 Mononeuropathy, unspecified: Secondary | ICD-10-CM | POA: Diagnosis not present

## 2014-08-24 DIAGNOSIS — M179 Osteoarthritis of knee, unspecified: Secondary | ICD-10-CM | POA: Diagnosis not present

## 2014-08-25 DIAGNOSIS — E119 Type 2 diabetes mellitus without complications: Secondary | ICD-10-CM | POA: Diagnosis not present

## 2014-08-25 DIAGNOSIS — M179 Osteoarthritis of knee, unspecified: Secondary | ICD-10-CM | POA: Diagnosis not present

## 2014-08-25 DIAGNOSIS — G589 Mononeuropathy, unspecified: Secondary | ICD-10-CM | POA: Diagnosis not present

## 2014-08-26 DIAGNOSIS — M179 Osteoarthritis of knee, unspecified: Secondary | ICD-10-CM | POA: Diagnosis not present

## 2014-08-26 DIAGNOSIS — E119 Type 2 diabetes mellitus without complications: Secondary | ICD-10-CM | POA: Diagnosis not present

## 2014-08-26 DIAGNOSIS — G589 Mononeuropathy, unspecified: Secondary | ICD-10-CM | POA: Diagnosis not present

## 2014-08-27 ENCOUNTER — Ambulatory Visit: Payer: Medicare Other | Admitting: Infectious Disease

## 2014-08-27 ENCOUNTER — Encounter: Payer: Self-pay | Admitting: *Deleted

## 2014-09-03 DIAGNOSIS — R32 Unspecified urinary incontinence: Secondary | ICD-10-CM | POA: Diagnosis not present

## 2014-09-04 DIAGNOSIS — J9690 Respiratory failure, unspecified, unspecified whether with hypoxia or hypercapnia: Secondary | ICD-10-CM | POA: Diagnosis not present

## 2014-10-03 DIAGNOSIS — R32 Unspecified urinary incontinence: Secondary | ICD-10-CM | POA: Diagnosis not present

## 2014-10-04 DIAGNOSIS — J9690 Respiratory failure, unspecified, unspecified whether with hypoxia or hypercapnia: Secondary | ICD-10-CM | POA: Diagnosis not present

## 2014-10-16 DIAGNOSIS — J449 Chronic obstructive pulmonary disease, unspecified: Secondary | ICD-10-CM | POA: Diagnosis not present

## 2014-11-03 DIAGNOSIS — R32 Unspecified urinary incontinence: Secondary | ICD-10-CM | POA: Diagnosis not present

## 2014-11-04 DIAGNOSIS — J9690 Respiratory failure, unspecified, unspecified whether with hypoxia or hypercapnia: Secondary | ICD-10-CM | POA: Diagnosis not present

## 2014-12-03 DIAGNOSIS — R32 Unspecified urinary incontinence: Secondary | ICD-10-CM | POA: Diagnosis not present

## 2014-12-04 DIAGNOSIS — J9690 Respiratory failure, unspecified, unspecified whether with hypoxia or hypercapnia: Secondary | ICD-10-CM | POA: Diagnosis not present

## 2014-12-31 LAB — BLOOD GAS, ARTERIAL
Acid-Base Excess: 4.1 mmol/L — ABNORMAL HIGH (ref 0.0–2.0)
Bicarbonate: 27.3 mEq/L — ABNORMAL HIGH (ref 20.0–24.0)
Drawn by: 38235
FIO2: 0.4
MECHVT: 500 mL
O2 Saturation: 99.3 %
PEEP: 5 cmH2O
Patient temperature: 37
RATE: 16 resp/min
TCO2: 23.4 mmol/L (ref 0–100)
pCO2 arterial: 35.2 mmHg (ref 35.0–45.0)
pH, Arterial: 7.502 — ABNORMAL HIGH (ref 7.350–7.450)
pO2, Arterial: 133 mmHg — ABNORMAL HIGH (ref 80.0–100.0)

## 2015-01-02 DIAGNOSIS — R32 Unspecified urinary incontinence: Secondary | ICD-10-CM | POA: Diagnosis not present

## 2015-01-04 DIAGNOSIS — J9690 Respiratory failure, unspecified, unspecified whether with hypoxia or hypercapnia: Secondary | ICD-10-CM | POA: Diagnosis not present

## 2015-02-03 DIAGNOSIS — R32 Unspecified urinary incontinence: Secondary | ICD-10-CM | POA: Diagnosis not present

## 2015-02-04 DIAGNOSIS — J9690 Respiratory failure, unspecified, unspecified whether with hypoxia or hypercapnia: Secondary | ICD-10-CM | POA: Diagnosis not present

## 2015-02-17 DIAGNOSIS — I25119 Atherosclerotic heart disease of native coronary artery with unspecified angina pectoris: Secondary | ICD-10-CM | POA: Diagnosis not present

## 2015-02-17 DIAGNOSIS — Z23 Encounter for immunization: Secondary | ICD-10-CM | POA: Diagnosis not present

## 2015-02-17 DIAGNOSIS — J449 Chronic obstructive pulmonary disease, unspecified: Secondary | ICD-10-CM | POA: Diagnosis not present

## 2015-02-17 DIAGNOSIS — F329 Major depressive disorder, single episode, unspecified: Secondary | ICD-10-CM | POA: Diagnosis not present

## 2015-02-17 DIAGNOSIS — E1151 Type 2 diabetes mellitus with diabetic peripheral angiopathy without gangrene: Secondary | ICD-10-CM | POA: Diagnosis not present

## 2015-03-05 DIAGNOSIS — J441 Chronic obstructive pulmonary disease with (acute) exacerbation: Secondary | ICD-10-CM | POA: Diagnosis not present

## 2015-03-06 DIAGNOSIS — J9690 Respiratory failure, unspecified, unspecified whether with hypoxia or hypercapnia: Secondary | ICD-10-CM | POA: Diagnosis not present

## 2015-04-06 DIAGNOSIS — J9690 Respiratory failure, unspecified, unspecified whether with hypoxia or hypercapnia: Secondary | ICD-10-CM | POA: Diagnosis not present

## 2015-05-05 DIAGNOSIS — R32 Unspecified urinary incontinence: Secondary | ICD-10-CM | POA: Diagnosis not present

## 2015-05-06 DIAGNOSIS — J9690 Respiratory failure, unspecified, unspecified whether with hypoxia or hypercapnia: Secondary | ICD-10-CM | POA: Diagnosis not present

## 2015-05-20 DIAGNOSIS — E1151 Type 2 diabetes mellitus with diabetic peripheral angiopathy without gangrene: Secondary | ICD-10-CM | POA: Diagnosis not present

## 2015-05-20 DIAGNOSIS — I25119 Atherosclerotic heart disease of native coronary artery with unspecified angina pectoris: Secondary | ICD-10-CM | POA: Diagnosis not present

## 2015-05-20 DIAGNOSIS — J449 Chronic obstructive pulmonary disease, unspecified: Secondary | ICD-10-CM | POA: Diagnosis not present

## 2015-05-20 DIAGNOSIS — E785 Hyperlipidemia, unspecified: Secondary | ICD-10-CM | POA: Diagnosis not present

## 2015-05-22 DIAGNOSIS — I25119 Atherosclerotic heart disease of native coronary artery with unspecified angina pectoris: Secondary | ICD-10-CM | POA: Diagnosis not present

## 2015-05-22 DIAGNOSIS — E1151 Type 2 diabetes mellitus with diabetic peripheral angiopathy without gangrene: Secondary | ICD-10-CM | POA: Diagnosis not present

## 2015-05-22 DIAGNOSIS — E785 Hyperlipidemia, unspecified: Secondary | ICD-10-CM | POA: Diagnosis not present

## 2015-05-22 DIAGNOSIS — J449 Chronic obstructive pulmonary disease, unspecified: Secondary | ICD-10-CM | POA: Diagnosis not present

## 2015-06-05 DIAGNOSIS — R32 Unspecified urinary incontinence: Secondary | ICD-10-CM | POA: Diagnosis not present

## 2015-06-06 DIAGNOSIS — J9690 Respiratory failure, unspecified, unspecified whether with hypoxia or hypercapnia: Secondary | ICD-10-CM | POA: Diagnosis not present

## 2015-06-11 ENCOUNTER — Other Ambulatory Visit (HOSPITAL_COMMUNITY): Payer: Self-pay | Admitting: Pulmonary Disease

## 2015-06-11 DIAGNOSIS — Z1231 Encounter for screening mammogram for malignant neoplasm of breast: Secondary | ICD-10-CM

## 2015-07-06 DIAGNOSIS — R32 Unspecified urinary incontinence: Secondary | ICD-10-CM | POA: Diagnosis not present

## 2015-07-07 DIAGNOSIS — J9690 Respiratory failure, unspecified, unspecified whether with hypoxia or hypercapnia: Secondary | ICD-10-CM | POA: Diagnosis not present

## 2015-07-09 ENCOUNTER — Ambulatory Visit (HOSPITAL_COMMUNITY)
Admission: RE | Admit: 2015-07-09 | Discharge: 2015-07-09 | Disposition: A | Discharge status: Home / Self Care | Payer: Medicare Other | Source: Ambulatory Visit | Attending: Pulmonary Disease | Admitting: Pulmonary Disease

## 2015-07-09 DIAGNOSIS — Z1231 Encounter for screening mammogram for malignant neoplasm of breast: Secondary | ICD-10-CM | POA: Diagnosis not present

## 2015-08-04 DIAGNOSIS — J9690 Respiratory failure, unspecified, unspecified whether with hypoxia or hypercapnia: Secondary | ICD-10-CM | POA: Diagnosis not present

## 2015-08-05 ENCOUNTER — Other Ambulatory Visit (HOSPITAL_COMMUNITY): Payer: Self-pay | Admitting: Pulmonary Disease

## 2015-08-05 ENCOUNTER — Ambulatory Visit (HOSPITAL_COMMUNITY)
Admission: RE | Admit: 2015-08-05 | Discharge: 2015-08-05 | Disposition: A | Discharge status: Home / Self Care | Payer: Medicare Other | Source: Ambulatory Visit | Attending: Pulmonary Disease | Admitting: Pulmonary Disease

## 2015-08-05 DIAGNOSIS — J441 Chronic obstructive pulmonary disease with (acute) exacerbation: Secondary | ICD-10-CM | POA: Diagnosis not present

## 2015-08-05 DIAGNOSIS — J42 Unspecified chronic bronchitis: Secondary | ICD-10-CM

## 2015-08-05 DIAGNOSIS — J9811 Atelectasis: Secondary | ICD-10-CM | POA: Insufficient documentation

## 2015-08-05 DIAGNOSIS — E119 Type 2 diabetes mellitus without complications: Secondary | ICD-10-CM | POA: Diagnosis not present

## 2015-08-05 DIAGNOSIS — I10 Essential (primary) hypertension: Secondary | ICD-10-CM | POA: Diagnosis not present

## 2015-08-05 DIAGNOSIS — I119 Hypertensive heart disease without heart failure: Secondary | ICD-10-CM | POA: Diagnosis not present

## 2015-09-04 DIAGNOSIS — J9690 Respiratory failure, unspecified, unspecified whether with hypoxia or hypercapnia: Secondary | ICD-10-CM | POA: Diagnosis not present

## 2015-09-21 DIAGNOSIS — J449 Chronic obstructive pulmonary disease, unspecified: Secondary | ICD-10-CM | POA: Diagnosis not present

## 2015-10-04 DIAGNOSIS — J9690 Respiratory failure, unspecified, unspecified whether with hypoxia or hypercapnia: Secondary | ICD-10-CM | POA: Diagnosis not present

## 2015-11-04 DIAGNOSIS — J9690 Respiratory failure, unspecified, unspecified whether with hypoxia or hypercapnia: Secondary | ICD-10-CM | POA: Diagnosis not present

## 2015-11-11 ENCOUNTER — Other Ambulatory Visit (HOSPITAL_COMMUNITY): Payer: Self-pay | Admitting: Pulmonary Disease

## 2015-11-11 DIAGNOSIS — Z0000 Encounter for general adult medical examination without abnormal findings: Secondary | ICD-10-CM | POA: Diagnosis not present

## 2015-11-11 DIAGNOSIS — Z780 Asymptomatic menopausal state: Secondary | ICD-10-CM

## 2015-11-19 ENCOUNTER — Other Ambulatory Visit (HOSPITAL_COMMUNITY): Payer: Self-pay

## 2015-11-19 ENCOUNTER — Other Ambulatory Visit (HOSPITAL_COMMUNITY)
Admission: RE | Admit: 2015-11-19 | Discharge: 2015-11-19 | Disposition: A | Discharge status: Home / Self Care | Payer: Medicare Other | Source: Ambulatory Visit | Attending: Pulmonary Disease | Admitting: Pulmonary Disease

## 2015-11-19 DIAGNOSIS — E1165 Type 2 diabetes mellitus with hyperglycemia: Secondary | ICD-10-CM | POA: Diagnosis not present

## 2015-11-20 LAB — HEMOGLOBIN A1C
Hgb A1c MFr Bld: 5.8 % — ABNORMAL HIGH (ref 4.8–5.6)
Mean Plasma Glucose: 120 mg/dL

## 2015-12-04 DIAGNOSIS — J9690 Respiratory failure, unspecified, unspecified whether with hypoxia or hypercapnia: Secondary | ICD-10-CM | POA: Diagnosis not present

## 2016-01-04 DIAGNOSIS — J9690 Respiratory failure, unspecified, unspecified whether with hypoxia or hypercapnia: Secondary | ICD-10-CM | POA: Diagnosis not present

## 2016-02-04 DIAGNOSIS — J9690 Respiratory failure, unspecified, unspecified whether with hypoxia or hypercapnia: Secondary | ICD-10-CM | POA: Diagnosis not present

## 2016-02-13 ENCOUNTER — Encounter (HOSPITAL_COMMUNITY): Payer: Self-pay | Admitting: Emergency Medicine

## 2016-02-13 ENCOUNTER — Emergency Department (HOSPITAL_COMMUNITY): Payer: Medicare Other

## 2016-02-13 ENCOUNTER — Emergency Department (HOSPITAL_COMMUNITY)
Admission: EM | Admit: 2016-02-13 | Discharge: 2016-02-13 | Disposition: A | Discharge status: Home / Self Care | Payer: Medicare Other | Attending: Emergency Medicine | Admitting: Emergency Medicine

## 2016-02-13 DIAGNOSIS — Z87891 Personal history of nicotine dependence: Secondary | ICD-10-CM | POA: Diagnosis not present

## 2016-02-13 DIAGNOSIS — Z7982 Long term (current) use of aspirin: Secondary | ICD-10-CM | POA: Insufficient documentation

## 2016-02-13 DIAGNOSIS — R05 Cough: Secondary | ICD-10-CM | POA: Insufficient documentation

## 2016-02-13 DIAGNOSIS — I110 Hypertensive heart disease with heart failure: Secondary | ICD-10-CM | POA: Insufficient documentation

## 2016-02-13 DIAGNOSIS — J440 Chronic obstructive pulmonary disease with acute lower respiratory infection: Secondary | ICD-10-CM

## 2016-02-13 DIAGNOSIS — I2510 Atherosclerotic heart disease of native coronary artery without angina pectoris: Secondary | ICD-10-CM | POA: Insufficient documentation

## 2016-02-13 DIAGNOSIS — J45909 Unspecified asthma, uncomplicated: Secondary | ICD-10-CM | POA: Diagnosis not present

## 2016-02-13 DIAGNOSIS — I5022 Chronic systolic (congestive) heart failure: Secondary | ICD-10-CM | POA: Diagnosis not present

## 2016-02-13 DIAGNOSIS — Z7984 Long term (current) use of oral hypoglycemic drugs: Secondary | ICD-10-CM | POA: Insufficient documentation

## 2016-02-13 DIAGNOSIS — Z79899 Other long term (current) drug therapy: Secondary | ICD-10-CM | POA: Insufficient documentation

## 2016-02-13 DIAGNOSIS — J209 Acute bronchitis, unspecified: Secondary | ICD-10-CM

## 2016-02-13 DIAGNOSIS — E119 Type 2 diabetes mellitus without complications: Secondary | ICD-10-CM | POA: Diagnosis not present

## 2016-02-13 DIAGNOSIS — J449 Chronic obstructive pulmonary disease, unspecified: Secondary | ICD-10-CM | POA: Insufficient documentation

## 2016-02-13 LAB — BRAIN NATRIURETIC PEPTIDE: B Natriuretic Peptide: 43 pg/mL (ref 0.0–100.0)

## 2016-02-13 LAB — COMPREHENSIVE METABOLIC PANEL
ALT: 12 U/L — ABNORMAL LOW (ref 14–54)
AST: 13 U/L — ABNORMAL LOW (ref 15–41)
Albumin: 3.8 g/dL (ref 3.5–5.0)
Alkaline Phosphatase: 72 U/L (ref 38–126)
Anion gap: 7 (ref 5–15)
BUN: 11 mg/dL (ref 6–20)
CO2: 28 mmol/L (ref 22–32)
Calcium: 9.1 mg/dL (ref 8.9–10.3)
Chloride: 105 mmol/L (ref 101–111)
Creatinine, Ser: 0.88 mg/dL (ref 0.44–1.00)
GFR calc Af Amer: >60 mL/min (ref >60)
GFR calc non Af Amer: >60 mL/min (ref >60)
Glucose, Bld: 99 mg/dL (ref 65–99)
Potassium: 4.2 mmol/L (ref 3.5–5.1)
Sodium: 140 mmol/L (ref 135–145)
Total Bilirubin: 0.6 mg/dL (ref 0.3–1.2)
Total Protein: 6.9 g/dL (ref 6.5–8.1)

## 2016-02-13 LAB — CBC WITH DIFFERENTIAL/PLATELET
Basophils Absolute: 0 10*3/uL (ref 0.0–0.1)
Basophils Relative: 0 %
Eosinophils Absolute: 0.1 10*3/uL (ref 0.0–0.7)
Eosinophils Relative: 2 %
HCT: 41.2 % (ref 36.0–46.0)
Hemoglobin: 13.6 g/dL (ref 12.0–15.0)
Lymphocytes Relative: 34 %
Lymphs Abs: 2.5 10*3/uL (ref 0.7–4.0)
MCH: 28.5 pg (ref 26.0–34.0)
MCHC: 33 g/dL (ref 30.0–36.0)
MCV: 86.2 fL (ref 78.0–100.0)
Monocytes Absolute: 0.4 10*3/uL (ref 0.1–1.0)
Monocytes Relative: 5 %
Neutro Abs: 4.3 10*3/uL (ref 1.7–7.7)
Neutrophils Relative %: 59 %
Platelets: 276 10*3/uL (ref 150–400)
RBC: 4.78 MIL/uL (ref 3.87–5.11)
RDW: 14.5 % (ref 11.5–15.5)
WBC: 7.3 10*3/uL (ref 4.0–10.5)

## 2016-02-13 LAB — CBG MONITORING, ED: Glucose-Capillary: 97 mg/dL (ref 65–99)

## 2016-02-13 MED ORDER — PREDNISONE 20 MG PO TABS: ORAL_TABLET | ORAL | 0 refills | Status: DC

## 2016-02-13 MED ORDER — IPRATROPIUM-ALBUTEROL 0.5-2.5 (3) MG/3ML IN SOLN
3.0000 mL | Freq: Once | RESPIRATORY_TRACT | Status: AC
  Administered 2016-02-13: 3 mL via RESPIRATORY_TRACT
  Filled 2016-02-13: qty 3

## 2016-02-13 MED ORDER — ALBUTEROL SULFATE (2.5 MG/3ML) 0.083% IN NEBU
2.5000 mg | INHALATION_SOLUTION | Freq: Once | RESPIRATORY_TRACT | Status: AC
  Administered 2016-02-13: 2.5 mg via RESPIRATORY_TRACT
  Filled 2016-02-13: qty 3

## 2016-02-13 MED ORDER — PREDNISONE 10 MG PO TABS
60.0000 mg | ORAL_TABLET | Freq: Once | ORAL | Status: AC
  Administered 2016-02-13: 60 mg via ORAL
  Filled 2016-02-13: qty 1

## 2016-02-13 NOTE — Discharge Instructions (Signed)
Follow-up with your doctor this week for recheck. Return if not improving
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
## 2016-02-13 NOTE — ED Triage Notes (Signed)
Patient c/o productive cough with "sneezing, wheezing, and shortness of breath." Patient reports using inhalers, neb treatments, and 2 liters of oxygen. Per patient symptoms x1 week. Per patient out of her medications (inhaler, neb treatments). Denies any fevers. Per patient thick white sputum.

## 2016-03-01 IMAGING — CR DG FOOT COMPLETE 3+V*L*
3 series · 3 of 3 positions shown · non-contrast
Comparison: None.

CLINICAL DATA: Diabetic ulcer left foot.

EXAM:
LEFT FOOT - COMPLETE 3+ VIEW

[t foot ap left]
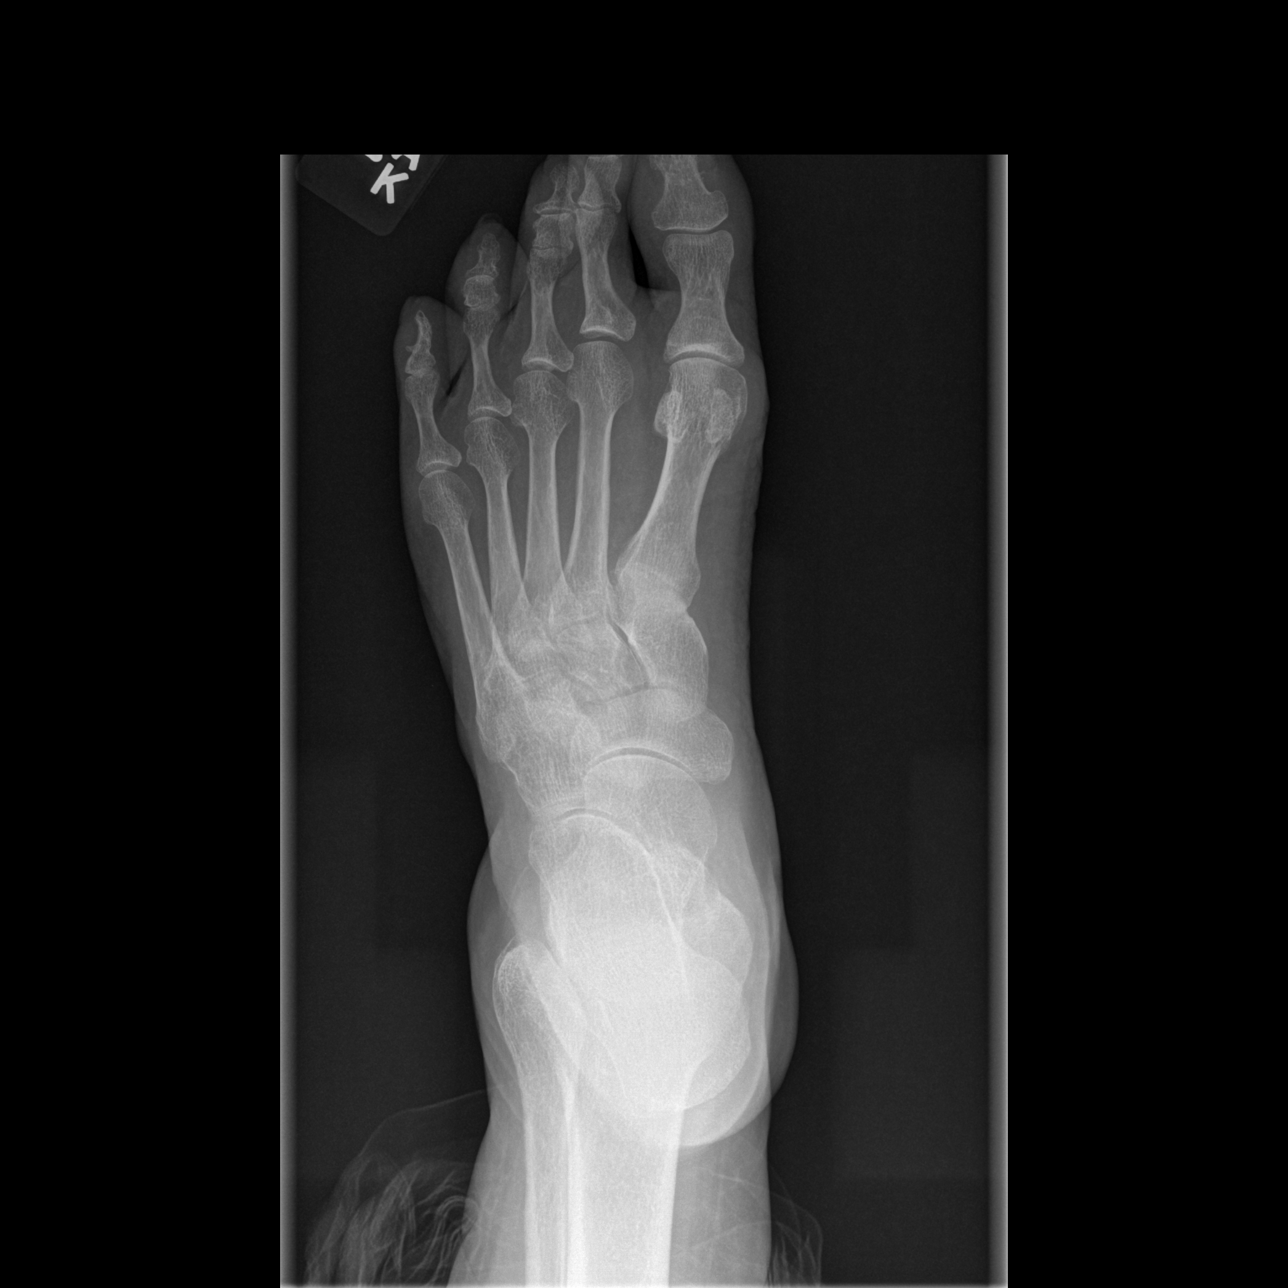

[t foot oblique left]
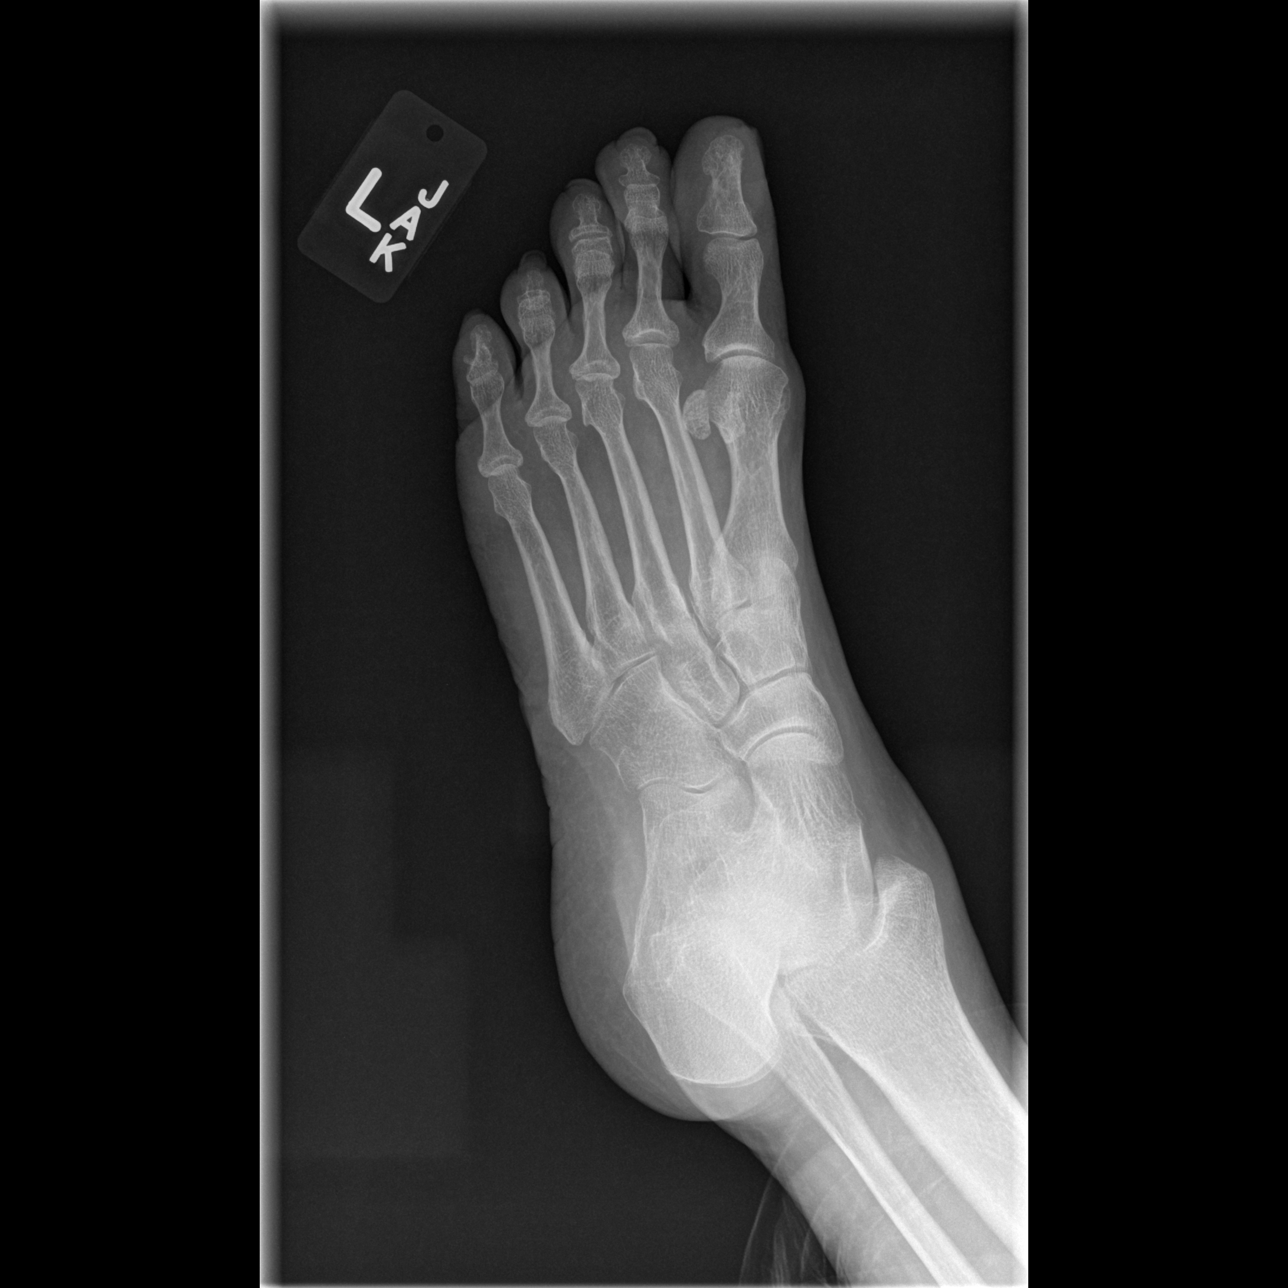

[t foot lat left]
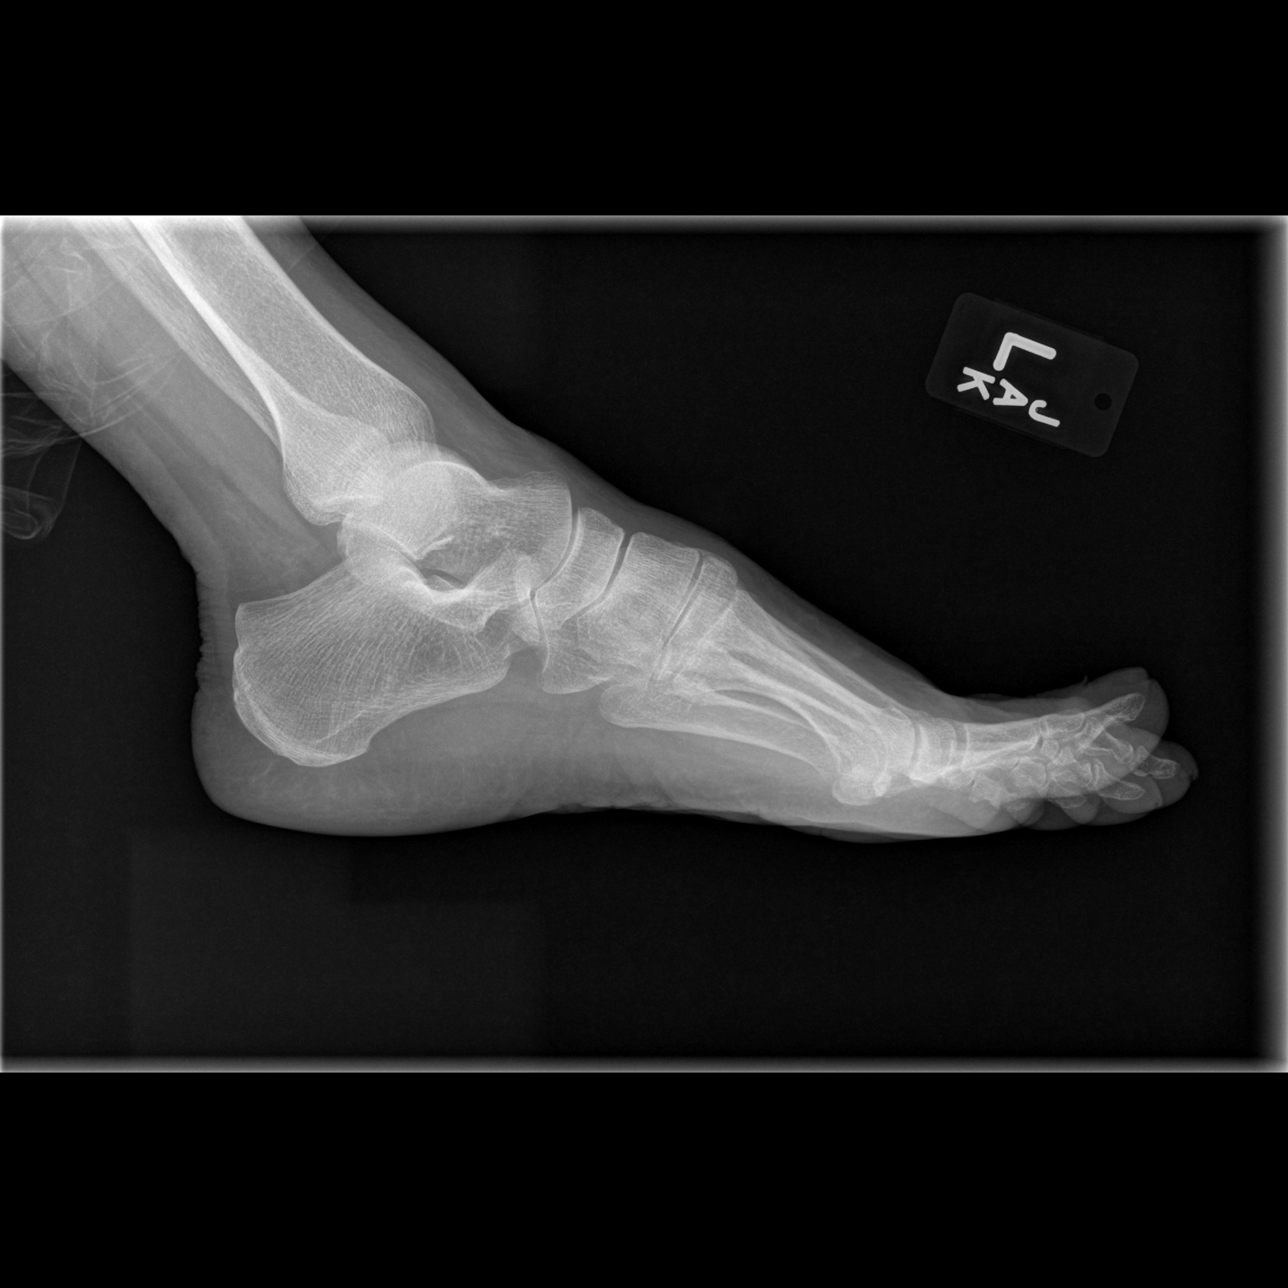

[3 of 3 positions shown; findings below may reference images not displayed]

FINDINGS: There is no evidence of fracture or dislocation. There is no
evidence of arthropathy or other focal bone abnormality. Soft
tissues are unremarkable.
IMPRESSION: Negative.

## 2016-05-04 ENCOUNTER — Other Ambulatory Visit: Payer: Self-pay | Admitting: Pharmacist

## 2016-05-04 NOTE — Patient Outreach (Signed)
Outreach call to [employer] regarding medication adherence to rosuvastatin. Unable to reach patient.  Harlow Asa, PharmD, [facility] [phone]

## 2016-05-12 DIAGNOSIS — M545 Low back pain: Secondary | ICD-10-CM | POA: Diagnosis not present

## 2016-05-12 DIAGNOSIS — J449 Chronic obstructive pulmonary disease, unspecified: Secondary | ICD-10-CM | POA: Diagnosis not present

## 2016-05-12 DIAGNOSIS — I25119 Atherosclerotic heart disease of native coronary artery with unspecified angina pectoris: Secondary | ICD-10-CM | POA: Diagnosis not present

## 2016-05-30 DIAGNOSIS — J449 Chronic obstructive pulmonary disease, unspecified: Secondary | ICD-10-CM | POA: Diagnosis not present

## 2016-06-05 DIAGNOSIS — J9690 Respiratory failure, unspecified, unspecified whether with hypoxia or hypercapnia: Secondary | ICD-10-CM | POA: Diagnosis not present

## 2016-06-09 ENCOUNTER — Ambulatory Visit (INDEPENDENT_AMBULATORY_CARE_PROVIDER_SITE_OTHER): Payer: Self-pay | Admitting: Otolaryngology

## 2016-06-30 ENCOUNTER — Ambulatory Visit (INDEPENDENT_AMBULATORY_CARE_PROVIDER_SITE_OTHER): Payer: Medicare Other | Admitting: Otolaryngology

## 2016-06-30 DIAGNOSIS — H903 Sensorineural hearing loss, bilateral: Secondary | ICD-10-CM | POA: Diagnosis not present

## 2016-06-30 DIAGNOSIS — H9121 Sudden idiopathic hearing loss, right ear: Secondary | ICD-10-CM

## 2016-07-04 ENCOUNTER — Other Ambulatory Visit (INDEPENDENT_AMBULATORY_CARE_PROVIDER_SITE_OTHER): Payer: Self-pay | Admitting: Otolaryngology

## 2016-07-04 DIAGNOSIS — H918X9 Other specified hearing loss, unspecified ear: Secondary | ICD-10-CM

## 2016-07-06 DIAGNOSIS — J9690 Respiratory failure, unspecified, unspecified whether with hypoxia or hypercapnia: Secondary | ICD-10-CM | POA: Diagnosis not present

## 2016-07-20 ENCOUNTER — Other Ambulatory Visit (HOSPITAL_COMMUNITY): Payer: Self-pay | Admitting: Pulmonary Disease

## 2016-07-20 DIAGNOSIS — Z1231 Encounter for screening mammogram for malignant neoplasm of breast: Secondary | ICD-10-CM

## 2016-07-28 ENCOUNTER — Ambulatory Visit (INDEPENDENT_AMBULATORY_CARE_PROVIDER_SITE_OTHER): Payer: Self-pay | Admitting: Otolaryngology

## 2016-08-02 ENCOUNTER — Ambulatory Visit (HOSPITAL_COMMUNITY): Payer: Medicare Other

## 2016-08-03 ENCOUNTER — Ambulatory Visit (HOSPITAL_COMMUNITY)
Admission: RE | Admit: 2016-08-03 | Discharge: 2016-08-03 | Disposition: A | Discharge status: Home / Self Care | Payer: Medicare Other | Source: Ambulatory Visit | Attending: Pulmonary Disease | Admitting: Pulmonary Disease

## 2016-08-03 DIAGNOSIS — Z1231 Encounter for screening mammogram for malignant neoplasm of breast: Secondary | ICD-10-CM | POA: Diagnosis not present

## 2016-08-03 DIAGNOSIS — J9690 Respiratory failure, unspecified, unspecified whether with hypoxia or hypercapnia: Secondary | ICD-10-CM | POA: Diagnosis not present

## 2016-08-19 ENCOUNTER — Ambulatory Visit (INDEPENDENT_AMBULATORY_CARE_PROVIDER_SITE_OTHER): Payer: Medicare Other | Admitting: Family Medicine

## 2016-08-19 ENCOUNTER — Encounter: Payer: Self-pay | Admitting: Family Medicine

## 2016-08-19 VITALS — BP 136/88 | HR 104 | Temp 96.7°F | Resp 20 | Ht 61.0 in | Wt 136.1 lb

## 2016-08-19 DIAGNOSIS — F411 Generalized anxiety disorder: Secondary | ICD-10-CM

## 2016-08-19 DIAGNOSIS — Z7689 Persons encountering health services in other specified circumstances: Secondary | ICD-10-CM | POA: Diagnosis not present

## 2016-08-19 DIAGNOSIS — J440 Chronic obstructive pulmonary disease with acute lower respiratory infection: Secondary | ICD-10-CM | POA: Diagnosis not present

## 2016-08-19 DIAGNOSIS — J209 Acute bronchitis, unspecified: Secondary | ICD-10-CM

## 2016-08-19 DIAGNOSIS — Z720 Tobacco use: Secondary | ICD-10-CM

## 2016-08-19 DIAGNOSIS — I2510 Atherosclerotic heart disease of native coronary artery without angina pectoris: Secondary | ICD-10-CM | POA: Diagnosis not present

## 2016-08-19 DIAGNOSIS — E1159 Type 2 diabetes mellitus with other circulatory complications: Secondary | ICD-10-CM

## 2016-08-19 MED ORDER — DULOXETINE HCL 20 MG PO CPEP: 20.0000 mg | ORAL_CAPSULE | Freq: Every day | ORAL | 3 refills | Status: DC

## 2016-08-19 NOTE — Progress Notes (Addendum)
Chief Complaint  Patient presents with  . Establish Care   New to establish Previous care Dr Luan Pulling Stable heart disease and COPD Continues to smoke and the urgency to quit is discussed.  She is using nicotrol inhalers to substitute occasionally - Is advised strongly to QUIT Has anxiety currently untreated,  She has not liked the medicine used previously, except benzodiazepines which I do not recommend.  She agrees to try duloxetine as she has a chronic pain component to her symptoms. We discussed that I do not provide chronic narcotic prescriptions for pain.  She will need to take alternate medicines and therapy or consult a pain provider. She states her diabetes is well controlled.  Last HgbA1c was 5.8 Lipids controlled with low LDL, under 70 No regular exercise due to chronic pain and dyspnea.  Is advised to walk daily to tolerance Sees GYN for PAPs, is due Mammogram this year Overdue for dexa scan Colon cancer screen  Due, sees [facility] doc Eye exam up to date   Patient Active Problem List   Diagnosis Date Noted  . Onychomycosis 05/28/2014  . Diabetic foot ulcer ([place]) 04/30/2014  . Rectocele 10/09/2012  . Diabetes ([place]) 10/05/2012  . CAD (coronary artery disease) 02/21/2012  . Systolic CHF, chronic ([place]) 02/21/2012  . Chronic back pain 02/20/2012  . Hypercapnia 07/30/2011  . HTN (hypertension) 07/11/2011  . Tobacco abuse 07/11/2011  . Anxiety disorder 07/11/2011  . Thyroid enlargement 06/28/2011  . Ischemic cardiomyopathy 06/28/2011  . DEPRESSION 02/04/2010  . Other chronic pain 02/04/2010  . Acute bronchitis with COPD ([place]) 02/04/2010    Outpatient Encounter Prescriptions as of 08/19/2016  Medication Sig  . albuterol (PROVENTIL HFA;VENTOLIN HFA) 108 (90 BASE) MCG/ACT inhaler Inhale 2 puffs into the lungs every 6 (six) hours as needed for wheezing or shortness of breath.  Marland Kitchen albuterol (PROVENTIL) (2.5 MG/3ML) 0.083% nebulizer solution Take 2.5 mg by nebulization 4  (four) times daily.   Marland Kitchen aspirin EC 81 MG tablet Take 81 mg by mouth daily.  . cyclobenzaprine (FLEXERIL) 10 MG tablet Take 10 mg by mouth 2 (two) times daily.  . furosemide (LASIX) 20 MG tablet Take 1 tablet (20 mg total) by mouth daily.  Marland Kitchen HYDROcodone-acetaminophen (NORCO/VICODIN) 5-325 MG per tablet Take 1 tablet by mouth 2 (two) times daily.  . Lactobacillus (ACIDOPHILUS PROBIOTIC) 100 MG CAPS Take 1 capsule by mouth daily.  Marland Kitchen levothyroxine (SYNTHROID, LEVOTHROID) 25 MCG tablet Take 25 mcg by mouth daily.  . metFORMIN (GLUCOPHAGE) 500 MG tablet Take 500 mg by mouth 2 (two) times daily with a meal.   . pantoprazole (PROTONIX) 40 MG tablet Take 40 mg by mouth daily.  . polyethylene glycol powder (GLYCOLAX/MIRALAX) powder Take 17 g by mouth daily. (Patient taking differently: Take 17 g by mouth daily as needed for mild constipation or moderate constipation. )  . rosuvastatin (CRESTOR) 10 MG tablet Take 10 mg by mouth daily at 6 PM.  . tiotropium (SPIRIVA) 18 MCG inhalation capsule Place 18 mcg into inhaler and inhale daily.  . Vitamin D, Ergocalciferol, (DRISDOL) 50000 UNITS CAPS capsule Take 50,000 Units by mouth every 7 (seven) days.  . DULoxetine (CYMBALTA) 20 MG capsule Take 1 capsule (20 mg total) by mouth daily.   No facility-administered encounter medications on file as of 08/19/2016.     Past Medical History:  Diagnosis Date  . Acute respiratory failure ([place]) 06/2011   Vent dependent sec to COPD/PNA  . Anxiety   . Arthritis   .  Back pain   . Bronchitis   . CHF (congestive heart failure) ([place])   . COPD (chronic obstructive pulmonary disease) ([place])   . Coronary artery disease   . Demand ischemia of myocardium 06/28/2011  . Depression   . Diabetes mellitus without complication ([place])   . Hyperlipidemia   . Hypertension   . Ischemic cardiomyopathy 06/28/2011   EF 25%. Myoview stress test later showed ejection fraction of 65%  . MI, acute, non ST segment elevation ([place]) 06/29/2011    Myoview stress test revealed mild perfusion defect  . Myocardial infarction   . Oxygen deficiency   . PNA (pneumonia) 06/29/2011  . Pneumonia   . Rectocele 10/09/2012  . Substance abuse    MANY YEARS AGO  . Thyroid enlargement 06/28/2011    Past Surgical History:  Procedure Laterality Date  . COLONOSCOPY  Feb 2013   Dr. Michail Sermon: internal hemorrhoids, normal view of ileum, multiple hyperplastic polyps  . ESOPHAGOGASTRODUODENOSCOPY  Feb 2013   Dr. Michail Sermon: normal duodenum, candida esophagitis, reactive gastropathy  . TUBAL LIGATION      Social History   Social History  . Marital status: Single    Spouse name: N/A  . Number of children: N/A  . Years of education: N/A   Occupational History  . Not on file.   Social History Main Topics  . Smoking status: Current Every Day Smoker    Packs/day: 0.50    Years: 49.00    Types: Cigarettes    Start date: 05/10/1967  . Smokeless tobacco: Never Used     Comment: START 1969  . Alcohol use No  . Drug use: No  . Sexual activity: No   Other Topics Concern  . Not on file   Social History Narrative  . No narrative on file    Family History  Problem Relation Age of Onset  . Diabetes Mother   . Hypertension Mother   . Diabetes Father   . Hypertension Father   . Heart disease Father     HEART FAILURE  . Cancer Sister     FEMALE  . Early death Sister     PNEUMONIA  . Early death Brother     mva  . Early death Son 35    HEART ATTACK  . Drug abuse Son   . Cancer Maternal Uncle   . Colon cancer Neg Hx     Review of Systems  Constitutional: Negative for chills, fever and weight loss.  HENT: Negative for congestion and hearing loss.   Eyes: Negative for blurred vision and pain.  Respiratory: Positive for cough, sputum production and shortness of breath.   Cardiovascular: Negative for chest pain and leg swelling.  Gastrointestinal: Negative for abdominal pain, constipation, diarrhea and heartburn.  Genitourinary: Negative  for dysuria and frequency.  Musculoskeletal: Negative for falls, joint pain and myalgias.  Neurological: Negative for dizziness, seizures and headaches.  Endo/Heme/Allergies: Positive for environmental allergies. Negative for polydipsia.  Psychiatric/Behavioral: Negative for depression. The patient is nervous/anxious. The patient does not have insomnia.     BP 136/88 (BP Location: Right Arm, Patient Position: Sitting, Cuff Size: Normal)   Pulse (!) 104   Temp (!) 96.7 F (35.9 C) (Temporal)   Resp 20   Ht 5\' 1"  (1.549 m)   Wt 136 lb 1.3 oz (61.7 kg)   SpO2 98%   BMI 25.71 kg/m   Physical Exam  Constitutional: She is oriented to person, place, and time. She appears well-developed and well-nourished.  No distress.  HENT:  Head: Normocephalic and atraumatic.  Mouth/Throat: Oropharynx is clear and moist.  Eyes: Conjunctivae are normal. Pupils are equal, round, and reactive to light.  Neck: Normal range of motion. No thyromegaly present.  Cardiovascular: Normal rate and regular rhythm.   Pulmonary/Chest: Effort normal.  Decreased BS  Musculoskeletal:  No clubbing, cyanosis, or edema  Neurological: She is alert and oriented to person, place, and time.  Psychiatric: She has a normal mood and affect. Her behavior is normal.    1. Coronary artery disease involving native coronary artery of native heart without angina pectoris Under care Dr Luan Pulling  2. Acute bronchitis with COPD ([place]) Under care Dr Luan Pulling  3. Type 2 diabetes mellitus with other circulatory complication, without long-term current use of insulin (HCC) Well controlled - CBC - Comprehensive metabolic panel - Hemoglobin A1c - Lipid panel - VITAMIN D 25 Hydroxy (Vit-D Deficiency, Fractures) - Microalbumin / creatinine urine ratio - Urinalysis, Routine w reflex microscopic - TSH  4. GAD (generalized anxiety disorder) Rx duloxetine with instructions  5. Tobacco abuse QUIT   Patient Instructions  NEED RECORD  OF EYE EXAM AND COLONOSCOPY - prior I do not do chronic narcotic pain management  Due for eye exam Due for colonoscopy Due for PAP smear  Walk every day that you are able  You are due for blood testing  Try to STOP SMOKING  Take the duloxetine every day Call if there are side effects  See me in one month for follow up - 40 min physical Bring medicine with you next time ( inhalers especially)       Raylene Everts, MD

## 2016-08-19 NOTE — Patient Instructions (Addendum)
NEED RECORD OF EYE EXAM AND COLONOSCOPY - prior I do not do chronic narcotic pain management  Due for eye exam Due for colonoscopy Due for PAP smear  Walk every day that you are able  You are due for blood testing  Try to STOP SMOKING  Take the duloxetine every day Call if there are side effects  See me in one month for follow up - 40 min physical Bring medicine with you next time ( inhalers especially)

## 2016-08-20 LAB — CBC
HCT: 44.3 % (ref 35.0–45.0)
Hemoglobin: 14.6 g/dL (ref 11.7–15.5)
MCH: 29 pg (ref 27.0–33.0)
MCHC: 33 g/dL (ref 32.0–36.0)
MCV: 87.9 fL (ref 80.0–100.0)
MPV: 9.4 fL (ref 7.5–12.5)
Platelets: 267 10*3/uL (ref 140–400)
RBC: 5.04 MIL/uL (ref 3.80–5.10)
RDW: 14.8 % (ref 11.0–15.0)
WBC: 8 10*3/uL (ref 3.8–10.8)

## 2016-08-20 LAB — MICROALBUMIN / CREATININE URINE RATIO
Creatinine, Urine: 62 mg/dL (ref 20–320)
Microalb Creat Ratio: 16 mcg/mg creat (ref <30)
Microalb, Ur: 1 mg/dL

## 2016-08-20 LAB — LIPID PANEL
Cholesterol: 123 mg/dL (ref <200)
HDL: 50 mg/dL — ABNORMAL LOW (ref >50)
LDL Cholesterol: 51 mg/dL (ref <100)
Total CHOL/HDL Ratio: 2.5 Ratio (ref <5.0)
Triglycerides: 109 mg/dL (ref <150)
VLDL: 22 mg/dL (ref <30)

## 2016-08-20 LAB — VITAMIN D 25 HYDROXY (VIT D DEFICIENCY, FRACTURES): Vit D, 25-Hydroxy: 25 ng/mL — ABNORMAL LOW (ref 30–100)

## 2016-08-20 LAB — COMPREHENSIVE METABOLIC PANEL
ALT: 13 U/L (ref 6–29)
AST: 14 U/L (ref 10–35)
Albumin: 3.9 g/dL (ref 3.6–5.1)
Alkaline Phosphatase: 80 U/L (ref 33–130)
BUN: 8 mg/dL (ref 7–25)
CO2: 30 mmol/L (ref 20–31)
Calcium: 9.3 mg/dL (ref 8.6–10.4)
Chloride: 101 mmol/L (ref 98–110)
Creat: 0.87 mg/dL (ref 0.50–0.99)
Glucose, Bld: 80 mg/dL (ref 65–99)
Potassium: 3.4 mmol/L — ABNORMAL LOW (ref 3.5–5.3)
Sodium: 142 mmol/L (ref 135–146)
Total Bilirubin: 0.4 mg/dL (ref 0.2–1.2)
Total Protein: 6.2 g/dL (ref 6.1–8.1)

## 2016-08-20 LAB — URINALYSIS, MICROSCOPIC ONLY
Bacteria, UA: NONE SEEN [HPF]
Casts: NONE SEEN [LPF]
Crystals: NONE SEEN [HPF]
RBC / HPF: NONE SEEN RBC/HPF (ref <=2)
WBC, UA: NONE SEEN WBC/HPF (ref <=5)
Yeast: NONE SEEN [HPF]

## 2016-08-20 LAB — URINALYSIS, ROUTINE W REFLEX MICROSCOPIC
Bilirubin Urine: NEGATIVE
Glucose, UA: NEGATIVE
Ketones, ur: NEGATIVE
Leukocytes, UA: NEGATIVE
Nitrite: NEGATIVE
Protein, ur: NEGATIVE
Specific Gravity, Urine: 1.01 (ref 1.001–1.035)
pH: 5.5 (ref 5.0–8.0)

## 2016-08-20 LAB — HEMOGLOBIN A1C
Hgb A1c MFr Bld: 5.8 % — ABNORMAL HIGH (ref <5.7)
Mean Plasma Glucose: 120 mg/dL

## 2016-08-20 LAB — TSH: TSH: 2.83 mIU/L

## 2016-08-22 ENCOUNTER — Encounter: Payer: Self-pay | Admitting: Family Medicine

## 2016-08-22 MED ORDER — POTASSIUM CHLORIDE ER 10 MEQ PO TBCR: 10.0000 meq | EXTENDED_RELEASE_TABLET | Freq: Every day | ORAL | 3 refills | Status: DC

## 2016-08-29 DIAGNOSIS — I10 Essential (primary) hypertension: Secondary | ICD-10-CM | POA: Diagnosis not present

## 2016-08-29 DIAGNOSIS — J449 Chronic obstructive pulmonary disease, unspecified: Secondary | ICD-10-CM | POA: Diagnosis not present

## 2016-08-31 DIAGNOSIS — J449 Chronic obstructive pulmonary disease, unspecified: Secondary | ICD-10-CM | POA: Diagnosis not present

## 2016-08-31 DIAGNOSIS — M545 Low back pain: Secondary | ICD-10-CM | POA: Diagnosis not present

## 2016-09-03 DIAGNOSIS — J9690 Respiratory failure, unspecified, unspecified whether with hypoxia or hypercapnia: Secondary | ICD-10-CM | POA: Diagnosis not present

## 2016-09-19 ENCOUNTER — Ambulatory Visit (INDEPENDENT_AMBULATORY_CARE_PROVIDER_SITE_OTHER): Payer: Medicare Other | Admitting: Family Medicine

## 2016-09-19 ENCOUNTER — Ambulatory Visit: Payer: Self-pay

## 2016-09-19 ENCOUNTER — Encounter: Payer: Self-pay | Admitting: Family Medicine

## 2016-09-19 VITALS — BP 126/78 | HR 92 | Temp 97.0°F | Resp 20 | Ht 61.0 in | Wt 137.0 lb

## 2016-09-19 DIAGNOSIS — Z1211 Encounter for screening for malignant neoplasm of colon: Secondary | ICD-10-CM

## 2016-09-19 DIAGNOSIS — Z780 Asymptomatic menopausal state: Secondary | ICD-10-CM

## 2016-09-19 DIAGNOSIS — Z720 Tobacco use: Secondary | ICD-10-CM | POA: Diagnosis not present

## 2016-09-19 DIAGNOSIS — I2510 Atherosclerotic heart disease of native coronary artery without angina pectoris: Secondary | ICD-10-CM

## 2016-09-19 DIAGNOSIS — Z0000 Encounter for general adult medical examination without abnormal findings: Secondary | ICD-10-CM

## 2016-09-19 DIAGNOSIS — E1159 Type 2 diabetes mellitus with other circulatory complications: Secondary | ICD-10-CM

## 2016-09-19 DIAGNOSIS — J439 Emphysema, unspecified: Secondary | ICD-10-CM

## 2016-09-19 NOTE — Progress Notes (Signed)
Chief Complaint  Patient presents with  . Annual Exam    Here for PE Recent labs reviewed DM is well controlled BP is good LDL is at goal [facility] every day the weather allows Compliant with medication No complaints  Patient Active Problem List   Diagnosis Date Noted  . Onychomycosis 05/28/2014  . Diabetic foot ulcer ([place]) 04/30/2014  . Rectocele 10/09/2012  . Diabetes ([place]) 10/05/2012  . CAD (coronary artery disease) 02/21/2012  . Systolic CHF, chronic ([place]) 02/21/2012  . Chronic back pain 02/20/2012  . Hypercapnia 07/30/2011  . HTN (hypertension) 07/11/2011  . Tobacco abuse 07/11/2011  . Anxiety disorder 07/11/2011  . Thyroid enlargement 06/28/2011  . Ischemic cardiomyopathy 06/28/2011  . DEPRESSION 02/04/2010  . COPD with emphysema ([place]) 02/04/2010    Outpatient Encounter Prescriptions as of 09/19/2016  Medication Sig  . albuterol (PROVENTIL HFA;VENTOLIN HFA) 108 (90 BASE) MCG/ACT inhaler Inhale 2 puffs into the lungs every 6 (six) hours as needed for wheezing or shortness of breath.  Marland Kitchen albuterol (PROVENTIL) (2.5 MG/3ML) 0.083% nebulizer solution Take 2.5 mg by nebulization 4 (four) times daily.   Marland Kitchen aspirin EC 81 MG tablet Take 81 mg by mouth daily.  . cyclobenzaprine (FLEXERIL) 10 MG tablet Take 10 mg by mouth 2 (two) times daily.  . DULoxetine (CYMBALTA) 20 MG capsule Take 1 capsule (20 mg total) by mouth daily.  . fluticasone furoate-vilanterol (BREO ELLIPTA) 100-25 MCG/INH AEPB Inhale 1 puff into the lungs daily.  . furosemide (LASIX) 20 MG tablet Take 1 tablet (20 mg total) by mouth daily.  . Lactobacillus (ACIDOPHILUS PROBIOTIC) 100 MG CAPS Take 1 capsule by mouth daily.  Marland Kitchen levothyroxine (SYNTHROID, LEVOTHROID) 25 MCG tablet Take 25 mcg by mouth daily.  . metFORMIN (GLUCOPHAGE) 500 MG tablet Take 500 mg by mouth 2 (two) times daily with a meal.   . pantoprazole (PROTONIX) 40 MG tablet Take 40 mg by mouth daily.  . polyethylene glycol powder (GLYCOLAX/MIRALAX)  powder Take 17 g by mouth daily. (Patient taking differently: Take 17 g by mouth daily as needed for mild constipation or moderate constipation. )  . potassium chloride (K-DUR) 10 MEQ tablet Take 1 tablet (10 mEq total) by mouth daily.  . rosuvastatin (CRESTOR) 10 MG tablet Take 10 mg by mouth daily at 6 PM.  . Vitamin D, Ergocalciferol, (DRISDOL) 50000 UNITS CAPS capsule Take 50,000 Units by mouth every 7 (seven) days.   No facility-administered encounter medications on file as of 09/19/2016.     No Known Allergies  Review of Systems  Constitutional: Negative for activity change, appetite change and unexpected weight change.  HENT: Positive for hearing loss. Negative for congestion, dental problem, postnasal drip and rhinorrhea.        Bottom plate does not fit.  Cannot hear R ear  Eyes: Negative for redness and visual disturbance.       Overdue eye exam  Respiratory: Positive for cough and shortness of breath.        COPD/Smoker  Cardiovascular: Negative for chest pain, palpitations and leg swelling.  Gastrointestinal: Negative for abdominal pain, constipation and diarrhea.  Genitourinary: Negative for difficulty urinating, frequency and vaginal bleeding.  Musculoskeletal: Positive for back pain. Negative for arthralgias.       Chronic  Skin: Positive for color change.       "age spots to check"  Neurological: Negative for dizziness and headaches.  Psychiatric/Behavioral: Negative for dysphoric mood and sleep disturbance. The patient is nervous/anxious.  BP 126/78 (BP Location: Right Arm, Patient Position: Sitting, Cuff Size: Normal)   Pulse 92   Temp 97 F (36.1 C) (Temporal)   Resp 20   Ht 5\' 1"  (1.549 m)   Wt 137 lb (62.1 kg)   SpO2 97%   BMI 25.89 kg/m   Physical Exam   BP 126/78 (BP Location: Right Arm, Patient Position: Sitting, Cuff Size: Normal)   Pulse 92   Temp 97 F (36.1 C) (Temporal)   Resp 20   Ht 5\' 1"  (1.549 m)   Wt 137 lb (62.1 kg)   SpO2 97%    BMI 25.89 kg/m   General Appearance:    Alert, cooperative, no distress, appears older than stated age  Head:    Normocephalic, without obvious abnormality, atraumatic  Eyes:    PERRL, conjunctiva/corneas clear, EOM's intact  Ears:    Normal TM's and external ear canals, both ears  Nose:   Nares normal, septum midline, mucosa normal, no drainage    or sinus tenderness  Throat:   Lips, mucosa, and tongue normal; gums normal  Neck:   Supple, symmetrical, trachea midline, no adenopathy;    thyroid:  no enlargement/tenderness/nodules; no carotid   bruit   Back:     Symmetric, no curvature, ROM normal, no CVA tenderness  Lungs:     Diminished BS, scattered inspiratory wheeze throughout  Chest Wall:    No tenderness or deformity   Heart:    Regular rate and rhythm, S1 and S2 normal, no murmur, rub   or gallop  Breast Exam:    No tenderness, masses, or nipple abnormality  Abdomen:     Soft, bowel sounds active all four quadrants,    no masses, no organomegaly, diffuse tender upper ab.  Liver edge palpates 3 cm below RCM  Extremities:   Extremities normal, atraumatic, no cyanosis or edema  Pulses:   2+ and symmetric all extremities  Skin:   Skin color, texture, turgor normal, no rashes or lesions.  Few small keratoses and skin tags  Lymph nodes:   Cervical, supraclavicular, and axillary nodes normal  Neurologic:   Normal strength, sensation and reflexes    Throughout.  Foot exam done     ASSESSMENT/PLAN:  1. Coronary artery disease involving native coronary artery of native heart without angina pectoris   2. Type 2 diabetes mellitus with other circulatory complication, without long-term current use of insulin (HCC)  - CBC - COMPLETE METABOLIC PANEL WITH GFR - Hemoglobin A1c - Lipid panel - VITAMIN D 25 Hydroxy (Vit-D Deficiency, Fractures) - Microalbumin / creatinine urine ratio - Urinalysis, Routine w reflex microscopic - Ambulatory referral to Ophthalmology  3. Tobacco  abuse Counseled to quit  4. Physical exam, routine No new / abnormal findings  5. Pulmonary emphysema, unspecified emphysema type ([place]) stable  6. Screen for colon cancer Colo due  7. Post-menopause - DG Bone Density; Future   Patient Instructions  I have placed referral for colonoscopy I have placed order for diabetic eye examination I have ordered a bone density to check  Your bones  Lab tests were good.  Diabetes well controlled, cholesterol is at goal.  Good work.  Labs due again in six months  Congratulations on your walking and exercise.  It will improve your health and breathing.  Try to quit smoking.  See me in six months Call sooner for problems     Raylene Everts, MD

## 2016-09-19 NOTE — Patient Instructions (Signed)
I have placed referral for colonoscopy I have placed order for diabetic eye examination I have ordered a bone density to check  Your bones  Lab tests were good.  Diabetes well controlled, cholesterol is at goal.  Good work.  Labs due again in six months  Congratulations on your walking and exercise.  It will improve your health and breathing.  Try to quit smoking.  See me in six months Call sooner for problems

## 2016-09-23 ENCOUNTER — Ambulatory Visit (HOSPITAL_COMMUNITY)
Admission: RE | Admit: 2016-09-23 | Discharge: 2016-09-23 | Disposition: A | Discharge status: Home / Self Care | Payer: Medicare Other | Source: Ambulatory Visit | Attending: Family Medicine | Admitting: Family Medicine

## 2016-09-23 DIAGNOSIS — M8589 Other specified disorders of bone density and structure, multiple sites: Secondary | ICD-10-CM | POA: Insufficient documentation

## 2016-09-23 DIAGNOSIS — Z780 Asymptomatic menopausal state: Secondary | ICD-10-CM | POA: Diagnosis not present

## 2016-09-23 DIAGNOSIS — M85852 Other specified disorders of bone density and structure, left thigh: Secondary | ICD-10-CM | POA: Diagnosis not present

## 2016-09-23 LAB — HM DEXA SCAN

## 2016-09-26 ENCOUNTER — Encounter: Payer: Self-pay | Admitting: Family Medicine

## 2016-09-27 ENCOUNTER — Telehealth: Payer: Self-pay

## 2016-09-27 NOTE — Telephone Encounter (Signed)
Caregiver, Ivin Booty, left Vm that pt is ready to schedule colonoscopy. Call [phone].

## 2016-09-28 ENCOUNTER — Telehealth: Payer: Self-pay

## 2016-09-28 ENCOUNTER — Ambulatory Visit (INDEPENDENT_AMBULATORY_CARE_PROVIDER_SITE_OTHER): Payer: Medicare Other

## 2016-09-28 VITALS — BP 118/80 | HR 98 | Temp 98.6°F | Ht 61.0 in | Wt 136.1 lb

## 2016-09-28 DIAGNOSIS — Z0000 Encounter for general adult medical examination without abnormal findings: Secondary | ICD-10-CM

## 2016-09-28 NOTE — Telephone Encounter (Signed)
Please call patient back at [phone]. She is trying to set up colonoscopy

## 2016-09-28 NOTE — Progress Notes (Signed)
Subjective:   Kristin Wells is a 66 y.o. female who presents for an Initial Medicare Annual Wellness Visit.  Review of Systems:  Cardiac Risk Factors include: diabetes mellitus;dyslipidemia;hypertension;smoking/ tobacco exposure     Objective:    Today's Vitals   09/28/16 1505  BP: 118/80  Pulse: 98  Temp: 98.6 F (37 C)  TempSrc: Oral  SpO2: 97%  Weight: 136 lb 1.9 oz (61.7 kg)  Height: 5\' 1"  (1.549 m)   Body mass index is 25.72 kg/m.   Current Medications (verified) Outpatient Encounter Prescriptions as of 09/28/2016  Medication Sig  . Aclidinium Bromide (TUDORZA PRESSAIR) 400 MCG/ACT AEPB Inhale 2 puffs into the lungs 2 (two) times daily as needed.   Marland Kitchen albuterol (PROVENTIL HFA;VENTOLIN HFA) 108 (90 BASE) MCG/ACT inhaler Inhale 2 puffs into the lungs every 6 (six) hours as needed for wheezing or shortness of breath.  Marland Kitchen albuterol (PROVENTIL) (2.5 MG/3ML) 0.083% nebulizer solution Take 2.5 mg by nebulization every 6 (six) hours as needed for wheezing or shortness of breath.   Marland Kitchen aspirin EC 81 MG tablet Take 81 mg by mouth daily.  . cyclobenzaprine (FLEXERIL) 10 MG tablet Take 10 mg by mouth 2 (two) times daily.  . DULoxetine (CYMBALTA) 20 MG capsule Take 1 capsule (20 mg total) by mouth daily.  . fluticasone furoate-vilanterol (BREO ELLIPTA) 100-25 MCG/INH AEPB Inhale 1 puff into the lungs daily.  . furosemide (LASIX) 20 MG tablet Take 1 tablet (20 mg total) by mouth daily.  . Lactobacillus (ACIDOPHILUS PROBIOTIC) 100 MG CAPS Take 1 capsule by mouth daily.  Marland Kitchen levothyroxine (SYNTHROID, LEVOTHROID) 25 MCG tablet Take 25 mcg by mouth daily.  . metFORMIN (GLUCOPHAGE) 500 MG tablet Take 500 mg by mouth 2 (two) times daily with a meal.   . pantoprazole (PROTONIX) 40 MG tablet Take 40 mg by mouth daily.  . polyethylene glycol powder (GLYCOLAX/MIRALAX) powder Take 17 g by mouth daily. (Patient taking differently: Take 17 g by mouth daily as needed for mild constipation or moderate  constipation. )  . potassium chloride (K-DUR) 10 MEQ tablet Take 1 tablet (10 mEq total) by mouth daily.  . rosuvastatin (CRESTOR) 10 MG tablet Take 10 mg by mouth daily at 6 PM.  . Vitamin D, Ergocalciferol, (DRISDOL) 50000 UNITS CAPS capsule Take 50,000 Units by mouth every 7 (seven) days.   No facility-administered encounter medications on file as of 09/28/2016.     Allergies (verified) Patient has no known allergies.   History: Past Medical History:  Diagnosis Date  . Acute respiratory failure ([place]) 06/2011   Vent dependent sec to COPD/PNA  . Anxiety   . Arthritis   . Back pain   . Bronchitis   . CHF (congestive heart failure) ([place])   . COPD (chronic obstructive pulmonary disease) ([place])   . Coronary artery disease   . Demand ischemia of myocardium 06/28/2011  . Depression   . Diabetes mellitus without complication ([place])   . Hyperlipidemia   . Hypertension   . Ischemic cardiomyopathy 06/28/2011   EF 25%. Myoview stress test later showed ejection fraction of 65%  . MI, acute, non ST segment elevation ([place]) 06/29/2011   Myoview stress test revealed mild perfusion defect  . Myocardial infarction ([place])   . Oxygen deficiency   . PNA (pneumonia) 06/29/2011  . Pneumonia   . Rectocele 10/09/2012  . Substance abuse    MANY YEARS AGO  . Thyroid enlargement 06/28/2011   Past Surgical History:  Procedure Laterality Date  .  COLONOSCOPY  Feb 2013   Dr. Michail Sermon: internal hemorrhoids, normal view of ileum, multiple hyperplastic polyps  . ESOPHAGOGASTRODUODENOSCOPY  Feb 2013   Dr. Michail Sermon: normal duodenum, candida esophagitis, reactive gastropathy  . TUBAL LIGATION     Family History  Problem Relation Age of Onset  . Diabetes Mother   . Hypertension Mother   . Diabetes Father   . Hypertension Father   . Heart disease Father        HEART FAILURE  . Skin cancer Brother        [place] type  . Alcohol abuse Brother   . Seizures Sister   . Uterine cancer Sister   . Early death  Sister        Pneumonia at 23 months old  . Early death Son   . Drug abuse Son   . Heart attack Son   . Cancer Maternal Uncle   . Cirrhosis Brother   . Colon cancer Neg Hx    Social History   Occupational History  . Not on file.   Social History Main Topics  . Smoking status: Current Every Day Smoker    Packs/day: 0.50    Years: 49.00    Types: Cigarettes    Start date: 05/10/1967  . Smokeless tobacco: Never Used     Comment: START 1969  . Alcohol use No  . Drug use: No  . Sexual activity: No    Tobacco Counseling Ready to quit: Yes Counseling given: Yes   Activities of Daily Living In your present state of health, do you have any difficulty performing the following activities: 09/28/2016 08/19/2016  Hearing? Y N  Vision? Y N  Difficulty concentrating or making decisions? Y N  Walking or climbing stairs? N Y  Dressing or bathing? N N  Doing errands, shopping? Tempie Donning  Preparing Food and eating ? N -  Using the Toilet? N -  In the past six months, have you accidently leaked urine? Y -  Do you have problems with loss of bowel control? N -  Managing your Medications? N -  Managing your Finances? N -  Housekeeping or managing your Housekeeping? N -  Some recent data might be hidden    Immunizations and Health Maintenance Immunization History  Administered Date(s) Administered  . Influenza Split 06/29/2011  . Influenza,inj,Quad PF,36+ Mos 01/30/2013  . Pneumococcal Polysaccharide-23 06/29/2011   Health Maintenance Due  Topic Date Due  . FOOT EXAM  01/26/1962  . OPHTHALMOLOGY EXAM  01/26/1962  . TETANUS/TDAP  01/27/1971  . COLONOSCOPY  01/26/2002  . PAP SMEAR  10/10/2015    Patient Care Team: Raylene Everts, MD as PCP - General (Family Medicine)  Indicate any recent Medical Services you may have received from other than [facility] providers in the past year (date may be approximate).     Assessment:   This is a routine wellness examination for  Kristin Wells.  Hearing/Vision screen Vision Screening Comments: Follow up with Dr. [name]. Has an appointment next month.  Dietary issues and exercise activities discussed: Current Exercise Habits: Home exercise routine, Type of exercise: walking, Time (Minutes): 40, Frequency (Times/Week): 6, Weekly Exercise (Minutes/Week): 240, Intensity: Mild, Exercise limited by: respiratory conditions(s)  Goals    . HEMOGLOBIN A1C < 7.0    . STOP SMOKING      Depression Screen PHQ 2/9 Scores 09/28/2016 08/19/2016  PHQ - 2 Score 5 0  PHQ- 9 Score 7 -    Fall Risk Fall Risk  09/28/2016 08/19/2016  Falls in the past year? No Yes  Number falls in past yr: - 2 or more  Injury with Fall? - Yes    Cognitive Function: Normal MMSE - Mini Mental State Exam 09/28/2016  Orientation to time 4  Orientation to Place 5  Registration 3  Attention/ Calculation 5  Recall 3  Language- name 2 objects 2  Language- repeat 1  Language- follow 3 step command 3  Language- read & follow direction 1  Write a sentence 1  Copy design 1  Total score 29        Screening Tests Health Maintenance  Topic Date Due  . FOOT EXAM  01/26/1962  . OPHTHALMOLOGY EXAM  01/26/1962  . TETANUS/TDAP  01/27/1971  . COLONOSCOPY  01/26/2002  . PAP SMEAR  10/10/2015  . INFLUENZA VACCINE  12/07/2016  . HEMOGLOBIN A1C  02/18/2017  . URINE MICROALBUMIN  08/19/2017  . MAMMOGRAM  08/04/2018  . Hepatitis C Screening  Completed  . HIV Screening  Completed      Plan:   I have personally reviewed and noted the following in the patient's chart:   . Medical and social history . Use of alcohol, tobacco or illicit drugs  . Current medications and supplements . Functional ability and status . Nutritional status . Physical activity . Advanced directives . List of other physicians . Hospitalizations, surgeries, and ER visits in previous 12 months . Vitals . Screenings to include cognitive, depression, and falls . Referrals and  appointments: [employer] referral sent in today.  In addition, I have reviewed and discussed with patient certain preventive protocols, quality metrics, and best practice recommendations. A written personalized care plan for preventive services as well as general preventive health recommendations were provided to patient.     Stormy Fabian, LPN   [id number]

## 2016-09-28 NOTE — Patient Instructions (Addendum)
Kristin Wells , Thank you for taking time to come for your Medicare Wellness Visit. I appreciate your ongoing commitment to your health goals. Please review the following plan we discussed and let me know if I can assist you in the future.   Screening recommendations/referrals: Colonoscopy: Overdue, 06/2016. Please call [phone] to schedule this appointment as soon as you are able. Mammogram: Up to date, next due 07/2017 Bone Density: Up to date Diabetic Eye Exam: Overdue, appointment scheduled with [facility]. [name] Recommended yearly dental visit for hygiene and checkup  Vaccinations: Influenza vaccine: Up to date, next due 12/2016 Pneumococcal vaccine: Up to date, next due 02/2017 Tdap vaccine: Due, declines  Shingles vaccine: Due, declines    Advanced directives: Advance directive discussed with you today. I have provided a copy for you to complete at home and have notarized. Once this is complete please bring a copy in to our office so we can scan it into your chart.  Conditions/risks identified: Pre-obese, recommend continuing your current routine exercise program as tolerated.   Next appointment: Follow up with Dr. Meda Coffee on 03/20/2017 at 2:40 pm. Follow up in 1 year for your annual wellness visit.   Preventive Care 40-64 Years, Female Preventive care refers to lifestyle choices and visits with your health care provider that can promote health and wellness. What does preventive care include?  A yearly physical exam. This is also called an annual well check.  Dental exams once or twice a year.  Routine eye exams. Ask your health care provider how often you should have your eyes checked.  Personal lifestyle choices, including:  Daily care of your teeth and gums.  Regular physical activity.  Eating a healthy diet.  Avoiding tobacco and drug use.  Limiting alcohol use.  Practicing safe sex.  Taking low-dose aspirin daily starting at age 60.  Taking vitamin and mineral  supplements as recommended by your health care provider. What happens during an annual well check? The services and screenings done by your health care provider during your annual well check will depend on your age, overall health, lifestyle risk factors, and family history of disease. Counseling  Your health care provider may ask you questions about your:  Alcohol use.  Tobacco use.  Drug use.  Emotional well-being.  Home and relationship well-being.  Sexual activity.  Eating habits.  Work and work Statistician.  Method of birth control.  Menstrual cycle.  Pregnancy history. Screening  You may have the following tests or measurements:  Height, weight, and BMI.  Blood pressure.  Lipid and cholesterol levels. These may be checked every 5 years, or more frequently if you are over 72 years old.  Skin check.  Lung cancer screening. You may have this screening every year starting at age 32 if you have a 30-pack-year history of smoking and currently smoke or have quit within the past 15 years.  Fecal occult blood test (FOBT) of the stool. You may have this test every year starting at age 53.  Flexible sigmoidoscopy or colonoscopy. You may have a sigmoidoscopy every 5 years or a colonoscopy every 10 years starting at age 34.  Hepatitis C blood test.  Hepatitis B blood test.  Sexually transmitted disease (STD) testing.  Diabetes screening. This is done by checking your blood sugar (glucose) after you have not eaten for a while (fasting). You may have this done every 1-3 years.  Mammogram. This may be done every 1-2 years. Talk to your health care provider about  when you should start having regular mammograms. This may depend on whether you have a family history of breast cancer.  BRCA-related cancer screening. This may be done if you have a family history of breast, ovarian, tubal, or peritoneal cancers.  Pelvic exam and Pap test. This may be done every 3 years starting  at age 47. Starting at age 41, this may be done every 5 years if you have a Pap test in combination with an HPV test.  Bone density scan. This is done to screen for osteoporosis. You may have this scan if you are at high risk for osteoporosis. Discuss your test results, treatment options, and if necessary, the need for more tests with your health care provider. Vaccines  Your health care provider may recommend certain vaccines, such as:  Influenza vaccine. This is recommended every year.  Tetanus, diphtheria, and acellular pertussis (Tdap, Td) vaccine. You may need a Td booster every 10 years.  Zoster vaccine. You may need this after age 48.  Pneumococcal 13-valent conjugate (PCV13) vaccine. You may need this if you have certain conditions and were not previously vaccinated.  Pneumococcal polysaccharide (PPSV23) vaccine. You may need one or two doses if you smoke cigarettes or if you have certain conditions. Talk to your health care provider about which screenings and vaccines you need and how often you need them. This information is not intended to replace advice given to you by your health care provider. Make sure you discuss any questions you have with your health care provider. Document Released: 05/22/2015 Document Revised: 01/13/2016 Document Reviewed: 02/24/2015 Elsevier Interactive Patient Education  2017 [employer].    Fall Prevention in the Home Falls can cause injuries. They can happen to people of all ages. There are many things you can do to make your home safe and to help prevent falls. What can I do on the outside of my home?  Regularly fix the edges of walkways and driveways and fix any cracks.  Remove anything that might make you trip as you walk through a door, such as a raised step or threshold.  Trim any bushes or trees on the path to your home.  Use bright outdoor lighting.  Clear any walking paths of anything that might make someone trip, such as rocks or  tools.  Regularly check to see if handrails are loose or broken. Make sure that both sides of any steps have handrails.  Any raised decks and porches should have guardrails on the edges.  Have any leaves, snow, or ice cleared regularly.  Use sand or salt on walking paths during winter.  Clean up any spills in your garage right away. This includes oil or grease spills. What can I do in the bathroom?  Use night lights.  Install grab bars by the toilet and in the tub and shower. Do not use towel bars as grab bars.  Use non-skid mats or decals in the tub or shower.  If you need to sit down in the shower, use a plastic, non-slip stool.  Keep the floor dry. Clean up any water that spills on the floor as soon as it happens.  Remove soap buildup in the tub or shower regularly.  Attach bath mats securely with double-sided non-slip rug tape.  Do not have throw rugs and other things on the floor that can make you trip. What can I do in the bedroom?  Use night lights.  Make sure that you have a light by your bed  that is easy to reach.  Do not use any sheets or blankets that are too big for your bed. They should not hang down onto the floor.  Have a firm chair that has side arms. You can use this for support while you get dressed.  Do not have throw rugs and other things on the floor that can make you trip. What can I do in the kitchen?  Clean up any spills right away.  Avoid walking on wet floors.  Keep items that you use a lot in easy-to-reach places.  If you need to reach something above you, use a strong step stool that has a grab bar.  Keep electrical cords out of the way.  Do not use floor polish or wax that makes floors slippery. If you must use wax, use non-skid floor wax.  Do not have throw rugs and other things on the floor that can make you trip. What can I do with my stairs?  Do not leave any items on the stairs.  Make sure that there are handrails on both  sides of the stairs and use them. Fix handrails that are broken or loose. Make sure that handrails are as long as the stairways.  Check any carpeting to make sure that it is firmly attached to the stairs. Fix any carpet that is loose or worn.  Avoid having throw rugs at the top or bottom of the stairs. If you do have throw rugs, attach them to the floor with carpet tape.  Make sure that you have a light switch at the top of the stairs and the bottom of the stairs. If you do not have them, ask someone to add them for you. What else can I do to help prevent falls?  Wear shoes that:  Do not have high heels.  Have rubber bottoms.  Are comfortable and fit you well.  Are closed at the toe. Do not wear sandals.  If you use a stepladder:  Make sure that it is fully opened. Do not climb a closed stepladder.  Make sure that both sides of the stepladder are locked into place.  Ask someone to hold it for you, if possible.  Clearly mark and make sure that you can see:  Any grab bars or handrails.  First and last steps.  Where the edge of each step is.  Use tools that help you move around (mobility aids) if they are needed. These include:  Canes.  Walkers.  Scooters.  Crutches.  Turn on the lights when you go into a dark area. Replace any light bulbs as soon as they burn out.  Set up your furniture so you have a clear path. Avoid moving your furniture around.  If any of your floors are uneven, fix them.  If there are any pets around you, be aware of where they are.  Review your medicines with your doctor. Some medicines can make you feel dizzy. This can increase your chance of falling.  Steps to Quit Smoking Smoking tobacco can be bad for your health. It can also affect almost every organ in your body. Smoking puts you and people around you at risk for many serious long-lasting (chronic) diseases. Quitting smoking is hard, but it is one of the best things that you can do for  your health. It is never too late to quit. What are the benefits of quitting smoking? When you quit smoking, you lower your risk for getting serious diseases and conditions. They  can include: Lung cancer or lung disease. Heart disease. Stroke. Heart attack. Not being able to have children (infertility). Weak bones (osteoporosis) and broken bones (fractures). If you have coughing, wheezing, and shortness of breath, those symptoms may get better when you quit. You may also get sick less often. If you are pregnant, quitting smoking can help to lower your chances of having a baby of low birth weight. What can I do to help me quit smoking? Talk with your doctor about what can help you quit smoking. Some things you can do (strategies) include: Quitting smoking totally, instead of slowly cutting back how much you smoke over a period of time. Going to in-person counseling. You are more likely to quit if you go to many counseling sessions. Using resources and support systems, such as: Online chats with a Social worker. Phone quitlines. Printed Furniture conservator/restorer. Support groups or group counseling. Text messaging programs. Mobile phone apps or applications. Taking medicines. Some of these medicines may have nicotine in them. If you are pregnant or breastfeeding, do not take any medicines to quit smoking unless your doctor says it is okay. Talk with your doctor about counseling or other things that can help you. Talk with your doctor about using more than one strategy at the same time, such as taking medicines while you are also going to in-person counseling. This can help make quitting easier. What things can I do to make it easier to quit? Quitting smoking might feel very hard at first, but there is a lot that you can do to make it easier. Take these steps: Talk to your family and friends. Ask them to support and encourage you. Call phone quitlines, reach out to support groups, or work with a  Social worker. Ask people who smoke to not smoke around you. Avoid places that make you want (trigger) to smoke, such as: Bars. Parties. Smoke-break areas at work. Spend time with people who do not smoke. Lower the stress in your life. Stress can make you want to smoke. Try these things to help your stress: Getting regular exercise. Deep-breathing exercises. Yoga. Meditating. Doing a body scan. To do this, close your eyes, focus on one area of your body at a time from head to toe, and notice which parts of your body are tense. Try to relax the muscles in those areas. Download or buy apps on your mobile phone or tablet that can help you stick to your quit plan. There are many free apps, such as QuitGuide from the [employer] Office manager for Disease Control and Prevention). You can find more support from smokefree.gov and other websites. This information is not intended to replace advice given to you by your health care provider. Make sure you discuss any questions you have with your health care provider. Document Released: 02/19/2009 Document Revised: 12/22/2015 Document Reviewed: 09/09/2014 Elsevier Interactive Patient Education  2017 [place] your doctor what other things that you can do to help prevent falls. This information is not intended to replace advice given to you by your health care provider. Make sure you discuss any questions you have with your health care provider. Document Released: 02/19/2009 Document Revised: 10/01/2015 Document Reviewed: 05/30/2014 Elsevier Interactive Patient Education  2017 [employer].

## 2016-09-30 DIAGNOSIS — J449 Chronic obstructive pulmonary disease, unspecified: Secondary | ICD-10-CM | POA: Diagnosis not present

## 2016-10-03 DIAGNOSIS — J9690 Respiratory failure, unspecified, unspecified whether with hypoxia or hypercapnia: Secondary | ICD-10-CM | POA: Diagnosis not present

## 2016-10-04 NOTE — Telephone Encounter (Signed)
I spoke to pt and she is not having any GI problems at this time. She said her last colonoscopy was about 4 years ago and done in [place]. She was to go back about now because she had polyps. She does not have a family hx of colon cancer. She will try to check on the records and give me a call back.

## 2016-10-05 NOTE — Telephone Encounter (Signed)
See separate note. Pt checking on previous reports and to call me back.

## 2016-10-17 DIAGNOSIS — Z800 Family history of malignant neoplasm of digestive organs: Secondary | ICD-10-CM | POA: Diagnosis not present

## 2016-10-31 DIAGNOSIS — J449 Chronic obstructive pulmonary disease, unspecified: Secondary | ICD-10-CM | POA: Diagnosis not present

## 2016-11-03 DIAGNOSIS — J9690 Respiratory failure, unspecified, unspecified whether with hypoxia or hypercapnia: Secondary | ICD-10-CM | POA: Diagnosis not present

## 2016-11-11 ENCOUNTER — Other Ambulatory Visit: Payer: Self-pay | Admitting: Family Medicine

## 2016-11-11 DIAGNOSIS — F411 Generalized anxiety disorder: Secondary | ICD-10-CM

## 2016-11-17 NOTE — Telephone Encounter (Signed)
I am mailing a reminder to caregiver that I have not received previous records.

## 2016-12-31 DIAGNOSIS — J449 Chronic obstructive pulmonary disease, unspecified: Secondary | ICD-10-CM | POA: Diagnosis not present

## 2017-01-03 DIAGNOSIS — J9690 Respiratory failure, unspecified, unspecified whether with hypoxia or hypercapnia: Secondary | ICD-10-CM | POA: Diagnosis not present

## 2017-01-30 ENCOUNTER — Emergency Department (HOSPITAL_COMMUNITY): Payer: Medicare Other

## 2017-01-30 ENCOUNTER — Observation Stay (HOSPITAL_COMMUNITY)
Admission: EM | Admit: 2017-01-30 | Discharge: 2017-01-31 | Disposition: A | Discharge status: Home / Self Care | Payer: Medicare Other | Attending: Internal Medicine | Admitting: Internal Medicine

## 2017-01-30 ENCOUNTER — Encounter (HOSPITAL_COMMUNITY): Payer: Self-pay | Admitting: Emergency Medicine

## 2017-01-30 DIAGNOSIS — I252 Old myocardial infarction: Secondary | ICD-10-CM | POA: Diagnosis not present

## 2017-01-30 DIAGNOSIS — Z66 Do not resuscitate: Secondary | ICD-10-CM | POA: Insufficient documentation

## 2017-01-30 DIAGNOSIS — I2510 Atherosclerotic heart disease of native coronary artery without angina pectoris: Secondary | ICD-10-CM | POA: Diagnosis not present

## 2017-01-30 DIAGNOSIS — F17210 Nicotine dependence, cigarettes, uncomplicated: Secondary | ICD-10-CM | POA: Diagnosis not present

## 2017-01-30 DIAGNOSIS — I110 Hypertensive heart disease with heart failure: Secondary | ICD-10-CM | POA: Diagnosis not present

## 2017-01-30 DIAGNOSIS — E119 Type 2 diabetes mellitus without complications: Secondary | ICD-10-CM | POA: Diagnosis not present

## 2017-01-30 DIAGNOSIS — R062 Wheezing: Secondary | ICD-10-CM | POA: Diagnosis not present

## 2017-01-30 DIAGNOSIS — R0602 Shortness of breath: Secondary | ICD-10-CM | POA: Diagnosis present

## 2017-01-30 DIAGNOSIS — E039 Hypothyroidism, unspecified: Secondary | ICD-10-CM | POA: Insufficient documentation

## 2017-01-30 DIAGNOSIS — I10 Essential (primary) hypertension: Secondary | ICD-10-CM | POA: Diagnosis not present

## 2017-01-30 DIAGNOSIS — J441 Chronic obstructive pulmonary disease with (acute) exacerbation: Principal | ICD-10-CM | POA: Diagnosis present

## 2017-01-30 DIAGNOSIS — Z7984 Long term (current) use of oral hypoglycemic drugs: Secondary | ICD-10-CM | POA: Diagnosis not present

## 2017-01-30 DIAGNOSIS — Z79899 Other long term (current) drug therapy: Secondary | ICD-10-CM | POA: Insufficient documentation

## 2017-01-30 DIAGNOSIS — J9611 Chronic respiratory failure with hypoxia: Secondary | ICD-10-CM | POA: Diagnosis present

## 2017-01-30 DIAGNOSIS — Z720 Tobacco use: Secondary | ICD-10-CM | POA: Diagnosis not present

## 2017-01-30 DIAGNOSIS — I5022 Chronic systolic (congestive) heart failure: Secondary | ICD-10-CM | POA: Diagnosis present

## 2017-01-30 DIAGNOSIS — F17200 Nicotine dependence, unspecified, uncomplicated: Secondary | ICD-10-CM | POA: Diagnosis present

## 2017-01-30 DIAGNOSIS — Z7982 Long term (current) use of aspirin: Secondary | ICD-10-CM | POA: Insufficient documentation

## 2017-01-30 LAB — BASIC METABOLIC PANEL
Anion gap: 12 (ref 5–15)
BUN: 14 mg/dL (ref 6–20)
CO2: 29 mmol/L (ref 22–32)
Calcium: 9.9 mg/dL (ref 8.9–10.3)
Chloride: 101 mmol/L (ref 101–111)
Creatinine, Ser: 0.91 mg/dL (ref 0.44–1.00)
GFR calc Af Amer: >60 mL/min (ref >60)
GFR calc non Af Amer: >60 mL/min (ref >60)
Glucose, Bld: 89 mg/dL (ref 65–99)
Potassium: 4.7 mmol/L (ref 3.5–5.1)
Sodium: 142 mmol/L (ref 135–145)

## 2017-01-30 LAB — CBC WITH DIFFERENTIAL/PLATELET
Basophils Absolute: 0 10*3/uL (ref 0.0–0.1)
Basophils Relative: 1 %
Eosinophils Absolute: 0.4 10*3/uL (ref 0.0–0.7)
Eosinophils Relative: 5 %
HCT: 47.7 % — ABNORMAL HIGH (ref 36.0–46.0)
Hemoglobin: 15.9 g/dL — ABNORMAL HIGH (ref 12.0–15.0)
Lymphocytes Relative: 28 %
Lymphs Abs: 2.5 10*3/uL (ref 0.7–4.0)
MCH: 29.1 pg (ref 26.0–34.0)
MCHC: 33.3 g/dL (ref 30.0–36.0)
MCV: 87.4 fL (ref 78.0–100.0)
Monocytes Absolute: 0.6 10*3/uL (ref 0.1–1.0)
Monocytes Relative: 7 %
Neutro Abs: 5.3 10*3/uL (ref 1.7–7.7)
Neutrophils Relative %: 59 %
Platelets: 311 10*3/uL (ref 150–400)
RBC: 5.46 MIL/uL — ABNORMAL HIGH (ref 3.87–5.11)
RDW: 14.1 % (ref 11.5–15.5)
WBC: 8.8 10*3/uL (ref 4.0–10.5)

## 2017-01-30 LAB — GLUCOSE, CAPILLARY: Glucose-Capillary: 251 mg/dL — ABNORMAL HIGH (ref 65–99)

## 2017-01-30 MED ORDER — SODIUM CHLORIDE 0.9% FLUSH
3.0000 mL | Freq: Two times a day (BID) | INTRAVENOUS | Status: DC
  Administered 2017-01-31 (×2): 3 mL via INTRAVENOUS

## 2017-01-30 MED ORDER — SODIUM CHLORIDE 0.9 % IV SOLN: 250.0000 mL | INTRAVENOUS | Status: DC | PRN

## 2017-01-30 MED ORDER — ROSUVASTATIN CALCIUM 10 MG PO TABS
10.0000 mg | ORAL_TABLET | Freq: Every day | ORAL | Status: DC
  Administered 2017-01-31: 10 mg via ORAL
  Filled 2017-01-30: qty 1

## 2017-01-30 MED ORDER — GUAIFENESIN ER 600 MG PO TB12
600.0000 mg | ORAL_TABLET | Freq: Two times a day (BID) | ORAL | Status: DC
  Administered 2017-01-31 (×2): 600 mg via ORAL
  Filled 2017-01-30 (×2): qty 1

## 2017-01-30 MED ORDER — ONDANSETRON HCL 4 MG/2ML IJ SOLN: 4.0000 mg | Freq: Four times a day (QID) | INTRAMUSCULAR | Status: DC | PRN

## 2017-01-30 MED ORDER — SODIUM CHLORIDE 0.9% FLUSH: 3.0000 mL | INTRAVENOUS | Status: DC | PRN

## 2017-01-30 MED ORDER — ASPIRIN EC 81 MG PO TBEC
81.0000 mg | DELAYED_RELEASE_TABLET | Freq: Every day | ORAL | Status: DC
  Administered 2017-01-31: 81 mg via ORAL
  Filled 2017-01-30: qty 1

## 2017-01-30 MED ORDER — FUROSEMIDE 20 MG PO TABS
20.0000 mg | ORAL_TABLET | Freq: Every day | ORAL | Status: DC
  Administered 2017-01-31: 20 mg via ORAL
  Filled 2017-01-30: qty 1

## 2017-01-30 MED ORDER — PREDNISONE 10 MG PO TABS
60.0000 mg | ORAL_TABLET | Freq: Once | ORAL | Status: AC
  Administered 2017-01-30: 60 mg via ORAL
  Filled 2017-01-30: qty 1

## 2017-01-30 MED ORDER — PANTOPRAZOLE SODIUM 40 MG PO TBEC
40.0000 mg | DELAYED_RELEASE_TABLET | Freq: Every day | ORAL | Status: DC
  Administered 2017-01-31: 40 mg via ORAL
  Filled 2017-01-30: qty 1

## 2017-01-30 MED ORDER — ALBUTEROL SULFATE (2.5 MG/3ML) 0.083% IN NEBU: 2.5000 mg | INHALATION_SOLUTION | Freq: Four times a day (QID) | RESPIRATORY_TRACT | Status: DC

## 2017-01-30 MED ORDER — ALBUTEROL (5 MG/ML) CONTINUOUS INHALATION SOLN
10.0000 mg/h | INHALATION_SOLUTION | Freq: Once | RESPIRATORY_TRACT | Status: AC
  Administered 2017-01-30: 10 mg/h via RESPIRATORY_TRACT
  Filled 2017-01-30: qty 20

## 2017-01-30 MED ORDER — ONDANSETRON HCL 4 MG PO TABS: 4.0000 mg | ORAL_TABLET | Freq: Four times a day (QID) | ORAL | Status: DC | PRN

## 2017-01-30 MED ORDER — IPRATROPIUM BROMIDE 0.02 % IN SOLN
1.0000 mg | Freq: Once | RESPIRATORY_TRACT | Status: AC
  Administered 2017-01-30: 1 mg via RESPIRATORY_TRACT
  Filled 2017-01-30: qty 5

## 2017-01-30 MED ORDER — IPRATROPIUM BROMIDE 0.02 % IN SOLN: 0.5000 mg | Freq: Four times a day (QID) | RESPIRATORY_TRACT | Status: DC

## 2017-01-30 MED ORDER — CYCLOBENZAPRINE HCL 10 MG PO TABS
10.0000 mg | ORAL_TABLET | Freq: Two times a day (BID) | ORAL | Status: DC
  Administered 2017-01-31 (×2): 10 mg via ORAL
  Filled 2017-01-30 (×2): qty 1

## 2017-01-30 MED ORDER — PREDNISONE 20 MG PO TABS
50.0000 mg | ORAL_TABLET | Freq: Every day | ORAL | Status: DC
  Administered 2017-01-31: 50 mg via ORAL
  Filled 2017-01-30: qty 2

## 2017-01-30 MED ORDER — INSULIN ASPART 100 UNIT/ML ~~LOC~~ SOLN
0.0000 [IU] | Freq: Three times a day (TID) | SUBCUTANEOUS | Status: DC
  Administered 2017-01-31: 2 [IU] via SUBCUTANEOUS

## 2017-01-30 MED ORDER — LEVOTHYROXINE SODIUM 25 MCG PO TABS
25.0000 ug | ORAL_TABLET | Freq: Every day | ORAL | Status: DC
  Administered 2017-01-31: 25 ug via ORAL
  Filled 2017-01-30: qty 1

## 2017-01-30 MED ORDER — FLUTICASONE FUROATE-VILANTEROL 100-25 MCG/INH IN AEPB
1.0000 | INHALATION_SPRAY | Freq: Every day | RESPIRATORY_TRACT | Status: DC
  Administered 2017-01-31: 1 via RESPIRATORY_TRACT
  Filled 2017-01-30: qty 28

## 2017-01-30 MED ORDER — POTASSIUM CHLORIDE CRYS ER 10 MEQ PO TBCR
10.0000 meq | EXTENDED_RELEASE_TABLET | Freq: Every day | ORAL | Status: DC
  Administered 2017-01-31: 10 meq via ORAL
  Filled 2017-01-30 (×3): qty 1

## 2017-01-30 MED ORDER — ENOXAPARIN SODIUM 40 MG/0.4ML ~~LOC~~ SOLN
40.0000 mg | SUBCUTANEOUS | Status: DC
  Administered 2017-01-31: 40 mg via SUBCUTANEOUS
  Filled 2017-01-30: qty 0.4

## 2017-01-30 NOTE — ED Notes (Signed)
Ambulated patient around nursing station, patient's heart rate 125, respirations 36, O2 sat 89-91 percent on 2 liters. Returned patient to room O2 sat 89. Gave patient time to rest and O2 sat 97 percent on 2l. Patient asking if we can check her CBG, patient asking for Coke and nabs at this time.

## 2017-01-30 NOTE — ED Provider Notes (Signed)
[facility] DEPT Provider Note   CSN: [id number] Arrival date & time: 01/30/17  1225     History   Chief Complaint Chief Complaint  Patient presents with  . Asthma    HPI Kristin Wells is a 66 y.o. female.  HPI  Pt was seen at 1640.  Per pt, c/o gradual onset and worsening of persistent cough, wheezing and SOB for the past 2 days.  Describes her symptoms as "my COPD is acting up."  Has been using home MDI and nebs without relief. Pt states she "thinks it was from being outside in the heat Saturday night." Denies CP/palpitations, no back pain, no abd pain, no N/V/D, no fevers, no rash.    Past Medical History:  Diagnosis Date  . Acute respiratory failure ([place]) 06/2011   Vent dependent sec to COPD/PNA  . Anxiety   . Arthritis   . Back pain   . Bronchitis   . CHF (congestive heart failure) ([place])   . COPD (chronic obstructive pulmonary disease) ([place])   . Coronary artery disease   . Demand ischemia of myocardium 06/28/2011  . Depression   . Diabetes mellitus without complication ([place])   . Hyperlipidemia   . Hypertension   . Ischemic cardiomyopathy 06/28/2011   EF 25%. Myoview stress test later showed ejection fraction of 65%  . MI, acute, non ST segment elevation ([place]) 06/29/2011   Myoview stress test revealed mild perfusion defect  . Myocardial infarction ([place])   . Oxygen deficiency   . PNA (pneumonia) 06/29/2011  . Pneumonia   . Rectocele 10/09/2012  . Substance abuse    MANY YEARS AGO  . Thyroid enlargement 06/28/2011    Patient Active Problem List   Diagnosis Date Noted  . Onychomycosis 05/28/2014  . Diabetic foot ulcer ([place]) 04/30/2014  . Rectocele 10/09/2012  . Diabetes ([place]) 10/05/2012  . CAD (coronary artery disease) 02/21/2012  . Systolic CHF, chronic ([place]) 02/21/2012  . Chronic back pain 02/20/2012  . Hypercapnia 07/30/2011  . HTN (hypertension) 07/11/2011  . Tobacco abuse 07/11/2011  . Anxiety disorder 07/11/2011  . Thyroid enlargement 06/28/2011    . Ischemic cardiomyopathy 06/28/2011  . DEPRESSION 02/04/2010  . COPD with emphysema ([place]) 02/04/2010    Past Surgical History:  Procedure Laterality Date  . COLONOSCOPY  Feb 2013   Dr. Michail Sermon: internal hemorrhoids, normal view of ileum, multiple hyperplastic polyps  . ESOPHAGOGASTRODUODENOSCOPY  Feb 2013   Dr. Michail Sermon: normal duodenum, candida esophagitis, reactive gastropathy  . TUBAL LIGATION      OB History    Gravida Para Term Preterm AB Living   8 7     1 6    SAB TAB Ectopic Multiple Live Births   1               Home Medications    Prior to Admission medications   Medication Sig Start Date End Date Taking? Authorizing Provider  Aclidinium Bromide (TUDORZA PRESSAIR) 400 MCG/ACT AEPB Inhale 2 puffs into the lungs 2 (two) times daily as needed.     [provider]  albuterol (PROVENTIL HFA;VENTOLIN HFA) 108 (90 BASE) MCG/ACT inhaler Inhale 2 puffs into the lungs every 6 (six) hours as needed for wheezing or shortness of breath.    [provider]  albuterol (PROVENTIL) (2.5 MG/3ML) 0.083% nebulizer solution Take 2.5 mg by nebulization every 6 (six) hours as needed for wheezing or shortness of breath.  07/14/11   Kathie Dike, MD  aspirin EC 81 MG tablet Take  81 mg by mouth daily.    [provider]  cyclobenzaprine (FLEXERIL) 10 MG tablet Take 10 mg by mouth 2 (two) times daily.    [provider]  DULoxetine (CYMBALTA) 20 MG capsule TAKE 1 CAPSULE BY MOUTH EVERY DAY. 11/11/16   Raylene Everts, MD  fluticasone furoate-vilanterol (BREO ELLIPTA) 100-25 MCG/INH AEPB Inhale 1 puff into the lungs daily.    [provider]  furosemide (LASIX) 20 MG tablet Take 1 tablet (20 mg total) by mouth daily. 01/10/14   Lendon Colonel, NP  Lactobacillus (ACIDOPHILUS PROBIOTIC) 100 MG CAPS Take 1 capsule by mouth daily.    [provider]  levothyroxine (SYNTHROID, LEVOTHROID) 25 MCG tablet Take 25 mcg by mouth daily. 08/29/13    [provider]  metFORMIN (GLUCOPHAGE) 500 MG tablet Take 500 mg by mouth 2 (two) times daily with a meal.  02/22/13   [provider]  pantoprazole (PROTONIX) 40 MG tablet Take 40 mg by mouth daily.    [provider]  polyethylene glycol powder (GLYCOLAX/MIRALAX) powder Take 17 g by mouth daily. Patient taking differently: Take 17 g by mouth daily as needed for mild constipation or moderate constipation.  07/14/11   Kathie Dike, MD  potassium chloride (K-DUR) 10 MEQ tablet Take 1 tablet (10 mEq total) by mouth daily. 08/22/16   Raylene Everts, MD  rosuvastatin (CRESTOR) 10 MG tablet Take 10 mg by mouth daily at 6 PM. 02/26/13   Lendon Colonel, NP  Vitamin D, Ergocalciferol, (DRISDOL) 50000 UNITS CAPS capsule Take 50,000 Units by mouth every 7 (seven) days.    [provider]    Family History Family History  Problem Relation Age of Onset  . Diabetes Mother   . Hypertension Mother   . Diabetes Father   . Hypertension Father   . Heart disease Father        HEART FAILURE  . Skin cancer Brother        Unkonwn type  . Alcohol abuse Brother   . Seizures Sister   . Uterine cancer Sister   . Early death Sister        Pneumonia at 26 months old  . Early death Son   . Drug abuse Son   . Heart attack Son   . Cancer Maternal Uncle   . Cirrhosis Brother   . Colon cancer Neg Hx     Social History Social History  Substance Use Topics  . Smoking status: Current Every Day Smoker    Packs/day: 0.50    Years: 49.00    Types: Cigarettes    Start date: 05/10/1967  . Smokeless tobacco: Never Used     Comment: START 1969  . Alcohol use No     Allergies   Patient has no known allergies.   Review of Systems Review of Systems ROS: Statement: All systems negative except as marked or noted in the HPI; Constitutional: Negative for fever and chills. ; ; Eyes: Negative for eye pain, redness and discharge. ; ; ENMT: Negative for ear pain,  hoarseness, nasal congestion, sinus pressure and sore throat. ; ; Cardiovascular: Negative for chest pain, palpitations, diaphoresis, and peripheral edema. ; ; Respiratory: +cough, wheeze, SOB. Negative for stridor. ; ; Gastrointestinal: Negative for nausea, vomiting, diarrhea, abdominal pain, blood in stool, hematemesis, jaundice and rectal bleeding. . ; ; Genitourinary: Negative for dysuria, flank pain and hematuria. ; ; Musculoskeletal: Negative for back pain and neck pain. Negative for swelling and  trauma.; ; Skin: Negative for pruritus, rash, abrasions, blisters, bruising and skin lesion.; ; Neuro: Negative for headache, lightheadedness and neck stiffness. Negative for weakness, altered level of consciousness, altered mental status, extremity weakness, paresthesias, involuntary movement, seizure and syncope.       Physical Exam Updated Vital Signs BP (!) 143/88   Pulse 99   Temp 98.2 F (36.8 C) (Oral)   Resp (!) 24   Ht 5\' 2"  (1.575 m)   Wt 59.9 kg (132 lb)   SpO2 91%   BMI 24.14 kg/m     Patient Vitals for the past 24 hrs:  BP Temp Temp src Pulse Resp SpO2 Height Weight  01/30/17 1646 (!) 143/88 - - 99 - 91 % - -  01/30/17 1232 - - - - - - 5\' 2"  (1.575 m) 59.9 kg (132 lb)  01/30/17 1231 (!) 151/102 98.2 F (36.8 C) Oral (!) 111 (!) 24 95 % - -      Physical Exam 1645: Physical examination:  Nursing notes reviewed; Vital signs and O2 SAT reviewed;  Constitutional: Well developed, Well nourished, Well hydrated, Uncomfortable appearing.;; Head:  Normocephalic, atraumatic; Eyes: EOMI, PERRL, No scleral icterus; ENMT: Mouth and pharynx normal, Mucous membranes moist; Neck: Supple, Full range of motion, No lymphadenopathy; Cardiovascular: Tachycardic rate and rhythm, No gallop; Respiratory: Breath sounds diminished & equal bilaterally, insp/exp wheezes bilat. Faint audible wheezing.  Speaking short sentences, sitting upright, tachypneic.; Chest: Nontender, Movement normal; Abdomen:  Soft, Nontender, Nondistended, Normal bowel sounds; Genitourinary: No CVA tenderness; Extremities: Pulses normal, No tenderness, No edema, No calf edema or asymmetry.; Neuro: AA&Ox3, Major CN grossly intact.  Speech clear. No gross focal motor or sensory deficits in extremities.; Skin: Color normal, Warm, Dry.   ED Treatments / Results  Labs (all labs ordered are listed, but only abnormal results are displayed)   EKG  EKG Interpretation None       Radiology   Procedures Procedures (including critical care time)  Medications Ordered in ED Medications  predniSONE (DELTASONE) tablet 60 mg (not administered)  albuterol (PROVENTIL,VENTOLIN) solution continuous neb (not administered)  ipratropium (ATROVENT) nebulizer solution 1 mg (not administered)     Initial Impression / Assessment and Plan / ED Course  I have reviewed the triage vital signs and the nursing notes.  Pertinent labs & imaging results that were available during my care of the patient were reviewed by me and considered in my medical decision making (see chart for details).  MDM Reviewed: previous chart, nursing note and vitals Reviewed previous: labs Interpretation: labs and x-ray Total time providing critical care: 30-74 minutes. This excludes time spent performing separately reportable procedures and services. Consults: admitting MD   CRITICAL CARE Performed by: Kristin Wells Total critical care time: 35 minutes Critical care time was exclusive of separately billable procedures and treating other patients. Critical care was necessary to treat or prevent imminent or life-threatening deterioration. Critical care was time spent personally by me on the following activities: development of treatment plan with patient and/or surrogate as well as nursing, discussions with consultants, evaluation of patient's response to treatment, examination of patient, obtaining history from patient or surrogate, ordering and  performing treatments and interventions, ordering and review of laboratory studies, ordering and review of radiographic studies, pulse oximetry and re-evaluation of patient's condition.  Results for orders placed or performed during the hospital encounter of 78/24/23  Basic metabolic panel  Result Value Ref Range   Sodium 142 135 - 145 mmol/L   Potassium  4.7 3.5 - 5.1 mmol/L   Chloride 101 101 - 111 mmol/L   CO2 29 22 - 32 mmol/L   Glucose, Bld 89 65 - 99 mg/dL   BUN 14 6 - 20 mg/dL   Creatinine, Ser 0.91 0.44 - 1.00 mg/dL   Calcium 9.9 8.9 - 10.3 mg/dL   GFR calc non Af Amer >60 >60 mL/min   GFR calc Af Amer >60 >60 mL/min   Anion gap 12 5 - 15  CBC with Differential/Platelet  Result Value Ref Range   WBC 8.8 4.0 - 10.5 K/uL   RBC 5.46 (H) 3.87 - 5.11 MIL/uL   Hemoglobin 15.9 (H) 12.0 - 15.0 g/dL   HCT 47.7 (H) 36.0 - 46.0 %   MCV 87.4 78.0 - 100.0 fL   MCH 29.1 26.0 - 34.0 pg   MCHC 33.3 30.0 - 36.0 g/dL   RDW 14.1 11.5 - 15.5 %   Platelets 311 150 - 400 K/uL   Neutrophils Relative % 59 %   Neutro Abs 5.3 1.7 - 7.7 K/uL   Lymphocytes Relative 28 %   Lymphs Abs 2.5 0.7 - 4.0 K/uL   Monocytes Relative 7 %   Monocytes Absolute 0.6 0.1 - 1.0 K/uL   Eosinophils Relative 5 %   Eosinophils Absolute 0.4 0.0 - 0.7 K/uL   Basophils Relative 1 %   Basophils Absolute 0.0 0.0 - 0.1 K/uL   Dg Chest 2 View Result Date: 01/30/2017 CLINICAL DATA:  Patient with shortness of breath and wheezing. EXAM: CHEST  2 VIEW COMPARISON:  Chest radiograph 02/13/2016 FINDINGS: Normal cardiac and mediastinal contours. Tortuosity of the thoracic aorta. Aortic atherosclerosis. No consolidative pulmonary opacities. No pleural effusion or pneumothorax. Thoracic spine degenerative changes. IMPRESSION: No acute cardiopulmonary process. Electronically Signed   By: Lovey Newcomer M.D.   On: 01/30/2017 13:04   2045:  On arrival: pt sitting upright, tachypneic, tachycardic, Sats 91 % on her usual O2 N/C, lungs  diminished with wheezing. Steroid and hour long neb started. After neb: pt appears more comfortable at rest, less tachypneic, Sats 96-100 % on O2 2L N/C, lungs continue diminished. Pt ambulated in hallway: pt's O2 Sats dropped to 89 % on O2 N/C with pt c/o increasing SOB, with increasing HR and RR. Pt escorted back to stretcher with O2 Sats slowly increasing to 97 % on O2 2L N/C. Dx and testing d/w pt.  Questions answered.  Verb understanding, agreeable to admit. T/C to [facility] Dr. Darrick Meigs, case discussed, including:  HPI, pertinent PM/SHx, VS/PE, dx testing, ED course and treatment:  Agreeable to admit.     Final Clinical Impressions(s) / ED Diagnoses   Final diagnoses:  Wheezing    New Prescriptions New Prescriptions   No medications on file     Francine Graven, DO 02/01/17 1601

## 2017-01-30 NOTE — H&P (Signed)
[facility] H&P    Patient Demographics:    Kristin Wells, is a 66 y.o. female  MRN: [id number]  DOB - [birth year]  Admit Date - 01/30/2017  Referring MD/NP/PA: Dr. Thurnell Garbe  Outpatient Primary MD for the patient is Raylene Everts, MD  Patient coming from: Home  Chief Complaint  Patient presents with  . Asthma      HPI:    Kristin Wells  is a 66 y.o. female, With history of COPD on chronic home O2, diabetes mellitus, hypertension, ischemic cardiac myopathy, chronic systolic CHF, depression who came to hospital with worsening shortness of breath and coughing for past 2 days. Patient says that she used MDI nebulizers without relief at home. Denies chest pain, complains of nausea but no vomiting. No diarrhea. Denies fever or chills. She denies coughing up any phlegm.  In the ED patient was given continuous nebulizers with prednisone 60 mg, her breathing improved but on ablation O2 sats dropped to 89% on oxygen , [facility] hospitalist was consulted for admission.  She denies dysuria. No abdominal pain. No headache or blurred vision.   Review of systems:    In addition to the HPI above,    All other systems reviewed and are negative.   With Past History of the following :    Past Medical History:  Diagnosis Date  . Acute respiratory failure ([place]) 06/2011   Vent dependent sec to COPD/PNA  . Anxiety   . Arthritis   . Back pain   . Bronchitis   . CHF (congestive heart failure) ([place])   . COPD (chronic obstructive pulmonary disease) ([place])   . Coronary artery disease   . Demand ischemia of myocardium 06/28/2011  . Depression   . Diabetes mellitus without complication ([place])   . Hyperlipidemia   . Hypertension   . Ischemic cardiomyopathy 06/28/2011   EF 25%. Myoview stress test later showed ejection fraction of 65%  . MI, acute, non ST segment elevation ([place]) 06/29/2011   Myoview stress test revealed mild  perfusion defect  . Myocardial infarction ([place])   . Oxygen deficiency   . PNA (pneumonia) 06/29/2011  . Pneumonia   . Rectocele 10/09/2012  . Substance abuse    MANY YEARS AGO  . Thyroid enlargement 06/28/2011      Past Surgical History:  Procedure Laterality Date  . COLONOSCOPY  Feb 2013   Dr. Michail Sermon: internal hemorrhoids, normal view of ileum, multiple hyperplastic polyps  . ESOPHAGOGASTRODUODENOSCOPY  Feb 2013   Dr. Michail Sermon: normal duodenum, candida esophagitis, reactive gastropathy  . TUBAL LIGATION        Social History:      Social History  Substance Use Topics  . Smoking status: Current Every Day Smoker    Packs/day: 0.50    Years: 49.00    Types: Cigarettes    Start date: 05/10/1967  . Smokeless tobacco: Never Used     Comment: START 1969  . Alcohol use No       Family History :     Family History  Problem Relation  Age of Onset  . Diabetes Mother   . Hypertension Mother   . Diabetes Father   . Hypertension Father   . Heart disease Father        HEART FAILURE  . Skin cancer Brother        Unkonwn type  . Alcohol abuse Brother   . Seizures Sister   . Uterine cancer Sister   . Early death Sister        Pneumonia at 26 months old  . Early death Son   . Drug abuse Son   . Heart attack Son   . Cancer Maternal Uncle   . Cirrhosis Brother   . Colon cancer Neg Hx       Home Medications:   Prior to Admission medications   Medication Sig Start Date End Date Taking? Authorizing Provider  Aclidinium Bromide (TUDORZA PRESSAIR) 400 MCG/ACT AEPB Inhale 2 puffs into the lungs 2 (two) times daily as needed.    Yes [provider]  albuterol (PROVENTIL HFA;VENTOLIN HFA) 108 (90 BASE) MCG/ACT inhaler Inhale 2 puffs into the lungs every 6 (six) hours as needed for wheezing or shortness of breath.   Yes [provider]  albuterol (PROVENTIL) (2.5 MG/3ML) 0.083% nebulizer solution Take 2.5 mg by nebulization every 6 (six) hours as needed for  wheezing or shortness of breath.  07/14/11  Yes Kathie Dike, MD  aspirin EC 81 MG tablet Take 81 mg by mouth daily.   Yes [provider]  cyclobenzaprine (FLEXERIL) 10 MG tablet Take 10 mg by mouth 2 (two) times daily.   Yes [provider]  fluticasone furoate-vilanterol (BREO ELLIPTA) 100-25 MCG/INH AEPB Inhale 1 puff into the lungs daily.   Yes [provider]  furosemide (LASIX) 20 MG tablet Take 1 tablet (20 mg total) by mouth daily. 01/10/14  Yes Lendon Colonel, NP  levothyroxine (SYNTHROID, LEVOTHROID) 25 MCG tablet Take 25 mcg by mouth daily. 08/29/13  Yes [provider]  metFORMIN (GLUCOPHAGE) 500 MG tablet Take 500 mg by mouth 2 (two) times daily with a meal.  02/22/13  Yes [provider]  OXYGEN Inhale 2 L into the lungs daily as needed.   Yes [provider]  pantoprazole (PROTONIX) 40 MG tablet Take 40 mg by mouth daily.   Yes [provider]  potassium chloride (K-DUR) 10 MEQ tablet Take 1 tablet (10 mEq total) by mouth daily. 08/22/16  Yes Raylene Everts, MD  rosuvastatin (CRESTOR) 10 MG tablet Take 10 mg by mouth daily at 6 PM. 02/26/13  Yes Lendon Colonel, NP  DULoxetine (CYMBALTA) 20 MG capsule TAKE 1 CAPSULE BY MOUTH EVERY DAY. Patient not taking: Reported on 01/30/2017 11/11/16   Raylene Everts, MD     Allergies:    No Known Allergies   Physical Exam:   Vitals  Blood pressure 113/73, pulse (!) 112, temperature 98.2 F (36.8 C), temperature source Oral, resp. rate (!) 21, height 5\' 2"  (1.575 m), weight 59.9 kg (132 lb), SpO2 94 %.  1.  General: Appears in no acute distress  2. Psychiatric:  Intact judgement and  insight, awake alert, oriented x 3.  3. Neurologic: No focal neurological deficits, all cranial nerves intact.Strength 5/5 all 4 extremities, sensation intact all 4 extremities, plantars down going.  4. Eyes :  anicteric sclerae, moist conjunctivae with no lid lag.  PERRLA.  5. ENMT:  Oropharynx clear with moist mucous membranes and good dentition  6. Neck:  supple,  no cervical lymphadenopathy appriciated, No thyromegaly  7. Respiratory : Normal respiratory effort, good air movement bilaterally, scattered wheezing bilaterally  8. Cardiovascular : RRR, no gallops, rubs or murmurs, no leg edema  9. Gastrointestinal:  Positive bowel sounds, abdomen soft, non-tender to palpation,no hepatosplenomegaly, no rigidity or guarding       10. Skin:  No cyanosis, normal texture and turgor, no rash, lesions or ulcers  11.Musculoskeletal:  Good muscle tone,  joints appear normal , no effusions,  normal range of motion    Data Review:    CBC  Recent Labs Lab 01/30/17 1402  WBC 8.8  HGB 15.9*  HCT 47.7*  PLT 311  MCV 87.4  MCH 29.1  MCHC 33.3  RDW 14.1  LYMPHSABS 2.5  MONOABS 0.6  EOSABS 0.4  BASOSABS 0.0   ------------------------------------------------------------------------------------------------------------------  Chemistries   Recent Labs Lab 01/30/17 1402  NA 142  K 4.7  CL 101  CO2 29  GLUCOSE 89  BUN 14  CREATININE 0.91  CALCIUM 9.9   ------------------------------------------------------------------------------------------------------------------  ------------------------------------------------------------------------------------------------------------------ CBG:  Recent Labs Lab 01/30/17 2038  GLUCAP 251*   Lipid Profile: No results for input(s): CHOL, HDL, LDLCALC, TRIG, CHOLHDL, LDLDIRECT in the last 72 hours. Thyroid Function Tests: No results for input(s): TSH, T4TOTAL, FREET4, T3FREE, THYROIDAB in the last 72 hours. Anemia Panel: No results for input(s): VITAMINB12, FOLATE, FERRITIN, TIBC, IRON, RETICCTPCT in the last 72 hours.  --------------------------------------------------------------------------------------------------------------- Urine analysis:    Component Value Date/Time   COLORURINE  YELLOW 08/19/2016 1435   APPEARANCEUR CLEAR 08/19/2016 1435   LABSPEC 1.010 08/19/2016 1435   PHURINE 5.5 08/19/2016 1435   GLUCOSEU NEGATIVE 08/19/2016 1435   HGBUR TRACE (A) 08/19/2016 1435   BILIRUBINUR NEGATIVE 08/19/2016 1435   KETONESUR NEGATIVE 08/19/2016 1435   PROTEINUR NEGATIVE 08/19/2016 1435   UROBILINOGEN 0.2 08/30/2013 1500   NITRITE NEGATIVE 08/19/2016 1435   LEUKOCYTESUR NEGATIVE 08/19/2016 1435      Imaging Results:    Dg Chest 2 View  Result Date: 01/30/2017 CLINICAL DATA:  Patient with shortness of breath and wheezing. EXAM: CHEST  2 VIEW COMPARISON:  Chest radiograph 02/13/2016 FINDINGS: Normal cardiac and mediastinal contours. Tortuosity of the thoracic aorta. Aortic atherosclerosis. No consolidative pulmonary opacities. No pleural effusion or pneumothorax. Thoracic spine degenerative changes. IMPRESSION: No acute cardiopulmonary process. Electronically Signed   By: Lovey Newcomer M.D.   On: 01/30/2017 13:04       Assessment & Plan:    Active Problems:   HTN (hypertension)   Tobacco abuse   CAD (coronary artery disease)   Systolic CHF, chronic (HCC)   COPD exacerbation ([place])   1. COPD exacerbation-place under observation, patient received prednisone 60 mg the ED. Will start prednisone 50 mg from tomorrow and which can be tapered over next few days. Continue DuoNeb nebs every 6 hours. Mucinex 1 tablet by mouth twice a day 2. Diabetes mellitus-hold metformin, will start sliding scale insulin with NovoLog. 3. Chronic systolic CHF-currently euvolemic, continue Lasix 20 milli grams by mouth daily 4. CAD-stable, continue aspirin, Crestor 5. Depression-patient says that she does not take Cymbalta at home. Will not ORDER Cymbalta at this time. 6. Hypothyroidism-continue Synthroid.   DVT Prophylaxis-   Lovenox   AM Labs Ordered, also please review Full Orders  Family Communication: Admission, patients condition and plan of care including tests being ordered have  been discussed with the patient  who indicate understanding and agree with the plan and Code Status.  Code Status:  DO NOT RESUSCITATE  Admission status: Observation    Time spent in minutes : 60 minutes   LAMA,GAGAN S M.D on 01/30/2017 at 9:42 PM  Between 7am to 7pm - Pager - [phone]. After 7pm go to [email or web address] - password [facility]  [facility] - Office  [phone]

## 2017-01-30 NOTE — ED Triage Notes (Addendum)
Patient c/o wheezing with shortness of breath. Patient started on Saturday and progressively getting worse. Hx of bronchitis and asthma. Per patient using inhalers, home breathing treatments, and O2 at home with no relief. Denies any fevers. Patient has labored breath with wheezing in triage. O2 sat 95%. Patient states that asthma was "triggered by household cleaning products."

## 2017-01-31 DIAGNOSIS — J441 Chronic obstructive pulmonary disease with (acute) exacerbation: Secondary | ICD-10-CM | POA: Diagnosis not present

## 2017-01-31 DIAGNOSIS — I5022 Chronic systolic (congestive) heart failure: Secondary | ICD-10-CM | POA: Diagnosis not present

## 2017-01-31 DIAGNOSIS — J449 Chronic obstructive pulmonary disease, unspecified: Secondary | ICD-10-CM | POA: Diagnosis not present

## 2017-01-31 DIAGNOSIS — J9611 Chronic respiratory failure with hypoxia: Secondary | ICD-10-CM

## 2017-01-31 DIAGNOSIS — I10 Essential (primary) hypertension: Secondary | ICD-10-CM | POA: Diagnosis not present

## 2017-01-31 DIAGNOSIS — I2510 Atherosclerotic heart disease of native coronary artery without angina pectoris: Secondary | ICD-10-CM | POA: Diagnosis not present

## 2017-01-31 LAB — COMPREHENSIVE METABOLIC PANEL
ALT: 11 U/L — ABNORMAL LOW (ref 14–54)
AST: 12 U/L — ABNORMAL LOW (ref 15–41)
Albumin: 3.8 g/dL (ref 3.5–5.0)
Alkaline Phosphatase: 66 U/L (ref 38–126)
Anion gap: 10 (ref 5–15)
BUN: 20 mg/dL (ref 6–20)
CO2: 28 mmol/L (ref 22–32)
Calcium: 9.4 mg/dL (ref 8.9–10.3)
Chloride: 101 mmol/L (ref 101–111)
Creatinine, Ser: 0.97 mg/dL (ref 0.44–1.00)
GFR calc Af Amer: >60 mL/min (ref >60)
GFR calc non Af Amer: 60 mL/min — ABNORMAL LOW (ref >60)
Glucose, Bld: 137 mg/dL — ABNORMAL HIGH (ref 65–99)
Potassium: 4.4 mmol/L (ref 3.5–5.1)
Sodium: 139 mmol/L (ref 135–145)
Total Bilirubin: 0.7 mg/dL (ref 0.3–1.2)
Total Protein: 7 g/dL (ref 6.5–8.1)

## 2017-01-31 LAB — CBC
HCT: 42.7 % (ref 36.0–46.0)
Hemoglobin: 14.1 g/dL (ref 12.0–15.0)
MCH: 28.4 pg (ref 26.0–34.0)
MCHC: 33 g/dL (ref 30.0–36.0)
MCV: 86.1 fL (ref 78.0–100.0)
Platelets: 254 10*3/uL (ref 150–400)
RBC: 4.96 MIL/uL (ref 3.87–5.11)
RDW: 13.9 % (ref 11.5–15.5)
WBC: 7.2 10*3/uL (ref 4.0–10.5)

## 2017-01-31 LAB — GLUCOSE, CAPILLARY
Glucose-Capillary: 106 mg/dL — ABNORMAL HIGH (ref 65–99)
Glucose-Capillary: 120 mg/dL — ABNORMAL HIGH (ref 65–99)
Glucose-Capillary: 191 mg/dL — ABNORMAL HIGH (ref 65–99)

## 2017-01-31 LAB — HEMOGLOBIN A1C
Hgb A1c MFr Bld: 5.8 % — ABNORMAL HIGH (ref 4.8–5.6)
Mean Plasma Glucose: 119.76 mg/dL

## 2017-01-31 MED ORDER — FLUTICASONE FUROATE-VILANTEROL 100-25 MCG/INH IN AEPB: 1.0000 | INHALATION_SPRAY | Freq: Every day | RESPIRATORY_TRACT | 1 refills | Status: DC

## 2017-01-31 MED ORDER — AZITHROMYCIN 250 MG PO TABS: ORAL_TABLET | ORAL | 0 refills | Status: DC

## 2017-01-31 MED ORDER — PREDNISONE 10 MG PO TABS: ORAL_TABLET | ORAL | 0 refills | Status: DC

## 2017-01-31 MED ORDER — ALBUTEROL SULFATE HFA 108 (90 BASE) MCG/ACT IN AERS: 2.0000 | INHALATION_SPRAY | Freq: Four times a day (QID) | RESPIRATORY_TRACT | 1 refills | Status: DC | PRN

## 2017-01-31 MED ORDER — ORAL CARE MOUTH RINSE
15.0000 mL | Freq: Two times a day (BID) | OROMUCOSAL | Status: DC
  Administered 2017-01-31 (×2): 15 mL via OROMUCOSAL

## 2017-01-31 MED ORDER — GUAIFENESIN ER 600 MG PO TB12: 600.0000 mg | ORAL_TABLET | Freq: Two times a day (BID) | ORAL | 0 refills | Status: AC

## 2017-01-31 MED ORDER — IPRATROPIUM-ALBUTEROL 0.5-2.5 (3) MG/3ML IN SOLN
3.0000 mL | Freq: Four times a day (QID) | RESPIRATORY_TRACT | Status: DC
  Administered 2017-01-31 (×3): 3 mL via RESPIRATORY_TRACT
  Filled 2017-01-31 (×3): qty 3

## 2017-01-31 MED ORDER — ALBUTEROL SULFATE (2.5 MG/3ML) 0.083% IN NEBU: 2.5000 mg | INHALATION_SOLUTION | Freq: Four times a day (QID) | RESPIRATORY_TRACT | 12 refills | Status: DC | PRN

## 2017-01-31 NOTE — Discharge Summary (Signed)
Physician Discharge Summary  Kristin Wells DPO:[id number] DOB: 12-03-1950 DOA: 01/30/2017  PCP: Raylene Everts, MD  Admit date: 01/30/2017 Discharge date: 01/31/2017  Admitted From: home Disposition:  home  Recommendations for Outpatient Follow-up:  1. Follow up with PCP in 1-2 weeks   Home Health: Equipment/Devices:  Discharge Condition: stable CODE STATUS: DNR Diet recommendation: Heart Healthy   Brief/Interim Summary: 66 year old female with oxygen dependent COPD and chronic respiratory failure, admitted to the hospital with shortness of breath and COPD exacerbation. She was treated with nebulizer treatments, prednisone, Mucinex. The following day her shortness of breath had improved. Her wheezing has resolved. She feels that her breathing is back to baseline. She'll be continued on prednisone taper, continued on bronchodilators at home. We'll give her a course of azithromycin. She plans to follow-up with her primary care physician. She is otherwise in stable condition. The remainder of her medical problems are stable.  Discharge Diagnoses:  Active Problems:   HTN (hypertension)   Tobacco abuse   CAD (coronary artery disease)   Systolic CHF, chronic (HCC)   Chronic respiratory failure with hypoxia (HCC)   COPD exacerbation [facility])    Discharge Instructions  Discharge Instructions    AMB Referral to [place]    Complete by:  As directed    Reason for consult:  post hospital discharge follow up with RN telephonic case manager and [facility] Pharmacist   Diagnoses of:  COPD/ Pneumonia   Expected date of contact:  1-3 days (reserved for hospital discharges)   Please assign patient for [facility] telephonic nurse to engage for transition of care calls post hospital discharge. Please also assign [facility] Pharmacist per patient request for medication questions and instructions. For questions please contact:   Janci Minor RN, [facility] Liaison [phone])   Diet - low  sodium heart healthy    Complete by:  As directed    Increase activity slowly    Complete by:  As directed      Allergies as of 01/31/2017   No Known Allergies     Medication List    TAKE these medications   albuterol 108 (90 Base) MCG/ACT inhaler Commonly known as:  PROVENTIL HFA;VENTOLIN HFA Inhale 2 puffs into the lungs every 6 (six) hours as needed for wheezing or shortness of breath.   albuterol (2.5 MG/3ML) 0.083% nebulizer solution Commonly known as:  PROVENTIL Take 3 mLs (2.5 mg total) by nebulization every 6 (six) hours as needed for wheezing or shortness of breath.   aspirin EC 81 MG tablet Take 81 mg by mouth daily.   azithromycin 250 MG tablet Commonly known as:  ZITHROMAX Z-PAK Take 2 tabs po daily for 1 day then take 1 tab po daily   cyclobenzaprine 10 MG tablet Commonly known as:  FLEXERIL Take 10 mg by mouth 2 (two) times daily.   DULoxetine 20 MG capsule Commonly known as:  CYMBALTA TAKE 1 CAPSULE BY MOUTH EVERY DAY.   fluticasone furoate-vilanterol 100-25 MCG/INH Aepb Commonly known as:  BREO ELLIPTA Inhale 1 puff into the lungs daily.   furosemide 20 MG tablet Commonly known as:  LASIX Take 1 tablet (20 mg total) by mouth daily.   guaiFENesin 600 MG 12 hr tablet Commonly known as:  MUCINEX Take 1 tablet (600 mg total) by mouth 2 (two) times daily.   levothyroxine 25 MCG tablet Commonly known as:  SYNTHROID, LEVOTHROID Take 25 mcg by mouth daily.   metFORMIN 500 MG tablet Commonly known  as:  GLUCOPHAGE Take 500 mg by mouth 2 (two) times daily with a meal.   OXYGEN Inhale 2 L into the lungs daily as needed.   pantoprazole 40 MG tablet Commonly known as:  PROTONIX Take 40 mg by mouth daily.   potassium chloride 10 MEQ tablet Commonly known as:  K-DUR Take 1 tablet (10 mEq total) by mouth daily.   predniSONE 10 MG tablet Commonly known as:  DELTASONE Take 40mg  po daily for 2 days then 30mg  daily for 2 days then 20mg  daily for 2 days  then 10mg  daily for 2 days then stop   rosuvastatin 10 MG tablet Commonly known as:  CRESTOR Take 10 mg by mouth daily at 6 PM.   TUDORZA PRESSAIR 400 MCG/ACT Aepb Generic drug:  Aclidinium Bromide Inhale 2 puffs into the lungs 2 (two) times daily as needed.            Discharge Care Instructions        Start     Ordered   01/31/17 0000  AMB Referral to [place] [facility]    Question Answer Comment  Reason for consult post hospital discharge follow up with RN telephonic case manager and [facility] Pharmacist   Diagnoses of COPD/ Pneumonia   Expected date of contact 1-3 days (reserved for hospital discharges)      01/31/17 1414   01/31/17 0000  albuterol (PROVENTIL HFA;VENTOLIN HFA) 108 (90 Base) MCG/ACT inhaler  Every 6 hours PRN     01/31/17 1633   01/31/17 0000  albuterol (PROVENTIL) (2.5 MG/3ML) 0.083% nebulizer solution  Every 6 hours PRN     01/31/17 1633   01/31/17 0000  fluticasone furoate-vilanterol (BREO ELLIPTA) 100-25 MCG/INH AEPB  Daily     01/31/17 1633   01/31/17 0000  guaiFENesin (MUCINEX) 600 MG 12 hr tablet  2 times daily     01/31/17 1633   01/31/17 0000  predniSONE (DELTASONE) 10 MG tablet     01/31/17 1633   01/31/17 0000  azithromycin (ZITHROMAX Z-PAK) 250 MG tablet     01/31/17 1633   01/31/17 0000  Increase activity slowly     01/31/17 1633   01/31/17 0000  Diet - low sodium heart healthy     01/31/17 1633     Follow-up Information    Raylene Everts, MD. Schedule an appointment as soon as possible for a visit in 2 week(s).   Specialty:  Family Medicine Contact information: [address] [place] [address] [phone]          No Known Allergies  Consultations:     Procedures/Studies: Dg Chest 2 View  Result Date: 01/30/2017 CLINICAL DATA:  Patient with shortness of breath and wheezing. EXAM: CHEST  2 VIEW COMPARISON:  Chest radiograph 02/13/2016 FINDINGS: Normal cardiac and mediastinal contours. Tortuosity of the  thoracic aorta. Aortic atherosclerosis. No consolidative pulmonary opacities. No pleural effusion or pneumothorax. Thoracic spine degenerative changes. IMPRESSION: No acute cardiopulmonary process. Electronically Signed   By: Lovey Newcomer M.D.   On: 01/30/2017 13:04       Subjective: Feeling better. Wheezing resolved.  Discharge Exam: Vitals:   01/31/17 1402 01/31/17 1456  BP: (!) 98/59   Pulse: 98   Resp: 20   Temp: 98.1 F (36.7 C)   SpO2: 98% 94%   Vitals:   01/31/17 0758 01/31/17 0807 01/31/17 1402 01/31/17 1456  BP:   (!) 98/59   Pulse:   98   Resp:  20   Temp:   98.1 F (36.7 C)   TempSrc:      SpO2: 98% 99% 98% 94%  Weight:      Height:        General: Pt is alert, awake, not in acute distress Cardiovascular: RRR, S1/S2 +, no rubs, no gallops Respiratory: CTA bilaterally, no wheezing, no rhonchi Abdominal: Soft, NT, ND, bowel sounds + Extremities: no edema, no cyanosis    The results of significant diagnostics from this hospitalization (including imaging, microbiology, ancillary and laboratory) are listed below for reference.     Microbiology: No results found for this or any previous visit (from the past 240 hour(s)).   Labs: BNP (last 3 results)  Recent Labs  02/13/16 1327  BNP 59.5   Basic Metabolic Panel:  Recent Labs Lab 01/30/17 1402 01/31/17 0457  NA 142 139  K 4.7 4.4  CL 101 101  CO2 29 28  GLUCOSE 89 137*  BUN 14 20  CREATININE 0.91 0.97  CALCIUM 9.9 9.4   Liver Function Tests:  Recent Labs Lab 01/31/17 0457  AST 12*  ALT 11*  ALKPHOS 66  BILITOT 0.7  PROT 7.0  ALBUMIN 3.8   No results for input(s): LIPASE, AMYLASE in the last 168 hours. No results for input(s): AMMONIA in the last 168 hours. CBC:  Recent Labs Lab 01/30/17 1402 01/31/17 0457  WBC 8.8 7.2  NEUTROABS 5.3  --   HGB 15.9* 14.1  HCT 47.7* 42.7  MCV 87.4 86.1  PLT 311 254   Cardiac Enzymes: No results for input(s): CKTOTAL, CKMB, CKMBINDEX,  TROPONINI in the last 168 hours. BNP: Invalid input(s): POCBNP CBG:  Recent Labs Lab 01/30/17 2038 01/31/17 0748 01/31/17 1132 01/31/17 1632  GLUCAP 251* 106* 120* 191*   D-Dimer No results for input(s): DDIMER in the last 72 hours. Hgb A1c  Recent Labs  01/31/17 0457  HGBA1C 5.8*   Lipid Profile No results for input(s): CHOL, HDL, LDLCALC, TRIG, CHOLHDL, LDLDIRECT in the last 72 hours. Thyroid function studies No results for input(s): TSH, T4TOTAL, T3FREE, THYROIDAB in the last 72 hours.  Invalid input(s): FREET3 Anemia work up No results for input(s): VITAMINB12, FOLATE, FERRITIN, TIBC, IRON, RETICCTPCT in the last 72 hours. Urinalysis    Component Value Date/Time   COLORURINE YELLOW 08/19/2016 1435   APPEARANCEUR CLEAR 08/19/2016 1435   LABSPEC 1.010 08/19/2016 1435   PHURINE 5.5 08/19/2016 1435   GLUCOSEU NEGATIVE 08/19/2016 1435   HGBUR TRACE (A) 08/19/2016 1435   BILIRUBINUR NEGATIVE 08/19/2016 1435   KETONESUR NEGATIVE 08/19/2016 1435   PROTEINUR NEGATIVE 08/19/2016 1435   UROBILINOGEN 0.2 08/30/2013 1500   NITRITE NEGATIVE 08/19/2016 1435   LEUKOCYTESUR NEGATIVE 08/19/2016 1435   Sepsis Labs Invalid input(s): PROCALCITONIN,  WBC,  LACTICIDVEN Microbiology No results found for this or any previous visit (from the past 240 hour(s)).   Time coordinating discharge: Over 30 minutes  SIGNED:   Kathie Dike, MD  [facility] 01/31/2017, 4:35 PM Pager   If 7PM-7AM, please contact night-coverage [email or web address] Password [email or web address]

## 2017-01-31 NOTE — Progress Notes (Signed)
Pt left unit accompanied by [facility] transport disposition home.

## 2017-01-31 NOTE — Care Management Note (Signed)
Case Management Note  Patient Details  Name: Kristin Wells MRN: [id number] Date of Birth: [birth year]  Subjective/Objective: Patient adm with COPD exacerbation. From home alone, has aid 3 hours a day M-F, none on weekends. She has rollator, cane, neb machine and continuous oxygen setup at home. She uses oxygen as needed. Active in the community. Has been active with [facility] in the past, reports they helped her tremendously. Will enroll in EMMI transition calls. Has PCP, aid drives her to appointments, reports no issues affording medications.                   Action/Plan: Anticipate DC home with self care.    Expected Discharge Date:  01/31/17               Expected Discharge Plan:  Home/Self Care  In-House Referral:     Discharge planning Services  CM Consult  Post Acute Care Choice:  NA Choice offered to:  NA  DME Arranged:    DME Agency:     HH Arranged:    HH Agency:     Status of Service:  Completed, signed off  If discussed at [employer] of Stay Meetings, dates discussed:    Additional Comments:  Kristin Wells, Chauncey Reading, RN 01/31/2017, 10:49 AM

## 2017-01-31 NOTE — Consult Note (Signed)
   [facility]patient Consult   01/31/2017  Kristin Wells 08/06/1950 [id number]   Chart review revealed patient eligible for [facility] Management services and post hospital discharge follow up related to a diagnosis of COPD. Patient was evaluated for telephonic based chronic disease management services with [facility] as a benefit of patient's [employer]. Met with the patient at the bedside to explain [facility] services. Patient stated she has had [facility] in the past and the services were extremely helpful.  Patient endorses her/his primary care provider to be Dr. Meda Coffee. Patient states she was previously receiving care by a CNA but has had an issue with bedbugs in her apartment which caused her to put this care on hold. Consent form signed. Patient gave [phone] as the best number to reach her. She stated she had no one to write down as an emergency contact .Patient will receive post hospital discharge calls from RN telephonic case manager. Also, per patient's request [facility] pharmacist will call and do medication review and education. [facility] Care Management services does not interfere with or replace any services arranged by the inpatient care management team. RNCM left contact information and [facility] literature at the bedside. Made inpatient RNCM aware that [facility] will be following for care management. For additional questions please contact:   Trenace Coughlin RN, [facility] Liaison  [phone]) Business Mobile [phone]) Toll free office

## 2017-02-01 LAB — HIV ANTIBODY (ROUTINE TESTING W REFLEX): HIV Screen 4th Generation wRfx: NONREACTIVE

## 2017-02-02 ENCOUNTER — Other Ambulatory Visit: Payer: Self-pay | Admitting: *Deleted

## 2017-02-02 ENCOUNTER — Encounter: Payer: Self-pay | Admitting: *Deleted

## 2017-02-02 NOTE — Patient Outreach (Signed)
[facility]) Care Management  [place]  02/03/2017   Kristin Wells 01/11/1951 [id number]   Referral via [facility] hospital liaison for transition of care.  Subjective:  Telephone call to patient 09/27.  HIPPA verification received.  Voices that she lives alone but has assistance from CNA several hours a day Mon-Friday.. Voices that she has been trying to call MD office but is unable to speak to anyone yet. Voices that she has all of medicines but having some trouble with bubble pack because writing is small & color of medications changes from time to time. States she prefers to take medication from bottles.  States she feels anxious because she has so much medicine and wants to make sure she is taking it right. States she only has a few days of antibiotic & will be finished taking it.  Voices that she is not wheezing today & uses oxygen as needed. States she does have rescue inhaler if needed. States she is aware of COPD action plan.   Objective:  Hospital encounter from 09/24-09/25/2018. Dx: COPD exacerbation  Other dxs: HTN, oxygen dependent-COPD, Chronic respiratory failure, CAD, Chronic HF, Tobacco abuse  Encounter Medications:  Outpatient Encounter Prescriptions as of 02/02/2017  Medication Sig  . Aclidinium Bromide (TUDORZA PRESSAIR) 400 MCG/ACT AEPB Inhale 2 puffs into the lungs 2 (two) times daily as needed.   Marland Kitchen albuterol (PROVENTIL HFA;VENTOLIN HFA) 108 (90 Base) MCG/ACT inhaler Inhale 2 puffs into the lungs every 6 (six) hours as needed for wheezing or shortness of breath.  Marland Kitchen albuterol (PROVENTIL) (2.5 MG/3ML) 0.083% nebulizer solution Take 3 mLs (2.5 mg total) by nebulization every 6 (six) hours as needed for wheezing or shortness of breath.  Marland Kitchen aspirin EC 81 MG tablet Take 81 mg by mouth daily.  Marland Kitchen azithromycin (ZITHROMAX Z-PAK) 250 MG tablet Take 2 tabs po daily for 1 day then take 1 tab po daily  . cyclobenzaprine (FLEXERIL) 10 MG tablet Take 10 mg by  mouth 2 (two) times daily.  . fluticasone furoate-vilanterol (BREO ELLIPTA) 100-25 MCG/INH AEPB Inhale 1 puff into the lungs daily.  . furosemide (LASIX) 20 MG tablet Take 1 tablet (20 mg total) by mouth daily.  Marland Kitchen levothyroxine (SYNTHROID, LEVOTHROID) 25 MCG tablet Take 25 mcg by mouth daily.  . metFORMIN (GLUCOPHAGE) 500 MG tablet Take 500 mg by mouth 2 (two) times daily with a meal.   . OXYGEN Inhale 2 L into the lungs daily as needed.  . pantoprazole (PROTONIX) 40 MG tablet Take 40 mg by mouth daily.  . potassium chloride (K-DUR) 10 MEQ tablet Take 1 tablet (10 mEq total) by mouth daily.  . predniSONE (DELTASONE) 10 MG tablet Take 40mg  po daily for 2 days then 30mg  daily for 2 days then 20mg  daily for 2 days then 10mg  daily for 2 days then stop  . rosuvastatin (CRESTOR) 10 MG tablet Take 10 mg by mouth daily at 6 PM.  . DULoxetine (CYMBALTA) 20 MG capsule TAKE 1 CAPSULE BY MOUTH EVERY DAY. (Patient not taking: Reported on 01/30/2017)  . guaiFENesin (MUCINEX) 600 MG 12 hr tablet Take 1 tablet (600 mg total) by mouth 2 (two) times daily. (Patient not taking: Reported on 02/02/2017)   No facility-administered encounter medications on file as of 02/02/2017.     Functional Status:  In your present state of health, do you have any difficulty performing the following activities: 02/02/2017 01/31/2017  Hearing? Y Y  Comment rt ear has decreased hearing -  Vision?  N N  Comment - -  Difficulty concentrating or making decisions? N N  Walking or climbing stairs? Y Y  Dressing or bathing? N N  Doing errands, shopping? [place] and eating ? - -  Using the Toilet? - -  In the past six months, have you accidently leaked urine? - -  Do you have problems with loss of bowel control? - -  Managing your Medications? - -  Managing your Finances? - -  Housekeeping or managing your Housekeeping? - -  Some recent data might be hidden    Fall/Depression Screening: Fall Risk   02/02/2017 09/28/2016 08/19/2016  Falls in the past year? No No Yes  Number falls in past yr: - - 2 or more  Injury with Fall? - - Yes   PHQ 2/9 Scores 02/02/2017 09/28/2016 08/19/2016  PHQ - 2 Score 0 5 0  PHQ- 9 Score - 7 -    Assessment:  Recent hospital encounter.  Patient agrees to [facility] care management services. Referral was made to Telephonic care coordinator & Pharmacist. Patient would benefit from weekly call for care coordination following recent hospital discharge.  Pt not secure using bubble pack dispensing of medications. Patient agrees to set appointment for follow up.  [facility] CM Care Plan Problem One     Most Recent Value  Care Plan Problem One  Recent hospital admission, at risk for readmission for COPD  Role Documenting the Problem One  Care Management Telephonic Coordinator  Care Plan for Problem One  Active  [facility] Long Term Goal   Avoid readmission within 2 wks of discharge   [facility] Long Term Goal Start Date  02/02/17  Interventions for Problem One Long Term Goal  Advise pt of importance of MD f/u, taking meds, & early reporting of sxs to MD  [facility] CM Short Term Goal #1   Pt will schedule hospital f/u within 14 days  [facility] CM Short Term Goal #1 Start Date  02/02/17  Interventions for Short Term Goal #1  Advise patient to call MD office to set appointment, help with contact info if needed  [facility] CM Short Term Goal #2   Patient will voice taking meds as prescribes within 14 days  [facility] CM Short Term Goal #2 Start Date  02/02/17  Interventions for Short Term Goal #2  Reviews medicine with patient, refer to pharmacist       Plan:  Will follow up weekly for several weeks. Send welcome packet to packet. Send MD involvement letter   02/02/2017  [name] 1951-04-28 [id number]   Sherrin Daisy, RN BSN [place] Management Coordinator [facility] Care Management  [phone]

## 2017-02-03 ENCOUNTER — Encounter: Payer: Self-pay | Admitting: *Deleted

## 2017-02-03 ENCOUNTER — Other Ambulatory Visit: Payer: Self-pay | Admitting: Pharmacist

## 2017-02-03 ENCOUNTER — Telehealth: Payer: Self-pay | Admitting: Family Medicine

## 2017-02-03 DIAGNOSIS — J9690 Respiratory failure, unspecified, unspecified whether with hypoxia or hypercapnia: Secondary | ICD-10-CM | POA: Diagnosis not present

## 2017-02-03 NOTE — Patient Outreach (Addendum)
[place] [facility]) Care Management  [place]   02/03/2017  Kristin Wells 1950-11-02 [id number]  Subjective:  Patient was referred to [place] Pharmacist by [facility] for patient reported medication questions.   Patient was admitted at [facility] 9/24-9/25/18 for shortness of breath/COPD exacerbation.  She has a past medication history which includes:  COPD, hypertension, diabetes (01/2017 A1c 5.8), depression, anxiety.    Patient answered call, purpose of call was explained to patient, HIPAA details verified, and she agreed to proceed with call.   Patient reports presently obtaining medications from [place] in bubble packaging and doesn't like this.  She reports she identifies her medications by color/shape.  She reports she has not contacted her pharmacy about other packaging options---reports she prefers [place].  She reports she has not followed up with PCP since discharge and does not have office phone number.    Objective:   Current Medications: Current Outpatient Prescriptions  Medication Sig Dispense Refill  . Aclidinium Bromide (TUDORZA PRESSAIR) 400 MCG/ACT AEPB Inhale 2 puffs into the lungs 2 (two) times daily as needed.     Marland Kitchen albuterol (PROVENTIL HFA;VENTOLIN HFA) 108 (90 Base) MCG/ACT inhaler Inhale 2 puffs into the lungs every 6 (six) hours as needed for wheezing or shortness of breath. 1 Inhaler 1  . albuterol (PROVENTIL) (2.5 MG/3ML) 0.083% nebulizer solution Take 3 mLs (2.5 mg total) by nebulization every 6 (six) hours as needed for wheezing or shortness of breath. 75 mL 12  . aspirin EC 81 MG tablet Take 81 mg by mouth daily.    Marland Kitchen azithromycin (ZITHROMAX Z-PAK) 250 MG tablet Take 2 tabs po daily for 1 day then take 1 tab po daily 6 each 0  . cyclobenzaprine (FLEXERIL) 10 MG tablet Take 10 mg by mouth 2 (two) times daily.    . DULoxetine (CYMBALTA) 20 MG capsule TAKE 1 CAPSULE BY MOUTH EVERY DAY. (Patient not  taking: Reported on 01/30/2017) 30 capsule 0  . fluticasone furoate-vilanterol (BREO ELLIPTA) 100-25 MCG/INH AEPB Inhale 1 puff into the lungs daily. 28 each 1  . furosemide (LASIX) 20 MG tablet Take 1 tablet (20 mg total) by mouth daily. 30 tablet 6  . guaiFENesin (MUCINEX) 600 MG 12 hr tablet Take 1 tablet (600 mg total) by mouth 2 (two) times daily. (Patient not taking: Reported on 02/02/2017) 30 tablet 0  . levothyroxine (SYNTHROID, LEVOTHROID) 25 MCG tablet Take 25 mcg by mouth daily.    . metFORMIN (GLUCOPHAGE) 500 MG tablet Take 500 mg by mouth 2 (two) times daily with a meal.     . OXYGEN Inhale 2 L into the lungs daily as needed.    . pantoprazole (PROTONIX) 40 MG tablet Take 40 mg by mouth daily.    . potassium chloride (K-DUR) 10 MEQ tablet Take 1 tablet (10 mEq total) by mouth daily. 90 tablet 3  . predniSONE (DELTASONE) 10 MG tablet Take 40mg  po daily for 2 days then 30mg  daily for 2 days then 20mg  daily for 2 days then 10mg  daily for 2 days then stop 20 tablet 0  . rosuvastatin (CRESTOR) 10 MG tablet Take 10 mg by mouth daily at 6 PM.     No current facility-administered medications for this visit.     Functional Status: In your present state of health, do you have any difficulty performing the following activities: 02/02/2017 01/31/2017  Hearing? Y Y  Comment rt ear has decreased hearing -  Vision? N  N  Comment - -  Difficulty concentrating or making decisions? N N  Walking or climbing stairs? Y Y  Dressing or bathing? N N  Doing errands, shopping? [place] and eating ? - -  Using the Toilet? - -  In the past six months, have you accidently leaked urine? - -  Do you have problems with loss of bowel control? - -  Managing your Medications? - -  Managing your Finances? - -  Housekeeping or managing your Housekeeping? - -  Some recent data might be hidden    Fall/Depression Screening: Fall Risk  02/02/2017 09/28/2016 08/19/2016  Falls in the past  year? No No Yes  Number falls in past yr: - - 2 or more  Injury with Fall? - - Yes   PHQ 2/9 Scores 02/02/2017 09/28/2016 08/19/2016  PHQ - 2 Score 0 5 0  PHQ- 9 Score - 7 -    Assessment:  Medication review with patient report, discharge summary, and medication list in chart.  Patient was recently discharged from hospital and all medications have been reviewed. Drugs sorted by system:  Neurologic/Psychologic: -duloxetine---patient reports not taking  Cardiovascular: -aspirin -furosemide -rosuvastatin   Pulmonary/Allergy: -aclidinium bromide (Tudorza)  -albuterol inhaler -albuterol nebs -fluticasone/vilanterol (Breo) ---counseled her to be sure to rinse mouth with water (swish and spit) after each use.    Gastrointestinal: -pantoprazole   Endocrine: -levothyroxine  -metformin  Infectious Diseases: -azithromycin---started on discharge   Miscellaneous: -cyclobenzaprine  -potassium chloride  -prednisone---started on discharge---she verbalized understanding to directions for taper.    Gaps in therapy:  -patient with diabetes not on ACE-inhibitor/ARB for renal protection.    Medications to avoid in the elderly:  -cyclobenzaprine---increased risk of anti-cholinergic side effects  Other issues noted:  -Patient was very anxious and overwhelmed during phone call.   -appears vitamin D may have been removed from her prior to admission medication list.  Last vitamin D level was 08/19/16---recommend verifying if patient is to be on vitamin D.    Care coordination: -Placed conference call with patient to her PCP, Dr Meda Coffee, she left a message to schedule her follow-up appointment on office voicemail.   Patient was also provided with PCP office phone number and encouraged to call PCP back.    -Placed a conference call to [facility] with patient---pharmacy reports for patient's next refills, she simply has to ask them to not bubble pack her medications if she no longer  wants bubble packing.    -Patient reports she needs a glucometer for her blood sugars---she was counseled to let her PCP know she needs this.    -Patient was counseled on importance of not identifying medications by color/shape and manufacturer change and as such pill appearance can change.    Plan:  Will route note to PCP.  Will place follow-up call to patient in the next 2 weeks to see if she obtained her refills from her pharmacy.   [name], PharmD, [place] [phone]

## 2017-02-03 NOTE — Telephone Encounter (Signed)
Called patient to do transitional care call and schedule 2 week hospital follow-up. Unable to reach patient. (This is the second time, as the message below was for the same reason)

## 2017-02-03 NOTE — Telephone Encounter (Signed)
Called patient regarding message below. No answer, left generic message for patient to return call.   

## 2017-02-03 NOTE — Telephone Encounter (Signed)
Correction, patient needs to be seen before 2 weeks

## 2017-02-03 NOTE — Telephone Encounter (Signed)
Called patient regarding message below. No answer, left generic message for patient to return call.  This is the third message.

## 2017-02-06 ENCOUNTER — Encounter: Payer: Self-pay | Admitting: Pharmacist

## 2017-02-06 ENCOUNTER — Telehealth: Payer: Self-pay | Admitting: Family Medicine

## 2017-02-06 NOTE — Telephone Encounter (Signed)
Patients daughter called in to schedule transitional visit for patient, I do not have a 40 min availability until the end of oct. Is this okay :?  Cb#: [phone]

## 2017-02-07 NOTE — Telephone Encounter (Signed)
Must be seen within 2 weeks of discharge date.

## 2017-02-09 ENCOUNTER — Ambulatory Visit (INDEPENDENT_AMBULATORY_CARE_PROVIDER_SITE_OTHER): Payer: Medicare Other | Admitting: Family Medicine

## 2017-02-09 ENCOUNTER — Encounter: Payer: Self-pay | Admitting: Family Medicine

## 2017-02-09 ENCOUNTER — Encounter: Payer: Self-pay | Admitting: *Deleted

## 2017-02-09 ENCOUNTER — Other Ambulatory Visit: Payer: Self-pay | Admitting: *Deleted

## 2017-02-09 VITALS — BP 112/78 | HR 120 | Temp 97.8°F | Resp 24 | Ht 61.0 in | Wt 138.1 lb

## 2017-02-09 DIAGNOSIS — E1159 Type 2 diabetes mellitus with other circulatory complications: Secondary | ICD-10-CM

## 2017-02-09 DIAGNOSIS — J439 Emphysema, unspecified: Secondary | ICD-10-CM | POA: Diagnosis not present

## 2017-02-09 DIAGNOSIS — Z720 Tobacco use: Secondary | ICD-10-CM

## 2017-02-09 DIAGNOSIS — I2510 Atherosclerotic heart disease of native coronary artery without angina pectoris: Secondary | ICD-10-CM | POA: Diagnosis not present

## 2017-02-09 DIAGNOSIS — R300 Dysuria: Secondary | ICD-10-CM

## 2017-02-09 LAB — POCT URINALYSIS DIPSTICK
Bilirubin, UA: NEGATIVE
Glucose, UA: NEGATIVE
Ketones, UA: NEGATIVE
Nitrite, UA: NEGATIVE
Protein, UA: NEGATIVE
Spec Grav, UA: 1.01 (ref 1.010–1.025)
Urobilinogen, UA: 0.2 E.U./dL
pH, UA: 6 (ref 5.0–8.0)

## 2017-02-09 MED ORDER — SULFAMETHOXAZOLE-TRIMETHOPRIM 800-160 MG PO TABS: 1.0000 | ORAL_TABLET | Freq: Two times a day (BID) | ORAL | 0 refills | Status: DC

## 2017-02-09 NOTE — Progress Notes (Signed)
Chief Complaint  Patient presents with  . Transitions Of Care   Here for follow up In hospital 9/24 and 25 /2018 for exacerbation COPD. She wax treated with antibiotics, prednisone and nebulized treatments Improved rapidly and went thome Completed the medicines prescribed Is back to baseline, but still "congested" with white sputum production.  No fever or chills.  NO wheezing. Is taking all of her medicines Weight is stable Complains of urine being dark with strong odor, slight burning.  No abdominal pain.  No hematuria. Did not yet get flu shot or prevnar.  Wants to wait. And will get in Nov when she comes for regular visit.  Hospital chart, labs, x ray reviewed  Patient Active Problem List   Diagnosis Date Noted  . COPD exacerbation ([place]) 01/30/2017  . Onychomycosis 05/28/2014  . Diabetic foot ulcer ([place]) 04/30/2014  . Rectocele 10/09/2012  . Diabetes ([place]) 10/05/2012  . Chronic respiratory failure with hypoxia ([place]) 10/04/2012  . CAD (coronary artery disease) 02/21/2012  . Systolic CHF, chronic ([place]) 02/21/2012  . Chronic back pain 02/20/2012  . Hypercapnia 07/30/2011  . HTN (hypertension) 07/11/2011  . Tobacco abuse 07/11/2011  . Anxiety disorder 07/11/2011  . Thyroid enlargement 06/28/2011  . Ischemic cardiomyopathy 06/28/2011  . DEPRESSION 02/04/2010  . COPD with emphysema ([place]) 02/04/2010    Outpatient Encounter Prescriptions as of 02/09/2017  Medication Sig  . Aclidinium Bromide (TUDORZA PRESSAIR) 400 MCG/ACT AEPB Inhale 2 puffs into the lungs 2 (two) times daily as needed.   Marland Kitchen albuterol (PROVENTIL HFA;VENTOLIN HFA) 108 (90 Base) MCG/ACT inhaler Inhale 2 puffs into the lungs every 6 (six) hours as needed for wheezing or shortness of breath.  Marland Kitchen albuterol (PROVENTIL) (2.5 MG/3ML) 0.083% nebulizer solution Take 3 mLs (2.5 mg total) by nebulization every 6 (six) hours as needed for wheezing or shortness of breath.  Marland Kitchen aspirin EC 81 MG tablet Take 81 mg by mouth  daily.  . cyclobenzaprine (FLEXERIL) 10 MG tablet Take 10 mg by mouth 2 (two) times daily.  . DULoxetine (CYMBALTA) 20 MG capsule TAKE 1 CAPSULE BY MOUTH EVERY DAY.  . fluticasone furoate-vilanterol (BREO ELLIPTA) 100-25 MCG/INH AEPB Inhale 1 puff into the lungs daily.  . furosemide (LASIX) 20 MG tablet Take 1 tablet (20 mg total) by mouth daily.  Marland Kitchen guaiFENesin (MUCINEX) 600 MG 12 hr tablet Take 1 tablet (600 mg total) by mouth 2 (two) times daily.  Marland Kitchen levothyroxine (SYNTHROID, LEVOTHROID) 25 MCG tablet Take 25 mcg by mouth daily.  . metFORMIN (GLUCOPHAGE) 500 MG tablet Take 500 mg by mouth 2 (two) times daily with a meal.   . OXYGEN Inhale 2 L into the lungs daily as needed.  . pantoprazole (PROTONIX) 40 MG tablet Take 40 mg by mouth daily.  . potassium chloride (K-DUR) 10 MEQ tablet Take 1 tablet (10 mEq total) by mouth daily.  . rosuvastatin (CRESTOR) 10 MG tablet Take 10 mg by mouth daily at 6 PM.  . sulfamethoxazole-trimethoprim (BACTRIM DS,SEPTRA DS) 800-160 MG tablet Take 1 tablet by mouth 2 (two) times daily.   No facility-administered encounter medications on file as of 02/09/2017.    No Known Allergies  Review of Systems  Constitutional: Positive for fatigue. Negative for activity change, appetite change, chills, fever and unexpected weight change.  HENT: Negative for congestion, dental problem, postnasal drip and rhinorrhea.   Eyes: Negative for redness and visual disturbance.  Respiratory: Positive for cough and shortness of breath. Negative for wheezing.   Cardiovascular: Negative for  chest pain, palpitations and leg swelling.  Gastrointestinal: Negative for abdominal pain, constipation and diarrhea.  Genitourinary: Positive for dysuria. Negative for difficulty urinating, flank pain, frequency, hematuria and vaginal bleeding.  Musculoskeletal: Positive for back pain. Negative for arthralgias.       Some chronic back pain  Neurological: Negative for dizziness and headaches.    Psychiatric/Behavioral: Negative for dysphoric mood and sleep disturbance. The patient is not nervous/anxious.     BP 112/78 (BP Location: Left Arm, Patient Position: Sitting, Cuff Size: Normal)   Pulse (!) 120   Temp 97.8 F (36.6 C) (Temporal)   Resp (!) 24   Ht 5\' 1"  (1.549 m)   Wt 138 lb 1.9 oz (62.7 kg)   SpO2 94%   BMI 26.10 kg/m   Physical Exam  Constitutional: She is oriented to person, place, and time. She appears well-developed and well-nourished. No distress.  Flushed, perspiring.  Walked to appointment.  HENT:  Head: Normocephalic and atraumatic.  Mouth/Throat: Oropharynx is clear and moist.  Eyes: Pupils are equal, round, and reactive to light. Conjunctivae are normal.  Neck: Normal range of motion. No thyromegaly present.  Cardiovascular: Normal rate and regular rhythm.   Pulmonary/Chest: Effort normal. She has no wheezes.  Decreased BS.  Few upper central rhonchi  Abdominal: Soft. Bowel sounds are normal.  Musculoskeletal:  No clubbing, cyanosis, or edema  Neurological: She is alert and oriented to person, place, and time.  Psychiatric: She has a normal mood and affect. Her behavior is normal.    ASSESSMENT/PLAN:  1. Pulmonary emphysema, unspecified emphysema type ([place]) recent exacerbation.  Discussed regular inhalers, avoiding allergens, smoking cessation.  Is down to 1 cig a day 2. Type 2 diabetes mellitus with other circulatory complication, without long-term current use of insulin ([place])  3. Tobacco abuse  4. Coronary artery disease involving native coronary artery of native heart without angina pectoris  5. Dysuria - Urine Culture - POCT urinalysis dipstick Results for orders placed or performed in visit on 02/09/17  POCT urinalysis dipstick  Result Value Ref Range   Color, UA yellow    Clarity, UA clear    Glucose, UA neg    Bilirubin, UA neg    Ketones, UA neg    Spec Grav, UA 1.010 1.010 - 1.025   Blood, UA small    pH, UA 6.0 5.0 - 8.0    Protein, UA neg    Urobilinogen, UA 0.2 0.2 or 1.0 E.U./dL   Nitrite, UA neg    Leukocytes, UA Trace (A) Negative      Patient Instructions  Congratulations on quitting smoking Drink plenty of water take the antibiotic for 3 days No change in medicines  See me in November as scheduled Call sooner for problems    Raylene Everts, MD

## 2017-02-09 NOTE — Patient Instructions (Addendum)
Congratulations on quitting smoking Drink plenty of water take the antibiotic for 3 days No change in medicines  See me in November as scheduled Call sooner for problems
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
## 2017-02-10 LAB — URINE CULTURE
MICRO NUMBER:: 81104819
Result:: NO GROWTH
SPECIMEN QUALITY:: ADEQUATE

## 2017-02-16 ENCOUNTER — Ambulatory Visit: Payer: Self-pay | Admitting: *Deleted

## 2017-02-17 ENCOUNTER — Other Ambulatory Visit: Payer: Self-pay | Admitting: Pharmacist

## 2017-02-17 NOTE — Patient Outreach (Signed)
[place] [facility]) Care Management  02/17/2017  Kristin Wells 01-16-1951 [id number]  Unsuccessful phone outreach to patient.  HIPAA compliant message left requesting return call.   Noted per [facility] RN note from 02/09/17, patient is planning to continue receiving medications in bubble packs.   Plan:  Second phone outreach next week if no return call.   [name], PharmD, [place] [phone]

## 2017-02-20 ENCOUNTER — Other Ambulatory Visit: Payer: Self-pay | Admitting: *Deleted

## 2017-02-20 ENCOUNTER — Other Ambulatory Visit: Payer: Self-pay | Admitting: Pharmacist

## 2017-02-20 NOTE — Patient Outreach (Signed)
[place] [facility]) Care Management  02/20/2017  Kristin Wells October 12, 1950 [id number]  Second outreach attempt to patient---HIPAA details verified---patient answered call.   Patient reports she is doing better with her medications.   She reports she has decided to keep receiving her medications in bubble packaging from [employer].    She denies questions/concerns with her medications at this time.    Patient reports she has a One Touch Ultra meter and she reports she needs trest strips and lancets for her glucometer.    She reports she has followed up with her PCP.  She reports she is seeing PCP again next month and is concerned not seeing PCP often enough---she was counseled PCP office typically determines appointment interval, but if she needs to see her provider sooner she should call PCP.    Plan:  Will route note to PCP and request PCP issue prescriptions for patient's diabetes testing supplies if patient is to be self monitoring blood glucose readings.   Will place follow-up call to patient in the next 3 weeks and consider case closure if no further pharmacy needs.   [name], PharmD, [place] [phone]

## 2017-02-20 NOTE — Patient Outreach (Signed)
[place] [facility]) Care Management  02/20/2017  Kristin Wells 08-01-1950 [id number]  Transition of care call #3  Telephone call to patient. HIPPA verification received from patient. Patient voices that she is doing well & has had no hospital admissions or emergency visits since last contact. States she has all of her medications & continues to take as instructed by her MD. Voices no COPD flair ups & is familiar with COPD action plan.  Patient voices that her aide takes her to all MD appointments. States that she exercises daily if weather permits  by walking.  Voices that he has no health concerns currently.  Care plan updated as noted. Goals have been met. Case will be closed with patient agreement.   Plan: Will advise Pharmacy who is active with case. Will send discipline closure letter to MD. Telephonic will close out of case.   Sherrin Daisy, RN BSN [place] Management Coordinator [facility] Care Management  [phone]

## 2017-02-21 ENCOUNTER — Encounter: Payer: Self-pay | Admitting: *Deleted

## 2017-02-27 ENCOUNTER — Other Ambulatory Visit: Payer: Self-pay | Admitting: Pharmacist

## 2017-02-27 ENCOUNTER — Telehealth: Payer: Self-pay | Admitting: Family Medicine

## 2017-02-27 NOTE — Telephone Encounter (Signed)
[name], PharmD, [place] [phone]   Calling to followup on the previous request for test strips and lancets

## 2017-02-27 NOTE — Patient Outreach (Signed)
[place] [facility]) Care Management  02/27/2017  Kristin Wells August 15, 1950 [id number]  Follow-up call to [facility], Dr Francesca Oman office, to follow-up regarding 02/20/17 note routed to PCP.    Patient reported during 02/20/17 call with [facility] Pharmacist she needed test strips and lancets for her glucometer.     Call was placed to follow-up with PCP office regarding this.  Office staff reports they will send message to PCP's nursing staff.   Plan:  Will follow-up next week with patient.   Kristin Wells, PharmD, [place] [phone]

## 2017-02-28 ENCOUNTER — Encounter: Payer: Self-pay | Admitting: Family Medicine

## 2017-03-01 NOTE — Telephone Encounter (Signed)
Returned call; recording stating Kristin Wells is out of the office until oct 29, left message to call office.

## 2017-03-02 DIAGNOSIS — J449 Chronic obstructive pulmonary disease, unspecified: Secondary | ICD-10-CM | POA: Diagnosis not present

## 2017-03-05 DIAGNOSIS — J9690 Respiratory failure, unspecified, unspecified whether with hypoxia or hypercapnia: Secondary | ICD-10-CM | POA: Diagnosis not present

## 2017-03-07 ENCOUNTER — Other Ambulatory Visit: Payer: Self-pay | Admitting: Pharmacist

## 2017-03-07 NOTE — Patient Outreach (Signed)
[place] [facility]) Care Management  03/07/2017  PAQUITA PRINTY [birth year] [id number]  Returned call to Geraldo Pitter, LPN with PCP office regarding patient's need of glucometer test strips and lancets.    Discussed will verify with patient during next successful outreach contact which meter and pharmacy she would like these sent to.    Plan:  Follow-up with patient as scheduled.   [name], PharmD, [place] [phone]

## 2017-03-08 ENCOUNTER — Other Ambulatory Visit: Payer: Self-pay | Admitting: Pharmacist

## 2017-03-08 NOTE — Patient Outreach (Addendum)
[place] [facility]) Care Management  03/08/2017  Kristin Wells 1950-07-02 [id number]  Unsuccessful phone outreach to patient. HIPAA compliant message left requesting return call.   Plan:  If no return call, will make second outreach attempt within the next week.   [name], PharmD, [place] [phone]  Addendum:  03/08/17 1503  Patient returned call.  HIPAA details verified.   Patient reports she is using a One Touch Ultra glucometer and needs lancets and test strips for it.  She reports she has some left at this time.  She reports she would like to have prescription sent to [facility] [place].     Patient denies other pharmacy related needs at this time.   Plan:  Will send message to [facility], LPN with Dr Francesca Oman office as discussed with [facility] yesterday.    Follow-up with patient to ensure she receives her test strips.   [name], PharmD, [place] [phone]

## 2017-03-09 ENCOUNTER — Ambulatory Visit: Payer: Self-pay | Admitting: Pharmacist

## 2017-03-10 ENCOUNTER — Ambulatory Visit: Payer: Self-pay | Admitting: Pharmacist

## 2017-03-14 ENCOUNTER — Other Ambulatory Visit: Payer: Self-pay | Admitting: Pharmacist

## 2017-03-14 NOTE — Patient Outreach (Signed)
[place] [facility]) Care Management  03/14/2017  KINDALL SWABY 1951-02-29 [id number]  Care coordination call to patient's PCP office to follow-up status of patient's glucometer test strips.   Note was routed to PCP on 02/20/17 that patient needed test strips.  Call placed to office 03/07/17 to follow-up and LPN was in-basket messaged 03/09/17.    Office staff reported LPN would call [facility] Pharmacist back.   Plan:  If no return call, will follow-up with PCP office again tomorrow.   [name], PharmD, [place] [phone]

## 2017-03-15 ENCOUNTER — Other Ambulatory Visit: Payer: Self-pay | Admitting: Pharmacist

## 2017-03-15 ENCOUNTER — Telehealth: Payer: Self-pay

## 2017-03-15 MED ORDER — BLOOD GLUCOSE MONITOR KIT: PACK | 0 refills | Status: AC

## 2017-03-15 NOTE — Telephone Encounter (Signed)
-----   Message from Lin Givens, [facility] sent at 03/09/2017  9:09 AM EDT ----- Good morning Kristin Wells:   I was able to speak with patient yesterday afternoon.   With regards to her glucometer---she reports she has the One Touch Ultra.   She reports she needs test strips and lancets and requests they be sent to [facility] in [place].   Can you see if Dr Meda Coffee will prescribe this for patient?   Thanks for all of your help.   [name], PharmD, [place] [phone]

## 2017-03-15 NOTE — Patient Outreach (Signed)
[facility]) Care Management  03/15/2017  Kristin Wells 1951-02-20 [id number]  Follow-up call placed to PCP office this morning regarding status of patient's glucometer supplies.  Front desk staff reported office would take care of today.   Received in-basket message from [facility], LPN with MD prescription was sent to patient's pharmacy of choice.   Phone call to patient, HIPAA details verified.  Patient repots she received her glucometer and testing supplies from her pharmacy today.  She was appreciative of help getting this taken care of.    Patient denies other pharmacy related needs at this time.   Plan:  Pharmacy case will be closed as patient denies other needs.  Update PCP on case closure.   Patient aware she can contact [facility] Pharmacist if needs arise in the future.   [name], PharmD, [place] [phone]

## 2017-03-20 ENCOUNTER — Ambulatory Visit: Payer: Self-pay | Admitting: Family Medicine

## 2017-04-02 DIAGNOSIS — J449 Chronic obstructive pulmonary disease, unspecified: Secondary | ICD-10-CM | POA: Diagnosis not present

## 2017-04-03 DIAGNOSIS — I119 Hypertensive heart disease without heart failure: Secondary | ICD-10-CM | POA: Diagnosis not present

## 2017-04-03 DIAGNOSIS — J441 Chronic obstructive pulmonary disease with (acute) exacerbation: Secondary | ICD-10-CM | POA: Diagnosis not present

## 2017-04-03 DIAGNOSIS — I10 Essential (primary) hypertension: Secondary | ICD-10-CM | POA: Diagnosis not present

## 2017-04-05 DIAGNOSIS — J9690 Respiratory failure, unspecified, unspecified whether with hypoxia or hypercapnia: Secondary | ICD-10-CM | POA: Diagnosis not present

## 2017-05-02 DIAGNOSIS — J449 Chronic obstructive pulmonary disease, unspecified: Secondary | ICD-10-CM | POA: Diagnosis not present

## 2017-05-05 DIAGNOSIS — J9690 Respiratory failure, unspecified, unspecified whether with hypoxia or hypercapnia: Secondary | ICD-10-CM | POA: Diagnosis not present

## 2017-05-13 ENCOUNTER — Encounter (HOSPITAL_COMMUNITY): Payer: Self-pay

## 2017-05-13 ENCOUNTER — Other Ambulatory Visit: Payer: Self-pay

## 2017-05-13 ENCOUNTER — Emergency Department (HOSPITAL_COMMUNITY): Payer: Medicare Other

## 2017-05-13 ENCOUNTER — Emergency Department (HOSPITAL_COMMUNITY)
Admission: EM | Admit: 2017-05-13 | Discharge: 2017-05-13 | Disposition: A | Discharge status: Home / Self Care | Payer: Medicare Other | Attending: Emergency Medicine | Admitting: Emergency Medicine

## 2017-05-13 DIAGNOSIS — Y929 Unspecified place or not applicable: Secondary | ICD-10-CM | POA: Insufficient documentation

## 2017-05-13 DIAGNOSIS — I110 Hypertensive heart disease with heart failure: Secondary | ICD-10-CM | POA: Diagnosis not present

## 2017-05-13 DIAGNOSIS — I5022 Chronic systolic (congestive) heart failure: Secondary | ICD-10-CM | POA: Insufficient documentation

## 2017-05-13 DIAGNOSIS — Y939 Activity, unspecified: Secondary | ICD-10-CM | POA: Insufficient documentation

## 2017-05-13 DIAGNOSIS — S3992XA Unspecified injury of lower back, initial encounter: Secondary | ICD-10-CM | POA: Diagnosis present

## 2017-05-13 DIAGNOSIS — E119 Type 2 diabetes mellitus without complications: Secondary | ICD-10-CM | POA: Insufficient documentation

## 2017-05-13 DIAGNOSIS — R0602 Shortness of breath: Secondary | ICD-10-CM | POA: Diagnosis not present

## 2017-05-13 DIAGNOSIS — J449 Chronic obstructive pulmonary disease, unspecified: Secondary | ICD-10-CM | POA: Diagnosis not present

## 2017-05-13 DIAGNOSIS — Z7982 Long term (current) use of aspirin: Secondary | ICD-10-CM | POA: Insufficient documentation

## 2017-05-13 DIAGNOSIS — Y999 Unspecified external cause status: Secondary | ICD-10-CM | POA: Insufficient documentation

## 2017-05-13 DIAGNOSIS — S299XXA Unspecified injury of thorax, initial encounter: Secondary | ICD-10-CM | POA: Diagnosis not present

## 2017-05-13 DIAGNOSIS — F17210 Nicotine dependence, cigarettes, uncomplicated: Secondary | ICD-10-CM | POA: Diagnosis not present

## 2017-05-13 DIAGNOSIS — Z79899 Other long term (current) drug therapy: Secondary | ICD-10-CM | POA: Insufficient documentation

## 2017-05-13 DIAGNOSIS — M545 Low back pain: Secondary | ICD-10-CM | POA: Diagnosis not present

## 2017-05-13 DIAGNOSIS — S39012A Strain of muscle, fascia and tendon of lower back, initial encounter: Secondary | ICD-10-CM | POA: Insufficient documentation

## 2017-05-13 DIAGNOSIS — R079 Chest pain, unspecified: Secondary | ICD-10-CM | POA: Diagnosis not present

## 2017-05-13 LAB — CBG MONITORING, ED: Glucose-Capillary: 107 mg/dL — ABNORMAL HIGH (ref 65–99)

## 2017-05-13 MED ORDER — IPRATROPIUM-ALBUTEROL 0.5-2.5 (3) MG/3ML IN SOLN
3.0000 mL | Freq: Once | RESPIRATORY_TRACT | Status: AC
  Administered 2017-05-13: 3 mL via RESPIRATORY_TRACT
  Filled 2017-05-13: qty 3
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
## 2017-05-13 NOTE — ED Notes (Signed)
Pt alert & oriented x4, stable gait. Patient given discharge instructions, paperwork & prescription(s). Patient  instructed to stop at the registration desk to finish any additional paperwork. Patient verbalized understanding. Pt left department w/ no further questions. 
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
## 2017-05-16 ENCOUNTER — Other Ambulatory Visit: Payer: Self-pay

## 2017-05-16 NOTE — Patient Outreach (Signed)
Left confidential voice mail in regards to patients most recent ED visit. Will mail unsuccessful letter, know before you go and magnet.

## 2017-05-18 ENCOUNTER — Telehealth: Payer: Self-pay | Admitting: Family Medicine

## 2017-05-18 NOTE — Telephone Encounter (Signed)
Incontinent supplies & supplements  Needed- case worker

## 2017-05-18 NOTE — Telephone Encounter (Signed)
pend

## 2017-05-19 NOTE — Telephone Encounter (Signed)
I am not sure what Kristin Wells needs, and where it needs to go to and there is no cb # to the case worker?

## 2017-05-25 ENCOUNTER — Encounter: Payer: Self-pay | Admitting: Family Medicine

## 2017-05-25 ENCOUNTER — Other Ambulatory Visit: Payer: Self-pay

## 2017-05-25 ENCOUNTER — Ambulatory Visit (INDEPENDENT_AMBULATORY_CARE_PROVIDER_SITE_OTHER): Payer: Medicare Other | Admitting: Family Medicine

## 2017-05-25 VITALS — BP 134/88 | HR 104 | Temp 98.2°F | Resp 20 | Ht 61.0 in | Wt 145.0 lb

## 2017-05-25 DIAGNOSIS — G8929 Other chronic pain: Secondary | ICD-10-CM

## 2017-05-25 DIAGNOSIS — Z23 Encounter for immunization: Secondary | ICD-10-CM | POA: Diagnosis not present

## 2017-05-25 DIAGNOSIS — R7303 Prediabetes: Secondary | ICD-10-CM | POA: Diagnosis not present

## 2017-05-25 DIAGNOSIS — I700 Atherosclerosis of aorta: Secondary | ICD-10-CM | POA: Diagnosis not present

## 2017-05-25 DIAGNOSIS — M545 Low back pain, unspecified: Secondary | ICD-10-CM

## 2017-05-25 MED ORDER — ALBUTEROL SULFATE HFA 108 (90 BASE) MCG/ACT IN AERS: 2.0000 | INHALATION_SPRAY | Freq: Four times a day (QID) | RESPIRATORY_TRACT | 1 refills | Status: DC | PRN

## 2017-05-25 MED ORDER — ALBUTEROL SULFATE (2.5 MG/3ML) 0.083% IN NEBU: 2.5000 mg | INHALATION_SOLUTION | Freq: Four times a day (QID) | RESPIRATORY_TRACT | 5 refills | Status: DC | PRN

## 2017-05-25 MED ORDER — FLUTICASONE FUROATE-VILANTEROL 100-25 MCG/INH IN AEPB: 1.0000 | INHALATION_SPRAY | Freq: Every day | RESPIRATORY_TRACT | 6 refills | Status: DC
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
## 2017-05-25 NOTE — Patient Instructions (Signed)
Due for lab tests and a repeat visit in 3 months No blood today Let me know if your back pain does not improved and I will order a back specialty consult  Walk every day that you are able  Try to quit smoking

## 2017-05-26 NOTE — Telephone Encounter (Signed)
Had an appt 1-17 with dr Meda Coffee

## 2017-05-31 ENCOUNTER — Other Ambulatory Visit: Payer: Self-pay | Admitting: Family Medicine

## 2017-05-31 DIAGNOSIS — F411 Generalized anxiety disorder: Secondary | ICD-10-CM

## 2017-06-01 ENCOUNTER — Telehealth: Payer: Self-pay | Admitting: Family Medicine

## 2017-06-01 MED ORDER — POISE LINERS PADS: 1.0000 | MEDICATED_PAD | 0 refills | Status: AC | PRN

## 2017-06-01 MED ORDER — GLUCERNA PO LIQD: 237.0000 mL | Freq: Three times a day (TID) | ORAL | 0 refills | Status: AC

## 2017-06-02 DIAGNOSIS — J449 Chronic obstructive pulmonary disease, unspecified: Secondary | ICD-10-CM | POA: Diagnosis not present

## 2017-06-05 DIAGNOSIS — J9690 Respiratory failure, unspecified, unspecified whether with hypoxia or hypercapnia: Secondary | ICD-10-CM | POA: Diagnosis not present

## 2017-06-06 ENCOUNTER — Telehealth: Payer: Self-pay | Admitting: Family Medicine

## 2017-06-06 DIAGNOSIS — J449 Chronic obstructive pulmonary disease, unspecified: Secondary | ICD-10-CM | POA: Diagnosis not present

## 2017-06-06 DIAGNOSIS — J441 Chronic obstructive pulmonary disease with (acute) exacerbation: Secondary | ICD-10-CM | POA: Diagnosis not present

## 2017-06-06 NOTE — Telephone Encounter (Signed)
Marvene from [place] called to request a prescription for pullups per patients request. Fax # [phone]

## 2017-06-07 NOTE — Telephone Encounter (Signed)
Unfortunately, Ori has never discussed this with me.  I do not have any diagnosis to support the need for incontinence supplies.  Please call [facility] for more information

## 2017-06-07 NOTE — Telephone Encounter (Signed)
Called patient regarding message below. No answer, unable to leave message.  

## 2017-06-12 NOTE — Telephone Encounter (Signed)
Called patient regarding message below. No answer, unable to leave message.  

## 2017-06-23 ENCOUNTER — Other Ambulatory Visit: Payer: Self-pay | Admitting: Family Medicine

## 2017-07-03 DIAGNOSIS — J449 Chronic obstructive pulmonary disease, unspecified: Secondary | ICD-10-CM | POA: Diagnosis not present

## 2017-07-06 DIAGNOSIS — J441 Chronic obstructive pulmonary disease with (acute) exacerbation: Secondary | ICD-10-CM | POA: Diagnosis not present

## 2017-07-06 DIAGNOSIS — J449 Chronic obstructive pulmonary disease, unspecified: Secondary | ICD-10-CM | POA: Diagnosis not present

## 2017-07-06 DIAGNOSIS — J9690 Respiratory failure, unspecified, unspecified whether with hypoxia or hypercapnia: Secondary | ICD-10-CM | POA: Diagnosis not present

## 2017-07-17 ENCOUNTER — Encounter: Payer: Self-pay | Admitting: Family Medicine

## 2017-07-25 ENCOUNTER — Other Ambulatory Visit: Payer: Self-pay | Admitting: Family Medicine

## 2017-07-25 DIAGNOSIS — F411 Generalized anxiety disorder: Secondary | ICD-10-CM

## 2017-07-31 DIAGNOSIS — J449 Chronic obstructive pulmonary disease, unspecified: Secondary | ICD-10-CM | POA: Diagnosis not present

## 2017-08-03 DIAGNOSIS — J9690 Respiratory failure, unspecified, unspecified whether with hypoxia or hypercapnia: Secondary | ICD-10-CM | POA: Diagnosis not present

## 2017-08-04 DIAGNOSIS — J441 Chronic obstructive pulmonary disease with (acute) exacerbation: Secondary | ICD-10-CM | POA: Diagnosis not present

## 2017-08-04 DIAGNOSIS — J449 Chronic obstructive pulmonary disease, unspecified: Secondary | ICD-10-CM | POA: Diagnosis not present

## 2017-08-10 DIAGNOSIS — I25119 Atherosclerotic heart disease of native coronary artery with unspecified angina pectoris: Secondary | ICD-10-CM | POA: Diagnosis not present

## 2017-08-10 DIAGNOSIS — J441 Chronic obstructive pulmonary disease with (acute) exacerbation: Secondary | ICD-10-CM | POA: Diagnosis not present

## 2017-08-10 DIAGNOSIS — I10 Essential (primary) hypertension: Secondary | ICD-10-CM | POA: Diagnosis not present

## 2017-08-10 DIAGNOSIS — J301 Allergic rhinitis due to pollen: Secondary | ICD-10-CM | POA: Diagnosis not present

## 2017-08-16 ENCOUNTER — Other Ambulatory Visit: Payer: Self-pay | Admitting: Family Medicine

## 2017-08-16 DIAGNOSIS — Z1231 Encounter for screening mammogram for malignant neoplasm of breast: Secondary | ICD-10-CM

## 2017-08-17 ENCOUNTER — Ambulatory Visit (HOSPITAL_COMMUNITY)
Admission: RE | Admit: 2017-08-17 | Discharge: 2017-08-17 | Disposition: A | Discharge status: Home / Self Care | Payer: Medicare Other | Source: Ambulatory Visit | Attending: Family Medicine | Admitting: Family Medicine

## 2017-08-17 ENCOUNTER — Encounter (HOSPITAL_COMMUNITY): Payer: Self-pay

## 2017-08-17 DIAGNOSIS — Z1231 Encounter for screening mammogram for malignant neoplasm of breast: Secondary | ICD-10-CM | POA: Insufficient documentation

## 2017-08-18 ENCOUNTER — Other Ambulatory Visit: Payer: Self-pay | Admitting: Family Medicine

## 2017-08-18 DIAGNOSIS — F411 Generalized anxiety disorder: Secondary | ICD-10-CM

## 2017-08-18 LAB — HM MAMMOGRAPHY

## 2017-08-22 ENCOUNTER — Other Ambulatory Visit: Payer: Self-pay

## 2017-08-22 ENCOUNTER — Ambulatory Visit (INDEPENDENT_AMBULATORY_CARE_PROVIDER_SITE_OTHER): Payer: Medicare Other | Admitting: Family Medicine

## 2017-08-22 ENCOUNTER — Encounter: Payer: Self-pay | Admitting: Family Medicine

## 2017-08-22 VITALS — BP 112/80 | HR 97 | Temp 98.0°F | Resp 16 | Ht 63.0 in | Wt 143.0 lb

## 2017-08-22 DIAGNOSIS — M545 Low back pain, unspecified: Secondary | ICD-10-CM

## 2017-08-22 DIAGNOSIS — G8929 Other chronic pain: Secondary | ICD-10-CM | POA: Diagnosis not present

## 2017-08-22 DIAGNOSIS — J439 Emphysema, unspecified: Secondary | ICD-10-CM

## 2017-08-22 DIAGNOSIS — J449 Chronic obstructive pulmonary disease, unspecified: Secondary | ICD-10-CM | POA: Diagnosis not present

## 2017-08-22 DIAGNOSIS — I700 Atherosclerosis of aorta: Secondary | ICD-10-CM

## 2017-08-22 DIAGNOSIS — R7303 Prediabetes: Secondary | ICD-10-CM | POA: Diagnosis not present

## 2017-08-22 DIAGNOSIS — E559 Vitamin D deficiency, unspecified: Secondary | ICD-10-CM | POA: Diagnosis not present

## 2017-08-22 DIAGNOSIS — H9193 Unspecified hearing loss, bilateral: Secondary | ICD-10-CM | POA: Diagnosis not present

## 2017-08-22 NOTE — Patient Instructions (Addendum)
You are due for blood test I will refer to ENT for a  Hearing check Continue under care of Dr Johnell Comings may take aleve if needed - take with food and avoid taking daily  Continue the current medicines  Congratulations on the exercise and weight loss  Follow up with new PCP  Need follow up in 3 months

## 2017-08-22 NOTE — Progress Notes (Signed)
Chief Complaint  Patient presents with  . Back Pain    follow up, back pain every day  Here for routine follow-up Is under the care of Dr. Luan Pulling in pulmonary medicine. She also sees cardiology for her history of coronary artery disease. She has a history of PTSD, anxiety, depression but chooses not to go to psychiatry or psychology.  She feels that she is stable.  Her PHQ score is high, but she reassures me that this is baseline for her.  She seems chronically dysthymic.  She insists she would not harm herself. She states that she is trying to increase her walking.  She is trying to lose weight.  She is trying to improve her fitness level.  This is been recommended to her for her breathing and for her medical conditions. She has reduced her tobacco intake.  One pack lasts her 3-4 days.  She is making an effort to quit.  She says stress is the reason she continues to smoke. She is due for blood work.  We need to check on her diabetes and lipids.  Is been 6 months since her last blood work. Patient states she has back pain on a daily basis.  She has known degenerative disc disease, facet hypertrophy, spinal stenosis.  She takes Tylenol and the pain is severe.  She wonders if it safe to take an occasional Aleve for pain.  We discussed that Aleve can upset stomach, should be taken with food.  It should not be taken daily.  She needs to be careful with her heart disease.  I believe occasional use is safe.  Patient Active Problem List   Diagnosis Date Noted  . COPD exacerbation ([place]) 01/30/2017  . Onychomycosis 05/28/2014  . Diabetic foot ulcer ([place]) 04/30/2014  . Rectocele 10/09/2012  . Diabetes ([place]) 10/05/2012  . Chronic respiratory failure with hypoxia ([place]) 10/04/2012  . CAD (coronary artery disease) 02/21/2012  . Systolic CHF, chronic ([place]) 02/21/2012  . Chronic back pain 02/20/2012  . Hypercapnia 07/30/2011  . HTN (hypertension) 07/11/2011  . Tobacco abuse 07/11/2011  . Anxiety  disorder 07/11/2011  . Thyroid enlargement 06/28/2011  . Ischemic cardiomyopathy 06/28/2011  . DEPRESSION 02/04/2010  . COPD with emphysema ([place]) 02/04/2010    Outpatient Encounter Medications as of 08/22/2017  Medication Sig  . Aclidinium Bromide (TUDORZA PRESSAIR) 400 MCG/ACT AEPB Inhale 2 puffs into the lungs 2 (two) times daily as needed.   Marland Kitchen albuterol (PROVENTIL) (2.5 MG/3ML) 0.083% nebulizer solution Take 3 mLs (2.5 mg total) by nebulization every 6 (six) hours as needed for wheezing or shortness of breath.  Marland Kitchen aspirin EC 81 MG tablet Take 81 mg by mouth daily.  . blood glucose meter kit and supplies KIT Dispense based on patient and insurance preference. Use up to four times daily as directed. (FOR ICD-9 250.00, 250.01).  . cyclobenzaprine (FLEXERIL) 10 MG tablet Take 10 mg by mouth 2 (two) times daily.  . DULoxetine (CYMBALTA) 20 MG capsule TAKE 1 CAPSULE BY MOUTH EVERY DAY.  . fluticasone furoate-vilanterol (BREO ELLIPTA) 100-25 MCG/INH AEPB Inhale 1 puff into the lungs daily.  . furosemide (LASIX) 20 MG tablet Take 1 tablet (20 mg total) by mouth daily.  Marland Kitchen GLUCERNA (GLUCERNA) LIQD Take 237 mLs by mouth 3 (three) times daily between meals.  Marland Kitchen guaiFENesin (MUCINEX) 600 MG 12 hr tablet Take 1 tablet (600 mg total) by mouth 2 (two) times daily.  . Incontinence Supply Disposable (POISE PANTILINERS) PADS 1 each by Does  not apply route as needed.  Marland Kitchen levothyroxine (SYNTHROID, LEVOTHROID) 25 MCG tablet Take 25 mcg by mouth daily.  Marland Kitchen loratadine (CLARITIN) 10 MG tablet Take 10 mg by mouth daily.  . metFORMIN (GLUCOPHAGE) 500 MG tablet Take 500 mg by mouth 2 (two) times daily with a meal.   . OXYGEN Inhale 2 L into the lungs daily as needed.  . pantoprazole (PROTONIX) 40 MG tablet Take 40 mg by mouth daily.  . potassium chloride (K-DUR) 10 MEQ tablet Take 1 tablet (10 mEq total) by mouth daily.  Marland Kitchen PROAIR HFA 108 (90 Base) MCG/ACT inhaler INHALE 2 PUFFS BY MOUTH EVERY 6 HOURS AS NEEDED FOR  WHEEZING OR SHORTNESS OF BREATH.  . rosuvastatin (CRESTOR) 10 MG tablet Take 10 mg by mouth daily at 6 PM.   No facility-administered encounter medications on file as of 08/22/2017.     No Known Allergies  Review of Systems  Physical Exam  Constitutional: She is oriented to person, place, and time. She appears well-developed and well-nourished. No distress.  Appears older than stated age  HENT:  Head: Normocephalic and atraumatic.  Mouth/Throat: Oropharynx is clear and moist.  Eyes: Pupils are equal, round, and reactive to light. Conjunctivae are normal.  Neck: Normal range of motion. No thyromegaly present.  Cardiovascular: Normal rate, regular rhythm and normal heart sounds.  Pulmonary/Chest: Effort normal.  Decreased BS  Musculoskeletal:  No clubbing, cyanosis, or edema  Neurological: She is alert and oriented to person, place, and time.  Uses a rolling walker.  Degenerative scoliosis obvious.  Antalgic gait with small steps.  Psychiatric: She has a normal mood and affect. Her behavior is normal.  Mild lability when discussing her past    BP 112/80   Pulse 97   Temp 98 F (36.7 C) (Temporal)   Resp 16   Ht '5\' 3"'$  (1.6 m)   Wt 143 lb 0.6 oz (64.9 kg)   SpO2 97%   BMI 25.34 kg/m     ASSESSMENT/PLAN:  1. Prediabetes Discussed diet and exercise.  Due to have laboratory recheck. - CBC - COMPLETE METABOLIC PANEL WITH GFR - Hemoglobin A1c - Lipid panel - TSH - Urinalysis, Routine w reflex microscopic - Microalbumin / creatinine urine ratio  2. Aortic atherosclerosis ([place]) On x-ray.  Is on lipid management.  Needs to quit smoking  3. Chronic midline low back pain without sciatica Chronic.  Degenerative changes, scoliosis, disc changes, spinal stenosis  4. Pulmonary emphysema, unspecified emphysema type ([place]) Under care of pulmonary medicine.  Continues to smoke  5. Bilateral hearing loss, unspecified hearing loss type Patient wishes referral to ENT to discuss  hearing aids. - Ambulatory referral to ENT - VITAMIN D 25 Hydroxy (Vit-D Deficiency, Fractures)  6. Vitamin D deficiency By history   Patient Instructions  You are due for blood test I will refer to ENT for a  Hearing check Continue under care of Dr Johnell Comings may take aleve if needed - take with food and avoid taking daily  Continue the current medicines  Congratulations on the exercise and weight loss  Follow up with new PCP  Need follow up in 3 months   Kristin Everts, MD

## 2017-08-23 ENCOUNTER — Encounter: Payer: Self-pay | Admitting: Family Medicine

## 2017-08-23 LAB — URINALYSIS, ROUTINE W REFLEX MICROSCOPIC
Bacteria, UA: NONE SEEN /HPF
Bilirubin Urine: NEGATIVE
Glucose, UA: NEGATIVE
Hyaline Cast: NONE SEEN /LPF
Ketones, ur: NEGATIVE
Nitrite: NEGATIVE
Protein, ur: NEGATIVE
RBC / HPF: NONE SEEN /HPF (ref 0–2)
Specific Gravity, Urine: 1.01 (ref 1.001–1.03)
pH: 7 (ref 5.0–8.0)

## 2017-08-23 LAB — LIPID PANEL
Cholesterol: 132 mg/dL (ref <200)
HDL: 54 mg/dL (ref >50)
LDL Cholesterol (Calc): 57 mg/dL (calc)
Non-HDL Cholesterol (Calc): 78 mg/dL (calc) (ref <130)
Total CHOL/HDL Ratio: 2.4 (calc) (ref <5.0)
Triglycerides: 119 mg/dL (ref <150)

## 2017-08-23 LAB — CBC
HCT: 42.2 % (ref 35.0–45.0)
Hemoglobin: 14.1 g/dL (ref 11.7–15.5)
MCH: 28 pg (ref 27.0–33.0)
MCHC: 33.4 g/dL (ref 32.0–36.0)
MCV: 83.9 fL (ref 80.0–100.0)
MPV: 9.9 fL (ref 7.5–12.5)
Platelets: 228 10*3/uL (ref 140–400)
RBC: 5.03 10*6/uL (ref 3.80–5.10)
RDW: 13.2 % (ref 11.0–15.0)
WBC: 9.4 10*3/uL (ref 3.8–10.8)

## 2017-08-23 LAB — COMPLETE METABOLIC PANEL WITH GFR
AG Ratio: 1.7 (calc) (ref 1.0–2.5)
ALT: 11 U/L (ref 6–29)
AST: 9 U/L — ABNORMAL LOW (ref 10–35)
Albumin: 4 g/dL (ref 3.6–5.1)
Alkaline phosphatase (APISO): 91 U/L (ref 33–130)
BUN: 12 mg/dL (ref 7–25)
CO2: 34 mmol/L — ABNORMAL HIGH (ref 20–32)
Calcium: 8.8 mg/dL (ref 8.6–10.4)
Chloride: 99 mmol/L (ref 98–110)
Creat: 0.97 mg/dL (ref 0.50–0.99)
GFR, Est African American: 71 mL/min/{1.73_m2} (ref >=60)
GFR, Est Non African American: 61 mL/min/{1.73_m2} (ref >=60)
Globulin: 2.4 g/dL (calc) (ref 1.9–3.7)
Glucose, Bld: 91 mg/dL (ref 65–99)
Potassium: 3.8 mmol/L (ref 3.5–5.3)
Sodium: 139 mmol/L (ref 135–146)
Total Bilirubin: 0.4 mg/dL (ref 0.2–1.2)
Total Protein: 6.4 g/dL (ref 6.1–8.1)

## 2017-08-23 LAB — HEMOGLOBIN A1C
Hgb A1c MFr Bld: 5.8 % of total Hgb — ABNORMAL HIGH (ref <5.7)
Mean Plasma Glucose: 120 (calc)
eAG (mmol/L): 6.6 (calc)

## 2017-08-23 LAB — TSH: TSH: 4.54 mIU/L — ABNORMAL HIGH (ref 0.40–4.50)

## 2017-08-23 LAB — MICROALBUMIN / CREATININE URINE RATIO
Creatinine, Urine: 18 mg/dL — ABNORMAL LOW (ref 20–275)
Microalb Creat Ratio: 28 mcg/mg creat (ref <30)
Microalb, Ur: 0.5 mg/dL

## 2017-08-23 LAB — VITAMIN D 25 HYDROXY (VIT D DEFICIENCY, FRACTURES): Vit D, 25-Hydroxy: 22 ng/mL — ABNORMAL LOW (ref 30–100)

## 2017-08-25 ENCOUNTER — Other Ambulatory Visit: Payer: Self-pay | Admitting: Family Medicine

## 2017-08-31 DIAGNOSIS — J449 Chronic obstructive pulmonary disease, unspecified: Secondary | ICD-10-CM | POA: Diagnosis not present

## 2017-09-03 DIAGNOSIS — J9690 Respiratory failure, unspecified, unspecified whether with hypoxia or hypercapnia: Secondary | ICD-10-CM | POA: Diagnosis not present

## 2017-09-04 DIAGNOSIS — J441 Chronic obstructive pulmonary disease with (acute) exacerbation: Secondary | ICD-10-CM | POA: Diagnosis not present

## 2017-09-07 NOTE — Congregational Nurse Program (Signed)
Congregational Nurse Program Note  Date of Encounter: 09/07/2017  Past Medical History: Past Medical History:  Diagnosis Date  . Acute respiratory failure ([place]) 06/2011   Vent dependent sec to COPD/PNA  . Anxiety   . Arthritis   . Back pain   . Bronchitis   . CHF (congestive heart failure) ([place])   . COPD (chronic obstructive pulmonary disease) ([place])   . Coronary artery disease   . Demand ischemia of myocardium 06/28/2011  . Depression   . Diabetes mellitus without complication ([place])   . Hyperlipidemia   . Hypertension   . Ischemic cardiomyopathy 06/28/2011   EF 25%. Myoview stress test later showed ejection fraction of 65%  . MI, acute, non ST segment elevation ([place]) 06/29/2011   Myoview stress test revealed mild perfusion defect  . Myocardial infarction ([place])   . Oxygen deficiency   . PNA (pneumonia) 06/29/2011  . Pneumonia   . Rectocele 10/09/2012  . Substance abuse ([place])    MANY YEARS AGO  . Thyroid enlargement 06/28/2011    Encounter Details: CNP Questionnaire - 09/07/17 1430      Questionnaire   Patient Status  Not Applicable    Race  White or Caucasian    Location Patient Served At  [place], [place]  Medicaid    Uninsured  Not Applicable    Food  No food insecurities    Housing/Utilities  Yes, have permanent housing    Transportation  No transportation needs    Interpersonal Safety  Yes, feel physically and emotionally safe where you currently live    Medication  No medication insecurities    Medical Provider  Yes    Referrals  Not Applicable    ED Visit Averted  Not Applicable    Life-Saving Intervention Made  Not Applicable     e  Seen at the [employer].No complaints. B/P 109/71; P [place], [place]  Program [phone]

## 2017-09-22 ENCOUNTER — Other Ambulatory Visit: Payer: Self-pay | Admitting: Family Medicine

## 2017-09-22 DIAGNOSIS — F411 Generalized anxiety disorder: Secondary | ICD-10-CM

## 2017-09-28 ENCOUNTER — Ambulatory Visit (INDEPENDENT_AMBULATORY_CARE_PROVIDER_SITE_OTHER): Payer: Self-pay | Admitting: Otolaryngology

## 2017-10-03 DIAGNOSIS — J9690 Respiratory failure, unspecified, unspecified whether with hypoxia or hypercapnia: Secondary | ICD-10-CM | POA: Diagnosis not present

## 2017-10-04 DIAGNOSIS — J441 Chronic obstructive pulmonary disease with (acute) exacerbation: Secondary | ICD-10-CM | POA: Diagnosis not present

## 2017-10-09 ENCOUNTER — Ambulatory Visit (INDEPENDENT_AMBULATORY_CARE_PROVIDER_SITE_OTHER): Payer: Medicare Other | Admitting: Otolaryngology

## 2017-10-09 DIAGNOSIS — H903 Sensorineural hearing loss, bilateral: Secondary | ICD-10-CM | POA: Diagnosis not present

## 2017-10-26 ENCOUNTER — Other Ambulatory Visit: Payer: Self-pay | Admitting: Family Medicine

## 2017-11-03 DIAGNOSIS — J9690 Respiratory failure, unspecified, unspecified whether with hypoxia or hypercapnia: Secondary | ICD-10-CM | POA: Diagnosis not present

## 2017-11-04 DIAGNOSIS — J441 Chronic obstructive pulmonary disease with (acute) exacerbation: Secondary | ICD-10-CM | POA: Diagnosis not present

## 2017-12-03 DIAGNOSIS — J9690 Respiratory failure, unspecified, unspecified whether with hypoxia or hypercapnia: Secondary | ICD-10-CM | POA: Diagnosis not present

## 2017-12-04 DIAGNOSIS — J441 Chronic obstructive pulmonary disease with (acute) exacerbation: Secondary | ICD-10-CM | POA: Diagnosis not present

## 2017-12-12 DIAGNOSIS — J449 Chronic obstructive pulmonary disease, unspecified: Secondary | ICD-10-CM | POA: Diagnosis not present

## 2017-12-12 DIAGNOSIS — E039 Hypothyroidism, unspecified: Secondary | ICD-10-CM | POA: Diagnosis not present

## 2017-12-12 DIAGNOSIS — I10 Essential (primary) hypertension: Secondary | ICD-10-CM | POA: Diagnosis not present

## 2018-01-03 DIAGNOSIS — J449 Chronic obstructive pulmonary disease, unspecified: Secondary | ICD-10-CM | POA: Diagnosis not present

## 2018-01-03 DIAGNOSIS — I10 Essential (primary) hypertension: Secondary | ICD-10-CM | POA: Diagnosis not present

## 2018-01-03 DIAGNOSIS — J9690 Respiratory failure, unspecified, unspecified whether with hypoxia or hypercapnia: Secondary | ICD-10-CM | POA: Diagnosis not present

## 2018-01-04 DIAGNOSIS — J441 Chronic obstructive pulmonary disease with (acute) exacerbation: Secondary | ICD-10-CM | POA: Diagnosis not present

## 2018-01-18 DIAGNOSIS — R21 Rash and other nonspecific skin eruption: Secondary | ICD-10-CM | POA: Diagnosis not present

## 2018-01-18 DIAGNOSIS — E119 Type 2 diabetes mellitus without complications: Secondary | ICD-10-CM | POA: Diagnosis not present

## 2018-01-18 DIAGNOSIS — I509 Heart failure, unspecified: Secondary | ICD-10-CM | POA: Diagnosis not present

## 2018-01-18 DIAGNOSIS — J449 Chronic obstructive pulmonary disease, unspecified: Secondary | ICD-10-CM | POA: Diagnosis not present

## 2018-01-23 DIAGNOSIS — E785 Hyperlipidemia, unspecified: Secondary | ICD-10-CM | POA: Diagnosis not present

## 2018-01-23 DIAGNOSIS — E119 Type 2 diabetes mellitus without complications: Secondary | ICD-10-CM | POA: Diagnosis not present

## 2018-01-23 DIAGNOSIS — I509 Heart failure, unspecified: Secondary | ICD-10-CM | POA: Diagnosis not present

## 2018-01-23 DIAGNOSIS — E039 Hypothyroidism, unspecified: Secondary | ICD-10-CM | POA: Diagnosis not present

## 2018-01-23 DIAGNOSIS — E559 Vitamin D deficiency, unspecified: Secondary | ICD-10-CM | POA: Diagnosis not present

## 2018-01-30 ENCOUNTER — Encounter: Payer: Self-pay | Admitting: Cardiovascular Disease

## 2018-02-03 DIAGNOSIS — J9690 Respiratory failure, unspecified, unspecified whether with hypoxia or hypercapnia: Secondary | ICD-10-CM | POA: Diagnosis not present

## 2018-02-04 DIAGNOSIS — J441 Chronic obstructive pulmonary disease with (acute) exacerbation: Secondary | ICD-10-CM | POA: Diagnosis not present

## 2018-03-05 DIAGNOSIS — J9690 Respiratory failure, unspecified, unspecified whether with hypoxia or hypercapnia: Secondary | ICD-10-CM | POA: Diagnosis not present

## 2018-03-06 DIAGNOSIS — J441 Chronic obstructive pulmonary disease with (acute) exacerbation: Secondary | ICD-10-CM | POA: Diagnosis not present

## 2018-04-02 DIAGNOSIS — I10 Essential (primary) hypertension: Secondary | ICD-10-CM | POA: Diagnosis not present

## 2018-04-02 DIAGNOSIS — I119 Hypertensive heart disease without heart failure: Secondary | ICD-10-CM | POA: Diagnosis not present

## 2018-04-02 DIAGNOSIS — J449 Chronic obstructive pulmonary disease, unspecified: Secondary | ICD-10-CM | POA: Diagnosis not present

## 2018-04-05 DIAGNOSIS — J9690 Respiratory failure, unspecified, unspecified whether with hypoxia or hypercapnia: Secondary | ICD-10-CM | POA: Diagnosis not present

## 2018-04-09 ENCOUNTER — Ambulatory Visit (INDEPENDENT_AMBULATORY_CARE_PROVIDER_SITE_OTHER): Payer: Self-pay | Admitting: Otolaryngology

## 2018-04-21 DIAGNOSIS — J441 Chronic obstructive pulmonary disease with (acute) exacerbation: Secondary | ICD-10-CM | POA: Diagnosis not present

## 2018-05-05 DIAGNOSIS — J9690 Respiratory failure, unspecified, unspecified whether with hypoxia or hypercapnia: Secondary | ICD-10-CM | POA: Diagnosis not present

## 2018-05-22 DIAGNOSIS — J441 Chronic obstructive pulmonary disease with (acute) exacerbation: Secondary | ICD-10-CM | POA: Diagnosis not present

## 2018-06-22 DIAGNOSIS — J441 Chronic obstructive pulmonary disease with (acute) exacerbation: Secondary | ICD-10-CM | POA: Diagnosis not present

## 2018-07-03 DIAGNOSIS — M545 Low back pain: Secondary | ICD-10-CM | POA: Diagnosis not present

## 2018-07-03 DIAGNOSIS — I25119 Atherosclerotic heart disease of native coronary artery with unspecified angina pectoris: Secondary | ICD-10-CM | POA: Diagnosis not present

## 2018-07-03 DIAGNOSIS — J449 Chronic obstructive pulmonary disease, unspecified: Secondary | ICD-10-CM | POA: Diagnosis not present

## 2018-08-04 DIAGNOSIS — J9690 Respiratory failure, unspecified, unspecified whether with hypoxia or hypercapnia: Secondary | ICD-10-CM | POA: Diagnosis not present

## 2018-08-04 DIAGNOSIS — M47897 Other spondylosis, lumbosacral region: Secondary | ICD-10-CM | POA: Diagnosis not present

## 2018-08-04 DIAGNOSIS — J449 Chronic obstructive pulmonary disease, unspecified: Secondary | ICD-10-CM | POA: Diagnosis not present

## 2018-09-04 DIAGNOSIS — J449 Chronic obstructive pulmonary disease, unspecified: Secondary | ICD-10-CM | POA: Diagnosis not present

## 2018-09-04 DIAGNOSIS — J9690 Respiratory failure, unspecified, unspecified whether with hypoxia or hypercapnia: Secondary | ICD-10-CM | POA: Diagnosis not present

## 2018-09-04 DIAGNOSIS — M47897 Other spondylosis, lumbosacral region: Secondary | ICD-10-CM | POA: Diagnosis not present

## 2018-09-20 DIAGNOSIS — J441 Chronic obstructive pulmonary disease with (acute) exacerbation: Secondary | ICD-10-CM | POA: Diagnosis not present

## 2018-10-04 DIAGNOSIS — J449 Chronic obstructive pulmonary disease, unspecified: Secondary | ICD-10-CM | POA: Diagnosis not present

## 2018-10-04 DIAGNOSIS — J9690 Respiratory failure, unspecified, unspecified whether with hypoxia or hypercapnia: Secondary | ICD-10-CM | POA: Diagnosis not present

## 2018-10-04 DIAGNOSIS — M47897 Other spondylosis, lumbosacral region: Secondary | ICD-10-CM | POA: Diagnosis not present

## 2018-11-04 DIAGNOSIS — J9690 Respiratory failure, unspecified, unspecified whether with hypoxia or hypercapnia: Secondary | ICD-10-CM | POA: Diagnosis not present

## 2018-11-04 DIAGNOSIS — J449 Chronic obstructive pulmonary disease, unspecified: Secondary | ICD-10-CM | POA: Diagnosis not present

## 2018-11-04 DIAGNOSIS — M47897 Other spondylosis, lumbosacral region: Secondary | ICD-10-CM | POA: Diagnosis not present

## 2018-12-04 DIAGNOSIS — J449 Chronic obstructive pulmonary disease, unspecified: Secondary | ICD-10-CM | POA: Diagnosis not present

## 2018-12-04 DIAGNOSIS — M47897 Other spondylosis, lumbosacral region: Secondary | ICD-10-CM | POA: Diagnosis not present

## 2018-12-04 DIAGNOSIS — J9690 Respiratory failure, unspecified, unspecified whether with hypoxia or hypercapnia: Secondary | ICD-10-CM | POA: Diagnosis not present

## 2018-12-05 DIAGNOSIS — I2510 Atherosclerotic heart disease of native coronary artery without angina pectoris: Secondary | ICD-10-CM | POA: Diagnosis not present

## 2018-12-05 DIAGNOSIS — J449 Chronic obstructive pulmonary disease, unspecified: Secondary | ICD-10-CM | POA: Diagnosis not present

## 2018-12-05 DIAGNOSIS — E1169 Type 2 diabetes mellitus with other specified complication: Secondary | ICD-10-CM | POA: Diagnosis not present

## 2019-01-04 DIAGNOSIS — J9690 Respiratory failure, unspecified, unspecified whether with hypoxia or hypercapnia: Secondary | ICD-10-CM | POA: Diagnosis not present

## 2019-01-04 DIAGNOSIS — M47897 Other spondylosis, lumbosacral region: Secondary | ICD-10-CM | POA: Diagnosis not present

## 2019-01-04 DIAGNOSIS — J449 Chronic obstructive pulmonary disease, unspecified: Secondary | ICD-10-CM | POA: Diagnosis not present

## 2019-05-04 DIAGNOSIS — J449 Chronic obstructive pulmonary disease, unspecified: Secondary | ICD-10-CM | POA: Insufficient documentation

## 2019-05-04 DIAGNOSIS — I119 Hypertensive heart disease without heart failure: Secondary | ICD-10-CM

## 2019-05-04 DIAGNOSIS — E785 Hyperlipidemia, unspecified: Secondary | ICD-10-CM

## 2019-05-04 DIAGNOSIS — E1151 Type 2 diabetes mellitus with diabetic peripheral angiopathy without gangrene: Secondary | ICD-10-CM

## 2019-05-04 DIAGNOSIS — N3281 Overactive bladder: Secondary | ICD-10-CM

## 2019-05-04 DIAGNOSIS — F419 Anxiety disorder, unspecified: Secondary | ICD-10-CM | POA: Insufficient documentation

## 2019-05-04 DIAGNOSIS — M86179 Other acute osteomyelitis, unspecified ankle and foot: Secondary | ICD-10-CM

## 2019-05-04 DIAGNOSIS — E039 Hypothyroidism, unspecified: Secondary | ICD-10-CM

## 2019-05-04 DIAGNOSIS — J441 Chronic obstructive pulmonary disease with (acute) exacerbation: Secondary | ICD-10-CM

## 2019-05-04 DIAGNOSIS — E890 Postprocedural hypothyroidism: Secondary | ICD-10-CM | POA: Insufficient documentation

## 2019-05-04 DIAGNOSIS — F17200 Nicotine dependence, unspecified, uncomplicated: Secondary | ICD-10-CM

## 2019-05-04 DIAGNOSIS — E782 Mixed hyperlipidemia: Secondary | ICD-10-CM | POA: Insufficient documentation

## 2019-05-04 DIAGNOSIS — M792 Neuralgia and neuritis, unspecified: Secondary | ICD-10-CM

## 2019-05-04 DIAGNOSIS — E1165 Type 2 diabetes mellitus with hyperglycemia: Secondary | ICD-10-CM

## 2019-05-04 DIAGNOSIS — R569 Unspecified convulsions: Secondary | ICD-10-CM

## 2019-05-04 DIAGNOSIS — R609 Edema, unspecified: Secondary | ICD-10-CM

## 2019-05-04 DIAGNOSIS — I25119 Atherosclerotic heart disease of native coronary artery with unspecified angina pectoris: Secondary | ICD-10-CM

## 2019-05-04 DIAGNOSIS — F329 Major depressive disorder, single episode, unspecified: Secondary | ICD-10-CM

## 2019-05-06 DIAGNOSIS — M47897 Other spondylosis, lumbosacral region: Secondary | ICD-10-CM | POA: Diagnosis not present

## 2019-05-06 DIAGNOSIS — J9690 Respiratory failure, unspecified, unspecified whether with hypoxia or hypercapnia: Secondary | ICD-10-CM | POA: Diagnosis not present

## 2019-05-06 DIAGNOSIS — J449 Chronic obstructive pulmonary disease, unspecified: Secondary | ICD-10-CM | POA: Diagnosis not present

## 2019-06-06 DIAGNOSIS — J9690 Respiratory failure, unspecified, unspecified whether with hypoxia or hypercapnia: Secondary | ICD-10-CM | POA: Diagnosis not present

## 2019-06-06 DIAGNOSIS — M47897 Other spondylosis, lumbosacral region: Secondary | ICD-10-CM | POA: Diagnosis not present

## 2019-06-06 DIAGNOSIS — J449 Chronic obstructive pulmonary disease, unspecified: Secondary | ICD-10-CM | POA: Diagnosis not present

## 2019-09-25 DIAGNOSIS — E039 Hypothyroidism, unspecified: Secondary | ICD-10-CM | POA: Diagnosis not present

## 2019-09-25 DIAGNOSIS — E11610 Type 2 diabetes mellitus with diabetic neuropathic arthropathy: Secondary | ICD-10-CM | POA: Diagnosis not present

## 2019-09-25 DIAGNOSIS — J449 Chronic obstructive pulmonary disease, unspecified: Secondary | ICD-10-CM | POA: Diagnosis not present

## 2019-09-25 DIAGNOSIS — J309 Allergic rhinitis, unspecified: Secondary | ICD-10-CM | POA: Diagnosis not present

## 2019-09-25 DIAGNOSIS — E785 Hyperlipidemia, unspecified: Secondary | ICD-10-CM | POA: Diagnosis not present

## 2019-11-27 DIAGNOSIS — Z7984 Long term (current) use of oral hypoglycemic drugs: Secondary | ICD-10-CM | POA: Diagnosis not present

## 2019-11-27 DIAGNOSIS — E119 Type 2 diabetes mellitus without complications: Secondary | ICD-10-CM | POA: Diagnosis not present

## 2019-11-27 DIAGNOSIS — Z0001 Encounter for general adult medical examination with abnormal findings: Secondary | ICD-10-CM | POA: Diagnosis not present

## 2019-11-27 DIAGNOSIS — E785 Hyperlipidemia, unspecified: Secondary | ICD-10-CM | POA: Diagnosis not present

## 2019-11-27 DIAGNOSIS — Z79899 Other long term (current) drug therapy: Secondary | ICD-10-CM | POA: Diagnosis not present

## 2019-12-04 DIAGNOSIS — J449 Chronic obstructive pulmonary disease, unspecified: Secondary | ICD-10-CM | POA: Diagnosis not present

## 2019-12-04 DIAGNOSIS — J9690 Respiratory failure, unspecified, unspecified whether with hypoxia or hypercapnia: Secondary | ICD-10-CM | POA: Diagnosis not present

## 2019-12-04 DIAGNOSIS — M47897 Other spondylosis, lumbosacral region: Secondary | ICD-10-CM | POA: Diagnosis not present

## 2020-01-04 DIAGNOSIS — J9690 Respiratory failure, unspecified, unspecified whether with hypoxia or hypercapnia: Secondary | ICD-10-CM | POA: Diagnosis not present

## 2020-01-04 DIAGNOSIS — J449 Chronic obstructive pulmonary disease, unspecified: Secondary | ICD-10-CM | POA: Diagnosis not present

## 2020-01-04 DIAGNOSIS — M47897 Other spondylosis, lumbosacral region: Secondary | ICD-10-CM | POA: Diagnosis not present

## 2020-02-12 DIAGNOSIS — E663 Overweight: Secondary | ICD-10-CM | POA: Diagnosis not present

## 2020-02-12 DIAGNOSIS — E785 Hyperlipidemia, unspecified: Secondary | ICD-10-CM | POA: Diagnosis not present

## 2020-02-12 DIAGNOSIS — Z7984 Long term (current) use of oral hypoglycemic drugs: Secondary | ICD-10-CM | POA: Diagnosis not present

## 2020-02-12 DIAGNOSIS — Z79899 Other long term (current) drug therapy: Secondary | ICD-10-CM | POA: Diagnosis not present

## 2020-02-12 DIAGNOSIS — E119 Type 2 diabetes mellitus without complications: Secondary | ICD-10-CM | POA: Diagnosis not present

## 2020-02-12 DIAGNOSIS — I110 Hypertensive heart disease with heart failure: Secondary | ICD-10-CM | POA: Diagnosis not present

## 2020-08-19 LAB — LIPID PANEL
Cholesterol: 143 (ref 0–200)
HDL: 46 (ref 35–70)
LDL Cholesterol: 75
Triglycerides: 125 (ref 40–160)

## 2020-08-19 LAB — HEMOGLOBIN A1C: Hemoglobin A1C: 5.9

## 2020-08-19 LAB — BASIC METABOLIC PANEL
BUN: 10 (ref 4–21)
Creatinine: 1.1 (ref 0.5–1.1)

## 2020-08-19 LAB — COMPREHENSIVE METABOLIC PANEL
Albumin: 4.2 (ref 3.5–5.0)
Calcium: 9.2 (ref 8.7–10.7)

## 2020-08-19 LAB — TSH: TSH: 4.99 (ref 0.41–5.90)

## 2020-09-16 ENCOUNTER — Encounter: Payer: Self-pay | Admitting: Emergency Medicine

## 2020-09-16 ENCOUNTER — Ambulatory Visit
Admission: EM | Admit: 2020-09-16 | Discharge: 2020-09-16 | Disposition: A | Discharge status: Home / Self Care | Payer: Medicare HMO | Attending: Family Medicine | Admitting: Family Medicine

## 2020-09-16 DIAGNOSIS — B9789 Other viral agents as the cause of diseases classified elsewhere: Secondary | ICD-10-CM | POA: Diagnosis not present

## 2020-09-16 DIAGNOSIS — R590 Localized enlarged lymph nodes: Secondary | ICD-10-CM

## 2020-09-16 DIAGNOSIS — J028 Acute pharyngitis due to other specified organisms: Secondary | ICD-10-CM | POA: Diagnosis not present

## 2020-09-16 DIAGNOSIS — F411 Generalized anxiety disorder: Secondary | ICD-10-CM

## 2020-09-16 MED ORDER — CETIRIZINE HCL 1 MG/ML PO SOLN: 10.0000 mg | Freq: Every day | ORAL | 0 refills | Status: DC

## 2020-09-16 NOTE — ED Triage Notes (Signed)
Swollen nodes in neck started on Tuesday.  States it burns in her throat to eat.  States she can't sleep and her hair is falling out.  States neck is tender to the touch.
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
## 2020-09-16 NOTE — Discharge Instructions (Signed)
I have sent in some liquid allergy medication for you to take once a day (66mL)  If this burns your throat, you may water it down.  If you are unable to keep down fluids or get any in at all, I would have you follow up in the ER for possible IV rehydration.

## 2020-10-02 DIAGNOSIS — E039 Hypothyroidism, unspecified: Secondary | ICD-10-CM | POA: Diagnosis not present

## 2020-10-03 DIAGNOSIS — M47897 Other spondylosis, lumbosacral region: Secondary | ICD-10-CM | POA: Diagnosis not present

## 2020-10-03 DIAGNOSIS — G4089 Other seizures: Secondary | ICD-10-CM | POA: Diagnosis not present

## 2020-10-03 DIAGNOSIS — J9690 Respiratory failure, unspecified, unspecified whether with hypoxia or hypercapnia: Secondary | ICD-10-CM | POA: Diagnosis not present

## 2020-10-03 DIAGNOSIS — J449 Chronic obstructive pulmonary disease, unspecified: Secondary | ICD-10-CM | POA: Diagnosis not present

## 2020-10-06 DIAGNOSIS — E039 Hypothyroidism, unspecified: Secondary | ICD-10-CM | POA: Diagnosis not present

## 2020-10-06 DIAGNOSIS — E041 Nontoxic single thyroid nodule: Secondary | ICD-10-CM | POA: Diagnosis not present

## 2020-10-29 ENCOUNTER — Other Ambulatory Visit: Payer: Self-pay

## 2020-10-29 ENCOUNTER — Encounter: Payer: Self-pay | Admitting: "Endocrinology

## 2020-10-29 ENCOUNTER — Ambulatory Visit (INDEPENDENT_AMBULATORY_CARE_PROVIDER_SITE_OTHER): Payer: Medicare HMO | Admitting: "Endocrinology

## 2020-10-29 VITALS — BP 128/92 | HR 100 | Ht 60.0 in

## 2020-10-29 DIAGNOSIS — E039 Hypothyroidism, unspecified: Secondary | ICD-10-CM

## 2020-10-29 DIAGNOSIS — E119 Type 2 diabetes mellitus without complications: Secondary | ICD-10-CM

## 2020-10-29 DIAGNOSIS — F17200 Nicotine dependence, unspecified, uncomplicated: Secondary | ICD-10-CM

## 2020-10-29 DIAGNOSIS — R69 Illness, unspecified: Secondary | ICD-10-CM | POA: Diagnosis not present

## 2020-10-29 DIAGNOSIS — E049 Nontoxic goiter, unspecified: Secondary | ICD-10-CM

## 2020-10-29 NOTE — Progress Notes (Signed)
Endocrinology Consult Note                                            10/29/2020, 6:46 PM   Subjective:    Patient ID: Kristin Wells, female    DOB: 1951/03/07, PCP Celene Squibb, MD   Past Medical History:  Diagnosis Date   Acute respiratory failure ([place]) 06/2011   Vent dependent sec to COPD/PNA   Anxiety    Anxiety disorder, unspecified    Arthritis    Arthritis    Atherosclerotic heart disease of native coronary artery with unspecified angina pectoris (HCC)    Back pain    Bronchitis    CHF (congestive heart failure) (HCC)    Chronic obstructive pulmonary disease with (acute) exacerbation (HCC)    Chronic obstructive pulmonary disease, unspecified ([place])    Chronic obstructive pulmonary disease, unspecified ([place])    COPD (chronic obstructive pulmonary disease) ([place])    Coronary artery disease    Demand ischemia of myocardium 06/28/2011   Depression    Diabetes mellitus without complication (HCC)    Dizziness and giddiness    Edema, unspecified    Hyperlipidemia    Hyperlipidemia, unspecified    Hypertension    Hypertensive heart disease without heart failure    Hypothyroidism, unspecified    Insomnia, unspecified    Ischemic cardiomyopathy 06/28/2011   EF 25%. Myoview stress test later showed ejection fraction of 65%   Lumbago    Major depressive disorder, single episode, unspecified    MI, acute, non ST segment elevation (HCC) 06/29/2011   Myoview stress test revealed mild perfusion defect   Myocardial infarction [facility])    Neuralgia and neuritis, unspecified    Nicotine dependence, unspecified, uncomplicated    Obstructive chronic bronchitis with acute bronchitis ([place])    Other acute osteomyelitis, unspecified ankle and foot (HCC)    Overactive bladder    Oxygen deficiency    PNA (pneumonia) 06/29/2011   Pneumonia    Pneumonia    Rectocele 10/09/2012   Substance abuse ([place])    MANY YEARS AGO   Symptomatic menopausal or female climacteric states     Thyroid enlargement 06/28/2011   Type 2 diabetes mellitus with diabetic peripheral angiopathy without gangrene (HCC)    Type 2 diabetes mellitus with hyperglycemia ([place])    Unspecified convulsions ([place])    Past Surgical History:  Procedure Laterality Date   COLONOSCOPY  Feb 2013   Dr. Michail Sermon: internal hemorrhoids, normal view of ileum, multiple hyperplastic polyps   ESOPHAGOGASTRODUODENOSCOPY  Feb 2013   Dr. Michail Sermon: normal duodenum, candida esophagitis, reactive gastropathy   TUBAL LIGATION     Social History   Socioeconomic History   Marital status: Single    Spouse name: Not on file   Number of children: Not on file   Years of education: Not on file   Highest education level: Not on file  Occupational History   Not on file  Tobacco Use   Smoking status: Every Day    Packs/day: 0.50    Years: 49.00    Pack years: 24.50    Types: Cigarettes    Start date: 05/10/1967   Smokeless tobacco: Never   Tobacco comments:    START 1969  Vaping Use   Vaping Use: Never used  Substance and Sexual Activity   Alcohol use: No  Alcohol/week: 0.0 standard drinks   Drug use: No   Sexual activity: Never    Birth control/protection: None  Other Topics Concern   Not on file  Social History Narrative   Not on file   Social Determinants of Health   Financial Resource Strain: Not on file  Food Insecurity: Not on file  Transportation Needs: Not on file  Physical Activity: Not on file  Stress: Not on file  Social Connections: Not on file   Family History  Problem Relation Age of Onset   Diabetes Mother    Hypertension Mother    Diabetes Father    Hypertension Father    Heart disease Father        HEART FAILURE   Skin cancer Brother        [place] type   Alcohol abuse Brother    Seizures Sister    Uterine cancer Sister    Early death Sister        Pneumonia at 56 months old   Early death Son    Drug abuse Son    Heart attack Son    Cancer Maternal Uncle    Cirrhosis  Brother    Colon cancer Neg Hx    Outpatient Encounter Medications as of 10/29/2020  Medication Sig   ergocalciferol (VITAMIN D2) 1.25 MG (50000 UT) capsule Take 50,000 Units by mouth once a week.   albuterol (PROVENTIL) (2.5 MG/3ML) 0.083% nebulizer solution Take 3 mLs (2.5 mg total) by nebulization every 6 (six) hours as needed for wheezing or shortness of breath. (Patient not taking: Reported on 10/29/2020)   aspirin EC 81 MG tablet Take 81 mg by mouth daily.   blood glucose meter kit and supplies KIT Dispense based on patient and insurance preference. Use up to four times daily as directed. (FOR ICD-9 250.00, 250.01).   BREZTRI AEROSPHERE 160-9-4.8 MCG/ACT AERO Inhale into the lungs.   cetirizine HCl (ZYRTEC) 1 MG/ML solution Take 10 mLs (10 mg total) by mouth daily.   cyclobenzaprine (FLEXERIL) 10 MG tablet Take 10 mg by mouth 2 (two) times daily. (Patient not taking: Reported on 10/29/2020)   DULoxetine (CYMBALTA) 20 MG capsule TAKE 1 CAPSULE BY MOUTH EVERY DAY. (Patient not taking: Reported on 10/29/2020)   fluticasone furoate-vilanterol (BREO ELLIPTA) 100-25 MCG/INH AEPB Inhale 1 puff into the lungs daily. (Patient not taking: Reported on 10/29/2020)   furosemide (LASIX) 20 MG tablet Take 1 tablet (20 mg total) by mouth daily.   GLUCERNA (GLUCERNA) LIQD Take 237 mLs by mouth 3 (three) times daily between meals.   guaiFENesin (MUCINEX) 600 MG 12 hr tablet Take 1 tablet (600 mg total) by mouth 2 (two) times daily.   Incontinence Supply Disposable (POISE PANTILINERS) PADS 1 each by Does not apply route as needed.   levothyroxine (SYNTHROID, LEVOTHROID) 25 MCG tablet Take 50 mcg by mouth daily.   loratadine (CLARITIN) 10 MG tablet Take 10 mg by mouth daily.   metFORMIN (GLUCOPHAGE) 500 MG tablet Take 500 mg by mouth 2 (two) times daily with a meal.    OXYGEN Inhale 2 L into the lungs daily as needed.   pantoprazole (PROTONIX) 40 MG tablet Take 40 mg by mouth daily.   potassium chloride (K-DUR)  10 MEQ tablet Take 1 tablet (10 mEq total) by mouth daily.   PROAIR HFA 108 (90 Base) MCG/ACT inhaler INHALE 2 PUFFS BY MOUTH EVERY 6 HOURS AS NEEDED FOR WHEEZING OR SHORTNESS OF BREATH. (Patient not taking: Reported on 10/29/2020)  rosuvastatin (CRESTOR) 10 MG tablet Take 10 mg by mouth daily at 6 PM.   [DISCONTINUED] Aclidinium Bromide (TUDORZA PRESSAIR) 400 MCG/ACT AEPB Inhale 2 puffs into the lungs 2 (two) times daily as needed.    No facility-administered encounter medications on file as of 10/29/2020.   ALLERGIES: No Known Allergies  VACCINATION STATUS: Immunization History  Administered Date(s) Administered   Influenza Inj Mdck Quad Pf 04/29/2016   Influenza Split 06/29/2011   Influenza,inj,Quad PF,6+ Mos 01/30/2013, 05/25/2017   Influenza-Unspecified 02/20/2014, 02/17/2015, 04/29/2016, 04/01/2019   PPD Test 08/29/2012   Pneumococcal Conjugate-13 05/25/2017   Pneumococcal Polysaccharide-23 06/29/2011, 04/01/2019    HPI EKTA DANCER is 70 y.o. female who presents today with a medical history as above. she is being seen in consultation for hypothyroidism, goiter requested by Celene Squibb, MD.  Patient is not optimal historian.  History is obtained by chart review and directly interviewing the patient.  She was diagnosed with hypothyroidism for approximately 5 years.  She was on levothyroxine 25 mcg p.o. daily until recently when it was increased to 50 mcg p.o. daily when her TSH was 4.99 on August 19, 2020.  She also has clinical goiter, no record of thyroid imaging. She is a chronic smoker.  She reports fatigue.  She has well-controlled diabetes, hypertension, hyperlipidemia. Her medical history is complicated by coronary artery disease, COPD, stage 2 renal insufficiency, Major depression. She reports significantly fluctuating body weight.  Shortness of breath on exertion.  she denies dysphagia, nor voice change.   She has cervical lymphadenopathy.   Review of  Systems  Constitutional: + Fluctuating body weight, + fatigue, no subjective hyperthermia, no subjective hypothermia Eyes: no blurry vision, no xerophthalmia ENT: no sore throat, no nodules palpated in throat, no dysphagia/odynophagia, no hoarseness Cardiovascular: no Chest Pain, no Shortness of Breath, no palpitations, no leg swelling Respiratory: +cough, + exertional shortness of breath Gastrointestinal: no Nausea/Vomiting/Diarhhea Musculoskeletal: no muscle/joint aches Skin: no rashes Neurological: no tremors, no numbness, no tingling, no dizziness Psychiatric: no depression, no anxiety  Objective:    Vitals with BMI 10/29/2020 09/16/2020 08/22/2017  Height $Remov'5\' 0"'jOueqC$  - $'5\' 3"'W$   Weight - - 143 lbs 1 oz  BMI - - 72.90  Systolic 211 155 208  Diastolic 92 72 80  Pulse 022 99 97    BP (!) 128/92   Pulse 100   Ht 5' (1.524 m)   BMI 27.94 kg/m   Wt Readings from Last 3 Encounters:  08/22/17 143 lb 0.6 oz (64.9 kg)  05/25/17 145 lb 0.6 oz (65.8 kg)  05/13/17 136 lb (61.7 kg)    Physical Exam  Constitutional:  Body mass index is 27.94 kg/m.,  not in acute distress, normal state of mind Eyes: PERRLA, EOMI, no exophthalmos ENT: moist mucous membranes, + gross thyromegaly, + gross cervical lymphadenopathy Cardiovascular: normal precordial activity, Regular Rate and Rhythm, no Murmur/Rubs/Gallops Respiratory:  adequate breathing efforts, no gross chest deformity, + wheezes bilaterally  Musculoskeletal: no gross deformities, strength intact in all four extremities Skin: moist, warm, no rashes Neurological: no tremor with outstretched hands, Deep tendon reflexes normal in bilateral lower extremities.  CMP ( most recent) CMP     Component Value Date/Time   NA 139 08/22/2017 1415   K 3.8 08/22/2017 1415   CL 99 08/22/2017 1415   CO2 34 (H) 08/22/2017 1415   GLUCOSE 91 08/22/2017 1415   BUN 10 08/19/2020 0000   CREATININE 1.1 08/19/2020 0000   CREATININE 0.97 08/22/2017 1415  CALCIUM 9.2 08/19/2020 0000   PROT 6.4 08/22/2017 1415   ALBUMIN 4.2 08/19/2020 0000   AST 9 (L) 08/22/2017 1415   ALT 11 08/22/2017 1415   ALKPHOS 66 01/31/2017 0457   BILITOT 0.4 08/22/2017 1415   GFRNONAA 61 08/22/2017 1415   GFRAA 71 08/22/2017 1415     Diabetic Labs (most recent): Lab Results  Component Value Date   HGBA1C 5.9 08/19/2020   HGBA1C 5.8 (H) 08/22/2017   HGBA1C 5.8 (H) 01/31/2017     Lipid Panel ( most recent) Lipid Panel     Component Value Date/Time   CHOL 143 08/19/2020 0000   TRIG 125 08/19/2020 0000   HDL 46 08/19/2020 0000   CHOLHDL 2.4 08/22/2017 1415   VLDL 22 08/19/2016 1435   LDLCALC 75 08/19/2020 0000   LDLCALC 57 08/22/2017 1415      Lab Results  Component Value Date   TSH 4.99 08/19/2020   TSH 4.54 (H) 08/22/2017   TSH 2.83 08/19/2016   TSH 4.65 06/19/2013   TSH 3.199 10/04/2012   TSH 1.325 06/28/2011   TSH 0.982 01/09/2010   TSH 0.982 01/09/2010   FREET4 1.00 06/28/2011      Assessment & Plan:   1. Acquired hypothyroidism   - LOVENA KLUCK  is being seen at a kind request of [place] Crane, Edwinna Areola, MD. - I have reviewed her available thyroid records and clinically evaluated the patient. - Based on these reviews, she has hypothyroidism on levothyroxine 50 mcg p.o. daily, clinical goiter,  however,  there is not sufficient information to proceed with definitive treatment plan. -She is advised to remain on her recently adjusted dose of levothyroxine 50 mcg p.o. daily before breakfast until next measurement.   - We discussed about the correct intake of her thyroid hormone, on empty stomach at fasting, with water, separated by at least 30 minutes from breakfast and other medications,  and separated by more than 4 hours from calcium, iron, multivitamins, acid reflux medications (PPIs). -Patient is made aware of the fact that thyroid hormone replacement is needed for life, dose to be adjusted by periodic monitoring of thyroid function  tests.  Regarding her clinical goiter and significant fibrofatty: She is off her baseline thyroid/neck ultrasound. If she is found to have significant nodular goiter, she will be considered for fine-needle aspiration.    The patient was counseled on the dangers of tobacco use, and was advised to quit.  Reviewed strategies to maximize success, including removing cigarettes and smoking materials from environment.  She has well-controlled type 2 diabetes with A1c of 5.9%, will not need any medications for diabetes at this time.  - I did not initiate any new prescriptions today. - she is advised to maintain close follow up with Celene Squibb, MD for primary care needs.   - Time spent with the patient: 60 minutes, of which >50% was spent in  counseling her about her hypothyroidism, goiter, type 2 diabetes and the rest in obtaining information about her symptoms, reviewing her previous labs/studies ( including abstractions from other facilities),  evaluations, and treatments,  and developing a plan to confirm diagnosis and long term treatment based on the latest standards of care/guidelines; and documenting her care.  Kristin Wells participated in the discussions, expressed understanding, and voiced agreement with the above plans.  All questions were answered to her satisfaction. she is encouraged to contact clinic should she have any questions or concerns prior to her return visit.  Follow  up plan: Return in about 7 weeks (around 12/17/2020) for F/U with Pre-visit Labs, Thyroid / Neck Ultrasound.   Glade Lloyd, MD [facility] [address] [place], [place] [address] Phone: [phone]  Fax: [phone]     10/29/2020, 6:46 PM  This note was partially dictated with voice recognition software. Similar sounding words can be transcribed inadequately or may not  be corrected upon review.

## 2020-11-03 DIAGNOSIS — J9690 Respiratory failure, unspecified, unspecified whether with hypoxia or hypercapnia: Secondary | ICD-10-CM | POA: Diagnosis not present

## 2020-11-03 DIAGNOSIS — M47897 Other spondylosis, lumbosacral region: Secondary | ICD-10-CM | POA: Diagnosis not present

## 2020-11-03 DIAGNOSIS — J449 Chronic obstructive pulmonary disease, unspecified: Secondary | ICD-10-CM | POA: Diagnosis not present

## 2020-11-03 DIAGNOSIS — G4089 Other seizures: Secondary | ICD-10-CM | POA: Diagnosis not present

## 2020-11-05 ENCOUNTER — Ambulatory Visit (HOSPITAL_COMMUNITY): Admission: RE | Admit: 2020-11-05 | Payer: Medicare HMO | Source: Ambulatory Visit

## 2020-12-03 DIAGNOSIS — G4089 Other seizures: Secondary | ICD-10-CM | POA: Diagnosis not present

## 2020-12-03 DIAGNOSIS — J9690 Respiratory failure, unspecified, unspecified whether with hypoxia or hypercapnia: Secondary | ICD-10-CM | POA: Diagnosis not present

## 2020-12-03 DIAGNOSIS — M47897 Other spondylosis, lumbosacral region: Secondary | ICD-10-CM | POA: Diagnosis not present

## 2020-12-03 DIAGNOSIS — J449 Chronic obstructive pulmonary disease, unspecified: Secondary | ICD-10-CM | POA: Diagnosis not present

## 2020-12-23 ENCOUNTER — Ambulatory Visit: Payer: Medicare HMO | Admitting: "Endocrinology

## 2021-01-03 DIAGNOSIS — J449 Chronic obstructive pulmonary disease, unspecified: Secondary | ICD-10-CM | POA: Diagnosis not present

## 2021-01-03 DIAGNOSIS — J9690 Respiratory failure, unspecified, unspecified whether with hypoxia or hypercapnia: Secondary | ICD-10-CM | POA: Diagnosis not present

## 2021-01-03 DIAGNOSIS — G4089 Other seizures: Secondary | ICD-10-CM | POA: Diagnosis not present

## 2021-01-03 DIAGNOSIS — M47897 Other spondylosis, lumbosacral region: Secondary | ICD-10-CM | POA: Diagnosis not present

## 2021-02-01 ENCOUNTER — Ambulatory Visit (HOSPITAL_COMMUNITY)
Admission: RE | Admit: 2021-02-01 | Discharge: 2021-02-01 | Disposition: A | Discharge status: Home / Self Care | Payer: Medicare HMO | Source: Ambulatory Visit | Attending: "Endocrinology | Admitting: "Endocrinology

## 2021-02-01 ENCOUNTER — Other Ambulatory Visit: Payer: Self-pay

## 2021-02-01 DIAGNOSIS — E041 Nontoxic single thyroid nodule: Secondary | ICD-10-CM | POA: Diagnosis not present

## 2021-02-01 DIAGNOSIS — E039 Hypothyroidism, unspecified: Secondary | ICD-10-CM | POA: Insufficient documentation

## 2021-02-03 DIAGNOSIS — M47897 Other spondylosis, lumbosacral region: Secondary | ICD-10-CM | POA: Diagnosis not present

## 2021-02-03 DIAGNOSIS — J449 Chronic obstructive pulmonary disease, unspecified: Secondary | ICD-10-CM | POA: Diagnosis not present

## 2021-02-03 DIAGNOSIS — G4089 Other seizures: Secondary | ICD-10-CM | POA: Diagnosis not present

## 2021-02-03 DIAGNOSIS — J9690 Respiratory failure, unspecified, unspecified whether with hypoxia or hypercapnia: Secondary | ICD-10-CM | POA: Diagnosis not present

## 2021-02-19 ENCOUNTER — Other Ambulatory Visit (HOSPITAL_COMMUNITY)
Admission: RE | Admit: 2021-02-19 | Discharge: 2021-02-19 | Disposition: A | Discharge status: Home / Self Care | Payer: Medicare HMO | Source: Ambulatory Visit | Attending: "Endocrinology | Admitting: "Endocrinology

## 2021-02-19 ENCOUNTER — Other Ambulatory Visit: Payer: Self-pay

## 2021-02-19 DIAGNOSIS — E039 Hypothyroidism, unspecified: Secondary | ICD-10-CM | POA: Diagnosis not present

## 2021-02-19 LAB — TSH: TSH: 6.595 u[IU]/mL — ABNORMAL HIGH (ref 0.350–4.500)

## 2021-02-19 LAB — T4, FREE: Free T4: 1.14 ng/dL — ABNORMAL HIGH (ref 0.61–1.12)

## 2021-02-20 LAB — THYROID PEROXIDASE ANTIBODY: Thyroperoxidase Ab SerPl-aCnc: 594 IU/mL — ABNORMAL HIGH (ref 0–34)

## 2021-02-20 LAB — THYROGLOBULIN ANTIBODY: Thyroglobulin Antibody: >2250 IU/mL — AB (ref 0.0–0.9)

## 2021-03-03 ENCOUNTER — Encounter (HOSPITAL_COMMUNITY): Payer: Self-pay | Admitting: Radiology

## 2021-03-03 ENCOUNTER — Ambulatory Visit (INDEPENDENT_AMBULATORY_CARE_PROVIDER_SITE_OTHER): Payer: Medicare HMO | Admitting: "Endocrinology

## 2021-03-03 ENCOUNTER — Encounter: Payer: Self-pay | Admitting: "Endocrinology

## 2021-03-03 VITALS — BP 128/82 | HR 88 | Wt 137.6 lb

## 2021-03-03 DIAGNOSIS — E038 Other specified hypothyroidism: Secondary | ICD-10-CM | POA: Diagnosis not present

## 2021-03-03 DIAGNOSIS — E041 Nontoxic single thyroid nodule: Secondary | ICD-10-CM | POA: Insufficient documentation

## 2021-03-03 DIAGNOSIS — E063 Autoimmune thyroiditis: Secondary | ICD-10-CM | POA: Diagnosis not present

## 2021-03-03 MED ORDER — LEVOTHYROXINE SODIUM 50 MCG PO TABS: 50.0000 ug | ORAL_TABLET | Freq: Every day | ORAL | 1 refills | Status: DC

## 2021-03-03 NOTE — Progress Notes (Signed)
03/03/2021, 5:19 PM  Endocrinology follow-up note   Subjective:    Patient ID: Kristin Wells, female    DOB: 06-22-1950, PCP Celene Squibb, MD   Past Medical History:  Diagnosis Date   Acute respiratory failure ([place]) 06/2011   Vent dependent sec to COPD/PNA   Anxiety    Anxiety disorder, unspecified    Arthritis    Arthritis    Atherosclerotic heart disease of native coronary artery with unspecified angina pectoris (HCC)    Back pain    Bronchitis    CHF (congestive heart failure) ([place])    Chronic obstructive pulmonary disease with (acute) exacerbation (HCC)    Chronic obstructive pulmonary disease, unspecified ([place])    Chronic obstructive pulmonary disease, unspecified ([place])    COPD (chronic obstructive pulmonary disease) ([place])    Coronary artery disease    Demand ischemia of myocardium 06/28/2011   Depression    Diabetes mellitus without complication (HCC)    Dizziness and giddiness    Edema, unspecified    Hyperlipidemia    Hyperlipidemia, unspecified    Hypertension    Hypertensive heart disease without heart failure    Hypothyroidism, unspecified    Insomnia, unspecified    Ischemic cardiomyopathy 06/28/2011   EF 25%. Myoview stress test later showed ejection fraction of 65%   Lumbago    Major depressive disorder, single episode, unspecified    MI, acute, non ST segment elevation (HCC) 06/29/2011   Myoview stress test revealed mild perfusion defect   Myocardial infarction [facility])    Neuralgia and neuritis, unspecified    Nicotine dependence, unspecified, uncomplicated    Obstructive chronic bronchitis with acute bronchitis ([place])    Other acute osteomyelitis, unspecified ankle and foot (HCC)    Overactive bladder    Oxygen deficiency    PNA (pneumonia) 06/29/2011   Pneumonia    Pneumonia    Rectocele 10/09/2012   Substance abuse ([place])    MANY YEARS AGO   Symptomatic menopausal or female climacteric states     Thyroid enlargement 06/28/2011   Type 2 diabetes mellitus with diabetic peripheral angiopathy without gangrene (HCC)    Type 2 diabetes mellitus with hyperglycemia ([place])    Unspecified convulsions ([place])    Past Surgical History:  Procedure Laterality Date   COLONOSCOPY  Feb 2013   Dr. Michail Sermon: internal hemorrhoids, normal view of ileum, multiple hyperplastic polyps   ESOPHAGOGASTRODUODENOSCOPY  Feb 2013   Dr. Michail Sermon: normal duodenum, candida esophagitis, reactive gastropathy   TUBAL LIGATION     Social History   Socioeconomic History   Marital status: Single    Spouse name: Not on file   Number of children: Not on file   Years of education: Not on file   Highest education level: Not on file  Occupational History   Not on file  Tobacco Use   Smoking status: Every Day    Packs/day: 0.50    Years: 49.00    Pack years: 24.50    Types: Cigarettes    Start date: 05/10/1967   Smokeless tobacco: Never   Tobacco comments:    START 1969  Vaping Use   Vaping Use: Never used  Substance and Sexual Activity   Alcohol  use: No    Alcohol/week: 0.0 standard drinks   Drug use: No   Sexual activity: Never    Birth control/protection: None  Other Topics Concern   Not on file  Social History Narrative   Not on file   Social Determinants of Health   Financial Resource Strain: Not on file  Food Insecurity: Not on file  Transportation Needs: Not on file  Physical Activity: Not on file  Stress: Not on file  Social Connections: Not on file   Family History  Problem Relation Age of Onset   Diabetes Mother    Hypertension Mother    Diabetes Father    Hypertension Father    Heart disease Father        HEART FAILURE   Skin cancer Brother        [place] type   Alcohol abuse Brother    Seizures Sister    Uterine cancer Sister    Early death Sister        Pneumonia at 61 months old   Early death Son    Drug abuse Son    Heart attack Son    Cancer Maternal Uncle    Cirrhosis  Brother    Colon cancer Neg Hx    Outpatient Encounter Medications as of 03/03/2021  Medication Sig   cyclobenzaprine (FLEXERIL) 10 MG tablet Take 10 mg by mouth 2 (two) times daily.   DULoxetine (CYMBALTA) 20 MG capsule TAKE 1 CAPSULE BY MOUTH EVERY DAY.   albuterol (PROVENTIL) (2.5 MG/3ML) 0.083% nebulizer solution Take 3 mLs (2.5 mg total) by nebulization every 6 (six) hours as needed for wheezing or shortness of breath. (Patient not taking: Reported on 10/29/2020)   aspirin EC 81 MG tablet Take 81 mg by mouth daily.   blood glucose meter kit and supplies KIT Dispense based on patient and insurance preference. Use up to four times daily as directed. (FOR ICD-9 250.00, 250.01).   BREZTRI AEROSPHERE 160-9-4.8 MCG/ACT AERO Inhale into the lungs.   cetirizine HCl (ZYRTEC) 1 MG/ML solution Take 10 mLs (10 mg total) by mouth daily.   ergocalciferol (VITAMIN D2) 1.25 MG (50000 UT) capsule Take 50,000 Units by mouth once a week. (Patient not taking: Reported on 03/03/2021)   furosemide (LASIX) 20 MG tablet Take 1 tablet (20 mg total) by mouth daily.   GLUCERNA (GLUCERNA) LIQD Take 237 mLs by mouth 3 (three) times daily between meals.   guaiFENesin (MUCINEX) 600 MG 12 hr tablet Take 1 tablet (600 mg total) by mouth 2 (two) times daily.   Incontinence Supply Disposable (POISE PANTILINERS) PADS 1 each by Does not apply route as needed.   levothyroxine (SYNTHROID) 50 MCG tablet Take 1 tablet (50 mcg total) by mouth daily.   loratadine (CLARITIN) 10 MG tablet Take 10 mg by mouth daily.   metFORMIN (GLUCOPHAGE) 500 MG tablet Take 500 mg by mouth 2 (two) times daily with a meal.    OXYGEN Inhale 2 L into the lungs daily as needed.   pantoprazole (PROTONIX) 40 MG tablet Take 40 mg by mouth daily.   potassium chloride (K-DUR) 10 MEQ tablet Take 1 tablet (10 mEq total) by mouth daily.   rosuvastatin (CRESTOR) 10 MG tablet Take 10 mg by mouth daily at 6 PM.   [DISCONTINUED] fluticasone furoate-vilanterol  (BREO ELLIPTA) 100-25 MCG/INH AEPB Inhale 1 puff into the lungs daily. (Patient not taking: Reported on 10/29/2020)   [DISCONTINUED] levothyroxine (SYNTHROID, LEVOTHROID) 25 MCG tablet Take 50 mcg by mouth daily.   [  DISCONTINUED] PROAIR HFA 108 (90 Base) MCG/ACT inhaler INHALE 2 PUFFS BY MOUTH EVERY 6 HOURS AS NEEDED FOR WHEEZING OR SHORTNESS OF BREATH. (Patient not taking: Reported on 10/29/2020)   No facility-administered encounter medications on file as of 03/03/2021.   ALLERGIES: No Known Allergies  VACCINATION STATUS: Immunization History  Administered Date(s) Administered   Influenza Inj Mdck Quad Pf 04/29/2016   Influenza Split 06/29/2011   Influenza,inj,Quad PF,6+ Mos 01/30/2013, 05/25/2017   Influenza-Unspecified 02/20/2014, 02/17/2015, 04/29/2016, 04/01/2019   PPD Test 08/29/2012   Pneumococcal Conjugate-13 05/25/2017   Pneumococcal Polysaccharide-23 06/29/2011, 04/01/2019    HPI FAREEDA DOWNARD is 70 y.o. female who presents today with a medical history as above. she was seen in June 2022 for hypothyroidism and found to have goiter.  She was supposed to return in August with thyroid ultrasound, did not return for unclear reasons.    PMD: Celene Squibb, MD.  Patient is not optimal historian.  History is obtained by chart review and directly interviewing the patient.  She was diagnosed with hypothyroidism for approximately 5 years.  Her levothyroxine was adjusted at 50 mcg p.o. daily at breakfast during her last visit.  She admits to inconsistency taking her medication.  Her previsit labs are inconclusive with high TSH of 6.5 and slightly high free T4 1.14.  However, her previsit thyroid ultrasound is significant for suspicious nodule on the right lobe of her thyroid.   She is a chronic smoker.  She reports fatigue.  She has well-controlled diabetes, hypertension, hyperlipidemia. Her medical history is complicated by coronary artery disease, COPD, stage 2 renal  insufficiency, Major depression. She reports significantly fluctuating body weight.  Shortness of breath on exertion.  she denies dysphagia, nor voice change.   She has cervical lymphadenopathy.   Review of Systems  Constitutional: + Mildly fluctuating body weight, + fatigue, no subjective hyperthermia, no subjective hypothermia Eyes: no blurry vision, no xerophthalmia ENT: no sore throat, no nodules palpated in throat, no dysphagia/odynophagia, no hoarseness Cardiovascular: no Chest Pain, no Shortness of Breath, no palpitations, no leg swelling Respiratory: +cough, + exertional shortness of breath Gastrointestinal: no Nausea/Vomiting/Diarhhea Musculoskeletal: no muscle/joint aches Skin: no rashes Neurological: no tremors, no numbness, no tingling, no dizziness Psychiatric: no depression, no anxiety  Objective:    Vitals with BMI 03/03/2021 10/29/2020 09/16/2020  Height - _0  -  Weight 137 lbs 10 oz - -  BMI - - -  Systolic 163 846 659  Diastolic 82 92 72  Pulse 88 100 99    BP 128/82   Pulse 88   Wt 137 lb 9.6 oz (62.4 kg)   BMI 26.87 kg/m   Wt Readings from Last 3 Encounters:  03/03/21 137 lb 9.6 oz (62.4 kg)  08/22/17 143 lb 0.6 oz (64.9 kg)  05/25/17 145 lb 0.6 oz (65.8 kg)    Physical Exam  Constitutional:  Body mass index is 26.87 kg/m.,  not in acute distress, normal state of mind Eyes: PERRLA, EOMI, no exophthalmos ENT: moist mucous membranes, + gross thyromegaly, + gross cervical lymphadenopathy Cardiovascular: normal precordial activity, Regular Rate and Rhythm, no Murmur/Rubs/Gallops Respiratory:  adequate breathing efforts, no gross chest deformity, + wheezes bilaterally  Musculoskeletal: no gross deformities, strength intact in all four extremities Skin: moist, warm, no rashes Neurological: no tremor with outstretched hands, Deep tendon reflexes normal in bilateral lower extremities.  CMP ( most recent) CMP     Component Value Date/Time   NA 139  08/22/2017 1415  K 3.8 08/22/2017 1415   CL 99 08/22/2017 1415   CO2 34 (H) 08/22/2017 1415   GLUCOSE 91 08/22/2017 1415   BUN 10 08/19/2020 0000   CREATININE 1.1 08/19/2020 0000   CREATININE 0.97 08/22/2017 1415   CALCIUM 9.2 08/19/2020 0000   PROT 6.4 08/22/2017 1415   ALBUMIN 4.2 08/19/2020 0000   AST 9 (L) 08/22/2017 1415   ALT 11 08/22/2017 1415   ALKPHOS 66 01/31/2017 0457   BILITOT 0.4 08/22/2017 1415   GFRNONAA 61 08/22/2017 1415   GFRAA 71 08/22/2017 1415     Diabetic Labs (most recent): Lab Results  Component Value Date   HGBA1C 5.9 08/19/2020   HGBA1C 5.8 (H) 08/22/2017   HGBA1C 5.8 (H) 01/31/2017     Lipid Panel ( most recent) Lipid Panel     Component Value Date/Time   CHOL 143 08/19/2020 0000   TRIG 125 08/19/2020 0000   HDL 46 08/19/2020 0000   CHOLHDL 2.4 08/22/2017 1415   VLDL 22 08/19/2016 1435   LDLCALC 75 08/19/2020 0000   LDLCALC 57 08/22/2017 1415      Lab Results  Component Value Date   TSH 6.595 (H) 02/19/2021   TSH 4.99 08/19/2020   TSH 4.54 (H) 08/22/2017   TSH 2.83 08/19/2016   TSH 4.65 06/19/2013   TSH 3.199 10/04/2012   TSH 1.325 06/28/2011   TSH 0.982 01/09/2010   TSH 0.982 01/09/2010   FREET4 1.14 (H) 02/19/2021   FREET4 1.00 06/28/2011    Thyroid ultrasound on February 01, 2021 IMPRESSION: Solitary right superior thyroid nodule (labeled 1, 3.9 cm) which meets criteria (TI-RADS category 5) for tissue sampling. Recommend ultrasound-guided fine-needle aspiration.     Assessment & Plan:   1. Hypothyroidism 2.  Hashimoto's thyroiditis 3.  Solitary thyroid nodule in the right lobe Her thyroid function tests are consistent with Hashimoto's thyroiditis causing clinical hypothyroidism.  She is not consistent taking her thyroid hormone. She is advised to maintain her current dose of levothyroxine 50 mcg p.o. daily before breakfast.  - We discussed about the correct intake of her thyroid hormone, on empty stomach at  fasting, with water, separated by at least 30 minutes from breakfast and other medications,  and separated by more than 4 hours from calcium, iron, multivitamins, acid reflux medications (PPIs). -Patient is made aware of the fact that thyroid hormone replacement is needed for life, dose to be adjusted by periodic monitoring of thyroid function tests.   Regarding her solitary, suspicious right lobe thyroid nodule, she is approached for fine-needle aspiration biopsy.  This procedure will be scheduled to be done at [facility] radiology.   She will return in 2 weeks with her biopsy results.  Significant Hashimoto's thyroiditis, and her heavy smoking history, makes it necessary for tissue biopsy in suspicious thyroid nodule.  The patient was counseled on the dangers of tobacco use, and was advised to quit.  Reviewed strategies to maximize success, including removing cigarettes and smoking materials from environment.   She has well-controlled type 2 diabetes with A1c of 5.9%, will not need any medications for diabetes at this time.  - I did not initiate any new prescriptions today. - she is advised to maintain close follow up with Celene Squibb, MD for primary care needs.   I spent 35 minutes in the care of the patient today including review of labs from Thyroid Function, CMP, and other relevant labs ; imaging/biopsy records (current and previous including abstractions from other facilities);  face-to-face time discussing  her lab results and symptoms, medications doses, her options of short and long term treatment based on the latest standards of care / guidelines;   and documenting the encounter.  Kristin Wells  participated in the discussions, expressed understanding, and voiced agreement with the above plans.  All questions were answered to her satisfaction. she is encouraged to contact clinic should she have any questions or concerns prior to her return visit.   Follow up plan: Return  in about 2 weeks (around 03/17/2021) for F/U with Biopsy Results.   Glade Lloyd, MD [facility] [address] [place], [place] [address] Phone: [phone]  Fax: [phone]     03/03/2021, 5:19 PM  This note was partially dictated with voice recognition software. Similar sounding words can be transcribed inadequately or may not  be corrected upon review.

## 2021-03-05 DIAGNOSIS — J449 Chronic obstructive pulmonary disease, unspecified: Secondary | ICD-10-CM | POA: Diagnosis not present

## 2021-03-05 DIAGNOSIS — G4089 Other seizures: Secondary | ICD-10-CM | POA: Diagnosis not present

## 2021-03-05 DIAGNOSIS — J9690 Respiratory failure, unspecified, unspecified whether with hypoxia or hypercapnia: Secondary | ICD-10-CM | POA: Diagnosis not present

## 2021-03-05 DIAGNOSIS — E041 Nontoxic single thyroid nodule: Secondary | ICD-10-CM | POA: Diagnosis not present

## 2021-03-05 DIAGNOSIS — M47897 Other spondylosis, lumbosacral region: Secondary | ICD-10-CM | POA: Diagnosis not present

## 2021-03-17 ENCOUNTER — Other Ambulatory Visit: Payer: Self-pay

## 2021-03-17 ENCOUNTER — Encounter (HOSPITAL_COMMUNITY): Payer: Self-pay

## 2021-03-17 ENCOUNTER — Ambulatory Visit (HOSPITAL_COMMUNITY)
Admission: RE | Admit: 2021-03-17 | Discharge: 2021-03-17 | Disposition: A | Discharge status: Home / Self Care | Payer: Medicare HMO | Source: Ambulatory Visit | Attending: "Endocrinology | Admitting: "Endocrinology

## 2021-03-17 DIAGNOSIS — E041 Nontoxic single thyroid nodule: Secondary | ICD-10-CM | POA: Insufficient documentation

## 2021-03-17 DIAGNOSIS — C73 Malignant neoplasm of thyroid gland: Secondary | ICD-10-CM | POA: Diagnosis not present

## 2021-03-17 MED ORDER — LIDOCAINE HCL (PF) 2 % IJ SOLN
INTRAMUSCULAR | Status: AC
  Administered 2021-03-17: 10 mL
  Filled 2021-03-17: qty 10

## 2021-03-17 NOTE — Progress Notes (Signed)
PT tolerated thyroid biopsy procedure well today. Labs obtained and sent for pathology. PT ambulatory at discharge with no acute distress noted and verbalized understanding of discharge instructions.

## 2021-03-18 ENCOUNTER — Ambulatory Visit: Payer: Medicare HMO | Admitting: "Endocrinology

## 2021-03-18 ENCOUNTER — Telehealth: Payer: Self-pay | Admitting: "Endocrinology

## 2021-03-18 NOTE — Telephone Encounter (Signed)
Patient asked [place] to call Helmut Muster her transportation for appts [phone]

## 2021-03-21 LAB — CYTOLOGY - NON PAP

## 2021-03-25 ENCOUNTER — Telehealth: Payer: Self-pay | Admitting: "Endocrinology

## 2021-03-25 NOTE — Telephone Encounter (Signed)
Pt coming tomorrow for OV

## 2021-03-25 NOTE — Telephone Encounter (Signed)
Per Dr Dorris Fetch, patients biopsy did not go to [facility] and he needs to see her back in the office ASAP. I tried to call patient and her phone is not working. I called her emergency contact, Leanna and left her a VM to call us back to set this up.

## 2021-03-26 ENCOUNTER — Encounter: Payer: Self-pay | Admitting: "Endocrinology

## 2021-03-26 ENCOUNTER — Other Ambulatory Visit: Payer: Self-pay

## 2021-03-26 ENCOUNTER — Ambulatory Visit (INDEPENDENT_AMBULATORY_CARE_PROVIDER_SITE_OTHER): Payer: Medicare HMO | Admitting: "Endocrinology

## 2021-03-26 VITALS — BP 161/99 | HR 101 | Ht 60.0 in | Wt 135.8 lb

## 2021-03-26 DIAGNOSIS — E041 Nontoxic single thyroid nodule: Secondary | ICD-10-CM | POA: Diagnosis not present

## 2021-03-26 DIAGNOSIS — E063 Autoimmune thyroiditis: Secondary | ICD-10-CM | POA: Diagnosis not present

## 2021-03-26 DIAGNOSIS — C73 Malignant neoplasm of thyroid gland: Secondary | ICD-10-CM

## 2021-03-26 DIAGNOSIS — E038 Other specified hypothyroidism: Secondary | ICD-10-CM | POA: Diagnosis not present

## 2021-03-26 DIAGNOSIS — R69 Illness, unspecified: Secondary | ICD-10-CM | POA: Diagnosis not present

## 2021-03-26 DIAGNOSIS — F17200 Nicotine dependence, unspecified, uncomplicated: Secondary | ICD-10-CM

## 2021-03-26 HISTORY — DX: Malignant neoplasm of thyroid gland: C73

## 2021-03-26 NOTE — Progress Notes (Signed)
03/26/2021, 8:23 PM  Endocrinology follow-up note   Subjective:    Patient ID: Kristin Wells, female    DOB: [birth year], PCP Celene Squibb, MD   Past Medical History:  Diagnosis Date   Acute respiratory failure ([place]) 06/2011   Vent dependent sec to COPD/PNA   Anxiety    Anxiety disorder, unspecified    Arthritis    Arthritis    Atherosclerotic heart disease of native coronary artery with unspecified angina pectoris (HCC)    Back pain    Bronchitis    CHF (congestive heart failure) ([place])    Chronic obstructive pulmonary disease with (acute) exacerbation (HCC)    Chronic obstructive pulmonary disease, unspecified ([place])    Chronic obstructive pulmonary disease, unspecified ([place])    COPD (chronic obstructive pulmonary disease) ([place])    Coronary artery disease    Demand ischemia of myocardium 06/28/2011   Depression    Diabetes mellitus without complication (HCC)    Dizziness and giddiness    Edema, unspecified    Hyperlipidemia    Hyperlipidemia, unspecified    Hypertension    Hypertensive heart disease without heart failure    Hypothyroidism, unspecified    Insomnia, unspecified    Ischemic cardiomyopathy 06/28/2011   EF 25%. Myoview stress test later showed ejection fraction of 65%   Lumbago    Major depressive disorder, single episode, unspecified    MI, acute, non ST segment elevation (HCC) 06/29/2011   Myoview stress test revealed mild perfusion defect   Myocardial infarction [facility])    Neuralgia and neuritis, unspecified    Nicotine dependence, unspecified, uncomplicated    Obstructive chronic bronchitis with acute bronchitis ([place])    Other acute osteomyelitis, unspecified ankle and foot (HCC)    Overactive bladder    Oxygen deficiency    PNA (pneumonia) 06/29/2011   Pneumonia    Pneumonia    Primary thyroid malignancy ([place]) 03/26/2021   Rectocele 10/09/2012   Substance abuse ([place])    MANY YEARS AGO    Symptomatic menopausal or female climacteric states    Thyroid enlargement 06/28/2011   Type 2 diabetes mellitus with diabetic peripheral angiopathy without gangrene (HCC)    Type 2 diabetes mellitus with hyperglycemia ([place])    Unspecified convulsions ([place])    Past Surgical History:  Procedure Laterality Date   COLONOSCOPY  Feb 2013   Dr. Michail Sermon: internal hemorrhoids, normal view of ileum, multiple hyperplastic polyps   ESOPHAGOGASTRODUODENOSCOPY  Feb 2013   Dr. Michail Sermon: normal duodenum, candida esophagitis, reactive gastropathy   TUBAL LIGATION     Social History   Socioeconomic History   Marital status: Single    Spouse name: Not on file   Number of children: Not on file   Years of education: Not on file   Highest education level: Not on file  Occupational History   Not on file  Tobacco Use   Smoking status: Every Day    Packs/day: 0.50    Years: 49.00    Pack years: 24.50    Types: Cigarettes    Start date: 05/10/1967   Smokeless tobacco: Never   Tobacco comments:    START 1969  Vaping Use   Vaping Use: Never used  Substance and Sexual Activity   Alcohol use: No    Alcohol/week: 0.0 standard drinks   Drug use: No   Sexual activity: Never    Birth control/protection: None  Other Topics Concern   Not on file  Social History Narrative   Not on file   Social Determinants of Health   Financial Resource Strain: Not on file  Food Insecurity: Not on file  Transportation Needs: Not on file  Physical Activity: Not on file  Stress: Not on file  Social Connections: Not on file   Family History  Problem Relation Age of Onset   Diabetes Mother    Hypertension Mother    Diabetes Father    Hypertension Father    Heart disease Father        HEART FAILURE   Skin cancer Brother        [place] type   Alcohol abuse Brother    Seizures Sister    Uterine cancer Sister    Early death Sister        Pneumonia at 58 months old   Early death Son    Drug abuse Son     Heart attack Son    Cancer Maternal Uncle    Cirrhosis Brother    Colon cancer Neg Hx    Outpatient Encounter Medications as of 03/26/2021  Medication Sig   aspirin EC 81 MG tablet Take 81 mg by mouth daily.   blood glucose meter kit and supplies KIT Dispense based on patient and insurance preference. Use up to four times daily as directed. (FOR ICD-9 250.00, 250.01).   BREZTRI AEROSPHERE 160-9-4.8 MCG/ACT AERO Inhale into the lungs.   cetirizine (ZYRTEC) 10 MG tablet Take by mouth.   cyclobenzaprine (FLEXERIL) 10 MG tablet Take 10 mg by mouth 2 (two) times daily.   DULoxetine (CYMBALTA) 60 MG capsule Take 60 mg by mouth daily.   furosemide (LASIX) 20 MG tablet Take 1 tablet (20 mg total) by mouth daily.   GLUCERNA (GLUCERNA) LIQD Take 237 mLs by mouth 3 (three) times daily between meals.   guaiFENesin (MUCINEX) 600 MG 12 hr tablet Take 1 tablet (600 mg total) by mouth 2 (two) times daily.   Incontinence Supply Disposable (POISE PANTILINERS) PADS 1 each by Does not apply route as needed.   levothyroxine (SYNTHROID) 50 MCG tablet Take 1 tablet (50 mcg total) by mouth daily.   loratadine (CLARITIN) 10 MG tablet Take 10 mg by mouth daily.   metFORMIN (GLUCOPHAGE) 500 MG tablet Take 500 mg by mouth 2 (two) times daily with a meal.    OXYGEN Inhale 2 L into the lungs daily as needed.   pantoprazole (PROTONIX) 40 MG tablet Take 40 mg by mouth daily.   potassium chloride (K-DUR) 10 MEQ tablet Take 1 tablet (10 mEq total) by mouth daily.   rosuvastatin (CRESTOR) 20 MG tablet Take 20 mg by mouth at bedtime.   albuterol (PROVENTIL) (2.5 MG/3ML) 0.083% nebulizer solution Take 3 mLs (2.5 mg total) by nebulization every 6 (six) hours as needed for wheezing or shortness of breath. (Patient not taking: Reported on 10/29/2020)   cetirizine HCl (ZYRTEC) 1 MG/ML solution Take 10 mLs (10 mg total) by mouth daily. (Patient not taking: Reported on 03/26/2021)   DULoxetine (CYMBALTA) 20 MG capsule TAKE 1 CAPSULE  BY MOUTH EVERY DAY. (Patient not taking: Reported on 03/26/2021)   ergocalciferol (VITAMIN D2) 1.25 MG (50000 UT) capsule Take 50,000 Units by mouth once a week. (Patient not taking: Reported  on 03/03/2021)   rosuvastatin (CRESTOR) 10 MG tablet Take 10 mg by mouth daily at 6 PM. (Patient not taking: Reported on 03/26/2021)   No facility-administered encounter medications on file as of 03/26/2021.   ALLERGIES: No Known Allergies  VACCINATION STATUS: Immunization History  Administered Date(s) Administered   Influenza Inj Mdck Quad Pf 04/29/2016   Influenza Split 06/29/2011   Influenza,inj,Quad PF,6+ Mos 01/30/2013, 05/25/2017   Influenza-Unspecified 02/20/2014, 02/17/2015, 04/29/2016, 04/01/2019   PPD Test 08/29/2012   Pneumococcal Conjugate-13 05/25/2017   Pneumococcal Polysaccharide-23 06/29/2011, 04/01/2019    HPI Kristin Wells is 70 y.o. female who presents today with a medical history as above. she was seen in June 2022 for hypothyroidism and found to have goiter.  After long absence from clinic, she reappeared last visit.  She was sent for thyroid fine-needle aspiration of nodule which returned malignant cytology. PMD: Celene Squibb, MD.  Patient is not optimal historian.  History is obtained by chart review and directly interviewing the patient.  She was diagnosed with hypothyroidism for approximately 5 years.  Her levothyroxine was adjusted at 50 mcg p.o. daily at breakfast during her last visit.  She admits to better consistency taking her thyroid medication this time.  Her TSH is above target, however her free T4 is also above target.    She is a chronic smoker.  She reports fatigue.  She has well-controlled diabetes, hypertension, hyperlipidemia. Her medical history is complicated by coronary artery disease, COPD, stage 2 renal insufficiency, Major depression. She reports significantly fluctuating body weight.  Shortness of breath on exertion.  she denies dysphagia, nor voice  change.   She has cervical lymphadenopathy.   Review of Systems  Constitutional: + Mildly fluctuating body weight, + fatigue, no subjective hyperthermia, no subjective hypothermia Eyes: no blurry vision, no xerophthalmia ENT: no sore throat, no nodules palpated in throat  Objective:    Vitals with BMI 03/26/2021 03/17/2021 03/03/2021  Height 5\' 0"  - -  Weight 135 lbs 13 oz - 137 lbs 10 oz  BMI 99.83 - -  Systolic 382 505 397  Diastolic 99 87 82  Pulse 673 89 88    BP (!) 161/99   Pulse (!) 101   Ht 5' (1.524 m)   Wt 135 lb 12.8 oz (61.6 kg)   BMI 26.52 kg/m   Wt Readings from Last 3 Encounters:  03/26/21 135 lb 12.8 oz (61.6 kg)  03/03/21 137 lb 9.6 oz (62.4 kg)  08/22/17 143 lb 0.6 oz (64.9 kg)    Physical Exam  Constitutional:  Body mass index is 26.52 kg/m.,  not in acute distress, normal state of mind Eyes: PERRLA, EOMI, no exophthalmos ENT: moist mucous membranes, + gross thyromegaly, + gross cervical lymphadenopathy   CMP ( most recent) CMP     Component Value Date/Time   NA 139 08/22/2017 1415   K 3.8 08/22/2017 1415   CL 99 08/22/2017 1415   CO2 34 (H) 08/22/2017 1415   GLUCOSE 91 08/22/2017 1415   BUN 10 08/19/2020 0000   CREATININE 1.1 08/19/2020 0000   CREATININE 0.97 08/22/2017 1415   CALCIUM 9.2 08/19/2020 0000   PROT 6.4 08/22/2017 1415   ALBUMIN 4.2 08/19/2020 0000   AST 9 (L) 08/22/2017 1415   ALT 11 08/22/2017 1415   ALKPHOS 66 01/31/2017 0457   BILITOT 0.4 08/22/2017 1415   GFRNONAA 61 08/22/2017 1415   GFRAA 71 08/22/2017 1415     Diabetic Labs (most recent): Lab Results  Component Value Date   HGBA1C 5.9 08/19/2020   HGBA1C 5.8 (H) 08/22/2017   HGBA1C 5.8 (H) 01/31/2017     Lipid Panel ( most recent) Lipid Panel     Component Value Date/Time   CHOL 143 08/19/2020 0000   TRIG 125 08/19/2020 0000   HDL 46 08/19/2020 0000   CHOLHDL 2.4 08/22/2017 1415   VLDL 22 08/19/2016 1435   LDLCALC 75 08/19/2020 0000   LDLCALC 57  08/22/2017 1415      Lab Results  Component Value Date   TSH 6.595 (H) 02/19/2021   TSH 4.99 08/19/2020   TSH 4.54 (H) 08/22/2017   TSH 2.83 08/19/2016   TSH 4.65 06/19/2013   TSH 3.199 10/04/2012   TSH 1.325 06/28/2011   TSH 0.982 01/09/2010   TSH 0.982 01/09/2010   FREET4 1.14 (H) 02/19/2021   FREET4 1.00 06/28/2011    Thyroid ultrasound on February 01, 2021 IMPRESSION: Solitary right superior thyroid nodule (labeled 1, 3.9 cm) which meets criteria (TI-RADS category 5) for tissue sampling. Recommend ultrasound-guided fine-needle aspiration.    Fine-needle aspiration of thyroid nodule on the right lobe on March 21, 2021 FINAL MICROSCOPIC DIAGNOSIS:  - Malignant cells present (Bethesda category VI)   SPECIMEN ADEQUACY:  Satisfactory for evaluation   Assessment & Plan:   1. Hypothyroidism 2.  Hashimoto's thyroiditis 3.  Solitary thyroid nodule in the right lobe-FNA showing malignancy  Her fine-needle aspiration biopsy of thyroid is malignant.  Not clear if it is differentiated or undifferentiated thyroid malignancy.  Afirma was not sent. She will be sent for lab to measure calcitonin and CEA. However, she will be sent to be evaluated by Dr. Arnoldo Morale for possible surgery, while waiting for her results.  Her thyroid function tests are consistent with Hashimoto's thyroiditis causing clinical hypothyroidism.  She is not consistent taking her thyroid hormone. She is advised to maintain her current dose of levothyroxine 50 mcg p.o. daily before breakfast. - We discussed about the correct intake of her thyroid hormone, on empty stomach at fasting, with water, separated by at least 30 minutes from breakfast and other medications,  and separated by more than 4 hours from calcium, iron, multivitamins, acid reflux medications (PPIs). -Patient is made aware of the fact that thyroid hormone replacement is needed for life, dose to be adjusted by periodic monitoring of thyroid function  tests.  She will likely need a higher dose after her thyroidectomy.   The patient was counseled on the dangers of tobacco use, and was advised to quit.  Reviewed strategies to maximize success, including removing cigarettes and smoking materials from environment.   She has well-controlled type 2 diabetes with A1c of 5.9%, will not need any medications for diabetes at this time.  - I did not initiate any new prescriptions today. - she is advised to maintain close follow up with Celene Squibb, MD for primary care needs.   I spent 31 minutes in the care of the patient today including review of labs from Thyroid Function, CMP, and other relevant labs ; imaging/biopsy records (current and previous including abstractions from other facilities); face-to-face time discussing  her lab results and symptoms, medications doses, her options of short and long term treatment based on the latest standards of care / guidelines;   and documenting the encounter.  Kristin Wells  participated in the discussions, expressed understanding, and voiced agreement with the above plans.  All questions were answered to her satisfaction. she is encouraged to contact clinic  should she have any questions or concerns prior to her return visit.   Follow up plan: Return in about 5 weeks (around 04/30/2021) for F/U with Labs after Surgery.   Glade Lloyd, MD [facility] [address] [place], [place] [address] Phone: [phone]  Fax: [phone]     03/26/2021, 8:24 PM  This note was partially dictated with voice recognition software. Similar sounding words can be transcribed inadequately or may not  be corrected upon review.

## 2021-04-05 DIAGNOSIS — G4089 Other seizures: Secondary | ICD-10-CM | POA: Diagnosis not present

## 2021-04-05 DIAGNOSIS — M47897 Other spondylosis, lumbosacral region: Secondary | ICD-10-CM | POA: Diagnosis not present

## 2021-04-05 DIAGNOSIS — J449 Chronic obstructive pulmonary disease, unspecified: Secondary | ICD-10-CM | POA: Diagnosis not present

## 2021-04-05 DIAGNOSIS — J9690 Respiratory failure, unspecified, unspecified whether with hypoxia or hypercapnia: Secondary | ICD-10-CM | POA: Diagnosis not present

## 2021-04-20 ENCOUNTER — Telehealth: Payer: Self-pay | Admitting: *Deleted

## 2021-04-20 ENCOUNTER — Ambulatory Visit: Payer: Self-pay | Admitting: Surgery

## 2021-04-20 DIAGNOSIS — C73 Malignant neoplasm of thyroid gland: Secondary | ICD-10-CM | POA: Diagnosis not present

## 2021-04-20 DIAGNOSIS — E041 Nontoxic single thyroid nodule: Secondary | ICD-10-CM | POA: Diagnosis not present

## 2021-04-20 NOTE — Telephone Encounter (Signed)
° °  Name: ARRIA NAIM  DOB: 11/05/1950  MRN: [id number]  Primary Cardiologist: None  Chart reviewed as part of pre-operative protocol coverage. Because of Kenyona Rena Hurta's past medical history and time since last visit, she will require a follow-up visit in order to better assess preoperative cardiovascular risk.  Pre-op covering staff: - Please schedule appointment and call patient to inform them. If patient already had an upcoming appointment within acceptable timeframe, please add "pre-op clearance" to the appointment notes so provider is aware. - Please contact requesting surgeon's office via preferred method (i.e, phone, fax) to inform them of need for appointment prior to surgery.  If applicable, this message will also be routed to pharmacy pool and/or primary cardiologist for input on holding anticoagulant/antiplatelet agent as requested below so that this information is available to the clearing provider at time of patient's appointment.   Patient used to be followed by Dr. Harrington Challenger however has not been seen by cardiology service since 2014.  She is considered a new patient and will need to establish with a new cardiologist.  Almyra Deforest, [place]  04/20/2021, 8:44 PM

## 2021-04-20 NOTE — H&P (Signed)
REFERRING PHYSICIAN:  Loni Beckwith, MD   PROVIDER:  Gomez Cleverly, MD   MRN: [id number] DOB: Mar 14, 1951 DATE OF ENCOUNTER: 04/20/2021   Subjective    Chief Complaint: New Consultation (Thyroid carcinoma - Dr. Dorris Wells)       History of Present Illness:   Patient is referred by Dr. Loni Wells for surgical evaluation and management of newly diagnosed thyroid cancer.  Patient's primary care physician is Dr. Delphina Wells.  Patient was found to have a right sided thyroid nodule.  She underwent a thyroid ultrasound on October 29, 2020.  This showed a normal-sized thyroid gland with a dominant nodule in the right superior pole measuring 3.9 x 3.3 x 2.8 cm.  Fine-needle aspiration biopsy was recommended.  This was performed on March 17, 2021.  Cytopathology demonstrated malignant cells, Bethesda category VI.  Patient was seen in consultation by Dr. Loni Wells.  She was diagnosed with hypothyroidism with a recent TSH level of 6.595.  She was started on levothyroxine.  Patient has had no prior head or neck surgery.  She has never been on thyroid medication.  There is a family history of apparent thyroid disease in the patient's sister who underwent surgery presumably for thyroid cancer.  Patient does have a history of hypertension and congestive heart failure.  She is not currently followed by cardiology.  She presents today to discuss thyroid surgery.     Review of Systems: A complete review of systems was obtained from the patient.  I have reviewed this information and discussed as appropriate with the patient.  See HPI as well for other ROS.   Review of Systems  Constitutional: Negative.   HENT: Negative.   Eyes: Negative.   Respiratory: Positive for shortness of breath.   Cardiovascular: Negative.   Gastrointestinal: Negative.   Genitourinary: Negative.   Musculoskeletal: Positive for neck pain.  Skin: Negative.   Neurological: Negative.   Endo/Heme/Allergies:  Negative.   Psychiatric/Behavioral: Negative.         Medical History: Past Medical History      Past Medical History:  Diagnosis Date   Anxiety     Arthritis     Asthma, unspecified asthma severity, unspecified whether complicated, unspecified whether persistent     CHF (congestive heart failure) (CMS-HCC)     Chronic kidney disease     Diabetes mellitus without complication (CMS-HCC)     GERD (gastroesophageal reflux disease)     History of cancer     Hyperlipidemia     Hypertension     Seizures (CMS-HCC)     Sleep apnea     Thyroid disease             Patient Active Problem List  Diagnosis   Papillary thyroid carcinoma (CMS-HCC)   Thyroid nodule      Past Surgical History       Past Surgical History:  Procedure Laterality Date   pelvic cancer       Tubilgation            Allergies  No Known Allergies           Current Outpatient Medications on File Prior to Visit  Medication Sig Dispense Refill   budesonide-glycopyrrolate-formoterol (BREZTRI AEROSPHERE) 160-9-4.8 mcg/actuation inhaler Inhale into the lungs       ALLERGY RELIEF, CETIRIZINE, 10 mg tablet         aspirin 81 MG EC tablet Take 1 tablet (81 mg total) by mouth once daily  cyclobenzaprine (FLEXERIL) 10 MG tablet Take 1 tablet (10 mg total) by mouth 2 (two) times daily       DULoxetine (CYMBALTA) 60 MG DR capsule         FUROsemide (LASIX) 20 MG tablet         levothyroxine (SYNTHROID) 50 MCG tablet         loratadine (CLARITIN) 10 mg tablet Take 1 tablet (10 mg total) by mouth once daily       metFORMIN (GLUCOPHAGE) 500 MG tablet         pantoprazole (PROTONIX) 40 MG DR tablet         potassium chloride (KLOR-CON) 10 MEQ ER tablet         rosuvastatin (CRESTOR) 20 MG tablet         VITAMIN D2 1,250 mcg (50,000 unit) capsule          No current facility-administered medications on file prior to visit.      Family History       Family History  Problem Relation Age of Onset   Obesity  Mother     High blood pressure (Hypertension) Mother     Hyperlipidemia (Elevated cholesterol) Mother     Coronary Artery Disease (Blocked arteries around heart) Mother     Diabetes Mother     Obesity Father     High blood pressure (Hypertension) Father     Hyperlipidemia (Elevated cholesterol) Father     Coronary Artery Disease (Blocked arteries around heart) Father     Obesity Sister     Hyperlipidemia (Elevated cholesterol) Sister     Coronary Artery Disease (Blocked arteries around heart) Sister     Obesity Brother          Social History        Tobacco Use  Smoking Status Every Day   Types: Cigarettes  Smokeless Tobacco Never      Social History  Social History         Socioeconomic History   Marital status: Single  Tobacco Use   Smoking status: Every Day      Types: Cigarettes   Smokeless tobacco: Never  Substance and Sexual Activity   Alcohol use: Never   Drug use: Never        Objective:         Vitals:    04/20/21 1343  BP: (!) 140/90  Pulse: 103  Temp: 36.5 C (97.7 F)  SpO2: 98%  Weight: 61.4 kg (135 lb 6.4 oz)  Height: 152.4 cm (5')    Body mass index is 26.44 kg/m.   Physical Exam    GENERAL APPEARANCE Development: normal Nutritional status: normal Gross deformities: none   SKIN Rash, lesions, ulcers: none Induration, erythema: none Nodules: none palpable   EYES Conjunctiva and lids: normal Pupils: equal and reactive Iris: normal bilaterally   EARS, NOSE, MOUTH, THROAT External ears: no lesion or deformity External nose: no lesion or deformity Hearing: grossly normal Due to Covid-19 pandemic, patient is wearing a mask.   NECK Symmetric: no Trachea: midline Thyroid: There is a dominant firm smooth palpable mass occupying most of the right thyroid lobe and extending beneath the right clavicle.  Left thyroid lobe is normal to palpation.  There is no palpable lymphadenopathy.  There is mild tenderness on the right side.    CHEST Respiratory effort: normal Retraction or accessory muscle use: no Breath sounds: normal bilaterally Rales, rhonchi, wheeze: none   CARDIOVASCULAR Auscultation: regular rhythm, normal  rate Murmurs: none Pulses: radial pulse 2+ palpable Lower extremity edema: none   MUSCULOSKELETAL Station and gait: normal Digits and nails: no clubbing or cyanosis Muscle strength: grossly normal all extremities Range of motion: grossly normal all extremities Deformity: none   LYMPHATIC Cervical: none palpable Supraclavicular: none palpable   PSYCHIATRIC Oriented to person, place, and time: yes Mood and affect: normal for situation Judgment and insight: appropriate for situation       Assessment and Plan:  Diagnoses and all orders for this visit:   Papillary thyroid carcinoma (CMS-HCC)   Thyroid nodule     Patient is referred by Dr. Loni Wells for surgical evaluation and management of newly diagnosed thyroid carcinoma.   Patient provided with a copy of "The Thyroid Book: Medical and Surgical Treatment of Thyroid Problems", published by [employer], 16 pages.  Book reviewed and explained to patient during visit today.   Today we discussed total thyroidectomy as the procedure of choice for management of a large malignant nodule in the right thyroid lobe.  We discussed the procedure, the size and location of the surgical incision, the risk and benefits including the risk of recurrent laryngeal nerve injury and injury to parathyroid glands.  We discussed the hospital stay to be anticipated.  We discussed her postoperative recovery  Patient will remain on thyroid hormone indefinitely.  Patient may or may not require radioactive iodine treatment postoperatively.   Due to the patient's history of hypertension and congestive heart failure, she will require assessment by cardiology prior to scheduling her for surgery.  We will make arrangements for this consultation in the near future.    The risks and benefits of the procedure have been discussed at length with the patient.  The patient understands the proposed procedure, potential alternative treatments, and the course of recovery to be expected.  All of the patient's questions have been answered at this time.  The patient wishes to proceed with surgery.  Armandina Gemma, MD [facility] [place] practice Office: [phone]

## 2021-04-20 NOTE — Telephone Encounter (Signed)
° °  Pre-operative Risk Assessment    Patient Name: Kristin Wells  DOB: 1951-04-11 MRN: [id number]      Request for Surgical Clearance    Procedure:   Thyroid surgery  Date of Surgery: TBD                                 Surgeon:   Armandina Gemma, MD Surgeon's Group or Practice Name:  [facility] Phone number:  [phone] Fax number:  [phone] attn Mammie Lorenzo, LPN   Type of Clearance Requested:   - Pharmacy:  Hold Aspirin     Type of Anesthesia:  General    Additional requests/questions:    Gillermina Phy   04/20/2021, 4:54 PM

## 2021-04-21 ENCOUNTER — Telehealth: Payer: Self-pay

## 2021-04-21 NOTE — Telephone Encounter (Signed)
NOTES SCANNED TO REFERRAL RJ 

## 2021-04-21 NOTE — Telephone Encounter (Signed)
Pt has been scheduled to see Dr. Harrington Challenger, 05/12/2021, and clearance will be addressed at that time.  Will route back to the requesting surgeon's office to make them aware.

## 2021-05-05 DIAGNOSIS — G4089 Other seizures: Secondary | ICD-10-CM | POA: Diagnosis not present

## 2021-05-05 DIAGNOSIS — M47897 Other spondylosis, lumbosacral region: Secondary | ICD-10-CM | POA: Diagnosis not present

## 2021-05-05 DIAGNOSIS — J449 Chronic obstructive pulmonary disease, unspecified: Secondary | ICD-10-CM | POA: Diagnosis not present

## 2021-05-05 DIAGNOSIS — J9690 Respiratory failure, unspecified, unspecified whether with hypoxia or hypercapnia: Secondary | ICD-10-CM | POA: Diagnosis not present

## 2021-05-14 ENCOUNTER — Ambulatory Visit (INDEPENDENT_AMBULATORY_CARE_PROVIDER_SITE_OTHER): Payer: 59 | Admitting: Internal Medicine

## 2021-05-14 ENCOUNTER — Other Ambulatory Visit: Payer: Self-pay

## 2021-05-14 ENCOUNTER — Encounter: Payer: Self-pay | Admitting: Internal Medicine

## 2021-05-14 ENCOUNTER — Ambulatory Visit: Payer: Medicare HMO | Admitting: Internal Medicine

## 2021-05-14 VITALS — BP 138/90 | HR 90 | Ht 60.0 in | Wt 139.2 lb

## 2021-05-14 DIAGNOSIS — I255 Ischemic cardiomyopathy: Secondary | ICD-10-CM | POA: Diagnosis not present

## 2021-05-14 DIAGNOSIS — Z01818 Encounter for other preprocedural examination: Secondary | ICD-10-CM

## 2021-05-14 DIAGNOSIS — Z01810 Encounter for preprocedural cardiovascular examination: Secondary | ICD-10-CM

## 2021-05-14 DIAGNOSIS — I2510 Atherosclerotic heart disease of native coronary artery without angina pectoris: Secondary | ICD-10-CM

## 2021-05-14 MED ORDER — ROSUVASTATIN CALCIUM 20 MG PO TABS: 20.0000 mg | ORAL_TABLET | Freq: Every day | ORAL | 3 refills | Status: DC

## 2021-05-14 NOTE — Progress Notes (Signed)
Cardiology Office Note   Date:  05/14/2021   ID:  Kristin Wells, DOB 1950-11-21, MRN [id number]  PCP:  Kristin Squibb, MD  Cardiologist:   Kristin Carnes, MD   Patient referred for preop evaluation    History of Present Illness: Kristin Wells is a 71 y.o. female with a history of HTN, HL, asthma, DM, CKD and CHF  Pt is followed by Kristin Wells.  Now being seen in surgery for thyroid nodule   Plan is for total thyroidectomy     The pt denies CP   Does get SOB with activity    her activity is extremely limited due to DJD   Uses walker   Gives out easily   Chronic problem    Current Meds  Medication Sig   albuterol (PROVENTIL) (2.5 MG/3ML) 0.083% nebulizer solution Take 3 mLs (2.5 mg total) by nebulization every 6 (six) hours as needed for wheezing or shortness of breath.   aspirin EC 81 MG tablet Take 81 mg by mouth daily.   blood glucose meter kit and supplies KIT Dispense based on patient and insurance preference. Use up to four times daily as directed. (FOR ICD-9 250.00, 250.01).   BREZTRI AEROSPHERE 160-9-4.8 MCG/ACT AERO Inhale into the lungs.   cetirizine (ZYRTEC) 10 MG tablet Take by mouth.   cyclobenzaprine (FLEXERIL) 10 MG tablet Take 10 mg by mouth 2 (two) times daily.   DULoxetine (CYMBALTA) 20 MG capsule TAKE 1 CAPSULE BY MOUTH EVERY DAY.   DULoxetine (CYMBALTA) 60 MG capsule Take 60 mg by mouth daily.   ergocalciferol (VITAMIN D2) 1.25 MG (50000 UT) capsule Take 50,000 Units by mouth once a week.   furosemide (LASIX) 20 MG tablet Take 1 tablet (20 mg total) by mouth daily.   GLUCERNA (GLUCERNA) LIQD Take 237 mLs by mouth 3 (three) times daily between meals.   guaiFENesin (MUCINEX) 600 MG 12 hr tablet Take 1 tablet (600 mg total) by mouth 2 (two) times daily.   Incontinence Supply Disposable (POISE PANTILINERS) PADS 1 each by Does not apply route as needed.   levothyroxine (SYNTHROID) 50 MCG tablet Take 1 tablet (50 mcg total) by mouth daily.   loratadine (CLARITIN) 10 MG tablet  Take 10 mg by mouth daily.   metFORMIN (GLUCOPHAGE) 500 MG tablet Take 500 mg by mouth 2 (two) times daily with a meal.    OXYGEN Inhale 2 L into the lungs daily as needed.   pantoprazole (PROTONIX) 40 MG tablet Take 40 mg by mouth daily.   potassium chloride (K-DUR) 10 MEQ tablet Take 1 tablet (10 mEq total) by mouth daily.   rosuvastatin (CRESTOR) 20 MG tablet Take 1 tablet (20 mg total) by mouth daily.   [DISCONTINUED] rosuvastatin (CRESTOR) 10 MG tablet Take 10 mg by mouth daily at 6 PM.     Allergies:   Patient has no known allergies.   Past Medical History:  Diagnosis Date   Acute respiratory failure ([place]) 06/2011   Vent dependent sec to COPD/PNA   Anxiety    Anxiety disorder, unspecified    Arthritis    Arthritis    Atherosclerotic heart disease of native coronary artery with unspecified angina pectoris (HCC)    Back pain    Bronchitis    CHF (congestive heart failure) (HCC)    Chronic obstructive pulmonary disease with (acute) exacerbation (HCC)    Chronic obstructive pulmonary disease, unspecified (HCC)    Chronic obstructive pulmonary disease, unspecified ([place])    COPD (  chronic obstructive pulmonary disease) (HCC)    Coronary artery disease    Demand ischemia of myocardium 06/28/2011   Depression    Diabetes mellitus without complication (HCC)    Dizziness and giddiness    Edema, unspecified    Hyperlipidemia    Hyperlipidemia, unspecified    Hypertension    Hypertensive heart disease without heart failure    Hypothyroidism, unspecified    Insomnia, unspecified    Ischemic cardiomyopathy 06/28/2011   EF 25%. Myoview stress test later showed ejection fraction of 65%   Lumbago    Major depressive disorder, single episode, unspecified    MI, acute, non ST segment elevation (HCC) 06/29/2011   Myoview stress test revealed mild perfusion defect   Myocardial infarction [facility])    Neuralgia and neuritis, unspecified    Nicotine dependence, unspecified, uncomplicated     Obstructive chronic bronchitis with acute bronchitis ([place])    Other acute osteomyelitis, unspecified ankle and foot (HCC)    Overactive bladder    Oxygen deficiency    PNA (pneumonia) 06/29/2011   Pneumonia    Pneumonia    Primary thyroid malignancy ([place]) 03/26/2021   Rectocele 10/09/2012   Substance abuse ([place])    MANY YEARS AGO   Symptomatic menopausal or female climacteric states    Thyroid enlargement 06/28/2011   Type 2 diabetes mellitus with diabetic peripheral angiopathy without gangrene ([place])    Type 2 diabetes mellitus with hyperglycemia ([place])    Unspecified convulsions ([place])     Past Surgical History:  Procedure Laterality Date   COLONOSCOPY  Feb 2013   Dr. Michail Sermon: internal hemorrhoids, normal view of ileum, multiple hyperplastic polyps   ESOPHAGOGASTRODUODENOSCOPY  Feb 2013   Dr. Michail Sermon: normal duodenum, candida esophagitis, reactive gastropathy   TUBAL LIGATION       Social History:  The patient  reports that she has been smoking cigarettes. She started smoking about 54 years ago. She has a 24.50 pack-year smoking history. She has never used smokeless tobacco. She reports that she does not drink alcohol and does not use drugs.   Family History:  The patient's family history includes Alcohol abuse in her brother; Cancer in her maternal uncle; Cirrhosis in her brother; Diabetes in her father and mother; Drug abuse in her son; Early death in her sister and son; Heart attack in her son; Heart disease in her father; Hypertension in her father and mother; Seizures in her sister; Skin cancer in her brother; Uterine cancer in her sister.    ROS:  Please see the history of present illness. All other systems are reviewed and  Negative to the above problem except as noted.    PHYSICAL EXAM: VS:  BP 138/90    Pulse 90    Ht 5' (1.524 m)    Wt 139 lb 3.2 oz (63.1 kg)    SpO2 93%    BMI 27.19 kg/m   GEN: Well nourished, well developed, in no acute distress  HEENT: normal  Neck:  no JVD, carotid bruits  Cardiac: RRR; II/VI sysotlicm murmur LSB    Tr LE edema  Respiratory:  clear to auscultation bilaterally,Decreased airflow  GI: soft, nontender  + BS  No hepatomegaly  MS: Moving all extremities   Skin: warm and dry, no rash Neuro:  Strength and sensation are intact Psych: euthymic mood, full affect   EKG:  EKG is ordered today. NSR 90 bpm   Nonspecific ST changes    Echo  04/12/14  - Left ventricle:  The cavity size was normal. Wall thickness was    increased in a pattern of mild LVH. Systolic function was normal.    The estimated ejection fraction was in the range of 55% to 60%.    Indeterminant diastolic function. There is evidence of elevated    LA pressure (E/e&' 12).  - Aortic valve: Mildly calcified annulus. Trileaflet; mildly    thickened leaflets. Valve area (VTI): 2.45 cm^2. Valve area    (Vmax): 2.29 cm^2.  - Mitral valve: Mildly calcified annulus. Normal thickness leaflets    .  - Systemic veins: IVC is dilated with normal respiratory response,    estimated RA pressure is 8 mmHg.  - Pericardium, extracardiac: Small circumferential pericardial    effusion.  - Technically difficult study.   Lipid Panel    Component Value Date/Time   CHOL 143 08/19/2020 0000   TRIG 125 08/19/2020 0000   HDL 46 08/19/2020 0000   CHOLHDL 2.4 08/22/2017 1415   VLDL 22 08/19/2016 1435   LDLCALC 75 08/19/2020 0000   LDLCALC 57 08/22/2017 1415      Wt Readings from Last 3 Encounters:  05/14/21 139 lb 3.2 oz (63.1 kg)  03/26/21 135 lb 12.8 oz (61.6 kg)  03/03/21 137 lb 9.6 oz (62.4 kg)      ASSESSMENT AND PLAN:  1  Preop risk assessment   Pt complains of chronic SOB.  Activity limited   Gives out  Overall less than 4 METS Would  start with echo to evaluate LV / RV function and PA pressures.   Would like to see echo windows.    Pt needs some functiona evaluation   Concern that airflow isdown   She has been on steroids in past    Consider dobutamine echo vs  lexiscan myoview       Pulmonary function may be limitng  Keep on same meds for now    2   HTN  BP is mildly increased   Was better in October   Follow for now   3  HL   Keep on Crestor 20     4  CVdz   Scaattered plaquing on Carotid USN in 2015  Will need to follow  5  COPD  Pt with decreased airflow on exam   Has inhalers    6  Thyroid   Papillary Ca   Plan for surgery        Current medicines are reviewed at length with the patient today.  The patient does not have concerns regarding medicines.  Signed, Kristin Carnes, MD  05/14/2021 10:51 AM    [address], [place], [place]  [address] Phone: [phone]; Fax: [phone]

## 2021-05-14 NOTE — Patient Instructions (Addendum)
Medication Instructions:   Crestor 20 mg daily  Your physician recommends that you continue on your current medications as directed. Please refer to the Current Medication list given to you today.  *If you need a refill on your cardiac medications before your next appointment, please call your pharmacy*   Lab Work: none If you have labs (blood work) drawn today and your tests are completely normal, you will receive your results only by: [place] (if you have MyChart) OR A paper copy in the mail If you have any lab test that is abnormal or we need to change your treatment, we will call you to review the results.   Testing/Procedures: Your physician has requested that you have an echocardiogram. Echocardiography is a painless test that uses sound waves to create images of your heart. It provides your doctor with information about the size and shape of your heart and how well your hearts chambers and valves are working. This procedure takes approximately one hour. There are no restrictions for this procedure.    Follow-Up: Will call after her Echo [facility], you and your health needs are our priority.  As part of our continuing mission to provide you with exceptional heart care, we have created designated Provider Care Teams.  These Care Teams include your primary Cardiologist (physician) and Advanced Practice Providers (APPs -  Physician Assistants and Nurse Practitioners) who all work together to provide you with the care you need, when you need it.  We recommend signing up for the patient portal called "MyChart".  Sign up information is provided on this After Visit Summary.  MyChart is used to connect with patients for Virtual Visits (Telemedicine).  Patients are able to view lab/test results, encounter notes, upcoming appointments, etc.  Non-urgent messages can be sent to your provider as well.   To learn more about what you can do with MyChart, go to [email or web address].

## 2021-05-14 NOTE — Progress Notes (Signed)
Cardiology Office Note   Date:  05/14/2021   ID:  Kristin Wells, DOB 09-25-1950, MRN [id number]  PCP:  Kristin Squibb, MD  Cardiologist:   Kristin Carnes, MD       History of Present Illness: Kristin Wells is a 71 y.o. female with a history of thyroid cancer, HTN, CHF   Being evaluated for thyroid surgery   Prsents to cardiology for preop evaluation   Pt says her BP is good at home    Goes up when anxious Has been on glu med for 11 years   Sugars run good   Pt denies CP   Patient walks    Has problems with back   Uses walker   Limted by back pain    Can sweep a min then stops because of back pain   Can't walk up steps well   Patient had been homeless   Had nurse visiting  Pt had chest CT in 2013   had diffuse coronary calcifications      Current Meds  Medication Sig   albuterol (PROVENTIL) (2.5 MG/3ML) 0.083% nebulizer solution Take 3 mLs (2.5 mg total) by nebulization every 6 (six) hours as needed for wheezing or shortness of breath.   aspirin EC 81 MG tablet Take 81 mg by mouth daily.   blood glucose meter kit and supplies KIT Dispense based on patient and insurance preference. Use up to four times daily as directed. (FOR ICD-9 250.00, 250.01).   BREZTRI AEROSPHERE 160-9-4.8 MCG/ACT AERO Inhale into the lungs.   cetirizine (ZYRTEC) 10 MG tablet Take by mouth.   cyclobenzaprine (FLEXERIL) 10 MG tablet Take 10 mg by mouth 2 (two) times daily.   DULoxetine (CYMBALTA) 20 MG capsule TAKE 1 CAPSULE BY MOUTH EVERY DAY.   DULoxetine (CYMBALTA) 60 MG capsule Take 60 mg by mouth daily.   ergocalciferol (VITAMIN D2) 1.25 MG (50000 UT) capsule Take 50,000 Units by mouth once a week.   furosemide (LASIX) 20 MG tablet Take 1 tablet (20 mg total) by mouth daily.   GLUCERNA (GLUCERNA) LIQD Take 237 mLs by mouth 3 (three) times daily between meals.   guaiFENesin (MUCINEX) 600 MG 12 hr tablet Take 1 tablet (600 mg total) by mouth 2 (two) times daily.   Incontinence Supply Disposable (POISE  PANTILINERS) PADS 1 each by Does not apply route as needed.   levothyroxine (SYNTHROID) 50 MCG tablet Take 1 tablet (50 mcg total) by mouth daily.   loratadine (CLARITIN) 10 MG tablet Take 10 mg by mouth daily.   metFORMIN (GLUCOPHAGE) 500 MG tablet Take 500 mg by mouth 2 (two) times daily with a meal.    OXYGEN Inhale 2 L into the lungs daily as needed.   pantoprazole (PROTONIX) 40 MG tablet Take 40 mg by mouth daily.   potassium chloride (K-DUR) 10 MEQ tablet Take 1 tablet (10 mEq total) by mouth daily.   rosuvastatin (CRESTOR) 10 MG tablet Take 10 mg by mouth daily at 6 PM.     Allergies:   Patient has no known allergies.   Past Medical History:  Diagnosis Date   Acute respiratory failure ([place]) 06/2011   Vent dependent sec to COPD/PNA   Anxiety    Anxiety disorder, unspecified    Arthritis    Arthritis    Atherosclerotic heart disease of native coronary artery with unspecified angina pectoris (HCC)    Back pain    Bronchitis    CHF (congestive heart failure) ([place])  Chronic obstructive pulmonary disease with (acute) exacerbation (HCC)    Chronic obstructive pulmonary disease, unspecified (HCC)    Chronic obstructive pulmonary disease, unspecified (HCC)    COPD (chronic obstructive pulmonary disease) (HCC)    Coronary artery disease    Demand ischemia of myocardium 06/28/2011   Depression    Diabetes mellitus without complication (HCC)    Dizziness and giddiness    Edema, unspecified    Hyperlipidemia    Hyperlipidemia, unspecified    Hypertension    Hypertensive heart disease without heart failure    Hypothyroidism, unspecified    Insomnia, unspecified    Ischemic cardiomyopathy 06/28/2011   EF 25%. Myoview stress test later showed ejection fraction of 65%   Lumbago    Major depressive disorder, single episode, unspecified    MI, acute, non ST segment elevation (HCC) 06/29/2011   Myoview stress test revealed mild perfusion defect   Myocardial infarction [facility])     Neuralgia and neuritis, unspecified    Nicotine dependence, unspecified, uncomplicated    Obstructive chronic bronchitis with acute bronchitis ([place])    Other acute osteomyelitis, unspecified ankle and foot (HCC)    Overactive bladder    Oxygen deficiency    PNA (pneumonia) 06/29/2011   Pneumonia    Pneumonia    Primary thyroid malignancy ([place]) 03/26/2021   Rectocele 10/09/2012   Substance abuse ([place])    MANY YEARS AGO   Symptomatic menopausal or female climacteric states    Thyroid enlargement 06/28/2011   Type 2 diabetes mellitus with diabetic peripheral angiopathy without gangrene ([place])    Type 2 diabetes mellitus with hyperglycemia ([place])    Unspecified convulsions ([place])     Past Surgical History:  Procedure Laterality Date   COLONOSCOPY  Feb 2013   Dr. Michail Sermon: internal hemorrhoids, normal view of ileum, multiple hyperplastic polyps   ESOPHAGOGASTRODUODENOSCOPY  Feb 2013   Dr. Michail Sermon: normal duodenum, candida esophagitis, reactive gastropathy   TUBAL LIGATION       Social History:  The patient  reports that she has been smoking cigarettes. She started smoking about 54 years ago. She has a 24.50 pack-year smoking history. She has never used smokeless tobacco. She reports that she does not drink alcohol and does not use drugs.   Family History:  The patient's family history includes Alcohol abuse in her brother; Cancer in her maternal uncle; Cirrhosis in her brother; Diabetes in her father and mother; Drug abuse in her son; Early death in her sister and son; Heart attack in her son; Heart disease in her father; Hypertension in her father and mother; Seizures in her sister; Skin cancer in her brother; Uterine cancer in her sister.    ROS:  Please see the history of present illness. All other systems are reviewed and  Negative to the above problem except as noted.    PHYSICAL EXAM: VS:  BP 138/90    Pulse 90    Ht 5' (1.524 m)    Wt 139 lb 3.2 oz (63.1 kg)    SpO2 93%    BMI  27.19 kg/m   GEN: Well nourished, well developed, in no acute distress  HEENT: normal  Neck: no JVD, carotid bruits, or masses Cardiac: RRR; no murmurs, rubs, or gallops,no edema  Respiratory:  clear to auscultation bilaterally, normal work of breathing GI: soft, nontender, nondistended, + BS  No hepatomegaly  MS: no deformity Moving all extremities   Skin: warm and dry, no rash Neuro:  Strength and sensation are intact  Psych: euthymic mood, full affect   EKG:  EKG is ordered today.   Echo 04/2014  - Left ventricle: The cavity size was normal. Wall thickness was    increased in a pattern of mild LVH. Systolic function was normal.    The estimated ejection fraction was in the range of 55% to 60%.    Indeterminant diastolic function. There is evidence of elevated    LA pressure (E/e&' 12).  - Aortic valve: Mildly calcified annulus. Trileaflet; mildly    thickened leaflets. Valve area (VTI): 2.45 cm^2. Valve area    (Vmax): 2.29 cm^2.  - Mitral valve: Mildly calcified annulus. Normal thickness leaflets    .  - Systemic veins: IVC is dilated with normal respiratory response,    estimated RA pressure is 8 mmHg.  - Pericardium, extracardiac: Small circumferential pericardial    effusion.  - Technically difficult study.   Lipid Panel    Component Value Date/Time   CHOL 143 08/19/2020 0000   TRIG 125 08/19/2020 0000   HDL 46 08/19/2020 0000   CHOLHDL 2.4 08/22/2017 1415   VLDL 22 08/19/2016 1435   LDLCALC 75 08/19/2020 0000   LDLCALC 57 08/22/2017 1415      Wt Readings from Last 3 Encounters:  05/14/21 139 lb 3.2 oz (63.1 kg)  03/26/21 135 lb 12.8 oz (61.6 kg)  03/03/21 137 lb 9.6 oz (62.4 kg)      ASSESSMENT AND PLAN:     Current medicines are reviewed at length with the patient today.  The patient does not have concerns regarding medicines.  Signed, Kristin Carnes, MD  05/14/2021 10:06 AM    [address], [place], [place]   [address] Phone: [phone]; Fax: [phone]

## 2021-05-17 ENCOUNTER — Other Ambulatory Visit: Payer: Self-pay | Admitting: "Endocrinology

## 2021-05-25 ENCOUNTER — Ambulatory Visit: Payer: Medicare HMO | Admitting: "Endocrinology

## 2021-05-26 ENCOUNTER — Ambulatory Visit (HOSPITAL_COMMUNITY): Payer: 59 | Attending: Cardiology

## 2021-05-26 ENCOUNTER — Other Ambulatory Visit: Payer: Self-pay

## 2021-05-26 DIAGNOSIS — Z01818 Encounter for other preprocedural examination: Secondary | ICD-10-CM | POA: Insufficient documentation

## 2021-05-26 DIAGNOSIS — I255 Ischemic cardiomyopathy: Secondary | ICD-10-CM | POA: Diagnosis present

## 2021-05-26 DIAGNOSIS — I2510 Atherosclerotic heart disease of native coronary artery without angina pectoris: Secondary | ICD-10-CM | POA: Insufficient documentation

## 2021-05-26 LAB — ECHOCARDIOGRAM COMPLETE
Area-P 1/2: 2.69 cm2
S' Lateral: 2.3 cm

## 2021-06-03 ENCOUNTER — Telehealth: Payer: Self-pay | Admitting: *Deleted

## 2021-06-03 ENCOUNTER — Encounter: Payer: Self-pay | Admitting: *Deleted

## 2021-06-03 DIAGNOSIS — I2510 Atherosclerotic heart disease of native coronary artery without angina pectoris: Secondary | ICD-10-CM

## 2021-06-03 NOTE — Telephone Encounter (Signed)
-----   Message from Dorris Carnes V, MD sent at 06/03/2021 11:15 AM EST ----- Pumping function of the heart is normal    I saw her as preop for thyroid nodule and plans for surgery  She does very little   has severe COPD I would recomm a dobutamine echo to r/o large area of ischemia/wall motion abnormality

## 2021-06-03 NOTE — Telephone Encounter (Signed)
Pt notified of test results and the need for dobutamine echo.

## 2021-06-07 ENCOUNTER — Other Ambulatory Visit (HOSPITAL_COMMUNITY): Payer: Self-pay | Admitting: Family Medicine

## 2021-06-07 DIAGNOSIS — Z1231 Encounter for screening mammogram for malignant neoplasm of breast: Secondary | ICD-10-CM

## 2021-06-07 DIAGNOSIS — Z13820 Encounter for screening for osteoporosis: Secondary | ICD-10-CM

## 2021-07-01 ENCOUNTER — Other Ambulatory Visit (HOSPITAL_COMMUNITY): Payer: 59

## 2021-07-01 ENCOUNTER — Inpatient Hospital Stay (HOSPITAL_COMMUNITY): Admission: RE | Admit: 2021-07-01 | Payer: 59 | Source: Ambulatory Visit

## 2021-07-06 ENCOUNTER — Other Ambulatory Visit (HOSPITAL_COMMUNITY): Payer: 59

## 2021-07-12 ENCOUNTER — Other Ambulatory Visit: Payer: Self-pay

## 2021-07-12 ENCOUNTER — Ambulatory Visit (HOSPITAL_COMMUNITY)
Admission: RE | Admit: 2021-07-12 | Discharge: 2021-07-12 | Disposition: A | Discharge status: Home / Self Care | Payer: 59 | Source: Ambulatory Visit | Attending: Internal Medicine | Admitting: Internal Medicine

## 2021-07-12 DIAGNOSIS — I2510 Atherosclerotic heart disease of native coronary artery without angina pectoris: Secondary | ICD-10-CM

## 2021-07-12 DIAGNOSIS — Z7689 Persons encountering health services in other specified circumstances: Secondary | ICD-10-CM | POA: Diagnosis not present

## 2021-07-12 MED ORDER — DOBUTAMINE INFUSION FOR EP/ECHO/NUC (1000 MCG/ML)
INTRAVENOUS | Status: AC
  Administered 2021-07-12: 250 mg via INTRAVENOUS
  Filled 2021-07-12: qty 250

## 2021-07-12 NOTE — Progress Notes (Signed)
*  PRELIMINARY RESULTS* ?Echocardiogram ?Echocardiogram Pharmacologic Stress Test has been performed. ? ?Kristin Wells ?07/12/2021, 12:11 PM ?

## 2021-07-21 ENCOUNTER — Telehealth: Payer: Self-pay | Admitting: Internal Medicine

## 2021-07-21 NOTE — Telephone Encounter (Signed)
Fay Records, MD  ?07/14/2021  6:59 AM EST   ?  ?Dobutamine stress echo is normal   Normal response of heart to increased work ?From cardiac standpoint patient is at low risk for surgery    OK to proceed    ?Please forward to surgeon as well  ? ?

## 2021-07-21 NOTE — Telephone Encounter (Signed)
Pt notified and verbalized understanding. Pt had no questions or concerns at this time.  

## 2021-07-21 NOTE — Telephone Encounter (Signed)
Patient returning call for stress echo results. ?

## 2021-07-26 ENCOUNTER — Telehealth: Payer: Self-pay | Admitting: Internal Medicine

## 2021-07-26 NOTE — Telephone Encounter (Signed)
Calling back to talk to the Dr. Harrington Challenger bout her thyroid procedure. Please advise ?

## 2021-07-26 NOTE — Telephone Encounter (Addendum)
I spoke with the pt and she will call the surgeons office Dr. Harlow Asa to see what the next step is to having her thyroid surgery.  ? ?I faxed Dr. Harrington Challenger' last note and stress test to his office.  ?

## 2021-08-03 DIAGNOSIS — M47897 Other spondylosis, lumbosacral region: Secondary | ICD-10-CM | POA: Diagnosis not present

## 2021-08-03 DIAGNOSIS — J449 Chronic obstructive pulmonary disease, unspecified: Secondary | ICD-10-CM | POA: Diagnosis not present

## 2021-08-03 DIAGNOSIS — J9690 Respiratory failure, unspecified, unspecified whether with hypoxia or hypercapnia: Secondary | ICD-10-CM | POA: Diagnosis not present

## 2021-09-03 DIAGNOSIS — M47897 Other spondylosis, lumbosacral region: Secondary | ICD-10-CM | POA: Diagnosis not present

## 2021-09-03 DIAGNOSIS — J9690 Respiratory failure, unspecified, unspecified whether with hypoxia or hypercapnia: Secondary | ICD-10-CM | POA: Diagnosis not present

## 2021-09-03 DIAGNOSIS — J449 Chronic obstructive pulmonary disease, unspecified: Secondary | ICD-10-CM | POA: Diagnosis not present

## 2021-09-09 NOTE — Progress Notes (Signed)
Sent message, via epic in basket, requesting orders in epic from surgeon.  

## 2021-09-10 DIAGNOSIS — E039 Hypothyroidism, unspecified: Secondary | ICD-10-CM | POA: Diagnosis not present

## 2021-09-10 DIAGNOSIS — E1165 Type 2 diabetes mellitus with hyperglycemia: Secondary | ICD-10-CM | POA: Diagnosis not present

## 2021-09-10 DIAGNOSIS — Z7689 Persons encountering health services in other specified circumstances: Secondary | ICD-10-CM | POA: Diagnosis not present

## 2021-09-10 DIAGNOSIS — R809 Proteinuria, unspecified: Secondary | ICD-10-CM | POA: Diagnosis not present

## 2021-09-12 ENCOUNTER — Ambulatory Visit: Payer: Self-pay | Admitting: Surgery

## 2021-09-12 NOTE — H&P (Signed)
? ? ? ?REFERRING PHYSICIAN: Loni Beckwith, MD ? ?PROVIDER: Ryson Bacha Charlotta Newton, MD ? ? ? ?Chief Complaint: New Consultation (Thyroid carcinoma - Dr. Dorris Fetch) ? ?History of Present Illness: ? ?Patient is referred by Dr. Loni Beckwith for surgical evaluation and management of newly diagnosed thyroid cancer. Patient's primary care physician is Dr. Delphina Cahill. Patient was found to have a right sided thyroid nodule. She underwent a thyroid ultrasound on October 29, 2020. This showed a normal-sized thyroid gland with a dominant nodule in the right superior pole measuring 3.9 x 3.3 x 2.8 cm. Fine-needle aspiration biopsy was recommended. This was performed on March 17, 2021. Cytopathology demonstrated malignant cells, Bethesda category VI. Patient was seen in consultation by Dr. Loni Beckwith. She was diagnosed with hypothyroidism with a recent TSH level of 6.595. She was started on levothyroxine. Patient has had no prior head or neck surgery. She has never been on thyroid medication. There is a family history of apparent thyroid disease in the patient's sister who underwent surgery presumably for thyroid cancer. Patient does have a history of hypertension and congestive heart failure. She is not currently followed by cardiology. She presents today to discuss thyroid surgery. ? ?Review of Systems: ?A complete review of systems was obtained from the patient. I have reviewed this information and discussed as appropriate with the patient. See HPI as well for other ROS. ? ?Review of Systems  ?Constitutional: Negative.  ?HENT: Negative.  ?Eyes: Negative.  ?Respiratory: Positive for shortness of breath.  ?Cardiovascular: Negative.  ?Gastrointestinal: Negative.  ?Genitourinary: Negative.  ?Musculoskeletal: Positive for neck pain.  ?Skin: Negative.  ?Neurological: Negative.  ?Endo/Heme/Allergies: Negative.  ?Psychiatric/Behavioral: Negative.  ? ? ?Medical History: ?Past Medical History:  ?Diagnosis Date  ? Anxiety  ?  Arthritis  ? Asthma, unspecified asthma severity, unspecified whether complicated, unspecified whether persistent  ? CHF (congestive heart failure) (CMS-HCC)  ? Chronic kidney disease  ? Diabetes mellitus without complication (CMS-HCC)  ? GERD (gastroesophageal reflux disease)  ? History of cancer  ? Hyperlipidemia  ? Hypertension  ? Seizures (CMS-HCC)  ? Sleep apnea  ? Thyroid disease  ? ?Patient Active Problem List  ?Diagnosis  ? Papillary thyroid carcinoma (CMS-HCC)  ? Thyroid nodule  ? ?Past Surgical History:  ?Procedure Laterality Date  ? pelvic cancer  ? Tubilgation  ? ? ?No Known Allergies ? ?Current Outpatient Medications on File Prior to Visit  ?Medication Sig Dispense Refill  ? budesonide-glycopyrrolate-formoterol (BREZTRI AEROSPHERE) 160-9-4.8 mcg/actuation inhaler Inhale into the lungs  ? ALLERGY RELIEF, CETIRIZINE, 10 mg tablet  ? aspirin 81 MG EC tablet Take 1 tablet (81 mg total) by mouth once daily  ? cyclobenzaprine (FLEXERIL) 10 MG tablet Take 1 tablet (10 mg total) by mouth 2 (two) times daily  ? DULoxetine (CYMBALTA) 60 MG DR capsule  ? FUROsemide (LASIX) 20 MG tablet  ? levothyroxine (SYNTHROID) 50 MCG tablet  ? loratadine (CLARITIN) 10 mg tablet Take 1 tablet (10 mg total) by mouth once daily  ? metFORMIN (GLUCOPHAGE) 500 MG tablet  ? pantoprazole (PROTONIX) 40 MG DR tablet  ? potassium chloride (KLOR-CON) 10 MEQ ER tablet  ? rosuvastatin (CRESTOR) 20 MG tablet  ? VITAMIN D2 1,250 mcg (50,000 unit) capsule  ? ?No current facility-administered medications on file prior to visit.  ? ?Family History  ?Problem Relation Age of Onset  ? Obesity Mother  ? High blood pressure (Hypertension) Mother  ? Hyperlipidemia (Elevated cholesterol) Mother  ? Coronary Artery Disease (Blocked arteries around heart) Mother  ?  Diabetes Mother  ? Obesity Father  ? High blood pressure (Hypertension) Father  ? Hyperlipidemia (Elevated cholesterol) Father  ? Coronary Artery Disease (Blocked arteries around heart) Father   ? Obesity Sister  ? Hyperlipidemia (Elevated cholesterol) Sister  ? Coronary Artery Disease (Blocked arteries around heart) Sister  ? Obesity Brother  ? ? ?Social History  ? ?Tobacco Use  ?Smoking Status Every Day  ? Types: Cigarettes  ?Smokeless Tobacco Never  ? ? ?Social History  ? ?Socioeconomic History  ? Marital status: Single  ?Tobacco Use  ? Smoking status: Every Day  ?Types: Cigarettes  ? Smokeless tobacco: Never  ?Substance and Sexual Activity  ? Alcohol use: Never  ? Drug use: Never  ? ?Objective:  ? ?Vitals:  ?BP: (!) 140/90  ?Pulse: 103  ?Temp: 36.5 ?C (97.7 ?F)  ?SpO2: 98%  ?Weight: 61.4 kg (135 lb 6.4 oz)  ?Height: 152.4 cm (5')  ? ?Body mass index is 26.44 kg/m?. ? ?Physical Exam  ? ?GENERAL APPEARANCE ?Development: normal ?Nutritional status: normal ?Gross deformities: none ? ?SKIN ?Rash, lesions, ulcers: none ?Induration, erythema: none ?Nodules: none palpable ? ?EYES ?Conjunctiva and lids: normal ?Pupils: equal and reactive ?Iris: normal bilaterally ? ?EARS, NOSE, MOUTH, THROAT ?External ears: no lesion or deformity ?External nose: no lesion or deformity ?Hearing: grossly normal ?Due to Covid-19 pandemic, patient is wearing a mask. ? ?NECK ?Symmetric: no ?Trachea: midline ?Thyroid: There is a dominant firm smooth palpable mass occupying most of the right thyroid lobe and extending beneath the right clavicle. Left thyroid lobe is normal to palpation. There is no palpable lymphadenopathy. There is mild tenderness on the right side. ? ?CHEST ?Respiratory effort: normal ?Retraction or accessory muscle use: no ?Breath sounds: normal bilaterally ?Rales, rhonchi, wheeze: none ? ?CARDIOVASCULAR ?Auscultation: regular rhythm, normal rate ?Murmurs: none ?Pulses: radial pulse 2+ palpable ?Lower extremity edema: none ? ?MUSCULOSKELETAL ?Station and gait: normal ?Digits and nails: no clubbing or cyanosis ?Muscle strength: grossly normal all extremities ?Range of motion: grossly normal all  extremities ?Deformity: none ? ?LYMPHATIC ?Cervical: none palpable ?Supraclavicular: none palpable ? ?PSYCHIATRIC ?Oriented to person, place, and time: yes ?Mood and affect: normal for situation ?Judgment and insight: appropriate for situation ? ? ? ?Assessment and Plan:  ? ?Papillary thyroid carcinoma (CMS-HCC) ? ?Thyroid nodule ? ?Patient is referred by Dr. Loni Beckwith for surgical evaluation and management of newly diagnosed thyroid carcinoma. ? ?Patient provided with a copy of "The Thyroid Book: Medical and Surgical Treatment of Thyroid Problems", published by [employer], 16 pages. Book reviewed and explained to patient during visit today. ? ?Today we discussed total thyroidectomy as the procedure of choice for management of a large malignant nodule in the right thyroid lobe. We discussed the procedure, the size and location of the surgical incision, the risk and benefits including the risk of recurrent laryngeal nerve injury and injury to parathyroid glands. We discussed the hospital stay to be anticipated. We discussed her postoperative recovery Patient will remain on thyroid hormone indefinitely. Patient may or may not require radioactive iodine treatment postoperatively. ? ?Due to the patient's history of hypertension and congestive heart failure, she will require assessment by cardiology prior to scheduling her for surgery. We will make arrangements for this consultation in the near future. ? ?The risks and benefits of the procedure have been discussed at length with the patient. The patient understands the proposed procedure, potential alternative treatments, and the course of recovery to be expected. All of the patient's questions have been answered at  this time. The patient wishes to proceed with surgery. ? ?Armandina Gemma, MD ?[facility] ?A [facility] practice ?Office: [phone] ? ? ?

## 2021-09-12 NOTE — H&P (View-Only) (Signed)
REFERRING PHYSICIAN: Loni Beckwith, MD  PROVIDER: Jahzara Slattery Charlotta Newton, MD    Chief Complaint: New Consultation (Thyroid carcinoma - Dr. Dorris Fetch)  History of Present Illness:  Patient is referred by Dr. Loni Beckwith for surgical evaluation and management of newly diagnosed thyroid cancer. Patient's primary care physician is Dr. Delphina Cahill. Patient was found to have a right sided thyroid nodule. She underwent a thyroid ultrasound on October 29, 2020. This showed a normal-sized thyroid gland with a dominant nodule in the right superior pole measuring 3.9 x 3.3 x 2.8 cm. Fine-needle aspiration biopsy was recommended. This was performed on March 17, 2021. Cytopathology demonstrated malignant cells, Bethesda category VI. Patient was seen in consultation by Dr. Loni Beckwith. She was diagnosed with hypothyroidism with a recent TSH level of 6.595. She was started on levothyroxine. Patient has had no prior head or neck surgery. She has never been on thyroid medication. There is a family history of apparent thyroid disease in the patient's sister who underwent surgery presumably for thyroid cancer. Patient does have a history of hypertension and congestive heart failure. She is not currently followed by cardiology. She presents today to discuss thyroid surgery.  Review of Systems: A complete review of systems was obtained from the patient. I have reviewed this information and discussed as appropriate with the patient. See HPI as well for other ROS.  Review of Systems  Constitutional: Negative.  HENT: Negative.  Eyes: Negative.  Respiratory: Positive for shortness of breath.  Cardiovascular: Negative.  Gastrointestinal: Negative.  Genitourinary: Negative.  Musculoskeletal: Positive for neck pain.  Skin: Negative.  Neurological: Negative.  Endo/Heme/Allergies: Negative.  Psychiatric/Behavioral: Negative.    Medical History: Past Medical History:  Diagnosis Date   Anxiety    Arthritis   Asthma, unspecified asthma severity, unspecified whether complicated, unspecified whether persistent   CHF (congestive heart failure) (CMS-HCC)   Chronic kidney disease   Diabetes mellitus without complication (CMS-HCC)   GERD (gastroesophageal reflux disease)   History of cancer   Hyperlipidemia   Hypertension   Seizures (CMS-HCC)   Sleep apnea   Thyroid disease   Patient Active Problem List  Diagnosis   Papillary thyroid carcinoma (CMS-HCC)   Thyroid nodule   Past Surgical History:  Procedure Laterality Date   pelvic cancer   Tubilgation    No Known Allergies  Current Outpatient Medications on File Prior to Visit  Medication Sig Dispense Refill   budesonide-glycopyrrolate-formoterol (BREZTRI AEROSPHERE) 160-9-4.8 mcg/actuation inhaler Inhale into the lungs   ALLERGY RELIEF, CETIRIZINE, 10 mg tablet   aspirin 81 MG EC tablet Take 1 tablet (81 mg total) by mouth once daily   cyclobenzaprine (FLEXERIL) 10 MG tablet Take 1 tablet (10 mg total) by mouth 2 (two) times daily   DULoxetine (CYMBALTA) 60 MG DR capsule   FUROsemide (LASIX) 20 MG tablet   levothyroxine (SYNTHROID) 50 MCG tablet   loratadine (CLARITIN) 10 mg tablet Take 1 tablet (10 mg total) by mouth once daily   metFORMIN (GLUCOPHAGE) 500 MG tablet   pantoprazole (PROTONIX) 40 MG DR tablet   potassium chloride (KLOR-CON) 10 MEQ ER tablet   rosuvastatin (CRESTOR) 20 MG tablet   VITAMIN D2 1,250 mcg (50,000 unit) capsule   No current facility-administered medications on file prior to visit.   Family History  Problem Relation Age of Onset   Obesity Mother   High blood pressure (Hypertension) Mother   Hyperlipidemia (Elevated cholesterol) Mother   Coronary Artery Disease (Blocked arteries around heart) Mother  Diabetes Mother   Obesity Father   High blood pressure (Hypertension) Father   Hyperlipidemia (Elevated cholesterol) Father   Coronary Artery Disease (Blocked arteries around heart) Father    Obesity Sister   Hyperlipidemia (Elevated cholesterol) Sister   Coronary Artery Disease (Blocked arteries around heart) Sister   Obesity Brother    Social History   Tobacco Use  Smoking Status Every Day   Types: Cigarettes  Smokeless Tobacco Never    Social History   Socioeconomic History   Marital status: Single  Tobacco Use   Smoking status: Every Day  Types: Cigarettes   Smokeless tobacco: Never  Substance and Sexual Activity   Alcohol use: Never   Drug use: Never   Objective:   Vitals:  BP: (!) 140/90  Pulse: 103  Temp: 36.5 C (97.7 F)  SpO2: 98%  Weight: 61.4 kg (135 lb 6.4 oz)  Height: 152.4 cm (5')   Body mass index is 26.44 kg/m.  Physical Exam   GENERAL APPEARANCE Development: normal Nutritional status: normal Gross deformities: none  SKIN Rash, lesions, ulcers: none Induration, erythema: none Nodules: none palpable  EYES Conjunctiva and lids: normal Pupils: equal and reactive Iris: normal bilaterally  EARS, NOSE, MOUTH, THROAT External ears: no lesion or deformity External nose: no lesion or deformity Hearing: grossly normal Due to Covid-19 pandemic, patient is wearing a mask.  NECK Symmetric: no Trachea: midline Thyroid: There is a dominant firm smooth palpable mass occupying most of the right thyroid lobe and extending beneath the right clavicle. Left thyroid lobe is normal to palpation. There is no palpable lymphadenopathy. There is mild tenderness on the right side.  CHEST Respiratory effort: normal Retraction or accessory muscle use: no Breath sounds: normal bilaterally Rales, rhonchi, wheeze: none  CARDIOVASCULAR Auscultation: regular rhythm, normal rate Murmurs: none Pulses: radial pulse 2+ palpable Lower extremity edema: none  MUSCULOSKELETAL Station and gait: normal Digits and nails: no clubbing or cyanosis Muscle strength: grossly normal all extremities Range of motion: grossly normal all  extremities Deformity: none  LYMPHATIC Cervical: none palpable Supraclavicular: none palpable  PSYCHIATRIC Oriented to person, place, and time: yes Mood and affect: normal for situation Judgment and insight: appropriate for situation    Assessment and Plan:   Papillary thyroid carcinoma (CMS-HCC)  Thyroid nodule  Patient is referred by Dr. Loni Beckwith for surgical evaluation and management of newly diagnosed thyroid carcinoma.  Patient provided with a copy of "The Thyroid Book: Medical and Surgical Treatment of Thyroid Problems", published by [employer], 16 pages. Book reviewed and explained to patient during visit today.  Today we discussed total thyroidectomy as the procedure of choice for management of a large malignant nodule in the right thyroid lobe. We discussed the procedure, the size and location of the surgical incision, the risk and benefits including the risk of recurrent laryngeal nerve injury and injury to parathyroid glands. We discussed the hospital stay to be anticipated. We discussed her postoperative recovery Patient will remain on thyroid hormone indefinitely. Patient may or may not require radioactive iodine treatment postoperatively.  Due to the patient's history of hypertension and congestive heart failure, she will require assessment by cardiology prior to scheduling her for surgery. We will make arrangements for this consultation in the near future.  The risks and benefits of the procedure have been discussed at length with the patient. The patient understands the proposed procedure, potential alternative treatments, and the course of recovery to be expected. All of the patient's questions have been answered at  this time. The patient wishes to proceed with surgery.  Armandina Gemma, MD [facility] A [place] practice Office: [phone]

## 2021-09-15 NOTE — Patient Instructions (Addendum)
DUE TO COVID-19 ONLY TWO VISITORS  (aged 70 and older)  IS ALLOWED TO COME WITH YOU AND STAY IN THE WAITING ROOM ONLY DURING PRE OP AND PROCEDURE.   ?**NO VISITORS ARE ALLOWED IN THE SHORT STAY AREA OR RECOVERY ROOM!!** ? ?IF YOU WILL BE ADMITTED INTO THE HOSPITAL YOU ARE ALLOWED ONLY FOUR SUPPORT PEOPLE DURING VISITATION HOURS ONLY (7 AM -8PM)   ?The support person(s) must pass our screening, gel in and out ?Visitors GUEST BADGE MUST BE WORN VISIBLY  ?One adult visitor may remain with you overnight and MUST be in the room by 8 P.M.  ? ?You are not required to quarantine ?Hand Hygiene often ?Do NOT share personal items ?Notify your provider if you are in close contact with someone who has COVID or you develop fever 100.4 or greater, new onset of sneezing, cough, sore throat, shortness of breath or body aches. ? ?     ? Your procedure is scheduled on:  09-23-21 ? ? Report to [facility] Entrance ? ?  Report to admitting at 5:45 AM ? ? Call this number if you have problems the morning of surgery [phone] ? ? Do not eat food :After Midnight. ? ? After Midnight you may have the following liquids until 5:00 AM DAY OF SURGERY ? ?Water ?Black Coffee (sugar ok, NO MILK/CREAM OR CREAMERS)  ?Tea (sugar ok, NO MILK/CREAM OR CREAMERS) regular and decaf                             ?Plain Jell-O (NO RED)                                           ?Fruit ices (not with fruit pulp, NO RED)                                     ?Popsicles (NO RED)                                                                  ?Juice: apple, WHITE grape, WHITE cranberry ?Sports drinks like Gatorade (NO RED) ?Clear broth(vegetable,chicken,beef) ?            ? ?FOLLOW  ANY ADDITIONAL PRE OP INSTRUCTIONS YOU RECEIVED FROM YOUR SURGEON'S OFFICE!!! ?  ?  ?Oral Hygiene is also important to reduce your risk of infection.                                    ?Remember - BRUSH YOUR TEETH THE MORNING OF SURGERY WITH YOUR REGULAR TOOTHPASTE ? ? Do  NOT smoke after Midnight ? ?Take these medicines the morning of surgery with A SIP OF WATER:  Cetirizine, Duloxetine, Levothyroxine, Loratadine, Pantoprazole, Rosuvastatin.  Okay to use nebulizers and inhalers ? ?How to Manage Your Diabetes ?Before and After Surgery ? ?Why is it important to control my blood sugar before and after surgery? ?Improving blood sugar levels before and  after surgery helps healing and can limit problems. ?A way of improving blood sugar control is eating a healthy diet by: ? Eating less sugar and carbohydrates ? Increasing activity/exercise ? Talking with your doctor about reaching your blood sugar goals ?High blood sugars (greater than 180 mg/dL) can raise your risk of infections and slow your recovery, so you will need to focus on controlling your diabetes during the weeks before surgery. ?Make sure that the doctor who takes care of your diabetes knows about your planned surgery including the date and location. ? ?How do I manage my blood sugar before surgery? ?Check your blood sugar at least 4 times a day, starting 2 days before surgery, to make sure that the level is not too high or low. ?Check your blood sugar the morning of your surgery when you wake up and every 2 hours until you get to the Short Stay unit. ?If your blood sugar is less than 70 mg/dL, you will need to treat for low blood sugar: ?Do not take insulin. ?Treat a low blood sugar (less than 70 mg/dL) with ? cup of clear juice (cranberry or apple), 4 glucose tablets, OR glucose gel. ?Recheck blood sugar in 15 minutes after treatment (to make sure it is greater than 70 mg/dL). If your blood sugar is not greater than 70 mg/dL on recheck, call [phone] for further instructions. ?Report your blood sugar to the short stay nurse when you get to Short Stay. ? ?If you are admitted to the hospital after surgery: ?Your blood sugar will be checked by the staff and you will probably be given insulin after surgery (instead of oral  diabetes medicines) to make sure you have good blood sugar levels. ?The goal for blood sugar control after surgery is 80-180 mg/dL. ? ? ?WHAT DO I DO ABOUT MY DIABETES MEDICATION? ? ?Do not take oral diabetes medicines (pills) the morning of surgery. ? ?THE DAY BEFORE SURGERY:  Take Metformin as prescribed.     ? ? ?THE MORNING OF SURGERY: Do not take Metformin. ? ?Reviewed and Endorsed by [facility] Patient Education Committee, August 2015  ?                  ?           You may not have any metal on your body including hair pins, jewelry, and body piercing ? ?           Do not wear make-up, lotions, powders, perfumes or deodorant ? ?Do not wear nail polish including gel and S&S, artificial/acrylic nails, or any other type of covering on natural nails including finger and toenails. If you have artificial nails, gel coating, etc. that needs to be removed by a nail salon please have this removed prior to surgery or surgery may need to be canceled/ delayed if the surgeon/ anesthesia feels like they are unable to be safely monitored.  ? ?Do not shave  48 hours prior to surgery.  ? ? Do not bring valuables to the hospital. [place]. ? ? Contacts, dentures or bridgework may not be worn into surgery. ? ? Bring small overnight bag day of surgery. ? ?Please read over the following fact sheets you were given: IF [place]  ? ?[place] - Preparing for Surgery ?Before surgery, you can play an important role.  Because skin is not sterile, your skin needs to  be as free of germs as possible.  You can reduce the number of germs on your skin by washing with CHG (chlorahexidine gluconate) soap before surgery.  CHG is an antiseptic cleaner which kills germs and bonds with the skin to continue killing germs even after washing. ?Please DO NOT use if you have an allergy to CHG or antibacterial soaps.  If your skin becomes  reddened/irritated stop using the CHG and inform your nurse when you arrive at Short Stay. ?Do not shave (including legs and underarms) for at least 48 hours prior to the first CHG shower.  You may shave your face/neck. ? ?Please follow these instructions carefully: ? 1.  Shower with CHG Soap the night before surgery and the  morning of surgery. ? 2.  If you choose to wash your hair, wash your hair first as usual with your normal  shampoo. ? 3.  After you shampoo, rinse your hair and body thoroughly to remove the shampoo.                            ? 4.  Use CHG as you would any other liquid soap.  You can apply chg directly to the skin and wash.  Gently with a scrungie or clean washcloth. ? 5.  Apply the CHG Soap to your body ONLY FROM THE NECK DOWN.   Do   not use on face/ open      ?                     Wound or open sores. Avoid contact with eyes, ears mouth and   genitals (private parts).  ?                     Production manager,  Genitals (private parts) with your normal soap. ?            6.  Wash thoroughly, paying special attention to the area where your    surgery  will be performed. ? 7.  Thoroughly rinse your body with warm water from the neck down. ? 8.  DO NOT shower/wash with your normal soap after using and rinsing off the CHG Soap. ?               9.  Pat yourself dry with a clean towel. ?           10.  Wear clean pajamas. ?           11.  Place clean sheets on your bed the night of your first shower and do not  sleep with pets. ?Day of Surgery : ?Do not apply any lotions/deodorants the morning of surgery.  Please wear clean clothes to the hospital/surgery center. ? ?FAILURE TO FOLLOW THESE INSTRUCTIONS MAY RESULT IN THE CANCELLATION OF YOUR SURGERY ? ?PATIENT SIGNATURE_________________________________ ? ?NURSE SIGNATURE__________________________________ ? ?________________________________________________________________________  ?  ?

## 2021-09-15 NOTE — Progress Notes (Addendum)
COVID Vaccine Completed:  No ?Date COVID Vaccine completed: ?Has received booster: ?COVID vaccine manufacturer: [place]  ? ?Date of COVID positive in last 90 days:  No ? ?PCP - Allyn Kenner, MD (scheduled to see on 09-20-21) ?Cardiologist - Dorris Carnes, MD ? ?Cardiac clearance in Epic (telephone note dated 07-21-21) by Dr. Harrington Challenger ? ?Chest x-ray - 09-16-21 Epic ?EKG - 05-17-21 Epic ?Stress Test - 07-12-21 Epic ?ECHO - 07-12-21 Epic ?Cardiac Cath - N/A ?Pacemaker/ICD device last checked: ?Spinal Cord Stimulator: ? ?Bowel Prep - N/A ? ?Sleep Study - No ?CPAP -  ? ?Fasting Blood Sugar - 116 to 120 ?Checks Blood Sugar - 1 time a day ? ?Blood Thinner Instructions: ?Aspirin Instructions:  ASA 81 mg. Has not taken in a month ?Last Dose: ? ?Activity level:   Unable to go up stairs due to hip and back pain.  Able to perform activities of daily living without stopping and without symptoms of chest pain.  Patient does have SOB with exertion.   Able to walk 2 miles daily.  States that she does get SOB but able to complete the walk with the use of her inhaler. Pt lives alone ? ?Anesthesia review: CAD, CHF, cardiomyopathy, COPD, DM. O2 (no longer using) ? ?Had the flu in the beginning of April.  Has some residual cough but states that it is much improved.   ? ?Patient denies shortness of breath, fever, and chest pain at PAT appointment ? ?Patient verbalized understanding of instructions that were given to them at the PAT appointment. Patient was also instructed that they will need to review over the PAT instructions again at home before surgery.  ?

## 2021-09-16 ENCOUNTER — Ambulatory Visit (HOSPITAL_COMMUNITY)
Admission: RE | Admit: 2021-09-16 | Discharge: 2021-09-16 | Disposition: A | Discharge status: Home / Self Care | Payer: Medicare Other | Source: Ambulatory Visit | Attending: Anesthesiology | Admitting: Anesthesiology

## 2021-09-16 ENCOUNTER — Other Ambulatory Visit: Payer: Self-pay

## 2021-09-16 ENCOUNTER — Encounter (HOSPITAL_COMMUNITY)
Admission: RE | Admit: 2021-09-16 | Discharge: 2021-09-16 | Disposition: A | Discharge status: Home / Self Care | Payer: Medicare Other | Source: Ambulatory Visit | Attending: Surgery | Admitting: Surgery

## 2021-09-16 ENCOUNTER — Encounter (HOSPITAL_COMMUNITY): Payer: Self-pay

## 2021-09-16 VITALS — BP 144/95 | HR 104 | Temp 98.9°F | Resp 20 | Ht 60.0 in | Wt 123.6 lb

## 2021-09-16 DIAGNOSIS — I2510 Atherosclerotic heart disease of native coronary artery without angina pectoris: Secondary | ICD-10-CM

## 2021-09-16 DIAGNOSIS — E119 Type 2 diabetes mellitus without complications: Secondary | ICD-10-CM | POA: Diagnosis not present

## 2021-09-16 DIAGNOSIS — Z01818 Encounter for other preprocedural examination: Secondary | ICD-10-CM | POA: Diagnosis not present

## 2021-09-16 HISTORY — DX: Dyspnea, unspecified: R06.00

## 2021-09-16 HISTORY — DX: Anemia, unspecified: D64.9

## 2021-09-16 HISTORY — DX: Gastro-esophageal reflux disease without esophagitis: K21.9

## 2021-09-16 LAB — GLUCOSE, CAPILLARY: Glucose-Capillary: 106 mg/dL — ABNORMAL HIGH (ref 70–99)

## 2021-09-17 NOTE — Progress Notes (Signed)
Anesthesia Chart Review ? ? Case: [id number] Date/Time: 09/23/21 0745  ? Procedure: TOTAL THYROIDECTOMY WITH LIMITED LYMPH NODE DISSECTION  ? Anesthesia type: General  ? Pre-op diagnosis: THYROID CARCINOMA  ? Location: [facility] ROOM 04 / [facility]  ? Surgeons: Armandina Gemma, MD  ? ?  ? ? ?DISCUSSION:70 y.o. smoker with h/o HTN, COPD, ichemic cardiomyopathy, CAD, DM II, OSA, thyroid carcinoma scheduled for above procedure 09/23/2021 with Dr. Armandina Gemma.  ? ?Per cardiology preoperative evaluation, "Dobutamine stress echo is normal   Normal response of heart to increased work ?From cardiac standpoint patient is at low risk for surgery    OK to proceed    ?Please forward to surgeon as well" ? ?Anticipate pt can proceed with planned procedure barring acute status change.   ?VS: BP (!) 144/95   Pulse (!) 104   Temp 37.2 ?C (Oral)   Resp 20   Ht 5' (1.524 m)   Wt 56.1 kg   SpO2 98%   BMI 24.14 kg/m?  ? ?PROVIDERS: ?Celene Squibb, MD is PCP  ? ?Cardiologist - Dorris Carnes, MD ?LABS: Labs reviewed: Acceptable for surgery. ?(all labs ordered are listed, but only abnormal results are displayed) ? ?Labs Reviewed  ?GLUCOSE, CAPILLARY - Abnormal; Notable for the following components:  ?    Result Value  ? Glucose-Capillary 106 (*)   ? All other components within normal limits  ? ? ? ?IMAGES: ? ? ?EKG: ?05/17/2021 ?Rate 90 bpm  ? ?CV: ?Stress Echo 07/12/2021 ?IMPRESSIONS  ? ? ? 1. Resting / stress images normal EF no RWMA Maximum HR 131 bpm with no  ?ischemic changes.  ? 2. This is a negative stress echocardiogram for ischemia.  ? 3. This is a low risk study.  ? ?FINDINGS  ? ?Exam Protocol: Dobutamine was infused in increasing doses starting with 10  ?mcg/kg/min and ending with 30 mcg/kg/min. Handgrip exercises were  ?performed during the dobutamine infusion to augment the heart rate  ?response.  ?   ? ?Echo 05/27/2021 ? 1. Left ventricular ejection fraction, by estimation, is 65 to 70%. The  ?left ventricle has hyperdynamic function. The left  ventricle has no  ?regional wall motion abnormalities. There is mild left ventricular  ?hypertrophy. Left ventricular diastolic  ?parameters are consistent with Grade I diastolic dysfunction (impaired  ?relaxation).  ? 2. Right ventricular systolic function is normal. The right ventricular  ?size is normal. Tricuspid regurgitation signal is inadequate for assessing  ?PA pressure.  ? 3. The mitral valve is normal in structure. Trivial mitral valve  ?regurgitation. No evidence of mitral stenosis.  ? 4. The aortic valve is tricuspid. Aortic valve regurgitation is not  ?visualized. No aortic stenosis is present.  ? 5. Aortic dilatation noted. There is mild dilatation of the ascending  ?aorta, measuring 38 mm.  ? 6. The inferior vena cava is normal in size with greater than 50%  ?respiratory variability, suggesting right atrial pressure of 3 mmHg. ?Past Medical History:  ?Diagnosis Date  ? Acute respiratory failure ([place]) 06/2011  ? Vent dependent sec to COPD/PNA  ? Anemia   ? Anxiety   ? Anxiety disorder, unspecified   ? Arthritis   ? Arthritis   ? Atherosclerotic heart disease of native coronary artery with unspecified angina pectoris ([place])   ? Back pain   ? Bronchitis   ? CHF (congestive heart failure) ([place])   ? Chronic obstructive pulmonary disease with (acute) exacerbation (HCC)   ? Chronic obstructive pulmonary disease,  unspecified ([place])   ? Chronic obstructive pulmonary disease, unspecified ([place])   ? COPD (chronic obstructive pulmonary disease) ([place])   ? Coronary artery disease   ? Demand ischemia of myocardium 06/28/2011  ? Depression   ? Diabetes mellitus without complication ([place])   ? Dizziness and giddiness   ? Dyspnea   ? Edema, unspecified   ? GERD (gastroesophageal reflux disease)   ? Hyperlipidemia   ? Hyperlipidemia, unspecified   ? Hypertension   ? Hypertensive heart disease without heart failure   ? Hypothyroidism, unspecified   ? Insomnia, unspecified   ? Ischemic cardiomyopathy 06/28/2011  ? EF 25%.  Myoview stress test later showed ejection fraction of 65%  ? Lumbago   ? Major depressive disorder, single episode, unspecified   ? MI, acute, non ST segment elevation ([place]) 06/29/2011  ? Myoview stress test revealed mild perfusion defect  ? Myocardial infarction [facility])   ? Neuralgia and neuritis, unspecified   ? Nicotine dependence, unspecified, uncomplicated   ? Obstructive chronic bronchitis with acute bronchitis ([place])   ? Other acute osteomyelitis, unspecified ankle and foot ([place])   ? Overactive bladder   ? Oxygen deficiency   ? PNA (pneumonia) 06/29/2011  ? Pneumonia   ? Pneumonia   ? Primary thyroid malignancy ([place]) 03/26/2021  ? Rectocele 10/09/2012  ? Substance abuse ([place])   ? MANY YEARS AGO  ? Symptomatic menopausal or female climacteric states   ? Thyroid enlargement 06/28/2011  ? Type 2 diabetes mellitus with diabetic peripheral angiopathy without gangrene ([place])   ? Type 2 diabetes mellitus with hyperglycemia (HCC)   ? Unspecified convulsions ([place])   ? ? ?Past Surgical History:  ?Procedure Laterality Date  ? COLONOSCOPY  Feb 2013  ? Dr. Michail Sermon: internal hemorrhoids, normal view of ileum, multiple hyperplastic polyps  ? ESOPHAGOGASTRODUODENOSCOPY  Feb 2013  ? Dr. Michail Sermon: normal duodenum, candida esophagitis, reactive gastropathy  ? TUBAL LIGATION    ? ? ?MEDICATIONS: ? albuterol (PROVENTIL) (2.5 MG/3ML) 0.083% nebulizer solution  ? aspirin EC 81 MG tablet  ? blood glucose meter kit and supplies KIT  ? BREZTRI AEROSPHERE 160-9-4.8 MCG/ACT AERO  ? cetirizine (ZYRTEC) 10 MG tablet  ? cetirizine HCl (ZYRTEC) 1 MG/ML solution  ? cyclobenzaprine (FLEXERIL) 10 MG tablet  ? DULoxetine (CYMBALTA) 20 MG capsule  ? DULoxetine (CYMBALTA) 60 MG capsule  ? ergocalciferol (VITAMIN D2) 1.25 MG (50000 UT) capsule  ? furosemide (LASIX) 20 MG tablet  ? [place] ([place]) LIQD  ? guaiFENesin (MUCINEX) 600 MG 12 hr tablet  ? Incontinence Supply Disposable (POISE PANTILINERS) PADS  ? levothyroxine (SYNTHROID) 50 MCG tablet   ? levothyroxine (SYNTHROID) 75 MCG tablet  ? metFORMIN (GLUCOPHAGE) 500 MG tablet  ? naproxen sodium (ALEVE) 220 MG tablet  ? OXYGEN  ? pantoprazole (PROTONIX) 40 MG tablet  ? potassium chloride (K-DUR) 10 MEQ tablet  ? rosuvastatin (CRESTOR) 20 MG tablet  ? ?No current facility-administered medications for this encounter.  ? ? ? ?Konrad Felix Ward, PA-C ?[facility] ?[phone] ? ? ? ? ? ?

## 2021-09-17 NOTE — Anesthesia Preprocedure Evaluation (Addendum)
Anesthesia Evaluation  Patient identified by MRN, date of birth, ID band Patient awake    Reviewed: Allergy & Precautions, NPO status , Patient's Chart, lab work & pertinent test results  Airway Mallampati: II  TM Distance: >3 FB Neck ROM: Full    Dental  (+) Edentulous Upper, Edentulous Lower   Pulmonary COPD,  COPD inhaler, Current Smoker,  Previously on oxygen in the distant past  Chest muscle pain from recent viral illness with prolonged cough; also, costochondritis symptoms  Decreased breath sounds bilaterally, no wheezing noted; occasional residual cough   + decreased breath sounds      Cardiovascular Exercise Tolerance: Poor METS: 3 - Mets hypertension, + angina + CAD, + Past MI, + Peripheral Vascular Disease and +CHF  Normal cardiovascular exam Rhythm:Regular Rate:Normal  Echo 05/27/2021 1. Left ventricular ejection fraction, by estimation, is 65 to 70%. The  left ventricle has hyperdynamic function. The left ventricle has no  regional wall motion abnormalities. There is mild left ventricular  hypertrophy. Left ventricular diastolic  parameters are consistent with Grade I diastolic dysfunction (impaired  relaxation).  2. Right ventricular systolic function is normal. The right ventricular  size is normal. Tricuspid regurgitation signal is inadequate for assessing  PA pressure.  3. The mitral valve is normal in structure. Trivial mitral valve  regurgitation. No evidence of mitral stenosis.  4. The aortic valve is tricuspid. Aortic valve regurgitation is not  visualized. No aortic stenosis is present.  5. Aortic dilatation noted. There is mild dilatation of the ascending  aorta, measuring 38 mm.  6. The inferior vena cava is normal in size with greater than 50%  respiratory variability, suggesting right atrial pressure of 3 mmHg.   Neuro/Psych PSYCHIATRIC DISORDERS Anxiety Depression negative neurological ROS   negative psych ROS   GI/Hepatic Neg liver ROS, GERD  Medicated,  Endo/Other  diabetes, Type 2, Oral Hypoglycemic AgentsHypothyroidism   Renal/GU negative Renal ROS  negative genitourinary   Musculoskeletal negative musculoskeletal ROS (+) Arthritis , Osteoarthritis,    Abdominal   Peds negative pediatric ROS (+)  Hematology negative hematology ROS (+)   Anesthesia Other Findings   Reproductive/Obstetrics negative OB ROS                           Anesthesia Physical Anesthesia Plan  ASA: 4  Anesthesia Plan: General   Post-op Pain Management: Tylenol PO (pre-op)*   Induction: Intravenous  PONV Risk Score and Plan: 2 and Treatment may vary due to age or medical condition, Ondansetron and Dexamethasone  Airway Management Planned: Oral ETT  Additional Equipment: None  Intra-op Plan:   Post-operative Plan: Extubation in OR  Informed Consent: I have reviewed the patients History and Physical, chart, labs and discussed the procedure including the risks, benefits and alternatives for the proposed anesthesia with the patient or authorized representative who has indicated his/her understanding and acceptance.     Dental advisory given  Plan Discussed with: Anesthesiologist, Surgeon and CRNA  Anesthesia Plan Comments: (Discussed she made need oxygen post-operatively and even re-started on home O2 until she recovers. Norton Blizzard, MD  )       Anesthesia Quick Evaluation

## 2021-09-20 ENCOUNTER — Encounter (HOSPITAL_COMMUNITY): Payer: Self-pay | Admitting: Surgery

## 2021-09-23 ENCOUNTER — Encounter (HOSPITAL_COMMUNITY): Payer: Self-pay | Admitting: Surgery

## 2021-09-23 ENCOUNTER — Ambulatory Visit (HOSPITAL_COMMUNITY)
Admission: RE | Admit: 2021-09-23 | Discharge: 2021-09-24 | Disposition: A | Discharge status: Home / Self Care | Payer: Medicare Other | Attending: Surgery | Admitting: Surgery

## 2021-09-23 ENCOUNTER — Ambulatory Visit (HOSPITAL_COMMUNITY): Payer: Medicare Other | Admitting: Physician Assistant

## 2021-09-23 ENCOUNTER — Other Ambulatory Visit: Payer: Self-pay

## 2021-09-23 ENCOUNTER — Encounter (HOSPITAL_COMMUNITY)
Admission: RE | Disposition: A | Discharge status: Home / Self Care | Payer: Self-pay | Source: Home / Self Care | Attending: Surgery

## 2021-09-23 ENCOUNTER — Ambulatory Visit (HOSPITAL_BASED_OUTPATIENT_CLINIC_OR_DEPARTMENT_OTHER): Payer: Medicare Other | Admitting: Anesthesiology

## 2021-09-23 DIAGNOSIS — I252 Old myocardial infarction: Secondary | ICD-10-CM | POA: Diagnosis not present

## 2021-09-23 DIAGNOSIS — I509 Heart failure, unspecified: Secondary | ICD-10-CM | POA: Insufficient documentation

## 2021-09-23 DIAGNOSIS — C771 Secondary and unspecified malignant neoplasm of intrathoracic lymph nodes: Secondary | ICD-10-CM | POA: Diagnosis not present

## 2021-09-23 DIAGNOSIS — E063 Autoimmune thyroiditis: Secondary | ICD-10-CM | POA: Diagnosis not present

## 2021-09-23 DIAGNOSIS — C770 Secondary and unspecified malignant neoplasm of lymph nodes of head, face and neck: Secondary | ICD-10-CM | POA: Diagnosis not present

## 2021-09-23 DIAGNOSIS — K219 Gastro-esophageal reflux disease without esophagitis: Secondary | ICD-10-CM | POA: Diagnosis not present

## 2021-09-23 DIAGNOSIS — F419 Anxiety disorder, unspecified: Secondary | ICD-10-CM | POA: Diagnosis not present

## 2021-09-23 DIAGNOSIS — I110 Hypertensive heart disease with heart failure: Secondary | ICD-10-CM | POA: Diagnosis not present

## 2021-09-23 DIAGNOSIS — J449 Chronic obstructive pulmonary disease, unspecified: Secondary | ICD-10-CM | POA: Insufficient documentation

## 2021-09-23 DIAGNOSIS — I2510 Atherosclerotic heart disease of native coronary artery without angina pectoris: Secondary | ICD-10-CM | POA: Diagnosis not present

## 2021-09-23 DIAGNOSIS — C73 Malignant neoplasm of thyroid gland: Secondary | ICD-10-CM

## 2021-09-23 DIAGNOSIS — Z79890 Hormone replacement therapy (postmenopausal): Secondary | ICD-10-CM | POA: Diagnosis not present

## 2021-09-23 DIAGNOSIS — E039 Hypothyroidism, unspecified: Secondary | ICD-10-CM | POA: Insufficient documentation

## 2021-09-23 DIAGNOSIS — F17210 Nicotine dependence, cigarettes, uncomplicated: Secondary | ICD-10-CM | POA: Diagnosis not present

## 2021-09-23 DIAGNOSIS — E041 Nontoxic single thyroid nodule: Secondary | ICD-10-CM | POA: Diagnosis present

## 2021-09-23 DIAGNOSIS — I25119 Atherosclerotic heart disease of native coronary artery with unspecified angina pectoris: Secondary | ICD-10-CM | POA: Diagnosis not present

## 2021-09-23 DIAGNOSIS — E1151 Type 2 diabetes mellitus with diabetic peripheral angiopathy without gangrene: Secondary | ICD-10-CM | POA: Insufficient documentation

## 2021-09-23 DIAGNOSIS — F32A Depression, unspecified: Secondary | ICD-10-CM | POA: Diagnosis not present

## 2021-09-23 DIAGNOSIS — Z7984 Long term (current) use of oral hypoglycemic drugs: Secondary | ICD-10-CM | POA: Insufficient documentation

## 2021-09-23 HISTORY — PX: THYROIDECTOMY: SHX17

## 2021-09-23 LAB — HEMOGLOBIN A1C
Hgb A1c MFr Bld: 6.1 % — ABNORMAL HIGH (ref 4.8–5.6)
Mean Plasma Glucose: 128.37 mg/dL

## 2021-09-23 LAB — GLUCOSE, CAPILLARY
Glucose-Capillary: 111 mg/dL — ABNORMAL HIGH (ref 70–99)
Glucose-Capillary: 171 mg/dL — ABNORMAL HIGH (ref 70–99)
Glucose-Capillary: 165 mg/dL — ABNORMAL HIGH (ref 70–99)
Glucose-Capillary: 118 mg/dL — ABNORMAL HIGH (ref 70–99)
Glucose-Capillary: 168 mg/dL — ABNORMAL HIGH (ref 70–99)

## 2021-09-23 SURGERY — THYROIDECTOMY
Anesthesia: General

## 2021-09-23 MED ORDER — OXYCODONE HCL 5 MG PO TABS: 5.0000 mg | ORAL_TABLET | Freq: Once | ORAL | Status: DC | PRN

## 2021-09-23 MED ORDER — ONDANSETRON 4 MG PO TBDP: 4.0000 mg | ORAL_TABLET | Freq: Four times a day (QID) | ORAL | Status: DC | PRN

## 2021-09-23 MED ORDER — CHLORHEXIDINE GLUCONATE 0.12 % MT SOLN
15.0000 mL | Freq: Once | OROMUCOSAL | Status: AC
  Administered 2021-09-23: 15 mL via OROMUCOSAL

## 2021-09-23 MED ORDER — CHLORHEXIDINE GLUCONATE CLOTH 2 % EX PADS: 6.0000 | MEDICATED_PAD | Freq: Once | CUTANEOUS | Status: DC

## 2021-09-23 MED ORDER — CALCIUM CARBONATE 1250 (500 CA) MG PO TABS
2.0000 | ORAL_TABLET | Freq: Three times a day (TID) | ORAL | Status: DC
Start: 2021-09-23 — End: 2021-09-24
  Administered 2021-09-23 – 2021-09-24 (×3): 1000 mg via ORAL
  Filled 2021-09-23 (×3): qty 1

## 2021-09-23 MED ORDER — DEXMEDETOMIDINE (PRECEDEX) IN NS 20 MCG/5ML (4 MCG/ML) IV SYRINGE
PREFILLED_SYRINGE | INTRAVENOUS | Status: DC | PRN
  Administered 2021-09-23: 8 ug via INTRAVENOUS

## 2021-09-23 MED ORDER — SODIUM CHLORIDE 0.45 % IV SOLN: INTRAVENOUS | Status: DC

## 2021-09-23 MED ORDER — LIDOCAINE HCL (PF) 2 % IJ SOLN
INTRAMUSCULAR | Status: AC
  Filled 2021-09-23: qty 5

## 2021-09-23 MED ORDER — HYDROMORPHONE HCL 1 MG/ML IJ SOLN: 1.0000 mg | INTRAMUSCULAR | Status: DC | PRN

## 2021-09-23 MED ORDER — OXYCODONE HCL 5 MG PO TABS
5.0000 mg | ORAL_TABLET | ORAL | Status: DC | PRN
  Administered 2021-09-23 – 2021-09-24 (×3): 10 mg via ORAL
  Filled 2021-09-23 (×3): qty 2

## 2021-09-23 MED ORDER — DEXMEDETOMIDINE (PRECEDEX) IN NS 20 MCG/5ML (4 MCG/ML) IV SYRINGE
PREFILLED_SYRINGE | INTRAVENOUS | Status: AC
  Filled 2021-09-23: qty 5

## 2021-09-23 MED ORDER — FENTANYL CITRATE PF 50 MCG/ML IJ SOSY
25.0000 ug | PREFILLED_SYRINGE | INTRAMUSCULAR | Status: DC | PRN
  Administered 2021-09-23: 50 ug via INTRAVENOUS
  Administered 2021-09-23 (×2): 25 ug via INTRAVENOUS

## 2021-09-23 MED ORDER — ORAL CARE MOUTH RINSE
15.0000 mL | Freq: Two times a day (BID) | OROMUCOSAL | Status: DC
  Administered 2021-09-23 – 2021-09-24 (×3): 15 mL via OROMUCOSAL

## 2021-09-23 MED ORDER — SUCCINYLCHOLINE CHLORIDE 200 MG/10ML IV SOSY
PREFILLED_SYRINGE | INTRAVENOUS | Status: AC
  Filled 2021-09-23: qty 10

## 2021-09-23 MED ORDER — ROCURONIUM BROMIDE 10 MG/ML (PF) SYRINGE
PREFILLED_SYRINGE | INTRAVENOUS | Status: AC
  Filled 2021-09-23: qty 10

## 2021-09-23 MED ORDER — DEXAMETHASONE SODIUM PHOSPHATE 10 MG/ML IJ SOLN
INTRAMUSCULAR | Status: AC
  Filled 2021-09-23: qty 1

## 2021-09-23 MED ORDER — OXYCODONE HCL 5 MG/5ML PO SOLN: 5.0000 mg | Freq: Once | ORAL | Status: DC | PRN

## 2021-09-23 MED ORDER — MIDAZOLAM HCL 2 MG/2ML IJ SOLN
INTRAMUSCULAR | Status: AC
  Filled 2021-09-23: qty 2

## 2021-09-23 MED ORDER — PROPOFOL 10 MG/ML IV BOLUS
INTRAVENOUS | Status: DC | PRN
  Administered 2021-09-23: 110 mg via INTRAVENOUS

## 2021-09-23 MED ORDER — ACETAMINOPHEN 325 MG PO TABS
650.0000 mg | ORAL_TABLET | Freq: Four times a day (QID) | ORAL | Status: DC | PRN
Start: 2021-09-23 — End: 2021-09-24
  Administered 2021-09-23 – 2021-09-24 (×2): 650 mg via ORAL
  Filled 2021-09-23 (×2): qty 2

## 2021-09-23 MED ORDER — PHENYLEPHRINE 80 MCG/ML (10ML) SYRINGE FOR IV PUSH (FOR BLOOD PRESSURE SUPPORT)
PREFILLED_SYRINGE | INTRAVENOUS | Status: DC | PRN
  Administered 2021-09-23: 160 ug via INTRAVENOUS
  Administered 2021-09-23: 80 ug via INTRAVENOUS
  Administered 2021-09-23: 160 ug via INTRAVENOUS
  Administered 2021-09-23: 80 ug via INTRAVENOUS

## 2021-09-23 MED ORDER — AMISULPRIDE (ANTIEMETIC) 5 MG/2ML IV SOLN: 10.0000 mg | Freq: Once | INTRAVENOUS | Status: DC | PRN

## 2021-09-23 MED ORDER — LACTATED RINGERS IV SOLN: INTRAVENOUS | Status: DC

## 2021-09-23 MED ORDER — ROCURONIUM BROMIDE 100 MG/10ML IV SOLN
INTRAVENOUS | Status: DC | PRN
  Administered 2021-09-23: 70 mg via INTRAVENOUS

## 2021-09-23 MED ORDER — ONDANSETRON HCL 4 MG/2ML IJ SOLN
INTRAMUSCULAR | Status: AC
  Filled 2021-09-23: qty 2

## 2021-09-23 MED ORDER — ONDANSETRON HCL 4 MG/2ML IJ SOLN
4.0000 mg | Freq: Four times a day (QID) | INTRAMUSCULAR | Status: DC | PRN
Start: 2021-09-23 — End: 2021-09-24

## 2021-09-23 MED ORDER — INSULIN ASPART 100 UNIT/ML IJ SOLN: 0.0000 [IU] | Freq: Three times a day (TID) | INTRAMUSCULAR | Status: DC

## 2021-09-23 MED ORDER — CYCLOBENZAPRINE HCL 10 MG PO TABS
10.0000 mg | ORAL_TABLET | Freq: Two times a day (BID) | ORAL | Status: DC
  Administered 2021-09-23 – 2021-09-24 (×2): 10 mg via ORAL
  Filled 2021-09-23 (×2): qty 1

## 2021-09-23 MED ORDER — SODIUM CHLORIDE (PF) 0.9 % IJ SOLN
INTRAMUSCULAR | Status: AC
  Filled 2021-09-23: qty 10

## 2021-09-23 MED ORDER — FENTANYL CITRATE PF 50 MCG/ML IJ SOSY
PREFILLED_SYRINGE | INTRAMUSCULAR | Status: AC
  Administered 2021-09-23: 50 ug via INTRAVENOUS
  Filled 2021-09-23: qty 3

## 2021-09-23 MED ORDER — ACETAMINOPHEN 650 MG RE SUPP: 650.0000 mg | Freq: Four times a day (QID) | RECTAL | Status: DC | PRN

## 2021-09-23 MED ORDER — FENTANYL CITRATE (PF) 250 MCG/5ML IJ SOLN
INTRAMUSCULAR | Status: DC | PRN
  Administered 2021-09-23 (×2): 50 ug via INTRAVENOUS

## 2021-09-23 MED ORDER — CEFAZOLIN SODIUM-DEXTROSE 2-4 GM/100ML-% IV SOLN
2.0000 g | INTRAVENOUS | Status: AC
  Administered 2021-09-23: 2 g via INTRAVENOUS

## 2021-09-23 MED ORDER — LEVOTHYROXINE SODIUM 75 MCG PO TABS
75.0000 ug | ORAL_TABLET | Freq: Every day | ORAL | Status: DC
  Administered 2021-09-24: 75 ug via ORAL
  Filled 2021-09-23: qty 1

## 2021-09-23 MED ORDER — MIDAZOLAM HCL 2 MG/2ML IJ SOLN
INTRAMUSCULAR | Status: DC | PRN
  Administered 2021-09-23 (×2): 1 mg via INTRAVENOUS

## 2021-09-23 MED ORDER — PROPOFOL 10 MG/ML IV BOLUS
INTRAVENOUS | Status: AC
Start: 2021-09-23 — End: ?
  Filled 2021-09-23: qty 20

## 2021-09-23 MED ORDER — CEFAZOLIN SODIUM-DEXTROSE 2-4 GM/100ML-% IV SOLN
INTRAVENOUS | Status: AC
  Filled 2021-09-23: qty 100

## 2021-09-23 MED ORDER — FENTANYL CITRATE (PF) 250 MCG/5ML IJ SOLN
INTRAMUSCULAR | Status: AC
  Filled 2021-09-23: qty 5

## 2021-09-23 MED ORDER — POTASSIUM CHLORIDE ER 10 MEQ PO TBCR
10.0000 meq | EXTENDED_RELEASE_TABLET | Freq: Every day | ORAL | Status: DC
  Administered 2021-09-23 – 2021-09-24 (×2): 10 meq via ORAL
  Filled 2021-09-23 (×4): qty 1

## 2021-09-23 MED ORDER — ACETAMINOPHEN 500 MG PO TABS
ORAL_TABLET | ORAL | Status: AC
  Administered 2021-09-23: 1000 mg via ORAL
  Filled 2021-09-23: qty 2

## 2021-09-23 MED ORDER — HEMOSTATIC AGENTS (NO CHARGE) OPTIME
TOPICAL | Status: DC | PRN
  Administered 2021-09-23: 1 via TOPICAL

## 2021-09-23 MED ORDER — LIDOCAINE 2% (20 MG/ML) 5 ML SYRINGE
INTRAMUSCULAR | Status: DC | PRN
  Administered 2021-09-23: 60 mg via INTRAVENOUS
  Administered 2021-09-23: 40 mg via INTRAVENOUS

## 2021-09-23 MED ORDER — ORAL CARE MOUTH RINSE: 15.0000 mL | Freq: Once | OROMUCOSAL | Status: AC

## 2021-09-23 MED ORDER — 0.9 % SODIUM CHLORIDE (POUR BTL) OPTIME
TOPICAL | Status: DC | PRN
  Administered 2021-09-23: 1000 mL

## 2021-09-23 MED ORDER — DEXAMETHASONE SODIUM PHOSPHATE 10 MG/ML IJ SOLN
INTRAMUSCULAR | Status: DC | PRN
  Administered 2021-09-23: 10 mg via INTRAVENOUS

## 2021-09-23 MED ORDER — METFORMIN HCL 500 MG PO TABS
500.0000 mg | ORAL_TABLET | Freq: Two times a day (BID) | ORAL | Status: DC
  Administered 2021-09-23 – 2021-09-24 (×2): 500 mg via ORAL
  Filled 2021-09-23 (×2): qty 1

## 2021-09-23 MED ORDER — LIDOCAINE HCL 4 % MT SOLN
OROMUCOSAL | Status: DC | PRN
  Administered 2021-09-23: 5 mL via TOPICAL

## 2021-09-23 MED ORDER — FENTANYL CITRATE PF 50 MCG/ML IJ SOSY
PREFILLED_SYRINGE | INTRAMUSCULAR | Status: AC
  Filled 2021-09-23: qty 1

## 2021-09-23 MED ORDER — PANTOPRAZOLE SODIUM 40 MG PO TBEC
40.0000 mg | DELAYED_RELEASE_TABLET | Freq: Every day | ORAL | Status: DC
  Administered 2021-09-24: 40 mg via ORAL
  Filled 2021-09-23: qty 1

## 2021-09-23 MED ORDER — ONDANSETRON HCL 4 MG/2ML IJ SOLN
INTRAMUSCULAR | Status: DC | PRN
  Administered 2021-09-23: 4 mg via INTRAVENOUS

## 2021-09-23 MED ORDER — FUROSEMIDE 20 MG PO TABS
20.0000 mg | ORAL_TABLET | Freq: Every day | ORAL | Status: DC
  Administered 2021-09-24: 20 mg via ORAL
  Filled 2021-09-23: qty 1

## 2021-09-23 MED ORDER — SUGAMMADEX SODIUM 200 MG/2ML IV SOLN
INTRAVENOUS | Status: DC | PRN
  Administered 2021-09-23: 125 mg via INTRAVENOUS

## 2021-09-23 MED ORDER — TRAMADOL HCL 50 MG PO TABS: 50.0000 mg | ORAL_TABLET | Freq: Four times a day (QID) | ORAL | Status: DC | PRN

## 2021-09-23 MED ORDER — ONDANSETRON HCL 4 MG/2ML IJ SOLN: 4.0000 mg | Freq: Once | INTRAMUSCULAR | Status: DC | PRN

## 2021-09-23 MED ORDER — DULOXETINE HCL 60 MG PO CPEP
60.0000 mg | ORAL_CAPSULE | Freq: Every day | ORAL | Status: DC
  Administered 2021-09-24: 60 mg via ORAL
  Filled 2021-09-23: qty 1

## 2021-09-23 MED ORDER — ACETAMINOPHEN 500 MG PO TABS: 1000.0000 mg | ORAL_TABLET | Freq: Once | ORAL | Status: AC

## 2021-09-23 SURGICAL SUPPLY — 36 items
ADH SKN CLS APL DERMABOND .7 (GAUZE/BANDAGES/DRESSINGS) ×1
APL PRP STRL LF DISP 70% ISPRP (MISCELLANEOUS) ×1
ATTRACTOMAT 16X20 MAGNETIC DRP (DRAPES) ×2 IMPLANT
BAG COUNTER SPONGE SURGICOUNT (BAG) ×2 IMPLANT
BAG SPNG CNTER NS LX DISP (BAG) ×1
BLADE SURG 15 STRL LF DISP TIS (BLADE) ×1 IMPLANT
BLADE SURG 15 STRL SS (BLADE) ×2
CHLORAPREP W/TINT 26 (MISCELLANEOUS) ×2 IMPLANT
CLIP TI MEDIUM 6 (CLIP) ×4 IMPLANT
CLIP TI WIDE RED SMALL 6 (CLIP) ×6 IMPLANT
COVER SURGICAL LIGHT HANDLE (MISCELLANEOUS) ×2 IMPLANT
DERMABOND ADVANCED (GAUZE/BANDAGES/DRESSINGS) ×1
DERMABOND ADVANCED .7 DNX12 (GAUZE/BANDAGES/DRESSINGS) ×1 IMPLANT
DRAPE LAPAROTOMY T 98X78 PEDS (DRAPES) ×2 IMPLANT
DRAPE UTILITY XL STRL (DRAPES) ×2 IMPLANT
ELECT PENCIL ROCKER SW 15FT (MISCELLANEOUS) ×2 IMPLANT
ELECT REM PT RETURN 15FT ADLT (MISCELLANEOUS) ×2 IMPLANT
GAUZE 4X4 16PLY ~~LOC~~+RFID DBL (SPONGE) ×2 IMPLANT
GLOVE SURG ORTHO 8.0 STRL STRW (GLOVE) ×2 IMPLANT
GLOVE SURG SYN 7.5  E (GLOVE) ×6
GLOVE SURG SYN 7.5 E (GLOVE) ×3 IMPLANT
GLOVE SURG SYN 7.5 PF PI (GLOVE) ×3 IMPLANT
GOWN STRL REUS W/ TWL XL LVL3 (GOWN DISPOSABLE) ×2 IMPLANT
GOWN STRL REUS W/TWL XL LVL3 (GOWN DISPOSABLE) ×4
HEMOSTAT SURGICEL 2X4 FIBR (HEMOSTASIS) ×2 IMPLANT
ILLUMINATOR WAVEGUIDE N/F (MISCELLANEOUS) ×2 IMPLANT
KIT BASIN OR (CUSTOM PROCEDURE TRAY) ×2 IMPLANT
KIT TURNOVER KIT A (KITS) ×1 IMPLANT
PACK BASIC VI WITH GOWN DISP (CUSTOM PROCEDURE TRAY) ×2 IMPLANT
SHEARS HARMONIC 9CM CVD (BLADE) ×2 IMPLANT
SUT MNCRL AB 4-0 PS2 18 (SUTURE) ×2 IMPLANT
SUT VIC AB 3-0 SH 18 (SUTURE) ×4 IMPLANT
SYR BULB IRRIG 60ML STRL (SYRINGE) ×2 IMPLANT
TOWEL OR 17X26 10 PK STRL BLUE (TOWEL DISPOSABLE) ×2 IMPLANT
TOWEL OR NON WOVEN STRL DISP B (DISPOSABLE) ×2 IMPLANT
TUBING CONNECTING 10 (TUBING) ×2 IMPLANT

## 2021-09-23 NOTE — Transfer of Care (Signed)
Immediate Anesthesia Transfer of Care Note  Patient: Kristin Wells  Procedure(s) Performed: TOTAL THYROIDECTOMY WITH LIMITED LYMPH NODE DISSECTION  Patient Location: PACU  Anesthesia Type:General  Level of Consciousness: drowsy  Airway & Oxygen Therapy: Patient Spontanous Breathing and Patient connected to face mask oxygen  Post-op Assessment: Report given to RN and Post -op Vital signs reviewed and stable  Post vital signs: Reviewed and stable  Last Vitals:  Vitals Value Taken Time  BP 182/128 09/23/21 0957  Temp    Pulse 92 09/23/21 0958  Resp 18 09/23/21 0958  SpO2 98 % 09/23/21 0958  Vitals shown include unvalidated device data.  Last Pain:  Vitals:   09/23/21 0647  TempSrc:   PainSc: 0-No pain         Complications: No notable events documented.

## 2021-09-23 NOTE — Interval H&P Note (Signed)
History and Physical Interval Note:  09/23/2021 7:44 AM  Kristin Wells  has presented today for surgery, with the diagnosis of THYROID CARCINOMA.  The various methods of treatment have been discussed with the patient and family. After consideration of risks, benefits and other options for treatment, the patient has consented to    Procedure(s): TOTAL THYROIDECTOMY WITH LIMITED LYMPH NODE DISSECTION (N/A) as a surgical intervention.    The patient's history has been reviewed, patient examined, no change in status, stable for surgery.  I have reviewed the patient's chart and labs.  Questions were answered to the patient's satisfaction.    Armandina Gemma, [facility] Office: [place]

## 2021-09-23 NOTE — Anesthesia Postprocedure Evaluation (Signed)
Anesthesia Post Note  Patient: Kristin Wells  Procedure(s) Performed: TOTAL THYROIDECTOMY WITH LIMITED LYMPH NODE DISSECTION     Patient location during evaluation: PACU Anesthesia Type: General Level of consciousness: awake and alert Pain management: pain level controlled Vital Signs Assessment: post-procedure vital signs reviewed and stable Respiratory status: spontaneous breathing, nonlabored ventilation, respiratory function stable and patient connected to nasal cannula oxygen Cardiovascular status: blood pressure returned to baseline and stable Postop Assessment: no apparent nausea or vomiting Anesthetic complications: no   No notable events documented.  Last Vitals:  Vitals:   09/23/21 1058 09/23/21 1125  BP:  (!) 159/102  Pulse: 88 87  Resp: (!) 22 18  Temp: (!) 36.4 C 36.6 C  SpO2: 98% 98%    Last Pain:  Vitals:   09/23/21 1125  TempSrc: Oral  PainSc:                  Merlinda Frederick

## 2021-09-23 NOTE — Op Note (Signed)
Procedure Note  Pre-operative Diagnosis:  papillary thyroid carcinoma  Post-operative Diagnosis:  same  Surgeon:  Armandina Gemma, MD  Assistant:  Mohammed Kindle, RN   Procedure:  Total thyroidectomy with limited central compartment lymph node dissection  Anesthesia:  General  Estimated Blood Loss:  minimal  Drains: none         Specimen: thyroid to pathology; lymph nodes to pathology  Indications:  Patient is referred by Dr. Loni Beckwith for surgical evaluation and management of newly diagnosed thyroid cancer. Patient's primary care physician is Dr. Delphina Cahill. Patient was found to have a right sided thyroid nodule. She underwent a thyroid ultrasound on October 29, 2020. This showed a normal-sized thyroid gland with a dominant nodule in the right superior pole measuring 3.9 x 3.3 x 2.8 cm. Fine-needle aspiration biopsy was recommended. This was performed on March 17, 2021. Cytopathology demonstrated malignant cells, Bethesda category VI. Patient was seen in consultation by Dr. Loni Beckwith.   Procedure Details: Procedure was done in OR #4 at the [facility]. The patient was brought to the operating room and placed in a supine position on the operating room table. Following administration of general anesthesia, the patient was positioned and then prepped and draped in the usual aseptic fashion. After ascertaining that an adequate level of anesthesia had been achieved, a small Kocher incision was made with #15 blade. Dissection was carried through subcutaneous tissues and platysma.Hemostasis was achieved with the electrocautery. Skin flaps were elevated cephalad and caudad from the thyroid notch to the sternal notch. A [place] self-retaining retractor was placed for exposure. Strap muscles were incised in the midline and dissection was begun on the left side.  Strap muscles were reflected laterally.  Left thyroid lobe was quite small and atrophic.  The left lobe was gently mobilized  with blunt dissection. Superior pole vessels were dissected out and divided individually between small and medium ligaclips with the harmonic scalpel. The thyroid lobe was rolled anteriorly. Branches of the inferior thyroid artery were divided between small ligaclips with the harmonic scalpel. Inferior venous tributaries were divided between ligaclips. Both the superior and inferior parathyroid glands were identified and preserved on their vascular pedicles. The recurrent laryngeal nerve was identified and preserved along its course. The ligament of Gwenlyn Found was released with the electrocautery and the gland was mobilized onto the anterior trachea. Isthmus was mobilized across the midline. There was no significant pyramidal lobe present. Dry pack was placed in the left neck.  The right thyroid lobe was gently mobilized with blunt dissection. Right thyroid lobe was markedly enlarged with a dominant firm mass. Superior pole vessels were dissected out and divided between small and medium ligaclips with the Harmonic scalpel. Superior parathyroid was identified and preserved. Inferior venous tributaries were divided between medium ligaclips with the harmonic scalpel. The right thyroid lobe was rolled anteriorly and the branches of the inferior thyroid artery divided between small ligaclips. The right recurrent laryngeal nerve was identified and preserved along its course. The ligament of Gwenlyn Found was released with the electrocautery. The right thyroid lobe was mobilized onto the anterior trachea and the remainder of the thyroid was dissected off the anterior trachea and the thyroid was completely excised. A suture was used to mark the right lobe. The entire thyroid gland was submitted to pathology for review.  In the central compartment there were palpable abnormal lymph nodes likely containing tumor.  Tissue was dissected out in the central compartment anterior to the trachea.  Care was  taken to avoid the inferior  parathyroid glands and the recurrent laryngeal nerves.  Harmonic scalpel was used for dissection and hemostasis.  At least 2 large firm lymph nodes likely containing tumor were resected along with additional tissue from the central compartment.  This was submitted separately to pathology for review.  The neck was irrigated with warm saline. Fibrillar was placed throughout the operative field. Strap muscles were approximated in the midline with interrupted 3-0 Vicryl sutures. Platysma was closed with interrupted 3-0 Vicryl sutures. Skin was closed with a running 4-0 Monocryl subcuticular suture. Wound was washed and Dermabond was applied. The patient was awakened from anesthesia and brought to the recovery room. The patient tolerated the procedure well.   Armandina Gemma, MD [facility] Office: [phone]

## 2021-09-23 NOTE — Anesthesia Procedure Notes (Signed)
Procedure Name: Intubation Date/Time: 09/23/2021 8:10 AM Performed by: Sharlette Dense, CRNA Pre-anesthesia Checklist: Patient identified, Emergency Drugs available, Suction available and Patient being monitored Patient Re-evaluated:Patient Re-evaluated prior to induction Oxygen Delivery Method: Circle system utilized Preoxygenation: Pre-oxygenation with 100% oxygen Induction Type: IV induction Ventilation: Mask ventilation without difficulty Laryngoscope Size: Miller and 2 Grade View: Grade I Tube type: Reinforced Tube size: 7.0 mm Number of attempts: 1 Airway Equipment and Method: Stylet and Oral airway Placement Confirmation: ETT inserted through vocal cords under direct vision, positive ETCO2 and breath sounds checked- equal and bilateral Secured at: 20 cm Tube secured with: Tape Dental Injury: Teeth and Oropharynx as per pre-operative assessment

## 2021-09-24 ENCOUNTER — Encounter (HOSPITAL_COMMUNITY): Payer: Self-pay | Admitting: Surgery

## 2021-09-24 DIAGNOSIS — F32A Depression, unspecified: Secondary | ICD-10-CM | POA: Diagnosis not present

## 2021-09-24 DIAGNOSIS — J449 Chronic obstructive pulmonary disease, unspecified: Secondary | ICD-10-CM | POA: Diagnosis not present

## 2021-09-24 DIAGNOSIS — C770 Secondary and unspecified malignant neoplasm of lymph nodes of head, face and neck: Secondary | ICD-10-CM | POA: Diagnosis not present

## 2021-09-24 DIAGNOSIS — E039 Hypothyroidism, unspecified: Secondary | ICD-10-CM | POA: Diagnosis not present

## 2021-09-24 DIAGNOSIS — E063 Autoimmune thyroiditis: Secondary | ICD-10-CM | POA: Diagnosis not present

## 2021-09-24 DIAGNOSIS — I509 Heart failure, unspecified: Secondary | ICD-10-CM | POA: Diagnosis not present

## 2021-09-24 DIAGNOSIS — F17210 Nicotine dependence, cigarettes, uncomplicated: Secondary | ICD-10-CM | POA: Diagnosis not present

## 2021-09-24 DIAGNOSIS — E1151 Type 2 diabetes mellitus with diabetic peripheral angiopathy without gangrene: Secondary | ICD-10-CM | POA: Diagnosis not present

## 2021-09-24 DIAGNOSIS — I252 Old myocardial infarction: Secondary | ICD-10-CM | POA: Diagnosis not present

## 2021-09-24 DIAGNOSIS — C73 Malignant neoplasm of thyroid gland: Secondary | ICD-10-CM | POA: Diagnosis not present

## 2021-09-24 DIAGNOSIS — I110 Hypertensive heart disease with heart failure: Secondary | ICD-10-CM | POA: Diagnosis not present

## 2021-09-24 DIAGNOSIS — Z7984 Long term (current) use of oral hypoglycemic drugs: Secondary | ICD-10-CM | POA: Diagnosis not present

## 2021-09-24 DIAGNOSIS — I2510 Atherosclerotic heart disease of native coronary artery without angina pectoris: Secondary | ICD-10-CM | POA: Diagnosis not present

## 2021-09-24 DIAGNOSIS — K219 Gastro-esophageal reflux disease without esophagitis: Secondary | ICD-10-CM | POA: Diagnosis not present

## 2021-09-24 LAB — BASIC METABOLIC PANEL
Anion gap: 7 (ref 5–15)
BUN: 22 mg/dL (ref 8–23)
CO2: 29 mmol/L (ref 22–32)
Calcium: 9 mg/dL (ref 8.9–10.3)
Chloride: 101 mmol/L (ref 98–111)
Creatinine, Ser: 1.49 mg/dL — ABNORMAL HIGH (ref 0.44–1.00)
GFR, Estimated: 38 mL/min — ABNORMAL LOW (ref >60)
Glucose, Bld: 128 mg/dL — ABNORMAL HIGH (ref 70–99)
Potassium: 4.8 mmol/L (ref 3.5–5.1)
Sodium: 137 mmol/L (ref 135–145)

## 2021-09-24 LAB — GLUCOSE, CAPILLARY
Glucose-Capillary: 107 mg/dL — ABNORMAL HIGH (ref 70–99)
Glucose-Capillary: 109 mg/dL — ABNORMAL HIGH (ref 70–99)

## 2021-09-24 MED ORDER — CALCIUM CARBONATE ANTACID 500 MG PO CHEW: 2.0000 | CHEWABLE_TABLET | Freq: Two times a day (BID) | ORAL | 1 refills | Status: AC

## 2021-09-24 MED ORDER — TRAMADOL HCL 50 MG PO TABS: 50.0000 mg | ORAL_TABLET | Freq: Four times a day (QID) | ORAL | 0 refills | Status: DC | PRN

## 2021-09-24 NOTE — Discharge Summary (Signed)
Physician Discharge Summary   Patient ID: Kristin Wells MRN: [id number] DOB/AGE: July 03, 1950 71 y.o.  Admit date: 09/23/2021  Discharge date: 09/24/2021  Discharge Diagnoses:  Principal Problem:   Papillary thyroid carcinoma [facility]) Active Problems:   Thyroid nodule   Discharged Condition: good  Hospital Course: Patient was admitted for observation following thyroid surgery.  Post op course was uncomplicated.  Pain was well controlled.  Tolerated diet.  Post op calcium level on morning following surgery was 9.0 mg/dl.  Patient was prepared for discharge home on POD#1.  Consults: None  Treatments: surgery: total thyroidectomy with limited lymph node dissection  Discharge Exam: Blood pressure 131/85, pulse 99, temperature 97.8 F (36.6 C), resp. rate 18, height 5' (1.524 m), weight 56.1 kg, SpO2 96 %. HEENT - clear Neck - wound dry and intact; mild STS; voice normal  Disposition: Home  Discharge Instructions     Diet - low sodium heart healthy   Complete by: As directed    Increase activity slowly   Complete by: As directed    No dressing needed   Complete by: As directed       Allergies as of 09/24/2021       Reactions   Cat Hair Extract Cough   sneezing   Other Cough   Coughing and sneezing d/t some cats, dogs and perfumes        Medication List     TAKE these medications    albuterol (2.5 MG/3ML) 0.083% nebulizer solution Commonly known as: PROVENTIL Take 3 mLs (2.5 mg total) by nebulization every 6 (six) hours as needed for wheezing or shortness of breath.   aspirin EC 81 MG tablet Take 81 mg by mouth daily.   blood glucose meter kit and supplies Kit Dispense based on patient and insurance preference. Use up to four times daily as directed. (FOR ICD-9 250.00, 250.01).   Breztri Aerosphere 160-9-4.8 MCG/ACT Aero Generic drug: Budeson-Glycopyrrol-Formoterol Inhale into the lungs.   calcium carbonate 500 MG chewable tablet Commonly known as:  Tums Chew 2 tablets (400 mg of elemental calcium total) by mouth 2 (two) times daily.   cetirizine 10 MG tablet Commonly known as: ZYRTEC Take 10 mg by mouth daily.   cetirizine HCl 1 MG/ML solution Commonly known as: ZYRTEC Take 10 mLs (10 mg total) by mouth daily.   cyclobenzaprine 10 MG tablet Commonly known as: FLEXERIL Take 10 mg by mouth 2 (two) times daily.   DULoxetine 20 MG capsule Commonly known as: CYMBALTA TAKE 1 CAPSULE BY MOUTH EVERY DAY.   DULoxetine 60 MG capsule Commonly known as: CYMBALTA Take 60 mg by mouth daily.   ergocalciferol 1.25 MG (50000 UT) capsule Commonly known as: VITAMIN D2 Take 50,000 Units by mouth once a week. Thursday   furosemide 20 MG tablet Commonly known as: LASIX Take 1 tablet (20 mg total) by mouth daily.   Glucerna Liqd Take 237 mLs by mouth 3 (three) times daily between meals.   guaiFENesin 600 MG 12 hr tablet Commonly known as: MUCINEX Take 1 tablet (600 mg total) by mouth 2 (two) times daily.   levothyroxine 75 MCG tablet Commonly known as: SYNTHROID Take 75 mcg by mouth daily.   metFORMIN 500 MG tablet Commonly known as: GLUCOPHAGE Take 500 mg by mouth 2 (two) times daily with a meal.   naproxen sodium 220 MG tablet Commonly known as: ALEVE Take 220 mg by mouth daily as needed (pain).   OXYGEN Inhale 2 L into the  lungs daily as needed.   pantoprazole 40 MG tablet Commonly known as: PROTONIX Take 40 mg by mouth daily.   Poise Pantiliners Pads 1 each by Does not apply route as needed.   potassium chloride 10 MEQ tablet Commonly known as: KLOR-CON Take 1 tablet (10 mEq total) by mouth daily.   rosuvastatin 20 MG tablet Commonly known as: CRESTOR Take 1 tablet (20 mg total) by mouth daily.   traMADol 50 MG tablet Commonly known as: ULTRAM Take 1-2 tablets (50-100 mg total) by mouth every 6 (six) hours as needed for moderate pain.               Discharge Care Instructions  (From admission,  onward)           Start     Ordered   09/24/21 0000  No dressing needed        09/24/21 1133            Follow-up Information     Armandina Gemma, MD. Schedule an appointment as soon as possible for a visit in 3 week(s).   Specialty: General Surgery Why: For wound re-check Contact information: [place] [phone]                 Jamillah Camilo, [facility] Office: [phone]   Signed: Armandina Gemma 09/24/2021, 11:34 AM

## 2021-09-24 NOTE — Discharge Instructions (Signed)
[facility] - Dr. Luisfelipe Engelstad  THYROID & PARATHYROID SURGERY:  POST-OP INSTRUCTIONS  Always review the instruction sheet provided by the hospital nurse at discharge.  A prescription for pain medication may be sent to your pharmacy at the time of discharge.  Take your pain medication as prescribed.  If narcotic pain medicine is not needed, then you may take acetaminophen (Tylenol) or ibuprofen (Advil) as needed for pain or soreness.  Take your normal home medications as prescribed unless otherwise directed.  If you need a refill on your pain medication, please contact the office during regular business hours.  Prescriptions will not be processed by the office after 5:00PM or on weekends.  Start with a light diet upon arrival home, such as soup and crackers or toast.  Be sure to drink plenty of fluids.  Resume your normal diet the day after surgery.  Most patients will experience some swelling and bruising on the chest and neck area.  Ice packs will help for the first 48 hours after arriving home.  Swelling and bruising will take several days to resolve.   It is common to experience some constipation after surgery.  Increasing fluid intake and taking a stool softener (Colace) will usually help to prevent this problem.  A mild laxative (Milk of Magnesia or Miralax) should be taken according to package directions if there has been no bowel movement after 48 hours.  Dermabond glue covers your incision. This seals the wound and you may shower at any time. The Dermabond will remain in place for about a week.  You may gradually remove the glue when it loosens around the edges.  If you need to loosen the Dermabond for removal, apply a layer of Vaseline to the wound for 15 minutes and then remove with a Kleenex. Your sutures are under the skin and will not show - they will dissolve on their own.  You may resume light daily activities beginning the day after discharge (such as self-care,  walking, climbing stairs), gradually increasing activities as tolerated. You may have sexual intercourse when it is comfortable. Refrain from any heavy lifting or straining until approved by your doctor. You may drive when you no longer are taking prescription pain medication, you can comfortably wear a seatbelt, and you can safely maneuver your car and apply the brakes.  You will see your doctor in the office for a follow-up appointment approximately three weeks after your surgery.  Make sure that you call for this appointment within a day or two after you arrive home to insure a convenient appointment time. Please have any requested laboratory tests performed a few days prior to your office visit so that the results will be available at your follow up appointment.  WHEN TO CALL THE [facility] OFFICE: -- Fever greater than 101.5 -- Inability to urinate -- Nausea and/or vomiting - persistent -- Extreme swelling or bruising -- Continued bleeding from incision -- Increased pain, redness, or drainage from the incision -- Difficulty swallowing or breathing -- Muscle cramping or spasms -- Numbness or tingling in hands or around lips  The clinic staff is available to answer your questions during regular business hours.  Please don't hesitate to call and ask to speak to one of the nurses if you have concerns.  [facility] OFFICE: [phone] (24 hours)  Please sign up for MyChart accounts. This will allow you to communicate directly with my nurse or myself without having to call the office. It will also allow you   to view your test results. You will need to enroll in MyChart for my office ([facility]) and for the hospital ([facility]).  Kristin Aleshire, MD [facility] 

## 2021-09-24 NOTE — Plan of Care (Signed)
  Problem: Education: Goal: Knowledge of General Education information will improve Description: Including pain rating scale, medication(s)/side effects and non-pharmacologic comfort measures Outcome: Progressing   Problem: Clinical Measurements: Goal: Ability to maintain clinical measurements within normal limits will improve Outcome: Progressing   Problem: Coping: Goal: Level of anxiety will decrease Outcome: Progressing   Problem: Pain Managment: Goal: General experience of comfort will improve Outcome: Progressing   Problem: Safety: Goal: Ability to remain free from injury will improve Outcome: Progressing   Problem: Clinical Measurements: Goal: Postoperative complications will be avoided or minimized Outcome: Progressing

## 2021-09-27 LAB — SURGICAL PATHOLOGY

## 2021-09-28 NOTE — Progress Notes (Signed)
Final pathology is medullary thyroid carcinoma with nodal metastasis.  Will notify patient and order calcitonin level today.  Claiborne Billings - please have patient go to lab for calcitonin level and CEA level as soon as possible.  [place], MD [facility] [place] [facility] Office: [phone]

## 2021-10-03 DIAGNOSIS — J9690 Respiratory failure, unspecified, unspecified whether with hypoxia or hypercapnia: Secondary | ICD-10-CM | POA: Diagnosis not present

## 2021-10-03 DIAGNOSIS — J449 Chronic obstructive pulmonary disease, unspecified: Secondary | ICD-10-CM | POA: Diagnosis not present

## 2021-10-03 DIAGNOSIS — M47897 Other spondylosis, lumbosacral region: Secondary | ICD-10-CM | POA: Diagnosis not present

## 2021-10-07 DIAGNOSIS — E782 Mixed hyperlipidemia: Secondary | ICD-10-CM | POA: Diagnosis not present

## 2021-10-07 DIAGNOSIS — R000 Tachycardia, unspecified: Secondary | ICD-10-CM | POA: Diagnosis not present

## 2021-10-07 DIAGNOSIS — I509 Heart failure, unspecified: Secondary | ICD-10-CM | POA: Diagnosis not present

## 2021-10-07 DIAGNOSIS — R809 Proteinuria, unspecified: Secondary | ICD-10-CM | POA: Diagnosis not present

## 2021-10-07 DIAGNOSIS — J449 Chronic obstructive pulmonary disease, unspecified: Secondary | ICD-10-CM | POA: Diagnosis not present

## 2021-10-07 DIAGNOSIS — E039 Hypothyroidism, unspecified: Secondary | ICD-10-CM | POA: Diagnosis not present

## 2021-10-07 DIAGNOSIS — M47897 Other spondylosis, lumbosacral region: Secondary | ICD-10-CM | POA: Diagnosis not present

## 2021-10-07 DIAGNOSIS — E1165 Type 2 diabetes mellitus with hyperglycemia: Secondary | ICD-10-CM | POA: Diagnosis not present

## 2021-10-07 DIAGNOSIS — N1831 Chronic kidney disease, stage 3a: Secondary | ICD-10-CM | POA: Diagnosis not present

## 2021-10-07 DIAGNOSIS — I10 Essential (primary) hypertension: Secondary | ICD-10-CM | POA: Diagnosis not present

## 2021-10-07 DIAGNOSIS — J9690 Respiratory failure, unspecified, unspecified whether with hypoxia or hypercapnia: Secondary | ICD-10-CM | POA: Diagnosis not present

## 2021-10-07 DIAGNOSIS — K219 Gastro-esophageal reflux disease without esophagitis: Secondary | ICD-10-CM | POA: Diagnosis not present

## 2021-10-13 ENCOUNTER — Telehealth: Payer: Self-pay | Admitting: "Endocrinology

## 2021-10-13 NOTE — Telephone Encounter (Signed)
Called patient and left a VM that DR Nida wants to see her ASAP - he said he will see her without the labs just schedule (overbook).

## 2021-10-14 NOTE — Telephone Encounter (Signed)
Pt called back and states her legs are hurting and she needs to give transportation time to know what day, she scheduled for June 15th

## 2021-10-14 NOTE — Progress Notes (Signed)
Yes she called back and we scheduled her an appt

## 2021-10-21 ENCOUNTER — Ambulatory Visit (INDEPENDENT_AMBULATORY_CARE_PROVIDER_SITE_OTHER): Payer: Medicare Other | Admitting: "Endocrinology

## 2021-10-21 ENCOUNTER — Telehealth: Payer: Self-pay | Admitting: "Endocrinology

## 2021-10-21 ENCOUNTER — Encounter: Payer: Self-pay | Admitting: "Endocrinology

## 2021-10-21 VITALS — BP 92/68 | HR 114 | Ht 60.0 in | Wt 120.0 lb

## 2021-10-21 DIAGNOSIS — F17200 Nicotine dependence, unspecified, uncomplicated: Secondary | ICD-10-CM

## 2021-10-21 DIAGNOSIS — E890 Postprocedural hypothyroidism: Secondary | ICD-10-CM | POA: Diagnosis not present

## 2021-10-21 DIAGNOSIS — E119 Type 2 diabetes mellitus without complications: Secondary | ICD-10-CM

## 2021-10-21 DIAGNOSIS — C73 Malignant neoplasm of thyroid gland: Secondary | ICD-10-CM | POA: Diagnosis not present

## 2021-10-21 NOTE — Progress Notes (Signed)
10/21/2021, 4:56 PM  Endocrinology follow-up note   Subjective:    Patient ID: Kristin Wells, female    DOB: January 25, 1951, PCP Celene Squibb, MD   Past Medical History:  Diagnosis Date   Acute respiratory failure ([place]) 06/2011   Vent dependent sec to COPD/PNA   Anemia    Anxiety    Anxiety disorder, unspecified    Arthritis    Arthritis    Atherosclerotic heart disease of native coronary artery with unspecified angina pectoris (HCC)    Back pain    Bronchitis    CHF (congestive heart failure) (HCC)    Chronic obstructive pulmonary disease with (acute) exacerbation (HCC)    Chronic obstructive pulmonary disease, unspecified (HCC)    Chronic obstructive pulmonary disease, unspecified (HCC)    COPD (chronic obstructive pulmonary disease) ([place])    Coronary artery disease    Demand ischemia of myocardium 06/28/2011   Depression    Diabetes mellitus without complication (HCC)    Dizziness and giddiness    Dyspnea    Edema, unspecified    GERD (gastroesophageal reflux disease)    Hyperlipidemia    Hyperlipidemia, unspecified    Hypertension    Hypertensive heart disease without heart failure    Hypothyroidism, unspecified    Insomnia, unspecified    Ischemic cardiomyopathy 06/28/2011   EF 25%. Myoview stress test later showed ejection fraction of 65%   Lumbago    Major depressive disorder, single episode, unspecified    MI, acute, non ST segment elevation (HCC) 06/29/2011   Myoview stress test revealed mild perfusion defect   Myocardial infarction [facility])    Neuralgia and neuritis, unspecified    Nicotine dependence, unspecified, uncomplicated    Obstructive chronic bronchitis with acute bronchitis ([place])    Other acute osteomyelitis, unspecified ankle and foot (HCC)    Overactive bladder    Oxygen deficiency    PNA (pneumonia) 06/29/2011   Pneumonia    Pneumonia    Primary thyroid malignancy ([place]) 03/26/2021    Rectocele 10/09/2012   Substance abuse ([place])    MANY YEARS AGO   Symptomatic menopausal or female climacteric states    Thyroid enlargement 06/28/2011   Type 2 diabetes mellitus with diabetic peripheral angiopathy without gangrene (HCC)    Type 2 diabetes mellitus with hyperglycemia ([place])    Unspecified convulsions ([place])    Past Surgical History:  Procedure Laterality Date   COLONOSCOPY  Feb 2013   Dr. Michail Sermon: internal hemorrhoids, normal view of ileum, multiple hyperplastic polyps   ESOPHAGOGASTRODUODENOSCOPY  Feb 2013   Dr. Michail Sermon: normal duodenum, candida esophagitis, reactive gastropathy   THYROIDECTOMY N/A 09/23/2021   Procedure: TOTAL THYROIDECTOMY WITH LIMITED LYMPH NODE DISSECTION;  Surgeon: Armandina Gemma, MD;  Location: [facility];  Service: General;  Laterality: N/A;   TUBAL LIGATION     Social History   Socioeconomic History   Marital status: Single    Spouse name: Not on file   Number of children: Not on file   Years of education: Not on file   Highest education level: Not on file  Occupational History   Not on file  Tobacco Use   Smoking status: Every Day    Packs/day: 0.50  Years: 49.00    Total pack years: 24.50    Types: Cigarettes    Start date: 05/10/1967   Smokeless tobacco: Never   Tobacco comments:    START 1969  Vaping Use   Vaping Use: Never used  Substance and Sexual Activity   Alcohol use: No    Alcohol/week: 0.0 standard drinks of alcohol   Drug use: No   Sexual activity: Never    Birth control/protection: None, Post-menopausal  Other Topics Concern   Not on file  Social History Narrative   Not on file   Social Determinants of Health   Financial Resource Strain: Not on file  Food Insecurity: Not on file  Transportation Needs: Not on file  Physical Activity: Not on file  Stress: Not on file  Social Connections: Not on file   Family History  Problem Relation Age of Onset   Diabetes Mother    Hypertension Mother    Diabetes Father     Hypertension Father    Heart disease Father        HEART FAILURE   Skin cancer Brother        [place] type   Alcohol abuse Brother    Seizures Sister    Uterine cancer Sister    Early death Sister        Pneumonia at 25 months old   Early death Son    Drug abuse Son    Heart attack Son    Cancer Maternal Uncle    Cirrhosis Brother    Colon cancer Neg Hx    Outpatient Encounter Medications as of 10/21/2021  Medication Sig   GLUCERNA (GLUCERNA) LIQD Take 237 mLs by mouth 3 (three) times daily between meals.   guaiFENesin (MUCINEX) 600 MG 12 hr tablet Take 1 tablet (600 mg total) by mouth 2 (two) times daily.   aspirin EC 81 MG tablet Take 81 mg by mouth daily. (Patient not taking: Reported on 09/16/2021)   blood glucose meter kit and supplies KIT Dispense based on patient and insurance preference. Use up to four times daily as directed. (FOR ICD-9 250.00, 250.01).   BREZTRI AEROSPHERE 160-9-4.8 MCG/ACT AERO Inhale into the lungs.   calcium carbonate (TUMS) 500 MG chewable tablet Chew 2 tablets (400 mg of elemental calcium total) by mouth 2 (two) times daily.   cetirizine (ZYRTEC) 10 MG tablet Take 10 mg by mouth daily.   cyclobenzaprine (FLEXERIL) 10 MG tablet Take 10 mg by mouth 2 (two) times daily.   DULoxetine (CYMBALTA) 60 MG capsule Take 60 mg by mouth daily.   ergocalciferol (VITAMIN D2) 1.25 MG (50000 UT) capsule Take 50,000 Units by mouth once a week. Thursday   furosemide (LASIX) 20 MG tablet Take 1 tablet (20 mg total) by mouth daily.   Incontinence Supply Disposable (POISE PANTILINERS) PADS 1 each by Does not apply route as needed.   levothyroxine (SYNTHROID) 75 MCG tablet Take 75 mcg by mouth daily.   metFORMIN (GLUCOPHAGE) 500 MG tablet Take 500 mg by mouth 2 (two) times daily with a meal.    naproxen sodium (ALEVE) 220 MG tablet Take 220 mg by mouth daily as needed (pain). (Patient not taking: Reported on 10/21/2021)   pantoprazole (PROTONIX) 40 MG tablet Take 40 mg by  mouth daily.   potassium chloride (K-DUR) 10 MEQ tablet Take 1 tablet (10 mEq total) by mouth daily.   rosuvastatin (CRESTOR) 20 MG tablet Take 1 tablet (20 mg total) by mouth daily.   traMADol Veatrice Bourbon)  50 MG tablet Take 1-2 tablets (50-100 mg total) by mouth every 6 (six) hours as needed for moderate pain.   [DISCONTINUED] albuterol (PROVENTIL) (2.5 MG/3ML) 0.083% nebulizer solution Take 3 mLs (2.5 mg total) by nebulization every 6 (six) hours as needed for wheezing or shortness of breath. (Patient not taking: Reported on 09/16/2021)   [DISCONTINUED] cetirizine HCl (ZYRTEC) 1 MG/ML solution Take 10 mLs (10 mg total) by mouth daily. (Patient not taking: Reported on 09/16/2021)   [DISCONTINUED] DULoxetine (CYMBALTA) 20 MG capsule TAKE 1 CAPSULE BY MOUTH EVERY DAY. (Patient not taking: Reported on 10/21/2021)   [DISCONTINUED] OXYGEN Inhale 2 L into the lungs daily as needed. (Patient not taking: Reported on 09/16/2021)   No facility-administered encounter medications on file as of 10/21/2021.   ALLERGIES: Allergies  Allergen Reactions   Cat Hair Extract Cough    sneezing   Other Cough    Coughing and sneezing d/t some cats, dogs and perfumes    VACCINATION STATUS: Immunization History  Administered Date(s) Administered   Influenza Inj Mdck Quad Pf 04/29/2016   Influenza Split 06/29/2011   Influenza,inj,Quad PF,6+ Mos 01/30/2013, 05/25/2017   Influenza-Unspecified 02/20/2014, 02/17/2015, 04/29/2016, 04/01/2019   PPD Test 08/29/2012   Pneumococcal Conjugate-13 05/25/2017   Pneumococcal Polysaccharide-23 06/29/2011, 04/01/2019    HPI UDELL BLASINGAME is 71 y.o. female who presents today with a medical history as above. she was seen in June 2022 for hypothyroidism and found to have goiter.  She has a serious non-adherence to follow up and recommendations. After long absence from clinic, she reappeared last November with a solitary nodule in the right lobe with malignant cytology. She was  referred to surgery. She had to have several medical evaluations due to her high risk status. She underwent surgery in may 2023, revealing a locally invasive medullary thyroid cancer. Her levothyroxine was increased to 75 mcg po q day. She is accompanied by her care taker today. She does not have recent thyroid function test and she did not do her labs for calcitonin and CEA. She remains a heavy  chronic smoker.  She reports fatigue.  She has well-controlled diabetes, hypertension, hyperlipidemia. Her medical history is complicated by coronary artery disease, COPD, stage 2 renal insufficiency,Major depression. She reports significantly fluctuating body weight.  Shortness of breath on exertion.  she denies dysphagia, nor voice change.      Review of Systems  Constitutional: +fluctuating body weight, + fatigue, no subjective hyperthermia, no subjective hypothermia Eyes: no blurry vision, no xerophthalmia ENT: no sore throat, no nodules palpated in throat  Objective:       10/21/2021    9:22 AM 09/24/2021    9:23 AM 09/24/2021    6:09 AM  Vitals with BMI  Height 5\' 0"     Weight 120 lbs    BMI 23.44    Systolic 92 131 98  Diastolic 68 85 56  Pulse 114 99 73    BP 92/68   Pulse (!) 114   Ht 5' (1.524 m)   Wt 120 lb (54.4 kg)   BMI 23.44 kg/m   Wt Readings from Last 3 Encounters:  10/21/21 120 lb (54.4 kg)  09/23/21 123 lb 9.6 oz (56.1 kg)  09/16/21 123 lb 9.6 oz (56.1 kg)    Physical Exam  Constitutional:  Body mass index is 23.44 kg/m.,  not in acute distress, normal state of mind Eyes: PERRLA, EOMI, no exophthalmos ENT: moist mucous membranes, + gross thyromegaly, + gross cervical lymphadenopathy  CMP ( most recent) CMP     Component Value Date/Time   NA 137 09/24/2021 0443   K 4.8 09/24/2021 0443   CL 101 09/24/2021 0443   CO2 29 09/24/2021 0443   GLUCOSE 128 (H) 09/24/2021 0443   BUN 22 09/24/2021 0443   BUN 10 08/19/2020 0000   CREATININE 1.49 (H) 09/24/2021  0443   CREATININE 0.97 08/22/2017 1415   CALCIUM 9.0 09/24/2021 0443   PROT 6.4 08/22/2017 1415   ALBUMIN 4.2 08/19/2020 0000   AST 9 (L) 08/22/2017 1415   ALT 11 08/22/2017 1415   ALKPHOS 66 01/31/2017 0457   BILITOT 0.4 08/22/2017 1415   GFRNONAA 38 (L) 09/24/2021 0443   GFRNONAA 61 08/22/2017 1415   GFRAA 71 08/22/2017 1415     Diabetic Labs (most recent): Lab Results  Component Value Date   HGBA1C 6.1 (H) 09/23/2021   HGBA1C 5.9 08/19/2020   HGBA1C 5.8 (H) 08/22/2017   MICROALBUR 0.5 08/22/2017   MICROALBUR 1.0 08/19/2016     Lipid Panel ( most recent) Lipid Panel     Component Value Date/Time   CHOL 143 08/19/2020 0000   TRIG 125 08/19/2020 0000   HDL 46 08/19/2020 0000   CHOLHDL 2.4 08/22/2017 1415   VLDL 22 08/19/2016 1435   LDLCALC 75 08/19/2020 0000   LDLCALC 57 08/22/2017 1415      Lab Results  Component Value Date   TSH 6.595 (H) 02/19/2021   TSH 4.99 08/19/2020   TSH 4.54 (H) 08/22/2017   TSH 2.83 08/19/2016   TSH 4.65 06/19/2013   TSH 3.199 10/04/2012   TSH 1.325 06/28/2011   TSH 0.982 01/09/2010   TSH 0.982 01/09/2010   FREET4 1.14 (H) 02/19/2021   FREET4 1.00 06/28/2011    Thyroid ultrasound on February 01, 2021 IMPRESSION: Solitary right superior thyroid nodule (labeled 1, 3.9 cm) which meets criteria (TI-RADS category 5) for tissue sampling. Recommend ultrasound-guided fine-needle aspiration.    Fine-needle aspiration of thyroid nodule on the right lobe on March 21, 2021 FINAL MICROSCOPIC DIAGNOSIS:  - Malignant cells present (Bethesda category VI)   SPECIMEN ADEQUACY:  Satisfactory for evaluation .  09/23/2021  FINAL MICROSCOPIC DIAGNOSIS:   A. THYROID, TOTAL THYROIDECTOMY:  Medullary thyroid gastric cancer is absent  (?) carcinoma  Tumor is confined to the right lobe and measures 4.5 x 3.4 x 3.5 cm  (pT3a)  Margins free ,  Background chronic lymphocytic thyroiditis   B. LYMPH NODES, CENTRAL COMPARTMENT, ZONE VI,  REGIONAL RESECTION:  Five of eight lymph nodes with metastatic medullary carcinoma (5/8,  pN1a)   Assessment & Plan:   1. Hypothyroidism due to   Hashimoto's thyroiditis and recent thyroidectomy 2.   Medullary thyroid cancer.  I discussed her surgical findings with her and her care taker. She is s/p total thyroidectomy . Surgical pathology showing pT3a MTC confined to the right lobe, margins free, background chronic lymphocytic thyroiditis. 5 Lymph nodes with metastatic MTC. She needs adjuvant therapy. She is not a candidate for RAI. She will be referred to Oncology. She will be sent to lab today for calcitonin, CEA, TFTs. She will also be sent for CT neck and chest with contrast.  She is advised to maintain her current dose of levothyroxine 75 mcg p.o. daily before breakfast.   - We discussed about the correct intake of her thyroid hormone, on empty stomach at fasting, with water, separated by at least 30 minutes from breakfast and other medications,  and separated by more  than 4 hours from calcium, iron, multivitamins, acid reflux medications (PPIs). -Patient is made aware of the fact that thyroid hormone replacement is needed for life, dose to be adjusted by periodic monitoring of thyroid function tests.  The patient was counseled on the dangers of tobacco use, and was advised to quit.  Reviewed strategies to maximize success, including removing cigarettes and smoking materials from environment.    She has well-controlled type 2 diabetes with A1c of 6.1%, will not need any medications for diabetes at this time.  - I did not initiate any new prescriptions today. - she is advised to maintain close follow up with Celene Squibb, MD for primary care needs.   I spent 33 minutes in the care of the patient today including review of labs from Thyroid Function, CMP, and other relevant labs ; imaging/biopsy records (current and previous including abstractions from other facilities); face-to-face  time discussing  her lab results and symptoms, medications doses, her options of short and long term treatment based on the latest standards of care / guidelines;   and documenting the encounter.  Kristin Wells  participated in the discussions, expressed understanding, and voiced agreement with the above plans.  All questions were answered to her satisfaction. she is encouraged to contact clinic should she have any questions or concerns prior to her return visit.   Follow up plan: Return in about 4 weeks (around 11/18/2021), or CT anytime, for Labs Today- Non-Fasting Ok.   Glade Lloyd, MD [facility] [address] [place], [place] [address] Phone: [phone]  Fax: [phone]     10/21/2021, 4:56 PM  This note was partially dictated with voice recognition software. Similar sounding words can be transcribed inadequately or may not  be corrected upon review.

## 2021-10-21 NOTE — Telephone Encounter (Signed)
Noted  

## 2021-10-21 NOTE — Telephone Encounter (Signed)
Patient transportation/caregiver Joslyn Devon was with patient at appointment today and when her CT appointments are scheduled she would also like to be called at [phone] so she can make sure patient makes it to the appointment.

## 2021-10-28 ENCOUNTER — Inpatient Hospital Stay (HOSPITAL_COMMUNITY): Payer: Medicare Other | Attending: Hematology | Admitting: Hematology

## 2021-10-28 ENCOUNTER — Inpatient Hospital Stay (HOSPITAL_COMMUNITY): Payer: Medicare Other

## 2021-10-28 VITALS — BP 141/85 | HR 116 | Temp 98.1°F | Resp 18 | Ht 60.0 in | Wt 125.5 lb

## 2021-10-28 DIAGNOSIS — C73 Malignant neoplasm of thyroid gland: Secondary | ICD-10-CM | POA: Insufficient documentation

## 2021-10-28 DIAGNOSIS — F17210 Nicotine dependence, cigarettes, uncomplicated: Secondary | ICD-10-CM | POA: Diagnosis not present

## 2021-10-28 DIAGNOSIS — Z809 Family history of malignant neoplasm, unspecified: Secondary | ICD-10-CM | POA: Diagnosis not present

## 2021-10-28 DIAGNOSIS — R634 Abnormal weight loss: Secondary | ICD-10-CM | POA: Diagnosis not present

## 2021-10-28 DIAGNOSIS — Z79899 Other long term (current) drug therapy: Secondary | ICD-10-CM | POA: Diagnosis not present

## 2021-10-28 LAB — CBC WITH DIFFERENTIAL/PLATELET
Abs Immature Granulocytes: 0.02 10*3/uL (ref 0.00–0.07)
Basophils Absolute: 0 10*3/uL (ref 0.0–0.1)
Basophils Relative: 0 %
Eosinophils Absolute: 0.1 10*3/uL (ref 0.0–0.5)
Eosinophils Relative: 1 %
HCT: 45.8 % (ref 36.0–46.0)
Hemoglobin: 14.5 g/dL (ref 12.0–15.0)
Immature Granulocytes: 0 %
Lymphocytes Relative: 28 %
Lymphs Abs: 2.4 10*3/uL (ref 0.7–4.0)
MCH: 27.4 pg (ref 26.0–34.0)
MCHC: 31.7 g/dL (ref 30.0–36.0)
MCV: 86.4 fL (ref 80.0–100.0)
Monocytes Absolute: 0.5 10*3/uL (ref 0.1–1.0)
Monocytes Relative: 5 %
Neutro Abs: 5.6 10*3/uL (ref 1.7–7.7)
Neutrophils Relative %: 66 %
Platelets: 305 10*3/uL (ref 150–400)
RBC: 5.3 MIL/uL — ABNORMAL HIGH (ref 3.87–5.11)
RDW: 14.4 % (ref 11.5–15.5)
WBC: 8.6 10*3/uL (ref 4.0–10.5)
nRBC: 0 % (ref 0.0–0.2)

## 2021-10-28 LAB — COMPREHENSIVE METABOLIC PANEL
ALT: 23 U/L (ref 0–44)
AST: 17 U/L (ref 15–41)
Albumin: 3.6 g/dL (ref 3.5–5.0)
Alkaline Phosphatase: 89 U/L (ref 38–126)
Anion gap: 8 (ref 5–15)
BUN: 10 mg/dL (ref 8–23)
CO2: 30 mmol/L (ref 22–32)
Calcium: 9.1 mg/dL (ref 8.9–10.3)
Chloride: 103 mmol/L (ref 98–111)
Creatinine, Ser: 1.13 mg/dL — ABNORMAL HIGH (ref 0.44–1.00)
GFR, Estimated: 53 mL/min — ABNORMAL LOW (ref >60)
Glucose, Bld: 104 mg/dL — ABNORMAL HIGH (ref 70–99)
Potassium: 3.9 mmol/L (ref 3.5–5.1)
Sodium: 141 mmol/L (ref 135–145)
Total Bilirubin: 0.3 mg/dL (ref 0.3–1.2)
Total Protein: 7.2 g/dL (ref 6.5–8.1)

## 2021-10-28 NOTE — Progress Notes (Signed)
[facility] [address] [place] [address]   CLINIC:  Medical Oncology/Hematology  CONSULT NOTE  Patient Care Team: Celene Squibb, MD as PCP - General (Internal Medicine) Derek Jack, MD as Medical Oncologist (Medical Oncology) Brien Mates, RN as Oncology Nurse Navigator (Medical Oncology)  CHIEF COMPLAINTS/PURPOSE OF CONSULTATION:  Evaluation for stage III medullary thyroid cancer  HISTORY OF PRESENTING ILLNESS:  Ms. Kristin Wells 71 y.o. female is here because of evaluation stage III medullary thyroid cancer, at the request of Dr. Dorris Fetch.  Today she reports feeling well, and she is accompanied by her caregiver. She reports she is recovering well since her total thyroidectomy on 5/18 without pain and only mild numbness at the surgical site. She denies prior personal history of cancer. She reports history of DM. She has lost 10 lbs unintentional in the past 3 months. Her appetite is poor, and she reports anxiety and depression. She reports cough and SOB from history of COPD, and she reports CP with coughing. She reports constipation. She reports swelling in her feet. She reports she had a "light heart attack". She denies new pains. She reports easy bruising.   She currently lives at home on her own, and her caregiver lives in the same apartment building and stays with her during the day Monday-Friday. She is able to do most of her daily activities such as cooking on her own. 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 She currently smokes 1 ppd, and she started smoking at 71 years old. Her maternal grandfather had possible testicular cancer, and her sister has thyroid cancer.    MEDICAL HISTORY:  Past Medical History:  Diagnosis Date   Acute respiratory failure ([place]) 06/2011   Vent dependent sec to COPD/PNA   Anemia    Anxiety    Anxiety disorder, unspecified    Arthritis    Arthritis    Atherosclerotic heart disease of native coronary  artery with unspecified angina pectoris (HCC)    Back pain    Bronchitis    CHF (congestive heart failure) (HCC)    Chronic obstructive pulmonary disease with (acute) exacerbation (HCC)    Chronic obstructive pulmonary disease, unspecified (HCC)    Chronic obstructive pulmonary disease, unspecified (HCC)    COPD (chronic obstructive pulmonary disease) (HCC)    Coronary artery disease    Demand ischemia of myocardium 06/28/2011   Depression    Diabetes mellitus without complication (HCC)    Dizziness and giddiness    Dyspnea    Edema, unspecified    GERD (gastroesophageal reflux disease)    Hyperlipidemia    Hyperlipidemia, unspecified    Hypertension    Hypertensive heart disease without heart failure    Hypothyroidism, unspecified    Insomnia, unspecified    Ischemic cardiomyopathy 06/28/2011   EF 25%. Myoview stress test later showed ejection fraction of 65%   Lumbago    Major depressive disorder, single episode, unspecified    MI, acute, non ST segment elevation (HCC) 06/29/2011   Myoview stress test revealed mild perfusion defect   Myocardial infarction [facility])    Neuralgia and neuritis, unspecified    Nicotine dependence, unspecified, uncomplicated    Obstructive chronic bronchitis with acute bronchitis ([place])    Other acute osteomyelitis, unspecified ankle and foot (HCC)    Overactive bladder    Oxygen deficiency    PNA (pneumonia) 06/29/2011   Pneumonia    Pneumonia    Primary thyroid malignancy ([place]) 03/26/2021  Rectocele 10/09/2012   Substance abuse ([place])    MANY YEARS AGO   Symptomatic menopausal or female climacteric states    Thyroid enlargement 06/28/2011   Type 2 diabetes mellitus with diabetic peripheral angiopathy without gangrene (HCC)    Type 2 diabetes mellitus with hyperglycemia ([place])    Unspecified convulsions ([place])     SURGICAL HISTORY: Past Surgical History:  Procedure Laterality Date   COLONOSCOPY  Feb 2013   Dr. Michail Sermon: internal  hemorrhoids, normal view of ileum, multiple hyperplastic polyps   ESOPHAGOGASTRODUODENOSCOPY  Feb 2013   Dr. Michail Sermon: normal duodenum, candida esophagitis, reactive gastropathy   THYROIDECTOMY N/A 09/23/2021   Procedure: TOTAL THYROIDECTOMY WITH LIMITED LYMPH NODE DISSECTION;  Surgeon: Armandina Gemma, MD;  Location: [facility];  Service: General;  Laterality: N/A;   TUBAL LIGATION      SOCIAL HISTORY: Social History   Socioeconomic History   Marital status: Single    Spouse name: Not on file   Number of children: Not on file   Years of education: Not on file   Highest education level: Not on file  Occupational History   Not on file  Tobacco Use   Smoking status: Every Day    Packs/day: 0.50    Years: 49.00    Total pack years: 24.50    Types: Cigarettes    Start date: 05/10/1967   Smokeless tobacco: Never   Tobacco comments:    START 1969  Vaping Use   Vaping Use: Never used  Substance and Sexual Activity   Alcohol use: No    Alcohol/week: 0.0 standard drinks of alcohol   Drug use: No   Sexual activity: Never    Birth control/protection: None, Post-menopausal  Other Topics Concern   Not on file  Social History Narrative   Not on file   Social Determinants of Health   Financial Resource Strain: Not on file  Food Insecurity: Not on file  Transportation Needs: Not on file  Physical Activity: Not on file  Stress: Not on file  Social Connections: Not on file  Intimate Partner Violence: Not on file    FAMILY HISTORY: Family History  Problem Relation Age of Onset   Diabetes Mother    Hypertension Mother    Diabetes Father    Hypertension Father    Heart disease Father        HEART FAILURE   Skin cancer Brother        Un[place]wn type   Alcohol abuse Brother    Seizures Sister    Uterine cancer Sister    Early death Sister        Pneumonia at 43 months old   Early death Son    Drug abuse Son    Heart attack Son    Cancer Maternal Uncle    Cirrhosis Brother     Colon cancer Neg Hx     ALLERGIES:  is allergic to cat hair extract and other.  MEDICATIONS:  Current Outpatient Medications  Medication Sig Dispense Refill   albuterol (VENTOLIN HFA) 108 (90 Base) MCG/ACT inhaler Inhale into the lungs.     aspirin EC 81 MG tablet Take 81 mg by mouth daily.     blood glucose meter kit and supplies KIT Dispense based on patient and insurance preference. Use up to four times daily as directed. (FOR ICD-9 250.00, 250.01). 1 each 0   BREZTRI AEROSPHERE 160-9-4.8 MCG/ACT AERO Inhale into the lungs.     calcium carbonate (TUMS) 500 MG chewable  tablet Chew 2 tablets (400 mg of elemental calcium total) by mouth 2 (two) times daily. 90 tablet 1   cetirizine (ZYRTEC) 10 MG tablet Take 10 mg by mouth daily.     cyclobenzaprine (FLEXERIL) 10 MG tablet Take 10 mg by mouth 2 (two) times daily.     DULoxetine (CYMBALTA) 60 MG capsule Take 60 mg by mouth daily.     ergocalciferol (VITAMIN D2) 1.25 MG (50000 UT) capsule Take 50,000 Units by mouth once a week. Thursday     furosemide (LASIX) 20 MG tablet Take 1 tablet (20 mg total) by mouth daily. 30 tablet 6   GLUCERNA (GLUCERNA) LIQD Take 237 mLs by mouth 3 (three) times daily between meals. 90 Can 0   guaiFENesin (MUCINEX) 600 MG 12 hr tablet Take 1 tablet (600 mg total) by mouth 2 (two) times daily. 30 tablet 0   Incontinence Supply Disposable (POISE PANTILINERS) PADS 1 each by Does not apply route as needed. 150 each 0   levothyroxine (SYNTHROID) 75 MCG tablet Take 75 mcg by mouth daily.     metFORMIN (GLUCOPHAGE) 500 MG tablet Take 500 mg by mouth 2 (two) times daily with a meal.      naproxen sodium (ALEVE) 220 MG tablet Take 220 mg by mouth daily as needed (pain).     pantoprazole (PROTONIX) 40 MG tablet Take 40 mg by mouth daily.     potassium chloride (K-DUR) 10 MEQ tablet Take 1 tablet (10 mEq total) by mouth daily. 90 tablet 3   rosuvastatin (CRESTOR) 20 MG tablet Take 1 tablet (20 mg total) by mouth daily. 90  tablet 3   traMADol (ULTRAM) 50 MG tablet Take 1-2 tablets (50-100 mg total) by mouth every 6 (six) hours as needed for moderate pain. 15 tablet 0   No current facility-administered medications for this visit.    REVIEW OF SYSTEMS:   Review of Systems  Constitutional:  Positive for appetite change and unexpected weight change (-10 lbs). Negative for fatigue.  HENT:   Positive for trouble swallowing.   Respiratory:  Positive for cough and shortness of breath.   Cardiovascular:  Positive for chest pain (3/10) and leg swelling (feet).  Gastrointestinal:  Positive for constipation.  Neurological:  Positive for numbness (@ thyroidectomy site).  Hematological:  Bruises/bleeds easily.  Psychiatric/Behavioral:  Positive for depression and sleep disturbance. The patient is nervous/anxious.   All other systems reviewed and are negative.    PHYSICAL EXAMINATION: ECOG PERFORMANCE STATUS: 1 - Symptomatic but completely ambulatory  Vitals:   10/28/21 1310  BP: (!) 141/85  Pulse: (!) 116  Resp: 18  Temp: 98.1 F (36.7 C)  SpO2: 98%   Filed Weights   10/28/21 1310  Weight: 125 lb 8 oz (56.9 kg)   Physical Exam Vitals reviewed.  Constitutional:      Appearance: Normal appearance.  Neck:     Comments: Thyroidectomy incision well healed Cardiovascular:     Rate and Rhythm: Normal rate and regular rhythm.     Pulses: Normal pulses.     Heart sounds: Normal heart sounds.  Pulmonary:     Effort: Pulmonary effort is normal.     Breath sounds: Normal breath sounds.  Abdominal:     Palpations: Abdomen is soft. There is no hepatomegaly, splenomegaly or mass.     Tenderness: There is no abdominal tenderness.  Musculoskeletal:     Right lower leg: No edema.     Left lower leg: No edema.  Lymphadenopathy:  Cervical: Cervical adenopathy present.     Right cervical: No superficial, deep or posterior cervical adenopathy.    Left cervical: No superficial, deep or posterior cervical  adenopathy.     Lower Body: No right inguinal adenopathy. No left inguinal adenopathy.  Neurological:     General: No focal deficit present.     Mental Status: She is alert and oriented to person, place, and time.  Psychiatric:        Mood and Affect: Mood normal.        Behavior: Behavior normal.      LABORATORY DATA:  I have reviewed the data as listed    Latest Ref Rng & Units 08/22/2017    2:15 PM 01/31/2017    4:57 AM 01/30/2017    2:02 PM  CBC  WBC 3.8 - 10.8 Thousand/uL 9.4  7.2  8.8   Hemoglobin 11.7 - 15.5 g/dL 14.1  14.1  15.9   Hematocrit 35.0 - 45.0 % 42.2  42.7  47.7   Platelets 140 - 400 Thousand/uL 228  254  311       Latest Ref Rng & Units 09/24/2021    4:43 AM 08/19/2020   12:00 AM 08/22/2017    2:15 PM  CMP  Glucose 70 - 99 mg/dL 128   91   BUN 8 - 23 mg/dL _0 Creatinine 0.44 - 1.00 mg/dL 1.49  1.1     0.97   Sodium 135 - 145 mmol/L 137   139   Potassium 3.5 - 5.1 mmol/L 4.8   3.8   Chloride 98 - 111 mmol/L 101   99   CO2 22 - 32 mmol/L 29   34   Calcium 8.9 - 10.3 mg/dL 9.0  9.2     8.8   Total Protein 6.1 - 8.1 g/dL   6.4   Total Bilirubin 0.2 - 1.2 mg/dL   0.4   AST 10 - 35 U/L   9   ALT 6 - 29 U/L   11      This result is from an external source.    RADIOGRAPHIC STUDIES: I have personally reviewed the radiological images as listed and agreed with the findings in the report. No results found.  ASSESSMENT:  Stage III (T3N1A) medullary thyroid cancer: - US thyroid (02/01/2021): Solitary right superior thyroid nodule, 3.9 cm. - FNA (03/17/2021) malignant cells present. - Total thyroidectomy and central compartment lymph node dissection (09/23/2021) by Dr. Harlow Asa. - Pathology: Medullary thyroid carcinoma with tumor confined to the right lobe measuring 4.5 x 3.4 x 3.5 cm.  Margins are free.  5/8 lymph nodes with metastatic medullary carcinoma.  PT3APN1A.  Angioinvasion/lymphatic invasion/extrathyroidal extension: Not identified.  Margins are  negative. - Reports 10 pound weight loss in the last 3 months due to decreased appetite.   Social/family history: - She lives by herself at home.  She has an aide during the daytime 5 days a week.  She is able to do all her ADLs and IADLs.  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  She is a current active smoker, 1 pack/day for the last 53 years. - Maternal grandfather had?  Testicular cancer.  Sister had thyroid cancer.   PLAN:  Stage III (T3N1A) medullary thyroid cancer: - We discussed the pathology report in detail. - Recommend checking routine labs along with CEA and calcitonin. - We will also check plasma metanephrines  for coexisting pheochromocytoma. - Recommended germline mutation testing for RET exon 10, 11, 13-16. - Recommend CT with contrast of the neck, chest.  Patient reports that she has severe claustrophobia and cannot do MRI of the abdomen.  Would recommend triple phase CT scan of the abdomen and pelvis. - RTC after above scans.  If the calcitonin is very high, will also consider doing gallium-68 dotatate PET scan.  2.  Thyroxine therapy: - Continue levothyroxine 75 mcg daily.   All questions were answered. The patient knows to call the clinic with any problems, questions or concerns.   Derek Jack, MD, 10/28/21 2:01 PM  [place] [phone]   I, Thana Ates, am acting as a scribe for Dr. Derek Jack.  I, Derek Jack MD, have reviewed the above documentation for accuracy and completeness, and I agree with the above.

## 2021-10-28 NOTE — Patient Instructions (Addendum)
[place] at [facility] Discharge Instructions  You were seen and examined today by Dr. Delton Coombes. Dr. Delton Coombes is a medical oncologist, meaning that he specializes in the treatment of cancer diagnoses. Dr. Delton Coombes discussed your past medical history, family history of cancers, and the events that led to you being here today.  You were referred to Dr. Delton Coombes due to a new diagnosis of medullary thyroid cancer. This is a fairly uncommon type of thyroid cancer. Unfortunately, it has spread to the lymph nodes. This means that it is at least Stage III cancer, this needs to be confirmed. You have never had a scan to ensure there is no spread elsewhere.  Dr. Delton Coombes has recommended additional labs today as well as additional scans, including CT scans.  Please follow-up as scheduled.  Thank you for choosing [place] at [facility] to provide your oncology and hematology care.  To afford each patient quality time with our provider, please arrive at least 15 minutes before your scheduled appointment time.   If you have a lab appointment with the [facility] please come in thru the Main Entrance and check in at the main information desk.  You need to re-schedule your appointment should you arrive 10 or more minutes late.  We strive to give you quality time with our providers, and arriving late affects you and other patients whose appointments are after yours.  Also, if you no show three or more times for appointments you may be dismissed from the clinic at the providers discretion.     Again, thank you for choosing [facility].  Our hope is that these requests will decrease the amount of time that you wait before being seen by our physicians.       _____________________________________________________________  Should you have questions after your visit to [facility], please contact our office at [phone] and  follow the prompts.  Our office hours are 8:00 a.m. and 4:30 p.m. Monday - Friday.  Please note that voicemails left after 4:00 p.m. may not be returned until the following business day.  We are closed weekends and major holidays.  You do have access to a nurse 24-7, just call the main number to the clinic [phone] and do not press any options, hold on the line and a nurse will answer the phone.    For prescription refill requests, have your pharmacy contact our office and allow 72 hours.

## 2021-10-29 DIAGNOSIS — Z809 Family history of malignant neoplasm, unspecified: Secondary | ICD-10-CM | POA: Diagnosis not present

## 2021-10-29 DIAGNOSIS — Z79899 Other long term (current) drug therapy: Secondary | ICD-10-CM | POA: Diagnosis not present

## 2021-10-29 DIAGNOSIS — F17210 Nicotine dependence, cigarettes, uncomplicated: Secondary | ICD-10-CM | POA: Diagnosis not present

## 2021-10-29 DIAGNOSIS — R634 Abnormal weight loss: Secondary | ICD-10-CM | POA: Diagnosis not present

## 2021-10-29 DIAGNOSIS — C73 Malignant neoplasm of thyroid gland: Secondary | ICD-10-CM | POA: Diagnosis not present

## 2021-10-29 LAB — CALCITONIN: Calcitonin: 132 pg/mL — ABNORMAL HIGH (ref 0.0–5.0)

## 2021-10-30 LAB — CEA: CEA: 67.1 ng/mL — ABNORMAL HIGH (ref 0.0–4.7)

## 2021-11-03 LAB — METANEPHRINES, PLASMA
Metanephrine, Free: 39.7 pg/mL (ref 0.0–88.0)
Normetanephrine, Free: 277.9 pg/mL (ref 0.0–285.2)

## 2021-11-15 ENCOUNTER — Other Ambulatory Visit (HOSPITAL_COMMUNITY): Payer: Medicare Other

## 2021-11-16 ENCOUNTER — Ambulatory Visit (HOSPITAL_COMMUNITY): Payer: Medicare Other

## 2021-11-16 ENCOUNTER — Ambulatory Visit (HOSPITAL_COMMUNITY)
Admission: RE | Admit: 2021-11-16 | Discharge: 2021-11-16 | Disposition: A | Discharge status: Home / Self Care | Payer: Medicare Other | Source: Ambulatory Visit | Attending: Hematology | Admitting: Hematology

## 2021-11-16 DIAGNOSIS — F17210 Nicotine dependence, cigarettes, uncomplicated: Secondary | ICD-10-CM | POA: Insufficient documentation

## 2021-11-16 DIAGNOSIS — N2889 Other specified disorders of kidney and ureter: Secondary | ICD-10-CM | POA: Diagnosis not present

## 2021-11-16 DIAGNOSIS — C73 Malignant neoplasm of thyroid gland: Secondary | ICD-10-CM | POA: Diagnosis not present

## 2021-11-16 DIAGNOSIS — R590 Localized enlarged lymph nodes: Secondary | ICD-10-CM | POA: Diagnosis not present

## 2021-11-16 DIAGNOSIS — I700 Atherosclerosis of aorta: Secondary | ICD-10-CM | POA: Diagnosis not present

## 2021-11-16 DIAGNOSIS — E042 Nontoxic multinodular goiter: Secondary | ICD-10-CM | POA: Insufficient documentation

## 2021-11-16 DIAGNOSIS — J449 Chronic obstructive pulmonary disease, unspecified: Secondary | ICD-10-CM | POA: Diagnosis not present

## 2021-11-16 DIAGNOSIS — E890 Postprocedural hypothyroidism: Secondary | ICD-10-CM | POA: Insufficient documentation

## 2021-11-16 DIAGNOSIS — C760 Malignant neoplasm of head, face and neck: Secondary | ICD-10-CM | POA: Diagnosis not present

## 2021-11-16 MED ORDER — IOHEXOL 300 MG/ML  SOLN
100.0000 mL | Freq: Once | INTRAMUSCULAR | Status: AC | PRN
  Administered 2021-11-16: 100 mL via INTRAVENOUS

## 2021-11-16 MED ORDER — SODIUM CHLORIDE (PF) 0.9 % IJ SOLN
INTRAMUSCULAR | Status: AC
  Filled 2021-11-16: qty 50

## 2021-11-18 ENCOUNTER — Other Ambulatory Visit (HOSPITAL_COMMUNITY): Payer: Self-pay

## 2021-11-18 ENCOUNTER — Encounter (HOSPITAL_COMMUNITY): Payer: Self-pay | Admitting: Licensed Clinical Social Worker

## 2021-11-18 ENCOUNTER — Other Ambulatory Visit (HOSPITAL_COMMUNITY): Payer: Medicare Other

## 2021-11-18 ENCOUNTER — Inpatient Hospital Stay (HOSPITAL_COMMUNITY): Payer: Medicare Other | Attending: Hematology | Admitting: Licensed Clinical Social Worker

## 2021-11-18 DIAGNOSIS — Z808 Family history of malignant neoplasm of other organs or systems: Secondary | ICD-10-CM | POA: Diagnosis not present

## 2021-11-18 DIAGNOSIS — Z79899 Other long term (current) drug therapy: Secondary | ICD-10-CM | POA: Insufficient documentation

## 2021-11-18 DIAGNOSIS — C73 Malignant neoplasm of thyroid gland: Secondary | ICD-10-CM | POA: Insufficient documentation

## 2021-11-18 DIAGNOSIS — F17210 Nicotine dependence, cigarettes, uncomplicated: Secondary | ICD-10-CM | POA: Insufficient documentation

## 2021-11-18 LAB — GENETIC SCREENING ORDER

## 2021-11-18 NOTE — Progress Notes (Signed)
REFERRING PROVIDER: Derek Jack, MD [address] [place],  [place] [address]  PRIMARY PROVIDER:  Celene Squibb, MD  PRIMARY REASON FOR VISIT:  1. Medullary thyroid carcinoma ([place])   2. Family history of thyroid cancer      HISTORY OF PRESENT ILLNESS:   Kristin Wells, a 71 y.o. female, was seen for a [place] cancer genetics consultation at the request of Dr. Delton Coombes due to a personal history of medullary thyroid cancer.  Kristin Wells presents to clinic today to discuss the possibility of a hereditary predisposition to cancer, genetic testing, and to further clarify her future cancer risks, as well as potential cancer risks for family members.   CANCER HISTORY:  In 2023, at the age of 14, Kristin Wells was diagnosed with medullary thyroid cancer. The treatment plan includes total thyroidectomy (completed 09/23/2021).  RISK FACTORS:  Menarche was at age 32.  First live birth at age 56.  Ovaries intact: yes.  Hysterectomy: no.  Menopausal status: postmenopausal.  Colonoscopy: yes;  reports 8 polyps on first colonoscopy . Mammogram within the last year: yes. Number of breast biopsies: 0.  Past Medical History:  Diagnosis Date   Acute respiratory failure ([place]) 06/2011   Vent dependent sec to COPD/PNA   Anemia    Anxiety    Anxiety disorder, unspecified    Arthritis    Arthritis    Atherosclerotic heart disease of native coronary artery with unspecified angina pectoris (HCC)    Back pain    Bronchitis    CHF (congestive heart failure) (HCC)    Chronic obstructive pulmonary disease with (acute) exacerbation (HCC)    Chronic obstructive pulmonary disease, unspecified (HCC)    Chronic obstructive pulmonary disease, unspecified (HCC)    COPD (chronic obstructive pulmonary disease) (HCC)    Coronary artery disease    Demand ischemia of myocardium 06/28/2011   Depression    Diabetes mellitus without complication (HCC)    Dizziness and giddiness    Dyspnea    Edema, unspecified     GERD (gastroesophageal reflux disease)    Hyperlipidemia    Hyperlipidemia, unspecified    Hypertension    Hypertensive heart disease without heart failure    Hypothyroidism, unspecified    Insomnia, unspecified    Ischemic cardiomyopathy 06/28/2011   EF 25%. Myoview stress test later showed ejection fraction of 65%   Lumbago    Major depressive disorder, single episode, unspecified    MI, acute, non ST segment elevation (HCC) 06/29/2011   Myoview stress test revealed mild perfusion defect   Myocardial infarction [facility])    Neuralgia and neuritis, unspecified    Nicotine dependence, unspecified, uncomplicated    Obstructive chronic bronchitis with acute bronchitis ([place])    Other acute osteomyelitis, unspecified ankle and foot (HCC)    Overactive bladder    Oxygen deficiency    PNA (pneumonia) 06/29/2011   Pneumonia    Pneumonia    Primary thyroid malignancy ([place]) 03/26/2021   Rectocele 10/09/2012   Substance abuse ([place])    MANY YEARS AGO   Symptomatic menopausal or female climacteric states    Thyroid enlargement 06/28/2011   Type 2 diabetes mellitus with diabetic peripheral angiopathy without gangrene ([place])    Type 2 diabetes mellitus with hyperglycemia ([place])    Unspecified convulsions ([place])     Past Surgical History:  Procedure Laterality Date   COLONOSCOPY  Feb 2013   Dr. Michail Sermon: internal hemorrhoids, normal view of ileum, multiple hyperplastic polyps   ESOPHAGOGASTRODUODENOSCOPY  Feb 2013   Dr. Michail Sermon: normal duodenum, candida esophagitis, reactive gastropathy   THYROIDECTOMY N/A 09/23/2021   Procedure: TOTAL THYROIDECTOMY WITH LIMITED LYMPH NODE DISSECTION;  Surgeon: Armandina Gemma, MD;  Location: [facility]S;  Service: General;  Laterality: N/A;   TUBAL LIGATION         FAMILY HISTORY:  We obtained a detailed, 4-generation family history.  Significant diagnoses are listed below: Family History  Problem Relation Age of Onset   Diabetes Mother    Hypertension  Mother    Diabetes Father    Hypertension Father    Heart disease Father        HEART FAILURE   Skin cancer Brother        Unkonwn type   Alcohol abuse Brother    Seizures Sister    Uterine cancer Sister    Early death Sister        Pneumonia at 24 months old   Early death Son    Drug abuse Son    Heart attack Son    Cancer Maternal Uncle    Cirrhosis Brother    Colon cancer Neg Hx    Kristin Wells has 6 sons and 1 daughter, one of her sons passed at 76. Patient had 3 brothers and 1 sister. Her sister had thyroid cancer in her 16s.   Kristin Wells mother died over 54. Maternal grandfather had testicular cancer.   Kristin Wells father died over 20. No information about his side of the family.   Kristin Wells is unaware of previous family history of genetic testing for hereditary cancer risks. There is no reported Ashkenazi Jewish ancestry. There is no known consanguinity.    GENETIC COUNSELING ASSESSMENT: Kristin Wells is a 71 y.o. female with a personal history of medullary thyroid cancer which is somewhat suggestive of a hereditary cancer syndrome and predisposition to cancer. We, therefore, discussed and recommended the following at today's visit.   DISCUSSION: We discussed that approximately 10% of cancer in general is hereditary. Medullary Thyroid Cancer (MTC) is a relatively rare thyroid cancer, making up approximately 10% of all thyroid cancer.  Approximately 25% of MTC are the result of either multiple endocrine neoplasia type 2A (MEN2A), MEN2B or Familial Medullary Thyroid Cancer (FMTC).  All of these conditions are caused by genetic changes in the RET gene.  We discussed that testing is beneficial for several reasons including knowing about cancer risks, identifying potential screening and risk-reduction options that may be appropriate, and to understand if other family members could be at risk for cancer and allow them to undergo genetic testing.   We reviewed the characteristics, features  and inheritance patterns of hereditary cancer syndromes. We also discussed genetic testing, including the appropriate family members to test, the process of testing, insurance coverage and turn-around-time for results. We discussed the implications of a negative, positive and/or variant of uncertain significant result. We recommended Kristin Wells pursue genetic testing for the [facility] Common Hereditary Cancers+RNA gene panel.   Based on Kristin Wells's personal history of cancer, she meets medical criteria for genetic testing. Despite that she meets criteria, she may still have an out of pocket cost. We discussed that if her out of pocket cost for testing is over $100, the laboratory will call and confirm whether she wants to proceed with testing.  If the out of pocket cost of testing is less than $100 she will be billed by the genetic testing laboratory.   PLAN: After considering the risks, benefits, and  limitations, Kristin Wells provided informed consent to pursue genetic testing and the blood sample was sent to [facility] for analysis of the Common Hereditary Cancers+RNA panel. Results should be available within approximately 2-3 weeks' time, at which point they will be disclosed by telephone to Kristin Wells, as will any additional recommendations warranted by these results. Kristin Wells will receive a summary of her genetic counseling visit and a copy of her results once available. This information will also be available in Epic.   Kristin Wells questions were answered to her satisfaction today. Our contact information was provided should additional questions or concerns arise. Thank you for the referral and allowing [place] to share in the care of your patient.   Faith Rogue, MS, [facility] Genetic Counselor [email or web address] Phone: [phone]  The patient was seen for a total of 25 minutes in face-to-face genetic counseling.  Patient was seen with her caregiver, Leanne. Dr. Grayland Ormond was available  for discussion regarding this case.   _______________________________________________________________________ For Office Staff:  Number of people involved in session: 2 Was an Intern/ student involved with case: no

## 2021-11-20 DIAGNOSIS — E782 Mixed hyperlipidemia: Secondary | ICD-10-CM | POA: Diagnosis not present

## 2021-11-20 DIAGNOSIS — J449 Chronic obstructive pulmonary disease, unspecified: Secondary | ICD-10-CM | POA: Diagnosis not present

## 2021-11-20 DIAGNOSIS — E039 Hypothyroidism, unspecified: Secondary | ICD-10-CM | POA: Diagnosis not present

## 2021-11-20 DIAGNOSIS — E119 Type 2 diabetes mellitus without complications: Secondary | ICD-10-CM | POA: Diagnosis not present

## 2021-11-20 DIAGNOSIS — G894 Chronic pain syndrome: Secondary | ICD-10-CM | POA: Diagnosis not present

## 2021-11-22 ENCOUNTER — Other Ambulatory Visit (HOSPITAL_COMMUNITY)
Admission: RE | Admit: 2021-11-22 | Discharge: 2021-11-22 | Disposition: A | Discharge status: Home / Self Care | Payer: Medicare Other | Source: Ambulatory Visit | Attending: "Endocrinology | Admitting: "Endocrinology

## 2021-11-22 ENCOUNTER — Ambulatory Visit: Payer: Medicare Other | Admitting: "Endocrinology

## 2021-11-22 DIAGNOSIS — C73 Malignant neoplasm of thyroid gland: Secondary | ICD-10-CM | POA: Insufficient documentation

## 2021-11-22 LAB — T4, FREE: Free T4: 0.82 ng/dL (ref 0.61–1.12)

## 2021-11-22 LAB — TSH: TSH: 44.937 u[IU]/mL — ABNORMAL HIGH (ref 0.350–4.500)

## 2021-11-23 ENCOUNTER — Inpatient Hospital Stay (HOSPITAL_BASED_OUTPATIENT_CLINIC_OR_DEPARTMENT_OTHER): Payer: Medicare Other | Admitting: Hematology

## 2021-11-23 VITALS — BP 132/80 | HR 113 | Temp 98.7°F | Resp 19 | Ht 60.0 in | Wt 124.5 lb

## 2021-11-23 DIAGNOSIS — C73 Malignant neoplasm of thyroid gland: Secondary | ICD-10-CM | POA: Diagnosis not present

## 2021-11-23 DIAGNOSIS — Z79899 Other long term (current) drug therapy: Secondary | ICD-10-CM | POA: Diagnosis not present

## 2021-11-23 DIAGNOSIS — F17210 Nicotine dependence, cigarettes, uncomplicated: Secondary | ICD-10-CM | POA: Diagnosis not present

## 2021-11-23 NOTE — Patient Instructions (Addendum)
[place] at [facility] Discharge Instructions  You were seen and examined today by Dr. Delton Coombes.  Dr. Delton Coombes discussed your most recent lab work and CT scan which revealed that your tumor markers in your blood are elevated. Your CT scan shows that there is some lymph nodes and a spot on your sternum that are concerning.   Dr. Delton Coombes will schedule you for a PET scan and a biopsy of your sternum to see if the cancer has spread.   Follow-up as scheduled.    Thank you for choosing [place] at [facility] to provide your oncology and hematology care.  To afford each patient quality time with our provider, please arrive at least 15 minutes before your scheduled appointment time.   If you have a lab appointment with the [facility] please come in thru the Main Entrance and check in at the main information desk.  You need to re-schedule your appointment should you arrive 10 or more minutes late.  We strive to give you quality time with our providers, and arriving late affects you and other patients whose appointments are after yours.  Also, if you no show three or more times for appointments you may be dismissed from the clinic at the providers discretion.     Again, thank you for choosing [facility].  Our hope is that these requests will decrease the amount of time that you wait before being seen by our physicians.       _____________________________________________________________  Should you have questions after your visit to [facility], please contact our office at [phone] and follow the prompts.  Our office hours are 8:00 a.m. and 4:30 p.m. Monday - Friday.  Please note that voicemails left after 4:00 p.m. may not be returned until the following business day.  We are closed weekends and major holidays.  You do have access to a nurse 24-7, just call the main number to the clinic [phone] and do not  press any options, hold on the line and a nurse will answer the phone.    For prescription refill requests, have your pharmacy contact our office and allow 72 hours.    Due to Covid, you will need to wear a mask upon entering the hospital. If you do not have a mask, a mask will be given to you at the Main Entrance upon arrival. For doctor visits, patients may have 1 support person age 23 or older with them. For treatment visits, patients can not have anyone with them due to social distancing guidelines and our immunocompromised population.

## 2021-11-23 NOTE — Progress Notes (Signed)
[place], [place] [address]   CLINIC:  Medical Oncology/Hematology  PCP:  Celene Squibb, MD [address] [place] [address] [phone]   REASON FOR VISIT:  Follow-up for stage III medullary thyroid cancer  PRIOR THERAPY: none  NGS Results: not done  CURRENT THERAPY: under work-up  BRIEF ONCOLOGIC HISTORY:  Oncology History   No history exists.    CANCER STAGING: Cancer Staging  Medullary thyroid carcinoma [facility]) Staging form: Thyroid - Medullary, AJCC 8th Edition - Clinical stage from 10/27/2021: Stage III (cT3a, cN1a, cM0) - Unsigned   INTERVAL HISTORY:  Ms. Kristin Wells, a 71 y.o. female, returns for routine follow-up of her stage III medullary thyroid cancer. Suzanna was last seen on 10/28/2021.   Today she reports feeling well. She reports chest pain when coughing since May. She denies injury to her sternum. She reports limited range of motion in her shoulders.   REVIEW OF SYSTEMS:  Review of Systems  Constitutional:  Negative for appetite change and fatigue.  Respiratory:  Positive for cough and shortness of breath (COPD).   Cardiovascular:  Positive for chest pain (while coughing).  Gastrointestinal:  Positive for constipation.  Neurological:  Positive for dizziness and headaches.  All other systems reviewed and are negative.   PAST MEDICAL/SURGICAL HISTORY:  Past Medical History:  Diagnosis Date   Acute respiratory failure ([place]) 06/2011   Vent dependent sec to COPD/PNA   Anemia    Anxiety    Anxiety disorder, unspecified    Arthritis    Arthritis    Atherosclerotic heart disease of native coronary artery with unspecified angina pectoris (HCC)    Back pain    Bronchitis    CHF (congestive heart failure) (HCC)    Chronic obstructive pulmonary disease with (acute) exacerbation (HCC)    Chronic obstructive pulmonary disease, unspecified (HCC)    Chronic obstructive pulmonary disease, unspecified  (HCC)    COPD (chronic obstructive pulmonary disease) (HCC)    Coronary artery disease    Demand ischemia of myocardium 06/28/2011   Depression    Diabetes mellitus without complication (HCC)    Dizziness and giddiness    Dyspnea    Edema, unspecified    GERD (gastroesophageal reflux disease)    Hyperlipidemia    Hyperlipidemia, unspecified    Hypertension    Hypertensive heart disease without heart failure    Hypothyroidism, unspecified    Insomnia, unspecified    Ischemic cardiomyopathy 06/28/2011   EF 25%. Myoview stress test later showed ejection fraction of 65%   Lumbago    Major depressive disorder, single episode, unspecified    MI, acute, non ST segment elevation (HCC) 06/29/2011   Myoview stress test revealed mild perfusion defect   Myocardial infarction [facility])    Neuralgia and neuritis, unspecified    Nicotine dependence, unspecified, uncomplicated    Obstructive chronic bronchitis with acute bronchitis ([place])    Other acute osteomyelitis, unspecified ankle and foot (HCC)    Overactive bladder    Oxygen deficiency    PNA (pneumonia) 06/29/2011   Pneumonia    Pneumonia    Primary thyroid malignancy ([place]) 03/26/2021   Rectocele 10/09/2012   Substance abuse ([place])    MANY YEARS AGO   Symptomatic menopausal or female climacteric states    Thyroid enlargement 06/28/2011   Type 2 diabetes mellitus with diabetic peripheral angiopathy without gangrene ([place])    Type 2 diabetes mellitus with hyperglycemia ([place])  Unspecified convulsions ([place])    Past Surgical History:  Procedure Laterality Date   COLONOSCOPY  Feb 2013   Dr. Michail Sermon: internal hemorrhoids, normal view of ileum, multiple hyperplastic polyps   ESOPHAGOGASTRODUODENOSCOPY  Feb 2013   Dr. Michail Sermon: normal duodenum, candida esophagitis, reactive gastropathy   THYROIDECTOMY N/A 09/23/2021   Procedure: TOTAL THYROIDECTOMY WITH LIMITED LYMPH NODE DISSECTION;  Surgeon: Armandina Gemma, MD;  Location: [facility];  Service:  General;  Laterality: N/A;   TUBAL LIGATION      SOCIAL HISTORY:  Social History   Socioeconomic History   Marital status: Single    Spouse name: Not on file   Number of children: Not on file   Years of education: Not on file   Highest education level: Not on file  Occupational History   Not on file  Tobacco Use   Smoking status: Every Day    Packs/day: 0.50    Years: 49.00    Total pack years: 24.50    Types: Cigarettes    Start date: 05/10/1967   Smokeless tobacco: Never   Tobacco comments:    START 1969  Vaping Use   Vaping Use: Never used  Substance and Sexual Activity   Alcohol use: No    Alcohol/week: 0.0 standard drinks of alcohol   Drug use: No   Sexual activity: Never    Birth control/protection: None, Post-menopausal  Other Topics Concern   Not on file  Social History Narrative   Not on file   Social Determinants of Health   Financial Resource Strain: Not on file  Food Insecurity: Not on file  Transportation Needs: Not on file  Physical Activity: Not on file  Stress: Not on file  Social Connections: Not on file  Intimate Partner Violence: Not on file    FAMILY HISTORY:  Family History  Problem Relation Age of Onset   Diabetes Mother    Hypertension Mother    Diabetes Father    Hypertension Father    Heart disease Father        HEART FAILURE   Seizures Sister    Uterine cancer Sister    Early death Sister        Pneumonia at 39 months old   Thyroid cancer Sister    Skin cancer Brother        [place] type   Alcohol abuse Brother    Cirrhosis Brother    Cancer Maternal Uncle    Early death Son    Drug abuse Son    Heart attack Son    Colon cancer Neg Hx     CURRENT MEDICATIONS:  Current Outpatient Medications  Medication Sig Dispense Refill   albuterol (VENTOLIN HFA) 108 (90 Base) MCG/ACT inhaler Inhale into the lungs.     aspirin EC 81 MG tablet Take 81 mg by mouth daily.     blood glucose meter kit and supplies KIT Dispense based  on patient and insurance preference. Use up to four times daily as directed. (FOR ICD-9 250.00, 250.01). 1 each 0   BREZTRI AEROSPHERE 160-9-4.8 MCG/ACT AERO Inhale into the lungs.     calcium carbonate (TUMS) 500 MG chewable tablet Chew 2 tablets (400 mg of elemental calcium total) by mouth 2 (two) times daily. 90 tablet 1   cetirizine (ZYRTEC) 10 MG tablet Take 10 mg by mouth daily.     cyclobenzaprine (FLEXERIL) 10 MG tablet Take 10 mg by mouth 2 (two) times daily.     DULoxetine (CYMBALTA) 60 MG  capsule Take 60 mg by mouth daily.     ergocalciferol (VITAMIN D2) 1.25 MG (50000 UT) capsule Take 50,000 Units by mouth once a week. Thursday     furosemide (LASIX) 20 MG tablet Take 1 tablet (20 mg total) by mouth daily. 30 tablet 6   GLUCERNA (GLUCERNA) LIQD Take 237 mLs by mouth 3 (three) times daily between meals. 90 Can 0   guaiFENesin (MUCINEX) 600 MG 12 hr tablet Take 1 tablet (600 mg total) by mouth 2 (two) times daily. 30 tablet 0   Incontinence Supply Disposable (POISE PANTILINERS) PADS 1 each by Does not apply route as needed. 150 each 0   levothyroxine (SYNTHROID) 75 MCG tablet Take 75 mcg by mouth daily.     metFORMIN (GLUCOPHAGE) 500 MG tablet Take 500 mg by mouth 2 (two) times daily with a meal.      naproxen sodium (ALEVE) 220 MG tablet Take 220 mg by mouth daily as needed (pain).     pantoprazole (PROTONIX) 40 MG tablet Take 40 mg by mouth daily.     potassium chloride (K-DUR) 10 MEQ tablet Take 1 tablet (10 mEq total) by mouth daily. 90 tablet 3   rosuvastatin (CRESTOR) 20 MG tablet Take 1 tablet (20 mg total) by mouth daily. 90 tablet 3   traMADol (ULTRAM) 50 MG tablet Take 1-2 tablets (50-100 mg total) by mouth every 6 (six) hours as needed for moderate pain. 15 tablet 0   No current facility-administered medications for this visit.    ALLERGIES:  Allergies  Allergen Reactions   Cat Hair Extract Cough    sneezing   Other Cough    Coughing and sneezing d/t some cats, dogs  and perfumes    PHYSICAL EXAM:  Performance status (ECOG): 1 - Symptomatic but completely ambulatory  There were no vitals filed for this visit. Wt Readings from Last 3 Encounters:  10/28/21 125 lb 8 oz (56.9 kg)  10/21/21 120 lb (54.4 kg)  09/23/21 123 lb 9.6 oz (56.1 kg)   Physical Exam Vitals reviewed.  Constitutional:      Appearance: Normal appearance.  Cardiovascular:     Rate and Rhythm: Normal rate and regular rhythm.     Pulses: Normal pulses.     Heart sounds: Normal heart sounds.  Pulmonary:     Effort: Pulmonary effort is normal.     Breath sounds: Normal breath sounds.  Neurological:     General: No focal deficit present.     Mental Status: She is alert and oriented to person, place, and time.  Psychiatric:        Mood and Affect: Mood normal.        Behavior: Behavior normal.      LABORATORY DATA:  I have reviewed the labs as listed.     Latest Ref Rng & Units 10/28/2021    2:02 PM 08/22/2017    2:15 PM 01/31/2017    4:57 AM  CBC  WBC 4.0 - 10.5 K/uL 8.6  9.4  7.2   Hemoglobin 12.0 - 15.0 g/dL 14.5  14.1  14.1   Hematocrit 36.0 - 46.0 % 45.8  42.2  42.7   Platelets 150 - 400 K/uL 305  228  254       Latest Ref Rng & Units 10/28/2021    2:02 PM 09/24/2021    4:43 AM 08/19/2020   12:00 AM  CMP  Glucose 70 - 99 mg/dL 104  128    BUN 8 - 23  mg/dL '10  22  10      '$ Creatinine 0.44 - 1.00 mg/dL 1.13  1.49  1.1      Sodium 135 - 145 mmol/L 141  137    Potassium 3.5 - 5.1 mmol/L 3.9  4.8    Chloride 98 - 111 mmol/L 103  101    CO2 22 - 32 mmol/L 30  29    Calcium 8.9 - 10.3 mg/dL 9.1  9.0  9.2      Total Protein 6.5 - 8.1 g/dL 7.2     Total Bilirubin 0.3 - 1.2 mg/dL 0.3     Alkaline Phos 38 - 126 U/L 89     AST 15 - 41 U/L 17     ALT 0 - 44 U/L 23        This result is from an external source.    DIAGNOSTIC IMAGING:  I have independently reviewed the scans and discussed with the patient.    ASSESSMENT:  Stage III (T3N1A) medullary thyroid  cancer: - US thyroid (02/01/2021): Solitary right superior thyroid nodule, 3.9 cm. - FNA (03/17/2021) malignant cells present. - Total thyroidectomy and central compartment lymph node dissection (09/23/2021) by Dr. Harlow Asa. - Pathology: Medullary thyroid carcinoma with tumor confined to the right lobe measuring 4.5 x 3.4 x 3.5 cm.  Margins are free.  5/8 lymph nodes with metastatic medullary carcinoma.  PT3APN1A.  Angioinvasion/lymphatic invasion/extrathyroidal extension: Not identified.  Margins are negative. - Reports 10 pound weight loss in the last 3 months due to decreased appetite.    Social/family history: - She lives by herself at home.  She has an aide during the daytime 5 days a week.  She is able to do all her ADLs and IADLs.  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  She is a current active smoker, 1 pack/day for the last 53 years. - Maternal grandfather had?  Testicular cancer.  Sister had thyroid cancer.   PLAN:  Stage III (T3N1A) medullary thyroid cancer: - I have reviewed labs including calcitonin which was elevated at 131.  CEA was also elevated at 67. - She had genetic testing done on 11/18/2021 for RET mutations. - Plasma metanephrines and normetanephrine's are within normal limits.  CBC and LFTs are grossly normal. - Reviewed CT chest/neck/AP (11/16/2021: Lytic lesion with expansile soft tissue component involving the sternum measuring 2.2 x 3.6 x 6.4 cm.  Small right supraclavicular lymph node 0.6 cm.  2 soft tissue density nodules measuring 1 and 0.5 cm within the thoracic inlet on the right side of the esophagus.  No evidence of metastasis in the abdomen or pelvis. - Based on these findings I have recommended gallium-68 PET CT scan. - I have also recommended biopsy of the metastatic lesion, likely in the sternum or any other lesion that is easily accessible and safe. - RTC after biopsy.  2.  Thyroxine therapy: - She is taking levothyroxine  75 mcg daily.  She has TSH elevated at 44 on 11/22/2021.  She will follow-up with Dr. Dorris Fetch for further management.   Orders placed this encounter:  No orders of the defined types were placed in this encounter.    Derek Jack, MD [place] [phone]   I, Thana Ates, am acting as a scribe for Dr. Derek Jack.  I, Derek Jack MD, have reviewed the above documentation for accuracy and completeness, and I agree with the above.

## 2021-11-26 NOTE — Progress Notes (Unsigned)
Sandi Mariscal, MD  Donita Brooks D OK for CT guided sternal lesion Bx.   CT CAP - image 69, series 13.   New from remote CT from 2013 and I don't see and Tx spine compression Fractures to suggest this is related to an accentuated Tx kyphosis.   Cathren Harsh

## 2021-12-01 ENCOUNTER — Ambulatory Visit: Payer: Medicare Other | Admitting: "Endocrinology

## 2021-12-03 DIAGNOSIS — M47897 Other spondylosis, lumbosacral region: Secondary | ICD-10-CM | POA: Diagnosis not present

## 2021-12-03 DIAGNOSIS — J9690 Respiratory failure, unspecified, unspecified whether with hypoxia or hypercapnia: Secondary | ICD-10-CM | POA: Diagnosis not present

## 2021-12-03 DIAGNOSIS — J449 Chronic obstructive pulmonary disease, unspecified: Secondary | ICD-10-CM | POA: Diagnosis not present

## 2021-12-06 ENCOUNTER — Ambulatory Visit: Payer: Medicare Other | Admitting: "Endocrinology

## 2021-12-08 ENCOUNTER — Telehealth: Payer: Self-pay | Admitting: Licensed Clinical Social Worker

## 2021-12-10 ENCOUNTER — Other Ambulatory Visit: Payer: Self-pay

## 2021-12-10 DIAGNOSIS — C73 Malignant neoplasm of thyroid gland: Secondary | ICD-10-CM

## 2021-12-10 NOTE — Progress Notes (Signed)
[facility] PET scan order replaced with CU-64 PET scan order per Radiology.

## 2021-12-22 ENCOUNTER — Other Ambulatory Visit: Payer: Self-pay | Admitting: Radiology

## 2021-12-22 DIAGNOSIS — C73 Malignant neoplasm of thyroid gland: Secondary | ICD-10-CM

## 2021-12-23 ENCOUNTER — Ambulatory Visit (HOSPITAL_COMMUNITY)
Admission: RE | Admit: 2021-12-23 | Discharge: 2021-12-23 | Disposition: A | Discharge status: Home / Self Care | Payer: Medicare Other | Source: Ambulatory Visit | Attending: Hematology | Admitting: Hematology

## 2021-12-23 ENCOUNTER — Other Ambulatory Visit: Payer: Self-pay

## 2021-12-23 ENCOUNTER — Encounter (HOSPITAL_COMMUNITY): Payer: Self-pay

## 2021-12-23 DIAGNOSIS — C73 Malignant neoplasm of thyroid gland: Secondary | ICD-10-CM | POA: Insufficient documentation

## 2021-12-23 DIAGNOSIS — I700 Atherosclerosis of aorta: Secondary | ICD-10-CM | POA: Diagnosis not present

## 2021-12-23 DIAGNOSIS — C799 Secondary malignant neoplasm of unspecified site: Secondary | ICD-10-CM | POA: Insufficient documentation

## 2021-12-23 DIAGNOSIS — M898X8 Other specified disorders of bone, other site: Secondary | ICD-10-CM | POA: Diagnosis not present

## 2021-12-23 DIAGNOSIS — E890 Postprocedural hypothyroidism: Secondary | ICD-10-CM | POA: Diagnosis not present

## 2021-12-23 DIAGNOSIS — R059 Cough, unspecified: Secondary | ICD-10-CM | POA: Insufficient documentation

## 2021-12-23 DIAGNOSIS — M542 Cervicalgia: Secondary | ICD-10-CM | POA: Diagnosis not present

## 2021-12-23 DIAGNOSIS — R590 Localized enlarged lymph nodes: Secondary | ICD-10-CM | POA: Insufficient documentation

## 2021-12-23 LAB — CBC
HCT: 43.7 % (ref 36.0–46.0)
Hemoglobin: 14.4 g/dL (ref 12.0–15.0)
MCH: 28.2 pg (ref 26.0–34.0)
MCHC: 33 g/dL (ref 30.0–36.0)
MCV: 85.5 fL (ref 80.0–100.0)
Platelets: 263 10*3/uL (ref 150–400)
RBC: 5.11 MIL/uL (ref 3.87–5.11)
RDW: 14.9 % (ref 11.5–15.5)
WBC: 8 10*3/uL (ref 4.0–10.5)
nRBC: 0 % (ref 0.0–0.2)

## 2021-12-23 LAB — PROTIME-INR
INR: 1 (ref 0.8–1.2)
Prothrombin Time: 12.9 seconds (ref 11.4–15.2)

## 2021-12-23 MED ORDER — MIDAZOLAM HCL 2 MG/2ML IJ SOLN
INTRAMUSCULAR | Status: AC | PRN
  Administered 2021-12-23: 1 mg via INTRAVENOUS
  Administered 2021-12-23 (×2): .5 mg via INTRAVENOUS

## 2021-12-23 MED ORDER — SODIUM CHLORIDE 0.9 % IV SOLN: INTRAVENOUS | Status: DC

## 2021-12-23 MED ORDER — LIDOCAINE HCL 1 % IJ SOLN
10.0000 mL | Freq: Once | INTRAMUSCULAR | Status: AC
  Administered 2021-12-23: 10 mL via INTRADERMAL

## 2021-12-23 MED ORDER — FENTANYL CITRATE (PF) 100 MCG/2ML IJ SOLN
INTRAMUSCULAR | Status: AC | PRN
  Administered 2021-12-23 (×3): 25 ug via INTRAVENOUS

## 2021-12-23 MED ORDER — FENTANYL CITRATE (PF) 100 MCG/2ML IJ SOLN
INTRAMUSCULAR | Status: AC
  Filled 2021-12-23: qty 2

## 2021-12-23 MED ORDER — MIDAZOLAM HCL 2 MG/2ML IJ SOLN
INTRAMUSCULAR | Status: AC
  Filled 2021-12-23: qty 2

## 2021-12-23 NOTE — H&P (Addendum)
Chief Complaint: Patient was seen in consultation today for sternal lesion biopsy at the request of [facility]  Referring Physician(s): Derek Jack  Supervising Physician: Mir, Sharen Heck  Patient Status: Kristin Wells - Out-pt  History of Present Illness: Kristin Wells is a 71 y.o. female   New diagnosis medullary thyroid cancer Thyroidectomy 09/23/21 with Dr Harlow Asa Still with some pain in neck and chest--- +cough Follows with Dr Delton Coombes CT 11/16/21: IMPRESSION: 1. Status post thyroidectomy. Two soft tissue density nodules are identified along the right-side of the esophagus at the thoracic and which may represent residual thyroid tissue versus non pathologically enlarged mediastinal lymph nodes. 2. There is a destructive lesion with expansile soft tissue component involving the sternum worrisome for metastatic disease. 3. No evidence for solid organ metastasis or nodal metastasis within the abdomen or pelvis. 4. Multifocal bilateral areas of renal cortical scarring is identified. Focal area of hypodense soft tissue which exhibits mild postcontrast enhancement arises off the medial cortex of the upper pole of right kidney. This is favored to represent an area of subacute postinflammatory scarring versus sequelae of embolic disease. Clinical correlation is advised. 5. Aortic Atherosclerosis (ICD10-I70.0  Dr Raliegh Ip note 7/18: PLAN:  Stage III (T3N1A) medullary thyroid cancer: - I have reviewed labs including calcitonin which was elevated at 131.  CEA was also elevated at 67. - She had genetic testing done on 11/18/2021 for RET mutations. - Plasma metanephrines and normetanephrine's are within normal limits.  CBC and LFTs are grossly normal. - Reviewed CT chest/neck/AP (11/16/2021: Lytic lesion with expansile soft tissue component involving the sternum measuring 2.2 x 3.6 x 6.4 cm.  Small right supraclavicular lymph node 0.6 cm.  2 soft tissue density nodules measuring 1  and 0.5 cm within the thoracic inlet on the right side of the esophagus.  No evidence of metastasis in the abdomen or pelvis. - Based on these findings I have recommended gallium-68 PET CT scan. - I have also recommended biopsy of the metastatic lesion, likely in the sternum or any other lesion that is easily accessible and safe. - RTC after biopsy.  Scheduled now for sternal lesion biopsy  Past Medical History:  Diagnosis Date   Acute respiratory failure ([place]) 06/2011   Vent dependent sec to COPD/PNA   Anemia    Anxiety    Anxiety disorder, unspecified    Arthritis    Arthritis    Atherosclerotic heart disease of native coronary artery with unspecified angina pectoris (HCC)    Back pain    Bronchitis    CHF (congestive heart failure) (HCC)    Chronic obstructive pulmonary disease with (acute) exacerbation (HCC)    Chronic obstructive pulmonary disease, unspecified (HCC)    Chronic obstructive pulmonary disease, unspecified (HCC)    COPD (chronic obstructive pulmonary disease) (HCC)    Coronary artery disease    Demand ischemia of myocardium 06/28/2011   Depression    Diabetes mellitus without complication (HCC)    Dizziness and giddiness    Dyspnea    Edema, unspecified    GERD (gastroesophageal reflux disease)    Hyperlipidemia    Hyperlipidemia, unspecified    Hypertension    Hypertensive heart disease without heart failure    Hypothyroidism, unspecified    Insomnia, unspecified    Ischemic cardiomyopathy 06/28/2011   EF 25%. Myoview stress test later showed ejection fraction of 65%   Lumbago    Major depressive disorder, single episode, unspecified    MI, acute, non ST segment elevation ([place]) 06/29/2011  Myoview stress test revealed mild perfusion defect   Myocardial infarction [facility])    Neuralgia and neuritis, unspecified    Nicotine dependence, unspecified, uncomplicated    Obstructive chronic bronchitis with acute bronchitis (HCC)    Other acute osteomyelitis,  unspecified ankle and foot (HCC)    Overactive bladder    Oxygen deficiency    PNA (pneumonia) 06/29/2011   Pneumonia    Pneumonia    Primary thyroid malignancy ([place]) 03/26/2021   Rectocele 10/09/2012   Substance abuse ([place])    MANY YEARS AGO   Symptomatic menopausal or female climacteric states    Thyroid enlargement 06/28/2011   Type 2 diabetes mellitus with diabetic peripheral angiopathy without gangrene (HCC)    Type 2 diabetes mellitus with hyperglycemia ([place])    Unspecified convulsions (HCC)     Past Surgical History:  Procedure Laterality Date   COLONOSCOPY  Feb 2013   Dr. Michail Sermon: internal hemorrhoids, normal view of ileum, multiple hyperplastic polyps   ESOPHAGOGASTRODUODENOSCOPY  Feb 2013   Dr. Michail Sermon: normal duodenum, candida esophagitis, reactive gastropathy   THYROIDECTOMY N/A 09/23/2021   Procedure: TOTAL THYROIDECTOMY WITH LIMITED LYMPH NODE DISSECTION;  Surgeon: Armandina Gemma, MD;  Location: [facility];  Service: General;  Laterality: N/A;   TUBAL LIGATION      Allergies: Cat hair extract and Other  Medications: Prior to Admission medications   Medication Sig Start Date End Date Taking? Authorizing Provider  albuterol (VENTOLIN HFA) 108 (90 Base) MCG/ACT inhaler Inhale into the lungs. 10/21/21   [provider]  ALPRAZolam Duanne Moron) 0.25 MG tablet Take 0.25 mg by mouth 2 (two) times daily as needed for anxiety.    [provider]  aspirin EC 81 MG tablet Take 81 mg by mouth daily.    [provider]  blood glucose meter kit and supplies KIT Dispense based on patient and insurance preference. Use up to four times daily as directed. (FOR ICD-9 250.00, 250.01). 03/15/17   Raylene Everts, MD  BREZTRI AEROSPHERE 160-9-4.8 MCG/ACT AERO Inhale into the lungs. 08/25/20   [provider]  calcium carbonate (TUMS) 500 MG chewable tablet Chew 2 tablets (400 mg of elemental calcium total) by mouth 2 (two) times daily. 09/24/21   Armandina Gemma,  MD  cetirizine (ZYRTEC) 10 MG tablet Take 10 mg by mouth daily.    [provider]  cyclobenzaprine (FLEXERIL) 10 MG tablet Take 10 mg by mouth 2 (two) times daily.    [provider]  ergocalciferol (VITAMIN D2) 1.25 MG (50000 UT) capsule Take 50,000 Units by mouth once a week. Thursday    [provider]  furosemide (LASIX) 20 MG tablet Take 1 tablet (20 mg total) by mouth daily. 01/10/14   Lendon Colonel, NP  GLUCERNA (GLUCERNA) LIQD Take 237 mLs by mouth 3 (three) times daily between meals. 06/01/17   Raylene Everts, MD  guaiFENesin (MUCINEX) 600 MG 12 hr tablet Take 1 tablet (600 mg total) by mouth 2 (two) times daily. 01/31/17   Kathie Dike, MD  Incontinence Supply Disposable (POISE PANTILINERS) PADS 1 each by Does not apply route as needed. 06/01/17   Raylene Everts, MD  levothyroxine (SYNTHROID) 75 MCG tablet Take 75 mcg by mouth daily. 09/13/21   [provider]  metFORMIN (GLUCOPHAGE) 500 MG tablet Take 500 mg by mouth 2 (two) times daily with a meal. Extended release 02/22/13   [provider]  pantoprazole (PROTONIX) 40 MG tablet Take 40 mg by mouth daily.  [provider]  potassium chloride (K-DUR) 10 MEQ tablet Take 1 tablet (10 mEq total) by mouth daily. 08/22/16   Raylene Everts, MD  rosuvastatin (CRESTOR) 20 MG tablet Take 1 tablet (20 mg total) by mouth daily. 05/14/21   Fay Records, MD  traMADol (ULTRAM) 50 MG tablet Take 1-2 tablets (50-100 mg total) by mouth every 6 (six) hours as needed for moderate pain. 09/24/21   Armandina Gemma, MD     Family History  Problem Relation Age of Onset   Diabetes Mother    Hypertension Mother    Diabetes Father    Hypertension Father    Heart disease Father        HEART FAILURE   Seizures Sister    Uterine cancer Sister    Early death Sister        Pneumonia at 43 months old   Thyroid cancer Sister    Skin cancer Brother        [place] type   Alcohol abuse Brother     Cirrhosis Brother    Cancer Maternal Uncle    Early death Son    Drug abuse Son    Heart attack Son    Colon cancer Neg Hx     Social History   Socioeconomic History   Marital status: Single    Spouse name: Not on file   Number of children: Not on file   Years of education: Not on file   Highest education level: Not on file  Occupational History   Not on file  Tobacco Use   Smoking status: Every Day    Packs/day: 0.50    Years: 49.00    Total pack years: 24.50    Types: Cigarettes    Start date: 05/10/1967   Smokeless tobacco: Never   Tobacco comments:    START 1969  Vaping Use   Vaping Use: Never used  Substance and Sexual Activity   Alcohol use: No    Alcohol/week: 0.0 standard drinks of alcohol   Drug use: No   Sexual activity: Never    Birth control/protection: None, Post-menopausal  Other Topics Concern   Not on file  Social History Narrative   Not on file   Social Determinants of Health   Financial Resource Strain: Not on file  Food Insecurity: Not on file  Transportation Needs: Not on file  Physical Activity: Not on file  Stress: Not on file  Social Connections: Not on file    Review of Systems: A 12 point ROS discussed and pertinent positives are indicated in the HPI above.  All other systems are negative.  Review of Systems  Constitutional:  Negative for activity change, fatigue and fever.  HENT:  Negative for trouble swallowing.   Respiratory:  Positive for cough. Negative for shortness of breath.   Cardiovascular:  Negative for chest pain.  Gastrointestinal:  Negative for abdominal pain.  Psychiatric/Behavioral:  Negative for behavioral problems and confusion.     Vital Signs: There were no vitals taken for this visit.   Physical Exam Vitals reviewed.  HENT:     Mouth/Throat:     Mouth: Mucous membranes are moist.  Cardiovascular:     Rate and Rhythm: Normal rate and regular rhythm.     Heart sounds: Normal heart sounds.   Pulmonary:     Effort: Pulmonary effort is normal.     Breath sounds: Normal breath sounds. No wheezing.  Abdominal:     Palpations: Abdomen is soft.  Musculoskeletal:        General: Normal range of motion.  Skin:    General: Skin is warm.  Neurological:     Mental Status: She is alert and oriented to person, place, and time.  Psychiatric:        Behavior: Behavior normal.     Imaging: No results found.  Labs:  CBC: Recent Labs    10/28/21 1402  WBC 8.6  HGB 14.5  HCT 45.8  PLT 305    COAGS: No results for input(s): "INR", "APTT" in the last 8760 hours.  BMP: Recent Labs    09/24/21 0443 10/28/21 1402  NA 137 141  K 4.8 3.9  CL 101 103  CO2 29 30  GLUCOSE 128* 104*  BUN 22 10  CALCIUM 9.0 9.1  CREATININE 1.49* 1.13*  GFRNONAA 38* 53*    LIVER FUNCTION TESTS: Recent Labs    10/28/21 1402  BILITOT 0.3  AST 17  ALT 23  ALKPHOS 89  PROT 7.2  ALBUMIN 3.6    TUMOR MARKERS: No results for input(s): "AFPTM", "CEA", "CA199", "CHROMGRNA" in the last 8760 hours.  Assessment and Plan:  Hx Thyroid cancer New lesion seen at sternum in CT- worrisome for metastasis Scheduled for sternal lesion biopsy per Dr Delton Coombes Risks and benefits of sternal lesion biopsy was discussed with the patient and/or patient's family including, but not limited to bleeding, infection, damage to adjacent structures or low yield requiring additional tests.  All of the questions were answered and there is agreement to proceed. Consent signed and in chart.     Thank you for this interesting consult.  I greatly enjoyed meeting Kristin Wells and look forward to participating in their care.  A copy of this report was sent to the requesting provider on this date.  Electronically Signed: Lavonia Drafts, PA-C 12/23/2021, 9:28 AM   I spent a total of  30 Minutes   in face to face in clinical consultation, greater than 50% of which was counseling/coordinating care for sternal  lesion biopsy

## 2021-12-23 NOTE — Progress Notes (Signed)
Patient and care giver was given discharge instructions. Both verbalized understanding.

## 2021-12-23 NOTE — Procedures (Signed)
Interventional Radiology Procedure Note  Procedure: CT guided biopsy of Sternal Mass  Indication: Sternal metastasis  Findings: Please refer to procedural dictation for full description.  Complications: None  EBL: < 10 mL  Miachel Roux, MD [phone]

## 2021-12-27 LAB — SURGICAL PATHOLOGY

## 2021-12-28 ENCOUNTER — Other Ambulatory Visit: Payer: Self-pay

## 2021-12-28 DIAGNOSIS — C73 Malignant neoplasm of thyroid gland: Secondary | ICD-10-CM

## 2021-12-29 ENCOUNTER — Encounter: Payer: Self-pay | Admitting: Licensed Clinical Social Worker

## 2021-12-29 DIAGNOSIS — Z1379 Encounter for other screening for genetic and chromosomal anomalies: Secondary | ICD-10-CM | POA: Insufficient documentation

## 2021-12-29 NOTE — Telephone Encounter (Signed)
Attempted to reach Ms. Tiznado/ caregiver Leanne x3 with genetic test results. Sent letter with instructions to call me if they would still like to go over these results.

## 2021-12-29 NOTE — Progress Notes (Signed)
[place] testing ordered on [phone] per Dr. Delton Coombes.

## 2021-12-30 ENCOUNTER — Other Ambulatory Visit: Payer: Self-pay

## 2021-12-30 DIAGNOSIS — C73 Malignant neoplasm of thyroid gland: Secondary | ICD-10-CM

## 2021-12-30 NOTE — Progress Notes (Signed)
Order for CU-64 PET scan replaced per Dr. Delton Coombes verbal order.

## 2021-12-31 ENCOUNTER — Encounter (HOSPITAL_COMMUNITY): Admission: RE | Admit: 2021-12-31 | Payer: Medicare Other | Source: Ambulatory Visit

## 2021-12-31 ENCOUNTER — Encounter (HOSPITAL_COMMUNITY): Payer: Self-pay

## 2021-12-31 ENCOUNTER — Encounter (HOSPITAL_COMMUNITY)
Admission: RE | Admit: 2021-12-31 | Discharge: 2021-12-31 | Disposition: A | Discharge status: Home / Self Care | Payer: Medicare Other | Source: Ambulatory Visit | Attending: Hematology | Admitting: Hematology

## 2021-12-31 DIAGNOSIS — M5134 Other intervertebral disc degeneration, thoracic region: Secondary | ICD-10-CM | POA: Diagnosis not present

## 2021-12-31 DIAGNOSIS — C73 Malignant neoplasm of thyroid gland: Secondary | ICD-10-CM | POA: Diagnosis not present

## 2021-12-31 DIAGNOSIS — M5136 Other intervertebral disc degeneration, lumbar region: Secondary | ICD-10-CM | POA: Diagnosis not present

## 2021-12-31 MED ORDER — TECHNETIUM TC 99M MEDRONATE IV KIT
20.0000 | PACK | Freq: Once | INTRAVENOUS | Status: AC | PRN
  Administered 2021-12-31: 21 via INTRAVENOUS

## 2022-01-03 DIAGNOSIS — M47897 Other spondylosis, lumbosacral region: Secondary | ICD-10-CM | POA: Diagnosis not present

## 2022-01-03 DIAGNOSIS — J9690 Respiratory failure, unspecified, unspecified whether with hypoxia or hypercapnia: Secondary | ICD-10-CM | POA: Diagnosis not present

## 2022-01-03 DIAGNOSIS — J449 Chronic obstructive pulmonary disease, unspecified: Secondary | ICD-10-CM | POA: Diagnosis not present

## 2022-01-11 ENCOUNTER — Inpatient Hospital Stay: Payer: Medicare Other | Attending: Hematology | Admitting: Hematology

## 2022-01-11 DIAGNOSIS — F17210 Nicotine dependence, cigarettes, uncomplicated: Secondary | ICD-10-CM | POA: Insufficient documentation

## 2022-01-11 DIAGNOSIS — G8929 Other chronic pain: Secondary | ICD-10-CM | POA: Insufficient documentation

## 2022-01-11 DIAGNOSIS — C73 Malignant neoplasm of thyroid gland: Secondary | ICD-10-CM | POA: Insufficient documentation

## 2022-01-11 DIAGNOSIS — C7951 Secondary malignant neoplasm of bone: Secondary | ICD-10-CM | POA: Insufficient documentation

## 2022-01-11 DIAGNOSIS — M549 Dorsalgia, unspecified: Secondary | ICD-10-CM | POA: Insufficient documentation

## 2022-01-17 DIAGNOSIS — C73 Malignant neoplasm of thyroid gland: Secondary | ICD-10-CM | POA: Diagnosis not present

## 2022-01-18 DIAGNOSIS — G894 Chronic pain syndrome: Secondary | ICD-10-CM | POA: Diagnosis not present

## 2022-01-18 DIAGNOSIS — E119 Type 2 diabetes mellitus without complications: Secondary | ICD-10-CM | POA: Diagnosis not present

## 2022-01-18 DIAGNOSIS — E782 Mixed hyperlipidemia: Secondary | ICD-10-CM | POA: Diagnosis not present

## 2022-01-18 DIAGNOSIS — E039 Hypothyroidism, unspecified: Secondary | ICD-10-CM | POA: Diagnosis not present

## 2022-01-18 DIAGNOSIS — N1831 Chronic kidney disease, stage 3a: Secondary | ICD-10-CM | POA: Diagnosis not present

## 2022-01-18 DIAGNOSIS — I10 Essential (primary) hypertension: Secondary | ICD-10-CM | POA: Diagnosis not present

## 2022-01-18 DIAGNOSIS — K219 Gastro-esophageal reflux disease without esophagitis: Secondary | ICD-10-CM | POA: Diagnosis not present

## 2022-01-18 DIAGNOSIS — E1165 Type 2 diabetes mellitus with hyperglycemia: Secondary | ICD-10-CM | POA: Diagnosis not present

## 2022-01-18 DIAGNOSIS — I509 Heart failure, unspecified: Secondary | ICD-10-CM | POA: Diagnosis not present

## 2022-01-18 DIAGNOSIS — R809 Proteinuria, unspecified: Secondary | ICD-10-CM | POA: Diagnosis not present

## 2022-01-25 ENCOUNTER — Ambulatory Visit (INDEPENDENT_AMBULATORY_CARE_PROVIDER_SITE_OTHER): Payer: Medicare Other | Admitting: "Endocrinology

## 2022-01-25 ENCOUNTER — Encounter: Payer: Self-pay | Admitting: "Endocrinology

## 2022-01-25 VITALS — BP 96/68 | HR 96 | Ht 60.0 in | Wt 117.0 lb

## 2022-01-25 DIAGNOSIS — E890 Postprocedural hypothyroidism: Secondary | ICD-10-CM | POA: Diagnosis not present

## 2022-01-25 DIAGNOSIS — C73 Malignant neoplasm of thyroid gland: Secondary | ICD-10-CM | POA: Diagnosis not present

## 2022-01-25 DIAGNOSIS — F17200 Nicotine dependence, unspecified, uncomplicated: Secondary | ICD-10-CM

## 2022-01-25 MED ORDER — LEVOTHYROXINE SODIUM 88 MCG PO TABS
88.0000 ug | ORAL_TABLET | Freq: Every day | ORAL | 1 refills | Status: DC
Start: 2022-01-25 — End: 2022-01-27

## 2022-01-25 NOTE — Progress Notes (Signed)
01/25/2022, 1:13 PM  Endocrinology follow-up note   Subjective:    Patient ID: Kristin Wells, female    DOB: [birth year], PCP Celene Squibb, MD   Past Medical History:  Diagnosis Date   Acute respiratory failure ([place]) 06/2011   Vent dependent sec to COPD/PNA   Anemia    Anxiety    Anxiety disorder, unspecified    Arthritis    Arthritis    Atherosclerotic heart disease of native coronary artery with unspecified angina pectoris (HCC)    Back pain    Bronchitis    CHF (congestive heart failure) (HCC)    Chronic obstructive pulmonary disease with (acute) exacerbation (HCC)    Chronic obstructive pulmonary disease, unspecified (HCC)    Chronic obstructive pulmonary disease, unspecified (HCC)    COPD (chronic obstructive pulmonary disease) ([place])    Coronary artery disease    Demand ischemia of myocardium 06/28/2011   Depression    Diabetes mellitus without complication (HCC)    Dizziness and giddiness    Dyspnea    Edema, unspecified    GERD (gastroesophageal reflux disease)    Hyperlipidemia    Hyperlipidemia, unspecified    Hypertension    Hypertensive heart disease without heart failure    Hypothyroidism, unspecified    Insomnia, unspecified    Ischemic cardiomyopathy 06/28/2011   EF 25%. Myoview stress test later showed ejection fraction of 65%   Lumbago    Major depressive disorder, single episode, unspecified    MI, acute, non ST segment elevation (HCC) 06/29/2011   Myoview stress test revealed mild perfusion defect   Myocardial infarction [facility])    Neuralgia and neuritis, unspecified    Nicotine dependence, unspecified, uncomplicated    Obstructive chronic bronchitis with acute bronchitis ([place])    Other acute osteomyelitis, unspecified ankle and foot (HCC)    Overactive bladder    Oxygen deficiency    PNA (pneumonia) 06/29/2011   Pneumonia    Pneumonia    Primary thyroid malignancy ([place]) 03/26/2021    Rectocele 10/09/2012   Substance abuse ([place])    MANY YEARS AGO   Symptomatic menopausal or female climacteric states    Thyroid enlargement 06/28/2011   Type 2 diabetes mellitus with diabetic peripheral angiopathy without gangrene (HCC)    Type 2 diabetes mellitus with hyperglycemia ([place])    Unspecified convulsions ([place])    Past Surgical History:  Procedure Laterality Date   COLONOSCOPY  Feb 2013   Dr. Michail Sermon: internal hemorrhoids, normal view of ileum, multiple hyperplastic polyps   ESOPHAGOGASTRODUODENOSCOPY  Feb 2013   Dr. Michail Sermon: normal duodenum, candida esophagitis, reactive gastropathy   THYROIDECTOMY N/A 09/23/2021   Procedure: TOTAL THYROIDECTOMY WITH LIMITED LYMPH NODE DISSECTION;  Surgeon: Armandina Gemma, MD;  Location: [facility];  Service: General;  Laterality: N/A;   TUBAL LIGATION     Social History   Socioeconomic History   Marital status: Single    Spouse name: Not on file   Number of children: Not on file   Years of education: Not on file   Highest education level: Not on file  Occupational History   Not on file  Tobacco Use   Smoking status: Every Day    Packs/day: 0.50  Years: 49.00    Total pack years: 24.50    Types: Cigarettes    Start date: 05/10/1967   Smokeless tobacco: Never   Tobacco comments:    START 1969  Vaping Use   Vaping Use: Never used  Substance and Sexual Activity   Alcohol use: No    Alcohol/week: 0.0 standard drinks of alcohol   Drug use: No   Sexual activity: Never    Birth control/protection: None, Post-menopausal  Other Topics Concern   Not on file  Social History Narrative   Not on file   Social Determinants of Health   Financial Resource Strain: Not on file  Food Insecurity: Not on file  Transportation Needs: Not on file  Physical Activity: Not on file  Stress: Not on file  Social Connections: Not on file   Family History  Problem Relation Age of Onset   Diabetes Mother    Hypertension Mother    Diabetes Father     Hypertension Father    Heart disease Father        HEART FAILURE   Seizures Sister    Uterine cancer Sister    Early death Sister        Pneumonia at 23 months old   Thyroid cancer Sister    Skin cancer Brother        [place] type   Alcohol abuse Brother    Cirrhosis Brother    Cancer Maternal Uncle    Early death Son    Drug abuse Son    Heart attack Son    Colon cancer Neg Hx    Outpatient Encounter Medications as of 01/25/2022  Medication Sig   albuterol (VENTOLIN HFA) 108 (90 Base) MCG/ACT inhaler Inhale into the lungs.   ALPRAZolam (XANAX) 0.25 MG tablet Take 0.25 mg by mouth 2 (two) times daily as needed for anxiety.   aspirin EC 81 MG tablet Take 81 mg by mouth daily.   blood glucose meter kit and supplies KIT Dispense based on patient and insurance preference. Use up to four times daily as directed. (FOR ICD-9 250.00, 250.01).   BREZTRI AEROSPHERE 160-9-4.8 MCG/ACT AERO Inhale into the lungs.   calcium carbonate (TUMS) 500 MG chewable tablet Chew 2 tablets (400 mg of elemental calcium total) by mouth 2 (two) times daily.   cetirizine (ZYRTEC) 10 MG tablet Take 10 mg by mouth daily.   cyclobenzaprine (FLEXERIL) 10 MG tablet Take 10 mg by mouth 2 (two) times daily.   DULoxetine (CYMBALTA) 60 MG capsule Take 60 mg by mouth daily. (Patient not taking: Reported on 01/25/2022)   ergocalciferol (VITAMIN D2) 1.25 MG (50000 UT) capsule Take 50,000 Units by mouth once a week. Thursday   furosemide (LASIX) 20 MG tablet Take 1 tablet (20 mg total) by mouth daily.   GLUCERNA (GLUCERNA) LIQD Take 237 mLs by mouth 3 (three) times daily between meals.   guaiFENesin (MUCINEX) 600 MG 12 hr tablet Take 1 tablet (600 mg total) by mouth 2 (two) times daily.   Incontinence Supply Disposable (POISE PANTILINERS) PADS 1 each by Does not apply route as needed.   levothyroxine (SYNTHROID) 88 MCG tablet Take 1 tablet (88 mcg total) by mouth daily before breakfast.   metFORMIN (GLUCOPHAGE) 500 MG  tablet Take 500 mg by mouth 2 (two) times daily with a meal. Extended release   pantoprazole (PROTONIX) 40 MG tablet Take 40 mg by mouth daily.   potassium chloride (K-DUR) 10 MEQ tablet Take 1 tablet (10 mEq total)  by mouth daily.   rosuvastatin (CRESTOR) 20 MG tablet Take 1 tablet (20 mg total) by mouth daily.   traMADol (ULTRAM) 50 MG tablet Take 1-2 tablets (50-100 mg total) by mouth every 6 (six) hours as needed for moderate pain.   [DISCONTINUED] levothyroxine (SYNTHROID) 75 MCG tablet Take 75 mcg by mouth daily.   No facility-administered encounter medications on file as of 01/25/2022.   ALLERGIES: Allergies  Allergen Reactions   Cat Hair Extract Cough    sneezing   Other Cough    Coughing and sneezing d/t some cats, dogs and perfumes    VACCINATION STATUS: Immunization History  Administered Date(s) Administered   Influenza Inj Mdck Quad Pf 04/29/2016   Influenza Split 06/29/2011   Influenza,inj,Quad PF,6+ Mos 01/30/2013, 05/25/2017   Influenza-Unspecified 02/20/2014, 02/17/2015, 04/29/2016, 04/01/2019   PPD Test 08/29/2012   Pneumococcal Conjugate-13 05/25/2017   Pneumococcal Polysaccharide-23 06/29/2011, 04/01/2019    HPI AFRICA MASAKI is 71 y.o. female who presents today with a medical history as above. she was seen in June 2022 for hypothyroidism and found to have goiter.  She has a serious non-adherence to follow up and recommendations. After long absence from clinic, she reappeared last November with a solitary nodule in the right lobe with malignant cytology. She was referred to surgery. She had to have several medical evaluations due to her high risk status. She underwent surgery in may 2023, revealing a locally invasive medullary thyroid cancer. Her surgery was performed by Dr. Harlow Asa.  Subsequently, she was seen by oncology.  CT scan of chest and abdomen showed metastatic disease on the sternum, spine and possibly mediastinum. Biopsy of the sternal lesion revealed  medullary thyroid carcinoma. She remains on levothyroxine 75 mcg p.o. daily before breakfast.   She is accompanied by her care taker today.  Her calcitonin and CEA levels are still high.   She remains a heavy  chronic smoker.  She reports fatigue.  She has well-controlled diabetes, hypertension, hyperlipidemia. Her medical history is complicated by coronary artery disease, COPD, stage 2 renal insufficiency,Major depression. She reports significantly fluctuating body weight.  Shortness of breath on exertion.  she denies dysphagia, nor voice change.      Review of Systems  Constitutional: +fluctuating body weight, + fatigue, no subjective hyperthermia, no subjective hypothermia Eyes: no blurry vision, no xerophthalmia ENT: no sore throat, no nodules palpated in throat  Objective:       01/25/2022    1:01 PM 12/23/2021    1:05 PM 12/23/2021   12:45 PM  Vitals with BMI  Height $Remov'5\' 0"'KjqHdY$     Weight 117 lbs    BMI 36.12    Systolic 96 244 975  Diastolic 68 300 80  Pulse 96 97 95    BP 96/68   Pulse 96   Ht 5' (1.524 m)   Wt 117 lb (53.1 kg)   BMI 22.85 kg/m   Wt Readings from Last 3 Encounters:  01/25/22 117 lb (53.1 kg)  12/23/21 126 lb (57.2 kg)  11/23/21 124 lb 8 oz (56.5 kg)    Physical Exam  Constitutional:  Body mass index is 22.85 kg/m.,  not in acute distress, normal state of mind Eyes: PERRLA, EOMI, no exophthalmos ENT: moist mucous membranes, + gross thyromegaly, + gross cervical lymphadenopathy   CMP ( most recent) CMP     Component Value Date/Time   NA 141 10/28/2021 1402   K 3.9 10/28/2021 1402   CL 103 10/28/2021 1402   CO2  30 10/28/2021 1402   GLUCOSE 104 (H) 10/28/2021 1402   BUN 10 10/28/2021 1402   BUN 10 08/19/2020 0000   CREATININE 1.13 (H) 10/28/2021 1402   CREATININE 0.97 08/22/2017 1415   CALCIUM 9.1 10/28/2021 1402   PROT 7.2 10/28/2021 1402   ALBUMIN 3.6 10/28/2021 1402   AST 17 10/28/2021 1402   ALT 23 10/28/2021 1402   ALKPHOS 89  10/28/2021 1402   BILITOT 0.3 10/28/2021 1402   GFRNONAA 53 (L) 10/28/2021 1402   GFRNONAA 61 08/22/2017 1415   GFRAA 71 08/22/2017 1415     Diabetic Labs (most recent): Lab Results  Component Value Date   HGBA1C 6.1 (H) 09/23/2021   HGBA1C 5.9 08/19/2020   HGBA1C 5.8 (H) 08/22/2017   MICROALBUR 0.5 08/22/2017   MICROALBUR 1.0 08/19/2016     Lipid Panel ( most recent) Lipid Panel     Component Value Date/Time   CHOL 143 08/19/2020 0000   TRIG 125 08/19/2020 0000   HDL 46 08/19/2020 0000   CHOLHDL 2.4 08/22/2017 1415   VLDL 22 08/19/2016 1435   LDLCALC 75 08/19/2020 0000   LDLCALC 57 08/22/2017 1415      Lab Results  Component Value Date   TSH 44.937 (H) 11/22/2021   TSH 6.595 (H) 02/19/2021   TSH 4.99 08/19/2020   TSH 4.54 (H) 08/22/2017   TSH 2.83 08/19/2016   TSH 4.65 06/19/2013   TSH 3.199 10/04/2012   TSH 1.325 06/28/2011   TSH 0.982 01/09/2010   TSH 0.982 01/09/2010   FREET4 0.82 11/22/2021   FREET4 1.14 (H) 02/19/2021   FREET4 1.00 06/28/2011    Thyroid ultrasound on February 01, 2021 IMPRESSION: Solitary right superior thyroid nodule (labeled 1, 3.9 cm) which meets criteria (TI-RADS category 5) for tissue sampling. Recommend ultrasound-guided fine-needle aspiration.    Fine-needle aspiration of thyroid nodule on the right lobe on March 21, 2021 FINAL MICROSCOPIC DIAGNOSIS:  - Malignant cells present (Bethesda category VI)   SPECIMEN ADEQUACY:  Satisfactory for evaluation .  09/23/2021  FINAL MICROSCOPIC DIAGNOSIS:   A. THYROID, TOTAL THYROIDECTOMY:  Medullary thyroid gastric cancer is absent  (?) carcinoma  Tumor is confined to the right lobe and measures 4.5 x 3.4 x 3.5 cm  (pT3a)  Margins free ,  Background chronic lymphocytic thyroiditis   B. LYMPH NODES, CENTRAL COMPARTMENT, ZONE VI, REGIONAL RESECTION:  Five of eight lymph nodes with metastatic medullary carcinoma (5/8,  pN1a)    CT chest/abdomen on November 16, 2021 IMPRESSION: 1. Status post thyroidectomy. Two soft tissue density nodules are identified along the right-side of the esophagus at the thoracic and which may represent residual thyroid tissue versus non pathologically enlarged mediastinal lymph nodes. 2. There is a destructive lesion with expansile soft tissue component involving the sternum worrisome for metastatic disease. 3. No evidence for solid organ metastasis or nodal metastasis within the abdomen or pelvis. 4. Multifocal bilateral areas of renal cortical scarring is identified. Focal area of hypodense soft tissue which exhibits mild postcontrast enhancement arises off the medial cortex of the upper pole of right kidney. This is favored to represent an area of subacute postinflammatory scarring versus sequelae of embolic disease. Clinical correlation is advised. 5. Aortic Atherosclerosis (ICD10-I70.0).   External biopsy December 23, 2021 FINAL MICROSCOPIC DIAGNOSIS:   A. STERNAL MASS, BONE BIOPSY:  Metastatic medullary carcinoma   Assessment & Plan:   1. Hypothyroidism due to   Hashimoto's thyroiditis and recent thyroidectomy 2.   Medullary thyroid cancer. 3.  Metastatic medullary thyroid cancer sternum, spine,  mediastinum  I discussed her surgical findings with her and her care taker. She is s/p total thyroidectomy . Surgical pathology showing pT3a MTC confined to the right lobe, margins free, background chronic lymphocytic thyroiditis. 5 Lymph nodes with metastatic MTC. Her most recent work-up also shows distant metastasis, see above.  She is advised to keep her appointment with her oncologist to decide whether she needs additional treatment.  Her calcitonin and CEA were significantly elevated.   She is advised to increase her levothyroxine to 88 mcg p.o. daily before breakfast.     - We discussed about the correct intake of her thyroid hormone, on empty stomach at fasting, with water, separated by at least 30  minutes from breakfast and other medications,  and separated by more than 4 hours from calcium, iron, multivitamins, acid reflux medications (PPIs). -Patient is made aware of the fact that thyroid hormone replacement is needed for life, dose to be adjusted by periodic monitoring of thyroid function tests.   The patient was counseled on the dangers of tobacco use, and was advised to quit.  Reviewed strategies to maximize success, including removing cigarettes and smoking materials from environment.    She has well-controlled type 2 diabetes with A1c of 6.1%, will not need any medications for diabetes at this time.  - I did not initiate any new prescriptions today. - she is advised to maintain close follow up with Celene Squibb, MD for primary care needs.    I spent 33 minutes in the care of the patient today including review of labs from Thyroid Function, CMP, and other relevant labs ; imaging/biopsy records (current and previous including abstractions from other facilities); face-to-face time discussing  her lab results and symptoms, medications doses, her options of short and long term treatment based on the latest standards of care / guidelines;   and documenting the encounter.  Kristin Wells  participated in the discussions, expressed understanding, and voiced agreement with the above plans.  All questions were answered to her satisfaction. she is encouraged to contact clinic should she have any questions or concerns prior to her return visit.  Follow up plan: Return in about 3 months (around April 27, 2022) for F/U with Pre-visit Labs.   Glade Lloyd, MD [facility] [address] [place], [place] [address] Phone: [phone]  Fax: [phone]     01/25/2022, 1:13 PM  This note was partially dictated with voice recognition software. Similar sounding words can be transcribed inadequately or may not  be corrected upon review.

## 2022-01-27 ENCOUNTER — Other Ambulatory Visit: Payer: Self-pay

## 2022-01-27 DIAGNOSIS — E890 Postprocedural hypothyroidism: Secondary | ICD-10-CM

## 2022-01-27 MED ORDER — LEVOTHYROXINE SODIUM 88 MCG PO TABS: 88.0000 ug | ORAL_TABLET | Freq: Every day | ORAL | 1 refills | Status: DC

## 2022-01-28 ENCOUNTER — Encounter (HOSPITAL_COMMUNITY): Payer: Self-pay

## 2022-02-01 ENCOUNTER — Inpatient Hospital Stay (HOSPITAL_BASED_OUTPATIENT_CLINIC_OR_DEPARTMENT_OTHER): Payer: Medicare Other | Admitting: Hematology

## 2022-02-01 ENCOUNTER — Encounter: Payer: Self-pay | Admitting: Lab

## 2022-02-01 ENCOUNTER — Other Ambulatory Visit: Payer: Self-pay

## 2022-02-01 VITALS — BP 95/71 | HR 103 | Temp 98.6°F | Resp 19 | Ht 65.0 in | Wt 120.0 lb

## 2022-02-01 DIAGNOSIS — C73 Malignant neoplasm of thyroid gland: Secondary | ICD-10-CM | POA: Diagnosis not present

## 2022-02-01 DIAGNOSIS — G8929 Other chronic pain: Secondary | ICD-10-CM | POA: Diagnosis not present

## 2022-02-01 DIAGNOSIS — C7951 Secondary malignant neoplasm of bone: Secondary | ICD-10-CM | POA: Diagnosis not present

## 2022-02-01 DIAGNOSIS — F17210 Nicotine dependence, cigarettes, uncomplicated: Secondary | ICD-10-CM | POA: Diagnosis not present

## 2022-02-01 DIAGNOSIS — M549 Dorsalgia, unspecified: Secondary | ICD-10-CM | POA: Diagnosis not present

## 2022-02-01 NOTE — Progress Notes (Unsigned)
Referral sent to [facility].  Records faxed on 9/26

## 2022-02-01 NOTE — Progress Notes (Signed)
[facility] [address] [place] [address]   CLINIC:  Medical Oncology/Hematology  PCP:  Celene Squibb, MD [address] [place] [address] [phone]   REASON FOR VISIT:  Follow-up for stage IV medullary thyroid cancer.  PRIOR THERAPY: none  NGS Results: RET fusion not detected.  MSI-stable.  TMB-low.  PD-L1-negative.  BRAF pathogenic fusion without mutation.  No other targetable mutations.  CURRENT THERAPY: Surveillance  BRIEF ONCOLOGIC HISTORY:  Oncology History   No history exists.    CANCER STAGING:  Cancer Staging  Medullary thyroid carcinoma [facility]) Staging form: Thyroid - Medullary, AJCC 8th Edition - Clinical stage from 10/27/2021: Stage III (cT3a, cN1a, cM0) - Unsigned   INTERVAL HISTORY:  Ms. Kristin Wells, a 71 y.o. female, seen for follow-up of her metastatic medullary thyroid carcinoma.  She reports pain in the sternal region when she coughs.  Appetite is 50%.  No other pains reported.  Chronic back pain is stable.  REVIEW OF SYSTEMS:  Review of Systems  Constitutional:  Negative for appetite change and fatigue.  Respiratory:  Positive for cough.   Cardiovascular:  Positive for chest pain (Sternal pain while coughing).  Gastrointestinal:  Positive for constipation.  Neurological:  Positive for dizziness.  Psychiatric/Behavioral:  Positive for depression and sleep disturbance. The patient is nervous/anxious.   All other systems reviewed and are negative.   PAST MEDICAL/SURGICAL HISTORY:  Past Medical History:  Diagnosis Date   Acute respiratory failure ([place]) 06/2011   Vent dependent sec to COPD/PNA   Anemia    Anxiety    Anxiety disorder, unspecified    Arthritis    Arthritis    Atherosclerotic heart disease of native coronary artery with unspecified angina pectoris (HCC)    Back pain    Bronchitis    CHF (congestive heart failure) (HCC)    Chronic obstructive pulmonary disease with (acute) exacerbation (HCC)     Chronic obstructive pulmonary disease, unspecified (HCC)    Chronic obstructive pulmonary disease, unspecified (HCC)    COPD (chronic obstructive pulmonary disease) (HCC)    Coronary artery disease    Demand ischemia of myocardium 06/28/2011   Depression    Diabetes mellitus without complication (HCC)    Dizziness and giddiness    Dyspnea    Edema, unspecified    GERD (gastroesophageal reflux disease)    Hyperlipidemia    Hyperlipidemia, unspecified    Hypertension    Hypertensive heart disease without heart failure    Hypothyroidism, unspecified    Insomnia, unspecified    Ischemic cardiomyopathy 06/28/2011   EF 25%. Myoview stress test later showed ejection fraction of 65%   Lumbago    Major depressive disorder, single episode, unspecified    MI, acute, non ST segment elevation (HCC) 06/29/2011   Myoview stress test revealed mild perfusion defect   Myocardial infarction [facility])    Neuralgia and neuritis, unspecified    Nicotine dependence, unspecified, uncomplicated    Obstructive chronic bronchitis with acute bronchitis ([place])    Other acute osteomyelitis, unspecified ankle and foot (HCC)    Overactive bladder    Oxygen deficiency    PNA (pneumonia) 06/29/2011   Pneumonia    Pneumonia    Primary thyroid malignancy ([place]) 03/26/2021   Rectocele 10/09/2012   Substance abuse ([place])    MANY YEARS AGO   Symptomatic menopausal or female climacteric states    Thyroid enlargement 06/28/2011   Type 2 diabetes mellitus with diabetic peripheral angiopathy  without gangrene ([place])    Type 2 diabetes mellitus with hyperglycemia ([place])    Unspecified convulsions ([place])    Past Surgical History:  Procedure Laterality Date   COLONOSCOPY  Feb 2013   Dr. Michail Sermon: internal hemorrhoids, normal view of ileum, multiple hyperplastic polyps   ESOPHAGOGASTRODUODENOSCOPY  Feb 2013   Dr. Michail Sermon: normal duodenum, candida esophagitis, reactive gastropathy   THYROIDECTOMY N/A 09/23/2021    Procedure: TOTAL THYROIDECTOMY WITH LIMITED LYMPH NODE DISSECTION;  Surgeon: Armandina Gemma, MD;  Location: [facility];  Service: General;  Laterality: N/A;   TUBAL LIGATION      SOCIAL HISTORY:  Social History   Socioeconomic History   Marital status: Single    Spouse name: Not on file   Number of children: Not on file   Years of education: Not on file   Highest education level: Not on file  Occupational History   Not on file  Tobacco Use   Smoking status: Every Day    Packs/day: 0.50    Years: 49.00    Total pack years: 24.50    Types: Cigarettes    Start date: 05/10/1967   Smokeless tobacco: Never   Tobacco comments:    START 1969  Vaping Use   Vaping Use: Never used  Substance and Sexual Activity   Alcohol use: No    Alcohol/week: 0.0 standard drinks of alcohol   Drug use: No   Sexual activity: Never    Birth control/protection: None, Post-menopausal  Other Topics Concern   Not on file  Social History Narrative   Not on file   Social Determinants of Health   Financial Resource Strain: Not on file  Food Insecurity: Not on file  Transportation Needs: Not on file  Physical Activity: Not on file  Stress: Not on file  Social Connections: Not on file  Intimate Partner Violence: Not on file    FAMILY HISTORY:  Family History  Problem Relation Age of Onset   Diabetes Mother    Hypertension Mother    Diabetes Father    Hypertension Father    Heart disease Father        HEART FAILURE   Seizures Sister    Uterine cancer Sister    Early death Sister        Pneumonia at 75 months old   Thyroid cancer Sister    Skin cancer Brother        [place] type   Alcohol abuse Brother    Cirrhosis Brother    Cancer Maternal Uncle    Early death Son    Drug abuse Son    Heart attack Son    Colon cancer Neg Hx     CURRENT MEDICATIONS:  Current Outpatient Medications  Medication Sig Dispense Refill   albuterol (VENTOLIN HFA) 108 (90 Base) MCG/ACT inhaler Inhale into the  lungs.     ALPRAZolam (XANAX) 0.25 MG tablet Take 0.25 mg by mouth 2 (two) times daily as needed for anxiety.     aspirin EC 81 MG tablet Take 81 mg by mouth daily.     blood glucose meter kit and supplies KIT Dispense based on patient and insurance preference. Use up to four times daily as directed. (FOR ICD-9 250.00, 250.01). 1 each 0   BREZTRI AEROSPHERE 160-9-4.8 MCG/ACT AERO Inhale into the lungs.     calcium carbonate (TUMS) 500 MG chewable tablet Chew 2 tablets (400 mg of elemental calcium total) by mouth 2 (two) times daily. 90 tablet 1  cetirizine (ZYRTEC) 10 MG tablet Take 10 mg by mouth daily.     cyclobenzaprine (FLEXERIL) 10 MG tablet Take 10 mg by mouth 2 (two) times daily.     DULoxetine (CYMBALTA) 60 MG capsule Take 60 mg by mouth daily.     ergocalciferol (VITAMIN D2) 1.25 MG (50000 UT) capsule Take 50,000 Units by mouth once a week. Thursday     furosemide (LASIX) 20 MG tablet Take 1 tablet (20 mg total) by mouth daily. 30 tablet 6   GLUCERNA (GLUCERNA) LIQD Take 237 mLs by mouth 3 (three) times daily between meals. 90 Can 0   guaiFENesin (MUCINEX) 600 MG 12 hr tablet Take 1 tablet (600 mg total) by mouth 2 (two) times daily. 30 tablet 0   Incontinence Supply Disposable (POISE PANTILINERS) PADS 1 each by Does not apply route as needed. 150 each 0   levothyroxine (SYNTHROID) 100 MCG tablet Take 100 mcg by mouth daily.     levothyroxine (SYNTHROID) 88 MCG tablet Take 1 tablet (88 mcg total) by mouth daily before breakfast. 90 tablet 1   metFORMIN (GLUCOPHAGE) 500 MG tablet Take 500 mg by mouth 2 (two) times daily with a meal. Extended release     naloxone (NARCAN) nasal spray 4 mg/0.1 mL SMARTSIG:1 Both Nares Daily     ondansetron (ZOFRAN-ODT) 4 MG disintegrating tablet Take 4 mg by mouth 2 (two) times daily.     pantoprazole (PROTONIX) 40 MG tablet Take 40 mg by mouth daily.     potassium chloride (K-DUR) 10 MEQ tablet Take 1 tablet (10 mEq total) by mouth daily. 90 tablet 3    rosuvastatin (CRESTOR) 20 MG tablet Take 1 tablet (20 mg total) by mouth daily. 90 tablet 3   sertraline (ZOLOFT) 50 MG tablet Take 50 mg by mouth daily.     traMADol (ULTRAM) 50 MG tablet Take 1-2 tablets (50-100 mg total) by mouth every 6 (six) hours as needed for moderate pain. 15 tablet 0   No current facility-administered medications for this visit.    ALLERGIES:  Allergies  Allergen Reactions   Cat Hair Extract Cough    sneezing   Other Cough    Coughing and sneezing d/t some cats, dogs and perfumes    PHYSICAL EXAM:  Performance status (ECOG): 1 - Symptomatic but completely ambulatory  Vitals:   02/01/22 1411  BP: 95/71  Pulse: (!) 103  Resp: 19  Temp: 98.6 F (37 C)  SpO2: 93%   Wt Readings from Last 3 Encounters:  02/01/22 120 lb (54.4 kg)  01/25/22 117 lb (53.1 kg)  12/23/21 126 lb (57.2 kg)   Physical Exam Vitals reviewed.  Constitutional:      Appearance: Normal appearance.  Cardiovascular:     Rate and Rhythm: Normal rate and regular rhythm.     Pulses: Normal pulses.     Heart sounds: Normal heart sounds.  Pulmonary:     Effort: Pulmonary effort is normal.     Breath sounds: Normal breath sounds.  Neurological:     General: No focal deficit present.     Mental Status: She is alert and oriented to person, place, and time.  Psychiatric:        Mood and Affect: Mood normal.        Behavior: Behavior normal.      LABORATORY DATA:  I have reviewed the labs as listed.     Latest Ref Rng & Units 12/23/2021    9:47 AM 10/28/2021  2:02 PM 08/22/2017    2:15 PM  CBC  WBC 4.0 - 10.5 K/uL 8.0  8.6  9.4   Hemoglobin 12.0 - 15.0 g/dL 14.4  14.5  14.1   Hematocrit 36.0 - 46.0 % 43.7  45.8  42.2   Platelets 150 - 400 K/uL 263  305  228       Latest Ref Rng & Units 10/28/2021    2:02 PM 09/24/2021    4:43 AM 08/19/2020   12:00 AM  CMP  Glucose 70 - 99 mg/dL 104  128    BUN 8 - 23 mg/dL $Remove'10  22  10      'jiLAYcz$ Creatinine 0.44 - 1.00 mg/dL 1.13  1.49  1.1       Sodium 135 - 145 mmol/L 141  137    Potassium 3.5 - 5.1 mmol/L 3.9  4.8    Chloride 98 - 111 mmol/L 103  101    CO2 22 - 32 mmol/L 30  29    Calcium 8.9 - 10.3 mg/dL 9.1  9.0  9.2      Total Protein 6.5 - 8.1 g/dL 7.2     Total Bilirubin 0.3 - 1.2 mg/dL 0.3     Alkaline Phos 38 - 126 U/L 89     AST 15 - 41 U/L 17     ALT 0 - 44 U/L 23        This result is from an external source.     DIAGNOSTIC IMAGING:  I have independently reviewed the scans and discussed with the patient.    ASSESSMENT:  Stage IV ([id number]) medullary thyroid cancer: - US thyroid (02/01/2021): Solitary right superior thyroid nodule, 3.9 cm. - FNA (03/17/2021) malignant cells present. - Total thyroidectomy and central compartment lymph node dissection (09/23/2021) by Dr. Harlow Asa. - Pathology: Medullary thyroid carcinoma with tumor confined to the right lobe measuring 4.5 x 3.4 x 3.5 cm.  Margins are free.  5/8 lymph nodes with metastatic medullary carcinoma.  PT3APN1A.  Angioinvasion/lymphatic invasion/extrathyroidal extension: Not identified.  Margins are negative. - Reports 10 pound weight loss in the last 3 months due to decreased appetite.  Weight is stable in the last few months.       -Sternal biopsy (12/23/2021): Metastatic medullary carcinoma       -Bone scan 12/31/2021: Metastasis in the sternum, C3, C7 vertebral bodies, transverse process of T4 vertebral body.  Linear focus of uptake in the posterior right first or second rib. - [facility] NGS: Negative for RET.  BRAF pathogenic fusion without mutation.  MSI-stable.  TMB-low.  PD-L1-negative.  No other targetable mutations.    Social/family history: - She lives by herself at home.  She has an aide during the daytime 5 days a week.  She is able to do all her ADLs and IADLs.  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  She is a current active smoker, 1 pack/day for the last 53 years. - Maternal grandfather had?  Testicular cancer.   Sister had thyroid cancer.   PLAN:  Stage IV ([id number]) medullary thyroid cancer: - I have reviewed the pathology report from sternal biopsy consistent with metastatic disease. - We will obtain the genetic testing results for RET mutations. - I have reviewed the images of the bone scan with the patient. - Currently her only symptom is pain in the sternal area from the metastatic disease.  I have recommended radiation oncology evaluation for palliative treatment for pain. -  We talked about further management of her metastatic medullary thyroid cancer with targeted treatments like vandetanib/cabozantinib.  Treatment is generally indicated if there is any progressive worsening of metastatic disease or severe symptoms. - If her sternal pain is not controlled well with radiation, will consider starting her on TKI's.  I plan to repeat scans prior to next visit in 6 weeks with CT CAP with contrast and a bone scan. - We will also check CEA, calcitonin and routine labs.  2.  Thyroxine therapy: - Management per Dr. Dorris Fetch.         3.  Difficulty sleeping:        -She is unable to sleep from anxiety from her cancer.  We will start her on Ambien 5 mg at bedtime.   Orders placed this encounter:  Orders Placed This Encounter  Procedures   CT CHEST ABDOMEN PELVIS W CONTRAST   NM Bone Scan Whole Body   CBC with Differential   Comprehensive metabolic panel   CEA   Calcitonin      Derek Jack, MD [place] [phone]

## 2022-02-01 NOTE — Patient Instructions (Signed)
[facility]  Discharge Instructions  You were seen and examined today by Dr. Delton Coombes.  Dr. Delton Coombes has reviewed your recent biopsy and scans which revealed that the cancer from your thyroid has spread to the bone. This means that it is Stage IV Medullary Thyroid Cancer.  Dr. Delton Coombes has reviewed the Next Generation Sequencing test - there are no targetable mutations found on your testing.  Dr. Delton Coombes has recommended treatment only the presence of symptoms or progression. Symptoms include worsening of the cancer on the scan, worsening pain/fatigue/weight loss, and worsening abnormalities on your lab work.  Dr. Delton Coombes discussed the role of Radiation therapy to help control the pain in your breast bone. This could decrease the size of the cancer and help to control the pain level. Radiation is done at [facility] in [place] or [facility] in [place]. If the Radiation Oncologist is agreeable to treating you with radiation, please proceed with that. If radiation is not an option, Dr. Delton Coombes would then discuss next steps in the form of systemic treatment with pills.  Follow-up as scheduled.  Thank you for choosing [facility] to provide your oncology and hematology care.   To afford each patient quality time with our provider, please arrive at least 15 minutes before your scheduled appointment time. You may need to reschedule your appointment if you arrive late (10 or more minutes). Arriving late affects you and other patients whose appointments are after yours.  Also, if you miss three or more appointments without notifying the office, you may be dismissed from the clinic at the provider's discretion.    Again, thank you for choosing [facility].  Our hope is that these requests will decrease the amount of time that you wait before being seen by our physicians.   If you have a lab  appointment with the [facility] please come in thru the Main Entrance and check in at the main information desk.           _____________________________________________________________  Should you have questions after your visit to [facility], please contact our office at [phone] and follow the prompts.  Our office hours are 8:00 a.m. to 4:30 p.m. Monday - Thursday and 8:00 a.m. to 2:30 p.m. Friday.  Please note that voicemails left after 4:00 p.m. may not be returned until the following business day.  We are closed weekends and all major holidays.  You do have access to a nurse 24-7, just call the main number to the clinic [phone] and do not press any options, hold on the line and a nurse will answer the phone.    For prescription refill requests, have your pharmacy contact our office and allow 72 hours.    Masks are optional in the cancer centers. If you would like for your care team to wear a mask while they are taking care of you, please let them know. You may have one support person who is at least 70 years old accompany you for your appointments.

## 2022-02-02 ENCOUNTER — Encounter: Payer: Self-pay | Admitting: Licensed Clinical Social Worker

## 2022-02-02 MED ORDER — ZOLPIDEM TARTRATE 5 MG PO TABS
5.0000 mg | ORAL_TABLET | Freq: Every evening | ORAL | 0 refills | Status: DC | PRN
Start: 2022-02-02 — End: 2022-04-14

## 2022-02-03 DIAGNOSIS — C73 Malignant neoplasm of thyroid gland: Secondary | ICD-10-CM | POA: Diagnosis not present

## 2022-02-03 DIAGNOSIS — J449 Chronic obstructive pulmonary disease, unspecified: Secondary | ICD-10-CM | POA: Diagnosis not present

## 2022-02-03 DIAGNOSIS — M47897 Other spondylosis, lumbosacral region: Secondary | ICD-10-CM | POA: Diagnosis not present

## 2022-02-03 DIAGNOSIS — C7951 Secondary malignant neoplasm of bone: Secondary | ICD-10-CM | POA: Diagnosis not present

## 2022-02-03 DIAGNOSIS — J9690 Respiratory failure, unspecified, unspecified whether with hypoxia or hypercapnia: Secondary | ICD-10-CM | POA: Diagnosis not present

## 2022-02-04 DIAGNOSIS — C7951 Secondary malignant neoplasm of bone: Secondary | ICD-10-CM | POA: Diagnosis not present

## 2022-02-04 DIAGNOSIS — C73 Malignant neoplasm of thyroid gland: Secondary | ICD-10-CM | POA: Diagnosis not present

## 2022-02-07 ENCOUNTER — Encounter: Payer: Medicare Other | Admitting: Dietician

## 2022-02-07 ENCOUNTER — Other Ambulatory Visit: Payer: Self-pay

## 2022-02-07 DIAGNOSIS — C7951 Secondary malignant neoplasm of bone: Secondary | ICD-10-CM | POA: Diagnosis not present

## 2022-02-07 DIAGNOSIS — C73 Malignant neoplasm of thyroid gland: Secondary | ICD-10-CM

## 2022-02-07 DIAGNOSIS — Z510 Encounter for antineoplastic radiation therapy: Secondary | ICD-10-CM | POA: Diagnosis not present

## 2022-02-07 NOTE — Progress Notes (Signed)
Message received from -Derek Jack, MD  Candis Musa, LPN; Sherilyn Banker, Amy J Please order MRI of the thoracic spine with and without contrast and let the patient know about the schedule.  This was based on me talking to Dr. Lynnette Caffey in [place].  Reason for MRI: Questionable mass in the spinal canal at T4 level.  Thank you.   Orders placed per Dr. Tomie China order.

## 2022-02-10 DIAGNOSIS — Z510 Encounter for antineoplastic radiation therapy: Secondary | ICD-10-CM | POA: Diagnosis not present

## 2022-02-10 DIAGNOSIS — C7951 Secondary malignant neoplasm of bone: Secondary | ICD-10-CM | POA: Diagnosis not present

## 2022-02-10 DIAGNOSIS — C73 Malignant neoplasm of thyroid gland: Secondary | ICD-10-CM | POA: Diagnosis not present

## 2022-02-11 DIAGNOSIS — C73 Malignant neoplasm of thyroid gland: Secondary | ICD-10-CM | POA: Diagnosis not present

## 2022-02-11 DIAGNOSIS — Z510 Encounter for antineoplastic radiation therapy: Secondary | ICD-10-CM | POA: Diagnosis not present

## 2022-02-11 DIAGNOSIS — C7951 Secondary malignant neoplasm of bone: Secondary | ICD-10-CM | POA: Diagnosis not present

## 2022-02-14 ENCOUNTER — Inpatient Hospital Stay: Payer: Medicare Other | Admitting: Dietician

## 2022-02-14 DIAGNOSIS — Z510 Encounter for antineoplastic radiation therapy: Secondary | ICD-10-CM | POA: Diagnosis not present

## 2022-02-14 DIAGNOSIS — C73 Malignant neoplasm of thyroid gland: Secondary | ICD-10-CM | POA: Diagnosis not present

## 2022-02-14 DIAGNOSIS — C7951 Secondary malignant neoplasm of bone: Secondary | ICD-10-CM | POA: Diagnosis not present

## 2022-02-15 DIAGNOSIS — C73 Malignant neoplasm of thyroid gland: Secondary | ICD-10-CM | POA: Diagnosis not present

## 2022-02-15 DIAGNOSIS — C7951 Secondary malignant neoplasm of bone: Secondary | ICD-10-CM | POA: Diagnosis not present

## 2022-02-15 DIAGNOSIS — Z510 Encounter for antineoplastic radiation therapy: Secondary | ICD-10-CM | POA: Diagnosis not present

## 2022-02-16 ENCOUNTER — Ambulatory Visit (HOSPITAL_COMMUNITY): Payer: Medicare Other

## 2022-02-16 DIAGNOSIS — C73 Malignant neoplasm of thyroid gland: Secondary | ICD-10-CM | POA: Diagnosis not present

## 2022-02-16 DIAGNOSIS — Z510 Encounter for antineoplastic radiation therapy: Secondary | ICD-10-CM | POA: Diagnosis not present

## 2022-02-16 DIAGNOSIS — C7951 Secondary malignant neoplasm of bone: Secondary | ICD-10-CM | POA: Diagnosis not present

## 2022-02-17 DIAGNOSIS — Z510 Encounter for antineoplastic radiation therapy: Secondary | ICD-10-CM | POA: Diagnosis not present

## 2022-02-17 DIAGNOSIS — C7951 Secondary malignant neoplasm of bone: Secondary | ICD-10-CM | POA: Diagnosis not present

## 2022-02-17 DIAGNOSIS — C73 Malignant neoplasm of thyroid gland: Secondary | ICD-10-CM | POA: Diagnosis not present

## 2022-02-18 DIAGNOSIS — G894 Chronic pain syndrome: Secondary | ICD-10-CM | POA: Diagnosis not present

## 2022-02-18 DIAGNOSIS — C7951 Secondary malignant neoplasm of bone: Secondary | ICD-10-CM | POA: Diagnosis not present

## 2022-02-18 DIAGNOSIS — C73 Malignant neoplasm of thyroid gland: Secondary | ICD-10-CM | POA: Diagnosis not present

## 2022-02-18 DIAGNOSIS — Z510 Encounter for antineoplastic radiation therapy: Secondary | ICD-10-CM | POA: Diagnosis not present

## 2022-02-18 DIAGNOSIS — J449 Chronic obstructive pulmonary disease, unspecified: Secondary | ICD-10-CM | POA: Diagnosis not present

## 2022-02-18 DIAGNOSIS — J441 Chronic obstructive pulmonary disease with (acute) exacerbation: Secondary | ICD-10-CM | POA: Diagnosis not present

## 2022-02-21 ENCOUNTER — Inpatient Hospital Stay: Payer: Medicare Other | Attending: Hematology | Admitting: Dietician

## 2022-02-21 DIAGNOSIS — Z510 Encounter for antineoplastic radiation therapy: Secondary | ICD-10-CM | POA: Diagnosis not present

## 2022-02-21 DIAGNOSIS — C73 Malignant neoplasm of thyroid gland: Secondary | ICD-10-CM | POA: Diagnosis not present

## 2022-02-21 DIAGNOSIS — C7951 Secondary malignant neoplasm of bone: Secondary | ICD-10-CM | POA: Diagnosis not present

## 2022-02-21 NOTE — Progress Notes (Signed)
Patient did not show for nutrition appointment. This is the second appointment patient has missed.

## 2022-02-22 DIAGNOSIS — C7951 Secondary malignant neoplasm of bone: Secondary | ICD-10-CM | POA: Diagnosis not present

## 2022-02-22 DIAGNOSIS — C73 Malignant neoplasm of thyroid gland: Secondary | ICD-10-CM | POA: Diagnosis not present

## 2022-02-22 DIAGNOSIS — Z510 Encounter for antineoplastic radiation therapy: Secondary | ICD-10-CM | POA: Diagnosis not present

## 2022-02-23 DIAGNOSIS — C73 Malignant neoplasm of thyroid gland: Secondary | ICD-10-CM | POA: Diagnosis not present

## 2022-02-23 DIAGNOSIS — Z510 Encounter for antineoplastic radiation therapy: Secondary | ICD-10-CM | POA: Diagnosis not present

## 2022-02-23 DIAGNOSIS — C7951 Secondary malignant neoplasm of bone: Secondary | ICD-10-CM | POA: Diagnosis not present

## 2022-03-05 DIAGNOSIS — J449 Chronic obstructive pulmonary disease, unspecified: Secondary | ICD-10-CM | POA: Diagnosis not present

## 2022-03-05 DIAGNOSIS — J9690 Respiratory failure, unspecified, unspecified whether with hypoxia or hypercapnia: Secondary | ICD-10-CM | POA: Diagnosis not present

## 2022-03-05 DIAGNOSIS — M47897 Other spondylosis, lumbosacral region: Secondary | ICD-10-CM | POA: Diagnosis not present

## 2022-03-09 ENCOUNTER — Other Ambulatory Visit: Payer: Medicare Other

## 2022-03-09 ENCOUNTER — Encounter (HOSPITAL_COMMUNITY): Payer: Medicare Other

## 2022-03-15 ENCOUNTER — Ambulatory Visit: Payer: Medicare Other | Admitting: Hematology

## 2022-03-15 ENCOUNTER — Ambulatory Visit (HOSPITAL_COMMUNITY)
Admission: RE | Admit: 2022-03-15 | Discharge: 2022-03-15 | Disposition: A | Discharge status: Home / Self Care | Payer: Medicare Other | Source: Ambulatory Visit | Attending: Hematology | Admitting: Hematology

## 2022-03-15 ENCOUNTER — Inpatient Hospital Stay: Payer: Medicare Other | Attending: Hematology

## 2022-03-15 DIAGNOSIS — C7951 Secondary malignant neoplasm of bone: Secondary | ICD-10-CM | POA: Diagnosis not present

## 2022-03-15 DIAGNOSIS — G893 Neoplasm related pain (acute) (chronic): Secondary | ICD-10-CM | POA: Diagnosis not present

## 2022-03-15 DIAGNOSIS — C73 Malignant neoplasm of thyroid gland: Secondary | ICD-10-CM | POA: Insufficient documentation

## 2022-03-15 DIAGNOSIS — N2889 Other specified disorders of kidney and ureter: Secondary | ICD-10-CM | POA: Diagnosis not present

## 2022-03-15 DIAGNOSIS — R918 Other nonspecific abnormal finding of lung field: Secondary | ICD-10-CM | POA: Diagnosis not present

## 2022-03-15 LAB — COMPREHENSIVE METABOLIC PANEL
ALT: 17 U/L (ref 0–44)
AST: 26 U/L (ref 15–41)
Albumin: 3.2 g/dL — ABNORMAL LOW (ref 3.5–5.0)
Alkaline Phosphatase: 82 U/L (ref 38–126)
Anion gap: 14 (ref 5–15)
BUN: 17 mg/dL (ref 8–23)
CO2: 28 mmol/L (ref 22–32)
Calcium: 9.5 mg/dL (ref 8.9–10.3)
Chloride: 96 mmol/L — ABNORMAL LOW (ref 98–111)
Creatinine, Ser: 0.91 mg/dL (ref 0.44–1.00)
GFR, Estimated: >60 mL/min (ref >60)
Glucose, Bld: 145 mg/dL — ABNORMAL HIGH (ref 70–99)
Potassium: 3.8 mmol/L (ref 3.5–5.1)
Sodium: 138 mmol/L (ref 135–145)
Total Bilirubin: 0.7 mg/dL (ref 0.3–1.2)
Total Protein: 7.2 g/dL (ref 6.5–8.1)

## 2022-03-15 LAB — CBC WITH DIFFERENTIAL/PLATELET
Abs Immature Granulocytes: 0.08 10*3/uL — ABNORMAL HIGH (ref 0.00–0.07)
Basophils Absolute: 0 10*3/uL (ref 0.0–0.1)
Basophils Relative: 0 %
Eosinophils Absolute: 0.1 10*3/uL (ref 0.0–0.5)
Eosinophils Relative: 2 %
HCT: 40.3 % (ref 36.0–46.0)
Hemoglobin: 13.2 g/dL (ref 12.0–15.0)
Immature Granulocytes: 1 %
Lymphocytes Relative: 10 %
Lymphs Abs: 0.8 10*3/uL (ref 0.7–4.0)
MCH: 26.6 pg (ref 26.0–34.0)
MCHC: 32.8 g/dL (ref 30.0–36.0)
MCV: 81.3 fL (ref 80.0–100.0)
Monocytes Absolute: 0.6 10*3/uL (ref 0.1–1.0)
Monocytes Relative: 6 %
Neutro Abs: 7 10*3/uL (ref 1.7–7.7)
Neutrophils Relative %: 81 %
Platelets: 276 10*3/uL (ref 150–400)
RBC: 4.96 MIL/uL (ref 3.87–5.11)
RDW: 15.2 % (ref 11.5–15.5)
WBC: 8.6 10*3/uL (ref 4.0–10.5)
nRBC: 0.2 % (ref 0.0–0.2)

## 2022-03-15 MED ORDER — IOHEXOL 300 MG/ML  SOLN
100.0000 mL | Freq: Once | INTRAMUSCULAR | Status: AC | PRN
  Administered 2022-03-15: 100 mL via INTRAVENOUS

## 2022-03-16 LAB — CALCITONIN: Calcitonin: 980 pg/mL — ABNORMAL HIGH (ref 0.0–5.0)

## 2022-03-18 LAB — CEA: CEA: 1721 ng/mL — ABNORMAL HIGH (ref 0.0–4.7)

## 2022-03-21 ENCOUNTER — Inpatient Hospital Stay: Payer: Medicare Other | Admitting: Hematology

## 2022-03-25 ENCOUNTER — Telehealth: Payer: Self-pay | Admitting: *Deleted

## 2022-03-25 ENCOUNTER — Telehealth: Payer: Self-pay

## 2022-03-25 DIAGNOSIS — J441 Chronic obstructive pulmonary disease with (acute) exacerbation: Secondary | ICD-10-CM

## 2022-03-25 DIAGNOSIS — E119 Type 2 diabetes mellitus without complications: Secondary | ICD-10-CM

## 2022-03-25 NOTE — Patient Outreach (Signed)
Received a referral from Dr. Delphina Cahill for Ms. Buysse. I have sent a referral to the [facility] [place] team.    [facility], [place], [place] [facility] [phone]

## 2022-03-25 NOTE — Progress Notes (Unsigned)
  Care Coordination  Outreach Note  03/25/2022 Name: Kristin Wells MRN: [id number] DOB: 02-14-1951   Care Coordination Outreach Attempts: An unsuccessful telephone outreach was attempted today to offer the patient information about available care coordination services as a benefit of their health plan.   Referral received   Follow Up Plan:  Additional outreach attempts will be made to offer the patient care coordination information and services.   Encounter Outcome:  No Answer  Sig Julian Hy, [place] Direct Dial: [phone]

## 2022-03-28 ENCOUNTER — Inpatient Hospital Stay: Payer: Medicare Other | Admitting: Hematology

## 2022-03-29 ENCOUNTER — Ambulatory Visit: Payer: Self-pay | Admitting: Licensed Clinical Social Worker

## 2022-03-29 NOTE — Patient Outreach (Signed)
Care Coordination   Initial Visit Note   03/29/2022 Name: Kristin Wells MRN: [id number] DOB: May 01, 1951  Kristin Wells is a 71 y.o. year old female who sees Nevada Crane, Edwinna Areola, MD for primary care. I spoke with  Kristin Wells by phone today.  What matters to the patients health and wellness today? Client wants to live in current residence and be as independent as possible with ADLs and daily activities    Goals Addressed               This Visit's Progress     patient wants to reside at apartment and to be as independent as possible. (pt-stated)        Interventions: Talked with Earlie Server about Care Coordination program support Encouraged Starlyn to talk with PCP about program support. Discussed insurance of client. She has [place] and Medicaid. Discussed fall risk of client. Client said she has fallen 5 times in past month. She said her legs feel weak and legs give way some time.She has problem getting back up and down. Uses walker. She has pain in her knees.  She uses elevator frequently in building.   Discussed Transport needs. She said she uses [employer] with lift on van to help her go to and from medical appointments. Reviewed medications procurement of client.   Reviewed vision of client. She said she has vision decrease. Client said she has difficulty standing up.   Client eats sandwiches often. Uses microwave to warm up food.  Discussed family support. She said she has limited family support Provided counseling support for client. Discussed relaxation techniques with client Client and LCSW spoke of her cancer treatments. She spoke of cancer treatments and was very fatigued after cancer treatments. She said she has had 10 cancer treatments.  Discussed her feelings about managing cancer treatments. She said she thinks she is scheduled for a type of scan soon .  Discussed art therapy.  She likes to do art drawing if able and has done art drawing in the past.  She tries to take naps as  part of relaxation approach.   Discussed finances of client.  Client said she has Representative Payee that helps her manage client finances. Client has difficulty getting up and getting to door at her apartment. It takes her a little while to get to her door.   Talked with client about ADLs completion.  She takes sink bath regularly to help with bathing Client spoke of her cancer treatments (10 treatments received).   Reviewed sleeping challenges. She has sleeping difficulty Talked of her next appointment at office of PCP on April 11 2022. Client said she would like RN with Care Coordination program to call her to discuss her nursing needs.  Discussed dental needs of client. She eats soft foods. She has difficulty paying copays for medical or dental visits. LCSW to schedule RN  phone visit with client LCSW  Theadore Nan to schedule client phone visit with Nat Christen who is designated LCSW for client going forward      SDOH assessments and interventions completed:  Yes  SDOH Interventions Today    Flowsheet Row Most Recent Value  SDOH Interventions   Depression Interventions/Treatment  Medication, Counseling   Anxiety:  Client can use relaxation techniques to help her manage anxiety. She likes to draw, art sketches. She likes to get in quiet area and take nap     Care Coordination Interventions Activated:  Yes  Care Coordination Interventions:  Yes,  provided   Follow up plan: Follow up call scheduled for LCSW Nat Christen and client for April 13, 2022 at 2:00 PM     Encounter Outcome:  Pt. Visit Completed   Norva Riffle.Ota Ebersole MSW, [place] representative [facility] Care Management [phone]

## 2022-03-29 NOTE — Progress Notes (Signed)
  Care Coordination   Note   03/29/2022 Name: Kristin Wells MRN: [id number] DOB: 08-30-1950  Kristin Wells is a 71 y.o. year old female who sees Nevada Crane, Edwinna Areola, MD for primary care. I reached out to Kristin Wells by phone today to offer care coordination services.  [name] was given information about Care Coordination services today including:   The Care Coordination services include support from the care team which includes your Nurse Coordinator, Clinical Social Worker, or Pharmacist.  The Care Coordination team is here to help remove barriers to the health concerns and goals most important to you. Care Coordination services are voluntary, and the patient may decline or stop services at any time by request to their care team member.   Care Coordination Consent Status: Patient agreed to services and verbal consent obtained.   Follow up plan:  Telephone appointment with care coordination team member scheduled for:  03/29/22  Encounter Outcome:  Pt. Scheduled  [place]  Direct Dial: [phone]

## 2022-03-29 NOTE — Patient Instructions (Signed)
Visit Information  Thank you for taking time to visit with me today. Please don't hesitate to contact me if I can be of assistance to you before our next scheduled telephone appointment.  Following are the goals we discussed today:   Next appointment  of client with LCSW Kristin Wells is is by telephone on 04/13/22 at 2:00 PM   Please call the care guide team at [phone] if you need to cancel or reschedule your appointment.   If you are experiencing a Mental Health or [place] or need someone to talk to, please call the [facility]: [phone]   Following is a copy of your full plan of care:   Interventions: Talked with Kristin Wells about Care Coordination program support Encouraged Kristin Wells to talk with PCP about program support. Discussed insurance of client. She has [place] and Medicaid. Discussed fall risk of client. Client said she has fallen 5 times in past month. She said her legs feel weak and legs give way some time.She has problem getting back up and down. Uses walker. She has pain in her knees.  She uses elevator frequently in building.   Discussed Transport needs. She said she uses [facility] with lift on van to help her go to and from medical appointments. Reviewed medications procurement of client.   Reviewed vision of client. She said she has vision decrease. Client said she has difficulty standing up.   Client eats sandwiches often. Uses microwave to warm up food.  Discussed family support. She said she has limited family support Provided counseling support for client. Discussed relaxation techniques with client Client and LCSW spoke of her cancer treatments. She spoke of cancer treatments and was very fatigued after cancer treatments. She said she has had 10 cancer treatments.  Discussed her feelings about managing cancer treatments. She said she thinks she is scheduled for a type of scan soon .  Discussed art therapy.  She likes to do art  drawing if able and has done art drawing in the past.  She tries to take naps as part of relaxation approach.   Discussed finances of client.  Client said she has Representative Payee that helps her manage client finances. Client has difficulty getting up and getting to door at her apartment. It takes her a little while to get to her door.   Talked with client about ADLs completion.  She takes sink bath regularly to help with bathing Client spoke of her cancer treatments (10 treatments received).   Reviewed sleeping challenges. She has sleeping difficulty Talked of her next appointment at office of PCP on April 11 2022. Client said she would like RN with Care Coordination program to call her to discuss her nursing needs.  Discussed dental needs of client. She eats soft foods. She has difficulty paying copays for medical or dental visits. LCSW to schedule RN  phone visit with client LCSW  Theadore Nan to schedule client phone visit with Kristin Wells who is designated LCSW for client going forward  [name] was given information about Care Management services by the embedded care coordination team including:  Care Management services include personalized support from designated clinical staff supervised by her physician, including individualized plan of care and coordination with other care providers 24/7 contact phone numbers for assistance for urgent and routine care needs. The patient may stop CCM services at any time (effective at the end of the month) by phone call to the office staff.  Patient agreed to  services and verbal consent obtained.   Norva Riffle.Kristin Wells MSW, [place] representative [facility] Care Management [phone]

## 2022-03-30 ENCOUNTER — Telehealth: Payer: Self-pay | Admitting: "Endocrinology

## 2022-03-30 DIAGNOSIS — C7949 Secondary malignant neoplasm of other parts of nervous system: Secondary | ICD-10-CM | POA: Diagnosis not present

## 2022-03-30 DIAGNOSIS — F17210 Nicotine dependence, cigarettes, uncomplicated: Secondary | ICD-10-CM | POA: Diagnosis not present

## 2022-03-30 DIAGNOSIS — Z510 Encounter for antineoplastic radiation therapy: Secondary | ICD-10-CM | POA: Diagnosis not present

## 2022-03-30 DIAGNOSIS — Z7984 Long term (current) use of oral hypoglycemic drugs: Secondary | ICD-10-CM | POA: Diagnosis not present

## 2022-03-30 DIAGNOSIS — C7951 Secondary malignant neoplasm of bone: Secondary | ICD-10-CM | POA: Diagnosis not present

## 2022-03-30 DIAGNOSIS — I509 Heart failure, unspecified: Secondary | ICD-10-CM | POA: Diagnosis not present

## 2022-03-30 DIAGNOSIS — Z043 Encounter for examination and observation following other accident: Secondary | ICD-10-CM | POA: Diagnosis not present

## 2022-03-30 DIAGNOSIS — I2510 Atherosclerotic heart disease of native coronary artery without angina pectoris: Secondary | ICD-10-CM | POA: Diagnosis not present

## 2022-03-30 DIAGNOSIS — I252 Old myocardial infarction: Secondary | ICD-10-CM | POA: Diagnosis not present

## 2022-03-30 DIAGNOSIS — C73 Malignant neoplasm of thyroid gland: Secondary | ICD-10-CM | POA: Diagnosis not present

## 2022-03-30 DIAGNOSIS — R0689 Other abnormalities of breathing: Secondary | ICD-10-CM | POA: Diagnosis not present

## 2022-03-30 DIAGNOSIS — Z743 Need for continuous supervision: Secondary | ICD-10-CM | POA: Diagnosis not present

## 2022-03-30 DIAGNOSIS — R6889 Other general symptoms and signs: Secondary | ICD-10-CM | POA: Diagnosis not present

## 2022-03-30 DIAGNOSIS — C719 Malignant neoplasm of brain, unspecified: Secondary | ICD-10-CM | POA: Diagnosis not present

## 2022-03-30 DIAGNOSIS — T699XXA Effect of reduced temperature, unspecified, initial encounter: Secondary | ICD-10-CM | POA: Diagnosis not present

## 2022-03-30 DIAGNOSIS — I110 Hypertensive heart disease with heart failure: Secondary | ICD-10-CM | POA: Diagnosis not present

## 2022-03-30 DIAGNOSIS — R531 Weakness: Secondary | ICD-10-CM | POA: Diagnosis not present

## 2022-03-30 DIAGNOSIS — E119 Type 2 diabetes mellitus without complications: Secondary | ICD-10-CM | POA: Diagnosis not present

## 2022-03-30 DIAGNOSIS — C799 Secondary malignant neoplasm of unspecified site: Secondary | ICD-10-CM | POA: Diagnosis not present

## 2022-03-30 DIAGNOSIS — J449 Chronic obstructive pulmonary disease, unspecified: Secondary | ICD-10-CM | POA: Diagnosis not present

## 2022-03-30 NOTE — Telephone Encounter (Signed)
Please see office notes under media from [facility].

## 2022-03-31 DIAGNOSIS — Z85850 Personal history of malignant neoplasm of thyroid: Secondary | ICD-10-CM | POA: Diagnosis not present

## 2022-03-31 DIAGNOSIS — R0682 Tachypnea, not elsewhere classified: Secondary | ICD-10-CM | POA: Diagnosis not present

## 2022-03-31 DIAGNOSIS — E119 Type 2 diabetes mellitus without complications: Secondary | ICD-10-CM | POA: Diagnosis not present

## 2022-03-31 DIAGNOSIS — T380X5A Adverse effect of glucocorticoids and synthetic analogues, initial encounter: Secondary | ICD-10-CM | POA: Diagnosis not present

## 2022-03-31 DIAGNOSIS — I2510 Atherosclerotic heart disease of native coronary artery without angina pectoris: Secondary | ICD-10-CM | POA: Diagnosis not present

## 2022-03-31 DIAGNOSIS — C7931 Secondary malignant neoplasm of brain: Secondary | ICD-10-CM | POA: Diagnosis not present

## 2022-03-31 DIAGNOSIS — Z79899 Other long term (current) drug therapy: Secondary | ICD-10-CM | POA: Diagnosis not present

## 2022-03-31 DIAGNOSIS — F32A Depression, unspecified: Secondary | ICD-10-CM | POA: Diagnosis not present

## 2022-03-31 DIAGNOSIS — J449 Chronic obstructive pulmonary disease, unspecified: Secondary | ICD-10-CM | POA: Diagnosis not present

## 2022-03-31 DIAGNOSIS — F17200 Nicotine dependence, unspecified, uncomplicated: Secondary | ICD-10-CM | POA: Diagnosis not present

## 2022-03-31 DIAGNOSIS — I5022 Chronic systolic (congestive) heart failure: Secondary | ICD-10-CM | POA: Diagnosis not present

## 2022-03-31 DIAGNOSIS — I5030 Unspecified diastolic (congestive) heart failure: Secondary | ICD-10-CM | POA: Diagnosis not present

## 2022-03-31 DIAGNOSIS — R0602 Shortness of breath: Secondary | ICD-10-CM | POA: Diagnosis not present

## 2022-03-31 DIAGNOSIS — J811 Chronic pulmonary edema: Secondary | ICD-10-CM | POA: Diagnosis not present

## 2022-03-31 DIAGNOSIS — D492 Neoplasm of unspecified behavior of bone, soft tissue, and skin: Secondary | ICD-10-CM | POA: Insufficient documentation

## 2022-03-31 DIAGNOSIS — R531 Weakness: Secondary | ICD-10-CM | POA: Diagnosis not present

## 2022-03-31 DIAGNOSIS — R000 Tachycardia, unspecified: Secondary | ICD-10-CM | POA: Diagnosis not present

## 2022-03-31 DIAGNOSIS — R296 Repeated falls: Secondary | ICD-10-CM | POA: Diagnosis not present

## 2022-03-31 DIAGNOSIS — Z7984 Long term (current) use of oral hypoglycemic drugs: Secondary | ICD-10-CM | POA: Diagnosis not present

## 2022-03-31 DIAGNOSIS — C7951 Secondary malignant neoplasm of bone: Secondary | ICD-10-CM | POA: Diagnosis not present

## 2022-03-31 DIAGNOSIS — J9690 Respiratory failure, unspecified, unspecified whether with hypoxia or hypercapnia: Secondary | ICD-10-CM | POA: Diagnosis not present

## 2022-03-31 DIAGNOSIS — I110 Hypertensive heart disease with heart failure: Secondary | ICD-10-CM | POA: Diagnosis not present

## 2022-03-31 DIAGNOSIS — E785 Hyperlipidemia, unspecified: Secondary | ICD-10-CM | POA: Diagnosis not present

## 2022-03-31 DIAGNOSIS — F17210 Nicotine dependence, cigarettes, uncomplicated: Secondary | ICD-10-CM | POA: Diagnosis not present

## 2022-03-31 DIAGNOSIS — C7949 Secondary malignant neoplasm of other parts of nervous system: Secondary | ICD-10-CM | POA: Diagnosis not present

## 2022-03-31 DIAGNOSIS — M4804 Spinal stenosis, thoracic region: Secondary | ICD-10-CM | POA: Diagnosis not present

## 2022-03-31 DIAGNOSIS — R627 Adult failure to thrive: Secondary | ICD-10-CM | POA: Diagnosis not present

## 2022-03-31 DIAGNOSIS — Z9981 Dependence on supplemental oxygen: Secondary | ICD-10-CM | POA: Diagnosis not present

## 2022-03-31 DIAGNOSIS — E890 Postprocedural hypothyroidism: Secondary | ICD-10-CM | POA: Diagnosis not present

## 2022-03-31 DIAGNOSIS — I5032 Chronic diastolic (congestive) heart failure: Secondary | ICD-10-CM | POA: Diagnosis not present

## 2022-03-31 DIAGNOSIS — R0902 Hypoxemia: Secondary | ICD-10-CM | POA: Diagnosis not present

## 2022-03-31 DIAGNOSIS — I959 Hypotension, unspecified: Secondary | ICD-10-CM | POA: Diagnosis not present

## 2022-03-31 DIAGNOSIS — C73 Malignant neoplasm of thyroid gland: Secondary | ICD-10-CM | POA: Diagnosis not present

## 2022-03-31 DIAGNOSIS — M47897 Other spondylosis, lumbosacral region: Secondary | ICD-10-CM | POA: Diagnosis not present

## 2022-03-31 DIAGNOSIS — J9621 Acute and chronic respiratory failure with hypoxia: Secondary | ICD-10-CM | POA: Diagnosis not present

## 2022-03-31 DIAGNOSIS — I252 Old myocardial infarction: Secondary | ICD-10-CM | POA: Diagnosis not present

## 2022-04-01 DIAGNOSIS — C7949 Secondary malignant neoplasm of other parts of nervous system: Secondary | ICD-10-CM | POA: Diagnosis not present

## 2022-04-01 DIAGNOSIS — I2510 Atherosclerotic heart disease of native coronary artery without angina pectoris: Secondary | ICD-10-CM | POA: Diagnosis not present

## 2022-04-01 DIAGNOSIS — R000 Tachycardia, unspecified: Secondary | ICD-10-CM | POA: Diagnosis not present

## 2022-04-01 DIAGNOSIS — R0602 Shortness of breath: Secondary | ICD-10-CM | POA: Diagnosis not present

## 2022-04-01 DIAGNOSIS — R0682 Tachypnea, not elsewhere classified: Secondary | ICD-10-CM | POA: Diagnosis not present

## 2022-04-01 DIAGNOSIS — J449 Chronic obstructive pulmonary disease, unspecified: Secondary | ICD-10-CM | POA: Diagnosis not present

## 2022-04-01 DIAGNOSIS — F32A Depression, unspecified: Secondary | ICD-10-CM | POA: Diagnosis not present

## 2022-04-01 DIAGNOSIS — C73 Malignant neoplasm of thyroid gland: Secondary | ICD-10-CM | POA: Diagnosis not present

## 2022-04-01 DIAGNOSIS — E785 Hyperlipidemia, unspecified: Secondary | ICD-10-CM | POA: Diagnosis not present

## 2022-04-01 DIAGNOSIS — E119 Type 2 diabetes mellitus without complications: Secondary | ICD-10-CM | POA: Diagnosis not present

## 2022-04-01 DIAGNOSIS — I959 Hypotension, unspecified: Secondary | ICD-10-CM | POA: Diagnosis not present

## 2022-04-01 DIAGNOSIS — C7951 Secondary malignant neoplasm of bone: Secondary | ICD-10-CM | POA: Diagnosis not present

## 2022-04-02 DIAGNOSIS — I5022 Chronic systolic (congestive) heart failure: Secondary | ICD-10-CM | POA: Diagnosis not present

## 2022-04-02 DIAGNOSIS — E785 Hyperlipidemia, unspecified: Secondary | ICD-10-CM | POA: Diagnosis not present

## 2022-04-02 DIAGNOSIS — I110 Hypertensive heart disease with heart failure: Secondary | ICD-10-CM | POA: Diagnosis not present

## 2022-04-02 DIAGNOSIS — J9621 Acute and chronic respiratory failure with hypoxia: Secondary | ICD-10-CM | POA: Diagnosis not present

## 2022-04-02 DIAGNOSIS — J449 Chronic obstructive pulmonary disease, unspecified: Secondary | ICD-10-CM | POA: Diagnosis not present

## 2022-04-02 DIAGNOSIS — I959 Hypotension, unspecified: Secondary | ICD-10-CM | POA: Diagnosis not present

## 2022-04-02 DIAGNOSIS — I5030 Unspecified diastolic (congestive) heart failure: Secondary | ICD-10-CM | POA: Diagnosis not present

## 2022-04-02 DIAGNOSIS — C7949 Secondary malignant neoplasm of other parts of nervous system: Secondary | ICD-10-CM | POA: Diagnosis not present

## 2022-04-02 DIAGNOSIS — J811 Chronic pulmonary edema: Secondary | ICD-10-CM | POA: Diagnosis not present

## 2022-04-02 DIAGNOSIS — F17200 Nicotine dependence, unspecified, uncomplicated: Secondary | ICD-10-CM | POA: Diagnosis not present

## 2022-04-02 DIAGNOSIS — I252 Old myocardial infarction: Secondary | ICD-10-CM | POA: Diagnosis not present

## 2022-04-02 DIAGNOSIS — C7951 Secondary malignant neoplasm of bone: Secondary | ICD-10-CM | POA: Diagnosis not present

## 2022-04-02 DIAGNOSIS — I2510 Atherosclerotic heart disease of native coronary artery without angina pectoris: Secondary | ICD-10-CM | POA: Diagnosis not present

## 2022-04-02 DIAGNOSIS — C73 Malignant neoplasm of thyroid gland: Secondary | ICD-10-CM | POA: Diagnosis not present

## 2022-04-03 DIAGNOSIS — J449 Chronic obstructive pulmonary disease, unspecified: Secondary | ICD-10-CM | POA: Diagnosis not present

## 2022-04-03 DIAGNOSIS — C73 Malignant neoplasm of thyroid gland: Secondary | ICD-10-CM | POA: Diagnosis not present

## 2022-04-03 DIAGNOSIS — R0602 Shortness of breath: Secondary | ICD-10-CM | POA: Diagnosis not present

## 2022-04-03 DIAGNOSIS — R0682 Tachypnea, not elsewhere classified: Secondary | ICD-10-CM | POA: Diagnosis not present

## 2022-04-03 DIAGNOSIS — I959 Hypotension, unspecified: Secondary | ICD-10-CM | POA: Diagnosis not present

## 2022-04-03 DIAGNOSIS — R0902 Hypoxemia: Secondary | ICD-10-CM | POA: Diagnosis not present

## 2022-04-03 DIAGNOSIS — C7951 Secondary malignant neoplasm of bone: Secondary | ICD-10-CM | POA: Diagnosis not present

## 2022-04-03 DIAGNOSIS — F32A Depression, unspecified: Secondary | ICD-10-CM | POA: Diagnosis not present

## 2022-04-03 DIAGNOSIS — E119 Type 2 diabetes mellitus without complications: Secondary | ICD-10-CM | POA: Diagnosis not present

## 2022-04-03 DIAGNOSIS — E785 Hyperlipidemia, unspecified: Secondary | ICD-10-CM | POA: Diagnosis not present

## 2022-04-03 DIAGNOSIS — C7949 Secondary malignant neoplasm of other parts of nervous system: Secondary | ICD-10-CM | POA: Diagnosis not present

## 2022-04-03 DIAGNOSIS — R000 Tachycardia, unspecified: Secondary | ICD-10-CM | POA: Diagnosis not present

## 2022-04-04 DIAGNOSIS — C73 Malignant neoplasm of thyroid gland: Secondary | ICD-10-CM | POA: Diagnosis not present

## 2022-04-04 DIAGNOSIS — C7931 Secondary malignant neoplasm of brain: Secondary | ICD-10-CM | POA: Diagnosis not present

## 2022-04-04 DIAGNOSIS — F17200 Nicotine dependence, unspecified, uncomplicated: Secondary | ICD-10-CM | POA: Diagnosis not present

## 2022-04-04 DIAGNOSIS — I5022 Chronic systolic (congestive) heart failure: Secondary | ICD-10-CM | POA: Diagnosis not present

## 2022-04-05 ENCOUNTER — Inpatient Hospital Stay: Payer: Medicare Other | Admitting: Hematology

## 2022-04-05 DIAGNOSIS — M47897 Other spondylosis, lumbosacral region: Secondary | ICD-10-CM | POA: Diagnosis not present

## 2022-04-05 DIAGNOSIS — J9690 Respiratory failure, unspecified, unspecified whether with hypoxia or hypercapnia: Secondary | ICD-10-CM | POA: Diagnosis not present

## 2022-04-05 DIAGNOSIS — J449 Chronic obstructive pulmonary disease, unspecified: Secondary | ICD-10-CM | POA: Diagnosis not present

## 2022-04-06 ENCOUNTER — Encounter: Payer: Self-pay | Admitting: *Deleted

## 2022-04-06 ENCOUNTER — Ambulatory Visit: Payer: Self-pay | Admitting: *Deleted

## 2022-04-06 NOTE — Patient Instructions (Signed)
Visit Information  Thank you for taking time to visit with me today. Please don't hesitate to contact me if I can be of assistance to you.   Following are the goals we discussed today:   Goals Addressed               This Visit's Progress     Assist with [place]. (pt-stated)   On track     Care Coordination Interventions:  Assessed Social Determinant of Health Barriers. Discussed Plans for Ongoing Care Management Follow Up. Provided with Direct Contact Information for Care Management Team. Screened for Signs & Symptoms of Depression Related to Chronic Disease State.  ZDG3/OVF6 Depression Screen Completed & Results Reviewed.  Suicidal Ideation/Homicidal Ideation Assessed - None Present. Solution-Focused Strategies Employed. Deep Breathing Exercises, Relaxation Techniques & Mindfulness Meditation Strategies Taught & Encouraged Daily.   Active Listening/Reflection Utilized.  Emotional Support Provided. Verbalization of Feelings Encouraged.  Caregiver Stress Acknowledged. Caregiver Resources Reviewed. Problem Solving/Task-Centered Solutions Developed.   Psychoeducation for Mental Health Concerns Initiated. Reviewed Mental Health Medications & Discussed Importance of Compliance. Quality of Sleep Assessed & Sleep Hygiene Techniques Promoted. Participation in Counseling Encouraged. Discussed Referral to Psychiatrist for Psychotropic Medication Management. Discussed Referral to Therapist for Psychotherapeutic Counseling Services. Mailed the Following List of Agencies and Resources, on 04/06/2022:             [place] Instructions Personal Care Services Providers Encouraged to Review the List of Agencies and Resources Provided, and Be Prepared to Discuss During Our Next Scheduled Telephone [employer]. CSW Collaboration with [place], [place] with [facility] [phone]), Where Patient Currently Resides, to Discuss Discharge Disposition and to Assist with Discharge Planning Needs. ~ Madelin Headings to [place], No Home Health Agencies Available (Under [employer]). ~ [place] Refusing Placement, of Any Kind, at Present Time. ~ Outpatient Therapies (Physical and Occupational) Recommended. Continue to [employer] Through [place] [phone]).   Interventions: Talked with Earlie Server about Care Coordination program support Encouraged Aryella to talk with PCP about program support. Discussed insurance of client. She has [place] and Medicaid. Discussed fall risk of client. Client said she has fallen 5 times in past month. She said her legs feel weak and legs give way some time.She has problem getting back up and down. Uses walker. She has pain in her knees.  She uses elevator frequently in building.   Discussed Transport needs. She said she uses [facility] with lift on van to help her go to and from medical appointments. Reviewed medications procurement of client.   Reviewed vision of client. She said she has vision decrease. Client said she has difficulty standing up.   Client eats sandwiches often. Uses microwave to warm up food.  Discussed family support. She said she has limited family support Provided counseling support for client. Discussed relaxation techniques with client Client and LCSW spoke of her cancer treatments. She spoke of cancer treatments and was very fatigued after cancer treatments. She said she has had 10 cancer treatments.  Discussed her feelings about managing cancer treatments. She said she thinks she is scheduled for a type of scan soon .  Discussed art therapy.  She likes to do art drawing if able and has done art drawing in the past.  She tries to take naps as part of  relaxation approach.    Discussed finances of client.  Client said she has Representative Payee that helps her manage client finances. Client has difficulty getting up and getting to door at her apartment. It takes her a little while to get to her door.   Talked with client about ADLs completion.  She takes sink bath regularly to help with bathing Client spoke of her cancer treatments (10 treatments received).   Reviewed sleeping challenges. She has sleeping difficulty Talked of her next appointment at office of PCP on April 11 2022. Client said she would like RN with Care Coordination program to call her to discuss her nursing needs.  Discussed dental needs of client. She eats soft foods. She has difficulty paying copays for medical or dental visits. LCSW to schedule RN  phone visit with client LCSW  Theadore Nan to schedule client phone visit with Nat Christen who is designated LCSW for client going forward         Our next appointment is by telephone on 04/13/2022 at 2:00 pm.  Please call the care guide team at [phone] if you need to cancel or reschedule your appointment.   If you are experiencing a Mental Health or [place] or need someone to talk to, please call the Suicide and Crisis Lifeline: 988 call the [place] National Suicide Prevention Lifeline: (438)086-7231 or TTY: 202-801-9024 TTY (919)881-0024) to talk to a trained counselor call 1-800-273-TALK (toll free, 24 hour hotline) go to [facility] [address] [place] [phone]) call the [place]: [phone] call 911  Patient verbalizes understanding of instructions and care plan provided today and agrees to view in [place]. Active MyChart status and patient understanding of how to access instructions and care plan via MyChart confirmed with patient.     Telephone follow up appointment with care management team member scheduled for:  04/13/2022 at 2:00  pm.  Nat Christen, BSW, MSW, [place]  Licensed Clinical Social Worker  [place]  Mailing [place]. [address], [place], [place] [address] Physical Address-[address] [place], [place] [address] Toll Free Main # [phone] Fax # [phone] Cell # [phone] [email or web address]

## 2022-04-06 NOTE — Patient Outreach (Signed)
Care Coordination   Initial Visit Note   04/06/2022  Name: Kristin Wells MRN: [id number] DOB: 01-21-1951  Kristin Wells is a 71 y.o. year old female who sees Nevada Crane, Edwinna Areola, MD for primary care. I spoke with patient's daughter, Marcell Barlow by phone today.  What matters to the patients health and wellness today?   Assist with [place].   Goals Addressed               This Visit's Progress     Assist with [place]. (pt-stated)   On track     Care Coordination Interventions:  Assessed Social Determinant of Health Barriers. Discussed Plans for Ongoing Care Management Follow Up. Provided with Direct Contact Information for Care Management Team. Screened for Signs & Symptoms of Depression Related to Chronic Disease State.  YWV3/XTG6 Depression Screen Completed & Results Reviewed.  Suicidal Ideation/Homicidal Ideation Assessed - None Present. Solution-Focused Strategies Employed. Deep Breathing Exercises, Relaxation Techniques & Mindfulness Meditation Strategies Taught & Encouraged Daily.   Active Listening/Reflection Utilized.  Emotional Support Provided. Verbalization of Feelings Encouraged.  Caregiver Stress Acknowledged. Caregiver Resources Reviewed. Problem Solving/Task-Centered Solutions Developed.   Psychoeducation for Mental Health Concerns Initiated. Reviewed Mental Health Medications & Discussed Importance of Compliance. Quality of Sleep Assessed & Sleep Hygiene Techniques Promoted. Participation in Counseling Encouraged. Discussed Referral to Psychiatrist for Psychotropic Medication Management. Discussed Referral to Therapist for Psychotherapeutic Counseling Services. Mailed the Following List of Agencies and Resources, on 04/06/2022:             [place] Instructions Personal Care Services  Providers Encouraged to Review the List of Agencies and Resources Provided, and Be Prepared to Discuss During Our Next Scheduled Telephone [employer]. CSW Collaboration with [place], [place] with [facility] [phone]), Where Patient Currently Resides, to Discuss Discharge Disposition and to Assist with Discharge Planning Needs. ~ Madelin Headings to [place], No Home Health Agencies Available (Under [employer]). ~ [place] Refusing Placement, of Any Kind, at Present Time. ~ Outpatient Therapies (Physical and Occupational) Recommended. Continue to [employer] Through [place] [phone]).   Interventions: Talked with Earlie Server about Care Coordination program support Encouraged Xylina to talk with PCP about program support. Discussed insurance of client. She has [place] and Medicaid. Discussed fall risk of client. Client said she has fallen 5 times in past month. She said her legs feel weak and legs give way some time.She has problem getting back up and down. Uses walker. She has pain in her knees.  She uses elevator frequently in building.   Discussed Transport needs. She said she uses [facility] with lift on van to help her go to and from medical appointments. Reviewed medications procurement of client.   Reviewed vision of client. She said she has vision decrease. Client said she has difficulty standing up.   Client eats sandwiches often. Uses microwave to warm up food.  Discussed family support. She said she has limited family support Provided counseling support for client. Discussed relaxation techniques with client Client and LCSW spoke of her cancer treatments. She spoke of cancer treatments and was very fatigued after cancer treatments. She said she has had 10 cancer treatments.  Discussed her feelings about managing cancer treatments. She said she thinks she  is scheduled for a type of scan  soon .  Discussed art therapy.  She likes to do art drawing if able and has done art drawing in the past.  She tries to take naps as part of relaxation approach.   Discussed finances of client.  Client said she has Representative Payee that helps her manage client finances. Client has difficulty getting up and getting to door at her apartment. It takes her a little while to get to her door.   Talked with client about ADLs completion.  She takes sink bath regularly to help with bathing Client spoke of her cancer treatments (10 treatments received).   Reviewed sleeping challenges. She has sleeping difficulty Talked of her next appointment at office of PCP on April 11 2022. Client said she would like RN with Care Coordination program to call her to discuss her nursing needs.  Discussed dental needs of client. She eats soft foods. She has difficulty paying copays for medical or dental visits. LCSW to schedule RN  phone visit with client LCSW  Theadore Nan to schedule client phone visit with Nat Christen who is designated LCSW for client going forward           SDOH assessments and interventions completed:  Yes.  SDOH Interventions Today    Flowsheet Row Most Recent Value  SDOH Interventions   Food Insecurity Interventions Intervention Not Indicated, Other (Comment)  [Verified by Daughter - [place] Interventions Intervention Not Indicated, Other (Comment)  [Verified by Daughter - [place]]  Transportation Interventions Intervention Not Indicated, Patient Resources (Friends/Family), Payor Benefit, Other (Comment)  [[facility] - Verified by Daughter - [place]]  Utilities Interventions Intervention Not Indicated, Other (Comment)  [Verified by Daughter [place]orne [place]]  Alcohol Usage Interventions Intervention Not Indicated (Score <7), Other (Comment)  [Verified by Daughter [place]]  Depression Interventions/Treatment  Referral to  Psychiatry, Medication, Counseling  [Verified by Daughter - [place]]  Financial Strain Interventions Intervention Not Indicated, Other (Comment)  [Verified by Daughter Claiborne [place]]  Physical Activity Interventions Intervention Not Indicated, Other (Comments)  [Verified by Daughter - [place]orne [place]]  Stress Interventions [place], Provide Counseling, Other (Comment)  [Verified by Daughter - Claiborne [place] Winnie]  Social Connections Interventions Intervention Not Indicated, Other (Comment)  [Verified by Daughter Claiborne Bill[place]ie]     Care Coordination Interventions:  Yes, provided.   Follow up plan: Follow up call scheduled for 04/13/2022 at 2:00 pm.  Encounter Outcome:  Pt. Visit Completed.   Nat Christen, BSW, MSW, LCSW  Licensed Education officer, [facility]  Mailing [place] [place], [place] [address] Physical Address-[address], [place], [place] [address] Toll Free Main # [phone] Fax # [phone] Cell # [phone] [email or web address]

## 2022-04-09 ENCOUNTER — Inpatient Hospital Stay (HOSPITAL_COMMUNITY)
Admission: EM | Admit: 2022-04-09 | Discharge: 2022-04-14 | DRG: 640 | Disposition: A | Discharge status: Skilled Nursing Facility | Payer: Medicare Other | Attending: Internal Medicine | Admitting: Internal Medicine

## 2022-04-09 ENCOUNTER — Encounter (HOSPITAL_COMMUNITY): Payer: Self-pay

## 2022-04-09 ENCOUNTER — Emergency Department (HOSPITAL_COMMUNITY): Payer: Medicare Other

## 2022-04-09 ENCOUNTER — Other Ambulatory Visit: Payer: Self-pay

## 2022-04-09 DIAGNOSIS — R739 Hyperglycemia, unspecified: Secondary | ICD-10-CM | POA: Diagnosis not present

## 2022-04-09 DIAGNOSIS — I130 Hypertensive heart and chronic kidney disease with heart failure and stage 1 through stage 4 chronic kidney disease, or unspecified chronic kidney disease: Secondary | ICD-10-CM | POA: Diagnosis present

## 2022-04-09 DIAGNOSIS — J9621 Acute and chronic respiratory failure with hypoxia: Secondary | ICD-10-CM | POA: Diagnosis not present

## 2022-04-09 DIAGNOSIS — D509 Iron deficiency anemia, unspecified: Secondary | ICD-10-CM | POA: Diagnosis present

## 2022-04-09 DIAGNOSIS — R7989 Other specified abnormal findings of blood chemistry: Secondary | ICD-10-CM

## 2022-04-09 DIAGNOSIS — Z66 Do not resuscitate: Secondary | ICD-10-CM | POA: Diagnosis present

## 2022-04-09 DIAGNOSIS — R64 Cachexia: Secondary | ICD-10-CM | POA: Diagnosis not present

## 2022-04-09 DIAGNOSIS — I2510 Atherosclerotic heart disease of native coronary artery without angina pectoris: Secondary | ICD-10-CM | POA: Diagnosis present

## 2022-04-09 DIAGNOSIS — E1165 Type 2 diabetes mellitus with hyperglycemia: Secondary | ICD-10-CM | POA: Diagnosis present

## 2022-04-09 DIAGNOSIS — C7951 Secondary malignant neoplasm of bone: Secondary | ICD-10-CM | POA: Diagnosis present

## 2022-04-09 DIAGNOSIS — E1151 Type 2 diabetes mellitus with diabetic peripheral angiopathy without gangrene: Secondary | ICD-10-CM | POA: Diagnosis present

## 2022-04-09 DIAGNOSIS — R627 Adult failure to thrive: Secondary | ICD-10-CM | POA: Diagnosis not present

## 2022-04-09 DIAGNOSIS — E782 Mixed hyperlipidemia: Secondary | ICD-10-CM | POA: Diagnosis not present

## 2022-04-09 DIAGNOSIS — E872 Acidosis, unspecified: Secondary | ICD-10-CM | POA: Diagnosis not present

## 2022-04-09 DIAGNOSIS — F419 Anxiety disorder, unspecified: Secondary | ICD-10-CM | POA: Diagnosis present

## 2022-04-09 DIAGNOSIS — E1122 Type 2 diabetes mellitus with diabetic chronic kidney disease: Secondary | ICD-10-CM | POA: Diagnosis not present

## 2022-04-09 DIAGNOSIS — R000 Tachycardia, unspecified: Secondary | ICD-10-CM | POA: Diagnosis present

## 2022-04-09 DIAGNOSIS — E039 Hypothyroidism, unspecified: Secondary | ICD-10-CM

## 2022-04-09 DIAGNOSIS — R6889 Other general symptoms and signs: Secondary | ICD-10-CM | POA: Diagnosis not present

## 2022-04-09 DIAGNOSIS — M545 Low back pain, unspecified: Secondary | ICD-10-CM | POA: Diagnosis present

## 2022-04-09 DIAGNOSIS — I252 Old myocardial infarction: Secondary | ICD-10-CM

## 2022-04-09 DIAGNOSIS — E860 Dehydration: Secondary | ICD-10-CM | POA: Diagnosis not present

## 2022-04-09 DIAGNOSIS — Z811 Family history of alcohol abuse and dependence: Secondary | ICD-10-CM

## 2022-04-09 DIAGNOSIS — E11649 Type 2 diabetes mellitus with hypoglycemia without coma: Secondary | ICD-10-CM | POA: Diagnosis not present

## 2022-04-09 DIAGNOSIS — E890 Postprocedural hypothyroidism: Secondary | ICD-10-CM | POA: Diagnosis not present

## 2022-04-09 DIAGNOSIS — C73 Malignant neoplasm of thyroid gland: Secondary | ICD-10-CM | POA: Diagnosis not present

## 2022-04-09 DIAGNOSIS — J9611 Chronic respiratory failure with hypoxia: Secondary | ICD-10-CM | POA: Diagnosis present

## 2022-04-09 DIAGNOSIS — Z515 Encounter for palliative care: Secondary | ICD-10-CM | POA: Diagnosis not present

## 2022-04-09 DIAGNOSIS — E8809 Other disorders of plasma-protein metabolism, not elsewhere classified: Secondary | ICD-10-CM | POA: Diagnosis not present

## 2022-04-09 DIAGNOSIS — E46 Unspecified protein-calorie malnutrition: Secondary | ICD-10-CM | POA: Diagnosis not present

## 2022-04-09 DIAGNOSIS — Z85850 Personal history of malignant neoplasm of thyroid: Secondary | ICD-10-CM

## 2022-04-09 DIAGNOSIS — Z9981 Dependence on supplemental oxygen: Secondary | ICD-10-CM

## 2022-04-09 DIAGNOSIS — K219 Gastro-esophageal reflux disease without esophagitis: Secondary | ICD-10-CM | POA: Diagnosis not present

## 2022-04-09 DIAGNOSIS — D72829 Elevated white blood cell count, unspecified: Secondary | ICD-10-CM | POA: Diagnosis present

## 2022-04-09 DIAGNOSIS — Z681 Body mass index (BMI) 19 or less, adult: Secondary | ICD-10-CM

## 2022-04-09 DIAGNOSIS — R0602 Shortness of breath: Secondary | ICD-10-CM | POA: Diagnosis not present

## 2022-04-09 DIAGNOSIS — Z79899 Other long term (current) drug therapy: Secondary | ICD-10-CM

## 2022-04-09 DIAGNOSIS — I5033 Acute on chronic diastolic (congestive) heart failure: Secondary | ICD-10-CM | POA: Diagnosis present

## 2022-04-09 DIAGNOSIS — I959 Hypotension, unspecified: Secondary | ICD-10-CM | POA: Diagnosis present

## 2022-04-09 DIAGNOSIS — Z8049 Family history of malignant neoplasm of other genital organs: Secondary | ICD-10-CM

## 2022-04-09 DIAGNOSIS — Z79890 Hormone replacement therapy (postmenopausal): Secondary | ICD-10-CM

## 2022-04-09 DIAGNOSIS — Z7982 Long term (current) use of aspirin: Secondary | ICD-10-CM

## 2022-04-09 DIAGNOSIS — R911 Solitary pulmonary nodule: Secondary | ICD-10-CM | POA: Diagnosis present

## 2022-04-09 DIAGNOSIS — Z743 Need for continuous supervision: Secondary | ICD-10-CM | POA: Diagnosis not present

## 2022-04-09 DIAGNOSIS — E871 Hypo-osmolality and hyponatremia: Secondary | ICD-10-CM | POA: Diagnosis present

## 2022-04-09 DIAGNOSIS — D696 Thrombocytopenia, unspecified: Secondary | ICD-10-CM | POA: Diagnosis not present

## 2022-04-09 DIAGNOSIS — Z794 Long term (current) use of insulin: Secondary | ICD-10-CM

## 2022-04-09 DIAGNOSIS — Z833 Family history of diabetes mellitus: Secondary | ICD-10-CM

## 2022-04-09 DIAGNOSIS — N179 Acute kidney failure, unspecified: Secondary | ICD-10-CM | POA: Diagnosis present

## 2022-04-09 DIAGNOSIS — Z8249 Family history of ischemic heart disease and other diseases of the circulatory system: Secondary | ICD-10-CM

## 2022-04-09 DIAGNOSIS — E43 Unspecified severe protein-calorie malnutrition: Secondary | ICD-10-CM | POA: Diagnosis not present

## 2022-04-09 DIAGNOSIS — R531 Weakness: Secondary | ICD-10-CM

## 2022-04-09 DIAGNOSIS — N1831 Chronic kidney disease, stage 3a: Secondary | ICD-10-CM | POA: Diagnosis not present

## 2022-04-09 DIAGNOSIS — J9811 Atelectasis: Secondary | ICD-10-CM | POA: Diagnosis not present

## 2022-04-09 DIAGNOSIS — I499 Cardiac arrhythmia, unspecified: Secondary | ICD-10-CM | POA: Diagnosis not present

## 2022-04-09 DIAGNOSIS — H9190 Unspecified hearing loss, unspecified ear: Secondary | ICD-10-CM | POA: Diagnosis present

## 2022-04-09 DIAGNOSIS — J439 Emphysema, unspecified: Secondary | ICD-10-CM | POA: Diagnosis not present

## 2022-04-09 DIAGNOSIS — F32A Depression, unspecified: Secondary | ICD-10-CM | POA: Diagnosis present

## 2022-04-09 DIAGNOSIS — J449 Chronic obstructive pulmonary disease, unspecified: Secondary | ICD-10-CM | POA: Diagnosis present

## 2022-04-09 DIAGNOSIS — F17210 Nicotine dependence, cigarettes, uncomplicated: Secondary | ICD-10-CM | POA: Diagnosis present

## 2022-04-09 DIAGNOSIS — Z808 Family history of malignant neoplasm of other organs or systems: Secondary | ICD-10-CM

## 2022-04-09 DIAGNOSIS — Z7189 Other specified counseling: Secondary | ICD-10-CM | POA: Diagnosis not present

## 2022-04-09 LAB — COMPREHENSIVE METABOLIC PANEL
ALT: 15 U/L (ref 0–44)
AST: 50 U/L — ABNORMAL HIGH (ref 15–41)
Albumin: 3.3 g/dL — ABNORMAL LOW (ref 3.5–5.0)
Alkaline Phosphatase: 117 U/L (ref 38–126)
Anion gap: 18 — ABNORMAL HIGH (ref 5–15)
BUN: 63 mg/dL — ABNORMAL HIGH (ref 8–23)
CO2: 27 mmol/L (ref 22–32)
Calcium: 9.5 mg/dL (ref 8.9–10.3)
Chloride: 88 mmol/L — ABNORMAL LOW (ref 98–111)
Creatinine, Ser: 1.22 mg/dL — ABNORMAL HIGH (ref 0.44–1.00)
GFR, Estimated: 48 mL/min — ABNORMAL LOW (ref >60)
Glucose, Bld: 364 mg/dL — ABNORMAL HIGH (ref 70–99)
Potassium: 3.7 mmol/L (ref 3.5–5.1)
Sodium: 133 mmol/L — ABNORMAL LOW (ref 135–145)
Total Bilirubin: 1.1 mg/dL (ref 0.3–1.2)
Total Protein: 7.7 g/dL (ref 6.5–8.1)

## 2022-04-09 LAB — BLOOD GAS, VENOUS
Acid-Base Excess: 12.4 mmol/L — ABNORMAL HIGH (ref 0.0–2.0)
Bicarbonate: 38 mmol/L — ABNORMAL HIGH (ref 20.0–28.0)
Drawn by: 57519
FIO2: 21 %
O2 Saturation: 16.7 %
Patient temperature: 36.8
pCO2, Ven: 51 mmHg (ref 44–60)
pH, Ven: 7.48 — ABNORMAL HIGH (ref 7.25–7.43)
pO2, Ven: <31 mmHg — CL (ref 32–45)

## 2022-04-09 LAB — CBC WITH DIFFERENTIAL/PLATELET
Abs Immature Granulocytes: 0.45 10*3/uL — ABNORMAL HIGH (ref 0.00–0.07)
Basophils Absolute: 0.1 10*3/uL (ref 0.0–0.1)
Basophils Relative: 0 %
Eosinophils Absolute: 0 10*3/uL (ref 0.0–0.5)
Eosinophils Relative: 0 %
HCT: 43.6 % (ref 36.0–46.0)
Hemoglobin: 14 g/dL (ref 12.0–15.0)
Immature Granulocytes: 3 %
Lymphocytes Relative: 3 %
Lymphs Abs: 0.5 10*3/uL — ABNORMAL LOW (ref 0.7–4.0)
MCH: 26.1 pg (ref 26.0–34.0)
MCHC: 32.1 g/dL (ref 30.0–36.0)
MCV: 81.2 fL (ref 80.0–100.0)
Monocytes Absolute: 0.6 10*3/uL (ref 0.1–1.0)
Monocytes Relative: 4 %
Neutro Abs: 12.1 10*3/uL — ABNORMAL HIGH (ref 1.7–7.7)
Neutrophils Relative %: 90 %
Platelets: 137 10*3/uL — ABNORMAL LOW (ref 150–400)
RBC: 5.37 MIL/uL — ABNORMAL HIGH (ref 3.87–5.11)
RDW: 18.8 % — ABNORMAL HIGH (ref 11.5–15.5)
WBC: 13.7 10*3/uL — ABNORMAL HIGH (ref 4.0–10.5)
nRBC: 0.2 % (ref 0.0–0.2)

## 2022-04-09 LAB — URINALYSIS, ROUTINE W REFLEX MICROSCOPIC
Bilirubin Urine: NEGATIVE
Glucose, UA: 50 mg/dL — AB
Ketones, ur: NEGATIVE mg/dL
Leukocytes,Ua: NEGATIVE
Nitrite: NEGATIVE
Protein, ur: 30 mg/dL — AB
Specific Gravity, Urine: 1.015 (ref 1.005–1.030)
pH: 6 (ref 5.0–8.0)

## 2022-04-09 LAB — LACTIC ACID, PLASMA
Lactic Acid, Venous: 1.9 mmol/L (ref 0.5–1.9)
Lactic Acid, Venous: 2.6 mmol/L (ref 0.5–1.9)

## 2022-04-09 LAB — TROPONIN I (HIGH SENSITIVITY)
Troponin I (High Sensitivity): 15 ng/L (ref <18)
Troponin I (High Sensitivity): 15 ng/L (ref <18)

## 2022-04-09 LAB — BRAIN NATRIURETIC PEPTIDE: B Natriuretic Peptide: 514 pg/mL — ABNORMAL HIGH (ref 0.0–100.0)

## 2022-04-09 LAB — D-DIMER, QUANTITATIVE: D-Dimer, Quant: >20 ug/mL-FEU — ABNORMAL HIGH (ref 0.00–0.50)

## 2022-04-09 MED ORDER — LACTATED RINGERS IV BOLUS
1000.0000 mL | Freq: Once | INTRAVENOUS | Status: AC
  Administered 2022-04-09: 1000 mL via INTRAVENOUS

## 2022-04-09 MED ORDER — IOHEXOL 350 MG/ML SOLN
75.0000 mL | Freq: Once | INTRAVENOUS | Status: AC | PRN
  Administered 2022-04-09: 75 mL via INTRAVENOUS

## 2022-04-09 NOTE — ED Notes (Signed)
Patient transported to CT 

## 2022-04-09 NOTE — ED Triage Notes (Signed)
Patient presents to ED via [facility] c/o weakness and generalized pain. EMS reports CBG 429 currently taking dexamethasone. Denies chest pain or shortness of breath.

## 2022-04-09 NOTE — ED Notes (Signed)
Patient skin soiled with stool, hair matted and tangled peri care given.

## 2022-04-09 NOTE — H&P (Addendum)
History and Physical    Patient: Kristin Wells:[id number] DOB: 30-Mar-1951 DOA: 04/09/2022 DOS: the patient was seen and examined on 04/10/2022 PCP: Celene Squibb, MD  Patient coming from: Home  Chief Complaint:  Chief Complaint  Patient presents with   Weakness    Generalized pain   HPI: Kristin Wells is a 71 y.o. 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  Patient was recently admitted to [facility] due to concern for cord compression and hypoxia, but she left AMA because she did not want to go to the nursing home on discharge.  On returning home, she was weak and unable to ambulate due to high risk of fall, so she ended up being unkempt, but she was alert and oriented x 3, she denies chest pain, bilateral upper and lower extremity numbness or tingling, nausea or vomiting.   ED Course:  In the emergency department, she was tachycardic, tachypneic and BP was 91/74.  Workup in the ED showed CBC: WBC 13.7, hemoglobin 14.0, hematocrit 43.6, MCV 2.2, platelets 137 BMP: Sodium 133, potassium 3.7, chloride 88, bicarb 27, glucose 364, BUN/creatinine 63/1.22 (baseline creatinine 0.9-1.1),  Albumin 3.3.  D-dimer > 20, troponin x 2 was flat at 15, BNP 514, lactic acid 2.6, urinalysis was unimpressive for UTI. CT angiography chest with contrast showed no evidence of pulmonary embolism.  Stable 2 mm LLL pulmonary nodule.  Stable enlarged high right paratracheal lymph node.  Stable osseous metastatic disease Chest x-ray showed no signs of pulmonary edema or new focal infiltrates. IV hydration was provided.  Hospitalist was asked to  admit patient for further evaluation and management.   Review of Systems: Review of systems as noted in the HPI. All other systems reviewed and are negative.   Past Medical History:  Diagnosis Date   Acute respiratory failure ([place]) 06/2011   Vent dependent sec to COPD/PNA   Anemia    Anxiety    Anxiety disorder, unspecified    Arthritis    Arthritis    Atherosclerotic heart disease of native coronary artery with unspecified angina pectoris (HCC)    Back pain    Bronchitis    CHF (congestive heart failure) (HCC)    Chronic obstructive pulmonary disease with (acute) exacerbation (HCC)    Chronic obstructive pulmonary disease, unspecified (HCC)    Chronic obstructive pulmonary disease, unspecified (HCC)    COPD (chronic obstructive pulmonary disease) (HCC)    Coronary artery disease    Demand ischemia of myocardium 06/28/2011   Depression    Diabetes mellitus without complication (HCC)    Dizziness and giddiness    Dyspnea    Edema, unspecified    GERD (gastroesophageal reflux disease)    Hyperlipidemia    Hyperlipidemia, unspecified    Hypertension    Hypertensive heart disease without heart failure    Hypothyroidism, unspecified    Insomnia, unspecified    Ischemic cardiomyopathy 06/28/2011   EF 25%. Myoview stress test later showed ejection fraction of 65%   Lumbago    Major depressive disorder, single episode, unspecified    MI, acute, non ST segment elevation (HCC) 06/29/2011   Myoview stress test revealed mild perfusion  defect   Myocardial infarction ([place])    Neuralgia and neuritis, unspecified    Nicotine dependence, unspecified, uncomplicated    Obstructive chronic bronchitis with acute bronchitis ([place])    Other acute osteomyelitis, unspecified ankle and foot (HCC)    Overactive bladder    Oxygen deficiency    PNA (pneumonia) 06/29/2011   Pneumonia    Pneumonia    Primary thyroid malignancy ([place]) 03/26/2021   Rectocele 10/09/2012   Substance abuse ([place])     MANY YEARS AGO   Symptomatic menopausal or female climacteric states    Thyroid enlargement 06/28/2011   Type 2 diabetes mellitus with diabetic peripheral angiopathy without gangrene (HCC)    Type 2 diabetes mellitus with hyperglycemia ([place])    Unspecified convulsions (HCC)    Past Surgical History:  Procedure Laterality Date   COLONOSCOPY  Feb 2013   Dr. Michail Sermon: internal hemorrhoids, normal view of ileum, multiple hyperplastic polyps   ESOPHAGOGASTRODUODENOSCOPY  Feb 2013   Dr. Michail Sermon: normal duodenum, candida esophagitis, reactive gastropathy   THYROIDECTOMY N/A 09/23/2021   Procedure: TOTAL THYROIDECTOMY WITH LIMITED LYMPH NODE DISSECTION;  Surgeon: Armandina Gemma, MD;  Location: [facility] ORS;  Service: General;  Laterality: N/A;   TUBAL LIGATION      Social History:  reports that she has been smoking cigarettes. She started smoking about 54 years ago. She has a 24.50 pack-year smoking history. She has been exposed to tobacco smoke. She has never used smokeless tobacco. She reports that she does not drink alcohol and does not use drugs.   Allergies  Allergen Reactions   Cat Hair Extract Cough    sneezing   Other Cough    Coughing and sneezing d/t some cats, dogs and perfumes    Family History  Problem Relation Age of Onset   Diabetes Mother    Hypertension Mother    Diabetes Father    Hypertension Father    Heart disease Father        HEART FAILURE   Seizures Sister    Uterine cancer Sister    Early death Sister        Pneumonia at 67 months old   Thyroid cancer Sister    Skin cancer Brother        [place] type   Alcohol abuse Brother    Cirrhosis Brother    Cancer Maternal Uncle    Early death Son    Drug abuse Son    Heart attack Son    Colon cancer Neg Hx      Prior to Admission medications   Medication Sig Start Date End Date Taking? Authorizing Provider  dexamethasone (DECADRON) 4 MG tablet Take 4 mg by mouth See admin instructions. Take 1 tablet (25m) by  mouth 4 times daily every evening with meals . Continue until you F/U with oncologist 04/06/22  Yes [provider]  levothyroxine (SYNTHROID) 100 MCG tablet Take 100 mcg by mouth daily. 01/21/22  Yes [provider]  metFORMIN (GLUCOPHAGE) 500 MG tablet Take 500 mg by mouth 2 (two) times daily with a meal. Extended release 02/22/13  Yes [provider]  albuterol (VENTOLIN HFA) 108 (90 Base) MCG/ACT inhaler Inhale into the lungs. 10/21/21   [provider]  ALPRAZolam (Duanne Moron 0.25 MG tablet Take 0.25 mg by mouth 2 (two) times daily as needed for anxiety.    [provider]  aspirin EC 81 MG tablet Take 81 mg by mouth daily.    [provider]  blood  glucose meter kit and supplies KIT Dispense based on patient and insurance preference. Use up to four times daily as directed. (FOR ICD-9 250.00, 250.01). 03/15/17   Raylene Everts, MD  BREZTRI AEROSPHERE 160-9-4.8 MCG/ACT AERO Inhale into the lungs. 08/25/20   [provider]  calcium carbonate (TUMS) 500 MG chewable tablet Chew 2 tablets (400 mg of elemental calcium total) by mouth 2 (two) times daily. 09/24/21   Armandina Gemma, MD  cetirizine (ZYRTEC) 10 MG tablet Take 10 mg by mouth daily.    [provider]  cyclobenzaprine (FLEXERIL) 10 MG tablet Take 10 mg by mouth 2 (two) times daily.    [provider]  DULoxetine (CYMBALTA) 60 MG capsule Take 60 mg by mouth daily. 12/31/21   [provider]  ergocalciferol (VITAMIN D2) 1.25 MG (50000 UT) capsule Take 50,000 Units by mouth once a week. Thursday    [provider]  furosemide (LASIX) 20 MG tablet Take 1 tablet (20 mg total) by mouth daily. 01/10/14   Lendon Colonel, NP  GLUCERNA (GLUCERNA) LIQD Take 237 mLs by mouth 3 (three) times daily between meals. 06/01/17   Raylene Everts, MD  guaiFENesin (MUCINEX) 600 MG 12 hr tablet Take 1 tablet (600 mg total) by mouth 2 (two) times daily. 01/31/17   Kathie Dike, MD  Incontinence Supply Disposable (POISE PANTILINERS) PADS 1 each by Does not apply route as needed. 06/01/17   Raylene Everts, MD  levothyroxine (SYNTHROID) 88 MCG tablet Take 1 tablet (88 mcg total) by mouth daily before breakfast. 01/27/22   Nida, Marella Chimes, MD  naloxone [facility]) nasal spray 4 mg/0.1 mL SMARTSIG:1 Both Nares Daily 11/20/21   [provider]  ondansetron (ZOFRAN-ODT) 4 MG disintegrating tablet Take 4 mg by mouth 2 (two) times daily. 01/18/22   [provider]  pantoprazole (PROTONIX) 40 MG tablet Take 40 mg by mouth daily.    [provider]  potassium chloride (K-DUR) 10 MEQ tablet Take 1 tablet (10 mEq total) by mouth daily. 08/22/16   Raylene Everts, MD  rosuvastatin (CRESTOR) 20 MG tablet Take 1 tablet (20 mg total) by mouth daily. 05/14/21   Fay Records, MD  sertraline (ZOLOFT) 50 MG tablet Take 50 mg by mouth daily. 01/18/22   [provider]  traMADol (ULTRAM) 50 MG tablet Take 1-2 tablets (50-100 mg total) by mouth every 6 (six) hours as needed for moderate pain. 09/24/21   Armandina Gemma, MD  zolpidem (AMBIEN) 5 MG tablet Take 1 tablet (5 mg total) by mouth at bedtime as needed for sleep. 02/02/22   Derek Jack, MD    Physical Exam: BP 108/81   Pulse (!) 115   Temp 98.1 F (36.7 C) (Oral)   Resp (!) 25   SpO2 100%   General: 71 y.o. year-old female cachectic, but in no acute distress.  Alert and oriented x3. HEENT: NCAT, EOMI, dry mucous membrane Neck: Supple, trachea medial Cardiovascular: Tachycardia.  Regular rate and rhythm with no rubs or gallops.  No thyromegaly or JVD noted.  No lower extremity edema. 2/4 pulses in all 4 extremities. Respiratory: Tachypnea.  Scattered wheezes and mild rhonchi on auscultation.   Abdomen: Soft, nontender nondistended with normal bowel sounds x4 quadrants. Muskuloskeletal: No cyanosis, clubbing or edema noted bilaterally Neuro: CN II-XII intact, strength 5/5 x 4,  sensation, reflexes intact Skin: No ulcerative lesions noted or rashes Psychiatry: Judgement and insight appear normal. Mood is appropriate for condition and setting  Labs on Admission:  Basic Metabolic Panel: Recent Labs  Lab 04/09/22 1819  NA 133*  K 3.7  CL 88*  CO2 27  GLUCOSE 364*  BUN 63*  CREATININE 1.22*  CALCIUM 9.5   Liver Function Tests: Recent Labs  Lab 04/09/22 1819  AST 50*  ALT 15  ALKPHOS 117  BILITOT 1.1  PROT 7.7  ALBUMIN 3.3*   No results for input(s): "LIPASE", "AMYLASE" in the last 168 hours. No results for input(s): "AMMONIA" in the last 168 hours. CBC: Recent Labs  Lab 04/09/22 1819  WBC 13.7*  NEUTROABS 12.1*  HGB 14.0  HCT 43.6  MCV 81.2  PLT 137*   Cardiac Enzymes: No results for input(s): "CKTOTAL", "CKMB", "CKMBINDEX", "TROPONINI" in the last 168 hours.  BNP (last 3 results) Recent Labs    04/09/22 1819  BNP 514.0*    ProBNP (last 3 results) No results for input(s): "PROBNP" in the last 8760 hours.  CBG: No results for input(s): "GLUCAP" in the last 168 hours.  Radiological Exams on Admission: CT Angio Chest PE W and/or Wo Contrast  Result Date: 04/09/2022 CLINICAL DATA:  High probability for PE.  Medullary thyroid cancer. EXAM: CT ANGIOGRAPHY CHEST WITH CONTRAST TECHNIQUE: Multidetector CT imaging of the chest was performed using the standard protocol during bolus administration of intravenous contrast. Multiplanar CT image reconstructions and MIPs were obtained to evaluate the vascular anatomy. RADIATION DOSE REDUCTION: This exam was performed according to the departmental dose-optimization program which includes automated exposure control, adjustment of the mA and/or kV according to patient size and/or use of iterative reconstruction technique. CONTRAST:  58m OMNIPAQUE IOHEXOL 350 MG/ML SOLN COMPARISON:  CT chest abdomen and pelvis 03/15/2022 FINDINGS: Cardiovascular: Satisfactory opacification of the pulmonary  arteries to the segmental level. No evidence of pulmonary embolism. Normal heart size. No pericardial effusion. There are atherosclerotic calcifications of the aorta and coronary arteries. Mediastinum/Nodes: Thyroid gland is surgically absent. Enlarged high right paratracheal lymph node measuring 10 mm short axis is unchanged. No new enlarged lymph nodes. Esophagus within normal limits. Lungs/Pleura: Mild emphysema is again seen. There is minimal atelectasis in the left lung base. There is a 2 mm nodule in the left lower lobe image 6/63. This is unchanged. No new pulmonary nodules are seen. The lungs are otherwise clear. There is no pleural effusion or pneumothorax. Upper Abdomen: No acute abnormality. Musculoskeletal: T4 sclerotic lesion extending into the pedicle on the right appears unchanged. Sclerosis of the C7 vertebral body appears unchanged. Lytic lesion in the sternum with soft tissue component appears unchanged. Review of the MIP images confirms the above findings. IMPRESSION: 1. No evidence for pulmonary embolism. 2. Stable 2 mm left lower lobe pulmonary nodule. 3. Stable enlarged high right paratracheal lymph node. 4. Stable osseous metastatic disease. Aortic Atherosclerosis (ICD10-I70.0) and Emphysema (ICD10-J43.9). Electronically Signed   By: ARonney AstersM.D.   On: 04/09/2022 20:26   DG Chest 2 View  Result Date: 04/09/2022 CLINICAL DATA:  Shortness of breath EXAM: CHEST - 2 VIEW COMPARISON:  Radiograph done on 09/16/2021, CT done on 03/15/2022 FINDINGS: Cardiac size is within normal limits. Lung fields are clear of any infiltrates or pulmonary edema. There is no pleural effusion or pneumothorax. Increase in AP diameter of chest suggests COPD. IMPRESSION: There are no signs of pulmonary edema or new focal infiltrates. Electronically Signed   By: PElmer PickerM.D.   On: 04/09/2022 18:05    EKG: I independently viewed the EKG done and my findings are  as followed: Sinus tachycardia at a  rate of 119 bpm with nonspecific T wave abnormality.  Assessment/Plan Present on Admission:  Type 2 diabetes mellitus with hyperglycemia (HCC)  Mixed hyperlipidemia  Depression  Chronic obstructive pulmonary disease, unspecified (HCC)  Chronic respiratory failure with hypoxia (HCC)  Medullary thyroid carcinoma (HCC)  Principal Problem:   Generalized weakness Active Problems:   Depression   Chronic respiratory failure with hypoxia (HCC)   Mixed hyperlipidemia   Chronic obstructive pulmonary disease, unspecified (HCC)   Type 2 diabetes mellitus with hyperglycemia (HCC)   Medullary thyroid carcinoma (HCC)   Failure to thrive in adult   Iron deficiency anemia   Chronic kidney disease, stage 3a (HCC)   Elevated brain natriuretic peptide (BNP) level   Elevated d-dimer   Lactic acidosis   GERD (gastroesophageal reflux disease)   Hypoalbuminemia due to protein-calorie malnutrition (HCC)  Generalized weakness and failure to thrive in adult Protein supplement to be provided Dietitian will be consulted and we shall await further recommendation Continue fall precaution Continue PT/OT eval and treat Patient lives alone, but was unable to take care of self.  Consider discharging patient to a nursing home facility.  TOC will be consulted  Dehydration Gentle hydration provided  Lactic acidosis possibly due to dehydration Lactic acid 2.6, continue to trend lactic acid  Elevated BNP BNP 514, patient does not appear to be in CHF Chest x-ray showed no pulmonary edema Continue total input/output, daily weights and fluid restriction Continue heart healthy diet  Echocardiogram done in May 27, 2021 showed LVEF of 65 to 70%.  LV has hyperdynamic function.  No RWMA.  Mild LVH.  G1 DD.    Elevated D-dimer CT angiography of chest ruled out pulmonary embolism  CKD 3A BUN/creatinine 63/1.22 (baseline creatinine 0.9-1.1) Renally adjust medications, avoid nephrotoxic  agents/dehydration/hypotension  Hypoalbuminemia secondary to mild protein calorie malnutrition Albumin 3.3, protein supplement to be provided  Type 2 diabetes mellitus with uncontrolled hyperglycemia CBG 364, patient was recently on Decadron, she completed the dose yesterday Continue ISS and hypoglycemia protocol  Iron deficiency anemia Continue ferrous sulfate  COPD (not in acute exacerbation) Continue Ventolin, Mucinex, DuoNebs as needed  Chronic respiratory failure with hypoxia Continue supplemental oxygen  Stage IV medullary thyroid cancer with diffuse spine metastasis with epidural invasion POA Patient was recently placed on Decadron 6 and completed her last dose yesterday  Mixed hyperlipidemia Continue Crestor  GERD Continue Protonix  Depression Continue Cymbalta  Acquired hypothyroidism Continue Synthroid   DVT prophylaxis: Lovenox  Code Status: Full code  Family Communication: None at bedside  Consults: None  Severity of Illness: The appropriate patient status for this patient is OBSERVATION. Observation status is judged to be reasonable and necessary in order to provide the required intensity of service to ensure the patient's safety. The patient's presenting symptoms, physical exam findings, and initial radiographic and laboratory data in the context of their medical condition is felt to place them at decreased risk for further clinical deterioration. Furthermore, it is anticipated that the patient will be medically stable for discharge from the hospital within 2 midnights of admission.   Author: Bernadette Hoit, DO 04/10/2022 3:52 AM  For on call review [email or web address].

## 2022-04-10 DIAGNOSIS — I130 Hypertensive heart and chronic kidney disease with heart failure and stage 1 through stage 4 chronic kidney disease, or unspecified chronic kidney disease: Secondary | ICD-10-CM | POA: Diagnosis present

## 2022-04-10 DIAGNOSIS — R7989 Other specified abnormal findings of blood chemistry: Secondary | ICD-10-CM | POA: Insufficient documentation

## 2022-04-10 DIAGNOSIS — J449 Chronic obstructive pulmonary disease, unspecified: Secondary | ICD-10-CM | POA: Diagnosis not present

## 2022-04-10 DIAGNOSIS — J99 Respiratory disorders in diseases classified elsewhere: Secondary | ICD-10-CM | POA: Diagnosis not present

## 2022-04-10 DIAGNOSIS — J439 Emphysema, unspecified: Secondary | ICD-10-CM | POA: Diagnosis not present

## 2022-04-10 DIAGNOSIS — D849 Immunodeficiency, unspecified: Secondary | ICD-10-CM | POA: Diagnosis not present

## 2022-04-10 DIAGNOSIS — J9621 Acute and chronic respiratory failure with hypoxia: Secondary | ICD-10-CM | POA: Diagnosis present

## 2022-04-10 DIAGNOSIS — R627 Adult failure to thrive: Secondary | ICD-10-CM | POA: Diagnosis not present

## 2022-04-10 DIAGNOSIS — D509 Iron deficiency anemia, unspecified: Secondary | ICD-10-CM | POA: Insufficient documentation

## 2022-04-10 DIAGNOSIS — R531 Weakness: Secondary | ICD-10-CM

## 2022-04-10 DIAGNOSIS — C7951 Secondary malignant neoplasm of bone: Secondary | ICD-10-CM | POA: Diagnosis not present

## 2022-04-10 DIAGNOSIS — E46 Unspecified protein-calorie malnutrition: Secondary | ICD-10-CM | POA: Insufficient documentation

## 2022-04-10 DIAGNOSIS — M6281 Muscle weakness (generalized): Secondary | ICD-10-CM | POA: Diagnosis not present

## 2022-04-10 DIAGNOSIS — E1122 Type 2 diabetes mellitus with diabetic chronic kidney disease: Secondary | ICD-10-CM | POA: Diagnosis present

## 2022-04-10 DIAGNOSIS — E872 Acidosis, unspecified: Secondary | ICD-10-CM | POA: Diagnosis present

## 2022-04-10 DIAGNOSIS — D696 Thrombocytopenia, unspecified: Secondary | ICD-10-CM | POA: Diagnosis present

## 2022-04-10 DIAGNOSIS — E890 Postprocedural hypothyroidism: Secondary | ICD-10-CM | POA: Diagnosis present

## 2022-04-10 DIAGNOSIS — R64 Cachexia: Secondary | ICD-10-CM | POA: Diagnosis present

## 2022-04-10 DIAGNOSIS — K219 Gastro-esophageal reflux disease without esophagitis: Secondary | ICD-10-CM | POA: Insufficient documentation

## 2022-04-10 DIAGNOSIS — E1165 Type 2 diabetes mellitus with hyperglycemia: Secondary | ICD-10-CM | POA: Diagnosis not present

## 2022-04-10 DIAGNOSIS — C73 Malignant neoplasm of thyroid gland: Secondary | ICD-10-CM | POA: Diagnosis not present

## 2022-04-10 DIAGNOSIS — E11649 Type 2 diabetes mellitus with hypoglycemia without coma: Secondary | ICD-10-CM | POA: Diagnosis not present

## 2022-04-10 DIAGNOSIS — E1151 Type 2 diabetes mellitus with diabetic peripheral angiopathy without gangrene: Secondary | ICD-10-CM | POA: Diagnosis present

## 2022-04-10 DIAGNOSIS — Z66 Do not resuscitate: Secondary | ICD-10-CM | POA: Diagnosis present

## 2022-04-10 DIAGNOSIS — N1831 Chronic kidney disease, stage 3a: Secondary | ICD-10-CM | POA: Diagnosis not present

## 2022-04-10 DIAGNOSIS — N179 Acute kidney failure, unspecified: Secondary | ICD-10-CM | POA: Diagnosis present

## 2022-04-10 DIAGNOSIS — E871 Hypo-osmolality and hyponatremia: Secondary | ICD-10-CM | POA: Diagnosis present

## 2022-04-10 DIAGNOSIS — I5033 Acute on chronic diastolic (congestive) heart failure: Secondary | ICD-10-CM | POA: Diagnosis not present

## 2022-04-10 DIAGNOSIS — Z681 Body mass index (BMI) 19 or less, adult: Secondary | ICD-10-CM | POA: Diagnosis not present

## 2022-04-10 DIAGNOSIS — Z515 Encounter for palliative care: Secondary | ICD-10-CM | POA: Diagnosis not present

## 2022-04-10 DIAGNOSIS — Z7189 Other specified counseling: Secondary | ICD-10-CM | POA: Diagnosis not present

## 2022-04-10 DIAGNOSIS — J9611 Chronic respiratory failure with hypoxia: Secondary | ICD-10-CM | POA: Diagnosis not present

## 2022-04-10 DIAGNOSIS — E43 Unspecified severe protein-calorie malnutrition: Secondary | ICD-10-CM | POA: Diagnosis not present

## 2022-04-10 DIAGNOSIS — F32A Depression, unspecified: Secondary | ICD-10-CM | POA: Diagnosis present

## 2022-04-10 DIAGNOSIS — I959 Hypotension, unspecified: Secondary | ICD-10-CM | POA: Diagnosis present

## 2022-04-10 LAB — CBG MONITORING, ED
Glucose-Capillary: 268 mg/dL — ABNORMAL HIGH (ref 70–99)
Glucose-Capillary: 220 mg/dL — ABNORMAL HIGH (ref 70–99)

## 2022-04-10 LAB — CBC
HCT: 40 % (ref 36.0–46.0)
Hemoglobin: 12.9 g/dL (ref 12.0–15.0)
MCH: 26.2 pg (ref 26.0–34.0)
MCHC: 32.3 g/dL (ref 30.0–36.0)
MCV: 81.3 fL (ref 80.0–100.0)
Platelets: 122 10*3/uL — ABNORMAL LOW (ref 150–400)
RBC: 4.92 MIL/uL (ref 3.87–5.11)
RDW: 18.5 % — ABNORMAL HIGH (ref 11.5–15.5)
WBC: 12 10*3/uL — ABNORMAL HIGH (ref 4.0–10.5)
nRBC: 0.2 % (ref 0.0–0.2)

## 2022-04-10 LAB — COMPREHENSIVE METABOLIC PANEL
ALT: 12 U/L (ref 0–44)
AST: 48 U/L — ABNORMAL HIGH (ref 15–41)
Albumin: 3.1 g/dL — ABNORMAL LOW (ref 3.5–5.0)
Alkaline Phosphatase: 114 U/L (ref 38–126)
Anion gap: 15 (ref 5–15)
BUN: 56 mg/dL — ABNORMAL HIGH (ref 8–23)
CO2: 28 mmol/L (ref 22–32)
Calcium: 9.1 mg/dL (ref 8.9–10.3)
Chloride: 89 mmol/L — ABNORMAL LOW (ref 98–111)
Creatinine, Ser: 1.16 mg/dL — ABNORMAL HIGH (ref 0.44–1.00)
GFR, Estimated: 51 mL/min — ABNORMAL LOW (ref >60)
Glucose, Bld: 282 mg/dL — ABNORMAL HIGH (ref 70–99)
Potassium: 3.9 mmol/L (ref 3.5–5.1)
Sodium: 132 mmol/L — ABNORMAL LOW (ref 135–145)
Total Bilirubin: 1 mg/dL (ref 0.3–1.2)
Total Protein: 7 g/dL (ref 6.5–8.1)

## 2022-04-10 LAB — GLUCOSE, CAPILLARY
Glucose-Capillary: 233 mg/dL — ABNORMAL HIGH (ref 70–99)
Glucose-Capillary: 108 mg/dL — ABNORMAL HIGH (ref 70–99)
Glucose-Capillary: 201 mg/dL — ABNORMAL HIGH (ref 70–99)

## 2022-04-10 LAB — MAGNESIUM: Magnesium: 2.4 mg/dL (ref 1.7–2.4)

## 2022-04-10 LAB — LACTIC ACID, PLASMA
Lactic Acid, Venous: 1.6 mmol/L (ref 0.5–1.9)
Lactic Acid, Venous: 1.3 mmol/L (ref 0.5–1.9)

## 2022-04-10 LAB — PHOSPHORUS: Phosphorus: 4.3 mg/dL (ref 2.5–4.6)

## 2022-04-10 LAB — HIV ANTIBODY (ROUTINE TESTING W REFLEX): HIV Screen 4th Generation wRfx: NONREACTIVE

## 2022-04-10 MED ORDER — INSULIN ASPART 100 UNIT/ML IJ SOLN
0.0000 [IU] | Freq: Every day | INTRAMUSCULAR | Status: DC
  Administered 2022-04-10: 2 [IU] via SUBCUTANEOUS

## 2022-04-10 MED ORDER — IPRATROPIUM-ALBUTEROL 0.5-2.5 (3) MG/3ML IN SOLN: 3.0000 mL | RESPIRATORY_TRACT | Status: DC | PRN

## 2022-04-10 MED ORDER — TRAMADOL HCL 50 MG PO TABS
50.0000 mg | ORAL_TABLET | Freq: Four times a day (QID) | ORAL | Status: DC | PRN
  Administered 2022-04-10 – 2022-04-14 (×7): 50 mg via ORAL
  Filled 2022-04-10 (×8): qty 1

## 2022-04-10 MED ORDER — ACETAMINOPHEN 650 MG RE SUPP: 650.0000 mg | Freq: Four times a day (QID) | RECTAL | Status: DC | PRN

## 2022-04-10 MED ORDER — ALBUTEROL SULFATE (2.5 MG/3ML) 0.083% IN NEBU: 2.5000 mg | INHALATION_SOLUTION | RESPIRATORY_TRACT | Status: DC | PRN

## 2022-04-10 MED ORDER — INSULIN GLARGINE-YFGN 100 UNIT/ML ~~LOC~~ SOLN
10.0000 [IU] | Freq: Two times a day (BID) | SUBCUTANEOUS | Status: DC
  Administered 2022-04-10 – 2022-04-11 (×3): 10 [IU] via SUBCUTANEOUS
  Filled 2022-04-10 (×5): qty 0.1

## 2022-04-10 MED ORDER — ALPRAZOLAM 0.25 MG PO TABS
0.2500 mg | ORAL_TABLET | Freq: Two times a day (BID) | ORAL | Status: DC | PRN
  Administered 2022-04-10 – 2022-04-13 (×4): 0.25 mg via ORAL
  Filled 2022-04-10 (×5): qty 1

## 2022-04-10 MED ORDER — TRAMADOL HCL 50 MG PO TABS: 50.0000 mg | ORAL_TABLET | Freq: Four times a day (QID) | ORAL | Status: DC | PRN

## 2022-04-10 MED ORDER — INSULIN ASPART 100 UNIT/ML IJ SOLN
0.0000 [IU] | Freq: Three times a day (TID) | INTRAMUSCULAR | Status: DC
  Administered 2022-04-10: 5 [IU] via SUBCUTANEOUS
  Filled 2022-04-10: qty 1

## 2022-04-10 MED ORDER — FERROUS SULFATE 325 (65 FE) MG PO TABS
325.0000 mg | ORAL_TABLET | ORAL | Status: DC
  Administered 2022-04-10 – 2022-04-14 (×3): 325 mg via ORAL
  Filled 2022-04-10 (×4): qty 1

## 2022-04-10 MED ORDER — ONDANSETRON HCL 4 MG/2ML IJ SOLN: 4.0000 mg | Freq: Four times a day (QID) | INTRAMUSCULAR | Status: DC | PRN

## 2022-04-10 MED ORDER — ALBUTEROL SULFATE (2.5 MG/3ML) 0.083% IN NEBU: 2.5000 mg | INHALATION_SOLUTION | Freq: Four times a day (QID) | RESPIRATORY_TRACT | Status: DC | PRN

## 2022-04-10 MED ORDER — BUDESONIDE 0.5 MG/2ML IN SUSP
0.5000 mg | Freq: Two times a day (BID) | RESPIRATORY_TRACT | Status: DC
  Administered 2022-04-10 – 2022-04-14 (×9): 0.5 mg via RESPIRATORY_TRACT
  Filled 2022-04-10 (×9): qty 2

## 2022-04-10 MED ORDER — DULOXETINE HCL 60 MG PO CPEP
60.0000 mg | ORAL_CAPSULE | Freq: Every day | ORAL | Status: DC
  Administered 2022-04-10 – 2022-04-14 (×5): 60 mg via ORAL
  Filled 2022-04-10 (×5): qty 1

## 2022-04-10 MED ORDER — SODIUM CHLORIDE 0.9 % IV SOLN: INTRAVENOUS | Status: DC

## 2022-04-10 MED ORDER — INSULIN ASPART 100 UNIT/ML IJ SOLN
0.0000 [IU] | Freq: Three times a day (TID) | INTRAMUSCULAR | Status: DC
  Administered 2022-04-10: 7 [IU] via SUBCUTANEOUS
  Administered 2022-04-11: 4 [IU] via SUBCUTANEOUS
  Administered 2022-04-12: 7 [IU] via SUBCUTANEOUS
  Administered 2022-04-12: 15 [IU] via SUBCUTANEOUS
  Administered 2022-04-13 (×2): 4 [IU] via SUBCUTANEOUS
  Administered 2022-04-13: 20 [IU] via SUBCUTANEOUS
  Administered 2022-04-14: 3 [IU] via SUBCUTANEOUS
  Administered 2022-04-14: 15 [IU] via SUBCUTANEOUS

## 2022-04-10 MED ORDER — GUAIFENESIN ER 600 MG PO TB12: 600.0000 mg | ORAL_TABLET | Freq: Two times a day (BID) | ORAL | Status: DC

## 2022-04-10 MED ORDER — METHYLPREDNISOLONE SODIUM SUCC 40 MG IJ SOLR
40.0000 mg | Freq: Two times a day (BID) | INTRAMUSCULAR | Status: DC
  Administered 2022-04-10 – 2022-04-14 (×9): 40 mg via INTRAVENOUS
  Filled 2022-04-10 (×9): qty 1

## 2022-04-10 MED ORDER — ONDANSETRON HCL 4 MG PO TABS: 4.0000 mg | ORAL_TABLET | Freq: Four times a day (QID) | ORAL | Status: DC | PRN

## 2022-04-10 MED ORDER — ROSUVASTATIN CALCIUM 20 MG PO TABS
20.0000 mg | ORAL_TABLET | Freq: Every day | ORAL | Status: DC
  Administered 2022-04-10 – 2022-04-14 (×5): 20 mg via ORAL
  Filled 2022-04-10 (×5): qty 1

## 2022-04-10 MED ORDER — GLUCERNA SHAKE PO LIQD
237.0000 mL | Freq: Three times a day (TID) | ORAL | Status: DC
  Administered 2022-04-10 – 2022-04-14 (×11): 237 mL via ORAL
  Filled 2022-04-10 (×8): qty 237

## 2022-04-10 MED ORDER — IPRATROPIUM-ALBUTEROL 0.5-2.5 (3) MG/3ML IN SOLN
3.0000 mL | RESPIRATORY_TRACT | Status: DC
  Administered 2022-04-10 – 2022-04-11 (×4): 3 mL via RESPIRATORY_TRACT
  Filled 2022-04-10 (×4): qty 3

## 2022-04-10 MED ORDER — ENOXAPARIN SODIUM 40 MG/0.4ML IJ SOSY
40.0000 mg | PREFILLED_SYRINGE | INTRAMUSCULAR | Status: DC
  Administered 2022-04-10 – 2022-04-14 (×5): 40 mg via SUBCUTANEOUS
  Filled 2022-04-10 (×5): qty 0.4

## 2022-04-10 MED ORDER — FUROSEMIDE 10 MG/ML IJ SOLN
40.0000 mg | Freq: Once | INTRAMUSCULAR | Status: AC
  Administered 2022-04-10: 40 mg via INTRAVENOUS
  Filled 2022-04-10: qty 4

## 2022-04-10 MED ORDER — DM-GUAIFENESIN ER 30-600 MG PO TB12
1.0000 | ORAL_TABLET | Freq: Two times a day (BID) | ORAL | Status: DC
  Administered 2022-04-10 – 2022-04-13 (×7): 1 via ORAL
  Filled 2022-04-10 (×8): qty 1

## 2022-04-10 MED ORDER — ACETAMINOPHEN 325 MG PO TABS
650.0000 mg | ORAL_TABLET | Freq: Four times a day (QID) | ORAL | Status: DC | PRN
  Administered 2022-04-10 – 2022-04-14 (×4): 650 mg via ORAL
  Filled 2022-04-10 (×4): qty 2

## 2022-04-10 MED ORDER — PANTOPRAZOLE SODIUM 40 MG PO TBEC
40.0000 mg | DELAYED_RELEASE_TABLET | Freq: Every day | ORAL | Status: DC
  Administered 2022-04-10 – 2022-04-14 (×5): 40 mg via ORAL
  Filled 2022-04-10 (×5): qty 1

## 2022-04-10 MED ORDER — LEVOTHYROXINE SODIUM 100 MCG PO TABS
100.0000 ug | ORAL_TABLET | Freq: Every day | ORAL | Status: DC
  Administered 2022-04-10 – 2022-04-14 (×5): 100 ug via ORAL
  Filled 2022-04-10 (×6): qty 1

## 2022-04-10 NOTE — ED Notes (Signed)
Increased pt's O2 to 4L [place] for desats to 87%

## 2022-04-10 NOTE — Plan of Care (Signed)
  Problem: Acute Rehab PT Goals(only PT should resolve) Goal: Pt Will Go Supine/Side To Sit Outcome: Progressing Flowsheets (Taken 04/10/2022 1407) Pt will go Supine/Side to Sit:  with min guard assist  with supervision Goal: Patient Will Transfer Sit To/From Stand Outcome: Progressing Flowsheets (Taken 04/10/2022 1407) Patient will transfer sit to/from stand:  with supervision  with min guard assist Goal: Pt Will Transfer Bed To Chair/Chair To Bed Outcome: Progressing Flowsheets (Taken 04/10/2022 1407) Pt will Transfer Bed to Chair/Chair to Bed:  with supervision  min guard assist Goal: Pt Will Ambulate Outcome: Progressing Flowsheets (Taken 04/10/2022 1407) Pt will Ambulate:  75 feet  with min guard assist  with minimal assist  with rolling walker   2:08 PM, 04/10/22 Lonell Grandchild, MPT Physical Therapist with [facility] [phone] office [phone] mobile phone

## 2022-04-10 NOTE — Evaluation (Signed)
Physical Therapy Evaluation Patient Details Name: LAKYLA BISWAS MRN: [id number] DOB: 12/02/1950 Today's Date: 04/10/2022  History of Present Illness  FLO BERROA is a 71 y.o. 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  Patient was recently admitted to [facility] due to concern for cord compression and hypoxia, but she left AMA because she did not want to go to the nursing home on discharge.  On returning home, she was weak and unable to ambulate due to high risk of fall, so she ended up being unkempt, but she was alert and oriented x 3, she denies chest pain, bilateral upper and lower extremity numbness or tingling, nausea or vomiting.   Clinical Impression  Patient demonstrates slow labored movement for sitting up at bedside, slow labored movement for transferring to chair, able to ambulate into hallway with labored movement, flexed trunk and required verbal cueing for proper hand placement on walker due to leaning on walker with elbows.  Patient tolerated sitting up in chair after therapy - RN aware.  Patient will benefit from continued skilled physical therapy in hospital and recommended venue below to increase strength, balance, endurance for safe ADLs and gait.          Recommendations for follow up therapy are one component of a multi-disciplinary discharge planning process, led by the attending physician.  Recommendations may be updated based on patient status, additional functional criteria and insurance authorization.  Follow Up Recommendations Skilled nursing-short term rehab (<3 hours/day) Can patient physically be  transported by private vehicle: Yes    Assistance Recommended at Discharge Set up Supervision/Assistance  Patient can return home with the following  A lot of help with walking and/or transfers;A little help with bathing/dressing/bathroom;Assistance with cooking/housework;Help with stairs or ramp for entrance    Equipment Recommendations None recommended by PT  Recommendations for Other Services       Functional Status Assessment Patient has had a recent decline in their functional status and demonstrates the ability to make significant improvements in function in a reasonable and predictable amount of time.     Precautions / Restrictions Precautions Precautions: Fall Restrictions Weight Bearing Restrictions: No      Mobility  Bed Mobility Overal bed mobility: Needs Assistance Bed Mobility: Rolling, Sidelying to Sit Rolling: Min assist, Mod assist Sidelying to sit: Mod assist       General bed mobility comments: increased time, labored movement    Transfers Overall transfer level: Needs assistance Equipment used: Rolling walker (2 wheels) Transfers: Sit to/from Stand, Bed to chair/wheelchair/BSC Sit to Stand: Min assist   Step pivot transfers: Min assist       General transfer comment: slow labored movement    Ambulation/Gait Ambulation/Gait assistance: Min assist Gait Distance (Feet): 35 Feet Assistive device: Rolling walker (2 wheels) Gait Pattern/deviations: Decreased step length - right, Decreased step length - left, Decreased stride length, Trunk flexed Gait velocity: decreased     General Gait Details: slow labored unsteady cadence with flexed trunk, limited mostly due to fatigue and generalized weakness, on 2  LPM O2 with SpO2 at 93%  Science writer  Modified Rankin (Stroke Patients Only)       Balance Overall balance assessment: Needs assistance Sitting-balance support: Feet supported, No upper extremity  supported Sitting balance-Leahy Scale: Fair Sitting balance - Comments: fair/good seated at EOB   Standing balance support: During functional activity, Bilateral upper extremity supported Standing balance-Leahy Scale: Poor Standing balance comment: fair/poor using RW                             Pertinent Vitals/Pain Pain Assessment Pain Assessment: No/denies pain    Home Living Family/patient expects to be discharged to:: Private residence Living Arrangements: Alone Available Help at Discharge: Family;Available PRN/intermittently Type of Home: Apartment Home Access: Elevator       Home Layout: One level Home Equipment: Shower seat;Grab bars - tub/shower;Rolling Walker (2 wheels);Cane - quad;Cane - single point;BSC/3in1      Prior Function Prior Level of Function : Needs assist       Physical Assist : Mobility (physical);ADLs (physical) Mobility (physical): Bed mobility;Transfers;Gait;Stairs   Mobility Comments: household ambulator using RW ADLs Comments: family assist with community ADLs     Hand Dominance   Dominant Hand: Right    Extremity/Trunk Assessment   Upper Extremity Assessment Upper Extremity Assessment: Generalized weakness    Lower Extremity Assessment Lower Extremity Assessment: Generalized weakness    Cervical / Trunk Assessment Cervical / Trunk Assessment: Normal  Communication   Communication: No difficulties  Cognition Arousal/Alertness: Awake/alert Behavior During Therapy: WFL for tasks assessed/performed Overall Cognitive Status: Within Functional Limits for tasks assessed                                          General Comments      Exercises     Assessment/Plan    PT Assessment Patient needs continued PT services  PT Problem List Decreased strength;Decreased activity tolerance;Decreased balance;Decreased mobility       PT Treatment Interventions DME instruction;Gait training;Stair  training;Functional mobility training;Therapeutic activities;Patient/family education;Therapeutic exercise;Balance training    PT Goals (Current goals can be found in the Care Plan section)  Acute Rehab PT Goals Patient Stated Goal: return home after rehab PT Goal Formulation: With patient Time For Goal Achievement: 04/24/22 Potential to Achieve Goals: Good    Frequency Min 3X/week     Co-evaluation               AM-PAC PT "6 Clicks" Mobility  Outcome Measure Help needed turning from your back to your side while in a flat bed without using bedrails?: A Little Help needed moving from lying on your back to sitting on the side of a flat bed without using bedrails?: A Lot Help needed moving to and from a bed to a chair (including a wheelchair)?: A Little Help needed standing up from a chair using your arms (e.g., wheelchair or bedside chair)?: A Little Help needed to walk in hospital room?: A Lot Help needed climbing 3-5 steps with a railing? : A Lot 6 Click Score: 15    End of Session Equipment Utilized During Treatment: Oxygen Activity Tolerance: Patient tolerated treatment well;Patient limited by fatigue Patient left: in chair;with call bell/phone within reach Nurse Communication: Mobility status PT Visit Diagnosis: Unsteadiness on feet (R26.81);Other abnormalities of gait and mobility (R26.89);Muscle weakness (generalized) (M62.81)    Time: 2376-2831 PT Time Calculation (min) (ACUTE ONLY): 34 min  Charges:   PT Evaluation $PT Eval Moderate Complexity: 1 Mod PT Treatments $Therapeutic Activity: 23-37 mins        2:06 PM, 04/10/22 Lonell Grandchild, MPT Physical Therapist with [facility] [phone] office [phone] mobile phone

## 2022-04-10 NOTE — Care Management Obs Status (Signed)
[place] NOTIFICATION   Patient Details  Name: Kristin Wells MRN: [id number] Date of Birth: 1950/08/31   Medicare Observation Status Notification Given:  Yes    Iona Beard, [place] 04/10/2022, 1:54 PM

## 2022-04-10 NOTE — TOC Initial Note (Signed)
Transition of Care [facility]) - Initial/Assessment Note    Patient Details  Name: Kristin Wells MRN: [id number] Date of Birth: 06/24/1950  Transition [facility] CM/SW Contact:    Iona Beard, [place] Phone Number: 04/10/2022, 1:55 PM  Clinical Narrative:                 [facility] consulted for SNF placement. CSW spoke with pts daughter who states that pt understands she will need SNF as she did not manage well at home. CSW explained that PT will need to evaluate pt. Once PT has made recommendations TOC will follow up with pts daughter and pt. IF SNF is recommended they are interested in [facility]. Pts daughter also interested in palliative referral. TOC to follow.   Expected Discharge Plan: Skilled Nursing Facility Barriers to Discharge: Continued Medical Work up   Patient Goals and CMS Choice Patient states their goals for this hospitalization and ongoing recovery are:: go to SNF CMS Medicare.gov Compare Post Acute Care list provided to:: Patient Choice offered to / list presented to : Patient, Adult Children  Expected Discharge Plan and Services Expected Discharge Plan: [place] In-house Referral: Clinical Social Work Discharge Planning Services: CM Consult Post Acute Care Choice: [place] arrangements for the past 2 months: [place]                                      Prior Living Arrangements/Services Living arrangements for the past 2 months: Single Family Home Lives with:: Self Patient language and need for interpreter reviewed:: Yes Do you feel safe going back to the place where you live?: Yes      Need for Family Participation in Patient Care: Yes (Comment) Care giver support system in place?: Yes (comment)   Criminal Activity/Legal Involvement Pertinent to Current Situation/Hospitalization: No - Comment as needed  Activities of Daily Living      Permission Sought/Granted                  Emotional  Assessment         Alcohol / Substance Use: Not Applicable Psych Involvement: No (comment)  Admission diagnosis:  Thrombocytopenia ([place]) [D69.6] Failure to thrive in adult [R62.7] AKI (acute kidney injury) ([place]) [N17.9] Acute kidney injury ([place]) [N17.9] Patient Active Problem List   Diagnosis Date Noted   Generalized weakness 04/10/2022   Failure to thrive in adult 04/10/2022   Iron deficiency anemia 04/10/2022   Chronic kidney disease, stage 3a ([place]) 04/10/2022   Elevated brain natriuretic peptide (BNP) level 04/10/2022   Elevated d-dimer 04/10/2022   Lactic acidosis 04/10/2022   GERD (gastroesophageal reflux disease) 04/10/2022   Hypoalbuminemia due to protein-calorie malnutrition ([place]) 04/10/2022   Genetic testing 12/29/2021   Hypothyroidism due to Hashimoto's thyroiditis 03/26/2021   Medullary thyroid carcinoma ([place]) 03/26/2021   Thyroid nodule 03/03/2021   Chronic obstructive pulmonary disease with (acute) exacerbation (HCC)    Mixed hyperlipidemia    Major depressive disorder, single episode, unspecified    Nicotine dependence, unspecified, uncomplicated    Overactive bladder    Anxiety disorder, unspecified    Atherosclerotic heart disease of native coronary artery with unspecified angina pectoris (HCC)    Chronic obstructive pulmonary disease, unspecified ([place])    Edema, unspecified    Hypertensive heart disease without heart failure    Postsurgical hypothyroidism    Neuralgia and neuritis, unspecified  Other acute osteomyelitis, unspecified ankle and foot ([place])    Type 2 diabetes mellitus with diabetic peripheral angiopathy without gangrene ([place])    Type 2 diabetes mellitus with hyperglycemia ([place])    Unspecified convulsions ([place])    COPD exacerbation ([place]) 01/30/2017   Onychomycosis 05/28/2014   Diabetic foot ulcer ([place]) 04/30/2014   Rectocele 10/09/2012   Diabetes mellitus without complication ([place]) 63/87/5643   Chronic respiratory failure with hypoxia  ([place]) 10/04/2012   CAD (coronary artery disease) 32/95/1884   Systolic CHF, chronic ([place]) 02/21/2012   Chronic back pain 02/20/2012   Hypercapnia 07/30/2011   HTN (hypertension) 07/11/2011   Current smoker 07/11/2011   Anxiety disorder 07/11/2011   Goiter 06/28/2011   Ischemic cardiomyopathy 06/28/2011   Depression 02/04/2010   COPD with emphysema ([place]) 02/04/2010   PCP:  Celene Squibb, MD Pharmacy:   [facility], [address] [place] [address] Phone: [phone] Fax: [place], [address] AT [place] [place] [address] Phone: [phone] Fax: [phone]     Social Determinants of Health (SDOH) Interventions    Readmission Risk Interventions     No data to display

## 2022-04-10 NOTE — Progress Notes (Signed)
PROGRESS NOTE    Kristin Wells  POE:[id number] DOB: [birth year] DOA: 04/09/2022 PCP: Celene Squibb, MD   Brief Narrative:  71 year old female with medical history significant of hypertension, hyperlipidemia, type 2 diabetes mellitus, COPD on supplemental oxygen at 2 LPM, stage IV medullary thyroid cancer (s/p total thyroidectomy in 09/2021 with bone metastasis), depression, recent hospitalization at [facility] due to concern for cord compression and hypoxia with recommendations for SNF placement but patient left AMA because she did not want to go to nursing home on discharge presented with generalized weakness and unable to take care of herself.  On presentation, WBC was 13.7, D-dimer was more than 20, lactic acid 2.6, CTA chest was negative for PE and showed stable osseous metastatic disease and stable 2 mm left lower lobe pulmonary nodule.  She was started on IV fluids.  Assessment & Plan:   Generalized weakness and failure to thrive Stage IV medullary cancer status post total thyroidectomy with bony metastases with recent diagnosis of cord compression treated with Decadron Physical deconditioning Goals of care -She was recently hospitalized at [facility] due to concern for cord compression and hypoxia with recommendations for SNF placement but patient left AMA because she did not want to go to nursing home on discharge.  However, she  presented with generalized weakness and unable to take care of herself. -Reviewed in care everywhere notes from [facility]: There was some discussion about possibility of hospice involvement.  Will consult palliative care and oncology. -She apparently completed course of Decadron 1 day prior to presentation -PT/OT eval.  Might need SNF placement.  Overall prognosis is poor -Nutrition/dietitian consult.  Fall precautions -Discussed with patient: She agrees for DNR status.  Dehydration -Treated with IV fluids.  DC IV  fluids because of worsening hypoxia  Acute on chronic diastolic heart failure -Required IV Lasix during recent hospitalization at [facility].  On oral Lasix at home.  BNP slightly elevated; chest x-ray showed no pulmonary edema.  Strict input and output.  Daily weights.  Fluid restriction.  DC IV fluids.  Echo on May 27, 2021 had shown EF of 65 to 70% with grade 1 diastolic dysfunction -Give 1 dose of IV Lasix 40 mg now.  Reevaluate in AM.  Acute on chronic respiratory failure with hypoxia COPD -Normally wears 2 L oxygen via nasal cannula.  Currently requiring 4 L oxygen.  Wean off as able.  Diuretic and IV fluid plan as above. -Will add nebs.  She is also wheezing: Start steroids. -Incentive spirometry/flutter valve  Lactic acidosis -Possibly from all of the above.  Resolved  CKD stage IIIa -Creatinine at baseline.  Repeat a.m. labs  Hyponatremia With mild.  Encourage oral intake.  Repeat a.m. labs  Leukocytosis -Mild.  Improving  Thrombocytopenia -Questionable cause.  No signs of bleeding.  Monitor  Diabetes mellitus type 2 with hyperglycemia -Continue CBGs with SSI.  Patient was recently on Decadron as an outpatient.  History of iron deficiency anemia Hemoglobin stable.  Continue iron supplement  Hyperlipidemia -Resume Crestor  Depression -Continue Cymbalta  Acquired hypothyroidism -Continue Synthroid  DVT prophylaxis: Lovenox Code Status: DNR Family Communication: None at bedside Disposition Plan: Status is: Observation The patient will require care spanning > 2 midnights and should be moved to inpatient because: Of severity of illness.  Need for PT eval and possible SNF placement.  Currently more hypoxic.  Consultants: Consult oncology and palliative care  Procedures: None  Antimicrobials: None  Subjective: Patient seen and examined at bedside.  Poor historian.  Feels short of breath and very weak.  No chest pain, fever, vomiting  reported.  Objective: Vitals:   04/09/22 2300 04/10/22 0200 04/10/22 0300 04/10/22 0630  BP: 111/83 99/69 108/81 (!) 130/94  Pulse: (!) 111 (!) 110 (!) 115 (!) 107  Resp: (!) 27 (!) 27 (!) 25 (!) 29  Temp:    98.1 F (36.7 C)  TempSrc:    Oral  SpO2: 97% 100% 100% 97%    Intake/Output Summary (Last 24 hours) at 04/10/2022 0725 Last data filed at 04/09/2022 2024 Gross per 24 hour  Intake 700 ml  Output --  Net 700 ml   There were no vitals filed for this visit.  Examination:  General exam: Appears calm and comfortable.  Looks chronically ill and deconditioned.  Currently on 4 L oxygen by nasal cannula. Respiratory system: Bilateral decreased breath sounds at bases with scattered crackles and tachypnea Cardiovascular system: S1 & S2 heard, tachycardic  gastrointestinal system: Abdomen is nondistended, soft and nontender. Normal bowel sounds heard. Extremities: No cyanosis, clubbing; trace lower extremity edema present Central nervous system: Alert and oriented.  Slow to respond.  Poor historian no focal neurological deficits. Moving extremities Skin: No rashes, lesions or ulcers Psychiatry: Flat affect.  Not agitated.    Data Reviewed: I have personally reviewed following labs and imaging studies  CBC: Recent Labs  Lab 04/09/22 1819 04/10/22 0343  WBC 13.7* 12.0*  NEUTROABS 12.1*  --   HGB 14.0 12.9  HCT 43.6 40.0  MCV 81.2 81.3  PLT 137* 378*   Basic Metabolic Panel: Recent Labs  Lab 04/09/22 1819 04/10/22 0343  NA 133* 132*  K 3.7 3.9  CL 88* 89*  CO2 27 28  GLUCOSE 364* 282*  BUN 63* 56*  CREATININE 1.22* 1.16*  CALCIUM 9.5 9.1  MG  --  2.4  PHOS  --  4.3   GFR: CrCl cannot be calculated (Unknown ideal weight.). Liver Function Tests: Recent Labs  Lab 04/09/22 1819 04/10/22 0343  AST 50* 48*  ALT 15 12  ALKPHOS 117 114  BILITOT 1.1 1.0  PROT 7.7 7.0  ALBUMIN 3.3* 3.1*   No results for input(s): "LIPASE", "AMYLASE" in the last 168  hours. No results for input(s): "AMMONIA" in the last 168 hours. Coagulation Profile: No results for input(s): "INR", "PROTIME" in the last 168 hours. Cardiac Enzymes: No results for input(s): "CKTOTAL", "CKMB", "CKMBINDEX", "TROPONINI" in the last 168 hours. BNP (last 3 results) No results for input(s): "PROBNP" in the last 8760 hours. HbA1C: No results for input(s): "HGBA1C" in the last 72 hours. CBG: No results for input(s): "GLUCAP" in the last 168 hours. Lipid Profile: No results for input(s): "CHOL", "HDL", "LDLCALC", "TRIG", "CHOLHDL", "LDLDIRECT" in the last 72 hours. Thyroid Function Tests: No results for input(s): "TSH", "T4TOTAL", "FREET4", "T3FREE", "THYROIDAB" in the last 72 hours. Anemia Panel: No results for input(s): "VITAMINB12", "FOLATE", "FERRITIN", "TIBC", "IRON", "RETICCTPCT" in the last 72 hours. Sepsis Labs: Recent Labs  Lab 04/09/22 1819 04/09/22 1945 04/10/22 0340  LATICACIDVEN 1.9 2.6* 1.6    Recent Results (from the past 240 hour(s))  Blood culture (routine x 2)     Status: None (Preliminary result)   Collection Time: 04/09/22  6:15 PM   Specimen: Right Antecubital; Blood  Result Value Ref Range Status   Specimen Description   Final    RIGHT ANTECUBITAL BOTTLES DRAWN AEROBIC AND ANAEROBIC   Special Requests Blood  Culture adequate volume  Final   Culture   Final    NO GROWTH < 12 HOURS Performed at [facility], [address], [place], [place] [address]    Report Status PENDING  Incomplete  Blood culture (routine x 2)     Status: None (Preliminary result)   Collection Time: 04/09/22  7:45 PM   Specimen: BLOOD LEFT FOREARM  Result Value Ref Range Status   Specimen Description   Final    BLOOD LEFT FOREARM BOTTLES DRAWN AEROBIC AND ANAEROBIC   Special Requests Blood Culture adequate volume  Final   Culture   Final    NO GROWTH < 12 HOURS Performed at [facility], [address] [place], [place] [address]    Report Status PENDING  Incomplete          Radiology Studies: CT Angio Chest PE W and/or Wo Contrast  Result Date: 04/09/2022 CLINICAL DATA:  High probability for PE.  Medullary thyroid cancer. EXAM: CT ANGIOGRAPHY CHEST WITH CONTRAST TECHNIQUE: Multidetector CT imaging of the chest was performed using the standard protocol during bolus administration of intravenous contrast. Multiplanar CT image reconstructions and MIPs were obtained to evaluate the vascular anatomy. RADIATION DOSE REDUCTION: This exam was performed according to the departmental dose-optimization program which includes automated exposure control, adjustment of the mA and/or kV according to patient size and/or use of iterative reconstruction technique. CONTRAST:  71m OMNIPAQUE IOHEXOL 350 MG/ML SOLN COMPARISON:  CT chest abdomen and pelvis 03/15/2022 FINDINGS: Cardiovascular: Satisfactory opacification of the pulmonary arteries to the segmental level. No evidence of pulmonary embolism. Normal heart size. No pericardial effusion. There are atherosclerotic calcifications of the aorta and coronary arteries. Mediastinum/Nodes: Thyroid gland is surgically absent. Enlarged high right paratracheal lymph node measuring 10 mm short axis is unchanged. No new enlarged lymph nodes. Esophagus within normal limits. Lungs/Pleura: Mild emphysema is again seen. There is minimal atelectasis in the left lung base. There is a 2 mm nodule in the left lower lobe image 6/63. This is unchanged. No new pulmonary nodules are seen. The lungs are otherwise clear. There is no pleural effusion or pneumothorax. Upper Abdomen: No acute abnormality. Musculoskeletal: T4 sclerotic lesion extending into the pedicle on the right appears unchanged. Sclerosis of the C7 vertebral body appears unchanged. Lytic lesion in the sternum with soft tissue component appears unchanged. Review of the MIP images confirms the above findings. IMPRESSION: 1. No evidence for pulmonary embolism. 2. Stable 2 mm left lower lobe  pulmonary nodule. 3. Stable enlarged high right paratracheal lymph node. 4. Stable osseous metastatic disease. Aortic Atherosclerosis (ICD10-I70.0) and Emphysema (ICD10-J43.9). Electronically Signed   By: ARonney AstersM.D.   On: 04/09/2022 20:26   DG Chest 2 View  Result Date: 04/09/2022 CLINICAL DATA:  Shortness of breath EXAM: CHEST - 2 VIEW COMPARISON:  Radiograph done on 09/16/2021, CT done on 03/15/2022 FINDINGS: Cardiac size is within normal limits. Lung fields are clear of any infiltrates or pulmonary edema. There is no pleural effusion or pneumothorax. Increase in AP diameter of chest suggests COPD. IMPRESSION: There are no signs of pulmonary edema or new focal infiltrates. Electronically Signed   By: PElmer PickerM.D.   On: 04/09/2022 18:05        Scheduled Meds:  dextromethorphan-guaiFENesin  1 tablet Oral BID   enoxaparin (LOVENOX) injection  40 mg Subcutaneous Q24H   feeding supplement (GLUCERNA SHAKE)  237 mL Oral TID BM   ferrous sulfate  325 mg Oral QODAY   insulin  aspart  0-9 Units Subcutaneous TID WC   Continuous Infusions:  sodium chloride 50 mL/hr at 04/10/22 0401          Aline August, MD [facility] 04/10/2022, 7:25 AM

## 2022-04-10 NOTE — ED Provider Notes (Signed)
[facility] EMERGENCY DEPARTMENT Provider Note   CSN: [id number] Arrival date & time: 04/09/22  1646     History Chief Complaint  Patient presents with   Weakness    Generalized pain    Kristin Wells is a 71 y.o. female.  Patient is presenting today via EMS after her daughter called an ambulance to bring her to the hospital.  She recently left [place] on Wednesday after being admitted to [facility] for concern for central cord compression as well as hypoxia.  Patient was not willing to go under nursing at this time.  Patient is now willing to go into a nursing home as she feels that she cannot take care of herself.  She reports that she has been compliant with her medication.  Patient appears unkempt.  She is alert and oriented x 3.  She reports some generalized weakness, denies any focal weakness.  She denies any numbness or tingling in her bilateral upper and lower extremities.    Weakness Associated symptoms: arthralgias and myalgias   Associated symptoms: no abdominal pain, no chest pain, no diarrhea, no dysuria, no fever, no shortness of breath and no vomiting        Home Medications Prior to Admission medications   Medication Sig Start Date End Date Taking? Authorizing Provider  dexamethasone (DECADRON) 4 MG tablet Take 4 mg by mouth See admin instructions. Take 1 tablet (46m) by mouth 4 times daily every evening with meals . Continue until you F/U with oncologist 04/06/22  Yes [provider]  levothyroxine (SYNTHROID) 100 MCG tablet Take 100 mcg by mouth daily. 01/21/22  Yes [provider]  metFORMIN (GLUCOPHAGE) 500 MG tablet Take 500 mg by mouth 2 (two) times daily with a meal. Extended release 02/22/13  Yes [provider]  albuterol (VENTOLIN HFA) 108 (90 Base) MCG/ACT inhaler Inhale into the lungs. 10/21/21   [provider]  ALPRAZolam (Duanne Moron 0.25 MG tablet Take 0.25 mg by mouth 2 (two) times  daily as needed for anxiety.    [provider]  aspirin EC 81 MG tablet Take 81 mg by mouth daily.    [provider]  blood glucose meter kit and supplies KIT Dispense based on patient and insurance preference. Use up to four times daily as directed. (FOR ICD-9 250.00, 250.01). 03/15/17   NRaylene Everts MD  BREZTRI AEROSPHERE 160-9-4.8 MCG/ACT AERO Inhale into the lungs. 08/25/20   [provider]  calcium carbonate (TUMS) 500 MG chewable tablet Chew 2 tablets (400 mg of elemental calcium total) by mouth 2 (two) times daily. 09/24/21   GArmandina Gemma MD  cetirizine (ZYRTEC) 10 MG tablet Take 10 mg by mouth daily.    [provider]  cyclobenzaprine (FLEXERIL) 10 MG tablet Take 10 mg by mouth 2 (two) times daily.    [provider]  DULoxetine (CYMBALTA) 60 MG capsule Take 60 mg by mouth daily. 12/31/21   [provider]  ergocalciferol (VITAMIN D2) 1.25 MG (50000 UT) capsule Take 50,000 Units by mouth once a week. Thursday    [provider]  furosemide (LASIX) 20 MG tablet Take 1 tablet (20 mg total) by mouth daily. 01/10/14   LLendon Colonel NP  GLUCERNA (GLUCERNA) LIQD Take 237 mLs by mouth 3 (three) times daily between meals. 06/01/17   NRaylene Everts MD  guaiFENesin (MUCINEX) 600 MG 12 hr tablet Take 1 tablet (600 mg total) by mouth 2 (two) times  daily. 01/31/17   Kathie Dike, MD  Incontinence Supply Disposable (POISE PANTILINERS) PADS 1 each by Does not apply route as needed. 06/01/17   Raylene Everts, MD  levothyroxine (SYNTHROID) 88 MCG tablet Take 1 tablet (88 mcg total) by mouth daily before breakfast. 01/27/22   Nida, Marella Chimes, MD  naloxone [facility]) nasal spray 4 mg/0.1 mL SMARTSIG:1 Both Nares Daily 11/20/21   [provider]  ondansetron (ZOFRAN-ODT) 4 MG disintegrating tablet Take 4 mg by mouth 2 (two) times daily. 01/18/22   [provider]  pantoprazole (PROTONIX) 40 MG tablet Take 40  mg by mouth daily.    [provider]  potassium chloride (K-DUR) 10 MEQ tablet Take 1 tablet (10 mEq total) by mouth daily. 08/22/16   Raylene Everts, MD  rosuvastatin (CRESTOR) 20 MG tablet Take 1 tablet (20 mg total) by mouth daily. 05/14/21   Fay Records, MD  sertraline (ZOLOFT) 50 MG tablet Take 50 mg by mouth daily. 01/18/22   [provider]  traMADol (ULTRAM) 50 MG tablet Take 1-2 tablets (50-100 mg total) by mouth every 6 (six) hours as needed for moderate pain. 09/24/21   Armandina Gemma, MD  zolpidem (AMBIEN) 5 MG tablet Take 1 tablet (5 mg total) by mouth at bedtime as needed for sleep. 02/02/22   Derek Jack, MD      Allergies    Cat hair extract and Other    Review of Systems   Review of Systems  Constitutional:  Negative for chills and fever.  Respiratory:  Negative for shortness of breath.   Cardiovascular:  Negative for chest pain.  Gastrointestinal:  Negative for abdominal pain, diarrhea and vomiting.  Genitourinary:  Negative for dysuria.  Musculoskeletal:  Positive for arthralgias and myalgias.  Neurological:  Positive for weakness.    Physical Exam Updated Vital Signs BP 111/83   Pulse (!) 111   Temp 98.1 F (36.7 C) (Oral)   Resp (!) 27   SpO2 97%  Physical Exam Vitals and nursing note reviewed. Exam conducted with a chaperone present Dellis Filbert, RN).  Constitutional:      Appearance: Normal appearance. She is not toxic-appearing.     Comments: Pleasant. Appears chronically ill/ cachetic. Her hair is matted to her head and appears dirty.   HENT:     Head: Normocephalic and atraumatic.     Mouth/Throat:     Mouth: Mucous membranes are dry.  Eyes:     General: No scleral icterus. Cardiovascular:     Rate and Rhythm: Regular rhythm. Tachycardia present.  Pulmonary:     Effort: No respiratory distress.     Comments: Mild tachypnea, but still speaking in full sentences. No resp distress, nasal flaring, tripoding, accessory muscle  use, or cyanosis present.  Abdominal:     General: Bowel sounds are normal. There is no distension.     Palpations: Abdomen is soft.     Tenderness: There is no abdominal tenderness. There is no guarding or rebound.  Musculoskeletal:        General: No deformity.     Cervical back: Normal range of motion.  Skin:    General: Skin is warm and dry.     Comments: Possible Stage 0 area on her right buttock. She had a dressing from 11/28 on her bottom. I do no appreciate any wounds. No induration or fluctuance. There is some mild erythema to the right buttock. No skin breakdown noted.   Neurological:     General:  No focal deficit present.     Mental Status: She is alert and oriented to person, place, and time. Mental status is at baseline.     Comments: Generalized weakness, nothing focal.  Sensation intact throughout.  Equal grip strength.       ED Results / Procedures / Treatments   Labs (all labs ordered are listed, but only abnormal results are displayed) Labs Reviewed  BRAIN NATRIURETIC PEPTIDE - Abnormal; Notable for the following components:      Result Value   B Natriuretic Peptide 514.0 (*)    All other components within normal limits  COMPREHENSIVE METABOLIC PANEL - Abnormal; Notable for the following components:   Sodium 133 (*)    Chloride 88 (*)    Glucose, Bld 364 (*)    BUN 63 (*)    Creatinine, Ser 1.22 (*)    Albumin 3.3 (*)    AST 50 (*)    GFR, Estimated 48 (*)    Anion gap 18 (*)    All other components within normal limits  CBC WITH DIFFERENTIAL/PLATELET - Abnormal; Notable for the following components:   WBC 13.7 (*)    RBC 5.37 (*)    RDW 18.8 (*)    Platelets 137 (*)    Neutro Abs 12.1 (*)    Lymphs Abs 0.5 (*)    Abs Immature Granulocytes 0.45 (*)    All other components within normal limits  URINALYSIS, ROUTINE W REFLEX MICROSCOPIC - Abnormal; Notable for the following components:   Glucose, UA 50 (*)    Hgb urine dipstick SMALL (*)    Protein,  ur 30 (*)    Bacteria, UA RARE (*)    All other components within normal limits  D-DIMER, QUANTITATIVE - Abnormal; Notable for the following components:   D-Dimer, Quant >20.00 (*)    All other components within normal limits  LACTIC ACID, PLASMA - Abnormal; Notable for the following components:   Lactic Acid, Venous 2.6 (*)    All other components within normal limits  BLOOD GAS, VENOUS - Abnormal; Notable for the following components:   pH, Ven 7.48 (*)    pO2, Ven <31 (*)    Bicarbonate 38.0 (*)    Acid-Base Excess 12.4 (*)    All other components within normal limits  CULTURE, BLOOD (ROUTINE X 2)  CULTURE, BLOOD (ROUTINE X 2)  LACTIC ACID, PLASMA  TROPONIN I (HIGH SENSITIVITY)  TROPONIN I (HIGH SENSITIVITY)    EKG EKG Interpretation  Date/Time:  Saturday April 09 2022 19:02:26 EST Ventricular Rate:  119 PR Interval:  136 QRS Duration: 88 QT Interval:  342 QTC Calculation: 482 R Axis:   73 Text Interpretation: Sinus tachycardia Low voltage, precordial leads Nonspecific T abnrm, anterolateral leads When compared with ECG of 02/13/16 TWI are new Confirmed by Margaretmary Eddy [phone]) on 04/09/2022 7:05:13 PM  Radiology CT Angio Chest PE W and/or Wo Contrast  Result Date: 04/09/2022 CLINICAL DATA:  High probability for PE.  Medullary thyroid cancer. EXAM: CT ANGIOGRAPHY CHEST WITH CONTRAST TECHNIQUE: Multidetector CT imaging of the chest was performed using the standard protocol during bolus administration of intravenous contrast. Multiplanar CT image reconstructions and MIPs were obtained to evaluate the vascular anatomy. RADIATION DOSE REDUCTION: This exam was performed according to the departmental dose-optimization program which includes automated exposure control, adjustment of the mA and/or kV according to patient size and/or use of iterative reconstruction technique. CONTRAST:  8m OMNIPAQUE IOHEXOL 350 MG/ML SOLN COMPARISON:  CT chest abdomen  and pelvis 03/15/2022  FINDINGS: Cardiovascular: Satisfactory opacification of the pulmonary arteries to the segmental level. No evidence of pulmonary embolism. Normal heart size. No pericardial effusion. There are atherosclerotic calcifications of the aorta and coronary arteries. Mediastinum/Nodes: Thyroid gland is surgically absent. Enlarged high right paratracheal lymph node measuring 10 mm short axis is unchanged. No new enlarged lymph nodes. Esophagus within normal limits. Lungs/Pleura: Mild emphysema is again seen. There is minimal atelectasis in the left lung base. There is a 2 mm nodule in the left lower lobe image 6/63. This is unchanged. No new pulmonary nodules are seen. The lungs are otherwise clear. There is no pleural effusion or pneumothorax. Upper Abdomen: No acute abnormality. Musculoskeletal: T4 sclerotic lesion extending into the pedicle on the right appears unchanged. Sclerosis of the C7 vertebral body appears unchanged. Lytic lesion in the sternum with soft tissue component appears unchanged. Review of the MIP images confirms the above findings. IMPRESSION: 1. No evidence for pulmonary embolism. 2. Stable 2 mm left lower lobe pulmonary nodule. 3. Stable enlarged high right paratracheal lymph node. 4. Stable osseous metastatic disease. Aortic Atherosclerosis (ICD10-I70.0) and Emphysema (ICD10-J43.9). Electronically Signed   By: Ronney Asters M.D.   On: 04/09/2022 20:26   DG Chest 2 View  Result Date: 04/09/2022 CLINICAL DATA:  Shortness of breath EXAM: CHEST - 2 VIEW COMPARISON:  Radiograph done on 09/16/2021, CT done on 03/15/2022 FINDINGS: Cardiac size is within normal limits. Lung fields are clear of any infiltrates or pulmonary edema. There is no pleural effusion or pneumothorax. Increase in AP diameter of chest suggests COPD. IMPRESSION: There are no signs of pulmonary edema or new focal infiltrates. Electronically Signed   By: Elmer Picker M.D.   On: 04/09/2022 18:05    Procedures Procedures    Medications Ordered in ED Medications  lactated ringers bolus 1,000 mL (0 mLs Intravenous Stopped 04/09/22 2024)  iohexol (OMNIPAQUE) 350 MG/ML injection 75 mL (75 mLs Intravenous Contrast Given 04/09/22 1953)    ED Course/ Medical Decision Making/ A&P                           Medical Decision Making Amount and/or Complexity of Data Reviewed Labs: ordered. Radiology: ordered.  Risk Prescription drug management. Decision regarding hospitalization.   71 year old female presents emerged department for evaluation of failure to thrive.  Differential diagnosis includes but is not limited to electrolyte abnormality, dehydration, CHF, worsening malignancy.  Vital signs that show slight hypotension at 91/74, tachycardic in the 110s.  She has a slight increase tachypnea, however is speaking in full sentences and is satting well on room air.  Physical exam as noted above.  On previous chart evaluation, patient was admitted for potential central cord syndrome and acute respiratory failure with hypoxia.  She ended up leaving [place] a few days later as she did not will be placed in care of one of her children or at a nursing home.  From reading her oncologist note after discussion with the patient's daughter, he was concerned for the patient's wellbeing and wanted her to present back to the emergency department.  I spoke with the daughter on the phone.  She reports that the patient is more amenable to being admitted to her nurse, she knows that she cannot take care of herself.  Patient agrees with this and is willing to go into a nursing home.  She does not want any hospice at this time.  Overall, patient  reports that she feels fine, just has her typical aches and pains.  She denies any chest pain or shortness of breath.  She reports that she has been compliant with her medications, however her daughter is afraid that she is not taking her medications.  Her daughter was also concerned  about a bedsore on her bottom.  Patient did have a pressure wound dressing noted on the sacrum.  Upon removal, there is possibly a stage II euro wound on the right buttock.  Please see image for additional information.  I do not see any other induration, fluctuance, or skin breakdown noted.  This is nontender to palpation.  For her central cord syndrome, it appears that they describe this as malignant stenosis.  She has already had an MRI of her brain and CT of her chest abdomen pelvis.  This shows mets to her skull and throughout her body.  Neurosurgery was already consulted during her inpatient stay.  It appears that was diagnosed as spinal cord stenosis.  She was on Dex 4q6.  It appears that they were going to do a diagnostic LP however the patient wished to defer this and follow-up with her radiation oncologist in the [facility] for palliative radiotherapy.  She denies any chest pain or any shortness of breath.  She denies any abdominal pain, nausea, or vomiting.  She overall looks unkempt however is not in any acute distress.  Will order labs and fluids given the patient's slight hypotension as well as tachycardia.  Concerning of the patient's hospital stay as well as her diffuse malignancy for potential pulmonary embolism.  I independently reviewed and interpreted the patient's labs.  D-dimer greater than 20.  CBC shows slight elevation in leukocytosis at 13.7 with a left shift.  Mild decrease in lymphocyte count as well.  No anemia.  Slight decrease in platelets at 137.  Patient does not have a history from her previous lab draws of thrombocytopenia.  Urinalysis shows 50 glucose with normal hemoglobin.  There is 30 protein and rare bacteria although no white blood cells, nitrites, or leukocytes.  Blood cultures are pending.  VBG shows a pH of 7.48.  Troponin at 15 with repeat of 15.  CMP shows mildly decreased sodium at 133 but elevated glucose at 364.  Chloride 88.  Patient has a new creatinine of  1.22.Patient's creatinine was 0.97 on 04-06-2022.  Anion gap at 18.  Possibly due to dehydration due to patient's failure to thrive and not able to care for self at home.  BNP of 514.  This is a new finding although patient does have a history of CHF.  Lactic acid initially at 1.9 with repeat of 2.6.  CT imaging shows  1. No evidence for pulmonary embolism. 2. Stable 2 mm left lower lobe pulmonary nodule. 3. Stable enlarged high right paratracheal lymph node. 4. Stable osseous metastatic disease. Aortic Atherosclerosis  She was given around 700 mL of IV fluids for her slight hypotensive and tachycardia.  Patient still remains mildly tachycardic, however her blood pressure has improved with the last one being 111/83.  She was placed on 2 L of nasal cannula while resting as her oxygen was dropping while laying down and sleeping.  Potential obstructive sleep apnea.  Patient has generalized weakness.  She does not have any focal deficit.  Equal grip strength.  Sensation intact throughout.  It appears the patient was diagnosed with malignant spinal stenosis.  It appears that neurosurgery was paged and recommended palliative rad/onc.  She has already had MRIs.  On re-evaluation, the patient is sleeping comfortably in the stretcher with equal chest rise in no acute distress.   This patient presented with matted hair to her scalp.  She has dry mucous membranes.  I am concerned for her going home to take care of herself and her acute condition.  She has an elevated creatinine in the setting of an AKI, likely due to her decreased fluid intake.  She still has tachycardia with softer blood pressures.  She also has new thrombocytopenia.  I believe this patient could benefit from inpatient evaluation as well as gentle rehydration and SNF placement.  Patient is amenable to admission.  Will admit to Dr. Josephine Cables.  I discussed this case with my attending physician who cosigned this note including patient's presenting  symptoms, physical exam, and planned diagnostics and interventions. Attending physician stated agreement with plan or made changes to plan which were implemented.   Attending physician assessed patient at bedside.  Final Clinical Impression(s) / ED Diagnoses Final diagnoses:  AKI (acute kidney injury) ([place])  Failure to thrive in adult  Thrombocytopenia [facility])    Rx / DC Orders ED Discharge Orders     None         Sherrell Puller, PA-C 04/10/22 0132    Fransico Meadow, MD 04/10/22 [phone]

## 2022-04-10 NOTE — ED Notes (Signed)
Pt given breakfast tray

## 2022-04-11 ENCOUNTER — Ambulatory Visit: Payer: Self-pay | Admitting: *Deleted

## 2022-04-11 DIAGNOSIS — J449 Chronic obstructive pulmonary disease, unspecified: Secondary | ICD-10-CM

## 2022-04-11 DIAGNOSIS — C73 Malignant neoplasm of thyroid gland: Secondary | ICD-10-CM | POA: Diagnosis not present

## 2022-04-11 DIAGNOSIS — R531 Weakness: Secondary | ICD-10-CM | POA: Diagnosis not present

## 2022-04-11 DIAGNOSIS — Z7189 Other specified counseling: Secondary | ICD-10-CM | POA: Diagnosis not present

## 2022-04-11 DIAGNOSIS — Z515 Encounter for palliative care: Secondary | ICD-10-CM

## 2022-04-11 DIAGNOSIS — R627 Adult failure to thrive: Secondary | ICD-10-CM | POA: Diagnosis not present

## 2022-04-11 DIAGNOSIS — N1831 Chronic kidney disease, stage 3a: Secondary | ICD-10-CM | POA: Diagnosis not present

## 2022-04-11 LAB — BASIC METABOLIC PANEL
Anion gap: 15 (ref 5–15)
BUN: 63 mg/dL — ABNORMAL HIGH (ref 8–23)
CO2: 27 mmol/L (ref 22–32)
Calcium: 9.1 mg/dL (ref 8.9–10.3)
Chloride: 90 mmol/L — ABNORMAL LOW (ref 98–111)
Creatinine, Ser: 1.08 mg/dL — ABNORMAL HIGH (ref 0.44–1.00)
GFR, Estimated: 55 mL/min — ABNORMAL LOW (ref >60)
Glucose, Bld: 217 mg/dL — ABNORMAL HIGH (ref 70–99)
Potassium: 3.7 mmol/L (ref 3.5–5.1)
Sodium: 132 mmol/L — ABNORMAL LOW (ref 135–145)

## 2022-04-11 LAB — CBC WITH DIFFERENTIAL/PLATELET
Abs Immature Granulocytes: 0.18 10*3/uL — ABNORMAL HIGH (ref 0.00–0.07)
Basophils Absolute: 0 10*3/uL (ref 0.0–0.1)
Basophils Relative: 0 %
Eosinophils Absolute: 0 10*3/uL (ref 0.0–0.5)
Eosinophils Relative: 0 %
HCT: 35.6 % — ABNORMAL LOW (ref 36.0–46.0)
Hemoglobin: 11.6 g/dL — ABNORMAL LOW (ref 12.0–15.0)
Immature Granulocytes: 2 %
Lymphocytes Relative: 3 %
Lymphs Abs: 0.3 10*3/uL — ABNORMAL LOW (ref 0.7–4.0)
MCH: 26.7 pg (ref 26.0–34.0)
MCHC: 32.6 g/dL (ref 30.0–36.0)
MCV: 81.8 fL (ref 80.0–100.0)
Monocytes Absolute: 0.3 10*3/uL (ref 0.1–1.0)
Monocytes Relative: 3 %
Neutro Abs: 9.1 10*3/uL — ABNORMAL HIGH (ref 1.7–7.7)
Neutrophils Relative %: 92 %
Platelets: 110 10*3/uL — ABNORMAL LOW (ref 150–400)
RBC: 4.35 MIL/uL (ref 3.87–5.11)
RDW: 18.1 % — ABNORMAL HIGH (ref 11.5–15.5)
WBC: 10 10*3/uL (ref 4.0–10.5)
nRBC: 0 % (ref 0.0–0.2)

## 2022-04-11 LAB — HEMOGLOBIN A1C
Hgb A1c MFr Bld: 8 % — ABNORMAL HIGH (ref 4.8–5.6)
Hgb A1c MFr Bld: 8 % — ABNORMAL HIGH (ref 4.8–5.6)
Mean Plasma Glucose: 183 mg/dL
Mean Plasma Glucose: 183 mg/dL

## 2022-04-11 LAB — MAGNESIUM: Magnesium: 2.5 mg/dL — ABNORMAL HIGH (ref 1.7–2.4)

## 2022-04-11 LAB — GLUCOSE, CAPILLARY
Glucose-Capillary: 199 mg/dL — ABNORMAL HIGH (ref 70–99)
Glucose-Capillary: 425 mg/dL — ABNORMAL HIGH (ref 70–99)
Glucose-Capillary: 416 mg/dL — ABNORMAL HIGH (ref 70–99)
Glucose-Capillary: 85 mg/dL (ref 70–99)

## 2022-04-11 LAB — GLUCOSE, RANDOM
Glucose, Bld: 412 mg/dL — ABNORMAL HIGH (ref 70–99)
Glucose, Bld: 371 mg/dL — ABNORMAL HIGH (ref 70–99)

## 2022-04-11 MED ORDER — INSULIN ASPART 100 UNIT/ML IJ SOLN
22.0000 [IU] | Freq: Once | INTRAMUSCULAR | Status: AC
  Administered 2022-04-11: 22 [IU] via SUBCUTANEOUS

## 2022-04-11 MED ORDER — IPRATROPIUM-ALBUTEROL 0.5-2.5 (3) MG/3ML IN SOLN
3.0000 mL | Freq: Three times a day (TID) | RESPIRATORY_TRACT | Status: DC
  Administered 2022-04-11 – 2022-04-14 (×9): 3 mL via RESPIRATORY_TRACT
  Filled 2022-04-11 (×9): qty 3

## 2022-04-11 MED ORDER — INSULIN GLARGINE-YFGN 100 UNIT/ML ~~LOC~~ SOLN
15.0000 [IU] | Freq: Two times a day (BID) | SUBCUTANEOUS | Status: DC
  Administered 2022-04-11 – 2022-04-12 (×2): 15 [IU] via SUBCUTANEOUS
  Filled 2022-04-11 (×4): qty 0.15

## 2022-04-11 MED ORDER — PROSOURCE PLUS PO LIQD
30.0000 mL | Freq: Two times a day (BID) | ORAL | Status: DC
  Administered 2022-04-11 – 2022-04-14 (×6): 30 mL via ORAL
  Filled 2022-04-11 (×6): qty 30

## 2022-04-11 MED ORDER — IPRATROPIUM-ALBUTEROL 0.5-2.5 (3) MG/3ML IN SOLN
3.0000 mL | RESPIRATORY_TRACT | Status: DC
  Administered 2022-04-11: 3 mL via RESPIRATORY_TRACT
  Filled 2022-04-11: qty 3

## 2022-04-11 MED ORDER — ADULT MULTIVITAMIN W/MINERALS CH
1.0000 | ORAL_TABLET | Freq: Every day | ORAL | Status: DC
  Administered 2022-04-11 – 2022-04-14 (×4): 1 via ORAL
  Filled 2022-04-11 (×4): qty 1

## 2022-04-11 NOTE — NC FL2 (Signed)
[place] LEVEL OF CARE FORM     IDENTIFICATION  Patient Name: Kristin Wells Birthdate: [birth year] Sex: female Admission Date (Current Location): 04/09/2022  [facility] and [place] Number:  [employer] and Address:  [facility] [address] [place]      Provider Number: [id number]  Attending Physician Name and Address:  Aline August, MD  Relative Name and Phone Number:       Current Level of Care: Hospital Recommended Level of Care: [place] Prior Approval Number:    Date Approved/Denied:   PASRR Number: [id number]  Discharge Plan: SNF    Current Diagnoses: Patient Active Problem List   Diagnosis Date Noted   Generalized weakness 04/10/2022   Failure to thrive in adult 04/10/2022   Iron deficiency anemia 04/10/2022   Chronic kidney disease, stage 3a ([place]) 04/10/2022   Elevated brain natriuretic peptide (BNP) level 04/10/2022   Elevated d-dimer 04/10/2022   Lactic acidosis 04/10/2022   GERD (gastroesophageal reflux disease) 04/10/2022   Hypoalbuminemia due to protein-calorie malnutrition ([place]) 04/10/2022   Genetic testing 12/29/2021   Hypothyroidism due to Hashimoto's thyroiditis 03/26/2021   Medullary thyroid carcinoma ([place]) 03/26/2021   Thyroid nodule 03/03/2021   Chronic obstructive pulmonary disease with (acute) exacerbation (HCC)    Mixed hyperlipidemia    Major depressive disorder, single episode, unspecified    Nicotine dependence, unspecified, uncomplicated    Overactive bladder    Anxiety disorder, unspecified    Atherosclerotic heart disease of native coronary artery with unspecified angina pectoris (HCC)    Chronic obstructive pulmonary disease, unspecified (HCC)    Edema, unspecified    Hypertensive heart disease without heart failure    Postsurgical hypothyroidism    Neuralgia and neuritis, unspecified    Other acute osteomyelitis, unspecified ankle and foot ([place])    Type 2  diabetes mellitus with diabetic peripheral angiopathy without gangrene ([place])    Type 2 diabetes mellitus with hyperglycemia ([place])    Unspecified convulsions ([place])    COPD exacerbation ([place]) 01/30/2017   Onychomycosis 05/28/2014   Diabetic foot ulcer ([place]) 04/30/2014   Rectocele 10/09/2012   Diabetes mellitus without complication ([place]) 32/95/1884   Chronic respiratory failure with hypoxia ([place]) 10/04/2012   CAD (coronary artery disease) 16/60/6301   Systolic CHF, chronic ([place]) 02/21/2012   Chronic back pain 02/20/2012   Hypercapnia 07/30/2011   HTN (hypertension) 07/11/2011   Current smoker 07/11/2011   Anxiety disorder 07/11/2011   Goiter 06/28/2011   Ischemic cardiomyopathy 06/28/2011   Depression 02/04/2010   COPD with emphysema ([place]) 02/04/2010    Orientation RESPIRATION BLADDER Height & Weight     Self, Situation, Place  O2 (4L) Incontinent Weight: 91 lb 14.9 oz (41.7 kg) Height:  5' (152.4 cm)  BEHAVIORAL SYMPTOMS/MOOD NEUROLOGICAL BOWEL NUTRITION STATUS      Continent Diet (See D/C summary)  AMBULATORY STATUS COMMUNICATION OF NEEDS Skin   Extensive Assist Verbally Normal                       Personal Care Assistance Level of Assistance  Bathing, Feeding, Dressing Bathing Assistance: Limited assistance Feeding assistance: Independent Dressing Assistance: Limited assistance     Functional Limitations Info  Sight, Hearing, Speech Sight Info: Adequate Hearing Info: Adequate Speech Info: Adequate    SPECIAL CARE FACTORS FREQUENCY  PT (By licensed PT), OT (By licensed OT)     PT Frequency: 5 times weekly OT Frequency: 5 times weekly  Contractures Contractures Info: Not present    Additional Factors Info  Code Status, Allergies Code Status Info: DNR Allergies Info: cat hair extract and some perfume, cats and dogs           Current Medications (04/11/2022):  This is the current hospital active medication list Current  Facility-Administered Medications  Medication Dose Route Frequency Provider Last Rate Last Admin   acetaminophen (TYLENOL) tablet 650 mg  650 mg Oral Q6H PRN Adefeso, Oladapo, DO   650 mg at 04/10/22 2019   Or   acetaminophen (TYLENOL) suppository 650 mg  650 mg Rectal Q6H PRN Adefeso, Oladapo, DO       albuterol (PROVENTIL) (2.5 MG/3ML) 0.083% nebulizer solution 2.5 mg  2.5 mg Inhalation Q6H PRN Adefeso, Oladapo, DO       ALPRAZolam Duanne Moron) tablet 0.25 mg  0.25 mg Oral BID PRN Aline August, MD   0.25 mg at 04/10/22 2018   budesonide (PULMICORT) nebulizer solution 0.5 mg  0.5 mg Nebulization BID Aline August, MD   0.5 mg at 04/11/22 0725   dextromethorphan-guaiFENesin ([place] DM) 30-600 MG per 12 hr tablet 1 tablet  1 tablet Oral BID Adefeso, Oladapo, DO   1 tablet at 04/11/22 1015   DULoxetine (CYMBALTA) DR capsule 60 mg  60 mg Oral Daily Adefeso, Oladapo, DO   60 mg at 04/11/22 1021   enoxaparin (LOVENOX) injection 40 mg  40 mg Subcutaneous Q24H Adefeso, Oladapo, DO   40 mg at 04/11/22 1020   feeding supplement (GLUCERNA SHAKE) (GLUCERNA SHAKE) liquid 237 mL  237 mL Oral TID BM Adefeso, Oladapo, DO   237 mL at 04/11/22 1019   ferrous sulfate tablet 325 mg  325 mg Oral QODAY Adefeso, Oladapo, DO   325 mg at 04/10/22 1218   insulin aspart (novoLOG) injection 0-20 Units  0-20 Units Subcutaneous TID WC Alekh, Kshitiz, MD   4 Units at 04/11/22 0900   insulin aspart (novoLOG) injection 0-5 Units  0-5 Units Subcutaneous QHS Aline August, MD   2 Units at 04/10/22 2230   insulin glargine-yfgn (SEMGLEE) injection 15 Units  15 Units Subcutaneous BID Alekh, Kshitiz, MD       ipratropium-albuterol (DUONEB) 0.5-2.5 (3) MG/3ML nebulizer solution 3 mL  3 mL Nebulization TID Aline August, MD       levothyroxine (SYNTHROID) tablet 100 mcg  100 mcg Oral Daily Adefeso, Oladapo, DO   100 mcg at 04/11/22 0539   methylPREDNISolone sodium succinate (SOLU-MEDROL) 40 mg/mL injection 40 mg  40 mg Intravenous Q12H  Alekh, Kshitiz, MD   40 mg at 04/11/22 1021   ondansetron (ZOFRAN) tablet 4 mg  4 mg Oral Q6H PRN Adefeso, Oladapo, DO       Or   ondansetron (ZOFRAN) injection 4 mg  4 mg Intravenous Q6H PRN Adefeso, Oladapo, DO       pantoprazole (PROTONIX) EC tablet 40 mg  40 mg Oral Daily Adefeso, Oladapo, DO   40 mg at 04/11/22 1015   rosuvastatin (CRESTOR) tablet 20 mg  20 mg Oral Daily Adefeso, Oladapo, DO   20 mg at 04/11/22 1015   traMADol (ULTRAM) tablet 50 mg  50 mg Oral Q6H PRN Aline August, MD   50 mg at 04/10/22 1826     Discharge Medications: Please see discharge summary for a list of discharge medications.  Relevant Imaging Results:  Relevant Lab Results:   Additional Information SSN: [id number] [place]

## 2022-04-11 NOTE — Evaluation (Signed)
Occupational Therapy Evaluation Patient Details Name: Kristin Wells MRN: [id number] DOB: 1950-08-09 Today's Date: 04/11/2022   History of Present Illness 71 y.o. F admitted on 04/09/22 due to increased weakness and unable to care for herself. Pt recently at [facility] due to concerns for cord compression and hypoxia with recommendations for SNF, however left AMA due to not wanting to go to a SNF. PMH significant for hypertension, hyperlipidemia, type 2 diabetes mellitus, COPD on supplemental oxygen at 2 LPM, stage IV medullary thyroid cancer (s/p total thyroidectomy in 09/2021 with bone metastasis), depression.   Clinical Impression   Pt admitted for concerns listed above. PTA pt reported that she was having increased falls and weakness at home, limiting her ability to care for herself without assist. At this time, pt presents with increased weakness, balance deficits, increased O2 needs, and decreased endurance. Overall she is requiring mod A for transfers and standing ADL's. Recommending SNF to maximize her independence and safety. OT Will follow acutely.       Recommendations for follow up therapy are one component of a multi-disciplinary discharge planning process, led by the attending physician.  Recommendations may be updated based on patient status, additional functional criteria and insurance authorization.   Follow Up Recommendations  Skilled nursing-short term rehab (<3 hours/day)     Assistance Recommended at Discharge Frequent or constant Supervision/Assistance  Patient can return home with the following A lot of help with walking and/or transfers;A lot of help with bathing/dressing/bathroom;Assistance with cooking/housework;Help with stairs or ramp for entrance    Functional Status Assessment  Patient has had a recent decline in their functional status and demonstrates the ability to make significant improvements in function in a reasonable and predictable amount of time.   Equipment Recommendations  None recommended by OT    Recommendations for Other Services       Precautions / Restrictions Precautions Precautions: Fall Restrictions Weight Bearing Restrictions: No      Mobility Bed Mobility Overal bed mobility: Modified Independent             General bed mobility comments: No assist to come to sitting EOB, increased time and use of bed rails    Transfers Overall transfer level: Needs assistance Equipment used: 1 person hand held assist Transfers: Sit to/from Stand, Bed to chair/wheelchair/BSC Sit to Stand: Mod assist     Step pivot transfers: Mod assist     General transfer comment: REquiring mod A to power up to standing and      Balance Overall balance assessment: Needs assistance Sitting-balance support: Feet supported, No upper extremity supported Sitting balance-Leahy Scale: Good     Standing balance support: Bilateral upper extremity supported, Reliant on assistive device for balance Standing balance-Leahy Scale: Poor                             ADL either performed or assessed with clinical judgement   ADL Overall ADL's : Needs assistance/impaired Eating/Feeding: Independent;Sitting   Grooming: Set up;Sitting   Upper Body Bathing: Set up;Sitting   Lower Body Bathing: Moderate assistance;Sitting/lateral leans;Sit to/from stand   Upper Body Dressing : Set up;Sitting   Lower Body Dressing: Moderate assistance;Sitting/lateral leans;Sit to/from stand   Toilet Transfer: Moderate assistance;Stand-pivot   Toileting- Clothing Manipulation and Hygiene: Moderate assistance;Sitting/lateral lean;Sit to/from stand       Functional mobility during ADLs: Moderate assistance General ADL Comments: Overall requiring assist for transfers and standing ADL's  Vision Baseline Vision/History: 0 No visual deficits Ability to See in Adequate Light: 0 Adequate Patient Visual Report: No change from  baseline Vision Assessment?: No apparent visual deficits     Perception Perception Perception Tested?: No   Praxis Praxis Praxis tested?: Not tested    Pertinent Vitals/Pain Pain Assessment Pain Assessment: No/denies pain     Hand Dominance Right   Extremity/Trunk Assessment Upper Extremity Assessment Upper Extremity Assessment: Generalized weakness   Lower Extremity Assessment Lower Extremity Assessment: Generalized weakness   Cervical / Trunk Assessment Cervical / Trunk Assessment: Normal   Communication Communication Communication: No difficulties   Cognition Arousal/Alertness: Awake/alert Behavior During Therapy: WFL for tasks assessed/performed Overall Cognitive Status: Within Functional Limits for tasks assessed                                       General Comments  VSS on 4L    Exercises     Shoulder Instructions      Home Living Family/patient expects to be discharged to:: Private residence Living Arrangements: Alone Available Help at Discharge: Family;Available PRN/intermittently Type of Home: Apartment Home Access: Elevator     Home Layout: One level     Bathroom Shower/Tub: Teacher, early years/pre: Standard Bathroom Accessibility: Yes How Accessible: Accessible via walker Home Equipment: Rollator (4 wheels);BSC/3in1;Shower seat;Toilet riser;Grab bars - tub/shower          Prior Functioning/Environment Prior Level of Function : Needs assist             Mobility Comments: household ambulator using RW ADLs Comments: Per daughter pt recently unable to care forself at home        OT Problem List: Decreased strength;Decreased activity tolerance;Impaired balance (sitting and/or standing);Decreased safety awareness;Cardiopulmonary status limiting activity;Impaired UE functional use      OT Treatment/Interventions: Self-care/ADL training;Therapeutic exercise;Energy conservation;DME and/or AE  instruction;Therapeutic activities;Patient/family education;Balance training    OT Goals(Current goals can be found in the care plan section) Acute Rehab OT Goals Patient Stated Goal: To get her strength back OT Goal Formulation: With patient Time For Goal Achievement: 04/25/22 Potential to Achieve Goals: Good ADL Goals Pt Will Perform Grooming: with supervision;standing Pt Will Perform Lower Body Bathing: with min guard assist;sitting/lateral leans;sit to/from stand Pt Will Perform Lower Body Dressing: with min guard assist;sit to/from stand;sitting/lateral leans Pt Will Transfer to Toilet: with supervision;ambulating Pt Will Perform Toileting - Clothing Manipulation and hygiene: with min guard assist;sitting/lateral leans;sit to/from stand  OT Frequency: Min 1X/week    Co-evaluation              AM-PAC OT "6 Clicks" Daily Activity     Outcome Measure Help from another person eating meals?: None Help from another person taking care of personal grooming?: A Little Help from another person toileting, which includes using toliet, bedpan, or urinal?: A Lot Help from another person bathing (including washing, rinsing, drying)?: A Lot Help from another person to put on and taking off regular upper body clothing?: A Little Help from another person to put on and taking off regular lower body clothing?: A Lot 6 Click Score: 16   End of Session Equipment Utilized During Treatment: Gait belt;Oxygen Nurse Communication: Mobility status  Activity Tolerance: Patient tolerated treatment well Patient left: in chair;with call bell/phone within reach  OT Visit Diagnosis: Unsteadiness on feet (R26.81);Other abnormalities of gait and mobility (R26.89);Muscle weakness (generalized) (M62.81)  Time: 7207-2182 OT Time Calculation (min): 18 min Charges:  OT General Charges $OT Visit: 1 Visit OT Evaluation $OT Eval Low Complexity: [place], OTR/L [place] 04/11/2022, 9:35 AM

## 2022-04-11 NOTE — Progress Notes (Addendum)
Initial Nutrition Assessment  DOCUMENTATION CODES:   Underweight, Severe malnutrition in context of chronic illness  INTERVENTION:  Glucerna Shake po TID, each supplement provides 220 kcal and 10 grams of protein   ProSource Plus 30 ml TID (each 30 ml provides 100 kcal, 15 gr protein)   Request re-weight   NUTRITION DIAGNOSIS:   Severe Malnutrition related to chronic illness as evidenced by per patient/family report, severe muscle depletion, moderate fat depletion, moderate muscle depletion.  GOAL:  Patient will meet greater than or equal to 90% of their needs (as feasible given her stage 4 metastatic cancer)  MONITOR:  Supplement acceptance, PO intake, Weight trends, Labs, Skin  REASON FOR ASSESSMENT:   Consult Assessment of nutrition requirement/status  ASSESSMENT: patient is a 71 yo female with history of HTN, DM2, CHF, COPD with (02'@2L'$ ) and stage 4 medullary thyroid cancer (09/2021- s/p thyroidectomy) with bone metastasis. Presents with weakness and failure to thrive.  Palliative and Oncology consulted.   Patient tray arrived. RD provided set-up. Patients says she eats slowly due to swallow problem. Soft diet currently. Able to feed herself.   Usual body weight reported at 99 lb by patient. Current weight 41.7 kg (92 lb). Weight at Dr office 11/22 wt 100 lb. Will request re-weight.  Medications: ferrous sulfate, insulin, Protonix.      Latest Ref Rng & Units 04/11/2022    3:46 AM 04/10/2022    3:43 AM 04/09/2022    6:19 PM  BMP  Glucose 70 - 99 mg/dL 217  282  364   BUN 8 - 23 mg/dL 63  56  63   Creatinine 0.44 - 1.00 mg/dL 1.08  1.16  1.22   Sodium 135 - 145 mmol/L 132  132  133   Potassium 3.5 - 5.1 mmol/L 3.7  3.9  3.7   Chloride 98 - 111 mmol/L 90  89  88   CO2 22 - 32 mmol/L '27  28  27   '$ Calcium 8.9 - 10.3 mg/dL 9.1  9.1  9.5       NUTRITION - FOCUSED PHYSICAL EXAM:  Flowsheet Row Most Recent Value  Orbital Region Mild depletion  Upper Arm Region Severe  depletion  Thoracic and Lumbar Region Moderate depletion  Buccal Region Severe depletion  Temple Region Mild depletion  Clavicle Bone Region Moderate depletion  Clavicle and Acromion Bone Region Severe depletion  Scapular Bone Region Mild depletion  Dorsal Hand Severe depletion  Patellar Region Severe depletion  Anterior Thigh Region Severe depletion  Posterior Calf Region Moderate depletion  Edema (RD Assessment) Moderate  Hair Reviewed  Eyes Reviewed  Mouth Reviewed  Skin Reviewed  Nails Reviewed      Diet Order:   Diet Order             DIET SOFT Room service appropriate? Yes; Fluid consistency: Thin  Diet effective now                   EDUCATION NEEDS:  Education needs have been addressed  Skin:  Skin Assessment: Reviewed RN Assessment  Last BM:  12/4 type 2  Height:   Ht Readings from Last 1 Encounters:  04/10/22 5' (1.524 m)    Weight:   Wt Readings from Last 1 Encounters:  04/10/22 41.7 kg    Ideal Body Weight:   45 kg  BMI:  Body mass index is 17.95 kg/m.  Estimated Nutritional Needs:   Kcal:  1450-1600  Protein:  59-67 gr  Fluid:  >  1400 ml/daily  Colman Cater MS,RD,CSG,LDN Contact: Shea Evans

## 2022-04-11 NOTE — Progress Notes (Signed)
PROGRESS NOTE    Kristin Wells  PZW:[id number] DOB: 08-21-1950 DOA: 04/09/2022 PCP: Celene Squibb, MD   Brief Narrative:  71 year old female with medical history significant of hypertension, hyperlipidemia, type 2 diabetes mellitus, COPD on supplemental oxygen at 2 LPM, stage IV medullary thyroid cancer (s/p total thyroidectomy in 09/2021 with bone metastasis), depression, recent hospitalization at [facility] due to concern for cord compression and hypoxia with recommendations for SNF placement but patient left AMA because she did not want to go to nursing home on discharge presented with generalized weakness and unable to take care of herself.  On presentation, WBC was 13.7, D-dimer was more than 20, lactic acid 2.6, CTA chest was negative for PE and showed stable osseous metastatic disease and stable 2 mm left lower lobe pulmonary nodule.  She was started on IV fluids.  Palliative care and oncology were consulted.  Assessment & Plan:   Generalized weakness and failure to thrive Stage IV medullary cancer status post total thyroidectomy with bony metastases with recent diagnosis of cord compression treated with Decadron Physical deconditioning Goals of care -She was recently hospitalized at [facility] due to concern for cord compression and hypoxia with recommendations for SNF placement but patient left AMA because she did not want to go to nursing home on discharge.  However, she  presented with generalized weakness and unable to take care of herself. -Reviewed in care everywhere notes from [facility]: There was some discussion about possibility of hospice involvement.  palliative care and oncology consults are pending. -She apparently completed course of Decadron 1 day prior to presentation -PT/OT recommend SNF placement.  TOC consulted.  Overall prognosis is poor -Nutrition/dietitian consult.  Fall precautions  Dehydration -Treated with IV fluids.   DC'd IV fluids because of worsening hypoxia  Acute on chronic diastolic heart failure -Required IV Lasix during recent hospitalization at [facility].  On oral Lasix at home.  BNP slightly elevated; chest x-ray showed no pulmonary edema.  Strict input and output.  Daily weights.  Fluid restriction.   -Of IV fluids.  Echo on May 27, 2021 had shown EF of 65 to 70% with grade 1 diastolic dysfunction -Received 1 dose of IV Lasix 40 mg on 04/10/2022.  Blood pressure on the lower side currently.  Might need another dose of Lasix if blood pressure improves.  Outpatient follow-up with cardiology.  Acute on chronic respiratory failure with hypoxia COPD -Normally wears 2 L oxygen via nasal cannula.  Currently requiring 4 L oxygen.  Wean off as able.  Diuretic plan as above. -Continue Solu-Medrol along with nebs -Incentive spirometry/flutter valve  Lactic acidosis -Possibly from all of the above.  Resolved  CKD stage IIIa -Creatinine at baseline.  Repeat a.m. labs  Hyponatremia -mild.  Encourage oral intake.  Repeat a.m. labs  Leukocytosis -No labs today  Thrombocytopenia -Questionable cause.  No signs of bleeding.  Monitor intermittently  Diabetes mellitus type 2 with hyperglycemia -Continue CBGs with SSI.  Patient was recently on Decadron as an outpatient.  Continue long-acting insulin.  Carb modified diet.  History of iron deficiency anemia -Hemoglobin stable.  Continue iron supplement  Hyperlipidemia -Continue Crestor  Depression -Continue Cymbalta  Acquired hypothyroidism -Continue Synthroid  DVT prophylaxis: Lovenox Code Status: DNR Family Communication: None at bedside Disposition Plan: Status is: inpatient because: Of severity of illness.  Need for SNF placement.  Currently more hypoxic and requiring IV steroids.  Consultants: oncology and palliative care  Procedures: None  Antimicrobials: None   Subjective: Patient seen and examined at bedside.  Poor  historian.  Feels slightly better but still extremely short of breath with minimal exertion.  No chest pain, fever, vomiting reported.   Objective: Vitals:   04/10/22 2100 04/11/22 0053 04/11/22 0540 04/11/22 0725  BP: (!) 97/58  93/70   Pulse: 90  (!) 101   Resp:   18   Temp:   98.8 F (37.1 C)   TempSrc:   Oral   SpO2:  95% 97% 98%  Weight:      Height:        Intake/Output Summary (Last 24 hours) at 04/11/2022 0800 Last data filed at 04/10/2022 2000 Gross per 24 hour  Intake 240 ml  Output --  Net 240 ml    Filed Weights   04/10/22 1311  Weight: 41.7 kg    Examination:  General: On 4 L oxygen via nasal cannula.  No distress.  Looks chronically ill and deconditioned.  Extremely thinly built. ENT/neck: No thyromegaly.  JVD is not elevated  respiratory: Decreased breath sounds at bases bilaterally with some crackles; no wheezing  CVS: S1-S2 heard, mild intermittent tachycardia present Abdominal: Soft, nontender, slightly distended; no organomegaly, normal bowel sounds are heard Extremities: Trace lower extremity edema; no cyanosis  CNS: Awake and alert. Poor historian. No focal neurologic deficit.  Moves extremities Lymph: No obvious lymphadenopathy Skin: No obvious ecchymosis/lesions  psych: Not agitated.  Intermittently smiling musculoskeletal: No obvious joint swelling/deformity     Data Reviewed: I have personally reviewed following labs and imaging studies  CBC: Recent Labs  Lab 04/09/22 1819 04/10/22 0343  WBC 13.7* 12.0*  NEUTROABS 12.1*  --   HGB 14.0 12.9  HCT 43.6 40.0  MCV 81.2 81.3  PLT 137* 122*    Basic Metabolic Panel: Recent Labs  Lab 04/09/22 1819 04/10/22 0343 04/11/22 0346  NA 133* 132* 132*  K 3.7 3.9 3.7  CL 88* 89* 90*  CO2 '27 28 27  '$ GLUCOSE 364* 282* 217*  BUN 63* 56* 63*  CREATININE 1.22* 1.16* 1.08*  CALCIUM 9.5 9.1 9.1  MG  --  2.4 2.5*  PHOS  --  4.3  --     GFR: Estimated Creatinine Clearance: 31.9 mL/min (A) (by  C-G formula based on SCr of 1.08 mg/dL (H)). Liver Function Tests: Recent Labs  Lab 04/09/22 1819 04/10/22 0343  AST 50* 48*  ALT 15 12  ALKPHOS 117 114  BILITOT 1.1 1.0  PROT 7.7 7.0  ALBUMIN 3.3* 3.1*    No results for input(s): "LIPASE", "AMYLASE" in the last 168 hours. No results for input(s): "AMMONIA" in the last 168 hours. Coagulation Profile: No results for input(s): "INR", "PROTIME" in the last 168 hours. Cardiac Enzymes: No results for input(s): "CKTOTAL", "CKMB", "CKMBINDEX", "TROPONINI" in the last 168 hours. BNP (last 3 results) No results for input(s): "PROBNP" in the last 8760 hours. HbA1C: No results for input(s): "HGBA1C" in the last 72 hours. CBG: Recent Labs  Lab 04/10/22 1233 04/10/22 1316 04/10/22 1719 04/10/22 2053 04/11/22 0730  GLUCAP 220* 233* 108* 201* 199*   Lipid Profile: No results for input(s): "CHOL", "HDL", "LDLCALC", "TRIG", "CHOLHDL", "LDLDIRECT" in the last 72 hours. Thyroid Function Tests: No results for input(s): "TSH", "T4TOTAL", "FREET4", "T3FREE", "THYROIDAB" in the last 72 hours. Anemia Panel: No results for input(s): "VITAMINB12", "FOLATE", "FERRITIN", "TIBC", "IRON", "RETICCTPCT" in the last 72 hours. Sepsis Labs: Recent Labs  Lab 04/09/22 1819 04/09/22 1945 04/10/22  0340 04/10/22 0550  LATICACIDVEN 1.9 2.6* 1.6 1.3     Recent Results (from the past 240 hour(s))  Blood culture (routine x 2)     Status: None (Preliminary result)   Collection Time: 04/09/22  6:15 PM   Specimen: Right Antecubital; Blood  Result Value Ref Range Status   Specimen Description   Final    RIGHT ANTECUBITAL BOTTLES DRAWN AEROBIC AND ANAEROBIC   Special Requests Blood Culture adequate volume  Final   Culture   Final    NO GROWTH 2 DAYS Performed at [facility], [address], [place], [place] [address]    Report Status PENDING  Incomplete  Blood culture (routine x 2)     Status: None (Preliminary result)   Collection Time: 04/09/22   7:45 PM   Specimen: BLOOD LEFT FOREARM  Result Value Ref Range Status   Specimen Description   Final    BLOOD LEFT FOREARM BOTTLES DRAWN AEROBIC AND ANAEROBIC   Special Requests Blood Culture adequate volume  Final   Culture   Final    NO GROWTH 2 DAYS Performed at [facility], [address], [place],  [address]    Report Status PENDING  Incomplete         Radiology Studies: CT Angio Chest PE W and/or Wo Contrast  Result Date: 04/09/2022 CLINICAL DATA:  High probability for PE.  Medullary thyroid cancer. EXAM: CT ANGIOGRAPHY CHEST WITH CONTRAST TECHNIQUE: Multidetector CT imaging of the chest was performed using the standard protocol during bolus administration of intravenous contrast. Multiplanar CT image reconstructions and MIPs were obtained to evaluate the vascular anatomy. RADIATION DOSE REDUCTION: This exam was performed according to the departmental dose-optimization program which includes automated exposure control, adjustment of the mA and/or kV according to patient size and/or use of iterative reconstruction technique. CONTRAST:  4m OMNIPAQUE IOHEXOL 350 MG/ML SOLN COMPARISON:  CT chest abdomen and pelvis 03/15/2022 FINDINGS: Cardiovascular: Satisfactory opacification of the pulmonary arteries to the segmental level. No evidence of pulmonary embolism. Normal heart size. No pericardial effusion. There are atherosclerotic calcifications of the aorta and coronary arteries. Mediastinum/Nodes: Thyroid gland is surgically absent. Enlarged high right paratracheal lymph node measuring 10 mm short axis is unchanged. No new enlarged lymph nodes. Esophagus within normal limits. Lungs/Pleura: Mild emphysema is again seen. There is minimal atelectasis in the left lung base. There is a 2 mm nodule in the left lower lobe image 6/63. This is unchanged. No new pulmonary nodules are seen. The lungs are otherwise clear. There is no pleural effusion or pneumothorax. Upper Abdomen: No acute  abnormality. Musculoskeletal: T4 sclerotic lesion extending into the pedicle on the right appears unchanged. Sclerosis of the C7 vertebral body appears unchanged. Lytic lesion in the sternum with soft tissue component appears unchanged. Review of the MIP images confirms the above findings. IMPRESSION: 1. No evidence for pulmonary embolism. 2. Stable 2 mm left lower lobe pulmonary nodule. 3. Stable enlarged high right paratracheal lymph node. 4. Stable osseous metastatic disease. Aortic Atherosclerosis (ICD10-I70.0) and Emphysema (ICD10-J43.9). Electronically Signed   By: ARonney AstersM.D.   On: 04/09/2022 20:26   DG Chest 2 View  Result Date: 04/09/2022 CLINICAL DATA:  Shortness of breath EXAM: CHEST - 2 VIEW COMPARISON:  Radiograph done on 09/16/2021, CT done on 03/15/2022 FINDINGS: Cardiac size is within normal limits. Lung fields are clear of any infiltrates or pulmonary edema. There is no pleural effusion or pneumothorax. Increase in AP diameter of chest suggests COPD. IMPRESSION: There  are no signs of pulmonary edema or new focal infiltrates. Electronically Signed   By: Elmer Picker M.D.   On: 04/09/2022 18:05        Scheduled Meds:  budesonide (PULMICORT) nebulizer solution  0.5 mg Nebulization BID   dextromethorphan-guaiFENesin  1 tablet Oral BID   DULoxetine  60 mg Oral Daily   enoxaparin (LOVENOX) injection  40 mg Subcutaneous Q24H   feeding supplement (GLUCERNA SHAKE)  237 mL Oral TID BM   ferrous sulfate  325 mg Oral QODAY   insulin aspart  0-20 Units Subcutaneous TID WC   insulin aspart  0-5 Units Subcutaneous QHS   insulin glargine-yfgn  10 Units Subcutaneous BID   ipratropium-albuterol  3 mL Nebulization Q4H WA   levothyroxine  100 mcg Oral Daily   methylPREDNISolone (SOLU-MEDROL) injection  40 mg Intravenous Q12H   pantoprazole  40 mg Oral Daily   rosuvastatin  20 mg Oral Daily   Continuous Infusions:          Aline August, MD [facility] 04/11/2022, 8:00 AM

## 2022-04-11 NOTE — TOC Progression Note (Addendum)
Transition of Care [facility]) - Progression Note    Patient Details  Name: TAYONA SARNOWSKI MRN: [id number] Date of Birth: 1950-10-18  [facility]) CM/SW Contact  Shade Flood, LCSW Phone Number: 04/11/2022, 1:03 PM  Clinical Narrative:     TOC following. Therapy recommending SNF rehab at dc. TOC updated pt/dtr and they are agreeable. CMS provider options reviewed and will refer as requested.  MD anticipating dc possibly Wednesday. Will start insurance auth tomorrow if it appears pt will indeed be ready for dc Wednesday.  Cleared SA consult as H&P MD states that pt reported no substance use.  Expected Discharge Plan: [place] Barriers to Discharge: Continued Medical Work up  Expected Discharge Plan and Services Expected Discharge Plan: [place] In-house Referral: Clinical Social Work Discharge Planning Services: CM Consult Post Acute Care Choice: [place] arrangements for the past 2 months: Single Family Home                                       Social Determinants of Health (SDOH) Interventions    Readmission Risk Interventions     No data to display

## 2022-04-11 NOTE — Progress Notes (Signed)
Walked into patient's room and patient's O2 was on 8L with a regular [place] line.  The water bottle on the wall was about to pop.  Went to get a [place] HFNC to put on patient and spoke with RN.  RN stated that patient was not supposed to be on that high amount of O2.  Checked sat, fingers were cold, warmed hands with mine and checked sat and patient was 100%.  Took patient back down to 3L, patient sat was 99%.  Patient stated that she was on 2L at home.  Will place patient back on 2L after breathing treatment.

## 2022-04-11 NOTE — Patient Outreach (Signed)
    04/11/22 Kristin Wells MRN [id number]    Patient noted to be hospitalized since 04/09/22 at [facility] at the time of scheduled [facility] RN CM outreach on 04/11/22  Dx generalized weakness and failure to thrive Stage IV medullary cancer status post total thyroidectomy with bony metastases with recent diagnosis of cord compression treated with Decadron Physical deconditioning  Plans Pended another attempt for 04/18/22 2 pm pending anticipated discharge to nursing facility     [place]. Lavina Hamman, RN, BSN, Lane Coordinator Office number [phone]

## 2022-04-11 NOTE — Progress Notes (Signed)
Cbg exceeds sliding scales insulin coverage.  Md notified via [place] page. Awaiting orders.

## 2022-04-11 NOTE — Consult Note (Signed)
Consultation Note Date: 04/11/2022   Patient Name: Kristin Wells  DOB: 09/21/1950  MRN: [id number]  Age / Sex: 71 y.o., female  PCP: Celene Squibb, MD Referring Physician: Aline August, MD  Reason for Consultation: [place] ?hospice appropriate  HPI/Patient Profile: 71 y.o. female  with past medical history of HTN, HLD, DM2, medullary thyroid cancer with mets to spine, recent admission to [facility] due cord compression, recommendations for SNF but left AMA, admitted on 04/09/2022 with generalized weakness, failure to thrive, acute on chronic respiratory failure d/t COPD.    Primary Decision Maker PATIENT  Discussion: Chart reviewed including labs, progress notes, imaging from this and previous encounters.  Patient was sitting up in her chair, awake, alert, oriented.  Shares she is depressed because she is bored. She enjoys drawing cartoons of Kristin Wells and reading.  She has DNR in place.  She has six children- but doesn't have a preference for who would make decisions for her. Primary contact in EPIC is listed as her daughter, Kristin Wells. Discussed GOC with patient. She is very interested in seeking radiation treatment to her spine. She notes she has had it before and understands what is entailed. Her hope is that going to SNF and receiving radiation will increase her independence so she can return home.  Spoke with her daughter Kristin Wells. Kristin Wells is in agreement with this plan.  Patient would possibly be eligible for hospice after she has completed her radiation txs if her functional status remains poor.    SUMMARY OF RECOMMENDATIONS- -Continue current plan of care -GOC are to d/c to SNF with Palliative and complete radiation to spine- hospice would be appropriate after completion of radiation treatments if her functional status remains poor -TOC consult for outpatient Palliative referral  Code  Status/Advance Care Planning: DNR   Prognosis:   Unable to determine  Discharge Planning: [place] for rehab with Palliative care service follow-up  Primary Diagnoses: Present on Admission:  Type 2 diabetes mellitus with hyperglycemia ([place])  Mixed hyperlipidemia  Depression  Chronic obstructive pulmonary disease, unspecified ([place])  Chronic respiratory failure with hypoxia (HCC)  Medullary thyroid carcinoma (HCC)   Review of Systems  Physical Exam  Vital Signs: BP (!) 88/78 (BP Location: Right Arm) Comment: Tammy, RN notified  Pulse 100   Temp 98.3 F (36.8 C) (Oral)   Resp 18   Ht 5' (1.524 m)   Wt 41.7 kg   SpO2 94%   BMI 17.95 kg/m  Pain Scale: 0-10   Pain Score: 0-No pain   SpO2: SpO2: 94 % O2 Device:SpO2: 94 % O2 Flow Rate: .O2 Flow Rate (L/min): 4 L/min  IO: Intake/output summary:  Intake/Output Summary (Last 24 hours) at 04/11/2022 1515 Last data filed at 04/11/2022 0800 Gross per 24 hour  Intake 960 ml  Output 150 ml  Net 810 ml    LBM:   Baseline Weight: Weight: 41.7 kg Most recent weight: Weight: 41.7 kg       Thank you for this  consult. Palliative medicine will continue to follow and assist as needed.  Time Total: 90 minutes Greater than 50%  of this time was spent counseling and coordinating care related to the above assessment and plan.  Signed by: Mariana Kaufman, AGNP-C Palliative Medicine    Please contact Palliative Medicine Team phone at [phone] for questions and concerns.  For individual provider: See Shea Evans

## 2022-04-12 ENCOUNTER — Ambulatory Visit: Payer: Medicare Other | Admitting: Hematology

## 2022-04-12 DIAGNOSIS — R531 Weakness: Secondary | ICD-10-CM | POA: Diagnosis not present

## 2022-04-12 DIAGNOSIS — J449 Chronic obstructive pulmonary disease, unspecified: Secondary | ICD-10-CM | POA: Diagnosis not present

## 2022-04-12 DIAGNOSIS — C73 Malignant neoplasm of thyroid gland: Secondary | ICD-10-CM | POA: Diagnosis not present

## 2022-04-12 DIAGNOSIS — J9611 Chronic respiratory failure with hypoxia: Secondary | ICD-10-CM

## 2022-04-12 LAB — BASIC METABOLIC PANEL
Anion gap: 12 (ref 5–15)
BUN: 71 mg/dL — ABNORMAL HIGH (ref 8–23)
CO2: 31 mmol/L (ref 22–32)
Calcium: 9.1 mg/dL (ref 8.9–10.3)
Chloride: 87 mmol/L — ABNORMAL LOW (ref 98–111)
Creatinine, Ser: 1.15 mg/dL — ABNORMAL HIGH (ref 0.44–1.00)
GFR, Estimated: 51 mL/min — ABNORMAL LOW (ref >60)
Glucose, Bld: 211 mg/dL — ABNORMAL HIGH (ref 70–99)
Potassium: 4.6 mmol/L (ref 3.5–5.1)
Sodium: 130 mmol/L — ABNORMAL LOW (ref 135–145)

## 2022-04-12 LAB — GLUCOSE, CAPILLARY
Glucose-Capillary: 231 mg/dL — ABNORMAL HIGH (ref 70–99)
Glucose-Capillary: 340 mg/dL — ABNORMAL HIGH (ref 70–99)
Glucose-Capillary: 59 mg/dL — ABNORMAL LOW (ref 70–99)
Glucose-Capillary: 120 mg/dL — ABNORMAL HIGH (ref 70–99)
Glucose-Capillary: 145 mg/dL — ABNORMAL HIGH (ref 70–99)

## 2022-04-12 LAB — MAGNESIUM: Magnesium: 2.4 mg/dL (ref 1.7–2.4)

## 2022-04-12 MED ORDER — DEXTROSE 50 % IV SOLN
25.0000 mL | Freq: Once | INTRAVENOUS | Status: DC
  Filled 2022-04-12: qty 50

## 2022-04-12 MED ORDER — INSULIN ASPART 100 UNIT/ML IJ SOLN
4.0000 [IU] | Freq: Three times a day (TID) | INTRAMUSCULAR | Status: DC
  Administered 2022-04-12: 4 [IU] via SUBCUTANEOUS

## 2022-04-12 MED ORDER — INSULIN GLARGINE-YFGN 100 UNIT/ML ~~LOC~~ SOLN
5.0000 [IU] | Freq: Two times a day (BID) | SUBCUTANEOUS | Status: DC
  Administered 2022-04-12 – 2022-04-14 (×4): 5 [IU] via SUBCUTANEOUS
  Filled 2022-04-12 (×6): qty 0.05

## 2022-04-12 NOTE — TOC Progression Note (Signed)
Transition of Care [facility]) - Progression Note    Patient Details  Name: Kristin Wells MRN: [id number] Date of Birth: 03/25/1951  [facility] CM/SW Contact  Shade Flood, LCSW Phone Number: 04/12/2022, 2:37 PM  Clinical Narrative:     TOC following. Reviewed SNF bed offers with pt and her daughter. Also reviewed CMS star ratings for each facility. They have selected [facility]. MD anticipating dc in 1-2 days.  Updated Bryson Ha at [facility].  TOC to start insurance auth today. Will follow.  Expected Discharge Plan: [place] Barriers to Discharge: Continued Medical Work up  Expected Discharge Plan and Services Expected Discharge Plan: [place] In-house Referral: Clinical Social Work Discharge Planning Services: CM Consult Post Acute Care Choice: [place] arrangements for the past 2 months: Single Family Home                                       Social Determinants of Health (SDOH) Interventions    Readmission Risk Interventions     No data to display

## 2022-04-12 NOTE — Inpatient Diabetes Management (Signed)
Inpatient Diabetes Program Recommendations  AACE/ADA: New Consensus Statement on Inpatient Glycemic Control (2015)  Target Ranges:  Prepandial:   less than 140 mg/dL      Peak postprandial:   less than 180 mg/dL (1-2 hours)      Critically ill patients:  140 - 180 mg/dL   Lab Results  Component Value Date   GLUCAP 231 (H) 04/12/2022   HGBA1C 8.0 (H) 04/11/2022    Review of Glycemic Control  Latest Reference Range & Units 04/11/22 11:56 04/11/22 16:36 04/11/22 21:42 04/12/22 07:05  Glucose-Capillary 70 - 99 mg/dL 425 (H) 416 (H) 85 231 (H)  (H): Data is abnormally high Diabetes history: Type 2 DM Outpatient Diabetes medications: Metformin 1000 mg BID Current orders for Inpatient glycemic control: Novolog 0-20 units TID & HS, Semglee 15 units BID Solumedrol 40 mg BID  Inpatient Diabetes Program Recommendations:    Postprandials exceeding inpatient goals due to steroids. Consider adding Novolog 4 units TID (Assuming patient is consuming >50% of meals).  Thanks, Bronson Curb, MSN, RNC-OB Diabetes Coordinator [phone] (8a-5p)

## 2022-04-12 NOTE — Progress Notes (Signed)
Pts daughter requested if she could stay the night tonight, daughter made aware that she can.

## 2022-04-12 NOTE — Progress Notes (Signed)
Occupational Therapy Treatment Patient Details Name: Kristin Wells MRN: [id number] DOB: 11-25-1950 Today's Date: 04/12/2022   History of present illness 71 y.o. F admitted on 04/09/22 due to increased weakness and unable to care for herself. Pt recently at [facility] due to concerns for cord compression and hypoxia with recommendations for SNF, however left AMA due to not wanting to go to a SNF. PMH significant for hypertension, hyperlipidemia, type 2 diabetes mellitus, COPD on supplemental oxygen at 2 LPM, stage IV medullary thyroid cancer (s/p total thyroidectomy in 09/2021 with bone metastasis), depression.   OT comments  Pt agreeable to OT treatment. Pt needing min to mod A to boost from EOB and more moderate assist from chair and toilet. Pt is generally weak with slow labored movement but once standing she is able to ambulate without direct physical assist as long as she is using the RW. Pt was able to complete peri-care with lateral leans and wash hands at the sink with min G assist. Pt is progressing well and was left in the chair with the call bell within reach. Pt will benefit from continued OT in the hospital and recommended venue below to increase strength, balance, and endurance for safe ADL's.      Recommendations for follow up therapy are one component of a multi-disciplinary discharge planning process, led by the attending physician.  Recommendations may be updated based on patient status, additional functional criteria and insurance authorization.    Follow Up Recommendations  Skilled nursing-short term rehab (<3 hours/day)     Assistance Recommended at Discharge Frequent or constant Supervision/Assistance  Patient can return home with the following  A lot of help with walking and/or transfers;A lot of help with bathing/dressing/bathroom;Assistance with cooking/housework;Help with stairs or ramp for entrance   Equipment Recommendations  None recommended by OT     Recommendations for Other Services      Precautions / Restrictions Precautions Precautions: Fall Restrictions Weight Bearing Restrictions: No       Mobility Bed Mobility Overal bed mobility: Modified Independent Bed Mobility: Supine to Sit     Supine to sit: Modified independent (Device/Increase time)     General bed mobility comments: No assist to come to sitting EOB, increased time and use of bed rails    Transfers Overall transfer level: Needs assistance Equipment used: Rolling walker (2 wheels) Transfers: Sit to/from Stand, Bed to chair/wheelchair/BSC Sit to Stand: Mod assist     Step pivot transfers: Min guard, Min assist     General transfer comment: Pt needing min to mod A to boost from EOB and more mod A to boost from chair. Once up, pt ambulates without direct physical assist using RW.     Balance Overall balance assessment: Needs assistance Sitting-balance support: Bilateral upper extremity supported, Feet supported Sitting balance-Leahy Scale: Good Sitting balance - Comments: good seated EOB   Standing balance support: Bilateral upper extremity supported, Reliant on assistive device for balance Standing balance-Leahy Scale: Fair Standing balance comment: fair using RW                           ADL either performed or assessed with clinical judgement   ADL Overall ADL's : Needs assistance/impaired     Grooming: Wash/dry face;Supervision/safety;Min guard;Standing Grooming Details (indicate cue type and reason): Standing at sink to wash hands with RW                 Toilet Transfer: Ambulation;Rolling  walker (2 wheels);Moderate assistance;Minimal assistance Toilet Transfer Details (indicate cue type and reason): Ambulated to toilet from chair and back with RW. Assisted needed to boost form toilet covered by [facility]. Toileting- Clothing Manipulation and Hygiene: Supervision/safety;Sitting/lateral lean Toileting - Clothing Manipulation  Details (indicate cue type and reason): Pt able to complete peri-care after urination on the toilet. Pt in gown but likely would needs assist to pull up pants or underwear after using the toilet.     Functional mobility during ADLs: Min guard;Rolling walker (2 wheels) General ADL Comments: Pt ambulated in room with RW and min G assist.      Cognition Arousal/Alertness: Awake/alert Behavior During Therapy: WFL for tasks assessed/performed Overall Cognitive Status: Within Functional Limits for tasks assessed                                                             Pertinent Vitals/ Pain       Pain Assessment Pain Assessment: 0-10 Pain Score: 10-Worst pain ever Pain Location: R torso Pain Descriptors / Indicators: Other (Comment) (Pt described herself as always being in pain.) Pain Intervention(s): Limited activity within patient's tolerance, Monitored during session, Repositioned                                                          Frequency  Min 1X/week        Progress Toward Goals  OT Goals(current goals can now be found in the care plan section)  Progress towards OT goals: Progressing toward goals  Acute Rehab OT Goals Patient Stated Goal: To get her strength back OT Goal Formulation: With patient Time For Goal Achievement: 04/25/22 Potential to Achieve Goals: Good ADL Goals Pt Will Perform Grooming: with supervision;standing Pt Will Perform Lower Body Bathing: with min guard assist;sitting/lateral leans;sit to/from stand Pt Will Perform Lower Body Dressing: with min guard assist;sit to/from stand;sitting/lateral leans Pt Will Transfer to Toilet: with supervision;ambulating Pt Will Perform Toileting - Clothing Manipulation and hygiene: with min guard assist;sitting/lateral leans;sit to/from stand  Plan Discharge plan remains appropriate                                    End of Session  Equipment Utilized During Treatment: Gait belt;Oxygen;Rolling walker (2 wheels)  OT Visit Diagnosis: Unsteadiness on feet (R26.81);Other abnormalities of gait and mobility (R26.89);Muscle weakness (generalized) (M62.81)   Activity Tolerance Patient tolerated treatment well   Patient Left in chair;with call bell/phone within reach   Nurse Communication Mobility status;Other (comment) (notified pt was in the chair)        Time: 1022-1050 OT Time Calculation (min): 28 min  Charges: OT General Charges $OT Visit: 1 Visit OT Treatments $Self Care/Home Management : 23-37 mins  Kashawna Manzer OT, MOT   Larey Seat 04/12/2022, 11:04 AM

## 2022-04-12 NOTE — Progress Notes (Signed)
PROGRESS NOTE    Kristin Wells  OIB:[id number] DOB: Sep 19, 1950 DOA: 04/09/2022 PCP: Celene Squibb, MD   Brief Narrative:  71 year old female with medical history significant of hypertension, hyperlipidemia, type 2 diabetes mellitus, COPD on supplemental oxygen at 2 LPM, stage IV medullary thyroid cancer (s/p total thyroidectomy in 09/2021 with bone metastasis), depression, recent hospitalization at [facility] due to concern for cord compression and hypoxia with recommendations for SNF placement but patient left AMA because she did not want to go to nursing home on discharge presented with generalized weakness and unable to take care of herself.  On presentation, WBC was 13.7, D-dimer was more than 20, lactic acid 2.6, CTA chest was negative for PE and showed stable osseous metastatic disease and stable 2 mm left lower lobe pulmonary nodule.  She was started on IV fluids.  Palliative care and oncology were consulted.  Assessment & Plan:   Generalized weakness and failure to thrive Stage IV medullary cancer status post total thyroidectomy with bony metastases with recent diagnosis of cord compression treated with Decadron Physical deconditioning Goals of care Severe malnutrition -She was recently hospitalized at [facility] due to concern for cord compression and hypoxia with recommendations for SNF placement but patient left AMA because she did not want to go to nursing home on discharge.  However, she  presented with generalized weakness and unable to take care of herself. -Reviewed in care everywhere notes from [facility]: There was some discussion about possibility of hospice involvement.  palliative care following.  Oncology evaluation is pending. -She apparently completed course of Decadron 1 day prior to presentation -PT/OT recommend SNF placement.  TOC following.  Overall prognosis is poor -Follow nutrition recommendations.  Fall  precautions  Dehydration -Treated with IV fluids.  DC'd IV fluids because of worsening hypoxia  Acute on chronic diastolic heart failure -Required IV Lasix during recent hospitalization at [facility].  On oral Lasix at home.  BNP slightly elevated; chest x-ray showed no pulmonary edema.  Strict input and output.  Daily weights.  Fluid restriction.   -Of IV fluids.  Echo on May 27, 2021 had shown EF of 65 to 70% with grade 1 diastolic dysfunction -Received 1 dose of IV Lasix 40 mg on 04/10/2022.  Blood pressure on the lower side currently.  Might need another dose of Lasix if blood pressure improves.  Outpatient follow-up with cardiology.  Acute on chronic respiratory failure with hypoxia COPD -Normally wears 2 L oxygen via nasal cannula.  Oxygen requirement improving to 2 L oxygen.  Diuretic plan as above. -Continue Solu-Medrol along with nebs -Incentive spirometry/flutter valve  Lactic acidosis -Possibly from all of the above.  Resolved  CKD stage IIIa -Creatinine at baseline.  Repeat a.m. labs  Hyponatremia -mild.  Encourage oral intake.  Repeat a.m. labs  Leukocytosis -No labs today  Thrombocytopenia -Questionable cause.  No signs of bleeding.  Monitor intermittently  Diabetes mellitus type 2 with hyperglycemia with hypoglycemia -Continue CBGs with SSI.  Patient was recently on Decadron as an outpatient.  Carb modified diet.  Continue long-acting insulin.  History of iron deficiency anemia -Hemoglobin stable.  Continue iron supplement  Hyperlipidemia -Continue Crestor  Depression -Continue Cymbalta  Acquired hypothyroidism -Continue Synthroid  DVT prophylaxis: Lovenox Code Status: DNR Family Communication: None at bedside Disposition Plan: Status is: inpatient because: Of severity of illness.  Need for SNF placement.  Possible discharge in 1 to 2 days if remains stable.  Consultants: oncology and palliative care  Procedures: None  Antimicrobials:  None   Subjective: Patient seen and examined at bedside.  Poor historian.  No fever, vomiting, chest pain reported.  Still short of breath with minimal exertion.   Objective: Vitals:   04/11/22 2112 04/12/22 0117 04/12/22 0539 04/12/22 0731  BP: 92/67 99/69 111/79   Pulse: (!) 105 95 99 81  Resp: '20 20 18 16  '$ Temp: 98.2 F (36.8 C) 98 F (36.7 C) 98 F (36.7 C)   TempSrc: Oral  Oral   SpO2: 95% 95% 94% 98%  Weight:   42.7 kg   Height:        Intake/Output Summary (Last 24 hours) at 04/12/2022 0808 Last data filed at 04/12/2022 0541 Gross per 24 hour  Intake 600 ml  Output 150 ml  Net 450 ml    Filed Weights   04/10/22 1311 04/12/22 0539  Weight: 41.7 kg 42.7 kg    Examination:  General: No acute distress.  On 2 L oxygen via nasal cannula.  Looks chronically ill and deconditioned.  Extremely thinly built. ENT/neck: No obvious JVD elevation or palpable masses noted  respiratory: Bilateral decreased breath sounds at bases with scattered crackles  CVS: Currently rate controlled; S1 and S2 are heard  abdominal: Soft, nontender, distended slightly; no organomegaly, bowel sounds heard normally  extremities: No clubbing; mild lower extremity edema present  CNS: Awake; answers some questions.  Poor historian. No focal neurologic deficit.  Able to move extremities Lymph: No palpable lymphadenopathy noted  skin: No obvious petechiae/rashes psych: Flat affect.  Currently not agitated  musculoskeletal: No obvious joint erythema/swelling/deformity     Data Reviewed: I have personally reviewed following labs and imaging studies  CBC: Recent Labs  Lab 04/09/22 1819 04/10/22 0343 04/11/22 0346  WBC 13.7* 12.0* 10.0  NEUTROABS 12.1*  --  9.1*  HGB 14.0 12.9 11.6*  HCT 43.6 40.0 35.6*  MCV 81.2 81.3 81.8  PLT 137* 122* 110*    Basic Metabolic Panel: Recent Labs  Lab 04/09/22 1819 04/10/22 0343 04/11/22 0346 04/11/22 1415 04/11/22 1754 04/12/22 0558  NA 133* 132*  132*  --   --  130*  K 3.7 3.9 3.7  --   --  4.6  CL 88* 89* 90*  --   --  87*  CO2 '27 28 27  '$ --   --  31  GLUCOSE 364* 282* 217* 412* 371* 211*  BUN 63* 56* 63*  --   --  71*  CREATININE 1.22* 1.16* 1.08*  --   --  1.15*  CALCIUM 9.5 9.1 9.1  --   --  9.1  MG  --  2.4 2.5*  --   --  2.4  PHOS  --  4.3  --   --   --   --     GFR: Estimated Creatinine Clearance: 30.7 mL/min (A) (by C-G formula based on SCr of 1.15 mg/dL (H)). Liver Function Tests: Recent Labs  Lab 04/09/22 1819 04/10/22 0343  AST 50* 48*  ALT 15 12  ALKPHOS 117 114  BILITOT 1.1 1.0  PROT 7.7 7.0  ALBUMIN 3.3* 3.1*    No results for input(s): "LIPASE", "AMYLASE" in the last 168 hours. No results for input(s): "AMMONIA" in the last 168 hours. Coagulation Profile: No results for input(s): "INR", "PROTIME" in the last 168 hours. Cardiac Enzymes: No results for input(s): "CKTOTAL", "CKMB", "CKMBINDEX", "TROPONINI" in the last 168 hours. BNP (last 3 results) No results  for input(s): "PROBNP" in the last 8760 hours. HbA1C: Recent Labs    04/10/22 0340 04/11/22 0346  HGBA1C 8.0* 8.0*   CBG: Recent Labs  Lab 04/11/22 0730 04/11/22 1156 04/11/22 1636 04/11/22 2142 04/12/22 0705  GLUCAP 199* 425* 416* 85 231*    Lipid Profile: No results for input(s): "CHOL", "HDL", "LDLCALC", "TRIG", "CHOLHDL", "LDLDIRECT" in the last 72 hours. Thyroid Function Tests: No results for input(s): "TSH", "T4TOTAL", "FREET4", "T3FREE", "THYROIDAB" in the last 72 hours. Anemia Panel: No results for input(s): "VITAMINB12", "FOLATE", "FERRITIN", "TIBC", "IRON", "RETICCTPCT" in the last 72 hours. Sepsis Labs: Recent Labs  Lab 04/09/22 1819 04/09/22 1945 04/10/22 0340 04/10/22 0550  LATICACIDVEN 1.9 2.6* 1.6 1.3     Recent Results (from the past 240 hour(s))  Blood culture (routine x 2)     Status: None (Preliminary result)   Collection Time: 04/09/22  6:15 PM   Specimen: Right Antecubital; Blood  Result Value Ref  Range Status   Specimen Description   Final    RIGHT ANTECUBITAL BOTTLES DRAWN AEROBIC AND ANAEROBIC   Special Requests Blood Culture adequate volume  Final   Culture   Final    NO GROWTH 3 DAYS Performed at [facility], [address] [place], [place] [address]    Report Status PENDING  Incomplete  Blood culture (routine x 2)     Status: None (Preliminary result)   Collection Time: 04/09/22  7:45 PM   Specimen: BLOOD LEFT FOREARM  Result Value Ref Range Status   Specimen Description   Final    BLOOD LEFT FOREARM BOTTLES DRAWN AEROBIC AND ANAEROBIC   Special Requests Blood Culture adequate volume  Final   Culture   Final    NO GROWTH 3 DAYS Performed at [facility], [address], [place], [place] [address]    Report Status PENDING  Incomplete         Radiology Studies: No results found.      Scheduled Meds:  (feeding supplement) PROSource Plus  30 mL Oral BID BM   budesonide (PULMICORT) nebulizer solution  0.5 mg Nebulization BID   dextromethorphan-guaiFENesin  1 tablet Oral BID   DULoxetine  60 mg Oral Daily   enoxaparin (LOVENOX) injection  40 mg Subcutaneous Q24H   feeding supplement (GLUCERNA SHAKE)  237 mL Oral TID BM   ferrous sulfate  325 mg Oral QODAY   insulin aspart  0-20 Units Subcutaneous TID WC   insulin aspart  0-5 Units Subcutaneous QHS   insulin glargine-yfgn  15 Units Subcutaneous BID   ipratropium-albuterol  3 mL Nebulization TID   levothyroxine  100 mcg Oral Daily   methylPREDNISolone (SOLU-MEDROL) injection  40 mg Intravenous Q12H   multivitamin with minerals  1 tablet Oral Daily   pantoprazole  40 mg Oral Daily   rosuvastatin  20 mg Oral Daily   Continuous Infusions:          Aline August, MD [facility] 04/12/2022, 8:08 AM

## 2022-04-13 ENCOUNTER — Encounter: Payer: Self-pay | Admitting: *Deleted

## 2022-04-13 ENCOUNTER — Ambulatory Visit: Payer: Self-pay | Admitting: *Deleted

## 2022-04-13 DIAGNOSIS — E43 Unspecified severe protein-calorie malnutrition: Secondary | ICD-10-CM | POA: Insufficient documentation

## 2022-04-13 DIAGNOSIS — R531 Weakness: Secondary | ICD-10-CM | POA: Diagnosis not present

## 2022-04-13 LAB — BASIC METABOLIC PANEL
Anion gap: 10 (ref 5–15)
BUN: 57 mg/dL — ABNORMAL HIGH (ref 8–23)
CO2: 29 mmol/L (ref 22–32)
Calcium: 8.7 mg/dL — ABNORMAL LOW (ref 8.9–10.3)
Chloride: 91 mmol/L — ABNORMAL LOW (ref 98–111)
Creatinine, Ser: 0.91 mg/dL (ref 0.44–1.00)
GFR, Estimated: >60 mL/min (ref >60)
Glucose, Bld: 187 mg/dL — ABNORMAL HIGH (ref 70–99)
Potassium: 4.3 mmol/L (ref 3.5–5.1)
Sodium: 130 mmol/L — ABNORMAL LOW (ref 135–145)

## 2022-04-13 LAB — CBC WITH DIFFERENTIAL/PLATELET
Abs Immature Granulocytes: 0.2 10*3/uL — ABNORMAL HIGH (ref 0.00–0.07)
Basophils Absolute: 0 10*3/uL (ref 0.0–0.1)
Basophils Relative: 0 %
Eosinophils Absolute: 0.1 10*3/uL (ref 0.0–0.5)
Eosinophils Relative: 1 %
HCT: 30.4 % — ABNORMAL LOW (ref 36.0–46.0)
Hemoglobin: 9.7 g/dL — ABNORMAL LOW (ref 12.0–15.0)
Immature Granulocytes: 2 %
Lymphocytes Relative: 4 %
Lymphs Abs: 0.4 10*3/uL — ABNORMAL LOW (ref 0.7–4.0)
MCH: 25.8 pg — ABNORMAL LOW (ref 26.0–34.0)
MCHC: 31.9 g/dL (ref 30.0–36.0)
MCV: 80.9 fL (ref 80.0–100.0)
Monocytes Absolute: 0.4 10*3/uL (ref 0.1–1.0)
Monocytes Relative: 4 %
Neutro Abs: 8.7 10*3/uL — ABNORMAL HIGH (ref 1.7–7.7)
Neutrophils Relative %: 89 %
Platelets: 111 10*3/uL — ABNORMAL LOW (ref 150–400)
RBC: 3.76 MIL/uL — ABNORMAL LOW (ref 3.87–5.11)
RDW: 17.2 % — ABNORMAL HIGH (ref 11.5–15.5)
WBC: 9.7 10*3/uL (ref 4.0–10.5)
nRBC: 0 % (ref 0.0–0.2)

## 2022-04-13 LAB — GLUCOSE, CAPILLARY
Glucose-Capillary: 195 mg/dL — ABNORMAL HIGH (ref 70–99)
Glucose-Capillary: 382 mg/dL — ABNORMAL HIGH (ref 70–99)
Glucose-Capillary: 169 mg/dL — ABNORMAL HIGH (ref 70–99)
Glucose-Capillary: 76 mg/dL (ref 70–99)

## 2022-04-13 LAB — MAGNESIUM: Magnesium: 2.4 mg/dL (ref 1.7–2.4)

## 2022-04-13 MED ORDER — GUAIFENESIN ER 600 MG PO TB12
1200.0000 mg | ORAL_TABLET | Freq: Two times a day (BID) | ORAL | Status: DC
  Administered 2022-04-13 – 2022-04-14 (×2): 1200 mg via ORAL
  Filled 2022-04-13 (×2): qty 2

## 2022-04-13 MED ORDER — GUAIFENESIN ER 600 MG PO TB12: 600.0000 mg | ORAL_TABLET | Freq: Two times a day (BID) | ORAL | Status: DC

## 2022-04-13 MED ORDER — MIDODRINE HCL 5 MG PO TABS
2.5000 mg | ORAL_TABLET | Freq: Three times a day (TID) | ORAL | Status: DC
  Administered 2022-04-13 – 2022-04-14 (×3): 2.5 mg via ORAL
  Filled 2022-04-13 (×3): qty 1

## 2022-04-13 MED ORDER — SODIUM CHLORIDE 0.9 % IV BOLUS
1000.0000 mL | Freq: Once | INTRAVENOUS | Status: AC
  Administered 2022-04-13: 1000 mL via INTRAVENOUS

## 2022-04-13 NOTE — TOC Progression Note (Signed)
Transition of Care [facility]) - Progression Note    Patient Details  Name: Kristin Wells MRN: [id number] Date of Birth: 10/01/1950  Transition of Care [facility]) CM/SW [place], [place] Phone Number: 04/13/2022, 3:14 PM  Clinical Narrative:    Pts insurance Josem Kaufmann is still pending at this time. TOC to follow.   Expected Discharge Plan: [place] Barriers to Discharge: Continued Medical Work up  Expected Discharge Plan and Services Expected Discharge Plan: [place] In-house Referral: Clinical Social Work Discharge Planning Services: CM Consult Post Acute Care Choice: [place] arrangements for the past 2 months: Single Family Home                                       Social Determinants of Health (SDOH) Interventions    Readmission Risk Interventions     No data to display

## 2022-04-13 NOTE — Patient Instructions (Signed)
Visit Information  Thank you for taking time to visit with me today. Please don't hesitate to contact me if I can be of assistance to you.   Following are the goals we discussed today:   Goals Addressed               This Visit's Progress     Assist with [place]. (pt-stated)   On track     Care Coordination Interventions:  CSW Collaboration with Attending Provider, Dr. Florencia Reasons to Encourage Direct Contact with Iona Beard, [facility] Social Worker, Currently Involved with Patient's Care, [employer] CSW Collaboration with Iona Beard, [facility] Social Worker, [employer], to [employer] About [employer] for Patient. Bed Offer Received from [place], Admissions Coordinator with [facility] [phone]), Pending Insurance Authorization and Approval. Patient and Daughter, Marcell Barlow Accepted Bed Offer at [facility] [phone]).    Care Coordination Interventions:  Assessed Social Determinant of Health Barriers. Discussed Plans for Ongoing Care Management Follow Up. Provided with Direct Contact Information for Care Management Team. Screened for Signs & Symptoms of Depression Related to Chronic Disease State.  DVV6/HYW7 Depression Screen Completed & Results Reviewed.  Suicidal Ideation/Homicidal Ideation Assessed - None Present. Solution-Focused Strategies Employed. Deep Breathing Exercises, Relaxation Techniques & Mindfulness Meditation Strategies Taught & Encouraged Daily.   Active Listening/Reflection Utilized.  Emotional Support Provided. Verbalization of Feelings Encouraged.  Caregiver Stress Acknowledged. Caregiver Resources Reviewed. Problem Solving/Task-Centered Solutions Developed.   Psychoeducation for Mental Health Concerns Initiated. Reviewed Mental Health Medications & Discussed Importance of Compliance. Quality of Sleep Assessed & Sleep  Hygiene Techniques Promoted. Participation in Counseling Encouraged. Discussed Referral to Psychiatrist for Psychotropic Medication Management. Discussed Referral to Therapist for Psychotherapeutic Counseling Services. Mailed the Following List of Agencies and Resources, on 04/06/2022:             [place] Instructions Personal Care Services Providers Encouraged to Review the List of Agencies and Resources Provided, and Be Prepared to Discuss During Our Next Scheduled Telephone [employer]. CSW Collaboration with [place], [place] with [facility] [phone]), Where Patient Currently Resides, to Discuss Discharge Disposition and to Assist with Discharge Planning Needs. ~ Madelin Headings to [place], No Home Health Agencies Available (Under [employer]). ~ [place] Refusing Placement, of Any Kind, at Present Time. ~ Outpatient Therapies (Physical and Occupational) Recommended. Continue to [employer] [place] [phone]).   Care Coordination Interventions:  Talked with Earlie Server about Care Coordination program support Encouraged [place] to talk with PCP about program support. Discussed insurance of client. She has [place] and Medicaid. Discussed fall risk of client. Client said she has fallen 5 times in past month. She said her legs feel weak and legs give way some time.She has problem getting back up and down. Uses walker. She has pain in her knees.  She uses elevator frequently in building.   Discussed Transport needs. She said she uses [employer] with lift on van to help her go to and from medical appointments. Reviewed medications procurement of client.   Reviewed vision of client. She said she has vision decrease. Client said she has difficulty  standing up.   Client eats sandwiches often. Uses microwave to warm up food.  Discussed family support. She said she has  limited family support Provided counseling support for client. Discussed relaxation techniques with client Client and LCSW spoke of her cancer treatments. She spoke of cancer treatments and was very fatigued after cancer treatments. She said she has had 10 cancer treatments.  Discussed her feelings about managing cancer treatments. She said she thinks she is scheduled for a type of scan soon .  Discussed art therapy.  She likes to do art drawing if able and has done art drawing in the past.  She tries to take naps as part of relaxation approach.   Discussed finances of client.  Client said she has Representative Payee that helps her manage client finances. Client has difficulty getting up and getting to door at her apartment. It takes her a little while to get to her door.   Talked with client about ADLs completion.  She takes sink bath regularly to help with bathing Client spoke of her cancer treatments (10 treatments received).   Reviewed sleeping challenges. She has sleeping difficulty Talked of her next appointment at office of PCP on April 11 2022. Client said she would like RN with Care Coordination program to call her to discuss her nursing needs.  Discussed dental needs of client. She eats soft foods. She has difficulty paying copays for medical or dental visits. LCSW to schedule RN  phone visit with client LCSW  Theadore Nan to schedule client phone visit with Nat Christen who is designated LCSW for client going forward         Our next appointment is by telephone on 04/22/2022 at 1:15 pm.  Please call the care guide team at [phone] if you need to cancel or reschedule your appointment.   If you are experiencing a Mental Health or [place] or need someone to talk to, please call the Suicide and Crisis Lifeline: 988 call the [place]  National Suicide Prevention Lifeline: 639-286-2432 or TTY: 318 614 1705 TTY 325-192-6697) to talk to a trained counselor call 1-800-273-TALK (toll free, 24 hour hotline) go to [facility] [address], [place] [phone]) call the [place]: [phone] call 911  Patient verbalizes understanding of instructions and care plan provided today and agrees to view in [place]. Active MyChart status and patient understanding of how to access instructions and care plan via MyChart confirmed with patient.     Telephone follow up appointment with care management team member scheduled for:  04/22/2022 at 1:15 pm.  Nat Christen, BSW, MSW, [place]  Licensed Clinical Social Worker  [place]  Mailing [place]. [address] [place], [place] [address] Physical Address-[address], [place], [place] [address] Toll Free Main # [phone] Fax # [phone] Cell # [phone] [email or web address]

## 2022-04-13 NOTE — Inpatient Diabetes Management (Signed)
Inpatient Diabetes Program Recommendations  AACE/ADA: New Consensus Statement on Inpatient Glycemic Control (2015)  Target Ranges:  Prepandial:   less than 140 mg/dL      Peak postprandial:   less than 180 mg/dL (1-2 hours)      Critically ill patients:  140 - 180 mg/dL   Lab Results  Component Value Date   GLUCAP 382 (H) 04/13/2022   HGBA1C 8.0 (H) 04/11/2022    Review of Glycemic Control  Latest Reference Range & Units 04/12/22 07:05 04/12/22 11:42 04/12/22 16:17 04/12/22 17:14 04/12/22 21:27 04/13/22 07:55 04/13/22 11:33  Glucose-Capillary 70 - 99 mg/dL 231 (H) 340 (H) 59 (L) 120 (H) 145 (H) 195 (H) 382 (H)   Diabetes history: Type 2 DM Outpatient Diabetes medications: Metformin 1000 mg BID Current orders for Inpatient glycemic control:  Novolog 0-20 units TID & HS Semglee 5 units BID  Solumedrol 40 mg BID Glucerna tid between meals  Note Semglee reduced yesterday from 15 units bid to 5 units bid due to hypoglycemia  Inpatient Diabetes Program Recommendations:    Postprandials exceeding inpatient goals due to steroids.   -  Consider adding Novolog 4 units TID (Assuming patient is consuming >50% of meals). -  Reduce Novolog Correction to 0-15 units tid + hs  Thanks, Tama Headings RN, MSN, BC-ADM Inpatient Diabetes Coordinator Team Pager [phone] (8a-5p)

## 2022-04-13 NOTE — Patient Outreach (Signed)
Care Coordination   Follow Up Visit Note   04/13/2022  Name: TANYIKA BARROS MRN: [id number] DOB: 1950/05/24  [name] is a 71 y.o. year old female who sees [place] Crane, Edwinna Areola, MD for primary care. I collaborated with Attending Provider, Dr. Florencia Reasons and [facility] Social Worker, Iona Beard to coordinate discharge planning needs.  What matters to the patients health and wellness today?   Assist with [place] (unless patient decides to remain at [facility] for long-term care).      Goals Addressed               This Visit's Progress     Assist with [place]. (pt-stated)   On track     Care Coordination Interventions:  CSW Collaboration with Attending Provider, Dr. Florencia Reasons to Encourage Direct Contact with Iona Beard, [place] Worker, Currently Involved with Patient's Care, [employer] CSW Collaboration with Iona Beard, [facility] Social Worker, [employer], to [employer] About [employer] for Patient. Bed Offer Received from [place], Admissions Coordinator with [facility] [phone]), Pending Insurance Authorization and Approval. Patient and Daughter, Marcell Barlow Accepted Bed Offer at [facility] [phone]).    Care Coordination Interventions:  Assessed Social Determinant of Health Barriers. Discussed Plans for Ongoing Care Management Follow Up. Provided with Direct Contact Information for Care Management Team. Screened for Signs & Symptoms of Depression Related to Chronic Disease State.  SJG2/EZM6 Depression Screen Completed & Results Reviewed.  Suicidal Ideation/Homicidal Ideation Assessed - None Present. Solution-Focused Strategies Employed. Deep Breathing Exercises, Relaxation Techniques & Mindfulness Meditation Strategies Taught & Encouraged Daily.   Active Listening/Reflection  Utilized.  Emotional Support Provided. Verbalization of Feelings Encouraged.  Caregiver Stress Acknowledged. Caregiver Resources Reviewed. Problem Solving/Task-Centered Solutions Developed.   Psychoeducation for Mental Health Concerns Initiated. Reviewed Mental Health Medications & Discussed Importance of Compliance. Quality of Sleep Assessed & Sleep Hygiene Techniques Promoted. Participation in Counseling Encouraged. Discussed Referral to Psychiatrist for Psychotropic Medication Management. Discussed Referral to Therapist for Psychotherapeutic Counseling Services. Mailed the Following List of Agencies and Resources, on 04/06/2022:             [place] Instructions Personal Care Services Providers Encouraged to Review the List of Agencies and Resources Provided, and Be Prepared to Discuss During Our Next Scheduled Telephone [employer]. CSW Collaboration with [place], [place] with [facility] [phone]), Where Patient Currently Resides, to Discuss Discharge Disposition and to Assist with Discharge Planning Needs. ~ Madelin Headings to [place], No Home Health Agencies Available (Under [employer]). ~ [place] Refusing Placement, of Any Kind, at Present Time. ~ Outpatient Therapies (Physical and Occupational) Recommended. Continue to [employer] Through [place] [phone]).   Care Coordination Interventions:  Talked with Earlie Server about Care Coordination program support Encouraged [place] to talk with PCP about program support. Discussed insurance of client. She has [place] and Medicaid. Discussed fall risk of client. Client said she has fallen 5 times in past month. She said her legs feel weak and legs give way some time.She has problem getting back  up and down. Uses walker. She has pain in her knees.  She uses elevator frequently in building.   Discussed Transport needs. She said she uses  [employer] with lift on van to help her go to and from medical appointments. Reviewed medications procurement of client.   Reviewed vision of client. She said she has vision decrease. Client said she has difficulty standing up.   Client eats sandwiches often. Uses microwave to warm up food.  Discussed family support. She said she has limited family support Provided counseling support for client. Discussed relaxation techniques with client Client and LCSW spoke of her cancer treatments. She spoke of cancer treatments and was very fatigued after cancer treatments. She said she has had 10 cancer treatments.  Discussed her feelings about managing cancer treatments. She said she thinks she is scheduled for a type of scan soon .  Discussed art therapy.  She likes to do art drawing if able and has done art drawing in the past.  She tries to take naps as part of relaxation approach.   Discussed finances of client.  Client said she has Representative Payee that helps her manage client finances. Client has difficulty getting up and getting to door at her apartment. It takes her a little while to get to her door.   Talked with client about ADLs completion.  She takes sink bath regularly to help with bathing Client spoke of her cancer treatments (10 treatments received).   Reviewed sleeping challenges. She has sleeping difficulty Talked of her next appointment at office of PCP on April 11 2022. Client said she would like RN with Care Coordination program to call her to discuss her nursing needs.  Discussed dental needs of client. She eats soft foods. She has difficulty paying copays for medical or dental visits. LCSW to schedule RN  phone visit with client LCSW  Theadore Nan to schedule client phone visit with Nat Christen who is designated LCSW for client going  forward         SDOH assessments and interventions completed:  Yes.  Care Coordination Interventions:  Yes, provided.   Follow up plan: Follow up call scheduled for 04/22/2022 at 1:15 pm.   Encounter Outcome:  Pt. Visit Completed.   Nat Christen, BSW, MSW, LCSW  Licensed Education officer, [facility]  Mailing [place] [address], [place], [place] [address] Physical Address-[address] [place], [place] [address] Toll Free Main # [phone] Fax # [phone] Cell # [phone] [email or web address]

## 2022-04-13 NOTE — Progress Notes (Signed)
Physical Therapy Treatment Patient Details Name: Kristin Wells MRN: [id number] DOB: 07-Jun-1950 Today's Date: 04/13/2022   History of Present Illness Kristin Wells is a 71 y.o. F admitted on 04/09/22 due to increased weakness and unable to care for herself. Pt recently at [facility] due to concerns for cord compression and hypoxia with recommendations for SNF, however left AMA due to not wanting to go to a SNF. PMH significant for hypertension, hyperlipidemia, type 2 diabetes mellitus, COPD on supplemental oxygen at 2 LPM, stage IV medullary thyroid cancer (s/p total thyroidectomy in 09/2021 with bone metastasis), depression.    PT Comments    Patient able to sit up at bedside with use of bed rails. Patient's SpO2 at 97% at rest on 2 LPM O2. Patient able to complete several LE exercises at EOB with good form although decreased endurance noted. Patient then able to ambulate in hallway 35 ft with min guard/RW limited mostly due to generalized weakness and fatigue with SpO2 averaging 88%. Patient's SpO2 increased to 99% at rest following ambulation. Patient left on 2 LPM O2 and tolerated sitting up in chair after therapy - nurse aware. Patient will benefit from continued skilled physical therapy in hospital and recommended venue below to increase strength, balance, endurance for safe ADLs and gait.    Recommendations for follow up therapy are one component of a multi-disciplinary discharge planning process, led by the attending physician.  Recommendations may be updated based on patient status, additional functional criteria and insurance authorization.  Follow Up Recommendations  Skilled nursing-short term rehab (<3 hours/day) Can patient physically be transported by private vehicle: Yes   Assistance Recommended at Discharge Set up Supervision/Assistance  Patient can return home with the following Assistance with cooking/housework;Help with stairs or ramp for entrance;A lot of help with  bathing/dressing/bathroom;A little help with walking and/or transfers   Equipment Recommendations  None recommended by PT    Recommendations for Other Services       Precautions / Restrictions Precautions Precautions: Fall Restrictions Weight Bearing Restrictions: No     Mobility  Bed Mobility Overal bed mobility: Modified Independent Bed Mobility: Supine to Sit     Supine to sit: Modified independent (Device/Increase time), HOB elevated     General bed mobility comments: patient able to sit up at bedside with [facility] slightly raised and with use of bed rails    Transfers Overall transfer level: Needs assistance Equipment used: Rolling walker (2 wheels) Transfers: Sit to/from Stand Sit to Stand: Mod assist           General transfer comment: patient able to stand with mod assist/RW due to generalized weakness    Ambulation/Gait Ambulation/Gait assistance: Min guard Gait Distance (Feet): 35 Feet Assistive device: Rolling walker (2 wheels) Gait Pattern/deviations: Decreased step length - right, Decreased step length - left, Decreased stride length, Trunk flexed Gait velocity: slow     General Gait Details: patient with slowed cadence, able to ambulate in hallway with min guard/RW limited mostly due to fatigue and generalized weakness. Patient's SpO2 at 97% on 2 LPM at rest before ambulation, averaging 88% during ambulation, increased to 99% at rest following ambulation, pt left on 2 LPM O2   Stairs             Wheelchair Mobility    Modified Rankin (Stroke Patients Only)       Balance Overall balance assessment: Needs assistance Sitting-balance support: Bilateral upper extremity supported, Feet supported Sitting balance-Leahy Scale: Good Sitting balance -  Comments: seated EOB   Standing balance support: During functional activity, Reliant on assistive device for balance, Bilateral upper extremity supported Standing balance-Leahy Scale: Fair Standing  balance comment: with RW                            Cognition Arousal/Alertness: Awake/alert Behavior During Therapy: WFL for tasks assessed/performed Overall Cognitive Status: Within Functional Limits for tasks assessed                                          Exercises General Exercises - Lower Extremity Long Arc Quad: Seated, AROM, Strengthening, Both, 10 reps Hip Flexion/Marching: Seated, AROM, Strengthening, Both, 10 reps Toe Raises: Seated, AROM, Strengthening, Both, 10 reps Heel Raises: Seated, AROM, Strengthening, Both, 10 reps    General Comments        Pertinent Vitals/Pain Pain Assessment Pain Assessment: No/denies pain    Home Living                          Prior Function            PT Goals (current goals can now be found in the care plan section) Acute Rehab PT Goals Patient Stated Goal: return home after rehab PT Goal Formulation: With patient Time For Goal Achievement: 04/27/22 Potential to Achieve Goals: Good Progress towards PT goals: Progressing toward goals    Frequency    Min 3X/week      PT Plan Current plan remains appropriate    Co-evaluation              AM-PAC PT "6 Clicks" Mobility   Outcome Measure  Help needed turning from your back to your side while in a flat bed without using bedrails?: None Help needed moving from lying on your back to sitting on the side of a flat bed without using bedrails?: A Little Help needed moving to and from a bed to a chair (including a wheelchair)?: A Little Help needed standing up from a chair using your arms (e.g., wheelchair or bedside chair)?: A Lot Help needed to walk in hospital room?: A Little Help needed climbing 3-5 steps with a railing? : A Lot 6 Click Score: 17    End of Session Equipment Utilized During Treatment: Oxygen Activity Tolerance: Patient tolerated treatment well;Patient limited by fatigue Patient left: in chair;with call  bell/phone within reach;with nursing/sitter in room Nurse Communication: Mobility status PT Visit Diagnosis: Unsteadiness on feet (R26.81);Other abnormalities of gait and mobility (R26.89);Muscle weakness (generalized) (M62.81)     Time: 2122-4825 PT Time Calculation (min) (ACUTE ONLY): 30 min  Charges:  $Gait Training: 8-22 mins $Therapeutic Exercise: 8-22 mins                     Zigmund Gottron, SPT

## 2022-04-13 NOTE — Progress Notes (Signed)
PROGRESS NOTE    Kristin Wells  ZOX:[id number] DOB: 1950/05/11 DOA: 04/09/2022 PCP: Celene Squibb, MD   Brief Narrative:  71 year old female with medical history significant of hypertension, hyperlipidemia, type 2 diabetes mellitus, COPD on supplemental oxygen at 2 LPM, stage IV medullary thyroid cancer (s/p total thyroidectomy in 09/2021 with bone metastasis), depression, recent hospitalization at [facility] due to concern for cord compression and hypoxia with recommendations for SNF placement but patient left AMA because she did not want to go to nursing home on discharge presented with generalized weakness and unable to take care of herself.  On presentation, WBC was 13.7, D-dimer was more than 20, lactic acid 2.6, CTA chest was negative for PE and showed stable osseous metastatic disease and stable 2 mm left lower lobe pulmonary nodule.  She was started on IV fluids.  Palliative care and oncology were consulted.  Assessment & Plan:   Generalized weakness and failure to thrive -Physical deconditioningSevere malnutrition -Stage IV medullary cancer status post total thyroidectomy with bony metastases with recent diagnosis of cord compression treated with Decadron -She was recently hospitalized at [facility] due to concern for cord compression and hypoxia with recommendations for SNF placement but patient left AMA because she did not want to go to nursing home on discharge.  However, she  presented with generalized weakness and unable to take care of herself. -Reviewed in care everywhere notes from [facility]: There was some discussion about possibility of hospice involvement.   -seen by palliative care here, she is DNR, she now is agreeing to SNF placement and desires for XRT treatment to her back, then transition to hospice care after XRt treatment -case discussed with oncology Dr Orson Aloe who agree with continue steroids, he will notify [facility]  Dr  Sharyn Dross  Dehydration/poor oral intake -with hyponatremia, hypotension -resume ivf, start low dose midodrine  Chronic diastolic heart failure  -Echo on May 27, 2021 had shown EF of 65 to 70% with grade 1 diastolic dysfunction -Required IV Lasix during recent hospitalization at [facility].  On oral Lasix at home.   -Received 1 dose of IV Lasix 40 mg on 04/10/2022.  Blood pressure on the lower side currently.   -I have reviewed cta from 12/2, did not show  pulmonary edema or pleural effusion  - resume ivf due to above, close monitor volume status, repeat bnp -Outpatient follow-up with cardiology.  Acute on chronic respiratory failure with hypoxia/COPD -Normally wears 2 L oxygen via nasal cannula.  Oxygen requirement improving to 2 L oxygen.  Diuretic plan as above. -Continue Solu-Medrol along with nebs -Incentive spirometry/flutter valve  Lactic acidosis -Possibly from all of the above.  Resolved  Leukocytosis -resolved  CKD stage IIIa -Creatinine at baseline.  Repeat a.m. labs   Thrombocytopenia/anemia -Questionable cause.  No signs of bleeding.  Monitor intermittently  Diabetes mellitus type 2 with hyperglycemia with hypoglycemia -Continue CBGs with SSI.  Patient was recently on Decadron as an outpatient.  Carb modified diet.  Continue long-acting insulin.   Hyperlipidemia -Continue Crestor  Depression -Continue Cymbalta  Acquired hypothyroidism -Continue Synthroid  FTT, poor prognosis    DVT prophylaxis: Lovenox Code Status: DNR Family Communication: sister at bedside Disposition Plan: .  Need for SNF placement with palliative care, awaiting for insurance auth she desires XRT treatment then hospice   Consultants: oncology and palliative care    Subjective:  She is a Poor historian. Hard of hearing  Reports low back pain, poor appetite      Some chronic cough, reports on home oxygen On steroids Family at bedside  Medically stable to  discharge, awaiting for insurance approval to to go snf and getting xrt treatment  Objective: Vitals:   04/12/22 2208 04/13/22 0455 04/13/22 0500 04/13/22 0737  BP: 94/65 92/66    Pulse: 100 88  93  Resp: '18 18  18  '$ Temp: 98.7 F (37.1 C) 98 F (36.7 C)    TempSrc:      SpO2: 97% 97%  96%  Weight:   42.1 kg   Height:        Intake/Output Summary (Last 24 hours) at 04/13/2022 1402 Last data filed at 04/13/2022 0500 Gross per 24 hour  Intake 320 ml  Output 700 ml  Net -380 ml   Filed Weights   04/10/22 1311 04/12/22 0539 04/13/22 0500  Weight: 41.7 kg 42.7 kg 42.1 kg    Examination:  General: frail elderly, chronically ill and deconditioned. hard of hearing,  On 2 L oxygen via nasal cannula.    ENT/neck: No obvious JVD elevation or palpable masses noted  respiratory: Bilateral decreased breath sounds at bases with scattered crackles  CVS: Currently rate controlled; S1 and S2 are heard  abdominal: Soft, nontender, distended slightly; no organomegaly, bowel sounds heard normally  extremities: No clubbing; mild lower extremity edema present  CNS: Awake; answers some questions.  Poor historian. No focal neurologic deficit.  Able to move extremities Lymph: No palpable lymphadenopathy noted  skin: No obvious petechiae/rashes psych: Flat affect.  Currently not agitated  musculoskeletal: No obvious joint erythema/swelling/deformity     Data Reviewed: I have personally reviewed following labs and imaging studies  CBC: Recent Labs  Lab 04/09/22 1819 04/10/22 0343 04/11/22 0346 04/13/22 0525  WBC 13.7* 12.0* 10.0 9.7  NEUTROABS 12.1*  --  9.1* 8.7*  HGB 14.0 12.9 11.6* 9.7*  HCT 43.6 40.0 35.6* 30.4*  MCV 81.2 81.3 81.8 80.9  PLT 137* 122* 110* 174*   Basic Metabolic Panel: Recent Labs  Lab 04/09/22 1819 04/10/22 0343 04/11/22 0346 04/11/22 1415 04/11/22 1754 04/12/22 0558 04/13/22 0525  NA 133* 132* 132*  --   --  130* 130*  K 3.7 3.9 3.7  --   --  4.6 4.3   CL 88* 89* 90*  --   --  87* 91*  CO2 '27 28 27  '$ --   --  31 29  GLUCOSE 364* 282* 217* 412* 371* 211* 187*  BUN 63* 56* 63*  --   --  71* 57*  CREATININE 1.22* 1.16* 1.08*  --   --  1.15* 0.91  CALCIUM 9.5 9.1 9.1  --   --  9.1 8.7*  MG  --  2.4 2.5*  --   --  2.4 2.4  PHOS  --  4.3  --   --   --   --   --    GFR: Estimated Creatinine Clearance: 38.2 mL/min (by C-G formula based on SCr of 0.91 mg/dL). Liver Function Tests: Recent Labs  Lab 04/09/22 1819 04/10/22 0343  AST 50* 48*  ALT 15 12  ALKPHOS 117 114  BILITOT 1.1 1.0  PROT 7.7 7.0  ALBUMIN 3.3* 3.1*   No results for input(s): "LIPASE", "AMYLASE" in the last 168 hours. No results for input(s): "AMMONIA" in the last 168 hours. Coagulation Profile: No results for input(s): "INR", "PROTIME" in the last 168 hours. Cardiac Enzymes: No results for  input(s): "CKTOTAL", "CKMB", "CKMBINDEX", "TROPONINI" in the last 168 hours. BNP (last 3 results) No results for input(s): "PROBNP" in the last 8760 hours. HbA1C: Recent Labs    04/11/22 0346  HGBA1C 8.0*   CBG: Recent Labs  Lab 04/12/22 1617 04/12/22 1714 04/12/22 2127 04/13/22 0755 04/13/22 1133  GLUCAP 59* 120* 145* 195* 382*   Lipid Profile: No results for input(s): "CHOL", "HDL", "LDLCALC", "TRIG", "CHOLHDL", "LDLDIRECT" in the last 72 hours. Thyroid Function Tests: No results for input(s): "TSH", "T4TOTAL", "FREET4", "T3FREE", "THYROIDAB" in the last 72 hours. Anemia Panel: No results for input(s): "VITAMINB12", "FOLATE", "FERRITIN", "TIBC", "IRON", "RETICCTPCT" in the last 72 hours. Sepsis Labs: Recent Labs  Lab 04/09/22 1819 04/09/22 1945 04/10/22 0340 04/10/22 0550  LATICACIDVEN 1.9 2.6* 1.6 1.3    Recent Results (from the past 240 hour(s))  Blood culture (routine x 2)     Status: None (Preliminary result)   Collection Time: 04/09/22  6:15 PM   Specimen: Right Antecubital; Blood  Result Value Ref Range Status   Specimen Description   Final     RIGHT ANTECUBITAL BOTTLES DRAWN AEROBIC AND ANAEROBIC   Special Requests Blood Culture adequate volume  Final   Culture   Final    NO GROWTH 4 DAYS Performed at [facility], [address] [place], [place] [address]    Report Status PENDING  Incomplete  Blood culture (routine x 2)     Status: None (Preliminary result)   Collection Time: 04/09/22  7:45 PM   Specimen: BLOOD LEFT FOREARM  Result Value Ref Range Status   Specimen Description   Final    BLOOD LEFT FOREARM BOTTLES DRAWN AEROBIC AND ANAEROBIC   Special Requests Blood Culture adequate volume  Final   Culture   Final    NO GROWTH 4 DAYS Performed at [facility], [address] [place], [place] [address]    Report Status PENDING  Incomplete         Radiology Studies: No results found.      Scheduled Meds:  (feeding supplement) PROSource Plus  30 mL Oral BID BM   budesonide (PULMICORT) nebulizer solution  0.5 mg Nebulization BID   dextromethorphan-guaiFENesin  1 tablet Oral BID   dextrose  25 mL Intravenous Once   DULoxetine  60 mg Oral Daily   enoxaparin (LOVENOX) injection  40 mg Subcutaneous Q24H   feeding supplement (GLUCERNA SHAKE)  237 mL Oral TID BM   ferrous sulfate  325 mg Oral QODAY   guaiFENesin  600 mg Oral BID   insulin aspart  0-20 Units Subcutaneous TID WC   insulin aspart  0-5 Units Subcutaneous QHS   insulin glargine-yfgn  5 Units Subcutaneous BID   ipratropium-albuterol  3 mL Nebulization TID   levothyroxine  100 mcg Oral Daily   methylPREDNISolone (SOLU-MEDROL) injection  40 mg Intravenous Q12H   multivitamin with minerals  1 tablet Oral Daily   pantoprazole  40 mg Oral Daily   rosuvastatin  20 mg Oral Daily   Continuous Infusions:     Florencia Reasons, MD PhD FACP [facility] 04/13/2022, 2:02 PM

## 2022-04-13 NOTE — Progress Notes (Signed)
   04/13/22 1656  Assess: MEWS Score  BP (!) 74/54  MAP (mmHg) (!) 62  Pulse Rate (!) 101  SpO2 98 %  Assess: MEWS Score  MEWS Temp 0  MEWS Systolic 2  MEWS Pulse 1  MEWS RR 0  MEWS LOC 0  MEWS Score 3  MEWS Score Color Yellow  Assess: if the MEWS score is Yellow or Red  Were vital signs taken at a resting state? Yes  Focused Assessment No change from prior assessment  Does the patient meet 2 or more of the SIRS criteria? No  MEWS guidelines implemented *See Row Information* Yes  Take Vital Signs  Increase Vital Sign Frequency  Yellow: Q 2hr X 2 then Q 4hr X 2, if remains yellow, continue Q 4hrs  Escalate  MEWS: Escalate Yellow: discuss with charge nurse/RN and consider discussing with provider and RRT  Notify: Charge Nurse/RN  Name of Charge Nurse/RN Notified Audrea Muscat, RN  Date Charge Nurse/RN Notified 04/13/22  Time Charge Nurse/RN Notified 1702  Provider Notification  Provider Name/Title Dr. Erlinda Hong  Date Provider Notified 04/13/22  Time Provider Notified 1700  Method of Notification Page  Notification Reason Critical Result;Other (Comment) (Low BP)  Provider response See new orders  Date of Provider Response 04/13/22  Time of Provider Response 1708  Assess: SIRS CRITERIA  SIRS Temperature  0  SIRS Pulse 1  SIRS Respirations  0  SIRS WBC 1  SIRS Score Sum  2

## 2022-04-14 DIAGNOSIS — R531 Weakness: Secondary | ICD-10-CM | POA: Diagnosis not present

## 2022-04-14 DIAGNOSIS — C73 Malignant neoplasm of thyroid gland: Secondary | ICD-10-CM | POA: Diagnosis not present

## 2022-04-14 DIAGNOSIS — R970 Elevated carcinoembryonic antigen [CEA]: Secondary | ICD-10-CM | POA: Diagnosis not present

## 2022-04-14 DIAGNOSIS — I5033 Acute on chronic diastolic (congestive) heart failure: Secondary | ICD-10-CM | POA: Diagnosis not present

## 2022-04-14 DIAGNOSIS — Z923 Personal history of irradiation: Secondary | ICD-10-CM | POA: Diagnosis not present

## 2022-04-14 DIAGNOSIS — J439 Emphysema, unspecified: Secondary | ICD-10-CM | POA: Diagnosis not present

## 2022-04-14 DIAGNOSIS — E43 Unspecified severe protein-calorie malnutrition: Secondary | ICD-10-CM | POA: Diagnosis not present

## 2022-04-14 DIAGNOSIS — D849 Immunodeficiency, unspecified: Secondary | ICD-10-CM | POA: Diagnosis not present

## 2022-04-14 DIAGNOSIS — Z5181 Encounter for therapeutic drug level monitoring: Secondary | ICD-10-CM | POA: Diagnosis not present

## 2022-04-14 DIAGNOSIS — J449 Chronic obstructive pulmonary disease, unspecified: Secondary | ICD-10-CM | POA: Diagnosis not present

## 2022-04-14 DIAGNOSIS — C7951 Secondary malignant neoplasm of bone: Secondary | ICD-10-CM | POA: Diagnosis not present

## 2022-04-14 DIAGNOSIS — E039 Hypothyroidism, unspecified: Secondary | ICD-10-CM | POA: Diagnosis not present

## 2022-04-14 DIAGNOSIS — M6281 Muscle weakness (generalized): Secondary | ICD-10-CM | POA: Diagnosis not present

## 2022-04-14 DIAGNOSIS — Z9089 Acquired absence of other organs: Secondary | ICD-10-CM | POA: Diagnosis not present

## 2022-04-14 DIAGNOSIS — N1831 Chronic kidney disease, stage 3a: Secondary | ICD-10-CM | POA: Diagnosis not present

## 2022-04-14 DIAGNOSIS — E1165 Type 2 diabetes mellitus with hyperglycemia: Secondary | ICD-10-CM | POA: Diagnosis not present

## 2022-04-14 DIAGNOSIS — G9529 Other cord compression: Secondary | ICD-10-CM | POA: Diagnosis not present

## 2022-04-14 DIAGNOSIS — J99 Respiratory disorders in diseases classified elsewhere: Secondary | ICD-10-CM | POA: Diagnosis not present

## 2022-04-14 LAB — BASIC METABOLIC PANEL
Anion gap: 8 (ref 5–15)
BUN: 45 mg/dL — ABNORMAL HIGH (ref 8–23)
CO2: 29 mmol/L (ref 22–32)
Calcium: 8.3 mg/dL — ABNORMAL LOW (ref 8.9–10.3)
Chloride: 98 mmol/L (ref 98–111)
Creatinine, Ser: 0.72 mg/dL (ref 0.44–1.00)
GFR, Estimated: >60 mL/min (ref >60)
Glucose, Bld: 100 mg/dL — ABNORMAL HIGH (ref 70–99)
Potassium: 4.1 mmol/L (ref 3.5–5.1)
Sodium: 135 mmol/L (ref 135–145)

## 2022-04-14 LAB — GLUCOSE, CAPILLARY
Glucose-Capillary: 127 mg/dL — ABNORMAL HIGH (ref 70–99)
Glucose-Capillary: 345 mg/dL — ABNORMAL HIGH (ref 70–99)

## 2022-04-14 LAB — CBC WITH DIFFERENTIAL/PLATELET
Abs Immature Granulocytes: 0.29 10*3/uL — ABNORMAL HIGH (ref 0.00–0.07)
Basophils Absolute: 0 10*3/uL (ref 0.0–0.1)
Basophils Relative: 0 %
Eosinophils Absolute: 0.1 10*3/uL (ref 0.0–0.5)
Eosinophils Relative: 1 %
HCT: 28 % — ABNORMAL LOW (ref 36.0–46.0)
Hemoglobin: 8.9 g/dL — ABNORMAL LOW (ref 12.0–15.0)
Immature Granulocytes: 3 %
Lymphocytes Relative: 5 %
Lymphs Abs: 0.5 10*3/uL — ABNORMAL LOW (ref 0.7–4.0)
MCH: 26.4 pg (ref 26.0–34.0)
MCHC: 31.8 g/dL (ref 30.0–36.0)
MCV: 83.1 fL (ref 80.0–100.0)
Monocytes Absolute: 0.3 10*3/uL (ref 0.1–1.0)
Monocytes Relative: 3 %
Neutro Abs: 7.8 10*3/uL — ABNORMAL HIGH (ref 1.7–7.7)
Neutrophils Relative %: 88 %
Platelets: 125 10*3/uL — ABNORMAL LOW (ref 150–400)
RBC: 3.37 MIL/uL — ABNORMAL LOW (ref 3.87–5.11)
RDW: 17.8 % — ABNORMAL HIGH (ref 11.5–15.5)
WBC: 9 10*3/uL (ref 4.0–10.5)
nRBC: 0.2 % (ref 0.0–0.2)

## 2022-04-14 LAB — CULTURE, BLOOD (ROUTINE X 2)
Culture: NO GROWTH
Culture: NO GROWTH
Special Requests: ADEQUATE
Special Requests: ADEQUATE

## 2022-04-14 LAB — BRAIN NATRIURETIC PEPTIDE: B Natriuretic Peptide: 840 pg/mL — ABNORMAL HIGH (ref 0.0–100.0)

## 2022-04-14 MED ORDER — OXYCODONE HCL 5 MG PO TABS: 5.0000 mg | ORAL_TABLET | ORAL | 0 refills | Status: AC | PRN

## 2022-04-14 MED ORDER — SENNOSIDES-DOCUSATE SODIUM 8.6-50 MG PO TABS: 1.0000 | ORAL_TABLET | Freq: Two times a day (BID) | ORAL | Status: AC

## 2022-04-14 MED ORDER — OXYCODONE HCL 5 MG PO TABS: 5.0000 mg | ORAL_TABLET | ORAL | 0 refills | Status: DC | PRN

## 2022-04-14 MED ORDER — FUROSEMIDE 20 MG PO TABS: 20.0000 mg | ORAL_TABLET | Freq: Every day | ORAL | 6 refills | Status: AC | PRN

## 2022-04-14 MED ORDER — FERROUS SULFATE 325 (65 FE) MG PO TABS: 325.0000 mg | ORAL_TABLET | ORAL | 3 refills | Status: AC

## 2022-04-14 MED ORDER — MIDODRINE HCL 2.5 MG PO TABS: 2.5000 mg | ORAL_TABLET | Freq: Three times a day (TID) | ORAL | Status: AC

## 2022-04-14 MED ORDER — GABAPENTIN 100 MG PO CAPS: 100.0000 mg | ORAL_CAPSULE | Freq: Two times a day (BID) | ORAL | 2 refills | Status: AC

## 2022-04-14 MED ORDER — INSULIN ASPART 100 UNIT/ML IJ SOLN: INTRAMUSCULAR | 0 refills | Status: AC

## 2022-04-14 NOTE — Inpatient Diabetes Management (Signed)
Inpatient Diabetes Program Recommendations  AACE/ADA: New Consensus Statement on Inpatient Glycemic Control (2015)  Target Ranges:  Prepandial:   less than 140 mg/dL      Peak postprandial:   less than 180 mg/dL (1-2 hours)      Critically ill patients:  140 - 180 mg/dL   Lab Results  Component Value Date   GLUCAP 127 (H) 04/14/2022   HGBA1C 8.0 (H) 04/11/2022    Review of Glycemic Control  Latest Reference Range & Units 04/12/22 07:05 04/12/22 11:42 04/12/22 16:17 04/12/22 17:14 04/12/22 21:27 04/13/22 07:55 04/13/22 11:33  Glucose-Capillary 70 - 99 mg/dL 231 (H) 340 (H) 59 (L) 120 (H) 145 (H) 195 (H) 382 (H)    Latest Reference Range & Units 04/13/22 07:55 04/13/22 11:33 04/13/22 17:31 04/13/22 21:12 04/14/22 07:45  Glucose-Capillary 70 - 99 mg/dL 195 (H) 382 (H) 169 (H) 76 127 (H)   Diabetes history: Type 2 DM Outpatient Diabetes medications: Metformin 1000 mg BID Current orders for Inpatient glycemic control:  Novolog 0-20 units TID & HS Semglee 5 units BID  Solumedrol 40 mg BID Glucerna tid between meals  Note Semglee reduced yesterday from 15 units bid to 5 units bid due to hypoglycemia  Inpatient Diabetes Program Recommendations:    Postprandials exceeding inpatient goals due to steroids.   -  Consider adding Novolog 3 units TID (Assuming patient is consuming >50% of meals). -  Reduce Novolog Correction to 0-15 units tid + hs  Thanks, Tama Headings RN, MSN, BC-ADM Inpatient Diabetes Coordinator Team Pager [phone] (8a-5p)

## 2022-04-14 NOTE — TOC Transition Note (Signed)
Transition of Care [facility]) - CM/SW Discharge Note   Patient Details  Name: ADDALEIGH NICHOLLS MRN: [id number] Date of Birth: December 22, 1950  [facility] CM/SW Contact:  Iona Beard, [place] Phone Number: 04/14/2022, 10:57 AM   Clinical Narrative:    CSW updated that insurance Josem Kaufmann has been approved for SNF. CSW spoke to [place] who provided CSW with room and report numbers. CSW updated MD and RN of pts ability to D/C to facility today. CSW spoke to pts daughter to provide update of plan for D/C. CSW to call for transport when pt is ready. TOC signing off.   Final next level of care: Skilled Nursing Facility Barriers to Discharge: Barriers Resolved   Patient Goals and CMS Choice Patient states their goals for this hospitalization and ongoing recovery are:: go to SNF CMS Medicare.gov Compare Post Acute Care list provided to:: Patient Choice offered to / list presented to : Patient, Adult Children  Discharge Placement              Patient [facility] bed at: [facility] Patient to be transferred to facility by: [facility] Name of family member notified: Claiborne Billings (daughter) Patient and family notified of of transfer: 04/14/22  Discharge Plan and Services In-house Referral: Clinical Social Work Discharge Planning Services: CM Consult Post Acute Care Choice: [place]                               Social Determinants of Health (SDOH) Interventions     Readmission Risk Interventions     No data to display

## 2022-04-14 NOTE — Care Management Important Message (Signed)
Important Message  Patient Details  Name: Kristin Wells MRN: [id number] Date of Birth: 05-13-1950   Medicare Important Message Given:  Yes     Tommy Medal 04/14/2022, 11:25 AM

## 2022-04-14 NOTE — Discharge Summary (Signed)
Discharge Summary  AMAZIN PINCOCK PTW:[id number] DOB: 1951-02-02  PCP: Celene Squibb, MD  Admit date: 04/09/2022 Discharge date: 04/14/2022  30 Day Unplanned Readmission Risk Score    Flowsheet Row ED to Hosp-Admission (Current) from 04/09/2022 in [place]  30 Day Unplanned Readmission Risk Score (%) 26.04 Filed at 04/14/2022 0801       This score is the patient's risk of an unplanned readmission within 30 days of being discharged (0 -100%). The score is based on dignosis, age, lab data, medications, orders, and past utilization.   Low:  0-14.9   Medium: 15-21.9   High: 22-29.9   Extreme: 30 and above         Time spent: 53mns, more than 50% time spent on coordination of care. 320ms  Recommendations for Outpatient Follow-up:  F/u with SNF PCP within a week  for hospital discharge follow up, repeat cbc/bmp at follow up F/u with rad onc Dr MoLynnette Caffeyor palliative radiation F/u with oncology Dr kaWorthy Keelers needed Palliative care to continue to follow patient, there is a plan to transition to hospice once she finish palliative radiation treatment     Discharge Diagnoses:  Active Hospital Problems   Diagnosis Date Noted   Generalized weakness 04/10/2022   Protein-calorie malnutrition, severe 04/13/2022   Failure to thrive in adult 04/10/2022   Iron deficiency anemia 04/10/2022   Chronic kidney disease, stage 3a ([facility]12/07/2021   Elevated brain natriuretic peptide (BNP) level 04/10/2022   Elevated d-dimer 04/10/2022   Lactic acidosis 04/10/2022   GERD (gastroesophageal reflux disease) 04/10/2022   Hypoalbuminemia due to protein-calorie malnutrition ([facility]12/07/2021   Medullary thyroid carcinoma ([facility]/18/2022   Type 2 diabetes mellitus with hyperglycemia ([facility]   Mixed hyperlipidemia    Chronic obstructive pulmonary disease, unspecified ([facility]   Chronic respiratory failure with hypoxia ([facility]05/29/2014   Depression 02/04/2010    Resolved  Hospital Problems  No resolved problems to display.    Discharge Condition: stable  Diet recommendation: carb modified  Filed Weights   04/12/22 0539 04/13/22 0500 04/14/22 0500  Weight: 42.7 kg 42.1 kg 41.9 kg    History of present illness: ( per admitting MD Dr AdJosephine CablesHPI: Kristin BRODTs a [age].o. 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  Patient was recently admitted to [facility] due to concern for cord compression and hypoxia, but she left AMA because she did not want to go to the nursing home on discharge.  On returning home, she was weak and unable to ambulate due to high risk of fall, so she ended up being unkempt, but she was alert and oriented x 3, she denies chest pain, bilateral upper and lower extremity numbness or tingling, nausea or vomiting.     ED Course:  In the emergency department, she was tachycardic, tachypneic and BP was 91/74.  Workup in the ED showed CBC: WBC 13.7, hemoglobin 14.0, hematocrit 43.6, MCV 2.2, platelets 137 BMP: Sodium 133, potassium 3.7, chloride 88, bicarb 27, glucose 364, BUN/creatinine 63/1.22 (baseline creatinine 0.9-1.1),  Albumin 3.3.  D-dimer > 20, troponin x 2 was flat at 15, BNP 514,  lactic acid 2.6, urinalysis was unimpressive for UTI. CT angiography chest with contrast showed no evidence of pulmonary embolism.  Stable 2 mm LLL pulmonary nodule.  Stable enlarged high right paratracheal lymph node.  Stable osseous metastatic disease Chest x-ray showed no signs of pulmonary edema or new focal infiltrates. IV hydration was provided.  Hospitalist was asked to admit patient  for further evaluation and management.  Hospital Course:  Principal Problem:   Generalized weakness Active Problems:   Depression   Chronic respiratory failure with hypoxia (HCC)   Mixed hyperlipidemia   Chronic obstructive pulmonary disease, unspecified (HCC)   Type 2 diabetes mellitus with hyperglycemia (HCC)   Medullary thyroid carcinoma (HCC)   Failure to thrive in adult   Iron deficiency anemia   Chronic kidney disease, stage 3a (HCC)   Elevated brain natriuretic peptide (BNP) level   Elevated d-dimer   Lactic acidosis   GERD (gastroesophageal reflux disease)   Hypoalbuminemia due to protein-calorie malnutrition (HCC)   Protein-calorie malnutrition, severe   Assessment and Plan:  Generalized weakness and failure to thrive -Physical deconditioning, Severe malnutrition -Stage IV medullary cancer status post total thyroidectomy with bony metastases with recent diagnosis of cord compression treated with Decadron -She was recently hospitalized at [facility] due to concern for cord compression and hypoxia with recommendations for SNF placement but patient left AMA because she did not want to go to nursing home on discharge.  However, she  presented with generalized weakness and unable to take care of herself. -Reviewed in care everywhere notes from [facility]: There was some discussion about possibility of hospice involvement.   -seen by palliative care here, she is DNR, she now is agreeing to SNF placement and desires for XRT treatment to her back, then transition to hospice care after XRt treatment -case discussed with oncology Dr Delton Coombes who agree with continue steroids, Dr Delton Coombes will notify [facility] rad onc Dr  Sharyn Dross   Dehydration/poor oral intake -with hyponatremia, hypotension -improved with vf, start low dose midodrine, continue steroids, encourage oral intake    Chronic diastolic heart failure  -Echo on May 27, 2021 had shown  EF of 65 to 70% with grade 1 diastolic dysfunction -Required IV Lasix during recent hospitalization at [facility].  appear was discharged from [facility] on oral Lasix at home.   --I have reviewed cta from 12/2, did not show  pulmonary edema or pleural effusion  - she appear dehydrated currently -Outpatient follow-up with cardiology if needed    Acute on chronic respiratory failure with hypoxia/COPD -Normally wears 2 L oxygen via nasal cannula.  Oxygen requirement improving to 2 L oxygen.  -received  Solu-Medrol along with nebs -Incentive spirometry/flutter valve   Lactic acidosis -Possibly from all of the above.  Resolved   Leukocytosis -resolved   AKI on CKD stage IIIa -cr improved and normalized .     Thrombocytopenia/anemia -Questionable cause, possible poor nutrition and malignancy -  No signs of bleeding.  Monitor intermittently   Diabetes mellitus type 2 with hyperglycemia with hypoglycemia -Continue CBGs with SSI.   -Patient was recently on Decadron as an outpatient.  Carb modified diet.       Depression -Continue Cymbalta   Acquired hypothyroidism -Continue Synthroid   FTT, poor prognosis, palliative care to continue follow at SNF, she is DNR     Discharge Exam: BP (!) 89/65 (BP Location: Right Arm)   Pulse 87   Temp 97.7 F (36.5 C)  Resp 16   Ht 5' (1.524 m)   Wt 41.9 kg   SpO2 98%   BMI 18.04 kg/m   General: frail, NAD, aaox3 Cardiovascular: RRR, no edema  Respiratory: normal respiratory effort     Discharge Instructions     Diet Carb Modified   Complete by: As directed    Insulin dependent diabetes   Increase activity slowly   Complete by: As directed       Allergies as of 04/14/2022       Reactions   Cat Hair Extract Cough, Other (See Comments)   sneezing   Other Cough, Other (See Comments)   Coughing and sneezing d/t some cats, dogs and perfumes        Medication List     STOP taking these medications    ALPRAZolam  0.25 MG tablet Commonly known as: XANAX   aspirin EC 81 MG tablet   cyclobenzaprine 10 MG tablet Commonly known as: FLEXERIL   ergocalciferol 1.25 MG (50000 UT) capsule Commonly known as: VITAMIN D2   ondansetron 4 MG disintegrating tablet Commonly known as: ZOFRAN-ODT   potassium chloride 10 MEQ tablet Commonly known as: KLOR-CON   rosuvastatin 20 MG tablet Commonly known as: CRESTOR   sertraline 50 MG tablet Commonly known as: ZOLOFT   traMADol 50 MG tablet Commonly known as: ULTRAM   zolpidem 5 MG tablet Commonly known as: AMBIEN       TAKE these medications    albuterol 108 (90 Base) MCG/ACT inhaler Commonly known as: VENTOLIN HFA Inhale into the lungs.   blood glucose meter kit and supplies Kit Dispense based on patient and insurance preference. Use up to four times daily as directed. (FOR ICD-9 250.00, 250.01).   Breztri Aerosphere 160-9-4.8 MCG/ACT Aero Generic drug: Budeson-Glycopyrrol-Formoterol Inhale into the lungs.   calcium carbonate 500 MG chewable tablet Commonly known as: Tums Chew 2 tablets (400 mg of elemental calcium total) by mouth 2 (two) times daily.   cetirizine 10 MG tablet Commonly known as: ZYRTEC Take 10 mg by mouth daily.   dexamethasone 4 MG tablet Commonly known as: DECADRON Take 4 mg by mouth See admin instructions. Take 1 tablet (69m) by mouth 4 times daily every evening with meals . Continue until you F/U with oncologist   DULoxetine 60 MG capsule Commonly known as: CYMBALTA Take 60 mg by mouth daily.   ferrous sulfate 325 (65 FE) MG tablet Take 1 tablet (325 mg total) by mouth every other day. Start taking on: April 16, 2022   furosemide 20 MG tablet Commonly known as: LASIX Take 1 tablet (20 mg total) by mouth daily as needed for edema or fluid. What changed:  when to take this reasons to take this   gabapentin 100 MG capsule Commonly known as: Neurontin Take 1 capsule (100 mg total) by mouth 2 (two) times  daily.   Glucerna Liqd Take 237 mLs by mouth 3 (three) times daily between meals.   guaiFENesin 600 MG 12 hr tablet Commonly known as: MUCINEX Take 1 tablet (600 mg total) by mouth 2 (two) times daily.   insulin aspart 100 UNIT/ML injection Commonly known as: novoLOG Insulin sliding scale: Blood sugar  120-150   3units                       151-200   4units  201-250   7units                       251- 300  11units                       301-350   15uints                       351-400   20units                       >400         call MD immediately   levothyroxine 100 MCG tablet Commonly known as: SYNTHROID Take 100 mcg by mouth daily. What changed: Another medication with the same name was removed. Continue taking this medication, and follow the directions you see here.   metFORMIN 500 MG tablet Commonly known as: GLUCOPHAGE Take 1,000 mg by mouth 2 (two) times daily with a meal. Extended release   midodrine 2.5 MG tablet Commonly known as: PROAMATINE Take 1 tablet (2.5 mg total) by mouth 3 (three) times daily with meals.   naloxone 4 MG/0.1ML Liqd nasal spray kit Commonly known as: NARCAN SMARTSIG:1 Both Nares Daily   oxyCODONE 5 MG immediate release tablet Commonly known as: Roxicodone Take 1 tablet (5 mg total) by mouth every 4 (four) hours as needed for up to 7 days.   pantoprazole 40 MG tablet Commonly known as: PROTONIX Take 40 mg by mouth daily.   Poise Pantiliners Pads 1 each by Does not apply route as needed.   senna-docusate 8.6-50 MG tablet Commonly known as: Senokot-S Take 1 tablet by mouth 2 (two) times daily. Hold if diarrhea       Allergies  Allergen Reactions   Cat Hair Extract Cough and Other (See Comments)    sneezing   Other Cough and Other (See Comments)    Coughing and sneezing d/t some cats, dogs and perfumes    Contact information for follow-up providers     Renaldo Harrison, MD Follow up.   Specialty:  Radiation Oncology Contact information: [place] [address] [phone]              Contact information for after-discharge care     Destination     [facility] .   Service: Skilled Nursing Contact information: [address] [place] [phone]                      The results of significant diagnostics from this hospitalization (including imaging, microbiology, ancillary and laboratory) are listed below for reference.    Significant Diagnostic Studies: CT Angio Chest PE W and/or Wo Contrast  Result Date: 04/09/2022 CLINICAL DATA:  High probability for PE.  Medullary thyroid cancer. EXAM: CT ANGIOGRAPHY CHEST WITH CONTRAST TECHNIQUE: Multidetector CT imaging of the chest was performed using the standard protocol during bolus administration of intravenous contrast. Multiplanar CT image reconstructions and MIPs were obtained to evaluate the vascular anatomy. RADIATION DOSE REDUCTION: This exam was performed according to the departmental dose-optimization program which includes automated exposure control, adjustment of the mA and/or kV according to patient size and/or use of iterative reconstruction technique. CONTRAST:  66m OMNIPAQUE IOHEXOL 350 MG/ML SOLN COMPARISON:  CT chest abdomen and pelvis 03/15/2022 FINDINGS: Cardiovascular: Satisfactory opacification of the pulmonary arteries to the segmental level. No evidence  of pulmonary embolism. Normal heart size. No pericardial effusion. There are atherosclerotic calcifications of the aorta and coronary arteries. Mediastinum/Nodes: Thyroid gland is surgically absent. Enlarged high right paratracheal lymph node measuring 10 mm short axis is unchanged. No new enlarged lymph nodes. Esophagus within normal limits. Lungs/Pleura: Mild emphysema is again seen. There is minimal atelectasis in the left lung base. There is a 2 mm nodule in the left lower  lobe image 6/63. This is unchanged. No new pulmonary nodules are seen. The lungs are otherwise clear. There is no pleural effusion or pneumothorax. Upper Abdomen: No acute abnormality. Musculoskeletal: T4 sclerotic lesion extending into the pedicle on the right appears unchanged. Sclerosis of the C7 vertebral body appears unchanged. Lytic lesion in the sternum with soft tissue component appears unchanged. Review of the MIP images confirms the above findings. IMPRESSION: 1. No evidence for pulmonary embolism. 2. Stable 2 mm left lower lobe pulmonary nodule. 3. Stable enlarged high right paratracheal lymph node. 4. Stable osseous metastatic disease. Aortic Atherosclerosis (ICD10-I70.0) and Emphysema (ICD10-J43.9). Electronically Signed   By: Ronney Asters M.D.   On: 04/09/2022 20:26   DG Chest 2 View  Result Date: 04/09/2022 CLINICAL DATA:  Shortness of breath EXAM: CHEST - 2 VIEW COMPARISON:  Radiograph done on 09/16/2021, CT done on 03/15/2022 FINDINGS: Cardiac size is within normal limits. Lung fields are clear of any infiltrates or pulmonary edema. There is no pleural effusion or pneumothorax. Increase in AP diameter of chest suggests COPD. IMPRESSION: There are no signs of pulmonary edema or new focal infiltrates. Electronically Signed   By: Elmer Picker M.D.   On: 04/09/2022 18:05   CT CHEST ABDOMEN PELVIS W CONTRAST  Result Date: 03/17/2022 CLINICAL DATA:  Medullary thyroid cancer; * Tracking Code: BO * EXAM: CT CHEST, ABDOMEN, AND PELVIS WITH CONTRAST TECHNIQUE: Multidetector CT imaging of the chest, abdomen and pelvis was performed following the standard protocol during bolus administration of intravenous contrast. RADIATION DOSE REDUCTION: This exam was performed according to the departmental dose-optimization program which includes automated exposure control, adjustment of the mA and/or kV according to patient size and/or use of iterative reconstruction technique. CONTRAST:  123m OMNIPAQUE  IOHEXOL 300 MG/ML  SOLN COMPARISON:  CT chest, abdomen and pelvis dated November 16, 2021 FINDINGS: CT CHEST FINDINGS Cardiovascular: Normal heart size. Trace pericardial effusion. Normal caliber thoracic aorta with severe atherosclerotic disease. Mediastinum/Nodes: Esophagus is unremarkable. Prior thyroidectomy. Right supraclavicular lymph node measuring 0.6 cm in short axis on series 2, image 6, unchanged. Retrotracheal lymph node measuring 1.1 cm in short axis on series 2, image 110, previously measured 0.9 mm. Additional adjacent right upper paratracheal lymph node measuring 0.7 cm in short axis on series 2, image 11, previously 0.6 cm. Lungs/Pleura: Central airways are patent. Moderate centrilobular emphysema. No consolidation, pleural effusion or pneumothorax. New tiny solid pulmonary nodules are seen throughout the lungs. Reference new solid pulmonary nodule of the left lower lobe measuring 3 mm on series 3, image 61. Reference new solid pulmonary nodule of the left upper lobe measuring 3 mm on series 3 38. Other bilateral solid pulmonary nodules are stable. Reference nodule of the left lower lobe measuring 4 mm on image 104, unchanged. Musculoskeletal: Lytic lesion of the sternum with osseous destruction and soft tissue component, unchanged in appearance when compared with prior exam. Similar sclerotic lesions of C7. T4 sclerotic lesion extending into the posterior elements, increased in size when compared with prior. Unchanged lucent lesion of L1. CT ABDOMEN PELVIS FINDINGS Hepatobiliary:  No focal liver abnormality is seen. No gallstones, gallbladder wall thickening, or biliary dilatation. Pancreas: Unremarkable. No pancreatic ductal dilatation or surrounding inflammatory changes. Spleen: Normal in size without focal abnormality. Adrenals/Urinary Tract: Adrenal glands are unremarkable. Unchanged bilateral renal scarring. No hydronephrosis. Bladder is unremarkable. Stomach/Bowel: Stomach is within normal limits.  Appendix appears normal. No evidence of bowel wall thickening, distention, or inflammatory changes. Vascular/Lymphatic: Aortic atherosclerosis. No enlarged abdominal or pelvic lymph nodes. Reproductive: Hyperenhancing lesion of the uterus measuring 1.2 cm on series 2, image 87, likely a fibroid and not as well seen on prior exam due to differences in contrast timing. Other: No abdominal wall hernia or abnormality. No abdominopelvic ascites. Musculoskeletal: Moderate degenerative disc disease of the lumbar spine. Aggressive appearing osseous lesions described above. IMPRESSION: 1. Retrotracheal and right upper paratracheal lymph nodes are slightly increased in size when compared with prior exam, concerning for nodal metastatic disease. 2. New scattered tiny solid pulmonary nodules, possibly due to infectious or inflammatory process, metastatic disease is not excluded. 3. T4 sclerotic lesion demonstrates increased sclerosis, concerning for osseous metastatic disease. Other osseous lesions, including destructive sternal lesion are unchanged when compared with prior. 4. No evidence of metastatic disease in the abdomen or pelvis. 5. Aortic Atherosclerosis (ICD10-I70.0) and Emphysema (ICD10-J43.9). Electronically Signed   By: Yetta Glassman M.D.   On: 03/17/2022 18:26    Microbiology: Recent Results (from the past 240 hour(s))  Blood culture (routine x 2)     Status: None   Collection Time: 04/09/22  6:15 PM   Specimen: Right Antecubital; Blood  Result Value Ref Range Status   Specimen Description   Final    RIGHT ANTECUBITAL BOTTLES DRAWN AEROBIC AND ANAEROBIC   Special Requests Blood Culture adequate volume  Final   Culture   Final    NO GROWTH 5 DAYS Performed at [facility], [address] [place], [place] [address]    Report Status 04/14/2022 FINAL  Final  Blood culture (routine x 2)     Status: None   Collection Time: 04/09/22  7:45 PM   Specimen: BLOOD LEFT FOREARM  Result Value Ref Range  Status   Specimen Description   Final    BLOOD LEFT FOREARM BOTTLES DRAWN AEROBIC AND ANAEROBIC   Special Requests Blood Culture adequate volume  Final   Culture   Final    NO GROWTH 5 DAYS Performed at [facility], [address] [place], [place] [address]    Report Status 04/14/2022 FINAL  Final     Labs: Basic Metabolic Panel: Recent Labs  Lab 04/10/22 0343 04/11/22 0346 04/11/22 1415 04/11/22 1754 04/12/22 0558 04/13/22 0525 04/14/22 0624  NA 132* 132*  --   --  130* 130* 135  K 3.9 3.7  --   --  4.6 4.3 4.1  CL 89* 90*  --   --  87* 91* 98  CO2 28 27  --   --  _0 GLUCOSE 282* 217* 412* 371* 211* 187* 100*  BUN 56* 63*  --   --  71* 57* 45*  CREATININE 1.16* 1.08*  --   --  1.15* 0.91 0.72  CALCIUM 9.1 9.1  --   --  9.1 8.7* 8.3*  MG 2.4 2.5*  --   --  2.4 2.4  --   PHOS 4.3  --   --   --   --   --   --    Liver Function Tests: Recent Labs  Lab  04/09/22 1819 04/10/22 0343  AST 50* 48*  ALT 15 12  ALKPHOS 117 114  BILITOT 1.1 1.0  PROT 7.7 7.0  ALBUMIN 3.3* 3.1*   No results for input(s): "LIPASE", "AMYLASE" in the last 168 hours. No results for input(s): "AMMONIA" in the last 168 hours. CBC: Recent Labs  Lab 04/09/22 1819 04/10/22 0343 04/11/22 0346 04/13/22 0525 04/14/22 0624  WBC 13.7* 12.0* 10.0 9.7 9.0  NEUTROABS 12.1*  --  9.1* 8.7* 7.8*  HGB 14.0 12.9 11.6* 9.7* 8.9*  HCT 43.6 40.0 35.6* 30.4* 28.0*  MCV 81.2 81.3 81.8 80.9 83.1  PLT 137* 122* 110* 111* 125*   Cardiac Enzymes: No results for input(s): "CKTOTAL", "CKMB", "CKMBINDEX", "TROPONINI" in the last 168 hours. BNP: BNP (last 3 results) Recent Labs    04/09/22 1819 04/14/22 0624  BNP 514.0* 840.0*    ProBNP (last 3 results) No results for input(s): "PROBNP" in the last 8760 hours.  CBG: Recent Labs  Lab 04/13/22 0755 04/13/22 1133 04/13/22 1731 04/13/22 2112 04/14/22 0745  GLUCAP 195* 382* 169* 76 127*    FURTHER DISCHARGE INSTRUCTIONS:   Get Medicines  reviewed and adjusted: Please take all your medications with you for your next visit with your Primary MD   Laboratory/radiological data: Please request your Primary MD to go over all hospital tests and procedure/radiological results at the follow up, please ask your Primary MD to get all Hospital records sent to his/her office.   In some cases, they will be blood work, cultures and biopsy results pending at the time of your discharge. Please request that your primary care M.D. goes through all the records of your hospital data and follows up on these results.   Also Note the following: If you experience worsening of your admission symptoms, develop shortness of breath, life threatening emergency, suicidal or homicidal thoughts you must seek medical attention immediately by calling 911 or calling your MD immediately  if symptoms less severe.   You must read complete instructions/literature along with all the possible adverse reactions/side effects for all the Medicines you take and that have been prescribed to you. Take any new Medicines after you have completely understood and accpet all the possible adverse reactions/side effects.    Do not drive when taking Pain medications or sleeping medications (Benzodaizepines)   Do not take more than prescribed Pain, Sleep and Anxiety Medications. It is not advisable to combine anxiety,sleep and pain medications without talking with your primary care practitioner   Special Instructions: If you have smoked or chewed Tobacco  in the last 2 yrs please stop smoking, stop any regular Alcohol  and or any Recreational drug use.   Wear Seat belts while driving.   Please note: You were cared for by a hospitalist during your hospital stay. Once you are discharged, your primary care physician will handle any further medical issues. Please note that NO REFILLS for any discharge medications will be authorized once you are discharged, as it is imperative that you  return to your primary care physician (or establish a relationship with a primary care physician if you do not have one) for your post hospital discharge needs so that they can reassess your need for medications and monitor your lab values.     Signed:  Florencia Reasons MD, PhD, FACP  [facility] 04/14/2022, 11:32 AM

## 2022-04-15 ENCOUNTER — Telehealth: Payer: Self-pay | Admitting: "Endocrinology

## 2022-04-15 ENCOUNTER — Other Ambulatory Visit: Payer: Self-pay | Admitting: "Endocrinology

## 2022-04-15 DIAGNOSIS — E890 Postprocedural hypothyroidism: Secondary | ICD-10-CM

## 2022-04-15 NOTE — Telephone Encounter (Signed)
Can you place lab orders for this pt? I will fax it to the facility she is currently residing at [phone] [place]

## 2022-04-18 ENCOUNTER — Ambulatory Visit: Payer: Self-pay | Admitting: *Deleted

## 2022-04-18 DIAGNOSIS — G9520 Unspecified cord compression: Secondary | ICD-10-CM | POA: Insufficient documentation

## 2022-04-18 DIAGNOSIS — R970 Elevated carcinoembryonic antigen [CEA]: Secondary | ICD-10-CM | POA: Diagnosis not present

## 2022-04-18 DIAGNOSIS — G9529 Other cord compression: Secondary | ICD-10-CM | POA: Diagnosis not present

## 2022-04-18 DIAGNOSIS — C7951 Secondary malignant neoplasm of bone: Secondary | ICD-10-CM | POA: Diagnosis not present

## 2022-04-18 DIAGNOSIS — Z9089 Acquired absence of other organs: Secondary | ICD-10-CM | POA: Diagnosis not present

## 2022-04-18 DIAGNOSIS — C73 Malignant neoplasm of thyroid gland: Secondary | ICD-10-CM | POA: Diagnosis not present

## 2022-04-18 DIAGNOSIS — G834 Cauda equina syndrome: Secondary | ICD-10-CM | POA: Insufficient documentation

## 2022-04-18 DIAGNOSIS — Z923 Personal history of irradiation: Secondary | ICD-10-CM | POA: Diagnosis not present

## 2022-04-18 NOTE — Patient Outreach (Signed)
  Care Coordination   04/18/2022 Name: Kristin Wells MRN: [id number] DOB: 21-Jul-1950   Care Coordination Outreach Attempts:  An unsuccessful telephone outreach was attempted today to offer the patient information about available care coordination services as a benefit of their health plan.  Her voice mail box is full  Follow Up Plan:  Additional outreach attempts will be made to offer the patient care coordination information and services.   Encounter Outcome:  No Answer   Care Coordination Interventions:  No, not indicated     Bliss Tsang L. Lavina Hamman, RN, BSN, [place] Coordinator Office number [phone]

## 2022-04-19 ENCOUNTER — Encounter: Payer: Self-pay | Admitting: *Deleted

## 2022-04-19 NOTE — Progress Notes (Signed)
Per Dr. Delton Coombes, this patient will be treated with palliative radiation in [facility] then transfer to [place].

## 2022-04-21 ENCOUNTER — Ambulatory Visit: Payer: Self-pay | Admitting: *Deleted

## 2022-04-21 NOTE — Patient Outreach (Signed)
  Care Coordination   Follow Up Visit Note   04/22/2022 Name: Kristin Wells MRN: [id number] DOB: 02-Aug-1950  Kristin Wells is a 71 y.o. year old female who sees Nevada Crane, Edwinna Areola, MD for primary care. I spoke with  Kristin Wells by phone today.  What matters to the patients health and wellness today?  Pain, Depression and limited ability to tolerate facility therapy sessions  Kristin Wells expresses today that she continues to go to oncology visits and then when she returns to [facility], she has to have therapy when she is tired and really would like to rest. She reports this is leading to her pain and depression. She reports "I was out all day for my cancer treatments and I was tired"  " I lay in clothes all day" "seems like they been pulling on me". She reports her legs are weak and "they give out" She reports she "walked the whole facility on yesterday" " I know I can do it but at my own pace." She reports having incontinent episodes "I don't know that I am going"    Goals Addressed               This Visit's Progress     Patient Stated     Manage treatments for thyroid cancer & rehab at Facility [facility]) (pt-stated)   Not on track     Care Coordination Interventions: Assessed patient understanding of cancer diagnosis and recommended treatment plan Assessed support system. Has consistent/reliable family or other support: Yes Outreach to [employer] and reviewed with her the concerns Kristin Peffley stated to RN CM about her pain, depression and limited ability to tolerate the facility therapies at this time.   Chrys Racer shared with RN CM the insurance requirements for Kristin Wells to be covered for facility therapy.  RN CM acknowledged  these standard requirements. Shared with Chrys Racer that RN CM was advocating the patient's voiced concerns. RN CM inquired if passive range of motion therapy was a possibility for the patient to have after returning from her oncology treatments  to compromise with her. Chrys Racer voiced not being sure if this would occur as she is "not a physical therapist" Confirmed with patient that she remains active with [facility] SW for assistance with her depression  Notes to be forward to pcp         SDOH assessments and interventions completed:  No     Care Coordination Interventions:  Yes, provided   Follow up plan: Follow up call scheduled for 05/05/22    Encounter Outcome:  Pt. Visit Completed   Sirena Riddle L. Lavina Hamman, RN, BSN, [place] Coordinator Office number [phone]

## 2022-04-22 ENCOUNTER — Encounter: Payer: Self-pay | Admitting: *Deleted

## 2022-04-22 ENCOUNTER — Ambulatory Visit: Payer: Self-pay | Admitting: *Deleted

## 2022-04-22 NOTE — Patient Outreach (Signed)
Care Coordination   Follow Up Visit Note   04/22/2022  Name: Kristin Wells MRN: [id number] DOB: 1950/11/03  Kristin Wells is a 71 y.o. year old female who sees Nevada Crane, Edwinna Areola, MD for primary care. I spoke with patient's daughter, Kristin Wells by phone today.  What matters to the patients health and wellness today?   Assist with [place].   Goals Addressed               This Visit's Progress     Assist with [place]. (pt-stated)   On track     Care Coordination Interventions:  Solution-Focused Strategies Implemented. Deep Breathing Exercises, Relaxation Techniques & Mindfulness Meditation Strategies Reviewed & Encouraged Daily.   Active Listening/Reflection Utilized.  Emotional and Caregiver Support Provided. Verbalization of Feelings Encouraged.  Caregiver Stress Acknowledged. Caregiver Resources Reviewed. Self-Enrollment in Caregiver Support Group Emphasized. Problem Solving Interventions Activated. Task-Centered Solutions Employed.   Higher Level of Care Options Reviewed with Daughter, and List of Long-Term Care Assisted Living Facilities Provided. Reviewed the Following List of Agencies and Resources with Daughter, to Ensure Understanding:             Kristin Wells Encouraged Daughter to Complete Personal Care Services Application, Notifying CSW Directly [phone]), If She Has Questions or Needs Assistance with Application Completion. Once [place] Application is Completed, CSW Will Submit to Primary Care Provider, Dr. Allyn Kenner, For Review and Signature.     CSW Will Media planner to [facility] [phone]), for Processing, Once Completed, Reviewed and Signed by Primary Care Provider, Dr. Allyn Kenner.  Continue to  [employer] Through [place] [phone]). Continue to [place] at [facility] [phone]). CSW Collaboration with Medicaid Worker, Through the [place] [phone]), to Request New EBT Card.   Care Coordination Interventions:  CSW Collaboration with Attending Provider, Dr. Florencia Reasons to Encourage Direct Contact with Kristin Wells, [facility] Social Worker, Currently Involved with Patient's Care, [employer] CSW Collaboration with Kristin Wells, [facility] Social Worker, [employer], to [employer] About [employer] for Patient. Bed Offer Received from [place], Admissions Coordinator with [facility] [phone]), Pending Insurance Authorization and Approval. Patient and Daughter, Kristin Wells Accepted Bed Offer at [facility] [phone]).    Care Coordination Interventions:  Assessed Social Determinant of Health Barriers. Discussed Plans for Ongoing Care Management Follow Up. Provided with Direct Contact Information for Care Management Team. Screened for Signs & Symptoms of Depression Related to Chronic Disease State.  EHM0/NOB0 Depression Screen Completed & Results Reviewed.  Suicidal Ideation/Homicidal Ideation Assessed - None Present. Solution-Focused Strategies Employed. Deep Breathing Exercises, Relaxation Techniques & Mindfulness Meditation Strategies Taught & Encouraged Daily.   Active Listening/Reflection Utilized.  Emotional Support Provided. Verbalization of Feelings Encouraged.  Caregiver Stress Acknowledged. Caregiver Resources Reviewed. Problem Solving/Task-Centered Solutions Developed.   Psychoeducation for Mental Health Concerns Initiated. Reviewed Mental Health Medications & Discussed  Importance of Compliance. Quality of Sleep Assessed & Sleep Hygiene Techniques Promoted. Participation in Counseling Encouraged. Discussed Referral to Psychiatrist for Psychotropic Medication Management. Discussed Referral to Therapist for Psychotherapeutic Counseling Services. Mailed the Following List of Agencies and Resources, on 04/06/2022:  [place] Instructions Personal Care Services Wells Encouraged to Review the List of Agencies and Resources Provided, and Be Prepared to Discuss During Our Next Scheduled Telephone Outreach Call. CSW Collaboration with Kristin Wells, [place] with [facility] [phone]), Where Patient Currently Resides, to Discuss Discharge Disposition and to Assist with Discharge Planning Needs. ~ Kristin Wells to [place], No Home Health Agencies Available (Under [employer]). ~ [place] Refusing Placement, of Any Kind, at Present Time. ~ Outpatient Therapies (Physical and Occupational) Recommended. Continue to [employer] Through [place] [phone]).   Care Coordination Interventions:  Talked with Kristin Wells about Care Coordination program support Encouraged Kristin Wells to talk with PCP about program support. Discussed insurance of client. She has [place] and Medicaid. Discussed fall risk of client. Client said she has fallen 5 times in past month. She said her legs feel weak and legs give way some time.She has problem getting back up and down. Uses walker. She has pain in her knees.  She uses elevator frequently in building.   Discussed Transport needs. She said she uses [employer] with lift on van to help her go to and from medical appointments. Reviewed medications procurement of client.   Reviewed vision of client. She said  she has vision decrease. Client said she has difficulty standing up.   Client eats sandwiches often. Uses microwave to warm up food.  Discussed family support. She said she has limited family support Provided counseling support for client. Discussed relaxation techniques with client Client and LCSW spoke of her cancer treatments. She spoke of cancer treatments and was very fatigued after cancer treatments. She said she has had 10 cancer treatments.  Discussed her feelings about managing cancer treatments. She said she thinks she is scheduled for a type of scan soon .  Discussed art therapy.  She likes to do art drawing if able and has done art drawing in the past.  She tries to take naps as part of relaxation approach.   Discussed finances of client.  Client said she has Representative Payee that helps her manage client finances. Client has difficulty getting up and getting to door at her apartment. It takes her a little while to get to her door.   Talked with client about ADLs completion.  She takes sink bath regularly to help with bathing Client spoke of her cancer treatments (10 treatments received).   Reviewed sleeping challenges. She has sleeping difficulty Talked of her next appointment at office of PCP on April 11 2022. Client said she would like RN with Care Coordination program to call her to discuss her nursing needs.  Discussed dental needs of client. She eats soft foods. She has difficulty paying copays for medical or dental visits. LCSW to schedule RN  phone visit with client LCSW  Theadore Nan to schedule client phone visit with Nat Christen who is designated LCSW for client going forward         SDOH assessments and interventions completed:  Yes.  Care Coordination Interventions:  Yes, provided.   Follow up plan: Follow up call scheduled for 05/10/2021 at 9:00 am.  Encounter Outcome:  Pt. Visit Completed.   Nat Christen, BSW, MSW, LCSW  Licensed [facility]  Mailing [place] [address] [place],  [address] Physical Priscella Mann  [address], [place], [place] [address] Toll Free Main # [phone] Fax # [phone] Cell # [phone] [email or web address]

## 2022-04-22 NOTE — Patient Instructions (Signed)
Visit Information  Thank you for taking time to visit with me today. Please don't hesitate to contact me if I can be of assistance to you.   Following are the goals we discussed today:   Goals Addressed               This Visit's Progress     Assist with [place]. (pt-stated)   On track     Care Coordination Interventions:  Solution-Focused Strategies Implemented. Deep Breathing Exercises, Relaxation Techniques & Mindfulness Meditation Strategies Reviewed & Encouraged Daily.   Active Listening/Reflection Utilized.  Emotional and Caregiver Support Provided. Verbalization of Feelings Encouraged.  Caregiver Stress Acknowledged. Caregiver Resources Reviewed. Self-Enrollment in Caregiver Support Group Emphasized. Problem Solving Interventions Activated. Task-Centered Solutions Employed.   Higher Level of Care Options Reviewed with Daughter, and List of Long-Term Care Assisted Living Facilities Provided. Reviewed the Following List of Agencies and Resources with Daughter, to Ensure Understanding:             [place] Instructions Personal Care Services Providers Encouraged Daughter to Complete Personal Care Services Application, Notifying CSW Directly [phone]), If She Has Questions or Needs Assistance with Application Completion. Once [place] Application is Completed, CSW Will Submit to Primary Care Provider, Dr. Allyn Kenner, For Review and Signature.     CSW Will Media planner to [facility] [phone]), for Processing, Once Completed, Reviewed and Signed by Primary Care Provider, Dr. Allyn Kenner.  Continue to [employer] [place] [phone]). Continue to [place] at [facility] [phone]). CSW Collaboration with Medicaid Worker, Through the [place] [phone]), to Request New EBT Card.   Care Coordination Interventions:  CSW Collaboration with Attending Provider, Dr. Florencia Reasons to Encourage Direct Contact with Iona Beard, [facility] Social Worker, Currently Involved with Patient's Care, [employer] CSW Collaboration with Iona Beard, [facility] Social Worker, [employer], to [employer] About WellPoint for Patient. Bed Offer Received from [place], Admissions Coordinator with [facility] [phone]), Pending Insurance Authorization and Approval. Patient and Daughter, Marcell Barlow Accepted Bed Offer at [facility] [phone]).    Care Coordination Interventions:  Assessed Social Determinant of Health Barriers. Discussed Plans for Ongoing Care Management Follow Up. Provided with Direct Contact Information for Care Management Team. Screened for Signs & Symptoms of Depression Related to Chronic Disease State.  WPV9/YIA1 Depression Screen Completed & Results Reviewed.  Suicidal Ideation/Homicidal Ideation Assessed - None Present. Solution-Focused Strategies Employed. Deep Breathing Exercises, Relaxation Techniques & Mindfulness Meditation Strategies Taught & Encouraged Daily.   Active Listening/Reflection Utilized.  Emotional Support Provided. Verbalization of Feelings Encouraged.  Caregiver Stress Acknowledged. Caregiver Resources Reviewed. Problem Solving/Task-Centered Solutions Developed.   Psychoeducation for Mental Health Concerns Initiated. Reviewed Mental Health Medications & Discussed Importance of Compliance. Quality of Sleep Assessed & Sleep Hygiene Techniques Promoted. Participation in Counseling Encouraged. Discussed Referral to Psychiatrist for Psychotropic Medication  Management. Discussed Referral to Therapist for Psychotherapeutic Counseling Services. Mailed the Following List of Agencies and Resources, on 04/06/2022:             [place] Instructions Personal Care Services  Providers Encouraged to Review the List of Agencies and Resources Provided, and Be Prepared to Discuss During Our Next Scheduled Telephone [employer]. CSW Collaboration with [place], [place] with [facility] [phone]), Where Patient Currently Resides, to Discuss Discharge Disposition and to Assist with Discharge Planning Needs. ~ Madelin Headings to [place], No Home Health Agencies Available (Under [employer]). ~ [place] Refusing Placement, of Any Kind, at Present Time. ~ Outpatient Therapies (Physical and Occupational) Recommended. Continue to [employer] [place] [phone]).   Care Coordination Interventions:  Talked with Earlie Server about Care Coordination program support Encouraged Janna to talk with PCP about program support. Discussed insurance of client. She has [place] and Medicaid. Discussed fall risk of client. Client said she has fallen 5 times in past month. She said her legs feel weak and legs give way some time.She has problem getting back up and down. Uses walker. She has pain in her knees.  She uses elevator frequently in building.   Discussed Transport needs. She said she uses [employer] with lift on van to help her go to and from medical appointments. Reviewed medications procurement of client.   Reviewed vision of client. She said she has vision decrease. Client said she has difficulty standing up.   Client eats sandwiches often. Uses microwave to warm up food.  Discussed family support. She said she has limited family  support Provided counseling support for client. Discussed relaxation techniques with client Client and LCSW spoke of her cancer treatments. She spoke of cancer treatments and was very fatigued after cancer treatments. She said she has had 10 cancer treatments.  Discussed her feelings about managing cancer treatments. She said she thinks she is scheduled for a type of scan soon .  Discussed art therapy.  She likes to do art drawing if able and has done art drawing in the past.  She tries to take naps as part of relaxation approach.   Discussed finances of client.  Client said she has Representative Payee that helps her manage client finances. Client has difficulty getting up and getting to door at her apartment. It takes her a little while to get to her door.   Talked with client about ADLs completion.  She takes sink bath regularly to help with bathing Client spoke of her cancer treatments (10 treatments received).   Reviewed sleeping challenges. She has sleeping difficulty Talked of her next appointment at office of PCP on April 11 2022. Client said she would like RN with Care Coordination program to call her to discuss her nursing needs.  Discussed dental needs of client. She eats soft foods. She has difficulty paying copays for medical or dental visits. LCSW to schedule RN  phone visit with client LCSW  Theadore Nan to schedule client phone visit with Nat Christen who is designated LCSW for client going forward         Our next appointment is by telephone on 05/10/2021 at 9:00 am.  Please call the care guide team at [phone] if you need to cancel or reschedule your appointment.   If you are experiencing a Mental Health or [place] or need someone to talk to, please call the Suicide and Crisis Lifeline: 988 call the [place] National Suicide Prevention Lifeline: 671-633-3511 or TTY: (843)099-3675 TTY (207)154-4837) to talk to a trained counselor call 1-800-273-TALK  (toll free, 24 hour hotline) go to [facility] [place] [phone]) call the [place]: [phone] call 911  Patient verbalizes understanding of instructions and care plan provided today and agrees to view in [place]. Active MyChart status and patient understanding of how to access instructions and care plan via MyChart confirmed with patient.     Telephone follow up appointment with care management team member scheduled for:  05/10/2021 at 9:00 am.  Nat Christen, BSW, MSW, [place]  Licensed Clinical Social Worker  [place]  Mailing [place]. [address] [place], [place] [address] Physical [address], [place], [place] [address] Toll Free Main # [phone] Fax # [phone] Cell # [phone] [email or web address]

## 2022-04-22 NOTE — Patient Instructions (Incomplete)
Visit Information  Thank you for taking time to visit with me today. Please don't hesitate to contact me if I can be of assistance to you.   Following are the goals we discussed today:   Goals Addressed               This Visit's Progress     Patient Stated     Manage treatments for thyroid cancer & rehab at Facility [facility]) (pt-stated)   Not on track     Care Coordination Interventions: Assessed patient understanding of cancer diagnosis and recommended treatment plan Assessed support system. Has consistent/reliable family or other support: Yes Outreach to [employer] and reviewed with her the concerns Ms Marrs stated to RN CM about her pain, depression and limited ability to tolerate the facility therapies at this time.   Chrys Racer shared with RN CM the insurance requirements for Ms Egner to be covered for facility therapy.  RN CM acknowledged  these standard requirements. Shared with Chrys Racer that RN CM was advocating the patient's voiced concerns. RN CM inquired if passive range of motion therapy was a possibility for the patient to have after returning from her oncology treatments to compromise with her. Chrys Racer voiced not being sure if this would occur as she is "not a physical therapist" Confirmed with patient that she remains active with [facility] SW for assistance with her depression  Notes to be forward to pcp         Our next appointment is by telephone on 05/05/22 at ***  Please call the care guide team at [phone] if you need to cancel or reschedule your appointment.   If you are experiencing a Mental Health or [place] or need someone to talk to, please call the Suicide and Crisis Lifeline: 988 call the [place] National Suicide Prevention Lifeline: 580-228-6871 or TTY: (860)161-1882 TTY 231-653-0172) to talk to a trained counselor call 1-800-273-TALK (toll free, 24 hour hotline) call the [facility]: ([phone] call  911   The patient verbalized understanding of instructions, educational materials, and care plan provided today and DECLINED offer to receive copy of patient instructions, educational materials, and care plan.   The patient has been provided with contact information for the care management team and has been advised to call with any health related questions or concerns.   Cathlyn Tersigni L. Lavina Hamman, RN, BSN, [place] Coordinator Office number [phone]

## 2022-04-25 ENCOUNTER — Ambulatory Visit: Payer: Medicare Other | Admitting: "Endocrinology

## 2022-04-26 ENCOUNTER — Ambulatory Visit: Payer: Medicare Other | Admitting: "Endocrinology

## 2022-05-04 ENCOUNTER — Ambulatory Visit: Payer: Self-pay | Admitting: *Deleted

## 2022-05-04 NOTE — Patient Outreach (Signed)
  Care Coordination   [facility] case closure  Visit Note   05/04/2022 Name: Kristin Wells MRN: [id number] DOB: 05-20-1950  Kristin Wells is a 71 y.o. year old female who sees Nevada Crane, Edwinna Areola, MD for primary care. I  am completing case closure EPIC indicates patient passed on 2022/05/17  What matters to the patients health and wellness today?  patient passed on 2022/05/17- Case closure for [facility] RN CM & SW    Goals Addressed               This Visit's Progress     Patient Stated     COMPLETED: Assist with [place]. (pt-stated)        Care Coordination Interventions:  Case closure patient passed on 05/17/22      COMPLETED: Manage treatments for thyroid cancer & rehab at [facility]) (pt-stated)        Care Coordination Interventions:  Patient passed on 2022/05/17 [facility] case closure         SDOH assessments and interventions completed:  No     Care Coordination Interventions:  No, not indicated   Follow up plan: No further intervention required.   Encounter Outcome:  Pt. Visit Completed   Geet Hosking L. Lavina Hamman, RN, BSN, [place] Coordinator Office number [phone]

## 2022-05-05 DIAGNOSIS — J9690 Respiratory failure, unspecified, unspecified whether with hypoxia or hypercapnia: Secondary | ICD-10-CM | POA: Diagnosis not present

## 2022-05-05 DIAGNOSIS — J449 Chronic obstructive pulmonary disease, unspecified: Secondary | ICD-10-CM | POA: Diagnosis not present

## 2022-05-05 DIAGNOSIS — M47897 Other spondylosis, lumbosacral region: Secondary | ICD-10-CM | POA: Diagnosis not present

## 2022-05-09 DEATH — deceased

## 2022-05-10 ENCOUNTER — Encounter: Payer: Medicare Other | Admitting: *Deleted

## 2022-05-12 ENCOUNTER — Ambulatory Visit: Payer: Medicare Other | Admitting: "Endocrinology

## 2023-07-19 IMAGING — DX DG CHEST 2V
2 series · 2 of 2 positions shown · non-contrast
Comparison: 05/13/2017

CLINICAL DATA: 70-year-old female with preoperative chest x-ray

EXAM:
CHEST - 2 VIEW

[chest pa]
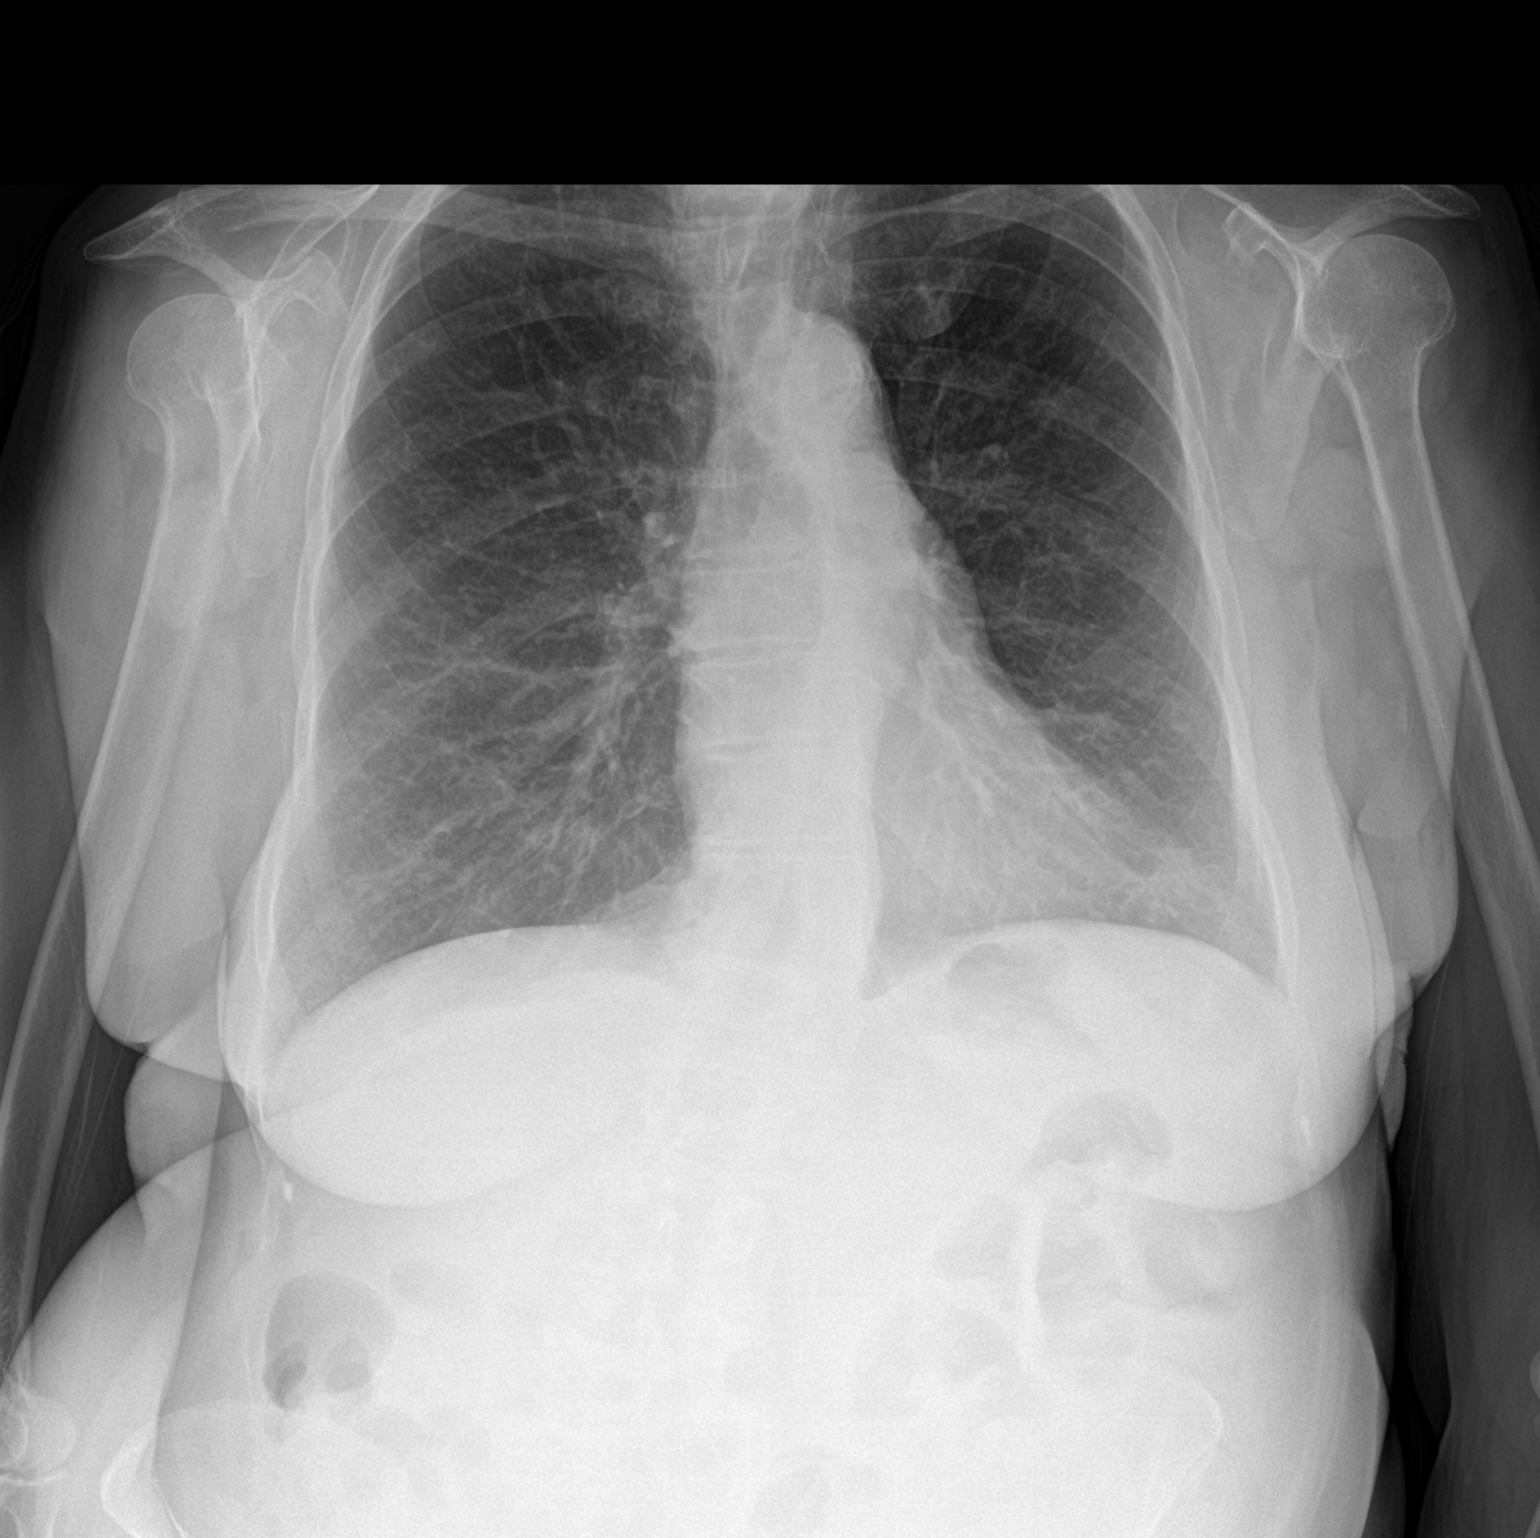

[chest lat]
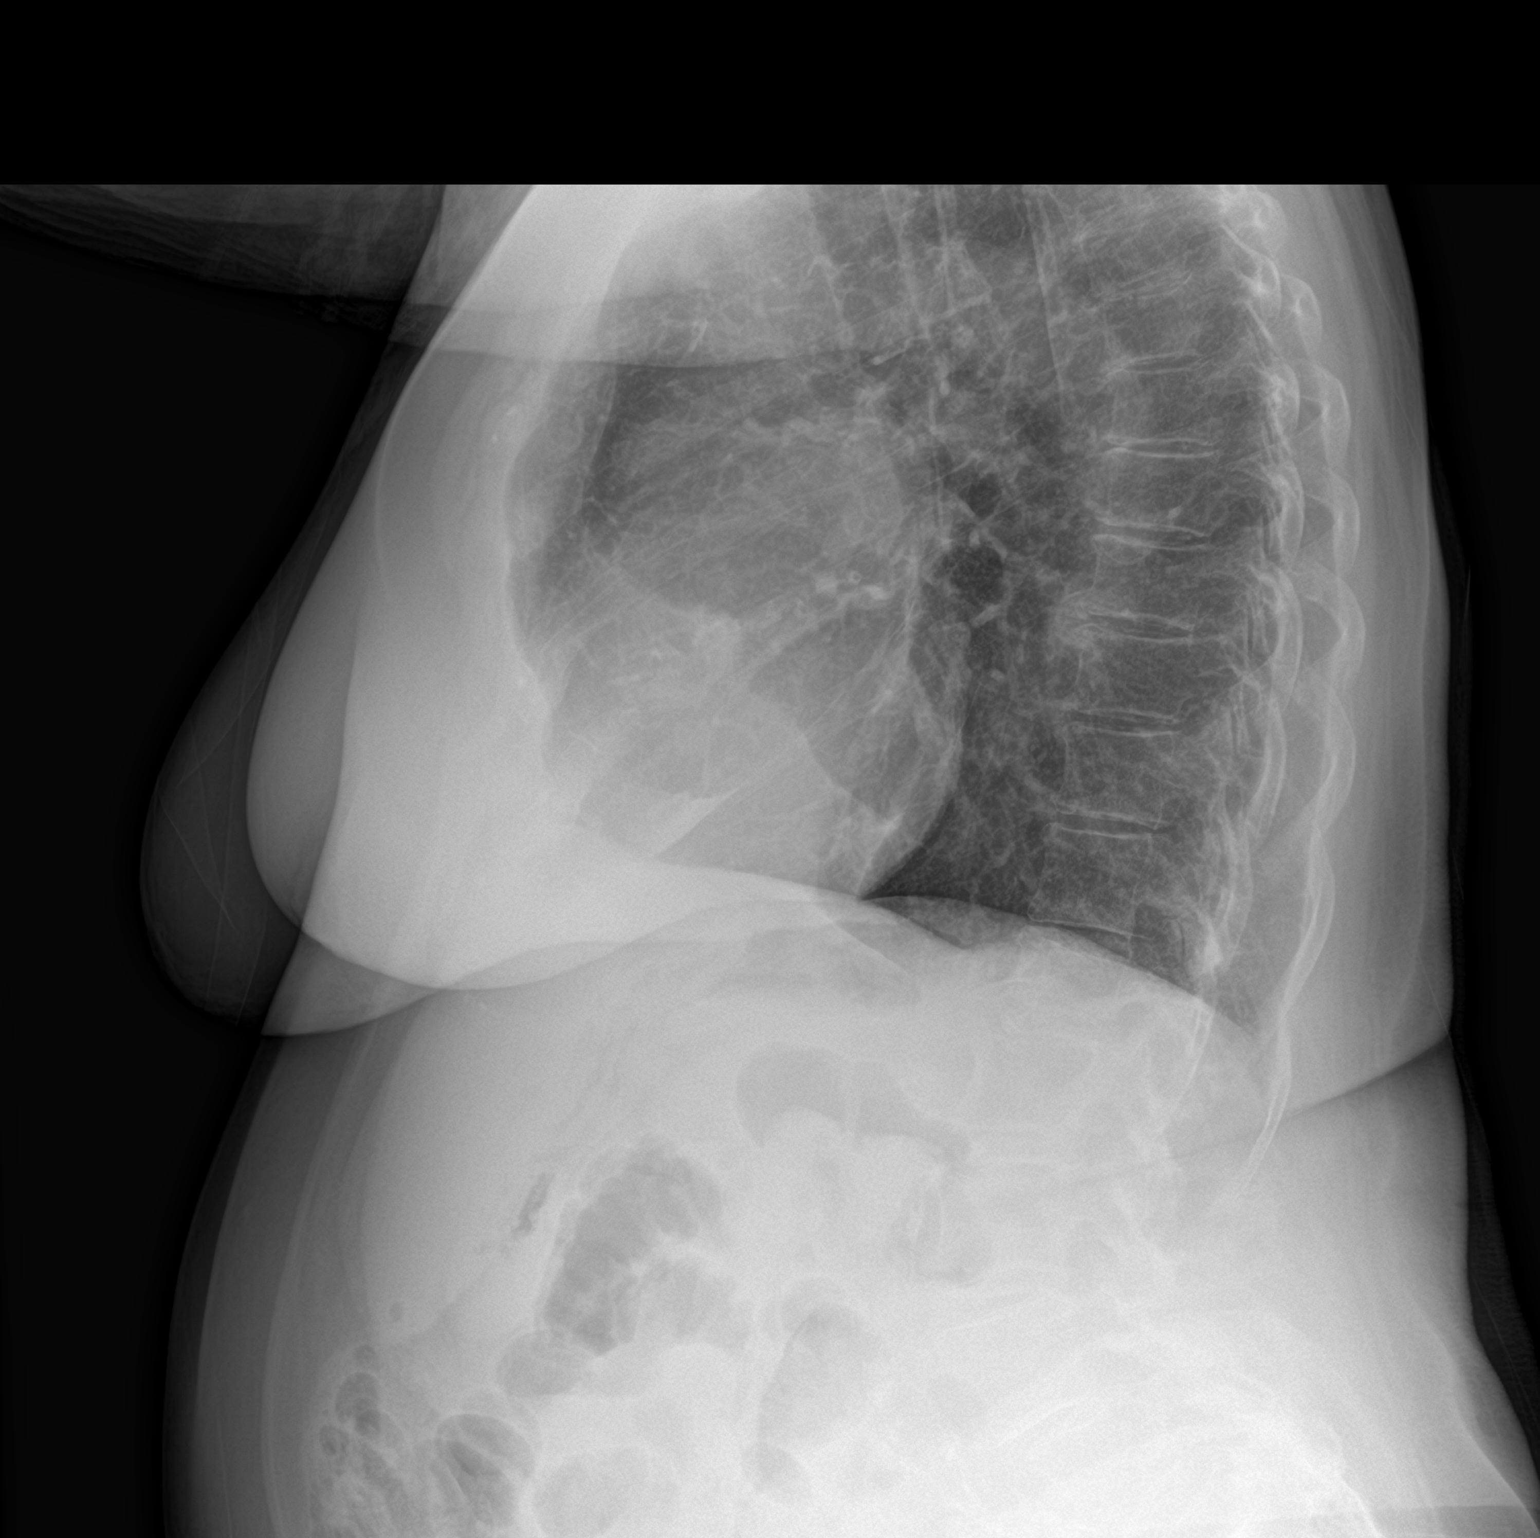

[2 of 2 positions shown; findings below may reference images not displayed]

FINDINGS: Cardiomediastinal silhouette unchanged in size and contour. No
evidence of central vascular congestion. No interlobular septal
thickening.

No pneumothorax or pleural effusion. Coarsened interstitial
markings, with no confluent airspace disease.

No acute displaced fracture. Degenerative changes of the spine.
IMPRESSION: No active cardiopulmonary disease.
